# Patient Record
Sex: Female | Born: 1945 | Race: White | Hispanic: No | Smoking: Never smoker
Health system: Southern US, Community
[De-identification: ages and names within clinical notes are randomized; demographics above are authoritative.]

## PROBLEM LIST (undated history)

## (undated) DIAGNOSIS — I10 Essential (primary) hypertension: Secondary | ICD-10-CM

## (undated) DIAGNOSIS — F0390 Unspecified dementia without behavioral disturbance: Secondary | ICD-10-CM

## (undated) DIAGNOSIS — M5410 Radiculopathy, site unspecified: Secondary | ICD-10-CM

## (undated) DIAGNOSIS — G919 Hydrocephalus, unspecified: Secondary | ICD-10-CM

## (undated) DIAGNOSIS — M1990 Unspecified osteoarthritis, unspecified site: Secondary | ICD-10-CM

## (undated) DIAGNOSIS — N3281 Overactive bladder: Secondary | ICD-10-CM

## (undated) DIAGNOSIS — R45851 Suicidal ideations: Secondary | ICD-10-CM

## (undated) DIAGNOSIS — G4700 Insomnia, unspecified: Secondary | ICD-10-CM

## (undated) DIAGNOSIS — F419 Anxiety disorder, unspecified: Secondary | ICD-10-CM

## (undated) DIAGNOSIS — E538 Deficiency of other specified B group vitamins: Secondary | ICD-10-CM

## (undated) DIAGNOSIS — E559 Vitamin D deficiency, unspecified: Secondary | ICD-10-CM

## (undated) DIAGNOSIS — E785 Hyperlipidemia, unspecified: Secondary | ICD-10-CM

## (undated) DIAGNOSIS — E119 Type 2 diabetes mellitus without complications: Secondary | ICD-10-CM

## (undated) DIAGNOSIS — R001 Bradycardia, unspecified: Secondary | ICD-10-CM

## (undated) DIAGNOSIS — W19XXXA Unspecified fall, initial encounter: Secondary | ICD-10-CM

## (undated) DIAGNOSIS — J42 Unspecified chronic bronchitis: Secondary | ICD-10-CM

## (undated) DIAGNOSIS — I341 Nonrheumatic mitral (valve) prolapse: Secondary | ICD-10-CM

## (undated) DIAGNOSIS — R296 Repeated falls: Secondary | ICD-10-CM

## (undated) DIAGNOSIS — E7800 Pure hypercholesterolemia, unspecified: Secondary | ICD-10-CM

## (undated) DIAGNOSIS — I959 Hypotension, unspecified: Secondary | ICD-10-CM

## (undated) DIAGNOSIS — R42 Dizziness and giddiness: Secondary | ICD-10-CM

## (undated) DIAGNOSIS — K219 Gastro-esophageal reflux disease without esophagitis: Secondary | ICD-10-CM

## (undated) DIAGNOSIS — F329 Major depressive disorder, single episode, unspecified: Secondary | ICD-10-CM

## (undated) DIAGNOSIS — F32A Depression, unspecified: Secondary | ICD-10-CM

## (undated) DIAGNOSIS — R7401 Elevation of levels of liver transaminase levels: Secondary | ICD-10-CM

## (undated) DIAGNOSIS — R740 Nonspecific elevation of levels of transaminase and lactic acid dehydrogenase [LDH]: Secondary | ICD-10-CM

## (undated) DIAGNOSIS — N179 Acute kidney failure, unspecified: Secondary | ICD-10-CM

## (undated) HISTORY — DX: Major depressive disorder, single episode, unspecified: F32.9

## (undated) HISTORY — DX: Depression, unspecified: F32.A

## (undated) HISTORY — DX: Pure hypercholesterolemia, unspecified: E78.00

## (undated) HISTORY — DX: Unspecified chronic bronchitis: J42

## (undated) HISTORY — PX: VENTRICULOPERITONEAL SHUNT: SHX204

## (undated) HISTORY — DX: Anxiety disorder, unspecified: F41.9

## (undated) HISTORY — PX: CHOLECYSTECTOMY: SHX55

## (undated) HISTORY — DX: Essential (primary) hypertension: I10

## (undated) HISTORY — DX: Type 2 diabetes mellitus without complications: E11.9

## (undated) HISTORY — PX: BREAST BIOPSY: SHX20

## (undated) HISTORY — PX: BRAIN SURGERY: SHX531

## (undated) HISTORY — DX: Vitamin D deficiency, unspecified: E55.9

## (undated) HISTORY — DX: Unspecified osteoarthritis, unspecified site: M19.90

## (undated) HISTORY — DX: Insomnia, unspecified: G47.00

## (undated) HISTORY — DX: Hyperlipidemia, unspecified: E78.5

## (undated) HISTORY — DX: Unspecified dementia, unspecified severity, without behavioral disturbance, psychotic disturbance, mood disturbance, and anxiety: F03.90

## (undated) HISTORY — DX: Overactive bladder: N32.81

## (undated) HISTORY — DX: Suicidal ideations: R45.851

## (undated) HISTORY — DX: Deficiency of other specified B group vitamins: E53.8

## (undated) HISTORY — DX: Hydrocephalus, unspecified: G91.9

## (undated) HISTORY — DX: Radiculopathy, site unspecified: M54.10

---

## 2001-02-27 ENCOUNTER — Encounter: Payer: Self-pay | Admitting: Family Medicine

## 2001-02-27 ENCOUNTER — Encounter: Admission: RE | Admit: 2001-02-27 | Discharge: 2001-02-27 | Payer: Self-pay | Admitting: Family Medicine

## 2004-04-07 ENCOUNTER — Emergency Department (HOSPITAL_COMMUNITY): Admission: EM | Admit: 2004-04-07 | Discharge: 2004-04-08 | Payer: Self-pay | Admitting: Emergency Medicine

## 2004-04-08 ENCOUNTER — Inpatient Hospital Stay (HOSPITAL_COMMUNITY): Admission: EM | Admit: 2004-04-08 | Discharge: 2004-04-13 | Payer: Self-pay | Admitting: Emergency Medicine

## 2004-04-16 ENCOUNTER — Encounter: Admission: RE | Admit: 2004-04-16 | Discharge: 2004-04-16 | Payer: Self-pay | Admitting: Gastroenterology

## 2004-05-14 ENCOUNTER — Encounter (INDEPENDENT_AMBULATORY_CARE_PROVIDER_SITE_OTHER): Payer: Self-pay | Admitting: *Deleted

## 2004-05-14 ENCOUNTER — Ambulatory Visit (HOSPITAL_COMMUNITY): Admission: RE | Admit: 2004-05-14 | Discharge: 2004-05-17 | Payer: Self-pay | Admitting: General Surgery

## 2004-06-19 ENCOUNTER — Other Ambulatory Visit: Admission: RE | Admit: 2004-06-19 | Discharge: 2004-06-19 | Payer: Self-pay | Admitting: Obstetrics & Gynecology

## 2004-06-24 ENCOUNTER — Encounter: Admission: RE | Admit: 2004-06-24 | Discharge: 2004-06-24 | Payer: Self-pay | Admitting: Obstetrics & Gynecology

## 2004-08-17 ENCOUNTER — Ambulatory Visit (HOSPITAL_COMMUNITY): Admission: RE | Admit: 2004-08-17 | Discharge: 2004-08-17 | Payer: Self-pay | Admitting: Gastroenterology

## 2004-08-21 ENCOUNTER — Observation Stay (HOSPITAL_COMMUNITY): Admission: EM | Admit: 2004-08-21 | Discharge: 2004-08-22 | Payer: Self-pay | Admitting: Emergency Medicine

## 2004-08-21 ENCOUNTER — Ambulatory Visit: Payer: Self-pay | Admitting: Internal Medicine

## 2004-08-31 ENCOUNTER — Ambulatory Visit (HOSPITAL_COMMUNITY): Admission: RE | Admit: 2004-08-31 | Discharge: 2004-08-31 | Payer: Self-pay | Admitting: Gastroenterology

## 2004-09-09 ENCOUNTER — Encounter: Admission: RE | Admit: 2004-09-09 | Discharge: 2004-09-09 | Payer: Self-pay | Admitting: Gastroenterology

## 2004-10-01 LAB — HM DEXA SCAN

## 2005-07-05 ENCOUNTER — Other Ambulatory Visit: Admission: RE | Admit: 2005-07-05 | Discharge: 2005-07-05 | Payer: Self-pay | Admitting: Obstetrics & Gynecology

## 2007-07-03 LAB — CONVERTED CEMR LAB

## 2007-08-24 LAB — HM PAP SMEAR: HM Pap smear: NORMAL

## 2007-11-06 ENCOUNTER — Other Ambulatory Visit (HOSPITAL_COMMUNITY): Admission: RE | Admit: 2007-11-06 | Discharge: 2007-12-01 | Payer: Self-pay | Admitting: Psychiatry

## 2007-11-08 ENCOUNTER — Ambulatory Visit: Payer: Self-pay | Admitting: Psychiatry

## 2008-02-03 ENCOUNTER — Emergency Department (HOSPITAL_COMMUNITY): Admission: EM | Admit: 2008-02-03 | Discharge: 2008-02-03 | Payer: Self-pay | Admitting: Emergency Medicine

## 2008-04-10 ENCOUNTER — Emergency Department (HOSPITAL_COMMUNITY): Admission: EM | Admit: 2008-04-10 | Discharge: 2008-04-11 | Payer: Self-pay | Admitting: Emergency Medicine

## 2008-04-11 ENCOUNTER — Inpatient Hospital Stay (HOSPITAL_COMMUNITY): Admission: EM | Admit: 2008-04-11 | Discharge: 2008-04-13 | Payer: Self-pay | Admitting: Emergency Medicine

## 2008-05-15 ENCOUNTER — Ambulatory Visit: Payer: Self-pay | Admitting: Internal Medicine

## 2008-05-15 DIAGNOSIS — F3289 Other specified depressive episodes: Secondary | ICD-10-CM | POA: Insufficient documentation

## 2008-05-15 DIAGNOSIS — F329 Major depressive disorder, single episode, unspecified: Secondary | ICD-10-CM | POA: Insufficient documentation

## 2008-05-15 DIAGNOSIS — I10 Essential (primary) hypertension: Secondary | ICD-10-CM | POA: Insufficient documentation

## 2008-05-15 DIAGNOSIS — F419 Anxiety disorder, unspecified: Secondary | ICD-10-CM | POA: Insufficient documentation

## 2008-05-20 ENCOUNTER — Telehealth: Payer: Self-pay | Admitting: Internal Medicine

## 2009-01-25 ENCOUNTER — Ambulatory Visit: Payer: Self-pay | Admitting: Internal Medicine

## 2009-01-25 ENCOUNTER — Observation Stay (HOSPITAL_COMMUNITY): Admission: EM | Admit: 2009-01-25 | Discharge: 2009-01-26 | Payer: Self-pay | Admitting: Emergency Medicine

## 2009-04-17 ENCOUNTER — Emergency Department (HOSPITAL_COMMUNITY): Admission: EM | Admit: 2009-04-17 | Discharge: 2009-04-17 | Payer: Self-pay | Admitting: Emergency Medicine

## 2009-04-25 ENCOUNTER — Emergency Department (HOSPITAL_COMMUNITY): Admission: EM | Admit: 2009-04-25 | Discharge: 2009-04-25 | Payer: Self-pay | Admitting: Emergency Medicine

## 2009-04-27 ENCOUNTER — Emergency Department (HOSPITAL_COMMUNITY): Admission: EM | Admit: 2009-04-27 | Discharge: 2009-04-28 | Payer: Self-pay | Admitting: Emergency Medicine

## 2009-04-28 ENCOUNTER — Inpatient Hospital Stay (HOSPITAL_COMMUNITY): Admission: RE | Admit: 2009-04-28 | Discharge: 2009-04-29 | Payer: Self-pay | Admitting: Psychiatry

## 2009-04-28 ENCOUNTER — Ambulatory Visit: Payer: Self-pay | Admitting: Psychiatry

## 2009-04-28 ENCOUNTER — Emergency Department (HOSPITAL_COMMUNITY): Admission: EM | Admit: 2009-04-28 | Discharge: 2009-04-28 | Payer: Self-pay | Admitting: Emergency Medicine

## 2009-04-30 ENCOUNTER — Inpatient Hospital Stay (HOSPITAL_COMMUNITY): Admission: AD | Admit: 2009-04-30 | Discharge: 2009-05-08 | Payer: Self-pay | Admitting: Psychiatry

## 2009-05-09 ENCOUNTER — Other Ambulatory Visit (HOSPITAL_COMMUNITY): Admission: RE | Admit: 2009-05-09 | Discharge: 2009-05-22 | Payer: Self-pay | Admitting: Psychiatry

## 2009-05-14 ENCOUNTER — Ambulatory Visit: Payer: Self-pay | Admitting: Psychiatry

## 2009-07-22 ENCOUNTER — Emergency Department (HOSPITAL_COMMUNITY): Admission: EM | Admit: 2009-07-22 | Discharge: 2009-07-22 | Payer: Self-pay | Admitting: Emergency Medicine

## 2009-11-26 ENCOUNTER — Emergency Department (HOSPITAL_COMMUNITY): Admission: EM | Admit: 2009-11-26 | Discharge: 2009-11-27 | Payer: Self-pay | Admitting: Emergency Medicine

## 2009-12-01 ENCOUNTER — Inpatient Hospital Stay (HOSPITAL_COMMUNITY): Admission: RE | Admit: 2009-12-01 | Discharge: 2009-12-04 | Payer: Self-pay | Admitting: Orthopedic Surgery

## 2010-05-22 ENCOUNTER — Telehealth: Payer: Self-pay | Admitting: Internal Medicine

## 2010-05-22 ENCOUNTER — Ambulatory Visit: Payer: Self-pay | Admitting: Internal Medicine

## 2010-05-22 ENCOUNTER — Encounter: Payer: Self-pay | Admitting: Internal Medicine

## 2010-05-22 DIAGNOSIS — R55 Syncope and collapse: Secondary | ICD-10-CM | POA: Insufficient documentation

## 2010-05-22 DIAGNOSIS — R279 Unspecified lack of coordination: Secondary | ICD-10-CM | POA: Insufficient documentation

## 2010-05-22 LAB — CONVERTED CEMR LAB
ALT: 17 units/L (ref 0–35)
AST: 19 units/L (ref 0–37)
Albumin: 3.7 g/dL (ref 3.5–5.2)
Alkaline Phosphatase: 91 units/L (ref 39–117)
BUN: 18 mg/dL (ref 6–23)
Basophils Absolute: 0.1 10*3/uL (ref 0.0–0.1)
Basophils Relative: 0.8 % (ref 0.0–3.0)
Bilirubin Urine: NEGATIVE
Bilirubin, Direct: 0.1 mg/dL (ref 0.0–0.3)
CO2: 30 meq/L (ref 19–32)
Calcium: 9.2 mg/dL (ref 8.4–10.5)
Chloride: 103 meq/L (ref 96–112)
Creatinine, Ser: 0.7 mg/dL (ref 0.4–1.2)
Eosinophils Absolute: 0.2 10*3/uL (ref 0.0–0.7)
Eosinophils Relative: 3.4 % (ref 0.0–5.0)
Folate: 6.3 ng/mL
GFR calc non Af Amer: 91.25 mL/min (ref >60)
Glucose, Bld: 116 mg/dL — ABNORMAL HIGH (ref 70–99)
HCT: 43.6 % (ref 36.0–46.0)
Hemoglobin, Urine: NEGATIVE
Hemoglobin: 15.3 g/dL — ABNORMAL HIGH (ref 12.0–15.0)
Ketones, ur: NEGATIVE mg/dL
Leukocytes, UA: NEGATIVE
Lymphocytes Relative: 20.9 % (ref 12.0–46.0)
Lymphs Abs: 1.5 10*3/uL (ref 0.7–4.0)
MCHC: 35.1 g/dL (ref 30.0–36.0)
MCV: 90.7 fL (ref 78.0–100.0)
Monocytes Absolute: 0.6 10*3/uL (ref 0.1–1.0)
Monocytes Relative: 8 % (ref 3.0–12.0)
Neutro Abs: 4.8 10*3/uL (ref 1.4–7.7)
Neutrophils Relative %: 66.9 % (ref 43.0–77.0)
Nitrite: NEGATIVE
Platelets: 269 10*3/uL (ref 150.0–400.0)
Potassium: 4.3 meq/L (ref 3.5–5.1)
RBC: 4.81 M/uL (ref 3.87–5.11)
RDW: 12.8 % (ref 11.5–14.6)
Sodium: 141 meq/L (ref 135–145)
Specific Gravity, Urine: 1.02 (ref 1.000–1.030)
TSH: 2.86 microintl units/mL (ref 0.35–5.50)
Total Bilirubin: 0.4 mg/dL (ref 0.3–1.2)
Total Protein, Urine: NEGATIVE mg/dL
Total Protein: 7 g/dL (ref 6.0–8.3)
Urine Glucose: NEGATIVE mg/dL
Urobilinogen, UA: 0.2 (ref 0.0–1.0)
Vitamin B-12: 172 pg/mL — ABNORMAL LOW (ref 211–911)
WBC: 7.2 10*3/uL (ref 4.5–10.5)
pH: 6 (ref 5.0–8.0)

## 2010-05-25 ENCOUNTER — Telehealth: Payer: Self-pay | Admitting: Internal Medicine

## 2010-06-01 DIAGNOSIS — K869 Disease of pancreas, unspecified: Secondary | ICD-10-CM | POA: Insufficient documentation

## 2010-06-01 DIAGNOSIS — F410 Panic disorder [episodic paroxysmal anxiety] without agoraphobia: Secondary | ICD-10-CM | POA: Insufficient documentation

## 2010-06-01 DIAGNOSIS — R079 Chest pain, unspecified: Secondary | ICD-10-CM | POA: Insufficient documentation

## 2010-06-02 ENCOUNTER — Ambulatory Visit: Payer: Self-pay | Admitting: Cardiovascular Disease

## 2010-06-02 DIAGNOSIS — R9431 Abnormal electrocardiogram [ECG] [EKG]: Secondary | ICD-10-CM | POA: Insufficient documentation

## 2010-06-03 ENCOUNTER — Telehealth: Payer: Self-pay | Admitting: Internal Medicine

## 2010-06-03 ENCOUNTER — Ambulatory Visit (HOSPITAL_COMMUNITY): Admission: RE | Admit: 2010-06-03 | Discharge: 2010-06-03 | Payer: Self-pay | Admitting: Internal Medicine

## 2010-06-03 DIAGNOSIS — G910 Communicating hydrocephalus: Secondary | ICD-10-CM | POA: Insufficient documentation

## 2010-06-05 ENCOUNTER — Ambulatory Visit: Payer: Self-pay | Admitting: Internal Medicine

## 2010-06-05 DIAGNOSIS — D519 Vitamin B12 deficiency anemia, unspecified: Secondary | ICD-10-CM | POA: Insufficient documentation

## 2010-06-10 ENCOUNTER — Encounter: Payer: Self-pay | Admitting: Internal Medicine

## 2010-06-12 ENCOUNTER — Encounter: Payer: Self-pay | Admitting: Internal Medicine

## 2010-06-16 ENCOUNTER — Ambulatory Visit (HOSPITAL_COMMUNITY): Admission: RE | Admit: 2010-06-16 | Discharge: 2010-06-16 | Payer: Self-pay | Admitting: Cardiology

## 2010-06-19 ENCOUNTER — Ambulatory Visit: Payer: Self-pay | Admitting: Internal Medicine

## 2010-06-29 ENCOUNTER — Telehealth: Payer: Self-pay | Admitting: Internal Medicine

## 2010-07-06 ENCOUNTER — Inpatient Hospital Stay (HOSPITAL_COMMUNITY)
Admission: RE | Admit: 2010-07-06 | Discharge: 2010-07-08 | Payer: Self-pay | Source: Home / Self Care | Attending: Neurosurgery | Admitting: Neurosurgery

## 2010-07-17 ENCOUNTER — Encounter: Payer: Self-pay | Admitting: Internal Medicine

## 2010-08-11 ENCOUNTER — Encounter: Payer: Self-pay | Admitting: Internal Medicine

## 2010-08-22 ENCOUNTER — Encounter: Payer: Self-pay | Admitting: Gastroenterology

## 2010-08-22 ENCOUNTER — Other Ambulatory Visit: Payer: Self-pay | Admitting: Neurosurgery

## 2010-08-22 DIAGNOSIS — G919 Hydrocephalus, unspecified: Secondary | ICD-10-CM

## 2010-09-03 NOTE — Assessment & Plan Note (Signed)
Summary: b12 shot / [name] / cd  Nurse Visit   Allergies: 1)  ! Pcn 2)  ! Erythromycin 3)  ! Amitriptyline Hcl  Medication Administration  Injection # 1:    Medication: Vit B12 1000 mcg    Diagnosis: VITAMIN B12 DEFICIENCY (ICD-266.2)    Route: IM    Site: R deltoid    Exp Date: 11/01/2011    Lot #: [id number]    Mfr: American Regent    Patient tolerated injection without complications    Given by: Lanier Prude, CMA(AAMA) (June 19, 2010 1:18 PM)  Orders Added: 1)  Vit B12 1000 mcg [J3420] 2)  Admin of Therapeutic Inj  intramuscular or subcutaneous [16109]

## 2010-09-03 NOTE — Letter (Signed)
Summary: [facility]  [facility]   Imported By: Lester Fruitland 07/31/2010 07:23:55  _____________________________________________________________________  External Attachment:    Type:   Image     Comment:   External Document

## 2010-09-03 NOTE — Assessment & Plan Note (Signed)
Summary: discuss test results/#/cd   Vital Signs:  Patient profile:   65 year old female Menstrual status:  postmenopausal Height:      67 inches Weight:      252 pounds BMI:     39.61 O2 Sat:      97 % on Room air Temp:     97.8 degrees F oral Pulse rate:   90 / minute Pulse rhythm:   regular Resp:     16 per minute BP sitting:   124 / 80  (left arm) Cuff size:   large  Vitals Entered By: Rock Nephew CMA (June 05, 2010 11:22 AM)  Nutrition Counseling: Patient's BMI is greater than 25 and therefore counseled on weight management options.  O2 Flow:  Room air CC: follow-up visit/// test results Is Patient Diabetic? No Pain Assessment Patient in pain? no       Does patient need assistance? Functional Status Self care Ambulation Normal   Primary Care Provider:  Etta Grandchild MD  CC:  follow-up visit/// test results.  History of Present Illness: She returns for f/up and to discusss her results. 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 She conintinues to have ataxia and feels off balance.  Preventive Screening-Counseling & Management  Alcohol-Tobacco     Alcohol drinks/day: 0     Alcohol Counseling: not indicated; patient does not drink     Smoking Status: never     Passive Smoke Exposure: no     Tobacco Counseling: not indicated; no tobacco use  Hep-HIV-STD-Contraception     Hepatitis Risk: no risk noted     HIV Risk: no risk noted     STD Risk: no risk noted      Sexual History:  not active.        Drug Use:  never.        Blood Transfusions:  no.    Clinical Review Panels:  Prevention   Last Mammogram:  done (10/01/2007)   Last Pap Smear:  done (07/03/2007)  Immunizations   Last Tetanus Booster:  Tdap (05/22/2010)   Last Flu Vaccine:  Fluvax 3+ (05/22/2010)  Diabetes Management   Creatinine:  0.7 (05/22/2010)   Last Flu Vaccine:  Fluvax 3+ (05/22/2010)  CBC   WBC:  7.2 (05/22/2010)   RBC:   4.81 (05/22/2010)   Hgb:  15.3 (05/22/2010)   Hct:  43.6 (05/22/2010)   Platelets:  269.0 (05/22/2010)   MCV  90.7 (05/22/2010)   MCHC  35.1 (05/22/2010)   RDW  12.8 (05/22/2010)   PMN:  66.9 (05/22/2010)   Lymphs:  20.9 (05/22/2010)   Monos:  8.0 (05/22/2010)   Eosinophils:  3.4 (05/22/2010)   Basophil:  0.8 (05/22/2010)  Complete Metabolic Panel   Glucose:  116 (05/22/2010)   Sodium:  141 (05/22/2010)   Potassium:  4.3 (05/22/2010)   Chloride:  103 (05/22/2010)   CO2:  30 (05/22/2010)   BUN:  18 (05/22/2010)   Creatinine:  0.7 (05/22/2010)   Albumin:  3.7 (05/22/2010)   Total Protein:  7.0 (05/22/2010)   Calcium:  9.2 (05/22/2010)   Total Bili:  0.4 (05/22/2010)   Alk Phos:  91 (05/22/2010)   SGPT (ALT):  17 (05/22/2010)   SGOT (AST):  19 (05/22/2010)   Medications Prior to Update: 1)  Lorazepam 3  Mg Tabs (Lorazepam) .... Take 1 Tablet By Mouth Once A Day 2)  Viibryd 10 Mg Tabs (Vilazodone Hcl) .... 10mg   By Mouth Weekly 3)  Diovan 320 Mg Tabs (Valsartan) .... One By Mouth Once Daily For High Blood Pressure 4)  Ibuprofen .... As Needed  Current Medications (verified): 1)  Lorazepam 1 Mg Tabs (Lorazepam) .... Take 1 Tablet By Mouth Three Times A Day 2)  Viibryd 40 Mg Tabs (Vilazodone Hcl) .... Take 1 Tablet By Mouth Once A Day 3)  Diovan 320 Mg Tabs (Valsartan) .... One By Mouth Once Daily For High Blood Pressure 4)  Ibuprofen .... As Needed 5)  Fiorinal/codeine #3 50-325-40-30 Mg Caps (Butalbital-Asa-Caff-Codeine) .... As Needed  Allergies (verified): 1)  ! Pcn 2)  ! Erythromycin 3)  ! Amitriptyline Hcl  Past History:  Past Medical History: Last updated: 06/01/2010 CHEST PAIN HYPERTENSION  GALLSTONE PANCREATITIS  PANIC ATTACK  SYNCOPE ATAXIA  FAMILY HISTORY DIABETES 1ST DEGREE RELATIVE  FAMILY HISTORY OF ALCOHOLISM/ADDICTION  ANXIETY  Depression Dr Evelene Croon  Past Surgical History: Last updated: 06/01/2010 Cholecystectomy  Right wrist intraarticular  distal radius fracture.   Family History: Last updated: 05/15/2008 Family History of Alcoholism/Addiction Family History Diabetes 1st degree relative Family History Hypertension Family History of Stroke M 1st degree relative <50 Family History of Suicide attempt  Social History: Last updated: 05/15/2008 Retired Divorced Never Smoked Alcohol use-no Drug use-no Regular exercise-no  Risk Factors: Alcohol Use: 0 (06/05/2010) Exercise: no (05/15/2008)  Risk Factors: Smoking Status: never (06/05/2010) Passive Smoke Exposure: no (06/05/2010)  Family History: Reviewed history from 05/15/2008 and no changes required. Family History of Alcoholism/Addiction Family History Diabetes 1st degree relative Family History Hypertension Family History of Stroke M 1st degree relative <50 Family History of Suicide attempt  Social History: Reviewed history from 05/15/2008 and no changes required. Retired Divorced Never Smoked Alcohol use-no Drug use-no Regular exercise-no  Review of Systems       The patient complains of weight gain, syncope, headaches, and difficulty walking.  The patient denies anorexia, fever, weight loss, chest pain, dyspnea on exertion, peripheral edema, prolonged cough, abdominal pain, transient blindness, and depression.   Neuro:  Complains of difficulty with concentration, disturbances in coordination, falling down, headaches, memory loss, poor balance, and tingling; denies brief paralysis, inability to speak, numbness, seizures, sensation of room spinning, tremors, visual disturbances, and weakness. Psych:  Complains of anxiety; denies depression, easily angered, easily tearful, irritability, mental problems, panic attacks, sense of great danger, suicidal thoughts/plans, thoughts of violence, unusual visions or sounds, and thoughts /plans of harming others.  Physical Exam  General:  alert, well-developed, well-nourished, well-hydrated, appropriate dress, normal  appearance, healthy-appearing, cooperative to examination, and overweight-appearing.   Head:  normocephalic, atraumatic, no abnormalities observed, and no abnormalities palpated.   Mouth:  Oral mucosa and oropharynx without lesions or exudates.  Teeth in good repair. Neck:  supple, full ROM, no masses, no thyromegaly, no JVD, normal carotid upstroke, no carotid bruits, no cervical lymphadenopathy, and no neck tenderness.   Lungs:  normal respiratory effort, no intercostal retractions, no accessory muscle use, normal breath sounds, no dullness, no fremitus, no crackles, and no wheezes.   Heart:  normal rate, regular rhythm, no murmur, no gallop, no rub, and no JVD.   Abdomen:  soft, non-tender, normal bowel sounds, no distention, no masses, no guarding, no rigidity, no rebound tenderness, no abdominal hernia, no inguinal hernia, no hepatomegaly, and no splenomegaly.   Msk:  normal ROM, no joint tenderness, no joint swelling, no joint warmth, no redness over joints, no joint deformities, no joint instability, no crepitation, and no muscle atrophy.  Pulses:  R and L carotid,radial,femoral,dorsalis pedis and posterior tibial pulses are full and equal bilaterally Extremities:  trace left pedal edema and trace right pedal edema.   Neurologic:  alert & oriented X3, cranial nerves II-XII intact, strength normal in all extremities, sensation intact to light touch, sensation intact to pinprick, DTRs symmetrical and normal, finger-to-nose normal, Romberg negative, and ataxic gait.   Skin:  turgor normal, color normal, no rashes, no suspicious lesions, no ecchymoses, no petechiae, no purpura, no ulcerations, and no edema.   Cervical Nodes:  no anterior cervical adenopathy and no posterior cervical adenopathy.   Axillary Nodes:  no R axillary adenopathy and no L axillary adenopathy.   Psych:  Oriented X3, memory intact for recent and remote, normally interactive, good eye contact, not depressed appearing, not  agitated, not suicidal, not homicidal, and slightly anxious.     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 Take 1 tablet by mouth three times a day 2)  Viibryd 40 Mg Tabs (Vilazodone hcl) .... Take 1 tablet by mouth once a day 3)  Diovan 320 Mg Tabs (Valsartan) .... One by mouth once daily for high blood pressure 4)  Ibuprofen  .... As needed 5)  Fiorinal/codeine #3 50-325-40-30 Mg Caps (Butalbital-asa-caff-codeine) .... As needed  Patient Instructions: 1)  Please schedule a follow-up appointment in 2 weeks for your second B12 injection. 2)  Check your Blood Pressure regularly. If it is above 140/90: you should make an appointment.   Medication Administration  Injection # 1:    Medication: Vit B12 1000 mcg    Diagnosis: VITAMIN B12 DEFICIENCY (ICD-266.2)    Route: IM    Site: L deltoid    Exp Date: 11/2011    Lot #: [id number]    Mfr: American Regent    Patient tolerated injection without complications    Given by: Rock Nephew CMA (June 05, 2010 11:20 AM)  Orders Added: 1)  Admin of Therapeutic Inj  intramuscular or subcutaneous [96372] 2)  Vit B12 1000 mcg [J3420] 3)  Est. Patient Level V  [09811]     Medication Administration  Injection # 1:    Medication: Vit B12 1000 mcg    Diagnosis: VITAMIN B12 DEFICIENCY (ICD-266.2)    Route: IM    Site: L deltoid    Exp Date: 11/2011    Lot #: [id number]    Mfr: American Regent    Patient tolerated injection without complications    Given by: Rock Nephew CMA (June 05, 2010 11:20 AM)  Orders Added: 1)  Admin of Therapeutic Inj  intramuscular or subcutaneous [96372] 2)  Vit B12 1000 mcg [J3420] 3)  Est. Patient Level V [91478]

## 2010-09-03 NOTE — Letter (Signed)
Summary: Results Follow-up Letter  [facility]  [address] [place], [place] [address]   Phone: [phone]  Fax: [phone]    05/22/2010  [address] [place], [place]  [address]  Dear Ms. [facility],   The following are the results of your recent test(s):  Test     Result     B12        low CBC       normal Blood sugar     slightly elevated Liver/kidney   normal Thyroid     normal Urine       normal  _________________________________________________________  Please call for an appointment soon _________________________________________________________ _________________________________________________________ _________________________________________________________  Sincerely,  Sanda Linger MD [facility]

## 2010-09-03 NOTE — Progress Notes (Signed)
Summary: rx request  Phone Note Refill Request Message from:  Fax from Pharmacy on May 20, 2008 11:01 AM  Refills Requested: Medication #1:  FIORINAL/CODEINE #3 50-325-40-30 MG CAPS as needed   Dosage confirmed as above?Dosage Confirmed   Brand Name Necessary? No   Supply Requested: 1 month [facility]  Initial call taken by: Rock Nephew CMA,  May 20, 2008 11:02 AM  Follow-up for Phone Call        rx called in Follow-up by: Rock Nephew CMA,  May 20, 2008 11:20 AM      Prescriptions: FIORINAL/CODEINE #3 50-325-40-30 MG CAPS (BUTALBITAL-ASA-CAFF-CODEINE) as needed  #0 x 0   Entered and Authorized by:   Etta Grandchild MD   Signed by:   Etta Grandchild MD on 05/20/2008   Method used:   Print then Give to Patient   RxID:   [id number]

## 2010-09-03 NOTE — Progress Notes (Signed)
Summary: Diovan RF  Phone Note Call from Patient Call back at Home Phone [phone]   Summary of Call: Patient is requesting rx for Diovan. Unsure what pharm Initial call taken by: Lamar Sprinkles, CMA,  June 29, 2010 12:00 PM    Prescriptions: DIOVAN 320 MG TABS (VALSARTAN) One by mouth once daily for high blood pressure  #90 x 1   Entered by:   Lamar Sprinkles, CMA   Authorized by:   Etta Grandchild MD   Signed by:   Lamar Sprinkles, CMA on 06/29/2010   Method used:   [facility]        [facility] [address] [phone][facility] (retail)       [address]       [place], [place]  [address]       Ph: [phone] or [phone]       Fax: [phone]   RxID:   [id number]

## 2010-09-03 NOTE — Consult Note (Signed)
Summary: [facility]   Imported By: Sherian Rein 07/01/2010 14:25:52  _____________________________________________________________________  External Attachment:    Type:   Image     Comment:   External Document

## 2010-09-03 NOTE — Miscellaneous (Signed)
Summary: Appointment No Show  Appointment status changed to no show by [employer] on 06/12/2010 4:01 PM.  No Show Comments ---------------- [facility]/ABNORMAL EKG/794.31/DM  Appointment Information ----------------------- Appt Type:  CARDIOLOGY ANCILLARY VISIT      Date:  Friday, June 12, 2010      Time:  3:00 PM for 60 min   Urgency:  Routine   Made By:  Pearson Grippe  To Visit:  [id number]    Reason:  ECH/ABNORMAL EKG/794.31/DM  Appt Comments ------------- -- 06/12/10 16:01: ([facility]) NO SHOW -- [facility]/ABNORMAL EKG/794.31/DM -- 06/02/10 15:09: ([facility]) BOOKED -- Routine CARDIOLOGY ANCILLARY VISIT at 06/12/2010 3:00 PM for 60 min [facility]/ABNORMAL EKG/794.31/DM

## 2010-09-03 NOTE — Letter (Signed)
Summary: [facility]   Imported By: Sherian Rein 08/24/2010 09:28:20  _____________________________________________________________________  External Attachment:    Type:   Image     Comment:   External Document

## 2010-09-03 NOTE — Progress Notes (Signed)
Summary: MRI  Phone Note Call from Patient Call back at Home Phone [phone]   Summary of Call: Patient is requesting to know when her MRI will be ordered? I do not see order in EMR.  Initial call taken by: Lamar Sprinkles, CMA,  May 25, 2010 10:01 AM

## 2010-09-03 NOTE — Assessment & Plan Note (Signed)
Summary: ov-last appt:09-fell 12/14-knee bend problem-bladder control ...   Vital Signs:  Patient profile:   65 year old female Menstrual status:  postmenopausal Height:      67 inches Weight:      259.56 pounds BMI:     40.80 O2 Sat:      94 % on Room air Temp:     98.8 degrees F oral Pulse rate:   88 / minute Pulse rhythm:   regular Resp:     16 per minute BP sitting:   150 / 90  (left arm)  Vitals Entered By: Jarome Lamas (May 22, 2010 9:49 AM)  Nutrition Counseling: Patient's BMI is greater than 25 and therefore counseled on weight management options.  O2 Flow:  Room air CC: falls, Hypertension Management Is Patient Diabetic? No Pain Assessment Patient in pain? no          Menstrual Status postmenopausal Last PAP Result done   Primary Care Provider:  Etta Grandchild MD  CC:  falls and Hypertension Management.  History of Present Illness: She returns after a long absence and tells me that has had 3 bad syncopal episodes over the last 10 months. She has no warning but suddenly loses her balance and she falls so hard that she hit her head and lost consciousness in December 2010 and was taken to an ER and she had a compund fracture in her right wrist in April 2011 that required surgery. 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 She has not taken a BP med for over a year now. She reports mild ataxia in between the syncopal spells. She has been severely depressed over the last year and had a 10 day stay at [facility] in 2010 for suicidal ideaions. She sees Dr. Evelene Croon and though she still feels depressed she is not suicidal.  Hypertension History:      She complains of syncope, but denies headache, chest pain, palpitations, dyspnea with exertion, orthopnea, PND, peripheral edema, and visual symptoms.        Positive major cardiovascular risk factors include female age 23 years old or older and hypertension.  Negative major cardiovascular risk  factors include no history of diabetes or hyperlipidemia, negative family history for ischemic heart disease, and non-tobacco-user status.        Further assessment for target organ damage reveals no history of ASHD, cardiac end-organ damage (CHF/LVH), stroke/TIA, peripheral vascular disease, renal insufficiency, or hypertensive retinopathy.     Preventive Screening-Counseling & Management  Alcohol-Tobacco     Alcohol drinks/day: 0     Alcohol Counseling: not indicated; patient does not drink     Smoking Status: never     Passive Smoke Exposure: no     Tobacco Counseling: not indicated; no tobacco use  Hep-HIV-STD-Contraception     Hepatitis Risk: no risk noted     HIV Risk: no risk noted     STD Risk: no risk noted      Sexual History:  not active.        Drug Use:  never.        Blood Transfusions:  no.    Medications Prior to Update: 1)  Lorazepam 2 Mg Tabs (Lorazepam) .... Take 1 Tablet By Mouth Once A Day 2)  Bystolic 5 Mg Tabs (Nebivolol Hcl) .... Take 1 Tablet By Mouth Once A Day 3)  Fiorinal/codeine #3 50-325-40-30 Mg Caps (Butalbital-Asa-Caff-Codeine) .... As Needed 4)  Pristiq 50 Mg  Xr24h-Tab (Desvenlafaxine Succinate) .... Once Daily  Current Medications (verified): 1)  Lorazepam 2 Mg Tabs (Lorazepam) .... Take 1 Tablet By Mouth Once A Day 2)  Viibryd 10 Mg Tabs (Vilazodone Hcl) .... 10mg  By Mouth Weekly 3)  Diovan 320 Mg Tabs (Valsartan) .... One By Mouth Once Daily For High Blood Pressure  Allergies (verified): 1)  ! Pcn 2)  ! Erythromycin 3)  ! Amitriptyline Hcl  Past History:  Past Medical History: Last updated: 05/15/2008 Anxiety Depression Dr Evelene Croon Hypertension  Past Surgical History: Last updated: 05/15/2008 Cholecystectomy  Family History: Last updated: 05/15/2008 Family History of Alcoholism/Addiction Family History Diabetes 1st degree relative Family History Hypertension Family History of Stroke M 1st degree relative <50 Family History of  Suicide attempt  Social History: Last updated: 05/15/2008 Retired Divorced Never Smoked Alcohol use-no Drug use-no Regular exercise-no  Risk Factors: Alcohol Use: 0 (05/22/2010) Exercise: no (05/15/2008)  Risk Factors: Smoking Status: never (05/22/2010) Passive Smoke Exposure: no (05/22/2010)  Family History: Reviewed history from 05/15/2008 and no changes required. Family History of Alcoholism/Addiction Family History Diabetes 1st degree relative Family History Hypertension Family History of Stroke M 1st degree relative <50 Family History of Suicide attempt  Social History: Reviewed history from 05/15/2008 and no changes required. Retired Divorced Never Smoked Alcohol use-no Drug use-no Regular exercise-no Hepatitis Risk:  no risk noted HIV Risk:  no risk noted STD Risk:  no risk noted Sexual History:  not active Drug Use:  never Blood Transfusions:  no  Review of Systems       The patient complains of weight gain, difficulty walking, and depression.  The patient denies weight loss, chest pain, syncope, dyspnea on exertion, peripheral edema, prolonged cough, headaches, hemoptysis, abdominal pain, muscle weakness, suspicious skin lesions, transient blindness, unusual weight change, and enlarged lymph nodes.   CV:  Complains of fainting, fatigue, palpitations, and weight gain; denies chest pain or discomfort, difficulty breathing at night, difficulty breathing while lying down, leg cramps with exertion, lightheadness, near fainting, shortness of breath with exertion, and swelling of feet. Neuro:  Complains of falling down and poor balance; denies brief paralysis, difficulty with concentration, disturbances in coordination, headaches, inability to speak, memory loss, numbness, seizures, sensation of room spinning, tingling, tremors, visual disturbances, and weakness. Psych:  Complains of anxiety, depression, and easily tearful; denies easily angered, irritability, mental  problems, panic attacks, sense of great danger, suicidal thoughts/plans, thoughts of violence, unusual visions or sounds, and thoughts /plans of harming others. Endo:  Denies cold intolerance, excessive hunger, excessive thirst, excessive urination, heat intolerance, and polyuria.  Physical Exam  General:  alert, well-developed, well-nourished, well-hydrated, appropriate dress, normal appearance, healthy-appearing, cooperative to examination, and overweight-appearing.   Head:  normocephalic, atraumatic, no abnormalities observed, and no abnormalities palpated.   Eyes:  vision grossly intact, pupils equal, pupils round, pupils reactive to light, and pupils react to accomodation.   Mouth:  Oral mucosa and oropharynx without lesions or exudates.  Teeth in good repair. Neck:  supple, full ROM, no masses, no thyromegaly, no JVD, normal carotid upstroke, no carotid bruits, no cervical lymphadenopathy, and no neck tenderness.   Lungs:  normal respiratory effort, no intercostal retractions, no accessory muscle use, normal breath sounds, no dullness, no fremitus, no crackles, and no wheezes.   Heart:  normal rate, regular rhythm, no murmur, no gallop, no rub, and no JVD.   Abdomen:  soft, non-tender, normal bowel sounds, no distention, no masses, no guarding, no rigidity, no rebound tenderness, no abdominal  hernia, no inguinal hernia, no hepatomegaly, and no splenomegaly.   Msk:  normal ROM, no joint tenderness, no joint swelling, no joint warmth, no redness over joints, no joint deformities, no joint instability, no crepitation, and no muscle atrophy.   Pulses:  R and L carotid,radial,femoral,dorsalis pedis and posterior tibial pulses are full and equal bilaterally Extremities:  trace left pedal edema and trace right pedal edema.   Neurologic:  alert & oriented X3, cranial nerves II-XII intact, strength normal in all extremities, sensation intact to light touch, sensation intact to pinprick, DTRs symmetrical  and normal, finger-to-nose normal, Romberg negative, and ataxic gait.   Skin:  turgor normal, color normal, no rashes, no suspicious lesions, no ecchymoses, no petechiae, no purpura, no ulcerations, and no edema.   Cervical Nodes:  no anterior cervical adenopathy and no posterior cervical adenopathy.   Axillary Nodes:  no R axillary adenopathy and no L axillary adenopathy.   Psych:  Oriented X3, memory intact for recent and remote, normally interactive, good eye contact, not agitated, not suicidal, dysphoric affect, and tearful.   Additional Exam:  EKG shows LVH but the rhythm is normal.   Impression & Recommendations:  Problem # 1:  SYNCOPE (ICD-780.2) Assessment New I think she needs a cardiac work-up Orders: Venipuncture (16109) TLB-B12 + Folate Pnl (60454_09811-B14/NWG) TLB-BMP (Basic Metabolic Panel-BMET) (80048-METABOL) TLB-CBC Platelet - w/Differential (85025-CBCD) TLB-Hepatic/Liver Function Pnl (80076-HEPATIC) TLB-TSH (Thyroid Stimulating Hormone) (84443-TSH) TLB-Udip w/ Micro (81001-URINE) Cardiology Referral (Cardiology) EKG w/ Interpretation (93000)  Problem # 2:  ATAXIA (ICD-781.3) Assessment: New will scan her brain to look for NPH, CVA, atrophy, MS, etc Orders: Venipuncture (95621) TLB-B12 + Folate Pnl ([id number]-G29/BMW) TLB-BMP (Basic Metabolic Panel-BMET) (80048-METABOL) TLB-CBC Platelet - w/Differential (85025-CBCD) TLB-Hepatic/Liver Function Pnl (80076-HEPATIC) TLB-TSH (Thyroid Stimulating Hormone) (84443-TSH) TLB-Udip w/ Micro (81001-URINE)  Problem # 3:  HYPERTENSION (ICD-401.9) Assessment: Deteriorated  The following medications were removed from the medication list:    Bystolic 5 Mg Tabs (Nebivolol hcl) .Marland Kitchen... Take 1 tablet by mouth once a day Her updated medication list for this problem includes:    Diovan 320 Mg Tabs (Valsartan) ..... One by mouth once daily for high blood pressure  Orders: Venipuncture (41324) TLB-B12 + Folate Pnl  ([id number]-U44/IHK) TLB-BMP (Basic Metabolic Panel-BMET) (80048-METABOL) TLB-CBC Platelet - w/Differential (85025-CBCD) TLB-Hepatic/Liver Function Pnl (80076-HEPATIC) TLB-TSH (Thyroid Stimulating Hormone) (84443-TSH) TLB-Udip w/ Micro (81001-URINE) EKG w/ Interpretation (93000)  BP today: 150/90 Prior BP: 162/102 (05/15/2008)  Prior 10 Yr Risk Heart Disease: Not enough information (05/15/2008)  Complete Medication List: 1)  Lorazepam 2 Mg Tabs (Lorazepam) .... Take 1 tablet by mouth once a day 2)  Viibryd 10 Mg Tabs (Vilazodone hcl) .... 10mg  by mouth weekly 3)  Diovan 320 Mg Tabs (Valsartan) .... One by mouth once daily for high blood pressure  Other Orders: Admin 1st Vaccine (74259) Flu Vaccine 73yrs + (56387) Tdap => 4yrs IM (56433) Admin of Any Addtl Vaccine (29518)  Hypertension Assessment/Plan:      The patient's hypertensive risk group is category B: At least one risk factor (excluding diabetes) with no target organ damage.  Today's blood pressure is 150/90.  Her blood pressure goal is < 140/90.  Patient Instructions: 1)  Please schedule a follow-up appointment in 2 weeks. 2)  It is important that you exercise regularly at least 20 minutes 5 times a week. If you develop chest pain, have severe difficulty breathing, or feel very tired , stop exercising immediately and seek medical attention. 3)  You need to lose weight.  Consider a lower calorie diet and regular exercise.  4)  Check your Blood Pressure regularly. If it is above 130/80: you should make an appointment. Prescriptions: DIOVAN 320 MG TABS (VALSARTAN) One by mouth once daily for high blood pressure  #112 x 0   Entered and Authorized by:   Etta Grandchild MD   Signed by:   Etta Grandchild MD on 05/22/2010   Method used:   Samples Given   RxID:   [id number]    Orders Added: 1)  Venipuncture [36415] 2)  TLB-B12 + Folate Pnl [[id number]-B12/FOL] 3)  TLB-BMP (Basic Metabolic Panel-BMET)  [80048-METABOL] 4)  TLB-CBC Platelet - w/Differential [85025-CBCD] 5)  TLB-Hepatic/Liver Function Pnl [80076-HEPATIC] 6)  TLB-TSH (Thyroid Stimulating Hormone) [84443-TSH] 7)  TLB-Udip w/ Micro [81001-URINE] 8)  Cardiology Referral [Cardiology] 9)  Admin 1st Vaccine [90471] 10)  Flu Vaccine 18yrs + [62952] 11)  Tdap => 83yrs IM [90715] 12)  Admin of Any Addtl Vaccine [90472] 13)  EKG w/ Interpretation [93000] 14)  Est. Patient Level V [84132]   Immunizations Administered:  Tetanus Vaccine:    Vaccine Type: Tdap    Site: left deltoid    Mfr: GlaxoSmithKline    Dose: 0.5 ml    Route: IM    Given by: Jarome Lamas    Exp. Date: 05/21/2012    Lot #: [id number]    VIS given: 06/19/08 version given May 22, 2010.   Immunizations Administered:  Tetanus Vaccine:    Vaccine Type: Tdap    Site: left deltoid    Mfr: GlaxoSmithKline    Dose: 0.5 ml    Route: IM    Given by: Jarome Lamas    Exp. Date: 05/21/2012    Lot #: [id number]    VIS given: 06/19/08 version given May 22, 2010.  Flu Vaccine Consent Questions     Do you have a history of severe allergic reactions to this vaccine? no    Any prior history of allergic reactions to egg and/or gelatin? no    Do you have a sensitivity to the preservative Thimersol? no    Do you have a past history of Guillan-Barre Syndrome? no    Do you currently have an acute febrile illness? no    Have you ever had a severe reaction to latex? no    Vaccine information given and explained to patient? yes    Are you currently pregnant? no    Lot Number:[id number]   Exp Date:01/30/2011   Site Given  right Deltoid IMlbflu

## 2010-09-03 NOTE — Progress Notes (Signed)
Summary: Appt scheduled.  Phone Note Outgoing Call   Summary of Call: LA her MRI is abnormal , please get her to f/up with me soon , she'll need to see a neurosurgeon Initial call taken by: Etta Grandchild MD,  June 03, 2010 3:22 PM  Follow-up for Phone Call        lmovm for pt to call back and make appt.Alvy Beal Archie CMA  June 03, 2010 3:55 PM  Patient has appt scheduled for 11am today. Follow-up by: Daphane Shepherd,  June 05, 2010 8:38 AM  New Problems: HYDROCEPHALUS, NORMAL PRESSURE (ICD-331.3)   New Problems: HYDROCEPHALUS, NORMAL PRESSURE (ICD-331.3)

## 2010-09-03 NOTE — Assessment & Plan Note (Signed)
Summary: syncope/mt   Primary Provider:  Etta Grandchild MD  CC:  referal from Dr. Yetta Barre..pt has been feeling unbalanced.  History of Present Illness: [name] is seen today at the request of Dr Yetta Barre for abnormal ECG ? LVH.  She has had some disequalibrium but not dizzyness or syncope.,  She has significant depression and sees a psychiatrist.  Pristiq recently stopped as it made her more suicidal.  It appears that all of the SSR's contribute to the disequilibrium  She is still profoundlly depressed with outbursts of crying in the room.  BP is well controlled  Reviewed her ECG and it shows borderline voltage for LVH.  She has a family historhy of "massive heart attack"  I tried to reassure her of her heart being fine.  I think in the setting of HTN and LVH on ECG it is reasonable to do an echo to assess septal thickness (likely 12mm or less) and EF No other testing is indicated and she does not need cardiology F/U   \ Current Problems (verified): 1)  Chest Pain  (ICD-786.50) 2)  Hypertension  (ICD-401.9) 3)  Gallstone Pancreatitis  (ICD-577.9) 4)  Panic Attack  (ICD-300.01) 5)  Syncope  (ICD-780.2) 6)  Ataxia  (ICD-781.3) 7)  Family History Diabetes 1st Degree Relative  (ICD-V18.0) 8)  Family History of Alcoholism/addiction  (ICD-V61.41) 9)  Depression  (ICD-311) 10)  Anxiety  (ICD-300.00)  Current Medications (verified): 1)  Lorazepam 3  Mg Tabs (Lorazepam) .... Take 1 Tablet By Mouth Once A Day 2)  Viibryd 10 Mg Tabs (Vilazodone Hcl) .... 10mg  By Mouth Weekly 3)  Diovan 320 Mg Tabs (Valsartan) .... One By Mouth Once Daily For High Blood Pressure 4)  Ibuprofen .... As Needed  Allergies (verified): 1)  ! Pcn 2)  ! Erythromycin 3)  ! Amitriptyline Hcl  Past History:  Past Medical History: Last updated: 06/01/2010 CHEST PAIN HYPERTENSION  GALLSTONE PANCREATITIS  PANIC ATTACK  SYNCOPE ATAXIA  FAMILY HISTORY DIABETES 1ST DEGREE RELATIVE  FAMILY HISTORY OF ALCOHOLISM/ADDICTION    ANXIETY  Depression Dr Evelene Croon  Past Surgical History: Last updated: 06/01/2010 Cholecystectomy  Right wrist intraarticular distal radius fracture.   Family History: Last updated: 05/15/2008 Family History of Alcoholism/Addiction Family History Diabetes 1st degree relative Family History Hypertension Family History of Stroke M 1st degree relative <50 Family History of Suicide attempt  Social History: Last updated: 05/15/2008 Retired Divorced Never Smoked Alcohol use-no Drug use-no Regular exercise-no  Review of Systems       Denies fever, malais, weight loss, blurry vision, decreased visual acuity, cough, sputum, SOB, hemoptysis, pleuritic pain, palpitaitons, heartburn, abdominal pain, melena, lower extremity edema, claudication, or rash.   Vital Signs:  Patient profile:   65 year old female Menstrual status:  postmenopausal Height:      67 inches Weight:      252 pounds BMI:     39.61 Pulse rate:   80 / minute Resp:     16 per minute BP sitting:   132 / 80  (left arm)  Vitals Entered By: Kem Parkinson (June 02, 2010 2:55 PM)  Physical Exam  General:  Affect appropriate Healthy:  appears stated age HEENT: normal Neck supple with no adenopathy JVP normal no bruits no thyromegaly Lungs clear with no wheezing and good diaphragmatic motion Heart:  S1/S2 no murmur,rub, gallop or click PMI normal Abdomen: benighn, BS positve, no tenderness, no AAA no bruit.  No HSM or HJR Distal pulses intact with no  bruits No edema Neuro non-focal Skin warm and dry    Impression & Recommendations:  Problem # 1:  HYPERTENSION (ICD-401.9) Well controlled continue ACE  Avoid Effexor or other NE stimulating drugs Her updated medication list for this problem includes:    Diovan 320 Mg Tabs (Valsartan) ..... One by mouth once daily for high blood pressure  Orders: Echocardiogram (Echo)  Problem # 2:  ELECTROCARDIOGRAM, ABNORMAL (ICD-794.31) Borderline LVH  Echo to  assess LV mass, septal thickness and EF   Orders: Echocardiogram (Echo)  Patient Instructions: 1)  Your physician recommends that you schedule a follow-up appointment in: AS NEEDED PENDING TEST RESULTS 2)  Your physician has requested that you have an echocardiogram.  Echocardiography is a painless test that uses sound waves to create images of your heart. It provides your doctor with information about the size and shape of your heart and how well your heart's chambers and valves are working.  This procedure takes approximately one hour. There are no restrictions for this procedure.

## 2010-09-03 NOTE — Progress Notes (Signed)
Summary: refill request  Phone Note Call from Patient   Caller: Patient Summary of Call: Patient states that she failed to ask for fiorinal Codeine #3 50-325-40-30 rx. Please advise  [facility] Initial call taken by: Rock Nephew CMA,  May 22, 2010 11:28 AM  Follow-up for Phone Call        i don't think she should take this Follow-up by: Etta Grandchild MD,  May 22, 2010 11:46 AM  Additional Follow-up for Phone Call Additional follow up Details #1::        Will notify pt, see prior phone note. Thanks Additional Follow-up by: Rock Nephew CMA,  June 01, 2010 9:21 AM

## 2010-09-03 NOTE — Progress Notes (Signed)
  Phone Note Outgoing Call   Summary of Call: LA, her B12 level is low please ask her to come in for a B12 shot Initial call taken by: Etta Grandchild MD,  May 22, 2010 2:25 PM  Follow-up for Phone Call        LMOVM for pt to call back//see other phone note Follow-up by: Etta Grandchild MD,  May 22, 2010 2:25 PM

## 2010-09-10 ENCOUNTER — Ambulatory Visit
Admission: RE | Admit: 2010-09-10 | Discharge: 2010-09-10 | Disposition: A | Discharge status: Home / Self Care | Payer: BC Managed Care – PPO | Source: Ambulatory Visit | Attending: Neurosurgery | Admitting: Neurosurgery

## 2010-09-10 DIAGNOSIS — G919 Hydrocephalus, unspecified: Secondary | ICD-10-CM

## 2010-09-15 ENCOUNTER — Other Ambulatory Visit: Payer: Self-pay

## 2010-10-12 LAB — CBC
HCT: 45.2 % (ref 36.0–46.0)
Hemoglobin: 15.6 g/dL — ABNORMAL HIGH (ref 12.0–15.0)
MCH: 31.2 pg (ref 26.0–34.0)
MCHC: 34.5 g/dL (ref 30.0–36.0)
MCV: 90.4 fL (ref 78.0–100.0)
Platelets: 292 10*3/uL (ref 150–400)
RBC: 5 MIL/uL (ref 3.87–5.11)
RDW: 12.9 % (ref 11.5–15.5)
WBC: 6.4 10*3/uL (ref 4.0–10.5)

## 2010-10-12 LAB — DIFFERENTIAL
Basophils Absolute: 0 10*3/uL (ref 0.0–0.1)
Basophils Relative: 0 % (ref 0–1)
Eosinophils Absolute: 0.1 10*3/uL (ref 0.0–0.7)
Eosinophils Relative: 2 % (ref 0–5)
Lymphocytes Relative: 21 % (ref 12–46)
Lymphs Abs: 1.4 10*3/uL (ref 0.7–4.0)
Monocytes Absolute: 0.5 10*3/uL (ref 0.1–1.0)
Monocytes Relative: 8 % (ref 3–12)
Neutro Abs: 4.5 10*3/uL (ref 1.7–7.7)
Neutrophils Relative %: 70 % (ref 43–77)

## 2010-10-12 LAB — BASIC METABOLIC PANEL
BUN: 15 mg/dL (ref 6–23)
CO2: 24 mEq/L (ref 19–32)
Calcium: 9.1 mg/dL (ref 8.4–10.5)
Chloride: 103 mEq/L (ref 96–112)
Creatinine, Ser: 0.63 mg/dL (ref 0.4–1.2)
GFR calc Af Amer: >60 mL/min (ref >60)
GFR calc non Af Amer: >60 mL/min (ref >60)
Glucose, Bld: 126 mg/dL — ABNORMAL HIGH (ref 70–99)
Potassium: 3.8 mEq/L (ref 3.5–5.1)
Sodium: 136 mEq/L (ref 135–145)

## 2010-10-12 LAB — SURGICAL PCR SCREEN
MRSA, PCR: NEGATIVE
Staphylococcus aureus: NEGATIVE

## 2010-10-12 LAB — ABO/RH: ABO/RH(D): A POS

## 2010-10-12 LAB — TYPE AND SCREEN
ABO/RH(D): A POS
Antibody Screen: NEGATIVE

## 2010-10-13 LAB — CSF CELL COUNT WITH DIFFERENTIAL
Eosinophils, CSF: 0 % (ref 0–1)
Other Cells, CSF: 0
RBC Count, CSF: 3 /mm3 — ABNORMAL HIGH
Tube #: 1
WBC, CSF: 2 /mm3 (ref 0–5)

## 2010-10-13 LAB — PROTEIN AND GLUCOSE, CSF
Glucose, CSF: 74 mg/dL (ref 43–76)
Total  Protein, CSF: 32 mg/dL (ref 15–45)

## 2010-10-20 LAB — COMPREHENSIVE METABOLIC PANEL
ALT: 24 U/L (ref 0–35)
AST: 24 U/L (ref 0–37)
Albumin: 3.7 g/dL (ref 3.5–5.2)
Alkaline Phosphatase: 67 U/L (ref 39–117)
BUN: 10 mg/dL (ref 6–23)
CO2: 28 mEq/L (ref 19–32)
Calcium: 8.8 mg/dL (ref 8.4–10.5)
Chloride: 105 mEq/L (ref 96–112)
Creatinine, Ser: 0.63 mg/dL (ref 0.4–1.2)
GFR calc Af Amer: >60 mL/min (ref >60)
GFR calc non Af Amer: >60 mL/min (ref >60)
Glucose, Bld: 124 mg/dL — ABNORMAL HIGH (ref 70–99)
Potassium: 3.1 mEq/L — ABNORMAL LOW (ref 3.5–5.1)
Sodium: 140 mEq/L (ref 135–145)
Total Bilirubin: 0.9 mg/dL (ref 0.3–1.2)
Total Protein: 7 g/dL (ref 6.0–8.3)

## 2010-10-20 LAB — CBC
HCT: 41.7 % (ref 36.0–46.0)
Hemoglobin: 14.7 g/dL (ref 12.0–15.0)
MCHC: 35.3 g/dL (ref 30.0–36.0)
MCV: 89.8 fL (ref 78.0–100.0)
Platelets: 264 10*3/uL (ref 150–400)
RBC: 4.64 MIL/uL (ref 3.87–5.11)
RDW: 13.5 % (ref 11.5–15.5)
WBC: 4.4 10*3/uL (ref 4.0–10.5)

## 2010-11-06 LAB — CBC
HCT: 41.3 % (ref 36.0–46.0)
HCT: 44 % (ref 36.0–46.0)
HCT: 46.5 % — ABNORMAL HIGH (ref 36.0–46.0)
Hemoglobin: 14.2 g/dL (ref 12.0–15.0)
Hemoglobin: 15.4 g/dL — ABNORMAL HIGH (ref 12.0–15.0)
Hemoglobin: 15.8 g/dL — ABNORMAL HIGH (ref 12.0–15.0)
MCHC: 34 g/dL (ref 30.0–36.0)
MCHC: 34.5 g/dL (ref 30.0–36.0)
MCHC: 35 g/dL (ref 30.0–36.0)
MCV: 91.3 fL (ref 78.0–100.0)
MCV: 91.5 fL (ref 78.0–100.0)
MCV: 92.7 fL (ref 78.0–100.0)
Platelets: 241 10*3/uL (ref 150–400)
Platelets: 276 10*3/uL (ref 150–400)
Platelets: 286 10*3/uL (ref 150–400)
RBC: 4.51 MIL/uL (ref 3.87–5.11)
RBC: 4.82 MIL/uL (ref 3.87–5.11)
RBC: 5.01 MIL/uL (ref 3.87–5.11)
RDW: 12.5 % (ref 11.5–15.5)
RDW: 12.6 % (ref 11.5–15.5)
RDW: 12.8 % (ref 11.5–15.5)
WBC: 6.2 10*3/uL (ref 4.0–10.5)
WBC: 6.4 10*3/uL (ref 4.0–10.5)
WBC: 8.5 10*3/uL (ref 4.0–10.5)

## 2010-11-06 LAB — COMPREHENSIVE METABOLIC PANEL
ALT: 29 U/L (ref 0–35)
AST: 30 U/L (ref 0–37)
Albumin: 4 g/dL (ref 3.5–5.2)
Alkaline Phosphatase: 78 U/L (ref 39–117)
BUN: 8 mg/dL (ref 6–23)
CO2: 29 mEq/L (ref 19–32)
Calcium: 9.5 mg/dL (ref 8.4–10.5)
Chloride: 99 mEq/L (ref 96–112)
Creatinine, Ser: 0.79 mg/dL (ref 0.4–1.2)
GFR calc Af Amer: >60 mL/min (ref >60)
GFR calc non Af Amer: >60 mL/min (ref >60)
Glucose, Bld: 120 mg/dL — ABNORMAL HIGH (ref 70–99)
Potassium: 3.4 mEq/L — ABNORMAL LOW (ref 3.5–5.1)
Sodium: 136 mEq/L (ref 135–145)
Total Bilirubin: 0.7 mg/dL (ref 0.3–1.2)
Total Protein: 7.3 g/dL (ref 6.0–8.3)

## 2010-11-06 LAB — LITHIUM LEVEL
Lithium Lvl: <0.25 mEq/L — ABNORMAL LOW (ref 0.80–1.40)
Lithium Lvl: 0.65 mEq/L — ABNORMAL LOW (ref 0.80–1.40)

## 2010-11-06 LAB — URINALYSIS, ROUTINE W REFLEX MICROSCOPIC
Bilirubin Urine: NEGATIVE
Bilirubin Urine: NEGATIVE
Bilirubin Urine: NEGATIVE
Glucose, UA: NEGATIVE mg/dL
Glucose, UA: NEGATIVE mg/dL
Glucose, UA: NEGATIVE mg/dL
Hgb urine dipstick: NEGATIVE
Hgb urine dipstick: NEGATIVE
Hgb urine dipstick: NEGATIVE
Ketones, ur: 40 mg/dL — AB
Ketones, ur: NEGATIVE mg/dL
Ketones, ur: NEGATIVE mg/dL
Nitrite: NEGATIVE
Nitrite: NEGATIVE
Nitrite: NEGATIVE
Protein, ur: NEGATIVE mg/dL
Protein, ur: NEGATIVE mg/dL
Protein, ur: NEGATIVE mg/dL
Specific Gravity, Urine: 1.011 (ref 1.005–1.030)
Specific Gravity, Urine: 1.018 (ref 1.005–1.030)
Specific Gravity, Urine: 1.023 (ref 1.005–1.030)
Urobilinogen, UA: 0.2 mg/dL (ref 0.0–1.0)
Urobilinogen, UA: 1 mg/dL (ref 0.0–1.0)
Urobilinogen, UA: 1 mg/dL (ref 0.0–1.0)
pH: 6.5 (ref 5.0–8.0)
pH: 6.5 (ref 5.0–8.0)
pH: 7.5 (ref 5.0–8.0)

## 2010-11-06 LAB — BASIC METABOLIC PANEL
BUN: 7 mg/dL (ref 6–23)
BUN: 7 mg/dL (ref 6–23)
BUN: 8 mg/dL (ref 6–23)
CO2: 25 mEq/L (ref 19–32)
CO2: 25 mEq/L (ref 19–32)
CO2: 27 mEq/L (ref 19–32)
Calcium: 8.6 mg/dL (ref 8.4–10.5)
Calcium: 8.7 mg/dL (ref 8.4–10.5)
Calcium: 9.7 mg/dL (ref 8.4–10.5)
Chloride: 100 mEq/L (ref 96–112)
Chloride: 102 mEq/L (ref 96–112)
Chloride: 103 mEq/L (ref 96–112)
Creatinine, Ser: 0.68 mg/dL (ref 0.4–1.2)
Creatinine, Ser: 0.69 mg/dL (ref 0.4–1.2)
Creatinine, Ser: 0.76 mg/dL (ref 0.4–1.2)
GFR calc Af Amer: >60 mL/min (ref >60)
GFR calc Af Amer: >60 mL/min (ref >60)
GFR calc Af Amer: >60 mL/min (ref >60)
GFR calc non Af Amer: >60 mL/min (ref >60)
GFR calc non Af Amer: >60 mL/min (ref >60)
GFR calc non Af Amer: >60 mL/min (ref >60)
Glucose, Bld: 132 mg/dL — ABNORMAL HIGH (ref 70–99)
Glucose, Bld: 141 mg/dL — ABNORMAL HIGH (ref 70–99)
Glucose, Bld: 176 mg/dL — ABNORMAL HIGH (ref 70–99)
Potassium: 3 mEq/L — ABNORMAL LOW (ref 3.5–5.1)
Potassium: 3.3 mEq/L — ABNORMAL LOW (ref 3.5–5.1)
Potassium: 3.3 mEq/L — ABNORMAL LOW (ref 3.5–5.1)
Sodium: 134 mEq/L — ABNORMAL LOW (ref 135–145)
Sodium: 138 mEq/L (ref 135–145)
Sodium: 141 mEq/L (ref 135–145)

## 2010-11-06 LAB — URINE MICROSCOPIC-ADD ON

## 2010-11-06 LAB — DIFFERENTIAL
Basophils Absolute: 0 10*3/uL (ref 0.0–0.1)
Basophils Absolute: 0 10*3/uL (ref 0.0–0.1)
Basophils Absolute: 0.1 10*3/uL (ref 0.0–0.1)
Basophils Relative: 0 % (ref 0–1)
Basophils Relative: 0 % (ref 0–1)
Basophils Relative: 1 % (ref 0–1)
Eosinophils Absolute: 0 10*3/uL (ref 0.0–0.7)
Eosinophils Absolute: 0.1 10*3/uL (ref 0.0–0.7)
Eosinophils Absolute: 0.4 10*3/uL (ref 0.0–0.7)
Eosinophils Relative: 0 % (ref 0–5)
Eosinophils Relative: 1 % (ref 0–5)
Eosinophils Relative: 4 % (ref 0–5)
Lymphocytes Relative: 15 % (ref 12–46)
Lymphocytes Relative: 18 % (ref 12–46)
Lymphocytes Relative: 5 % — ABNORMAL LOW (ref 12–46)
Lymphs Abs: 0.3 10*3/uL — ABNORMAL LOW (ref 0.7–4.0)
Lymphs Abs: 0.9 10*3/uL (ref 0.7–4.0)
Lymphs Abs: 1.5 10*3/uL (ref 0.7–4.0)
Monocytes Absolute: 0.4 10*3/uL (ref 0.1–1.0)
Monocytes Absolute: 0.5 10*3/uL (ref 0.1–1.0)
Monocytes Absolute: 0.5 10*3/uL (ref 0.1–1.0)
Monocytes Relative: 6 % (ref 3–12)
Monocytes Relative: 7 % (ref 3–12)
Monocytes Relative: 9 % (ref 3–12)
Neutro Abs: 4.7 10*3/uL (ref 1.7–7.7)
Neutro Abs: 5.6 10*3/uL (ref 1.7–7.7)
Neutro Abs: 6.1 10*3/uL (ref 1.7–7.7)
Neutrophils Relative %: 72 % (ref 43–77)
Neutrophils Relative %: 75 % (ref 43–77)
Neutrophils Relative %: 88 % — ABNORMAL HIGH (ref 43–77)

## 2010-11-06 LAB — URINE CULTURE: Colony Count: 35000

## 2010-11-06 LAB — TSH: TSH: 2.479 u[IU]/mL (ref 0.350–4.500)

## 2010-11-06 LAB — ACETAMINOPHEN LEVEL: Acetaminophen (Tylenol), Serum: <10 ug/mL — ABNORMAL LOW (ref 10–30)

## 2010-11-06 LAB — GLUCOSE, CAPILLARY: Glucose-Capillary: 171 mg/dL — ABNORMAL HIGH (ref 70–99)

## 2010-11-06 LAB — HEMOCCULT GUIAC POC 1CARD (OFFICE): Fecal Occult Bld: NEGATIVE

## 2010-11-06 LAB — SALICYLATE LEVEL: Salicylate Lvl: <4 mg/dL (ref 2.8–20.0)

## 2010-11-09 LAB — DIFFERENTIAL
Basophils Absolute: 0 10*3/uL (ref 0.0–0.1)
Basophils Relative: 0 % (ref 0–1)
Eosinophils Absolute: 0 10*3/uL (ref 0.0–0.7)
Eosinophils Relative: 0 % (ref 0–5)
Lymphocytes Relative: 13 % (ref 12–46)
Lymphs Abs: 1.1 10*3/uL (ref 0.7–4.0)
Monocytes Absolute: 0.4 10*3/uL (ref 0.1–1.0)
Monocytes Relative: 5 % (ref 3–12)
Neutro Abs: 6.8 10*3/uL (ref 1.7–7.7)
Neutrophils Relative %: 81 % — ABNORMAL HIGH (ref 43–77)

## 2010-11-09 LAB — BASIC METABOLIC PANEL
BUN: 7 mg/dL (ref 6–23)
CO2: 30 mEq/L (ref 19–32)
Calcium: 9.1 mg/dL (ref 8.4–10.5)
Chloride: 103 mEq/L (ref 96–112)
Creatinine, Ser: 0.8 mg/dL (ref 0.4–1.2)
GFR calc Af Amer: >60 mL/min (ref >60)
GFR calc non Af Amer: >60 mL/min (ref >60)
Glucose, Bld: 93 mg/dL (ref 70–99)
Potassium: 4.3 mEq/L (ref 3.5–5.1)
Sodium: 142 mEq/L (ref 135–145)

## 2010-11-09 LAB — CBC
HCT: 38.8 % (ref 36.0–46.0)
HCT: 45.1 % (ref 36.0–46.0)
Hemoglobin: 12.9 g/dL (ref 12.0–15.0)
Hemoglobin: 15.9 g/dL — ABNORMAL HIGH (ref 12.0–15.0)
MCHC: 33.3 g/dL (ref 30.0–36.0)
MCHC: 35.3 g/dL (ref 30.0–36.0)
MCV: 89.2 fL (ref 78.0–100.0)
MCV: 90.8 fL (ref 78.0–100.0)
Platelets: 298 10*3/uL (ref 150–400)
Platelets: 300 10*3/uL (ref 150–400)
RBC: 4.35 MIL/uL (ref 3.87–5.11)
RBC: 4.97 MIL/uL (ref 3.87–5.11)
RDW: 12.2 % (ref 11.5–15.5)
RDW: 13.6 % (ref 11.5–15.5)
WBC: 6.8 10*3/uL (ref 4.0–10.5)
WBC: 8.3 10*3/uL (ref 4.0–10.5)

## 2010-11-09 LAB — COMPREHENSIVE METABOLIC PANEL
ALT: 20 U/L (ref 0–35)
AST: 31 U/L (ref 0–37)
Albumin: 3.9 g/dL (ref 3.5–5.2)
Alkaline Phosphatase: 89 U/L (ref 39–117)
BUN: 8 mg/dL (ref 6–23)
CO2: 23 mEq/L (ref 19–32)
Calcium: 9.7 mg/dL (ref 8.4–10.5)
Chloride: 107 mEq/L (ref 96–112)
Creatinine, Ser: 0.85 mg/dL (ref 0.4–1.2)
GFR calc Af Amer: >60 mL/min (ref >60)
GFR calc non Af Amer: >60 mL/min (ref >60)
Glucose, Bld: 153 mg/dL — ABNORMAL HIGH (ref 70–99)
Potassium: 3.5 mEq/L (ref 3.5–5.1)
Sodium: 140 mEq/L (ref 135–145)
Total Bilirubin: 0.5 mg/dL (ref 0.3–1.2)
Total Protein: 7.7 g/dL (ref 6.0–8.3)

## 2010-11-09 LAB — SEDIMENTATION RATE: Sed Rate: 7 mm/hr (ref 0–22)

## 2010-11-09 LAB — HEMOGLOBIN A1C
Hgb A1c MFr Bld: 5.6 % (ref 4.6–6.1)
Mean Plasma Glucose: 114 mg/dL

## 2010-11-09 LAB — TROPONIN I
Troponin I: 0.02 ng/mL (ref 0.00–0.06)
Troponin I: <0.01 ng/mL (ref 0.00–0.06)

## 2010-11-09 LAB — POCT CARDIAC MARKERS
CKMB, poc: <1 ng/mL — ABNORMAL LOW (ref 1.0–8.0)
Myoglobin, poc: 64.9 ng/mL (ref 12–200)
Troponin i, poc: <0.05 ng/mL (ref 0.00–0.09)

## 2010-11-09 LAB — TSH: TSH: 1.312 u[IU]/mL (ref 0.350–4.500)

## 2010-11-09 LAB — D-DIMER, QUANTITATIVE: D-Dimer, Quant: 0.33 ug/mL-FEU (ref 0.00–0.48)

## 2010-11-09 LAB — GLUCOSE, CAPILLARY: Glucose-Capillary: 122 mg/dL — ABNORMAL HIGH (ref 70–99)

## 2010-11-09 LAB — CK TOTAL AND CKMB (NOT AT ARMC)
CK, MB: 1 ng/mL (ref 0.3–4.0)
CK, MB: 1 ng/mL (ref 0.3–4.0)
Relative Index: INVALID (ref 0.0–2.5)
Relative Index: INVALID (ref 0.0–2.5)
Total CK: 35 U/L (ref 7–177)
Total CK: 52 U/L (ref 7–177)

## 2010-11-09 LAB — LIPASE, BLOOD: Lipase: 27 U/L (ref 11–59)

## 2010-11-09 LAB — HEMOCCULT GUIAC POC 1CARD (OFFICE): Fecal Occult Bld: NEGATIVE

## 2010-11-09 LAB — H. PYLORI ANTIBODY, IGG: H Pylori IgG: <0.4 {ISR}

## 2010-11-09 LAB — VITAMIN B12: Vitamin B-12: 193 pg/mL — ABNORMAL LOW (ref 211–911)

## 2010-12-15 NOTE — Discharge Summary (Signed)
[name], [name]            ACCOUNT NO.:  [id number]   MEDICAL RECORD NO.:  [id number]          PATIENT TYPE:  INP   LOCATION:  4712                         FACILITY:  [facility]   PHYSICIAN:  Samara Snide, MDDATE OF BIRTH:  [birth year]   DATE OF ADMISSION:  04/11/2008  DATE OF DISCHARGE:  04/13/2008                               DISCHARGE SUMMARY   DISCHARGE DIAGNOSES:  1. Gastroenteritis, resolved.  2. Hypokalemia secondary to gastroenteritis.  3. Hypertension.  4. Migraine headaches.  5. Anxiety.   HISTORY OF PRESENT ILLNESS:  The patient is a 65 year old Caucasian lady  with past medical history significant for anxiety and diet control,  hypertension, as well as migraine headaches.  She was admitted to the  hospital on April 11, 2008, secondary to nausea and vomiting.   Per the patient, she had been vomiting for the past 3 days.  Today, she  reports feeling much better.  She was admitted in the hospital and  started on IV fluid.  The patient is able to tolerate diet now.  She  denies any abdominal pain.  No further nausea or vomiting.   PAST MEDICAL HISTORY:  Significant for anxiety, history of migraine  headaches, diet control, hypertension, status post laparoscopic  cholecystectomy, history of gallstone pancreatitis.   CURRENT MEDICATIONS/DISCHARGE MEDICATIONS:  Aspirin 325 mg daily,  senna 1 tab p.o. at bedtime p.r.n. for constipation,  Fiorinal as needed for migraine headaches,  omeprazole 20 mg p.o. daily.  Pristiq 50 mg daily   PHYSICAL EXAMINATION:  VITAL SIGNS:  Today, temperature 98.3, pulse of  84, respirations 18, blood pressure 140/84, prior blood pressure  readings have been 118/67, 131/80.  GENERAL APPEARANCE:  The patient in no acute distress, alert and  conversant, denies any pain.  EYES:  Conjunctivae looks normal.  NECK:  Supple.  No JVD.  CHEST/BREAST:  Lungs are clear.  CARDIOVASCULAR:  S1 and S2, no murmur.  GASTROINTESTINAL:   Abdomen soft.  Bowel sounds present.  No tenderness  on palpation.  MUSCULOSKELETAL:  Able to move all 4 extremities.  NEUROLOGIC:  Cranial nerves grossly intact.  No focalities noted.   LABORATORY DATA:  Discharge lab on April 12, 2008:  WBC 5.5,  hemoglobin 14.3, and platelets 295.  Sodium 140, potassium was low at  3.4, BUN 7, creatinine 0.7, glucose 102.   The patient had cardiac enzymes as she had some left-sided numbness and  questionable pain on admission.  All the cardiac enzymes x2 were  negative while at hospital.   ASSESSMENT AND PLAN:  Pt hemodynamically stable for d/c  1. Gastroenteritis likely viral, resolved with IV hydration.  The      patient is tolerating oral regular diet at present.  No further      nausea, vomiting.  We will discharge the patient home with      discharge medications.  The patient is advised to follow with      primary care doctor, Dr. Penni Bombard in 1-2 weeks after discharge.  2. Hypertension.  The patient's blood pressure readings in the      hospital have been satisfactory  except for 1 blood pressure reading      of 140/84 today and 1 reading of 157/89.  I would start the patient      on low-dose Bystolic 5 mg p.o. daily.  We would advice to follow up      blood pressure as an outpatient.  3. Migraine.  Continue with Fiorinal as needed.  4. Depression/anxiety.  Continue Pristiq as before.      Samara Snide, MD  Electronically Signed     SAS/MEDQ  D:  04/13/2008  T:  04/14/2008  Job:  [id number]   cc:   Dr. Penni Bombard

## 2010-12-15 NOTE — H&P (Signed)
[name], [name]            ACCOUNT NO.:  [id number]   MEDICAL RECORD NO.:  [id number]          PATIENT TYPE:  EMS   LOCATION:  MAJO                         FACILITY:  [facility]   PHYSICIAN:  Ladell Pier, M.D.   DATE OF BIRTH:  02/11/1946   DATE OF ADMISSION:  04/11/2008  DATE OF DISCHARGE:                              HISTORY & PHYSICAL   CHIEF COMPLAINT:  Nausea and vomiting with some mild epigastric pain.   HISTORY OF PRESENT ILLNESS:  The patient is a 65 year old white female  with past medical history significant for anxiety and diet controlled  hypertension as well as migraine headaches.  The patient presented to  the emergency department with nausea and vomiting for the past 3 days.  She states that she has not been able to keep anything down.  She  complains of vomiting bile.  She had some tenderness in the epigastric  area that she describes as just some discomfort.  She stated that  earlier today, for about 6 hours, she had some numbness in her left arm.  She has had no diarrhea.  She has had no sick contacts.  She did not eat  out.  Back in 2006, she had question of ascending cholangitis, that she  presented also with nausea, vomiting.  She has also had a history of  gallstone pancreatitis in the past.  She is status post cholecystectomy.   PAST MEDICAL HISTORY:  Significant for  1. Anxiety.  She states that she was recently in the emergency room      with a panic attack after her friend died.  2. She has, as mentioned before, diet controlled hypertension.  3. Migraine headaches.  4. She is status post laparoscopic cholecystectomy.  5. She has a history of gallstone pancreatitis.   FAMILY HISTORY:  She states that her mother died from breast cancer.  Father died at 31 from a massive heart attack.   SOCIAL HISTORY:  The patient stated that she recently retired from [employer].  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  She has no children.  She is divorced.   No tobacco or alcohol use or any illicit drug use.   MEDICATIONS:  She takes Pristiq 50 mg daily and Fiorinal with codeine  p.r.n. for migraine headaches.   ALLERGIES:  She is allergic to PENICILLIN, ERYTHROMYCIN, AMPICILLIN and  AMOXICILLIN.   REVIEW OF SYSTEMS:  Negative, otherwise stated in the HPI.   PHYSICAL EXAMINATION:  VITAL SIGNS:  Temperature 97, blood pressure  136/73, pulse of 96, respirations 15, pulse ox 96% on room air.  HEENT: Normocephalic, atraumatic.  Pupils reactive to light.  Throat  without erythema.  CARDIOVASCULAR:  Regular rate and rhythm.  No murmurs, rubs or gallops.  LUNGS:  Clear bilaterally.  No wheeze, rhonchi or rales.  ABDOMEN:  Soft with mild tenderness in the epigastric area.  Positive  bowel sounds.  EXTREMITIES:  Without edema.   LABORATORY DATA:  She had an acute abdominal series that shows normal  bowel gas pattern, no free air.   Cardiac markers are negative.  Lipase of 17.  Sodium 139, potassium 3.5,  chloride 106, CO2 24, glucose 131, BUN 11, creatinine 0.69.  AST of 19,  ALT 16, alkaline phosphatase of 78.  UA negative.   EKG shows no acute ST-segment elevation or depression.   ASSESSMENT/PLAN:  1. Nausea, vomiting.  2. Epigastric pain.  3. Anxiety.  4. Diet controlled hypertension.  5. Migraine headaches.   Will admit the patient to the hospital.  Per emergency department  physician, patient was here yesterday and is back today again.  He is  worried about anginal equivalent with her having the numbness and  tingling in her left arm and her family history of early heart disease.  Will admit the patient to the hospital, ruled out MI with serial cardiac  enzymes.  Will get a right upper quadrant ultrasound also to look at her  liver.  Will also hold her for psych meds as that possibly can cause  some nausea and  vomiting and will treat her with Zofran and Phenergan  as needed.  Will continue Fiorinal as needed for migraine  headaches.  She presently does not have a migraine headache.      Ladell Pier, M.D.  Electronically Signed     NJ/MEDQ  D:  04/11/2008  T:  04/11/2008  Job:  [id number]   cc:   Dr. Penni Bombard or Artis Flock

## 2010-12-15 NOTE — H&P (Signed)
[name], [name]            ACCOUNT NO.:  [id number]   MEDICAL RECORD NO.:  [id number]          PATIENT TYPE:  INP   LOCATION:  3733                         FACILITY:  [facility]   PHYSICIAN:  Georgina Quint. Plotnikov, MDDATE OF BIRTH:  1945/12/13   DATE OF ADMISSION:  01/25/2009  DATE OF DISCHARGE:                              HISTORY & PHYSICAL   CHIEF COMPLAINT:  Chest pain, nausea, vomiting.   HISTORY OF PRESENT ILLNESS:  The patient is a 65 year old female who  presented with complaint of epigastric pressure, chest pain, nausea, dry  heaves with occasional vomiting, dizziness, anxiety, and panic attacks  of several weeks' duration.  Over past 2 days, she has been having  increasing pressure in the chest, fullness in the epigastric area.  She  vomited couple of times today.  In the end of her emesis, she reports  seeing bright red blood and a bit of a coffee ground type of material.  She has been having a lot of migraines over the past month requiring her  to take 6-8 Fiorinals a day.  In fact, her migraines had been bothering  her more than usual over the past 2 months.  Past medical history of  panic attacks and anxiety since the age of 63.   CURRENT MEDICATIONS:  No prescription medicines except for Fiorinals 6-8  per day over the past 2 months for migraines.   ALLERGIES:  PENICILLIN, ERYTHROMYCIN, AMOXICILLIN.   FAMILY HISTORY:  Father died of a heart attack at age of 67.   SOCIAL HISTORY:  She is retired from [employer] in August last  year.  She is single, does not smoke or drink.   REVIEW OF SYSTEMS:  There has been having a lot of anxiety and stress  and passed away this summer who lives next door to her, worsening GI  complaints, chest pain as above.  Denies much of exertional symptoms of  fluctuating weight due to stress eating, no syncope.  No blood in the  stool.  No black tarry looking bowel movements, depressed.  No suicide.  The rest of the 18-point  review of systems is as above or negative.   PHYSICAL EXAMINATION:  VITAL SIGNS:  Temperature 98.3 blood pressure  144/90, heart rate 114, respirations 22, sats 94% on room air.  Vital  signs improved after some rest.  GENERAL:  She is overweight.  She is in no acute distress.  HEENT:  With moist mucosa.  NECK:  Supple.  No meningeal signs.  No thyromegaly.  LUNGS:  Clear.  No wheezes or rales.  HEART:  S1 and S2.  No murmur.  No gallop.  ABDOMEN:  Obese, soft, tender in epigastric area and over the size of  the sternum as well bilaterally.  Bowel sounds positive.  No  organomegaly.  EXTREMITIES:  Lower extremities bilaterally with trace edema.  Calves  nontender.  Pulses normal and symmetric.  Cranial nerves II through XII  normal.  Deep venous muscle strength within normal limits.  No meningeal  signs.  NEUROLOGIC:  She is alert and cooperative, depressed, not suicidal.   LABORATORY  DATA:  White count 8.3, hemoglobin 15.9, platelets 300.  CK-  MB less than 1.0, troponin less than 0.05, myoglobin 64.9.  Hemoccult  stool negative.  Sodium 140, potassium 3.5, glucose 153, creatinine  0.85, BUN 8, ALT, AST normal, lipase 27.  Chest X-Ray: COPD, chronic  changes.  EKG with sinus tachycardia, ventricular rate 99.   ASSESSMENT/PLAN:  1. Chest pain and dyspnea due to problem #6 and #5, most likely.We      will admit to telemetry.  Obtain the series of enzymes.  EKG in the      morning to rule out myocardial infarction.  2. Epigastric pain with a single episode of probable hematemesis      likely due to NSAID induced gastritis versus ulcer.  Hematemesis is      likely to be due to [employer] tear.  She may need a GI      appointment as an outpatient or inpatient if more problems.  3. Worsening migraine headaches with Fiorinal overuse over the past 1-      2 months.  We will use Ultram instead.  We will use Phenergan      p.r.n., start Protonix.  4. Panic attacks and anxiety.  5.  Depression.  We will start Paxil, we will start Xanax, may need      outpatient treatment with Behavioral medicine.  6. Nausea, vomiting.      Georgina Quint. Plotnikov, MD  Electronically Signed     AVP/MEDQ  D:  01/25/2009  T:  01/26/2009  Job:  [id number]

## 2010-12-18 ENCOUNTER — Other Ambulatory Visit: Payer: Self-pay | Admitting: Neurosurgery

## 2010-12-18 DIAGNOSIS — Z982 Presence of cerebrospinal fluid drainage device: Secondary | ICD-10-CM

## 2010-12-18 NOTE — Consult Note (Signed)
[name], [name]            ACCOUNT NO.:  [id number]   MEDICAL RECORD NO.:  [id number]          PATIENT TYPE:  OIB   LOCATION:  5707                         FACILITY:  [facility]   PHYSICIAN:  Jordan Hawks. Elnoria Howard, MD    DATE OF BIRTH:  14-Sep-1945   DATE OF CONSULTATION:  05/14/2004  DATE OF DISCHARGE:                                   CONSULTATION   REFERRING PHYSICIAN:  Dr. Leonie Man.   REASON FOR CONSULTATION:  CBD stone.   HISTORY OF PRESENT ILLNESS:  This is a 65 year old white female with a past  medical history of migraines, peptic ulcer disease in 1969 and total knee  replacement, admitted electively for a laparoscopic cholecystectomy.  The  patient was admitted previously on April 06, 2004 for abdominal pain.  At  that time, she was noted to have elevated transaminases which were  consistent with an obstructive __________.  An ultrasound was performed and  revealed gallstones.  An ERCP was performed at that time for the possibility  of CBD stones and also to clear the CBD in preparation for a  cholecystectomy.  During that time, the ERCP was successful, however, she  was markedly difficult to intubate the CBD.  Post procedure, the patient  developed post-ERCP pancreatitis.  A stent was placed in during the  procedure to decrease this risk of pancreatitis and on subsequent followup  with a KUB, the patient did not have further evidence of the stent in place.  During the interval period after her admission for the biliary colic and  subsequent post-ERCP pancreatitis, the patient was convalescing and doing  quite well.  She denies any further episodes of abdominal pain, nausea or  vomiting, or any other prior symptoms that she was initially admitted with  earlier in the month of September.  The laparoscopic cholecystectomy was  performed without difficulty and an intraoperative cholangiogram was  performed and revealed a distal CBD stone; this was confirmed with repeat  intraoperative cholangiogram.  Additionally, the patient was noted to have a  mildly dilated CBD.  At this time postoperatively, the patient is doing  relatively well, however, she has had 1 episode of nausea and vomiting  secondary to the anesthesia, however, she is in relatively good spirits.   ALLERGIES:  PENICILLIN and ERYTHROMYCIN.   PAST MEDICAL HISTORY AND PAST SURGICAL HISTORY:  Past medical history and  past surgical history are as stated above.   MEDICATIONS:  1.  Fiorinal.  2.  Ibuprofen 500 mg p.r.n.  3.  Zantac p.r.n.  4.  Tylenol p.r.n.   FAMILY HISTORY:  Family history is significant for breast cancer and  gallbladder disease.   SOCIAL HISTORY:  Social history is negative for tobacco use and illicit drug  use.  She does have very occasional alcohol use.  Currently, the patient is  divorced and without any children.   REVIEW OF SYSTEMS:  Review of systems is negative for any chest pain or  shortness of breath, however, she is reporting nausea and vomiting.  She  denies having any arthritic type of complaints and there is no change  in her  bowel habits and no reports of significant weight loss or weight gain.   PHYSICAL EXAM:  VITAL SIGNS:  140/60 with a heart rate of 80, temperature is  96.8.  GENERAL:  Generally, the patient is in no acute distress, alert and  oriented.  HEENT:  Normocephalic, atraumatic.  Extraocular muscles intact.  Pupils are  equal and round, reactive to light.  NECK:  Neck is supple with no lymphadenopathy.  LUNGS:  Lungs are clear to auscultation bilaterally.  CARDIOVASCULAR:  Regular rate and rhythm.  ABDOMEN:  Abdomen is obese, soft.  Some tenderness at the incisional sites.  There is no evidence of any rebound and there are positive bowel sounds.  EXTREMITIES:  No clubbing, cyanosis, or edema.  SKIN:  Skin is warm, dry and intact.  NEUROLOGICAL:  Neurologically, the patient is grossly intact.   LABORATORY VALUES:  Sodium is 140,  potassium 3.8, chloride is 107, CO2 28,  glucose is 111, BUN is 15, creatinine 0.8, calcium is 9.0; white blood cell  count is 5.7, hemoglobin 14.8, MCV is 88.5, platelets of 281,000; these  laboratory values are on May 12, 2004.   IMAGING:  Imaging as stated in the history of present illness in regards to  the intraoperative cholangiogram.   IMPRESSION:  1.  Common bile duct stone.  2.  Status post laparoscopic cholecystectomy.   The patient definitely demonstrates a common bile duct stone in the distal  portion which is currently not able to be passed.  There is no evidence of  ascending cholangitis, as her white count is normal and there is no evidence  of fever, jaundice or chills.  There is also evidence of dilatation of the  common bile duct secondary to the stone and it may have been that the  patient had developed a slight dilatation during the interval month while  she was convalescing from the mild pancreatitis.   PLAN:  1.  To perform an ERCP with sphincterotomy to remove the stone.  The risks      and benefits of the procedure were stated to the patient, the risks      including bleeding, infection, perforation, medication reaction, the      risk of pancreatitis, which is markedly elevated in this patient, as she      had a prior history of pancreatitis with the previous procedure, and the      risk of death.  All of these complications are not exclusive of any      other problems that may occur which are unforeseen at this time.      Additionally, it was discussed with Dr. Lurene Shadow that injecting      the CBD under pressure should not result in any leakage of contrast from      the remnant cystic duct.  Dr. Lurene Shadow felt that the 4 clips were adequate      in regards to preventing any cystic duct leakage.  2.  We will check laboratory values at this time.      PDH/MEDQ  D:  05/14/2004  T:  05/15/2004  Job:  [id number]   cc:   Leonie Man, M.D.  [address]   [address]  [place]  [place] [address]  Fax: [phone]

## 2010-12-18 NOTE — Op Note (Signed)
[name], [name]            ACCOUNT NO.:  [id number]   MEDICAL RECORD NO.:  [id number]          PATIENT TYPE:  OIB   LOCATION:  5707                         FACILITY:  [facility]   PHYSICIAN:  Jordan Hawks. Elnoria Howard, MD    DATE OF BIRTH:  [birth year]   DATE OF PROCEDURE:  05/15/2004  DATE OF DISCHARGE:                                 OPERATIVE REPORT   PROCEDURE:  ERCP.   INDICATIONS FOR PROCEDURE:  Common bile duct stone.   ENDOSCOPIST:  Jordan Hawks. Elnoria Howard, M.D.   INSTRUMENT USED:  Olympus therapeutic duodenoscope.   Informed consent was obtained from the patient describing the risks of  bleeding, infection, perforation, medication reaction, a 7% risk of  pancreatitis, although in this patient with a prior history of post-ERCP  pancreatitis, she is at a five-fold increase in regards to this  complication, and the risk of death, all of which are not exclusive of any  other complications that  can occur.   PHYSICAL EXAMINATION:  HEART:  Regular rate and rhythm.  LUNGS:  Clear to auscultation bilaterally.  ABDOMEN:  Obese with tenderness at the incision site.   MEDICATIONS:  Demerol 100 mg IV, Versed 10 mg IV, Hypaque 30% 9 mL used.   DESCRIPTION OF PROCEDURE:  After adequate sedation was achieved, the  duodenoscope was advanced from the oral mucosa to the second portion of the  duodenum.  At that time, photo documentation was obtained of the ampulla.  Subsequently, a trichrome sphincterotome was used to cannulate the CBD which  was successful.  A 0.35 mm wire was advanced into the left intrahepatic bile  ducts.  After securing the position, Hypaque was injected into the biliary  tract.  There was no evidence of intrahepatic biliary dilatation.  The  surgical clips were seen on fluoroscopy at the cystic duct.  There is no  evidence of any bile leak.  As the contrast filled the CBD entirely, there  was no evidence of stones or strictures.  The CBD was noted to be mildly  dilated  measuring 7 to 10 mm.  A sphincterotomy was then performed which  would measure approximately 7 mm.  There is no evidence of any bleeding.  The cut mode was used during this time.  Subsequently, there was a copious  amount of bile flow, however, prior to intubation of the CBD, on inspection  of the papilla, the patient was noted to have good bile flow.  After the  sphincterotomy was made, the size of the incision was determined by  advancing the sphincterotomy and full bow, the sphincterotome was retracted  from the CBD and through the papilla without difficulty.  The sphincterotome  was able to be moved in this position in and out freely.  Subsequently, the  sphincterotome was then exchanged and an 8 mm balloon was inserted into the  CBD starting at the bifurcation.  The 8 mm balloon was then swept down to  the papilla with no difficulty.  Again, there was no evidence of any stones  or pus or debris noted to come out from the CBD into the  lumen of the  duodenum.  This was performed twice without any difficulty.  Of  note, there is no intubation of the pancreas with the sphincterotome.  There  was no injection of contrast into the pancreas.  The patient tolerated the  procedure well and no complications were encountered.   PLAN:  Follow up on the liver enzymes.  Routine postoperative laparoscopic  cholecystectomy care.       PDH/MEDQ  D:  05/15/2004  T:  05/15/2004  Job:  [id number]   cc:   Leonie Man, M.D.  [address]  [address]  [place]  [place] [address]  Fax: [phone]

## 2010-12-18 NOTE — Discharge Summary (Signed)
[name], [name]            ACCOUNT NO.:  [id number]   MEDICAL RECORD NO.:  [id number]          PATIENT TYPE:  OBV   LOCATION:  3733                         FACILITY:  [facility]   PHYSICIAN:  Valerie A. Felicity Coyer, MDDATE OF BIRTH:  [birth year]   DATE OF ADMISSION:  01/25/2009  DATE OF DISCHARGE:  01/26/2009                               DISCHARGE SUMMARY   DISCHARGE DIAGNOSES:  1. Nausea and vomiting, resolved, likely due to gastroenteritis.  2. Chest pain secondary to nausea and vomiting.  Cardiac enzymes      negative x2 and telemetry also negative.  3. Migraine headaches.  4. History of panic attacks and anxiety.  5. History of depression.   Discharge medications are as prior to admission without change and  include,  1. Aspirin 325 mg daily.  2. Senokot one tablet daily.  3. Fiorinal p.r.n. headache.  4. Omeprazole 20 mg daily.  5. Pristiq 50 mg daily.   DISPOSITION:  The patient was to request follow up with primary care  physician, Dr. Sanda Linger in the next 2 weeks following discharge.   CONDITION ON DISCHARGE:  Medically improved and stable.   HOSPITAL COURSE BY PROBLEM:  Nausea, vomiting with chest pain.  The  patient is a 65 year old woman who presented to the emergency room  complaining of chest pain in the setting of nausea and vomiting.  Please  see full dictation H and P by Dr. Posey Rea for these details.  She was  admitted to telemetry for further evaluation to rule out cardiac  etiology as well as treat underlying symptoms.  Her cardiac workup was  negative for acute cardiac event and her GI symptoms rapidly resolved.  It was felt that these symptoms were likely due to gastroenteritis and  abdominal ultrasound was performed due to mild elevation of LFTs, which  was also unremarkable.  Because of the resolution of symptoms without  other acute etiology, requiring further inpatient hospitalization.  The  patient was allowed to discharge home.  She was  tolerating liquid diet  and had no further complaints.      Valerie A. Felicity Coyer, MD  Electronically Signed     VAL/MEDQ  D:  03/26/2009  T:  03/27/2009  Job:  [id number]

## 2010-12-29 ENCOUNTER — Ambulatory Visit
Admission: RE | Admit: 2010-12-29 | Discharge: 2010-12-29 | Disposition: A | Discharge status: Home / Self Care | Payer: BC Managed Care – PPO | Source: Ambulatory Visit | Attending: Neurosurgery | Admitting: Neurosurgery

## 2010-12-29 DIAGNOSIS — Z982 Presence of cerebrospinal fluid drainage device: Secondary | ICD-10-CM

## 2011-04-29 LAB — DIFFERENTIAL
Basophils Absolute: 0
Basophils Relative: 0
Eosinophils Absolute: 0
Eosinophils Relative: 0
Lymphocytes Relative: 20
Lymphs Abs: 1.5
Monocytes Absolute: 0.4
Monocytes Relative: 6
Neutro Abs: 5.4
Neutrophils Relative %: 74

## 2011-04-29 LAB — CBC
HCT: 45.6
Hemoglobin: 16.3 — ABNORMAL HIGH
MCHC: 35.8
MCV: 90.5
Platelets: 321
RBC: 5.04
RDW: 12.4
WBC: 7.3

## 2011-04-29 LAB — POCT I-STAT, CHEM 8
BUN: 16
Calcium, Ion: 1.05 — ABNORMAL LOW
Chloride: 109
Creatinine, Ser: 0.8
Glucose, Bld: 132 — ABNORMAL HIGH
HCT: 47 — ABNORMAL HIGH
Hemoglobin: 16 — ABNORMAL HIGH
Potassium: 3.4 — ABNORMAL LOW
Sodium: 138
TCO2: 19

## 2011-04-29 LAB — POCT CARDIAC MARKERS
CKMB, poc: <1 — ABNORMAL LOW
Myoglobin, poc: 69
Operator id: 257131
Troponin i, poc: <0.05

## 2011-05-04 ENCOUNTER — Telehealth: Payer: Self-pay

## 2011-05-04 MED ORDER — VALSARTAN 320 MG PO TABS: 320.0000 mg | ORAL_TABLET | Freq: Every day | ORAL | Status: DC

## 2011-05-04 NOTE — Telephone Encounter (Signed)
Patient stopped by office, filled out a walk in sheet request a 30day rx instead of 90 day rx. Rx approved x 2 and advised to make office visit.

## 2011-05-05 LAB — DIFFERENTIAL
Basophils Absolute: 0.1
Basophils Relative: 1
Eosinophils Absolute: 0.2
Eosinophils Relative: 3
Lymphocytes Relative: 15
Lymphs Abs: 1.2
Monocytes Absolute: 0.4
Monocytes Relative: 6
Neutro Abs: 6.1
Neutrophils Relative %: 76

## 2011-05-05 LAB — POCT I-STAT, CHEM 8
BUN: 14
BUN: 18
Calcium, Ion: 1.16
Calcium, Ion: 1.18
Chloride: 105
Chloride: 106
Creatinine, Ser: 0.8
Creatinine, Ser: 0.9
Glucose, Bld: 126 — ABNORMAL HIGH
Glucose, Bld: 128 — ABNORMAL HIGH
HCT: 43
HCT: 45
Hemoglobin: 14.6
Hemoglobin: 15.3 — ABNORMAL HIGH
Potassium: 3.4 — ABNORMAL LOW
Potassium: 3.6
Sodium: 139
Sodium: 140
TCO2: 25
TCO2: 30

## 2011-05-05 LAB — COMPREHENSIVE METABOLIC PANEL
ALT: 16
ALT: 16
AST: 17
AST: 19
Albumin: 3.5
Albumin: 3.5
Alkaline Phosphatase: 70
Alkaline Phosphatase: 74
BUN: 11
BUN: 7
CO2: 24
CO2: 26
Calcium: 8.9
Calcium: 9
Chloride: 106
Chloride: 107
Creatinine, Ser: 0.69
Creatinine, Ser: 0.7
GFR calc Af Amer: >60
GFR calc Af Amer: >60
GFR calc non Af Amer: >60
GFR calc non Af Amer: >60
Glucose, Bld: 102 — ABNORMAL HIGH
Glucose, Bld: 131 — ABNORMAL HIGH
Potassium: 3.4 — ABNORMAL LOW
Potassium: 3.5
Sodium: 139
Sodium: 140
Total Bilirubin: 0.9
Total Bilirubin: 1
Total Protein: 6.6
Total Protein: 6.6

## 2011-05-05 LAB — URINE MICROSCOPIC-ADD ON

## 2011-05-05 LAB — CARDIAC PANEL(CRET KIN+CKTOT+MB+TROPI)
CK, MB: 0.8
CK, MB: 0.8
CK, MB: 0.9
Relative Index: INVALID
Relative Index: INVALID
Relative Index: INVALID
Total CK: 45
Total CK: 48
Total CK: 49
Troponin I: 0.01
Troponin I: 0.01
Troponin I: 0.02

## 2011-05-05 LAB — POCT CARDIAC MARKERS
CKMB, poc: <1 — ABNORMAL LOW
CKMB, poc: <1 — ABNORMAL LOW
Myoglobin, poc: 47
Myoglobin, poc: 68.8
Troponin i, poc: <0.05
Troponin i, poc: <0.05

## 2011-05-05 LAB — URINALYSIS, ROUTINE W REFLEX MICROSCOPIC
Bilirubin Urine: NEGATIVE
Glucose, UA: NEGATIVE
Hgb urine dipstick: NEGATIVE
Ketones, ur: 15 — AB
Nitrite: NEGATIVE
Protein, ur: NEGATIVE
Specific Gravity, Urine: 1.025
Urobilinogen, UA: 1
pH: 6.5

## 2011-05-05 LAB — CBC
HCT: 41.4
HCT: 44.9
Hemoglobin: 14.3
Hemoglobin: 15.3 — ABNORMAL HIGH
MCHC: 34.2
MCHC: 34.6
MCV: 91.9
MCV: 92.1
Platelets: 295
Platelets: 340
RBC: 4.5
RBC: 4.88
RDW: 12.1
RDW: 12.7
WBC: 5.5
WBC: 8

## 2011-05-05 LAB — LIPASE, BLOOD: Lipase: 17

## 2011-05-05 LAB — TSH: TSH: 1.373

## 2011-05-24 ENCOUNTER — Other Ambulatory Visit: Payer: Self-pay | Admitting: *Deleted

## 2011-05-24 MED ORDER — VALSARTAN 320 MG PO TABS: 320.0000 mg | ORAL_TABLET | Freq: Every day | ORAL | Status: DC

## 2011-07-14 ENCOUNTER — Other Ambulatory Visit: Payer: Self-pay | Admitting: Neurosurgery

## 2011-07-14 DIAGNOSIS — Z982 Presence of cerebrospinal fluid drainage device: Secondary | ICD-10-CM

## 2011-07-16 ENCOUNTER — Other Ambulatory Visit: Payer: Self-pay | Admitting: Internal Medicine

## 2011-07-18 ENCOUNTER — Other Ambulatory Visit: Payer: Self-pay | Admitting: Internal Medicine

## 2011-08-04 ENCOUNTER — Ambulatory Visit
Admission: RE | Admit: 2011-08-04 | Discharge: 2011-08-04 | Disposition: A | Discharge status: Home / Self Care | Payer: BC Managed Care – PPO | Source: Ambulatory Visit | Attending: Neurosurgery | Admitting: Neurosurgery

## 2011-08-04 DIAGNOSIS — Z982 Presence of cerebrospinal fluid drainage device: Secondary | ICD-10-CM

## 2012-03-03 ENCOUNTER — Other Ambulatory Visit: Payer: Self-pay | Admitting: Neurosurgery

## 2012-03-03 DIAGNOSIS — G919 Hydrocephalus, unspecified: Secondary | ICD-10-CM

## 2012-03-15 ENCOUNTER — Ambulatory Visit
Admission: RE | Admit: 2012-03-15 | Discharge: 2012-03-15 | Disposition: A | Discharge status: Home / Self Care | Payer: BC Managed Care – PPO | Source: Ambulatory Visit | Attending: Neurosurgery | Admitting: Neurosurgery

## 2012-03-15 DIAGNOSIS — G919 Hydrocephalus, unspecified: Secondary | ICD-10-CM

## 2012-03-15 MED ORDER — IOHEXOL 300 MG/ML  SOLN
75.0000 mL | Freq: Once | INTRAMUSCULAR | Status: AC | PRN
  Administered 2012-03-15: 75 mL via INTRAVENOUS

## 2012-10-18 LAB — HM DIABETES EYE EXAM: HM Diabetic Eye Exam: NORMAL

## 2013-03-08 ENCOUNTER — Encounter: Payer: BC Managed Care – PPO | Admitting: Internal Medicine

## 2013-07-19 ENCOUNTER — Other Ambulatory Visit: Payer: Self-pay | Admitting: Neurosurgery

## 2013-07-19 DIAGNOSIS — G911 Obstructive hydrocephalus: Secondary | ICD-10-CM

## 2013-08-08 ENCOUNTER — Ambulatory Visit
Admission: RE | Admit: 2013-08-08 | Discharge: 2013-08-08 | Disposition: A | Discharge status: Home / Self Care | Payer: Medicare Other | Source: Ambulatory Visit | Attending: Neurosurgery | Admitting: Neurosurgery

## 2013-08-08 DIAGNOSIS — G911 Obstructive hydrocephalus: Secondary | ICD-10-CM

## 2013-08-15 ENCOUNTER — Ambulatory Visit (INDEPENDENT_AMBULATORY_CARE_PROVIDER_SITE_OTHER): Payer: 59

## 2013-08-15 ENCOUNTER — Encounter: Payer: Self-pay | Admitting: Internal Medicine

## 2013-08-15 ENCOUNTER — Ambulatory Visit (INDEPENDENT_AMBULATORY_CARE_PROVIDER_SITE_OTHER): Payer: 59 | Admitting: Internal Medicine

## 2013-08-15 VITALS — BP 150/100 | HR 80 | Temp 97.5°F | Resp 16 | Ht 65.0 in | Wt 257.0 lb

## 2013-08-15 DIAGNOSIS — E118 Type 2 diabetes mellitus with unspecified complications: Secondary | ICD-10-CM | POA: Insufficient documentation

## 2013-08-15 DIAGNOSIS — IMO0001 Reserved for inherently not codable concepts without codable children: Secondary | ICD-10-CM

## 2013-08-15 DIAGNOSIS — I10 Essential (primary) hypertension: Secondary | ICD-10-CM

## 2013-08-15 DIAGNOSIS — E1165 Type 2 diabetes mellitus with hyperglycemia: Principal | ICD-10-CM

## 2013-08-15 DIAGNOSIS — R32 Unspecified urinary incontinence: Secondary | ICD-10-CM

## 2013-08-15 DIAGNOSIS — R200 Anesthesia of skin: Secondary | ICD-10-CM

## 2013-08-15 DIAGNOSIS — R202 Paresthesia of skin: Secondary | ICD-10-CM

## 2013-08-15 DIAGNOSIS — N3281 Overactive bladder: Secondary | ICD-10-CM | POA: Insufficient documentation

## 2013-08-15 DIAGNOSIS — R209 Unspecified disturbances of skin sensation: Secondary | ICD-10-CM

## 2013-08-15 DIAGNOSIS — E538 Deficiency of other specified B group vitamins: Secondary | ICD-10-CM

## 2013-08-15 DIAGNOSIS — G910 Communicating hydrocephalus: Secondary | ICD-10-CM

## 2013-08-15 LAB — CBC WITH DIFFERENTIAL/PLATELET
Basophils Absolute: 0 10*3/uL (ref 0.0–0.1)
Basophils Relative: 0.7 % (ref 0.0–3.0)
Eosinophils Absolute: 0.2 10*3/uL (ref 0.0–0.7)
Eosinophils Relative: 3.2 % (ref 0.0–5.0)
HCT: 43.1 % (ref 36.0–46.0)
Hemoglobin: 15 g/dL (ref 12.0–15.0)
Lymphocytes Relative: 20.5 % (ref 12.0–46.0)
Lymphs Abs: 1.3 10*3/uL (ref 0.7–4.0)
MCHC: 34.7 g/dL (ref 30.0–36.0)
MCV: 88.7 fl (ref 78.0–100.0)
Monocytes Absolute: 0.4 10*3/uL (ref 0.1–1.0)
Monocytes Relative: 6 % (ref 3.0–12.0)
Neutro Abs: 4.3 10*3/uL (ref 1.4–7.7)
Neutrophils Relative %: 69.6 % (ref 43.0–77.0)
Platelets: 273 10*3/uL (ref 150.0–400.0)
RBC: 4.86 Mil/uL (ref 3.87–5.11)
RDW: 12.8 % (ref 11.5–14.6)
WBC: 6.2 10*3/uL (ref 4.5–10.5)

## 2013-08-15 LAB — COMPREHENSIVE METABOLIC PANEL
ALT: 21 U/L (ref 0–35)
AST: 18 U/L (ref 0–37)
Albumin: 3.8 g/dL (ref 3.5–5.2)
Alkaline Phosphatase: 70 U/L (ref 39–117)
BUN: 13 mg/dL (ref 6–23)
CO2: 25 mEq/L (ref 19–32)
Calcium: 9.1 mg/dL (ref 8.4–10.5)
Chloride: 103 mEq/L (ref 96–112)
Creatinine, Ser: 0.8 mg/dL (ref 0.4–1.2)
GFR: 76.15 mL/min (ref >60.00)
Glucose, Bld: 123 mg/dL — ABNORMAL HIGH (ref 70–99)
Potassium: 3.7 mEq/L (ref 3.5–5.1)
Sodium: 138 mEq/L (ref 135–145)
Total Bilirubin: 0.8 mg/dL (ref 0.3–1.2)
Total Protein: 7.7 g/dL (ref 6.0–8.3)

## 2013-08-15 LAB — LIPID PANEL
Cholesterol: 204 mg/dL — ABNORMAL HIGH (ref 0–200)
HDL: 62.3 mg/dL (ref >39.00)
Total CHOL/HDL Ratio: 3
Triglycerides: 170 mg/dL — ABNORMAL HIGH (ref 0.0–149.0)
VLDL: 34 mg/dL (ref 0.0–40.0)

## 2013-08-15 LAB — FOLATE: Folate: 7.2 ng/mL (ref >5.9)

## 2013-08-15 LAB — VITAMIN B12: Vitamin B-12: 188 pg/mL — ABNORMAL LOW (ref 211–911)

## 2013-08-15 LAB — HEMOGLOBIN A1C: Hgb A1c MFr Bld: 6.2 % (ref 4.6–6.5)

## 2013-08-15 LAB — TSH: TSH: 2.21 u[IU]/mL (ref 0.35–5.50)

## 2013-08-15 LAB — HM DIABETES FOOT EXAM

## 2013-08-15 LAB — LDL CHOLESTEROL, DIRECT: Direct LDL: 119.8 mg/dL

## 2013-08-15 MED ORDER — LOSARTAN POTASSIUM 100 MG PO TABS: 100.0000 mg | ORAL_TABLET | Freq: Every day | ORAL | Status: DC

## 2013-08-15 NOTE — Progress Notes (Signed)
Pre-visit discussion using our clinic review tool. No additional management support is needed unless otherwise documented below in the visit note.  

## 2013-08-15 NOTE — Assessment & Plan Note (Signed)
I will check an MRI of her lumbar spine to see if there is any pathology there She will see NS next week If these do not answer the cause of her urinary incontinence than I have asked her to see urology

## 2013-08-15 NOTE — Assessment & Plan Note (Signed)
I will check an MRI to see what is causing this

## 2013-08-15 NOTE — Assessment & Plan Note (Signed)
I will check her CBC and B12 level today

## 2013-08-15 NOTE — Patient Instructions (Signed)

## 2013-08-15 NOTE — Assessment & Plan Note (Signed)
This may be causing some of her symptoms She sees NS next week

## 2013-08-15 NOTE — Progress Notes (Signed)
Subjective:    Patient ID: [name], female    DOB: [birth year], 68 y.o.   MRN: [id number]  HPI Comments: She returns after a long absence and complains of worsening urinary incontinence over the last year and numbness in her left foot.  Diabetes She presents for her follow-up diabetic visit. She has type 2 diabetes mellitus. Her disease course has been fluctuating. Pertinent negatives for hypoglycemia include no dizziness, headaches, seizures, speech difficulty or tremors. Associated symptoms include foot paresthesias. Pertinent negatives for diabetes include no blurred vision, no chest pain, no fatigue, no foot ulcerations, no polydipsia, no polyphagia, no polyuria, no visual change, no weakness and no weight loss. Symptoms are stable. Current diabetic treatment includes oral agent (monotherapy). She is compliant with treatment all of the time. Her weight is stable. She is following a generally unhealthy diet. When asked about meal planning, she reported none. She has not had a previous visit with a dietician. She never participates in exercise. There is no change in her home blood glucose trend. An ACE inhibitor/angiotensin II receptor blocker is not being taken. She does not see a podiatrist.Eye exam is current.      Review of Systems  Constitutional: Negative.  Negative for fever, chills, weight loss, diaphoresis, appetite change and fatigue.  HENT: Negative.   Eyes: Negative.  Negative for blurred vision.  Respiratory: Negative.  Negative for apnea, cough, choking, chest tightness, shortness of breath, wheezing and stridor.   Cardiovascular: Negative.  Negative for chest pain, palpitations and leg swelling.  Gastrointestinal: Negative.  Negative for nausea, vomiting, abdominal pain, diarrhea, constipation, abdominal distention and anal bleeding.  Endocrine: Negative.  Negative for polydipsia, polyphagia and polyuria.  Genitourinary: Positive for enuresis. Negative for dysuria,  urgency, frequency, hematuria, flank pain, decreased urine volume, vaginal bleeding, vaginal discharge, difficulty urinating, genital sores, vaginal pain, menstrual problem, pelvic pain and dyspareunia.  Musculoskeletal: Positive for back pain (occasional low back ache) and gait problem (chronic, mild ataxia). Negative for arthralgias, joint swelling, myalgias, neck pain and neck stiffness.  Skin: Negative.   Allergic/Immunologic: Negative.   Neurological: Positive for numbness. Negative for dizziness, tremors, seizures, syncope, speech difficulty, weakness, light-headedness and headaches.  Hematological: Negative.  Negative for adenopathy. Does not bruise/bleed easily.  Psychiatric/Behavioral: Negative.        Objective:   Physical Exam  Vitals reviewed. Constitutional: She is oriented to person, place, and time. She appears well-developed and well-nourished. No distress.  HENT:  Head: Normocephalic and atraumatic.  Mouth/Throat: Oropharynx is clear and moist. No oropharyngeal exudate.  Eyes: Conjunctivae are normal. Right eye exhibits no discharge. Left eye exhibits no discharge. No scleral icterus.  Neck: Normal range of motion. Neck supple. No JVD present. No tracheal deviation present. No thyromegaly present.  Cardiovascular: Normal rate, regular rhythm, normal heart sounds and intact distal pulses.  Exam reveals no gallop and no friction rub.   No murmur heard. Pulmonary/Chest: Effort normal and breath sounds normal. No stridor. No respiratory distress. She has no wheezes. She has no rales. She exhibits no tenderness.  Abdominal: Soft. Bowel sounds are normal. She exhibits no distension and no mass. There is no tenderness. There is no rebound and no guarding.  Musculoskeletal: Normal range of motion. She exhibits no edema and no tenderness.  Lymphadenopathy:    She has no cervical adenopathy.  Neurological: She is alert and oriented to person, place, and time. She has normal strength.  She displays abnormal reflex. She displays no atrophy and no  tremor. No cranial nerve deficit or sensory deficit. She exhibits normal muscle tone. She displays a negative Romberg sign. She displays no seizure activity. Coordination normal. Abnormal gait: mild ataxia.  Reflex Scores:      Tricep reflexes are 1+ on the right side and 1+ on the left side.      Bicep reflexes are 1+ on the right side and 1+ on the left side.      Brachioradialis reflexes are 1+ on the right side and 1+ on the left side.      Patellar reflexes are 1+ on the right side and 1+ on the left side.      Achilles reflexes are 1+ on the right side and 0 on the left side. Skin: Skin is warm and dry. No rash noted. She is not diaphoretic. No erythema. No pallor.  Psychiatric: She has a normal mood and affect. Her behavior is normal. Judgment and thought content normal.     Lab Results  Component Value Date   WBC 6.4 07/03/2010   HGB 15.6* 07/03/2010   HCT 45.2 07/03/2010   PLT 292 07/03/2010   GLUCOSE 126* 07/03/2010   ALT 17 05/22/2010   AST 19 05/22/2010   NA 136 07/03/2010   K 3.8 07/03/2010   CL 103 07/03/2010   CREATININE 0.63 07/03/2010   BUN 15 07/03/2010   CO2 24 07/03/2010   TSH 2.86 05/22/2010   HGBA1C  Value: 5.6 (NOTE) The ADA recommends the following therapeutic goal for glycemic control related to Hgb A1c measurement: Goal of therapy: <6.5 Hgb A1c  Reference: American Diabetes Association: Clinical Practice Recommendations 2010, Diabetes Care, 2010, 33: (Suppl  1). 01/26/2009       Assessment & Plan:

## 2013-08-15 NOTE — Assessment & Plan Note (Signed)
I will check her A1C and will monitor her renal function

## 2013-08-15 NOTE — Assessment & Plan Note (Signed)
Her BP is not well controlled I have asked her to start losartan

## 2013-08-16 ENCOUNTER — Encounter: Payer: Self-pay | Admitting: Internal Medicine

## 2013-08-16 LAB — URINALYSIS, ROUTINE W REFLEX MICROSCOPIC
Bilirubin Urine: NEGATIVE
Hgb urine dipstick: NEGATIVE
Ketones, ur: NEGATIVE
Leukocytes, UA: NEGATIVE
Nitrite: NEGATIVE
Specific Gravity, Urine: 1.02 (ref 1.000–1.030)
Total Protein, Urine: NEGATIVE
Urine Glucose: NEGATIVE
Urobilinogen, UA: 0.2 (ref 0.0–1.0)
pH: 6 (ref 5.0–8.0)

## 2013-08-16 MED ORDER — CYANOCOBALAMIN 500 MCG/0.1ML NA SOLN: 0.1000 mL | NASAL | Status: DC

## 2013-08-16 NOTE — Addendum Note (Signed)
Addended by: Janith Lima on: 08/16/2013 12:40 PM   Modules accepted: Orders

## 2013-08-23 ENCOUNTER — Ambulatory Visit
Admission: RE | Admit: 2013-08-23 | Discharge: 2013-08-23 | Disposition: A | Discharge status: Home / Self Care | Payer: 59 | Source: Ambulatory Visit | Attending: Internal Medicine | Admitting: Internal Medicine

## 2013-08-23 DIAGNOSIS — R32 Unspecified urinary incontinence: Secondary | ICD-10-CM

## 2013-08-23 DIAGNOSIS — R202 Paresthesia of skin: Secondary | ICD-10-CM

## 2013-08-23 DIAGNOSIS — R200 Anesthesia of skin: Secondary | ICD-10-CM

## 2013-08-27 ENCOUNTER — Ambulatory Visit: Payer: BC Managed Care – PPO | Admitting: Internal Medicine

## 2013-08-27 DIAGNOSIS — Z0289 Encounter for other administrative examinations: Secondary | ICD-10-CM

## 2013-08-29 ENCOUNTER — Other Ambulatory Visit (INDEPENDENT_AMBULATORY_CARE_PROVIDER_SITE_OTHER): Payer: 59

## 2013-08-29 ENCOUNTER — Ambulatory Visit (INDEPENDENT_AMBULATORY_CARE_PROVIDER_SITE_OTHER): Payer: 59 | Admitting: Internal Medicine

## 2013-08-29 VITALS — BP 132/84 | HR 115 | Temp 97.7°F | Resp 16 | Ht 67.0 in

## 2013-08-29 DIAGNOSIS — N390 Urinary tract infection, site not specified: Secondary | ICD-10-CM

## 2013-08-29 DIAGNOSIS — E538 Deficiency of other specified B group vitamins: Secondary | ICD-10-CM

## 2013-08-29 DIAGNOSIS — IMO0001 Reserved for inherently not codable concepts without codable children: Secondary | ICD-10-CM

## 2013-08-29 DIAGNOSIS — R300 Dysuria: Secondary | ICD-10-CM

## 2013-08-29 DIAGNOSIS — R32 Unspecified urinary incontinence: Secondary | ICD-10-CM

## 2013-08-29 DIAGNOSIS — E1165 Type 2 diabetes mellitus with hyperglycemia: Secondary | ICD-10-CM

## 2013-08-29 LAB — URINALYSIS, ROUTINE W REFLEX MICROSCOPIC
Bilirubin Urine: NEGATIVE
Ketones, ur: NEGATIVE
Nitrite: NEGATIVE
Specific Gravity, Urine: 1.02 (ref 1.000–1.030)
Urine Glucose: NEGATIVE
Urobilinogen, UA: 0.2 (ref 0.0–1.0)
pH: 6 (ref 5.0–8.0)

## 2013-08-29 MED ORDER — CIPROFLOXACIN HCL 500 MG PO TABS
500.0000 mg | ORAL_TABLET | Freq: Two times a day (BID) | ORAL | Status: AC
Start: 2013-08-29 — End: 2013-09-03

## 2013-08-29 MED ORDER — CYANOCOBALAMIN 1000 MCG/ML IJ SOLN
1000.0000 ug | Freq: Once | INTRAMUSCULAR | Status: AC
  Administered 2013-08-29: 1000 ug via INTRAMUSCULAR

## 2013-08-29 NOTE — Patient Instructions (Signed)
Type 2 Diabetes Mellitus, Adult Type 2 diabetes mellitus, often simply referred to as type 2 diabetes, is a long-lasting (chronic) disease. In type 2 diabetes, the pancreas does not make enough insulin (a hormone), the cells are less responsive to the insulin that is made (insulin resistance), or both. Normally, insulin moves sugars from food into the tissue cells. The tissue cells use the sugars for energy. The lack of insulin or the lack of normal response to insulin causes excess sugars to build up in the blood instead of going into the tissue cells. As a result, high blood sugar (hyperglycemia) develops. The effect of high sugar (glucose) levels can cause many complications. Type 2 diabetes was also previously called adult-onset diabetes but it can occur at any age.  RISK FACTORS  A person is predisposed to developing type 2 diabetes if someone in the family has the disease and also has one or more of the following primary risk factors:  Overweight.  An inactive lifestyle.  A history of consistently eating high-calorie foods. Maintaining a normal weight and regular physical activity can reduce the chance of developing type 2 diabetes. SYMPTOMS  A person with type 2 diabetes may not show symptoms initially. The symptoms of type 2 diabetes appear slowly. The symptoms include:  Increased thirst (polydipsia).  Increased urination (polyuria).  Increased urination during the night (nocturia).  Weight loss. This weight loss may be rapid.  Frequent, recurring infections.  Tiredness (fatigue).  Weakness.  Vision changes, such as blurred vision.  Fruity smell to your breath.  Abdominal pain.  Nausea or vomiting.  Cuts or bruises which are slow to heal.  Tingling or numbness in the hands or feet. DIAGNOSIS Type 2 diabetes is frequently not diagnosed until complications of diabetes are present. Type 2 diabetes is diagnosed when symptoms or complications are present and when blood  glucose levels are increased. Your blood glucose level may be checked by one or more of the following blood tests:  A fasting blood glucose test. You will not be allowed to eat for at least 8 hours before a blood sample is taken.  A random blood glucose test. Your blood glucose is checked at any time of the day regardless of when you ate.  A hemoglobin A1c blood glucose test. A hemoglobin A1c test provides information about blood glucose control over the previous 3 months.  An oral glucose tolerance test (OGTT). Your blood glucose is measured after you have not eaten (fasted) for 2 hours and then after you drink a glucose-containing beverage. TREATMENT   You may need to take insulin or diabetes medicine daily to keep blood glucose levels in the desired range.  You will need to match insulin dosing with exercise and healthy food choices. The treatment goal is to maintain the before meal blood sugar (preprandial glucose) level at 70 130 mg/dL. HOME CARE INSTRUCTIONS   Have your hemoglobin A1c level checked twice a year.  Perform daily blood glucose monitoring as directed by your caregiver.  Monitor urine ketones when you are ill and as directed by your caregiver.  Take your diabetes medicine or insulin as directed by your caregiver to maintain your blood glucose levels in the desired range.  Never run out of diabetes medicine or insulin. It is needed every day.  Adjust insulin based on your intake of carbohydrates. Carbohydrates can raise blood glucose levels but need to be included in your diet. Carbohydrates provide vitamins, minerals, and fiber which are an essential part of   a healthy diet. Carbohydrates are found in fruits, vegetables, whole grains, dairy products, legumes, and foods containing added sugars.    Eat healthy foods. Alternate 3 meals with 3 snacks.  Lose weight if overweight.  Carry a medical alert card or wear your medical alert jewelry.  Carry a 15 gram  carbohydrate snack with you at all times to treat low blood glucose (hypoglycemia). Some examples of 15 gram carbohydrate snacks include:  Glucose tablets, 3 or 4   Glucose gel, 15 gram tube  Raisins, 2 tablespoons (24 grams)  Jelly beans, 6  Animal crackers, 8  Regular pop, 4 ounces (120 mL)  Gummy treats, 9  Recognize hypoglycemia. Hypoglycemia occurs with blood glucose levels of 70 mg/dL and below. The risk for hypoglycemia increases when fasting or skipping meals, during or after intense exercise, and during sleep. Hypoglycemia symptoms can include:  Tremors or shakes.  Decreased ability to concentrate.  Sweating.  Increased heart rate.  Headache.  Dry mouth.  Hunger.  Irritability.  Anxiety.  Restless sleep.  Altered speech or coordination.  Confusion.  Treat hypoglycemia promptly. If you are alert and able to safely swallow, follow the 15:15 rule:  Take 15 20 grams of rapid-acting glucose or carbohydrate. Rapid-acting options include glucose gel, glucose tablets, or 4 ounces (120 mL) of fruit juice, regular soda, or low fat milk.  Check your blood glucose level 15 minutes after taking the glucose.  Take 15 20 grams more of glucose if the repeat blood glucose level is still 70 mg/dL or below.  Eat a meal or snack within 1 hour once blood glucose levels return to normal.    Be alert to polyuria and polydipsia which are early signs of hyperglycemia. An early awareness of hyperglycemia allows for prompt treatment. Treat hyperglycemia as directed by your caregiver.  Engage in at least 150 minutes of moderate-intensity physical activity a week, spread over at least 3 days of the week or as directed by your caregiver. In addition, you should engage in resistance exercise at least 2 times a week or as directed by your caregiver.  Adjust your medicine and food intake as needed if you start a new exercise or sport.  Follow your sick day plan at any time you  are unable to eat or drink as usual.  Avoid tobacco use.  Limit alcohol intake to no more than 1 drink per day for nonpregnant women and 2 drinks per day for men. You should drink alcohol only when you are also eating food. Talk with your caregiver whether alcohol is safe for you. Tell your caregiver if you drink alcohol several times a week.  Follow up with your caregiver regularly.  Schedule an eye exam soon after the diagnosis of type 2 diabetes and then annually.  Perform daily skin and foot care. Examine your skin and feet daily for cuts, bruises, redness, nail problems, bleeding, blisters, or sores. A foot exam by a caregiver should be done annually.  Brush your teeth and gums at least twice a day and floss at least once a day. Follow up with your dentist regularly.  Share your diabetes management plan with your workplace or school.  Stay up-to-date with immunizations.  Learn to manage stress.  Obtain ongoing diabetes education and support as needed.  Participate in, or seek rehabilitation as needed to maintain or improve independence and quality of life. Request a physical or occupational therapy referral if you are having foot or hand numbness or difficulties with grooming,   dressing, eating, or physical activity. SEEK MEDICAL CARE IF:   You are unable to eat food or drink fluids for more than 6 hours.  You have nausea and vomiting for more than 6 hours.  Your blood glucose level is over 240 mg/dL.  There is a change in mental status.  You develop an additional serious illness.  You have diarrhea for more than 6 hours.  You have been sick or have had a fever for a couple of days and are not getting better.  You have pain during any physical activity.  SEEK IMMEDIATE MEDICAL CARE IF:  You have difficulty breathing.  You have moderate to large ketone levels. MAKE SURE YOU:  Understand these instructions.  Will watch your condition.  Will get help right away if  you are not doing well or get worse. Document Released: 07/19/2005 Document Revised: 04/12/2012 Document Reviewed: 02/15/2012 [employer] Patient Information 2014 [employer].  

## 2013-08-29 NOTE — Progress Notes (Signed)
Subjective:    Patient ID: [name], female    DOB: 11/30/1945, 68 y.o.   MRN: [id number]  Dysuria  This is a new problem. The current episode started in the past 7 days. The problem has been gradually worsening. The quality of the pain is described as burning. The pain is at a severity of 1/10. The pain is mild. There has been no fever. The fever has been present for less than 1 day. She is not sexually active. There is no history of pyelonephritis. Associated symptoms include frequency, hematuria and urgency. Pertinent negatives include no chills, discharge, flank pain, hesitancy, nausea, possible pregnancy, sweats or vomiting. She has tried nothing for the symptoms. The treatment provided no relief. Her past medical history is significant for urinary stasis. There is no history of catheterization, kidney stones, recurrent UTIs, a single kidney or a urological procedure.      Review of Systems  Constitutional: Negative.  Negative for fever, chills, diaphoresis and fatigue.  HENT: Negative.   Eyes: Negative.   Respiratory: Negative.  Negative for cough, chest tightness, shortness of breath, wheezing and stridor.   Cardiovascular: Negative.  Negative for chest pain, palpitations and leg swelling.  Gastrointestinal: Negative.  Negative for nausea, vomiting and abdominal pain.  Endocrine: Negative.   Genitourinary: Positive for dysuria, urgency, frequency and hematuria. Negative for hesitancy and flank pain.  Musculoskeletal: Positive for back pain. Negative for arthralgias, gait problem, joint swelling, myalgias, neck pain and neck stiffness.  Skin: Negative.   Allergic/Immunologic: Negative.   Neurological: Negative.   Hematological: Negative.  Negative for adenopathy. Does not bruise/bleed easily.  Psychiatric/Behavioral: Negative.        Objective:   Physical Exam  Vitals reviewed. Constitutional: She is oriented to person, place, and time. She appears well-developed and  well-nourished. No distress.  HENT:  Head: Normocephalic and atraumatic.  Mouth/Throat: Oropharynx is clear and moist. No oropharyngeal exudate.  Eyes: Conjunctivae are normal. Right eye exhibits no discharge. Left eye exhibits no discharge. No scleral icterus.  Neck: Normal range of motion. Neck supple. No JVD present. No tracheal deviation present. No thyromegaly present.  Cardiovascular: Normal rate, regular rhythm, normal heart sounds and intact distal pulses.  Exam reveals no gallop and no friction rub.   No murmur heard. Pulmonary/Chest: Breath sounds normal. No stridor. No respiratory distress. She has no wheezes. She has no rales. She exhibits no tenderness.  Abdominal: Soft. Bowel sounds are normal. She exhibits no distension and no mass. There is no hepatosplenomegaly. There is no tenderness. There is no rebound, no guarding and no CVA tenderness.  Musculoskeletal: Normal range of motion. She exhibits no edema and no tenderness.  Lymphadenopathy:    She has no cervical adenopathy.  Neurological: She is oriented to person, place, and time.  Skin: Skin is warm and dry. No rash noted. She is not diaphoretic. No erythema. No pallor.     Lab Results  Component Value Date   WBC 6.2 08/15/2013   HGB 15.0 08/15/2013   HCT 43.1 08/15/2013   PLT 273.0 08/15/2013   GLUCOSE 123* 08/15/2013   CHOL 204* 08/15/2013   TRIG 170.0* 08/15/2013   HDL 62.30 08/15/2013   LDLDIRECT 119.8 08/15/2013   ALT 21 08/15/2013   AST 18 08/15/2013   NA 138 08/15/2013   K 3.7 08/15/2013   CL 103 08/15/2013   CREATININE 0.8 08/15/2013   BUN 13 08/15/2013   CO2 25 08/15/2013   TSH 2.21 08/15/2013  HGBA1C 6.2 08/15/2013       Assessment & Plan:

## 2013-08-30 ENCOUNTER — Encounter: Payer: Self-pay | Admitting: Internal Medicine

## 2013-08-30 NOTE — Assessment & Plan Note (Signed)
Will treat with cipro 

## 2013-08-30 NOTE — Assessment & Plan Note (Signed)
This may be related to the NPH, lumbar pathology, B12 deficiency - I have asked her to see urology for further evaluation

## 2013-08-30 NOTE — Assessment & Plan Note (Signed)
Her UA is positive for evidence of infection

## 2013-08-30 NOTE — Assessment & Plan Note (Signed)
Metformin has caused diarrhea and her A1C is down to 6.2 so I have asked her to stop metformin

## 2013-08-30 NOTE — Assessment & Plan Note (Signed)
B12 injection today Cont Nascobal

## 2013-08-31 LAB — CULTURE, URINE COMPREHENSIVE: Colony Count: >=100000

## 2013-09-11 ENCOUNTER — Telehealth: Payer: Self-pay | Admitting: *Deleted

## 2013-09-11 MED ORDER — FLUCONAZOLE 150 MG PO TABS: 150.0000 mg | ORAL_TABLET | Freq: Once | ORAL | Status: DC

## 2013-09-11 NOTE — Telephone Encounter (Signed)
Patient phoned stating after 10 days of cipro, she had developed a yeast infxn.  Requests something be called in. Diflucan?  Please advise.   CB# [phone]

## 2013-09-11 NOTE — Telephone Encounter (Signed)
done

## 2013-09-11 NOTE — Telephone Encounter (Signed)
Phoned and notified patient of MD action

## 2013-09-12 ENCOUNTER — Ambulatory Visit: Payer: 59 | Admitting: Internal Medicine

## 2013-09-19 ENCOUNTER — Encounter: Payer: Self-pay | Admitting: Internal Medicine

## 2013-09-19 ENCOUNTER — Ambulatory Visit (INDEPENDENT_AMBULATORY_CARE_PROVIDER_SITE_OTHER): Payer: 59 | Admitting: Internal Medicine

## 2013-09-19 VITALS — BP 130/82 | HR 109 | Temp 97.5°F | Resp 16 | Ht 67.0 in | Wt 252.8 lb

## 2013-09-19 DIAGNOSIS — I10 Essential (primary) hypertension: Secondary | ICD-10-CM

## 2013-09-19 DIAGNOSIS — N390 Urinary tract infection, site not specified: Secondary | ICD-10-CM

## 2013-09-19 DIAGNOSIS — R32 Unspecified urinary incontinence: Secondary | ICD-10-CM

## 2013-09-19 NOTE — Assessment & Plan Note (Signed)
This has resolved.

## 2013-09-19 NOTE — Progress Notes (Signed)
Pre visit review using our clinic review tool, if applicable. No additional management support is needed unless otherwise documented below in the visit note. 

## 2013-09-19 NOTE — Patient Instructions (Signed)

## 2013-09-19 NOTE — Assessment & Plan Note (Signed)
Work up is in progress.

## 2013-09-19 NOTE — Progress Notes (Signed)
   Subjective:    Patient ID: [name], female    DOB: April 25, 1946, 68 y.o.   MRN: [id number]  HPI Comments: She returns for f/up and she tells me that she feels well and offers no complaints. She saw urology yesterday and tells me that an u/s of her bladder was normal. She will be having urodynamics done soon.     Review of Systems  Constitutional: Negative.  Negative for fever, chills, diaphoresis, appetite change and fatigue.  HENT: Negative.   Eyes: Negative.   Respiratory: Negative.  Negative for cough, chest tightness, shortness of breath and stridor.   Cardiovascular: Negative.  Negative for chest pain, palpitations and leg swelling.  Gastrointestinal: Negative.  Negative for nausea, vomiting, abdominal pain, diarrhea, constipation and blood in stool.  Endocrine: Negative.   Genitourinary: Negative.  Negative for dysuria, urgency, frequency, hematuria, flank pain, decreased urine volume, enuresis, difficulty urinating and pelvic pain.       She has not had ANY urinary complaints for the last 3 days.  Musculoskeletal: Negative.  Negative for arthralgias.  Skin: Negative.   Allergic/Immunologic: Negative.   Neurological: Negative.   Hematological: Negative.  Negative for adenopathy. Does not bruise/bleed easily.  Psychiatric/Behavioral: Negative.        Objective:   Physical Exam  Vitals reviewed. Constitutional: She is oriented to person, place, and time. She appears well-developed and well-nourished. No distress.  HENT:  Head: Normocephalic and atraumatic.  Mouth/Throat: Oropharynx is clear and moist. No oropharyngeal exudate.  Eyes: Conjunctivae are normal. Right eye exhibits no discharge. Left eye exhibits no discharge. No scleral icterus.  Neck: Normal range of motion. Neck supple. No JVD present. No tracheal deviation present. No thyromegaly present.  Cardiovascular: Normal rate, regular rhythm, normal heart sounds and intact distal pulses.  Exam reveals no  gallop and no friction rub.   No murmur heard. Pulmonary/Chest: Effort normal and breath sounds normal. No stridor. No respiratory distress. She has no wheezes. She has no rales. She exhibits no tenderness.  Abdominal: Soft. Bowel sounds are normal. She exhibits no distension and no mass. There is no tenderness. There is no rebound and no guarding.  Musculoskeletal: Normal range of motion. She exhibits no edema and no tenderness.  Lymphadenopathy:    She has no cervical adenopathy.  Neurological: She is oriented to person, place, and time.  Skin: Skin is warm and dry. No rash noted. She is not diaphoretic. No erythema. No pallor.     Lab Results  Component Value Date   WBC 6.2 08/15/2013   HGB 15.0 08/15/2013   HCT 43.1 08/15/2013   PLT 273.0 08/15/2013   GLUCOSE 123* 08/15/2013   CHOL 204* 08/15/2013   TRIG 170.0* 08/15/2013   HDL 62.30 08/15/2013   LDLDIRECT 119.8 08/15/2013   ALT 21 08/15/2013   AST 18 08/15/2013   NA 138 08/15/2013   K 3.7 08/15/2013   CL 103 08/15/2013   CREATININE 0.8 08/15/2013   BUN 13 08/15/2013   CO2 25 08/15/2013   TSH 2.21 08/15/2013   HGBA1C 6.2 08/15/2013       Assessment & Plan:

## 2013-09-19 NOTE — Assessment & Plan Note (Signed)
Her BP is well controlled 

## 2013-11-05 ENCOUNTER — Ambulatory Visit: Payer: 59 | Admitting: Internal Medicine

## 2013-12-27 ENCOUNTER — Ambulatory Visit (INDEPENDENT_AMBULATORY_CARE_PROVIDER_SITE_OTHER)
Admission: RE | Admit: 2013-12-27 | Discharge: 2013-12-27 | Disposition: A | Discharge status: Home / Self Care | Payer: 59 | Source: Ambulatory Visit | Attending: Internal Medicine | Admitting: Internal Medicine

## 2013-12-27 ENCOUNTER — Encounter: Payer: Self-pay | Admitting: Internal Medicine

## 2013-12-27 ENCOUNTER — Other Ambulatory Visit (INDEPENDENT_AMBULATORY_CARE_PROVIDER_SITE_OTHER): Payer: 59

## 2013-12-27 ENCOUNTER — Ambulatory Visit (INDEPENDENT_AMBULATORY_CARE_PROVIDER_SITE_OTHER): Payer: 59 | Admitting: Internal Medicine

## 2013-12-27 VITALS — BP 120/80 | HR 53 | Temp 98.0°F | Resp 16 | Ht 67.0 in | Wt 263.0 lb

## 2013-12-27 DIAGNOSIS — Z23 Encounter for immunization: Secondary | ICD-10-CM

## 2013-12-27 DIAGNOSIS — E2839 Other primary ovarian failure: Secondary | ICD-10-CM | POA: Insufficient documentation

## 2013-12-27 DIAGNOSIS — E559 Vitamin D deficiency, unspecified: Secondary | ICD-10-CM

## 2013-12-27 DIAGNOSIS — M542 Cervicalgia: Secondary | ICD-10-CM

## 2013-12-27 DIAGNOSIS — E538 Deficiency of other specified B group vitamins: Secondary | ICD-10-CM

## 2013-12-27 DIAGNOSIS — R7309 Other abnormal glucose: Secondary | ICD-10-CM

## 2013-12-27 DIAGNOSIS — M949 Disorder of cartilage, unspecified: Secondary | ICD-10-CM

## 2013-12-27 DIAGNOSIS — Z0000 Encounter for general adult medical examination without abnormal findings: Secondary | ICD-10-CM

## 2013-12-27 DIAGNOSIS — M8580 Other specified disorders of bone density and structure, unspecified site: Secondary | ICD-10-CM

## 2013-12-27 DIAGNOSIS — I10 Essential (primary) hypertension: Secondary | ICD-10-CM

## 2013-12-27 DIAGNOSIS — Z1231 Encounter for screening mammogram for malignant neoplasm of breast: Secondary | ICD-10-CM

## 2013-12-27 DIAGNOSIS — M899 Disorder of bone, unspecified: Secondary | ICD-10-CM

## 2013-12-27 LAB — BASIC METABOLIC PANEL
BUN: 18 mg/dL (ref 6–23)
CO2: 27 mEq/L (ref 19–32)
Calcium: 8.8 mg/dL (ref 8.4–10.5)
Chloride: 104 mEq/L (ref 96–112)
Creatinine, Ser: 0.8 mg/dL (ref 0.4–1.2)
GFR: 72.91 mL/min (ref >60.00)
Glucose, Bld: 133 mg/dL — ABNORMAL HIGH (ref 70–99)
Potassium: 3.5 mEq/L (ref 3.5–5.1)
Sodium: 142 mEq/L (ref 135–145)

## 2013-12-27 LAB — CBC WITH DIFFERENTIAL/PLATELET
Basophils Absolute: 0 10*3/uL (ref 0.0–0.1)
Basophils Relative: 0.4 % (ref 0.0–3.0)
Eosinophils Absolute: 0.2 10*3/uL (ref 0.0–0.7)
Eosinophils Relative: 2.7 % (ref 0.0–5.0)
HCT: 42.4 % (ref 36.0–46.0)
Hemoglobin: 14.8 g/dL (ref 12.0–15.0)
Lymphocytes Relative: 20.9 % (ref 12.0–46.0)
Lymphs Abs: 1.4 10*3/uL (ref 0.7–4.0)
MCHC: 34.9 g/dL (ref 30.0–36.0)
MCV: 89.2 fl (ref 78.0–100.0)
Monocytes Absolute: 0.4 10*3/uL (ref 0.1–1.0)
Monocytes Relative: 5.7 % (ref 3.0–12.0)
Neutro Abs: 4.8 10*3/uL (ref 1.4–7.7)
Neutrophils Relative %: 70.3 % (ref 43.0–77.0)
Platelets: 268 10*3/uL (ref 150.0–400.0)
RBC: 4.76 Mil/uL (ref 3.87–5.11)
RDW: 13 % (ref 11.5–15.5)
WBC: 6.8 10*3/uL (ref 4.0–10.5)

## 2013-12-27 LAB — HEMOGLOBIN A1C: Hgb A1c MFr Bld: 6.4 % (ref 4.6–6.5)

## 2013-12-27 MED ORDER — HYDROCODONE-ACETAMINOPHEN 5-325 MG PO TABS: 1.0000 | ORAL_TABLET | Freq: Four times a day (QID) | ORAL | Status: DC | PRN

## 2013-12-27 MED ORDER — METAXALONE 800 MG PO TABS: 800.0000 mg | ORAL_TABLET | Freq: Four times a day (QID) | ORAL | Status: DC

## 2013-12-27 NOTE — Progress Notes (Signed)
Pre visit review using our clinic review tool, if applicable. No additional management support is needed unless otherwise documented below in the visit note. 

## 2013-12-27 NOTE — Assessment & Plan Note (Signed)
Her BP is well controlled 

## 2013-12-27 NOTE — Assessment & Plan Note (Signed)
I will check her Vit D level She is due for a DEXA scan

## 2013-12-27 NOTE — Assessment & Plan Note (Signed)
I will recheck her CBC today 

## 2013-12-27 NOTE — Assessment & Plan Note (Addendum)

## 2013-12-27 NOTE — Assessment & Plan Note (Signed)
She will cont the current meds for pain relief I have ordered a plain film to see if there is DDD, spurring, etc

## 2013-12-27 NOTE — Progress Notes (Signed)
Subjective:    Patient ID: [name], female    DOB: 06/08/1946, 68 y.o.   MRN: [id number]  Neck Pain  This is a recurrent problem. The current episode started more than 1 year ago. The problem occurs intermittently. The problem has been unchanged. The pain is associated with nothing. The pain is present in the right side. The quality of the pain is described as stabbing and aching. The pain is at a severity of 4/10. The pain is mild. The symptoms are aggravated by position. The pain is same all the time. Associated symptoms include headaches (chronic headaches since childhood). Pertinent negatives include no chest pain, fever, numbness, pain with swallowing, paresis, photophobia, tingling, trouble swallowing, visual change or weakness. She has tried muscle relaxants and oral narcotics for the symptoms. The treatment provided significant relief.      Review of Systems  Constitutional: Negative.  Negative for fever, chills, diaphoresis, appetite change and fatigue.  HENT: Negative for trouble swallowing.   Eyes: Negative.  Negative for photophobia.  Respiratory: Negative.  Negative for cough, choking, chest tightness, shortness of breath, wheezing and stridor.   Cardiovascular: Negative.  Negative for chest pain, palpitations and leg swelling.  Gastrointestinal: Negative.  Negative for nausea, vomiting, abdominal pain, diarrhea, constipation and blood in stool.  Endocrine: Negative.  Negative for polydipsia, polyphagia and polyuria.  Genitourinary: Negative.  Negative for dysuria, urgency, frequency, hematuria, flank pain and decreased urine volume.  Musculoskeletal: Positive for back pain, neck pain and neck stiffness. Negative for arthralgias, gait problem, joint swelling and myalgias.  Skin: Negative.  Negative for pallor.  Allergic/Immunologic: Negative.   Neurological: Positive for headaches (chronic headaches since childhood). Negative for dizziness, tingling, tremors, seizures,  syncope, facial asymmetry, speech difficulty, weakness, light-headedness and numbness.  Hematological: Negative.  Negative for adenopathy. Does not bruise/bleed easily.  Psychiatric/Behavioral: Positive for sleep disturbance and dysphoric mood. Negative for suicidal ideas, hallucinations, behavioral problems, confusion, self-injury, decreased concentration and agitation. The patient is nervous/anxious. The patient is not hyperactive.        Objective:   Physical Exam  Vitals reviewed. Constitutional: She is oriented to person, place, and time. She appears well-developed and well-nourished.  Non-toxic appearance. She does not have a sickly appearance. She does not appear ill. No distress.  HENT:  Head: Normocephalic and atraumatic.  Mouth/Throat: Oropharynx is clear and moist. No oropharyngeal exudate.  Eyes: Conjunctivae are normal. Right eye exhibits no discharge. Left eye exhibits no discharge. No scleral icterus.  Neck: Normal range of motion. Neck supple. No JVD present. No tracheal deviation present. No thyromegaly present.  Cardiovascular: Normal rate, regular rhythm, normal heart sounds and intact distal pulses.  Exam reveals no gallop and no friction rub.   No murmur heard. Pulmonary/Chest: Effort normal and breath sounds normal. No stridor. No respiratory distress. She has no wheezes. She has no rales. Chest wall is not dull to percussion. She exhibits no mass, no tenderness, no bony tenderness, no laceration, no crepitus, no edema, no deformity, no swelling and no retraction. Right breast exhibits no inverted nipple, no mass, no nipple discharge, no skin change and no tenderness. Left breast exhibits no inverted nipple, no mass, no nipple discharge, no skin change and no tenderness. Breasts are symmetrical.  Abdominal: Soft. Bowel sounds are normal. She exhibits no distension and no mass. There is no tenderness. There is no rebound and no guarding.  Musculoskeletal: Normal range of  motion. She exhibits no edema and no tenderness.  Cervical back: Normal. She exhibits normal range of motion, no tenderness, no bony tenderness, no swelling, no edema, no deformity, no laceration, no pain, no spasm and normal pulse.  Lymphadenopathy:    She has no cervical adenopathy.  Neurological: She is alert and oriented to person, place, and time. She has normal reflexes. She displays normal reflexes. No cranial nerve deficit. She exhibits normal muscle tone. Coordination normal.  Skin: Skin is warm and dry. No rash noted. She is not diaphoretic. No erythema. No pallor.  Psychiatric: She has a normal mood and affect. Her behavior is normal. Judgment and thought content normal.     Lab Results  Component Value Date   WBC 6.2 08/15/2013   HGB 15.0 08/15/2013   HCT 43.1 08/15/2013   PLT 273.0 08/15/2013   GLUCOSE 123* 08/15/2013   CHOL 204* 08/15/2013   TRIG 170.0* 08/15/2013   HDL 62.30 08/15/2013   LDLDIRECT 119.8 08/15/2013   ALT 21 08/15/2013   AST 18 08/15/2013   NA 138 08/15/2013   K 3.7 08/15/2013   CL 103 08/15/2013   CREATININE 0.8 08/15/2013   BUN 13 08/15/2013   CO2 25 08/15/2013   TSH 2.21 08/15/2013   HGBA1C 6.2 08/15/2013       Assessment & Plan:

## 2013-12-27 NOTE — Patient Instructions (Signed)
Torticollis, Acute You have suddenly (acutely) developed a twisted neck (torticollis). This is usually a self-limited condition. CAUSES  Acute torticollis may be caused by malposition, trauma or infection. Most commonly, acute torticollis is caused by sleeping in an awkward position. Torticollis may also be caused by the flexion, extension or twisting of the neck muscles beyond their normal position. Sometimes, the exact cause may not be known. SYMPTOMS  Usually, there is pain and limited movement of the neck. Your neck may twist to one side. DIAGNOSIS  The diagnosis is often made by physical examination. X-rays, CT scans or MRIs may be done if there is a history of trauma or concern of infection. TREATMENT  For a common, stiff neck that develops during sleep, treatment is focused on relaxing the contracted neck muscle. Medications (including shots) may be used to treat the problem. Most cases resolve in several days. Torticollis usually responds to conservative physical therapy. If left untreated, the shortened and spastic neck muscle can cause deformities in the face and neck. Rarely, surgery is required. HOME CARE INSTRUCTIONS   Use over-the-counter and prescription medications as directed by your caregiver.  Do stretching exercises and massage the neck as directed by your caregiver.  Follow up with physical therapy if needed and as directed by your caregiver. SEEK IMMEDIATE MEDICAL CARE IF:   You develop difficulty breathing or noisy breathing (stridor).  You drool, develop trouble swallowing or have pain with swallowing.  You develop numbness or weakness in the hands or feet.  You have changes in speech or vision.  You have problems with urination or bowel movements.  You have difficulty walking.  You have a fever.  You have increased pain. MAKE SURE YOU:   Understand these instructions.  Will watch your condition.  Will get help right away if you are not doing well or  get worse. Document Released: 07/16/2000 Document Revised: 10/11/2011 Document Reviewed: 08/27/2009 [facility] Patient Information 2014 [place], [place]. Preventive Care for Adults, Female A healthy lifestyle and preventive care can promote health and wellness. Preventive health guidelines for women include the following key practices.  A routine yearly physical is a good way to check with your health care provider about your health and preventive screening. It is a chance to share any concerns and updates on your health and to receive a thorough exam.  Visit your dentist for a routine exam and preventive care every 6 months. Brush your teeth twice a day and floss once a day. Good oral hygiene prevents tooth decay and gum disease.  The frequency of eye exams is based on your age, health, family medical history, use of contact lenses, and other factors. Follow your health care provider's recommendations for frequency of eye exams.  Eat a healthy diet. Foods like vegetables, fruits, whole grains, low-fat dairy products, and lean protein foods contain the nutrients you need without too many calories. Decrease your intake of foods high in solid fats, added sugars, and salt. Eat the right amount of calories for you.Get information about a proper diet from your health care provider, if necessary.  Regular physical exercise is one of the most important things you can do for your health. Most adults should get at least 150 minutes of moderate-intensity exercise (any activity that increases your heart rate and causes you to sweat) each week. In addition, most adults need muscle-strengthening exercises on 2 or more days a week.  Maintain a healthy weight. The body mass index (BMI) is a screening tool to  identify possible weight problems. It provides an estimate of body fat based on height and weight. Your health care provider can find your BMI, and can help you achieve or maintain a healthy weight.For adults 20  years and older:  A BMI below 18.5 is considered underweight.  A BMI of 18.5 to 24.9 is normal.  A BMI of 25 to 29.9 is considered overweight.  A BMI of 30 and above is considered obese.  Maintain normal blood lipids and cholesterol levels by exercising and minimizing your intake of saturated fat. Eat a balanced diet with plenty of fruit and vegetables. Blood tests for lipids and cholesterol should begin at age 16 and be repeated every 5 years. If your lipid or cholesterol levels are high, you are over 50, or you are at high risk for heart disease, you may need your cholesterol levels checked more frequently.Ongoing high lipid and cholesterol levels should be treated with medicines if diet and exercise are not working.  If you smoke, find out from your health care provider how to quit. If you do not use tobacco, do not start.  Lung cancer screening is recommended for adults aged 49 80 years who are at high risk for developing lung cancer because of a history of smoking. A yearly low-dose CT scan of the lungs is recommended for people who have at least a 30-pack-year history of smoking and are a current smoker or have quit within the past 15 years. A pack year of smoking is smoking an average of 1 pack of cigarettes a day for 1 year (for example: 1 pack a day for 30 years or 2 packs a day for 15 years). Yearly screening should continue until the smoker has stopped smoking for at least 15 years. Yearly screening should be stopped for people who develop a health problem that would prevent them from having lung cancer treatment.  If you are pregnant, do not drink alcohol. If you are breastfeeding, be very cautious about drinking alcohol. If you are not pregnant and choose to drink alcohol, do not have more than 1 drink per day. One drink is considered to be 12 ounces (355 mL) of beer, 5 ounces (148 mL) of wine, or 1.5 ounces (44 mL) of liquor.  Avoid use of street drugs. Do not share needles with  anyone. Ask for help if you need support or instructions about stopping the use of drugs.  High blood pressure causes heart disease and increases the risk of stroke. Your blood pressure should be checked at least every 1 to 2 years. Ongoing high blood pressure should be treated with medicines if weight loss and exercise do not work.  If you are 58 67 years old, ask your health care provider if you should take aspirin to prevent strokes.  Diabetes screening involves taking a blood sample to check your fasting blood sugar level. This should be done once every 3 years, after age 36, if you are within normal weight and without risk factors for diabetes. Testing should be considered at a younger age or be carried out more frequently if you are overweight and have at least 1 risk factor for diabetes.  Breast cancer screening is essential preventive care for women. You should practice "breast self-awareness." This means understanding the normal appearance and feel of your breasts and may include breast self-examination. Any changes detected, no matter how small, should be reported to a health care provider. Women in their 64s and 30s should have a clinical  breast exam (CBE) by a health care provider as part of a regular health exam every 1 to 3 years. After age 45, women should have a CBE every year. Starting at age 61, women should consider having a mammogram (breast X-ray test) every year. Women who have a family history of breast cancer should talk to their health care provider about genetic screening. Women at a high risk of breast cancer should talk to their health care providers about having an MRI and a mammogram every year.  Breast cancer gene (BRCA)-related cancer risk assessment is recommended for women who have family members with BRCA-related cancers. BRCA-related cancers include breast, ovarian, tubal, and peritoneal cancers. Having family members with these cancers may be associated with an  increased risk for harmful changes (mutations) in the breast cancer genes BRCA1 and BRCA2. Results of the assessment will determine the need for genetic counseling and BRCA1 and BRCA2 testing.  The Pap test is a screening test for cervical cancer. A Pap test can show cell changes on the cervix that might become cervical cancer if left untreated. A Pap test is a procedure in which cells are obtained and examined from the lower end of the uterus (cervix).  Women should have a Pap test starting at age 84.  Between ages 9 and 37, Pap tests should be repeated every 2 years.  Beginning at age 21, you should have a Pap test every 3 years as long as the past 3 Pap tests have been normal.  Some women have medical problems that increase the chance of getting cervical cancer. Talk to your health care provider about these problems. It is especially important to talk to your health care provider if a new problem develops soon after your last Pap test. In these cases, your health care provider may recommend more frequent screening and Pap tests.  The above recommendations are the same for women who have or have not gotten the vaccine for human papillomavirus (HPV).  If you had a hysterectomy for a problem that was not cancer or a condition that could lead to cancer, then you no longer need Pap tests. Even if you no longer need a Pap test, a regular exam is a good idea to make sure no other problems are starting.  If you are between ages 68 and 29 years, and you have had normal Pap tests going back 10 years, you no longer need Pap tests. Even if you no longer need a Pap test, a regular exam is a good idea to make sure no other problems are starting.  If you have had past treatment for cervical cancer or a condition that could lead to cancer, you need Pap tests and screening for cancer for at least 20 years after your treatment.  If Pap tests have been discontinued, risk factors (such as a new sexual partner)  need to be reassessed to determine if screening should be resumed.  The HPV test is an additional test that may be used for cervical cancer screening. The HPV test looks for the virus that can cause the cell changes on the cervix. The cells collected during the Pap test can be tested for HPV. The HPV test could be used to screen women aged 44 years and older, and should be used in women of any age who have unclear Pap test results. After the age of 49, women should have HPV testing at the same frequency as a Pap test.  Colorectal cancer can be detected  and often prevented. Most routine colorectal cancer screening begins at the age of 62 years and continues through age 62 years. However, your health care provider may recommend screening at an earlier age if you have risk factors for colon cancer. On a yearly basis, your health care provider may provide home test kits to check for hidden blood in the stool. Use of a small camera at the end of a tube, to directly examine the colon (sigmoidoscopy or colonoscopy), can detect the earliest forms of colorectal cancer. Talk to your health care provider about this at age 72, when routine screening begins. Direct exam of the colon should be repeated every 5 10 years through age 89 years, unless early forms of pre-cancerous polyps or small growths are found.  People who are at an increased risk for hepatitis B should be screened for this virus. You are considered at high risk for hepatitis B if:  You were born in a country where hepatitis B occurs often. Talk with your health care provider about which countries are considered high risk.  Your parents were born in a high-risk country and you have not received a shot to protect against hepatitis B (hepatitis B vaccine).  You have HIV or AIDS.  You use needles to inject street drugs.  You live with, or have sex with, someone who has Hepatitis B.  You get hemodialysis treatment.  You take certain medicines for  conditions like cancer, organ transplantation, and autoimmune conditions.  Hepatitis C blood testing is recommended for all people born from [birth year] through 1964 and any individual with known risks for hepatitis C.  Practice safe sex. Use condoms and avoid high-risk sexual practices to reduce the spread of sexually transmitted infections (STIs). STIs include gonorrhea, chlamydia, syphilis, trichomonas, herpes, HPV, and human immunodeficiency virus (HIV). Herpes, HIV, and HPV are viral illnesses that have no cure. They can result in disability, cancer, and death. Sexually active women aged 11 years and younger should be checked for chlamydia. Older women with new or multiple partners should also be tested for chlamydia. Testing for other STIs is recommended if you are sexually active and at increased risk.  Osteoporosis is a disease in which the bones lose minerals and strength with aging. This can result in serious bone fractures or breaks. The risk of osteoporosis can be identified using a bone density scan. Women ages 96 years and over and women at risk for fractures or osteoporosis should discuss screening with their health care providers. Ask your health care provider whether you should take a calcium supplement or vitamin D to reduce the rate of osteoporosis.  Menopause can be associated with physical symptoms and risks. Hormone replacement therapy is available to decrease symptoms and risks. You should talk to your health care provider about whether hormone replacement therapy is right for you.  Use sunscreen. Apply sunscreen liberally and repeatedly throughout the day. You should seek shade when your shadow is shorter than you. Protect yourself by wearing long sleeves, pants, a wide-brimmed hat, and sunglasses year round, whenever you are outdoors.  Once a month, do a whole body skin exam, using a mirror to look at the skin on your back. Tell your health care provider of new moles, moles that have  irregular borders, moles that are larger than a pencil eraser, or moles that have changed in shape or color.  Stay current with required vaccines (immunizations).  Influenza vaccine. All adults should be immunized every year.  Tetanus, diphtheria, and  acellular pertussis (Td, Tdap) vaccine. Pregnant women should receive 1 dose of Tdap vaccine during each pregnancy. The dose should be obtained regardless of the length of time since the last dose. Immunization is preferred during the 27th 36th week of gestation. An adult who has not previously received Tdap or who does not know her vaccine status should receive 1 dose of Tdap. This initial dose should be followed by tetanus and diphtheria toxoids (Td) booster doses every 10 years. Adults with an unknown or incomplete history of completing a 3-dose immunization series with Td-containing vaccines should begin or complete a primary immunization series including a Tdap dose. Adults should receive a Td booster every 10 years.  Varicella vaccine. An adult without evidence of immunity to varicella should receive 2 doses or a second dose if she has previously received 1 dose. Pregnant females who do not have evidence of immunity should receive the first dose after pregnancy. This first dose should be obtained before leaving the health care facility. The second dose should be obtained 4 8 weeks after the first dose.  Human papillomavirus (HPV) vaccine. Females aged 63 26 years who have not received the vaccine previously should obtain the 3-dose series. The vaccine is not recommended for use in pregnant females. However, pregnancy testing is not needed before receiving a dose. If a female is found to be pregnant after receiving a dose, no treatment is needed. In that case, the remaining doses should be delayed until after the pregnancy. Immunization is recommended for any person with an immunocompromised condition through the age of 31 years if she did not get any or  all doses earlier. During the 3-dose series, the second dose should be obtained 4 8 weeks after the first dose. The third dose should be obtained 24 weeks after the first dose and 16 weeks after the second dose.  Zoster vaccine. One dose is recommended for adults aged 74 years or older unless certain conditions are present.  Measles, mumps, and rubella (MMR) vaccine. Adults born before [birth year] generally are considered immune to measles and mumps. Adults born in [birth year] or later should have 1 or more doses of MMR vaccine unless there is a contraindication to the vaccine or there is laboratory evidence of immunity to each of the three diseases. A routine second dose of MMR vaccine should be obtained at least 28 days after the first dose for students attending postsecondary schools, health care workers, or international travelers. People who received inactivated measles vaccine or an unknown type of measles vaccine during 1963 1967 should receive 2 doses of MMR vaccine. People who received inactivated mumps vaccine or an unknown type of mumps vaccine before 1979 and are at high risk for mumps infection should consider immunization with 2 doses of MMR vaccine. For females of childbearing age, rubella immunity should be determined. If there is no evidence of immunity, females who are not pregnant should be vaccinated. If there is no evidence of immunity, females who are pregnant should delay immunization until after pregnancy. Unvaccinated health care workers born before [birth year] who lack laboratory evidence of measles, mumps, or rubella immunity or laboratory confirmation of disease should consider measles and mumps immunization with 2 doses of MMR vaccine or rubella immunization with 1 dose of MMR vaccine.  Pneumococcal 13-valent conjugate (PCV13) vaccine. When indicated, a person who is uncertain of her immunization history and has no record of immunization should receive the PCV13 vaccine. An adult aged 82 years or older  who has  certain medical conditions and has not been previously immunized should receive 1 dose of PCV13 vaccine. This PCV13 should be followed with a dose of pneumococcal polysaccharide (PPSV23) vaccine. The PPSV23 vaccine dose should be obtained at least 8 weeks after the dose of PCV13 vaccine. An adult aged 28 years or older who has certain medical conditions and previously received 1 or more doses of PPSV23 vaccine should receive 1 dose of PCV13. The PCV13 vaccine dose should be obtained 1 or more years after the last PPSV23 vaccine dose.  Pneumococcal polysaccharide (PPSV23) vaccine. When PCV13 is also indicated, PCV13 should be obtained first. All adults aged 5 years and older should be immunized. An adult younger than age 7 years who has certain medical conditions should be immunized. Any person who resides in a nursing home or long-term care facility should be immunized. An adult smoker should be immunized. People with an immunocompromised condition and certain other conditions should receive both PCV13 and PPSV23 vaccines. People with human immunodeficiency virus (HIV) infection should be immunized as soon as possible after diagnosis. Immunization during chemotherapy or radiation therapy should be avoided. Routine use of PPSV23 vaccine is not recommended for American Indians, [place] Natives, or people younger than 65 years unless there are medical conditions that require PPSV23 vaccine. When indicated, people who have unknown immunization and have no record of immunization should receive PPSV23 vaccine. One-time revaccination 5 years after the first dose of PPSV23 is recommended for people aged 70 64 years who have chronic kidney failure, nephrotic syndrome, asplenia, or immunocompromised conditions. People who received 1 2 doses of PPSV23 before age 73 years should receive another dose of PPSV23 vaccine at age 73 years or later if at least 5 years have passed since the previous dose. Doses of PPSV23 are  not needed for people immunized with PPSV23 at or after age 29 years.  Meningococcal vaccine. Adults with asplenia or persistent complement component deficiencies should receive 2 doses of quadrivalent meningococcal conjugate (MenACWY-D) vaccine. The doses should be obtained at least 2 months apart. Microbiologists working with certain meningococcal bacteria, [place] recruits, people at risk during an outbreak, and people who travel to or live in countries with a high rate of meningitis should be immunized. A first-year college student up through age 94 years who is living in a residence hall should receive a dose if she did not receive a dose on or after her 16th birthday. Adults who have certain high-risk conditions should receive one or more doses of vaccine.  Hepatitis A vaccine. Adults who wish to be protected from this disease, have certain high-risk conditions, work with hepatitis A-infected animals, work in hepatitis A research labs, or travel to or work in countries with a high rate of hepatitis A should be immunized. Adults who were previously unvaccinated and who anticipate close contact with an international adoptee during the first 60 days after arrival in the [place] from a country with a high rate of hepatitis A should be immunized.  Hepatitis B vaccine. Adults who wish to be protected from this disease, have certain high-risk conditions, may be exposed to blood or other infectious body fluids, are household contacts or sex partners of hepatitis B positive people, are clients or workers in certain care facilities, or travel to or work in countries with a high rate of hepatitis B should be immunized.  Haemophilus influenzae type b (Hib) vaccine. A previously unvaccinated person with asplenia or sickle cell disease or having a scheduled splenectomy should  receive 1 dose of Hib vaccine. Regardless of previous immunization, a recipient of a hematopoietic stem cell transplant should receive  a 3-dose series 6 12 months after her successful transplant. Hib vaccine is not recommended for adults with HIV infection. Preventive Services / Frequency Ages 48 to 39years  Blood pressure check.** / Every 1 to 2 years.  Lipid and cholesterol check.** / Every 5 years beginning at age 60.  Clinical breast exam.** / Every 3 years for women in their 61s and 1s.  BRCA-related cancer risk assessment.** / For women who have family members with a BRCA-related cancer (breast, ovarian, tubal, or peritoneal cancers).  Pap test.** / Every 2 years from ages 54 through 40. Every 3 years starting at age 65 through age 23 or 77 with a history of 3 consecutive normal Pap tests.  HPV screening.** / Every 3 years from ages 67 through ages 23 to 76 with a history of 3 consecutive normal Pap tests.  Hepatitis C blood test.** / For any individual with known risks for hepatitis C.  Skin self-exam. / Monthly.  Influenza vaccine. / Every year.  Tetanus, diphtheria, and acellular pertussis (Tdap, Td) vaccine.** / Consult your health care provider. Pregnant women should receive 1 dose of Tdap vaccine during each pregnancy. 1 dose of Td every 10 years.  Varicella vaccine.** / Consult your health care provider. Pregnant females who do not have evidence of immunity should receive the first dose after pregnancy.  HPV vaccine. / 3 doses over 6 months, if 84 and younger. The vaccine is not recommended for use in pregnant females. However, pregnancy testing is not needed before receiving a dose.  Measles, mumps, rubella (MMR) vaccine.** / You need at least 1 dose of MMR if you were born in 1957 or later. You may also need a 2nd dose. For females of childbearing age, rubella immunity should be determined. If there is no evidence of immunity, females who are not pregnant should be vaccinated. If there is no evidence of immunity, females who are pregnant should delay immunization until after pregnancy.  Pneumococcal  13-valent conjugate (PCV13) vaccine.** / Consult your health care provider.  Pneumococcal polysaccharide (PPSV23) vaccine.** / 1 to 2 doses if you smoke cigarettes or if you have certain conditions.  Meningococcal vaccine.** / 1 dose if you are age 49 to 58 years and a Market researcher living in a residence hall, or have one of several medical conditions, you need to get vaccinated against meningococcal disease. You may also need additional booster doses.  Hepatitis A vaccine.** / Consult your health care provider.  Hepatitis B vaccine.** / Consult your health care provider.  Haemophilus influenzae type b (Hib) vaccine.** / Consult your health care provider. Ages 23 to 64years  Blood pressure check.** / Every 1 to 2 years.  Lipid and cholesterol check.** / Every 5 years beginning at age 45 years.  Lung cancer screening. / Every year if you are aged 23 80 years and have a 30-pack-year history of smoking and currently smoke or have quit within the past 15 years. Yearly screening is stopped once you have quit smoking for at least 15 years or develop a health problem that would prevent you from having lung cancer treatment.  Clinical breast exam.** / Every year after age 22 years.  BRCA-related cancer risk assessment.** / For women who have family members with a BRCA-related cancer (breast, ovarian, tubal, or peritoneal cancers).  Mammogram.** / Every year beginning at age 74 years and  continuing for as long as you are in good health. Consult with your health care provider.  Pap test.** / Every 3 years starting at age 70 years through age 75 or 69 years with a history of 3 consecutive normal Pap tests.  HPV screening.** / Every 3 years from ages 7 years through ages 25 to 25 years with a history of 3 consecutive normal Pap tests.  Fecal occult blood test (FOBT) of stool. / Every year beginning at age 54 years and continuing until age 12 years. You may not need to do this test if  you get a colonoscopy every 10 years.  Flexible sigmoidoscopy or colonoscopy.** / Every 5 years for a flexible sigmoidoscopy or every 10 years for a colonoscopy beginning at age 15 years and continuing until age 73 years.  Hepatitis C blood test.** / For all people born from [birth year] through 1964 and any individual with known risks for hepatitis C.  Skin self-exam. / Monthly.  Influenza vaccine. / Every year.  Tetanus, diphtheria, and acellular pertussis (Tdap/Td) vaccine.** / Consult your health care provider. Pregnant women should receive 1 dose of Tdap vaccine during each pregnancy. 1 dose of Td every 10 years.  Varicella vaccine.** / Consult your health care provider. Pregnant females who do not have evidence of immunity should receive the first dose after pregnancy.  Zoster vaccine.** / 1 dose for adults aged 84 years or older.  Measles, mumps, rubella (MMR) vaccine.** / You need at least 1 dose of MMR if you were born in 1957 or later. You may also need a 2nd dose. For females of childbearing age, rubella immunity should be determined. If there is no evidence of immunity, females who are not pregnant should be vaccinated. If there is no evidence of immunity, females who are pregnant should delay immunization until after pregnancy.  Pneumococcal 13-valent conjugate (PCV13) vaccine.** / Consult your health care provider.  Pneumococcal polysaccharide (PPSV23) vaccine.** / 1 to 2 doses if you smoke cigarettes or if you have certain conditions.  Meningococcal vaccine.** / Consult your health care provider.  Hepatitis A vaccine.** / Consult your health care provider.  Hepatitis B vaccine.** / Consult your health care provider.  Haemophilus influenzae type b (Hib) vaccine.** / Consult your health care provider. Ages 34 years and over  Blood pressure check.** / Every 1 to 2 years.  Lipid and cholesterol check.** / Every 5 years beginning at age 78 years.  Lung cancer screening. / Every  year if you are aged 37 80 years and have a 30-pack-year history of smoking and currently smoke or have quit within the past 15 years. Yearly screening is stopped once you have quit smoking for at least 15 years or develop a health problem that would prevent you from having lung cancer treatment.  Clinical breast exam.** / Every year after age 52 years.  BRCA-related cancer risk assessment.** / For women who have family members with a BRCA-related cancer (breast, ovarian, tubal, or peritoneal cancers).  Mammogram.** / Every year beginning at age 39 years and continuing for as long as you are in good health. Consult with your health care provider.  Pap test.** / Every 3 years starting at age 4 years through age 49 or 83 years with 3 consecutive normal Pap tests. Testing can be stopped between 65 and 70 years with 3 consecutive normal Pap tests and no abnormal Pap or HPV tests in the past 10 years.  HPV screening.** / Every 3 years from ages  51 years through ages 62 or 41 years with a history of 3 consecutive normal Pap tests. Testing can be stopped between 65 and 70 years with 3 consecutive normal Pap tests and no abnormal Pap or HPV tests in the past 10 years.  Fecal occult blood test (FOBT) of stool. / Every year beginning at age 21 years and continuing until age 62 years. You may not need to do this test if you get a colonoscopy every 10 years.  Flexible sigmoidoscopy or colonoscopy.** / Every 5 years for a flexible sigmoidoscopy or every 10 years for a colonoscopy beginning at age 35 years and continuing until age 74 years.  Hepatitis C blood test.** / For all people born from [birth year] through 1965 and any individual with known risks for hepatitis C.  Osteoporosis screening.** / A one-time screening for women ages 25 years and over and women at risk for fractures or osteoporosis.  Skin self-exam. / Monthly.  Influenza vaccine. / Every year.  Tetanus, diphtheria, and acellular pertussis  (Tdap/Td) vaccine.** / 1 dose of Td every 10 years.  Varicella vaccine.** / Consult your health care provider.  Zoster vaccine.** / 1 dose for adults aged 26 years or older.  Pneumococcal 13-valent conjugate (PCV13) vaccine.** / Consult your health care provider.  Pneumococcal polysaccharide (PPSV23) vaccine.** / 1 dose for all adults aged 33 years and older.  Meningococcal vaccine.** / Consult your health care provider.  Hepatitis A vaccine.** / Consult your health care provider.  Hepatitis B vaccine.** / Consult your health care provider.  Haemophilus influenzae type b (Hib) vaccine.** / Consult your health care provider. ** Family history and personal history of risk and conditions may change your health care provider's recommendations. Document Released: 09/14/2001 Document Revised: 05/09/2013 Document Reviewed: 12/14/2010 [facility] Patient Information 2014 [place], [place].

## 2013-12-27 NOTE — Assessment & Plan Note (Signed)
She has pre-diabetes I will cont to monitor her A1C

## 2013-12-28 ENCOUNTER — Telehealth: Payer: Self-pay | Admitting: Internal Medicine

## 2013-12-28 ENCOUNTER — Encounter: Payer: Self-pay | Admitting: Internal Medicine

## 2013-12-28 DIAGNOSIS — E559 Vitamin D deficiency, unspecified: Secondary | ICD-10-CM | POA: Insufficient documentation

## 2013-12-28 LAB — VITAMIN D 25 HYDROXY (VIT D DEFICIENCY, FRACTURES): Vit D, 25-Hydroxy: 12 ng/mL — ABNORMAL LOW (ref 30–89)

## 2013-12-28 MED ORDER — CHOLECALCIFEROL 1.25 MG (50000 UT) PO TABS: 1.0000 | ORAL_TABLET | ORAL | Status: DC

## 2013-12-28 NOTE — Addendum Note (Signed)
Addended by: Janith Lima on: 12/28/2013 11:34 AM   Modules accepted: Orders

## 2013-12-28 NOTE — Telephone Encounter (Signed)
Relevant patient education mailed to patient.  

## 2014-02-04 ENCOUNTER — Other Ambulatory Visit: Payer: Self-pay | Admitting: Internal Medicine

## 2014-02-04 DIAGNOSIS — Z1231 Encounter for screening mammogram for malignant neoplasm of breast: Secondary | ICD-10-CM

## 2014-02-07 ENCOUNTER — Other Ambulatory Visit: Payer: Self-pay | Admitting: Neurosurgery

## 2014-02-07 DIAGNOSIS — M47812 Spondylosis without myelopathy or radiculopathy, cervical region: Secondary | ICD-10-CM

## 2014-02-10 ENCOUNTER — Ambulatory Visit
Admission: RE | Admit: 2014-02-10 | Discharge: 2014-02-10 | Disposition: A | Discharge status: Home / Self Care | Payer: PRIVATE HEALTH INSURANCE | Source: Ambulatory Visit | Attending: Neurosurgery | Admitting: Neurosurgery

## 2014-02-10 DIAGNOSIS — M47812 Spondylosis without myelopathy or radiculopathy, cervical region: Secondary | ICD-10-CM

## 2014-02-13 ENCOUNTER — Other Ambulatory Visit: Payer: Self-pay

## 2014-02-13 ENCOUNTER — Emergency Department (HOSPITAL_COMMUNITY): Payer: 59

## 2014-02-13 ENCOUNTER — Observation Stay (HOSPITAL_COMMUNITY): Payer: 59

## 2014-02-13 ENCOUNTER — Inpatient Hospital Stay (HOSPITAL_COMMUNITY)
Admission: EM | Admit: 2014-02-13 | Discharge: 2014-02-15 | DRG: 092 | Disposition: A | Discharge status: Home / Self Care | Payer: 59 | Attending: Internal Medicine | Admitting: Internal Medicine

## 2014-02-13 ENCOUNTER — Encounter (HOSPITAL_COMMUNITY): Payer: Self-pay | Admitting: Emergency Medicine

## 2014-02-13 DIAGNOSIS — Y831 Surgical operation with implant of artificial internal device as the cause of abnormal reaction of the patient, or of later complication, without mention of misadventure at the time of the procedure: Secondary | ICD-10-CM | POA: Diagnosis present

## 2014-02-13 DIAGNOSIS — G910 Communicating hydrocephalus: Secondary | ICD-10-CM | POA: Diagnosis present

## 2014-02-13 DIAGNOSIS — E538 Deficiency of other specified B group vitamins: Secondary | ICD-10-CM

## 2014-02-13 DIAGNOSIS — T85695A Other mechanical complication of other nervous system device, implant or graft, initial encounter: Principal | ICD-10-CM | POA: Diagnosis present

## 2014-02-13 DIAGNOSIS — I10 Essential (primary) hypertension: Secondary | ICD-10-CM | POA: Diagnosis present

## 2014-02-13 DIAGNOSIS — R55 Syncope and collapse: Secondary | ICD-10-CM | POA: Diagnosis present

## 2014-02-13 DIAGNOSIS — Z1231 Encounter for screening mammogram for malignant neoplasm of breast: Secondary | ICD-10-CM

## 2014-02-13 DIAGNOSIS — F329 Major depressive disorder, single episode, unspecified: Secondary | ICD-10-CM | POA: Diagnosis present

## 2014-02-13 DIAGNOSIS — R740 Nonspecific elevation of levels of transaminase and lactic acid dehydrogenase [LDH]: Secondary | ICD-10-CM

## 2014-02-13 DIAGNOSIS — N3281 Overactive bladder: Secondary | ICD-10-CM

## 2014-02-13 DIAGNOSIS — M47812 Spondylosis without myelopathy or radiculopathy, cervical region: Secondary | ICD-10-CM | POA: Diagnosis present

## 2014-02-13 DIAGNOSIS — I951 Orthostatic hypotension: Secondary | ICD-10-CM

## 2014-02-13 DIAGNOSIS — W19XXXA Unspecified fall, initial encounter: Secondary | ICD-10-CM

## 2014-02-13 DIAGNOSIS — F3289 Other specified depressive episodes: Secondary | ICD-10-CM | POA: Diagnosis present

## 2014-02-13 DIAGNOSIS — R32 Unspecified urinary incontinence: Secondary | ICD-10-CM | POA: Diagnosis present

## 2014-02-13 DIAGNOSIS — M542 Cervicalgia: Secondary | ICD-10-CM

## 2014-02-13 DIAGNOSIS — I959 Hypotension, unspecified: Secondary | ICD-10-CM | POA: Diagnosis present

## 2014-02-13 DIAGNOSIS — W108XXA Fall (on) (from) other stairs and steps, initial encounter: Secondary | ICD-10-CM | POA: Diagnosis present

## 2014-02-13 DIAGNOSIS — Z0000 Encounter for general adult medical examination without abnormal findings: Secondary | ICD-10-CM

## 2014-02-13 DIAGNOSIS — E119 Type 2 diabetes mellitus without complications: Secondary | ICD-10-CM | POA: Diagnosis present

## 2014-02-13 DIAGNOSIS — R7401 Elevation of levels of liver transaminase levels: Secondary | ICD-10-CM

## 2014-02-13 DIAGNOSIS — M8580 Other specified disorders of bone density and structure, unspecified site: Secondary | ICD-10-CM

## 2014-02-13 DIAGNOSIS — K219 Gastro-esophageal reflux disease without esophagitis: Secondary | ICD-10-CM | POA: Diagnosis present

## 2014-02-13 DIAGNOSIS — R7309 Other abnormal glucose: Secondary | ICD-10-CM

## 2014-02-13 DIAGNOSIS — I059 Rheumatic mitral valve disease, unspecified: Secondary | ICD-10-CM | POA: Diagnosis present

## 2014-02-13 DIAGNOSIS — N289 Disorder of kidney and ureter, unspecified: Secondary | ICD-10-CM

## 2014-02-13 DIAGNOSIS — K7689 Other specified diseases of liver: Secondary | ICD-10-CM | POA: Diagnosis present

## 2014-02-13 DIAGNOSIS — N179 Acute kidney failure, unspecified: Secondary | ICD-10-CM | POA: Diagnosis present

## 2014-02-13 DIAGNOSIS — F411 Generalized anxiety disorder: Secondary | ICD-10-CM | POA: Diagnosis present

## 2014-02-13 DIAGNOSIS — Z823 Family history of stroke: Secondary | ICD-10-CM

## 2014-02-13 DIAGNOSIS — E559 Vitamin D deficiency, unspecified: Secondary | ICD-10-CM

## 2014-02-13 DIAGNOSIS — N318 Other neuromuscular dysfunction of bladder: Secondary | ICD-10-CM

## 2014-02-13 DIAGNOSIS — R7402 Elevation of levels of lactic acid dehydrogenase (LDH): Secondary | ICD-10-CM

## 2014-02-13 DIAGNOSIS — Z9049 Acquired absence of other specified parts of digestive tract: Secondary | ICD-10-CM

## 2014-02-13 DIAGNOSIS — R112 Nausea with vomiting, unspecified: Secondary | ICD-10-CM | POA: Diagnosis present

## 2014-02-13 DIAGNOSIS — E860 Dehydration: Secondary | ICD-10-CM | POA: Diagnosis present

## 2014-02-13 HISTORY — DX: Hydrocephalus, unspecified: G91.9

## 2014-02-13 HISTORY — DX: Nonrheumatic mitral (valve) prolapse: I34.1

## 2014-02-13 LAB — CBC
HCT: 40.5 % (ref 36.0–46.0)
Hemoglobin: 13.3 g/dL (ref 12.0–15.0)
MCH: 31 pg (ref 26.0–34.0)
MCHC: 32.8 g/dL (ref 30.0–36.0)
MCV: 94.4 fL (ref 78.0–100.0)
Platelets: 251 10*3/uL (ref 150–400)
RBC: 4.29 MIL/uL (ref 3.87–5.11)
RDW: 14 % (ref 11.5–15.5)
WBC: 7.8 10*3/uL (ref 4.0–10.5)

## 2014-02-13 LAB — HEMOGLOBIN A1C
Hgb A1c MFr Bld: 6.6 % — ABNORMAL HIGH (ref <5.7)
Mean Plasma Glucose: 143 mg/dL — ABNORMAL HIGH (ref <117)

## 2014-02-13 LAB — URINALYSIS, ROUTINE W REFLEX MICROSCOPIC
Glucose, UA: NEGATIVE mg/dL
Hgb urine dipstick: NEGATIVE
Ketones, ur: 15 mg/dL — AB
Nitrite: NEGATIVE
Protein, ur: NEGATIVE mg/dL
Specific Gravity, Urine: 1.021 (ref 1.005–1.030)
Urobilinogen, UA: 1 mg/dL (ref 0.0–1.0)
pH: 5 (ref 5.0–8.0)

## 2014-02-13 LAB — URINE MICROSCOPIC-ADD ON

## 2014-02-13 LAB — GLUCOSE, CAPILLARY
Glucose-Capillary: 236 mg/dL — ABNORMAL HIGH (ref 70–99)
Glucose-Capillary: 195 mg/dL — ABNORMAL HIGH (ref 70–99)
Glucose-Capillary: 129 mg/dL — ABNORMAL HIGH (ref 70–99)
Glucose-Capillary: 186 mg/dL — ABNORMAL HIGH (ref 70–99)

## 2014-02-13 LAB — COMPREHENSIVE METABOLIC PANEL
ALT: 216 U/L — ABNORMAL HIGH (ref 0–35)
AST: 166 U/L — ABNORMAL HIGH (ref 0–37)
Albumin: 3.4 g/dL — ABNORMAL LOW (ref 3.5–5.2)
Alkaline Phosphatase: 379 U/L — ABNORMAL HIGH (ref 39–117)
Anion gap: 21 — ABNORMAL HIGH (ref 5–15)
BUN: 18 mg/dL (ref 6–23)
CO2: 21 mEq/L (ref 19–32)
Calcium: 9.7 mg/dL (ref 8.4–10.5)
Chloride: 97 mEq/L (ref 96–112)
Creatinine, Ser: 1.63 mg/dL — ABNORMAL HIGH (ref 0.50–1.10)
GFR calc Af Amer: 37 mL/min — ABNORMAL LOW (ref >90)
GFR calc non Af Amer: 32 mL/min — ABNORMAL LOW (ref >90)
Glucose, Bld: 256 mg/dL — ABNORMAL HIGH (ref 70–99)
Potassium: 4.6 mEq/L (ref 3.7–5.3)
Sodium: 139 mEq/L (ref 137–147)
Total Bilirubin: 1.4 mg/dL — ABNORMAL HIGH (ref 0.3–1.2)
Total Protein: 7.6 g/dL (ref 6.0–8.3)

## 2014-02-13 LAB — AMMONIA: Ammonia: 28 umol/L (ref 11–60)

## 2014-02-13 LAB — TROPONIN I: Troponin I: <0.3 ng/mL (ref <0.30)

## 2014-02-13 LAB — CBG MONITORING, ED: Glucose-Capillary: 243 mg/dL — ABNORMAL HIGH (ref 70–99)

## 2014-02-13 MED ORDER — ONDANSETRON HCL 4 MG/2ML IJ SOLN
4.0000 mg | Freq: Four times a day (QID) | INTRAMUSCULAR | Status: DC | PRN
  Administered 2014-02-13 – 2014-02-14 (×2): 4 mg via INTRAVENOUS
  Filled 2014-02-13 (×2): qty 2

## 2014-02-13 MED ORDER — DEXTROSE 5 % IV SOLN
1.0000 g | INTRAVENOUS | Status: DC
  Administered 2014-02-13 – 2014-02-14 (×2): 1 g via INTRAVENOUS
  Filled 2014-02-13 (×3): qty 10

## 2014-02-13 MED ORDER — SODIUM CHLORIDE 0.9 % IV BOLUS (SEPSIS)
1000.0000 mL | Freq: Once | INTRAVENOUS | Status: AC
  Administered 2014-02-13: 1000 mL via INTRAVENOUS

## 2014-02-13 MED ORDER — VORTIOXETINE HBR 10 MG PO TABS
10.0000 mg | ORAL_TABLET | Freq: Every day | ORAL | Status: DC
  Administered 2014-02-13 – 2014-02-15 (×3): 10 mg via ORAL
  Filled 2014-02-13 (×3): qty 1

## 2014-02-13 MED ORDER — LORAZEPAM 1 MG PO TABS
1.0000 mg | ORAL_TABLET | Freq: Four times a day (QID) | ORAL | Status: DC | PRN
  Administered 2014-02-13 – 2014-02-15 (×5): 1 mg via ORAL
  Filled 2014-02-13 (×6): qty 1

## 2014-02-13 MED ORDER — ONDANSETRON HCL 4 MG PO TABS
4.0000 mg | ORAL_TABLET | Freq: Four times a day (QID) | ORAL | Status: DC | PRN
Start: 2014-02-13 — End: 2014-02-15
  Administered 2014-02-13: 4 mg via ORAL
  Filled 2014-02-13: qty 1

## 2014-02-13 MED ORDER — TIZANIDINE HCL 4 MG PO TABS
4.0000 mg | ORAL_TABLET | Freq: Two times a day (BID) | ORAL | Status: DC | PRN
  Filled 2014-02-13: qty 1

## 2014-02-13 MED ORDER — TRAMADOL HCL 50 MG PO TABS
50.0000 mg | ORAL_TABLET | Freq: Four times a day (QID) | ORAL | Status: DC | PRN
  Administered 2014-02-13 – 2014-02-14 (×5): 50 mg via ORAL
  Filled 2014-02-13 (×5): qty 1

## 2014-02-13 MED ORDER — INSULIN ASPART 100 UNIT/ML ~~LOC~~ SOLN: 0.0000 [IU] | Freq: Every day | SUBCUTANEOUS | Status: DC

## 2014-02-13 MED ORDER — HEPARIN SODIUM (PORCINE) 5000 UNIT/ML IJ SOLN
5000.0000 [IU] | Freq: Three times a day (TID) | INTRAMUSCULAR | Status: DC
  Administered 2014-02-13 – 2014-02-15 (×3): 5000 [IU] via SUBCUTANEOUS
  Filled 2014-02-13 (×7): qty 1

## 2014-02-13 MED ORDER — ONDANSETRON HCL 4 MG/2ML IJ SOLN: 4.0000 mg | Freq: Three times a day (TID) | INTRAMUSCULAR | Status: DC | PRN

## 2014-02-13 MED ORDER — SODIUM CHLORIDE 0.9 % IV SOLN
INTRAVENOUS | Status: DC
  Administered 2014-02-13 – 2014-02-14 (×2): via INTRAVENOUS

## 2014-02-13 MED ORDER — PANTOPRAZOLE SODIUM 40 MG PO TBEC
40.0000 mg | DELAYED_RELEASE_TABLET | Freq: Every day | ORAL | Status: DC
  Administered 2014-02-13 – 2014-02-15 (×3): 40 mg via ORAL
  Filled 2014-02-13 (×3): qty 1

## 2014-02-13 MED ORDER — SODIUM CHLORIDE 0.9 % IJ SOLN
3.0000 mL | Freq: Two times a day (BID) | INTRAMUSCULAR | Status: DC
  Administered 2014-02-13 – 2014-02-14 (×3): 3 mL via INTRAVENOUS

## 2014-02-13 MED ORDER — OLANZAPINE-FLUOXETINE HCL 6-25 MG PO CAPS
1.0000 | ORAL_CAPSULE | Freq: Every day | ORAL | Status: DC
  Administered 2014-02-13 – 2014-02-14 (×2): 1 via ORAL
  Filled 2014-02-13 (×4): qty 1

## 2014-02-13 MED ORDER — INSULIN ASPART 100 UNIT/ML ~~LOC~~ SOLN
0.0000 [IU] | Freq: Three times a day (TID) | SUBCUTANEOUS | Status: DC
  Administered 2014-02-13: 2 [IU] via SUBCUTANEOUS
  Administered 2014-02-13: 3 [IU] via SUBCUTANEOUS
  Administered 2014-02-13: 5 [IU] via SUBCUTANEOUS
  Administered 2014-02-14: 3 [IU] via SUBCUTANEOUS
  Administered 2014-02-15 (×2): 2 [IU] via SUBCUTANEOUS

## 2014-02-13 NOTE — ED Notes (Signed)
Cyanotic upon arrival, place on 2L and has since improved but skin remains cool and pale.

## 2014-02-13 NOTE — Progress Notes (Signed)
Physical Treatment Note  Eval complete with full details to follow;   Orthostatics as follows:   02/13/14 1136 02/13/14 1145  Vital Signs  Pulse Rate --  91  Pulse Rate Source --  Dinamap  BP --  108/70 mmHg  BP Method --  Automatic  Patient Position (if appropriate) --  Standing (post amb to/from bathroom)  Orthostatic Lying   BP- Lying 128/77 mmHg --   Pulse- Lying 76 --   Orthostatic Sitting  BP- Sitting 108/72 mmHg --   Pulse- Sitting 84 --   Orthostatic Standing at 0 minutes  BP- Standing at 0 minutes 116/78 mmHg --   Pulse- Standing at 0 minutes 84 --    Roney Marion, [place] Pager [phone] Office [phone]

## 2014-02-13 NOTE — H&P (Signed)
[facility] History and Physical  Patient: [name]  RKY:[id number]  DOB: 11-Aug-1945  DOS: the patient was seen and examined on 02/13/2014 PCP: [name], MD  Chief Complaint: Dizziness  HPI: [name] is a 68 y.o. female with Past medical history of hypertension, depression, anxiety, borderline diabetes, hydrocephalus, right occipital VP shunt . Patient presented with fall. She mentions that she has been having complaints of intractable nausea and vomiting since last 3 weeks. She mentions her symptoms or not present on a daily basis and would happen 2-3 times a week. If she complains of chronic acid reflux. She was recently started on Vesicare. Other than this no other recent change in her history. Today but she was walking the stairs she felt some dizziness and lightheadedness, and she lost her balance and fell down 3-4 steps. She was feeling weak and was unable to lift herself on her own for a while and was lying on the ground. Then when she finally was able to standup she called 911. She denied any palpitation, chest pain, shortness of breath, diarrhea, vomiting today. She mentions she has chronic acid reflux and epigastric pain the pain is associated with food, and resolves only with vomiting.  The patient is coming from home. And at her baseline independent for most of her ADL.  Review of Systems: as mentioned in the history of present illness.  A Comprehensive review of the other systems is negative.  Past Medical History  Diagnosis Date  . Depression   . Anxiety   . Hypertension   . Diabetes mellitus without complication   . MVP (mitral valve prolapse)   . Hydrocephalus    Past Surgical History  Procedure Laterality Date  . Cholecystectomy    . Brain surgery     Social History:  reports that she has never smoked. She has never used smokeless tobacco. She reports that she does not drink alcohol or use illicit drugs.  Allergies  Allergen Reactions   . Metformin And Related     diarrhea  . Amitriptyline Hcl Other (See Comments)    unknown  . Erythromycin Hives  . Penicillins Hives    Family History  Problem Relation Age of Onset  . Hypertension Mother   . Heart disease Mother   . Stroke Mother   . Alcohol abuse Father   . Diabetes Father   . Cancer Neg Hx     Prior to Admission medications   Medication Sig Start Date End Date Taking? Authorizing Provider  Cholecalciferol 50000 UNITS TABS Take 1 tablet by mouth once a week. 12/28/13  Yes Janith Lima, MD  gabapentin (NEURONTIN) 300 MG capsule Take 300 mg by mouth 2 (two) times daily as needed (pain).   Yes Historical Provider, MD  HYDROcodone-acetaminophen (NORCO/VICODIN) 5-325 MG per tablet Take 1-2 tablets by mouth every 6 (six) hours as needed for moderate pain. 12/27/13  Yes Janith Lima, MD  ibuprofen (ADVIL,MOTRIN) 200 MG tablet Take 400 mg by mouth every 6 (six) hours as needed for headache.   Yes Historical Provider, MD  LORazepam (ATIVAN) 1 MG tablet Take 1 mg by mouth every 6 (six) hours as needed for anxiety. Take up to 4 times qd   Yes Historical Provider, MD  losartan (COZAAR) 100 MG tablet Take 100 mg by mouth daily.   Yes Historical Provider, MD  metFORMIN (GLUCOPHAGE) 500 MG tablet Take 500 mg by mouth daily with breakfast.   Yes Historical Provider, MD  OLANZapine-FLUoxetine (  SYMBYAX) 6-25 MG per capsule Take 1 capsule by mouth at bedtime.   Yes Historical Provider, MD  ranitidine (ZANTAC) 150 MG tablet Take 150 mg by mouth 3 (three) times daily as needed for heartburn.   Yes Historical Provider, MD  solifenacin (VESICARE) 10 MG tablet Take 10 mg by mouth daily.   Yes Historical Provider, MD  Tetrahydrozoline HCl (VISINE OP) Place 2 drops into both eyes 4 (four) times daily as needed (dry eyes).   Yes Historical Provider, MD  tiZANidine (ZANAFLEX) 4 MG tablet Take 4 mg by mouth 2 (two) times daily as needed for muscle spasms.   Yes Historical Provider, MD   Vortioxetine HBr (BRINTELLIX) 10 MG TABS Take 10 mg by mouth every morning.    Yes Historical Provider, MD    Physical Exam: Filed Vitals:   02/13/14 0515 02/13/14 0545 02/13/14 0615 02/13/14 0645  BP: 124/56 117/55 136/64 141/67  Pulse: 57 65 71 66  Temp:      TempSrc:      Resp: 17 16 14 17   Height:      Weight:      SpO2: 95%  97% 97%    General: Alert, Awake and Oriented to Time, Place and Person. Appear in mild distress Eyes: PERRL ENT: Oral Mucosa clear dry. Neck: no JVD Cardiovascular: S1 and S2 Present, no Murmur, Peripheral Pulses Present Respiratory: Bilateral Air entry equal and Decreased, Clear to Auscultation, noCrackles, no wheezes Abdomen: Bowel Sound Present, Soft and mild diffuse tender Skin: no Rash Extremities: no Pedal edema, no calf tenderness Neurologic: Grossly no focal neuro deficit.  Labs on Admission:  CBC:  Recent Labs Lab 02/13/14 0245  WBC 7.8  HGB 13.3  HCT 40.5  MCV 94.4  PLT 251    CMP     Component Value Date/Time   NA 139 02/13/2014 0245   K 4.6 02/13/2014 0245   CL 97 02/13/2014 0245   CO2 21 02/13/2014 0245   GLUCOSE 256* 02/13/2014 0245   BUN 18 02/13/2014 0245   CREATININE 1.63* 02/13/2014 0245   CALCIUM 9.7 02/13/2014 0245   PROT 7.6 02/13/2014 0245   ALBUMIN 3.4* 02/13/2014 0245   AST 166* 02/13/2014 0245   ALT 216* 02/13/2014 0245   ALKPHOS 379* 02/13/2014 0245   BILITOT 1.4* 02/13/2014 0245   GFRNONAA 32* 02/13/2014 0245   GFRAA 37* 02/13/2014 0245    No results found for this basename: LIPASE, AMYLASE,  in the last 168 hours No results found for this basename: AMMONIA,  in the last 168 hours  No results found for this basename: CKTOTAL, CKMB, CKMBINDEX, TROPONINI,  in the last 168 hours BNP (last 3 results) No results found for this basename: PROBNP,  in the last 8760 hours  Radiological Exams on Admission: Ct Head Wo Contrast  02/13/2014   CLINICAL DATA:  Fall with head and neck pain  EXAM: CT HEAD WITHOUT CONTRAST   CT CERVICAL SPINE WITHOUT CONTRAST  TECHNIQUE: Multidetector CT imaging of the head and cervical spine was performed following the standard protocol without intravenous contrast. Multiplanar CT image reconstructions of the cervical spine were also generated.  COMPARISON:  08/08/2013 head CT  FINDINGS: CT HEAD FINDINGS  Skull and Sinuses:No acute fracture or destructive process. The mastoids, middle ears, and imaged paranasal sinuses are clear.  There is a right parietal approach ventriculoperitoneal shunt catheter. The catheter is in unchanged position, with tip near the foramen of Monro.  Orbits: Bilateral cataract resection.  Brain: No evidence  of acute abnormality, such as acute infarction, hemorrhage, or mass lesion/mass effect. When accounting for differences in scan angle, no convincing change in the ventricular volume. A fatty focus anti dependent within the right lateral ventricle is chronic, dating back to 2013 at least. This could represent fat introduced at the time of catheter placement. There is generalized brain atrophy, age advanced. Stable low-attenuation around the lateral ventricles, compatible with chronic small vessel disease.  CT CERVICAL SPINE FINDINGS  Negative for acute fracture or subluxation. No prevertebral edema. No gross cervical canal hematoma. There is diffuse degenerative disc narrowing. Associated spurring is most significant at C5-6, where there it is notable osseous bilateral foraminal stenosis. 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  IMPRESSION: 1. No acute intracranial or cervical spine injury. 2. Stable ventricular volume.   Electronically Signed   By: Jorje Guild M.D.   On: 02/13/2014 04:38   Ct Cervical Spine Wo Contrast  02/13/2014   CLINICAL DATA:  Fall with head and neck pain  EXAM: CT HEAD WITHOUT CONTRAST  CT CERVICAL SPINE WITHOUT CONTRAST  TECHNIQUE: Multidetector CT imaging of the head and cervical spine was performed following the standard protocol without  intravenous contrast. Multiplanar CT image reconstructions of the cervical spine were also generated.  COMPARISON:  08/08/2013 head CT  FINDINGS: CT HEAD FINDINGS  Skull and Sinuses:No acute fracture or destructive process. The mastoids, middle ears, and imaged paranasal sinuses are clear.  There is a right parietal approach ventriculoperitoneal shunt catheter. The catheter is in unchanged position, with tip near the foramen of Monro.  Orbits: Bilateral cataract resection.  Brain: No evidence of acute abnormality, such as acute infarction, hemorrhage, or mass lesion/mass effect. When accounting for differences in scan angle, no convincing change in the ventricular volume. A fatty focus anti dependent within the right lateral ventricle is chronic, dating back to 2013 at least. This could represent fat introduced at the time of catheter placement. There is generalized brain atrophy, age advanced. Stable low-attenuation around the lateral ventricles, compatible with chronic small vessel disease.  CT CERVICAL SPINE FINDINGS  Negative for acute fracture or subluxation. No prevertebral edema. No gross cervical canal hematoma. There is diffuse degenerative disc narrowing. Associated spurring is most significant at C5-6, where there it is notable osseous bilateral foraminal stenosis. 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  IMPRESSION: 1. No acute intracranial or cervical spine injury. 2. Stable ventricular volume.   Electronically Signed   By: Jorje Guild M.D.   On: 02/13/2014 04:38    EKG: Independently reviewed. normal sinus rhythm, nonspecific ST and T waves changes. Assessment/Plan Principal Problem:   Near syncope Active Problems:   AKI (acute kidney injury)   Hypotension   Fall   Nausea & vomiting   Transaminitis   History of cholecystectomy   1. Near syncope Patient is presenting with complaints of a fall. She was initially found to be hypotensive with blood pressure in her 60s and 70s. She  required status of normal saline to bring her blood pressure to normal. At present the etiology of her dehydration is unclear but orally poor oral intake is most likely due to her nausea and vomiting with abdominal pain. At present she does not have any abdominal tenderness. Her blood pressure dropped from 145-120 mm of active systolic. Currently I will admit her to the hospital I will give her IV hydration hold her antihypertensive medication and all pain limited echocardiogram. Also I will obtain a limited ultrasound of her  right upper quadrant. Avoid to be toxic and nephrotoxic medications.  2. Transaminitis Right upper quadrant ultrasound, avoid hepatotoxic medications. Probably due to hypotension.  3. Acute kidney injury Probably due to hypotension and prerenal etiology. IV hydration follow CMP and avoid nephrotoxic medications.  4. Mood disorder Patient is on 2 SSRI at the same time both of which can cause elevated LFTs, along with that olanzapine can also cause elevated LFTs. But due to significant mood disorder at present continuing them with him to monitor her a CMP.  5. Diabetes mellitus Patient's blood sugar is significantly elevated, although patient denies any polyuria. Last hemoglobin A1c in May 6.4, she's not on metformin. Place her on sliding scale and recheck a hemoglobin A1c.  DVT Prophylaxis: subcutaneous Heparin Nutrition: Cardiac diabetic  Code Status: Full  Disposition: Admitted to observation in telemetry unit.  Author: Berle Mull, MD [facility] Pager: [phone] 02/13/2014, 7:08 AM    If 7PM-7AM, please contact night-coverage [email or web address] Password [email or web address]  **Disclaimer: This note may have been dictated with voice recognition software. Similar sounding words can inadvertently be transcribed and this note may contain transcription errors which may not have been corrected upon publication of note.**

## 2014-02-13 NOTE — ED Notes (Addendum)
Pt arrives via [facility] with c/o fall. Pt states she lost her balance, denies LOC, states someone was playing a prank on her and thinks its Halloween. C/o neck pain, normal for her, back pain also present which is not normal for pt. No deformity, no radiation. CBG 271. C/o nausea, diaphoretic. 86/52. No IV access. NSR, 60 HR 94% on RA. Pt is oriented to self and place, thinks its October 2015, thinks [facility] Tawni Pummel is Software engineer. 1 inch lac to R hip.

## 2014-02-13 NOTE — Progress Notes (Signed)
  Echocardiogram 2D Echocardiogram has been performed.  Diamond Nickel 02/13/2014, 4:39 PM

## 2014-02-13 NOTE — ED Notes (Signed)
Dr. Patel at bedside 

## 2014-02-13 NOTE — ED Notes (Signed)
Patient was 88% on RA. RN noticed skin was pale and lips were pale. RN placed on 3L via [place]. Patient sats now 95% and color is WDL.

## 2014-02-13 NOTE — Progress Notes (Signed)
Utilization review completed.  

## 2014-02-13 NOTE — Evaluation (Signed)
Physical Therapy Evaluation Patient Details Name: [name] MRN: [id number] DOB: [birth year] Today's Date: 02/13/2014   History of Present Illness  Admitted with near syncope; was lightheaded, and fell down 3-4 steps at her home; Hypotensive in ER  Clinical Impression  Pt admitted with above. Pt currently with functional limitations due to the deficits listed below (see PT Problem List).  Pt will benefit from skilled PT to increase their independence and safety with mobility to allow discharge to the venue listed below.    Past Medical History  Diagnosis Date  . Depression   . Anxiety   . Hypertension   . Diabetes mellitus without complication   . MVP (mitral valve prolapse)   . Hydrocephalus        Follow Up Recommendations No PT follow up    Equipment Recommendations       Recommendations for Other Services       Precautions / Restrictions Precautions Precaution Comments: Cues to self-monitor for activity tolerance      Mobility  Bed Mobility Overal bed mobility: Modified Independent                Transfers Overall transfer level: Modified independent Equipment used: None             General transfer comment: Uses UEs to push off  Ambulation/Gait Ambulation/Gait assistance: Supervision Ambulation Distance (Feet): 25 Feet Assistive device: None Gait Pattern/deviations: Step-through pattern     General Gait Details: Overall walking well with good balance; cues to self-monitor for activity tolerance; Of note, pt asymptomatic for dizziness despite noted 20 mmHG drop in systolic BP  Stairs            Wheelchair Mobility    Modified Rankin (Stroke Patients Only)       Balance                                             Pertinent Vitals/Pain See other PT note of this date    Home Living Family/patient expects to be discharged to:: Private residence Living Arrangements: Alone Available Help at Discharge:  Available PRN/intermittently (from friends, not a lot) Type of Home: House Home Access: Stairs to enter     Home Layout: Two level;Bed/bath upstairs Home Equipment: Environmental consultant - 2 wheels Additional Comments: Does not use RW    Prior Function Level of Independence: Independent               Hand Dominance        Extremity/Trunk Assessment   Upper Extremity Assessment: Overall WFL for tasks assessed           Lower Extremity Assessment: Overall WFL for tasks assessed         Communication   Communication: No difficulties  Cognition Arousal/Alertness: Awake/alert Behavior During Therapy: WFL for tasks assessed/performed;Impulsive         Memory: Decreased short-term memory              General Comments      Exercises        Assessment/Plan    PT Assessment Patient needs continued PT services  PT Diagnosis Difficulty walking   PT Problem List Decreased activity tolerance  PT Treatment Interventions Gait training;Stair training;Functional mobility training;Therapeutic activities;Therapeutic exercise;Balance training;Patient/family education   PT Goals (Current goals can be found in the Care Plan section) Acute Rehab  PT Goals Patient Stated Goal: Hopes to go home soon PT Goal Formulation: With patient Time For Goal Achievement: 02/20/14 Potential to Achieve Goals: Good    Frequency Min 3X/week (likely one more session)   Barriers to discharge Decreased caregiver support      Co-evaluation               End of Session   Activity Tolerance: Patient tolerated treatment well Patient left: in chair;with call bell/phone within reach Nurse Communication: Mobility status    Functional Assessment Tool Used: clinical Judgement Functional Limitation: Mobility: Walking and moving around Mobility: Walking and Moving Around Current Status ([id number]): At least 1 percent but less than 20 percent impaired, limited or restricted Mobility: Walking and  Moving Around Goal Status [phone]): 0 percent impaired, limited or restricted    Time: 2505-3976 PT Time Calculation (min): 17 min   Charges:   PT Evaluation $Initial PT Evaluation Tier I: 1 Procedure PT Treatments $Gait Training: 8-22 mins   PT G Codes:   Functional Assessment Tool Used: clinical Judgement Functional Limitation: Mobility: Walking and moving around    [place], [place] 02/13/2014, 2:04 PM  Roney Marion, [place] Pager [phone] Office [phone]

## 2014-02-13 NOTE — ED Provider Notes (Signed)
CSN: [id number]     Arrival date & time 02/13/14  0227 History   First MD Initiated Contact with Patient 02/13/14 0257     Chief Complaint  Patient presents with  . Fall  . Hypotension     (Consider location/radiation/quality/duration/timing/severity/associated sxs/prior Treatment) HPI Comments: 68 year old morbidly obese female who lives by herself presents from home by paramedic transport after the patient had a fall. She states that for some time she has had a disequilibrium feeling like the ground is moving constantly, this occurred again this evening causing her to fall down 3 or 4 steps. The patient denies head injury, she was found on the ground in some broken glass with a very small laceration to her right hip approximately 1 cm in size, some dental pain and confused. The patient does not recall the fall other than knowing she fell down the stairs. She denies any other prodromal symptoms including rapid heartbeat, palpitations, chest pain, shortness of breath, blurred vision or syncope. She states that she falls several times a week because of her imbalance. She endorses having a prior workup for similar symptoms without definite answer. She is supposed to walk with a cane but does not use it.  Patient is a 68 y.o. female presenting with fall. The history is provided by the patient.  Fall    Past Medical History  Diagnosis Date  . Depression   . Anxiety   . Hypertension   . Diabetes mellitus without complication   . MVP (mitral valve prolapse)   . Hydrocephalus    Past Surgical History  Procedure Laterality Date  . Cholecystectomy    . Brain surgery     Family History  Problem Relation Age of Onset  . Hypertension Mother   . Heart disease Mother   . Stroke Mother   . Alcohol abuse Father   . Diabetes Father   . Cancer Neg Hx    History  Substance Use Topics  . Smoking status: Never Smoker   . Smokeless tobacco: Never Used  . Alcohol Use: No   OB History   Grav  Para Term Preterm Abortions TAB SAB Ect Mult Living                 Review of Systems  All other systems reviewed and are negative.     Allergies  Metformin and related; Amitriptyline hcl; Erythromycin; and Penicillins  Home Medications   Prior to Admission medications   Medication Sig Start Date End Date Taking? Authorizing Provider  Cholecalciferol 50000 UNITS TABS Take 1 tablet by mouth once a week. 12/28/13  Yes Janith Lima, MD  gabapentin (NEURONTIN) 300 MG capsule Take 300 mg by mouth 2 (two) times daily as needed (pain).   Yes Historical Provider, MD  HYDROcodone-acetaminophen (NORCO/VICODIN) 5-325 MG per tablet Take 1-2 tablets by mouth every 6 (six) hours as needed for moderate pain. 12/27/13  Yes Janith Lima, MD  ibuprofen (ADVIL,MOTRIN) 200 MG tablet Take 400 mg by mouth every 6 (six) hours as needed for headache.   Yes Historical Provider, MD  LORazepam (ATIVAN) 1 MG tablet Take 1 mg by mouth every 6 (six) hours as needed for anxiety. Take up to 4 times qd   Yes Historical Provider, MD  losartan (COZAAR) 100 MG tablet Take 100 mg by mouth daily.   Yes Historical Provider, MD  metFORMIN (GLUCOPHAGE) 500 MG tablet Take 500 mg by mouth daily with breakfast.   Yes Historical Provider, MD  OLANZapine-FLUoxetine (  SYMBYAX) 6-25 MG per capsule Take 1 capsule by mouth at bedtime.   Yes Historical Provider, MD  ranitidine (ZANTAC) 150 MG tablet Take 150 mg by mouth 3 (three) times daily as needed for heartburn.   Yes Historical Provider, MD  solifenacin (VESICARE) 10 MG tablet Take 10 mg by mouth daily.   Yes Historical Provider, MD  Tetrahydrozoline HCl (VISINE OP) Place 2 drops into both eyes 4 (four) times daily as needed (dry eyes).   Yes Historical Provider, MD  tiZANidine (ZANAFLEX) 4 MG tablet Take 4 mg by mouth 2 (two) times daily as needed for muscle spasms.   Yes Historical Provider, MD  Vortioxetine HBr (BRINTELLIX) 10 MG TABS Take 10 mg by mouth every morning.    Yes  Historical Provider, MD   BP 141/67  Pulse 66  Temp(Src) 97.8 F (36.6 C) (Oral)  Resp 17  Ht 5\' 5"  (1.651 m)  Wt 205 lb (92.987 kg)  BMI 34.11 kg/m2  SpO2 97% Physical Exam  Nursing note and vitals reviewed. Constitutional: She appears well-developed and well-nourished. No distress.  HENT:  Head: Normocephalic and atraumatic.  Mouth/Throat: Oropharynx is clear and moist. No oropharyngeal exudate.  Eyes: EOM are normal. Pupils are equal, round, and reactive to light. Right eye exhibits no discharge. Left eye exhibits no discharge. No scleral icterus.  Pale conjunctivae  Neck: Normal range of motion. Neck supple. No JVD present. No thyromegaly present.  Cardiovascular: Normal rate, regular rhythm, normal heart sounds and intact distal pulses.  Exam reveals no gallop and no friction rub.   No murmur heard. Pulmonary/Chest: Effort normal and breath sounds normal. No respiratory distress. She has no wheezes. She has no rales.  Abdominal: Soft. Bowel sounds are normal. She exhibits no distension and no mass. There is no tenderness.  Musculoskeletal: Normal range of motion. She exhibits no edema and no tenderness.  Lymphadenopathy:    She has no cervical adenopathy.  Neurological: She is alert. Coordination normal.  Normal speech, normal orientation, she knows where she is, date, circumstances, consummate the [employer] of the [place]. She moves all 4 extremities to command without difficulty with normal strength and coordination. Her speech is clear, cranial nerves III through XII are intact, there is no limb ataxia.  Skin: Skin is warm and dry. No rash noted. No erythema.  1 cm laceration to the right proximal thigh, superficial, no foreign body seen when explored  Psychiatric: She has a normal mood and affect. Her behavior is normal.    ED Course  Procedures (including critical care time) Labs Review Labs Reviewed  COMPREHENSIVE METABOLIC PANEL - Abnormal; Notable for the  following:    Glucose, Bld 256 (*)    Creatinine, Ser 1.63 (*)    Albumin 3.4 (*)    AST 166 (*)    ALT 216 (*)    Alkaline Phosphatase 379 (*)    Total Bilirubin 1.4 (*)    GFR calc non Af Amer 32 (*)    GFR calc Af Amer 37 (*)    Anion gap 21 (*)    All other components within normal limits  URINALYSIS, ROUTINE W REFLEX MICROSCOPIC - Abnormal; Notable for the following:    Color, Urine AMBER (*)    APPearance CLOUDY (*)    Bilirubin Urine SMALL (*)    Ketones, ur 15 (*)    Leukocytes, UA TRACE (*)    All other components within normal limits  URINE MICROSCOPIC-ADD ON - Abnormal; Notable for the  following:    Bacteria, UA MANY (*)    Casts HYALINE CASTS (*)    All other components within normal limits  CBG MONITORING, ED - Abnormal; Notable for the following:    Glucose-Capillary 243 (*)    All other components within normal limits  CBC  TROPONIN I    Imaging Review Ct Head Wo Contrast  02/13/2014   CLINICAL DATA:  Fall with head and neck pain  EXAM: CT HEAD WITHOUT CONTRAST  CT CERVICAL SPINE WITHOUT CONTRAST  TECHNIQUE: Multidetector CT imaging of the head and cervical spine was performed following the standard protocol without intravenous contrast. Multiplanar CT image reconstructions of the cervical spine were also generated.  COMPARISON:  08/08/2013 head CT  FINDINGS: CT HEAD FINDINGS  Skull and Sinuses:No acute fracture or destructive process. The mastoids, middle ears, and imaged paranasal sinuses are clear.  There is a right parietal approach ventriculoperitoneal shunt catheter. The catheter is in unchanged position, with tip near the foramen of Monro.  Orbits: Bilateral cataract resection.  Brain: No evidence of acute abnormality, such as acute infarction, hemorrhage, or mass lesion/mass effect. When accounting for differences in scan angle, no convincing change in the ventricular volume. A fatty focus anti dependent within the right lateral ventricle is chronic, dating back  to 2013 at least. This could represent fat introduced at the time of catheter placement. There is generalized brain atrophy, age advanced. Stable low-attenuation around the lateral ventricles, compatible with chronic small vessel disease.  CT CERVICAL SPINE FINDINGS  Negative for acute fracture or subluxation. No prevertebral edema. No gross cervical canal hematoma. There is diffuse degenerative disc narrowing. Associated spurring is most significant at C5-6, where there it is notable osseous bilateral foraminal stenosis. 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  IMPRESSION: 1. No acute intracranial or cervical spine injury. 2. Stable ventricular volume.   Electronically Signed   By: Jorje Guild M.D.   On: 02/13/2014 04:38   Ct Cervical Spine Wo Contrast  02/13/2014   CLINICAL DATA:  Fall with head and neck pain  EXAM: CT HEAD WITHOUT CONTRAST  CT CERVICAL SPINE WITHOUT CONTRAST  TECHNIQUE: Multidetector CT imaging of the head and cervical spine was performed following the standard protocol without intravenous contrast. Multiplanar CT image reconstructions of the cervical spine were also generated.  COMPARISON:  08/08/2013 head CT  FINDINGS: CT HEAD FINDINGS  Skull and Sinuses:No acute fracture or destructive process. The mastoids, middle ears, and imaged paranasal sinuses are clear.  There is a right parietal approach ventriculoperitoneal shunt catheter. The catheter is in unchanged position, with tip near the foramen of Monro.  Orbits: Bilateral cataract resection.  Brain: No evidence of acute abnormality, such as acute infarction, hemorrhage, or mass lesion/mass effect. When accounting for differences in scan angle, no convincing change in the ventricular volume. A fatty focus anti dependent within the right lateral ventricle is chronic, dating back to 2013 at least. This could represent fat introduced at the time of catheter placement. There is generalized brain atrophy, age advanced. Stable  low-attenuation around the lateral ventricles, compatible with chronic small vessel disease.  CT CERVICAL SPINE FINDINGS  Negative for acute fracture or subluxation. No prevertebral edema. No gross cervical canal hematoma. There is diffuse degenerative disc narrowing. Associated spurring is most significant at C5-6, where there it is notable osseous bilateral foraminal stenosis. 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  IMPRESSION: 1. No acute intracranial or cervical spine injury. 2. Stable ventricular volume.  Electronically Signed   By: Jorje Guild M.D.   On: 02/13/2014 04:38     EKG Interpretation None      MDM   Final diagnoses:  Renal insufficiency  Dehydration  Transaminitis  Orthostasis  Syncope, unspecified syncope type    The patient appears very pale, she is hypotensive initially in the 70s, this has improved to 90s with some fluids, her abdomen is soft, cardiac and pulmonary exams are normal and her neurologic exam is normal. I am unsure of the exact etiology of the patient's symptoms, I will need to rule out metabolic causes, anemia, infectious such as urine infection, imaging to rule out head or cervical spine injury. The laceration is very very small, no repair is needed, wound care given.  The patient has significant orthostasis with standing, given her ketonuria, renal insufficiency and history of vomiting and poor appetite this is likely dehydration. She'll need IV fluids and admission to the hospital. Discussed with the hospitalist Dr. Posey Pronto who agrees.    Johnna Acosta, MD 02/13/14 [phone]

## 2014-02-13 NOTE — Progress Notes (Addendum)
Patient seen this AM by Dr. Posey Pronto No abd pain No SOB Breakfast tray empty at bedside PT/OT eval pending  AMS- MRI, ammonia level, abx for UTI- urine culture  [name]

## 2014-02-13 NOTE — Progress Notes (Signed)
Pt failed to report her MRI conditional shunt while being cleared for her MRI. I saw she just had a MRI on 7/12 and took pt's word. Pt will need to be seen by Neurosurgery to reset her shunt this will need to happen with in 72 hours of being scanned. Pt is in scanner now @ 11pm 7/15,. Per pt Dr Trenton Gammon manages her shunt -Lynelle Doctor

## 2014-02-13 NOTE — Progress Notes (Signed)
Called ER RN for report. Room ready for admission.

## 2014-02-14 ENCOUNTER — Observation Stay (HOSPITAL_COMMUNITY): Payer: 59

## 2014-02-14 LAB — GLUCOSE, CAPILLARY
Glucose-Capillary: 116 mg/dL — ABNORMAL HIGH (ref 70–99)
Glucose-Capillary: 131 mg/dL — ABNORMAL HIGH (ref 70–99)
Glucose-Capillary: 119 mg/dL — ABNORMAL HIGH (ref 70–99)

## 2014-02-14 LAB — COMPREHENSIVE METABOLIC PANEL
ALT: 178 U/L — ABNORMAL HIGH (ref 0–35)
AST: 172 U/L — ABNORMAL HIGH (ref 0–37)
Albumin: 2.8 g/dL — ABNORMAL LOW (ref 3.5–5.2)
Alkaline Phosphatase: 357 U/L — ABNORMAL HIGH (ref 39–117)
Anion gap: 15 (ref 5–15)
BUN: 11 mg/dL (ref 6–23)
CO2: 23 mEq/L (ref 19–32)
Calcium: 8.8 mg/dL (ref 8.4–10.5)
Chloride: 104 mEq/L (ref 96–112)
Creatinine, Ser: 0.91 mg/dL (ref 0.50–1.10)
GFR calc Af Amer: 74 mL/min — ABNORMAL LOW (ref >90)
GFR calc non Af Amer: 64 mL/min — ABNORMAL LOW (ref >90)
Glucose, Bld: 142 mg/dL — ABNORMAL HIGH (ref 70–99)
Potassium: 4.3 mEq/L (ref 3.7–5.3)
Sodium: 142 mEq/L (ref 137–147)
Total Bilirubin: 3.6 mg/dL — ABNORMAL HIGH (ref 0.3–1.2)
Total Protein: 6.9 g/dL (ref 6.0–8.3)

## 2014-02-14 LAB — URINE CULTURE

## 2014-02-14 LAB — CBC
HCT: 39.3 % (ref 36.0–46.0)
Hemoglobin: 12.7 g/dL (ref 12.0–15.0)
MCH: 30.5 pg (ref 26.0–34.0)
MCHC: 32.3 g/dL (ref 30.0–36.0)
MCV: 94.2 fL (ref 78.0–100.0)
Platelets: 204 10*3/uL (ref 150–400)
RBC: 4.17 MIL/uL (ref 3.87–5.11)
RDW: 14.3 % (ref 11.5–15.5)
WBC: 7.7 10*3/uL (ref 4.0–10.5)

## 2014-02-14 MED ORDER — IOHEXOL 300 MG/ML  SOLN
100.0000 mL | Freq: Once | INTRAMUSCULAR | Status: AC | PRN
  Administered 2014-02-14: 100 mL via INTRAVENOUS

## 2014-02-14 MED ORDER — LOSARTAN POTASSIUM 50 MG PO TABS
100.0000 mg | ORAL_TABLET | Freq: Every day | ORAL | Status: DC
  Administered 2014-02-14 – 2014-02-15 (×2): 100 mg via ORAL
  Filled 2014-02-14 (×2): qty 2

## 2014-02-14 MED ORDER — IOHEXOL 300 MG/ML  SOLN
25.0000 mL | INTRAMUSCULAR | Status: AC
  Administered 2014-02-14 (×2): 25 mL via ORAL

## 2014-02-14 MED ORDER — HYDRALAZINE HCL 20 MG/ML IJ SOLN: 10.0000 mg | Freq: Four times a day (QID) | INTRAMUSCULAR | Status: DC | PRN

## 2014-02-14 NOTE — Progress Notes (Signed)
Utilization review completed.  

## 2014-02-14 NOTE — Evaluation (Signed)
Occupational Therapy Evaluation Patient Details Name: [name] MRN: [id number] DOB: 1945-09-14 Today's Date: 02/14/2014    History of Present Illness Admitted with near syncope; was lightheaded, and fell down 3-4 steps at her home; Hypotensive in ER   Clinical Impression   Patient presents initially as lethargic, dozing in bed.  Once awake, and sitting at edge of bed, patient able to very clearly state her current situation and prior living situation.  Patient able to safely complete very basic self care skills without assistance; transfer to commode, complete toileting, ambulate within room, don/doff lower body clothing and explain current medications and their purpose / schedule.  Patient able to verbalize slight increase in anxiety regarding tests being run.  Patient currently is not felt to need OT services, however if new information arises, or if situation occurs, please refer if indicated.        Follow Up Recommendations  No OT follow up    Equipment Recommendations  None recommended by OT    Recommendations for Other Services       Precautions / Restrictions Precautions Precautions: None Restrictions Weight Bearing Restrictions: No      Mobility Bed Mobility Overal bed mobility: Independent                Transfers Overall transfer level: Independent               General transfer comment: stood from bed, turned 360 - only limited by IV and pulse ox lines    Balance Overall balance assessment: No apparent balance deficits (not formally assessed)                                          ADL Overall ADL's : Independent                                       General ADL Comments: moves safely, has modified tub transfer style due to 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)     Vision                     Perception Perception Perception Tested?: Yes Spatial deficits: [facility]   Praxis Praxis Praxis  tested?: Within functional limits    Pertinent Vitals/Pain No pain     Hand Dominance Right   Extremity/Trunk Assessment Upper Extremity Assessment Upper Extremity Assessment: Overall WFL for tasks assessed   Lower Extremity Assessment Lower Extremity Assessment: Overall WFL for tasks assessed   Cervical / Trunk Assessment Cervical / Trunk Assessment: Normal   Communication Communication Communication: HOH   Cognition Arousal/Alertness: Awake/alert Behavior During Therapy: WFL for tasks assessed/performed Overall Cognitive Status: Within Functional Limits for tasks assessed       Memory:  (difficulty recalling one medications purpose)             General Comments       Exercises       Shoulder Instructions      Home Living Family/patient expects to be discharged to:: Private residence Living Arrangements: Alone Available Help at Discharge: Available PRN/intermittently Type of Home: House Home Access: Stairs to enter     Home Layout: Two level;Bed/bath upstairs Alternate Level Stairs-Number of Steps: 12 Alternate Level Stairs-Rails: Right Bathroom Shower/Tub: Tub/shower unit;Curtain Shower/tub characteristics: Architectural technologist: Standard  Home Equipment: Gilford Rile - 2 wheels   Additional Comments: Does not use RW      Prior Functioning/Environment Level of Independence: Independent             OT Diagnosis:     OT Problem List:     OT Treatment/Interventions:      OT Goals(Current goals can be found in the care plan section) Acute Rehab OT Goals Patient Stated Goal: Hopes to go home soon  OT Frequency:     Barriers to D/C:            Co-evaluation              End of Session    Activity Tolerance: Patient tolerated treatment well Patient left: in bed;with bed alarm set;with call bell/phone within reach   Time: 1250-1315 OT Time Calculation (min): 25 min Charges:  OT General Charges $OT Visit: 1 Procedure OT  Evaluation $Initial OT Evaluation Tier I: 1 Procedure OT Treatments $Self Care/Home Management : 8-22 mins G-Codes: OT G-codes **NOT FOR INPATIENT CLASS** Functional Limitation: Self care Self Care Current Status (H8469): At least 1 percent but less than 20 percent impaired, limited or restricted Self Care Goal Status (G2952): At least 1 percent but less than 20 percent impaired, limited or restricted Self Care Discharge Status [phone]): At least 1 percent but less than 20 percent impaired, limited or restricted  [name] 02/14/2014, 1:22 PM

## 2014-02-14 NOTE — Progress Notes (Signed)
PROGRESS NOTE  [name] KWI:[id number] DOB: 12/17/1945 DOA: 02/13/2014 PCP: [name], MD  Assessment/Plan: Near syncope  Due to dehydrattion D/c IVF PT eval done and no follow up  AKI -IVF -resolved  Transaminitis - fatty liver Right upper quadrant ultrasound suggests need for CT scan with increased size of ducts avoid hepatotoxic medications.  -Probably due to hypotension.  -side bar with GI -will trend LFTs  - no pain on exam -patient does endorse decreased appetite  Mood disorder  Patient is on 2 SSRI at the same time both of which can cause elevated LFTs, along with that olanzapine can also cause elevated LFTs. But due to significant mood disorder at present continuing them with him to monitor her a CMP.   Diabetes mellitus  Patient's blood sugar is significantly elevated, although patient denies any polyuria.  Last hemoglobin A1c in May 6.4, she's not on metformin.  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 R/o uti with culture- rocephin  Code Status: full Family Communication: patient Disposition Plan:    Consultants:  neurosurgery  Procedures:     HPI/Subjective: Not hungry No abd pain  Objective: Filed Vitals:   02/14/14 0500  BP: 147/86  Pulse: 91  Temp: 97.8 F (36.6 C)  Resp: 18    Intake/Output Summary (Last 24 hours) at 02/14/14 0903 Last data filed at 02/14/14 0026  Gross per 24 hour  Intake    480 ml  Output    100 ml  Net    380 ml   Filed Weights   02/13/14 0238 02/13/14 0809 02/13/14 2036  Weight: 92.987 kg (205 lb) 112.2 kg (247 lb 5.7 oz) 112.2 kg (247 lb 5.7 oz)    Exam:   General:  A+Ox3, NAD  Cardiovascular: rrr  Respiratory:  clear  Abdomen: +BS, soft, NT  Musculoskeletal: moves all 4 ext  Data Reviewed: Basic Metabolic Panel:  Recent Labs Lab 02/13/14 0245 02/14/14 0523  NA 139 142  K 4.6 4.3  CL 97 104  CO2 21 23  GLUCOSE 256* 142*  BUN 18 11  CREATININE 1.63* 0.91  CALCIUM 9.7 8.8   Liver Function Tests:  Recent Labs Lab 02/13/14 0245 02/14/14 0523  AST 166* 172*  ALT 216* 178*  ALKPHOS 379* 357*  BILITOT 1.4* 3.6*  PROT 7.6 6.9  ALBUMIN 3.4* 2.8*   No results found for this basename: LIPASE, AMYLASE,  in the last 168 hours  Recent Labs Lab 02/13/14 1624  AMMONIA 28   CBC:  Recent Labs Lab 02/13/14 0245 02/14/14 0523  WBC 7.8 7.7  HGB 13.3 12.7  HCT 40.5 39.3  MCV 94.4 94.2  PLT 251 204   Cardiac Enzymes:  Recent Labs Lab 02/13/14 0717  TROPONINI <0.30   BNP (last 3 results) No results found for this basename: PROBNP,  in the last 8760 hours CBG:  Recent Labs Lab 02/13/14 0812 02/13/14 1131 02/13/14 1621 02/13/14 2030 02/14/14 0745  GLUCAP 236* 195* 129* 186* 116*    No results found for this or any previous visit (from the past 240 hour(s)).   Studies: Ct Head Wo Contrast  02/13/2014   CLINICAL DATA:  Fall with head and neck pain  EXAM: CT HEAD  WITHOUT CONTRAST  CT CERVICAL SPINE WITHOUT CONTRAST  TECHNIQUE: Multidetector CT imaging of the head and cervical spine was performed following the standard protocol without intravenous contrast. Multiplanar CT image reconstructions of the cervical spine were also generated.  COMPARISON:  08/08/2013 head CT  FINDINGS: CT HEAD FINDINGS  Skull and Sinuses:No acute fracture or destructive process. The mastoids, middle ears, and imaged paranasal sinuses are clear.  There is a right parietal approach ventriculoperitoneal shunt catheter. The catheter is in unchanged position, with tip near the foramen of Monro.  Orbits: Bilateral cataract resection.  Brain: No evidence of acute abnormality, such as acute infarction,  hemorrhage, or mass lesion/mass effect. When accounting for differences in scan angle, no convincing change in the ventricular volume. A fatty focus anti dependent within the right lateral ventricle is chronic, dating back to 2013 at least. This could represent fat introduced at the time of catheter placement. There is generalized brain atrophy, age advanced. Stable low-attenuation around the lateral ventricles, compatible with chronic small vessel disease.  CT CERVICAL SPINE FINDINGS  Negative for acute fracture or subluxation. No prevertebral edema. No gross cervical canal hematoma. There is diffuse degenerative disc narrowing. Associated spurring is most significant at C5-6, where there it is notable osseous bilateral foraminal stenosis. 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  IMPRESSION: 1. No acute intracranial or cervical spine injury. 2. Stable ventricular volume.   Electronically Signed   By: Jorje Guild M.D.   On: 02/13/2014 04:38   Ct Cervical Spine Wo Contrast  02/13/2014   CLINICAL DATA:  Fall with head and neck pain  EXAM: CT HEAD WITHOUT CONTRAST  CT CERVICAL SPINE WITHOUT CONTRAST  TECHNIQUE: Multidetector CT imaging of the head and cervical spine was performed following the standard protocol without intravenous contrast. Multiplanar CT image reconstructions of the cervical spine were also generated.  COMPARISON:  08/08/2013 head CT  FINDINGS: CT HEAD FINDINGS  Skull and Sinuses:No acute fracture or destructive process. The mastoids, middle ears, and imaged paranasal sinuses are clear.  There is a right parietal approach ventriculoperitoneal shunt catheter. The catheter is in unchanged position, with tip near the foramen of Monro.  Orbits: Bilateral cataract resection.  Brain: No evidence of acute abnormality, such as acute infarction, hemorrhage, or mass lesion/mass effect. When accounting for differences in scan angle, no convincing change in the ventricular volume. A fatty focus anti  dependent within the right lateral ventricle is chronic, dating back to 2013 at least. This could represent fat introduced at the time of catheter placement. There is generalized brain atrophy, age advanced. Stable low-attenuation around the lateral ventricles, compatible with chronic small vessel disease.  CT CERVICAL SPINE FINDINGS  Negative for acute fracture or subluxation. No prevertebral edema. No gross cervical canal hematoma. There is diffuse degenerative disc narrowing. Associated spurring is most significant at C5-6, where there it is notable osseous bilateral foraminal stenosis. 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  IMPRESSION: 1. No acute intracranial or cervical spine injury. 2. Stable ventricular volume.   Electronically Signed   By: Jorje Guild M.D.   On: 02/13/2014 04:38   Mr Brain Wo Contrast  02/14/2014   CLINICAL DATA:  Altered mental status  EXAM: MRI HEAD WITHOUT CONTRAST  TECHNIQUE: Multiplanar, multiecho pulse sequences of the brain and surrounding structures were obtained without intravenous contrast.  COMPARISON:  06/03/2010  FINDINGS: The patient was unable to remain still during imaging. Sagittal T1 imaging was not performed.  Calvarium: No marrow signal abnormality.  Orbits: Bilateral cataract resection.  Sinuses: Clear. Mastoid and middle ears are clear.  Brain: There is a ventriculoperitoneal shunt catheter via right parietal approach. The lateral ventricular volume appears slightly greater than on head CT 08/08/2013. While the frontal horn morphology is similar, there appears is more fullness in the body/atria of the lateral ventricles and in the occipital horns. The temporal horns remain stable in size. The third and lateral ventricles are stable in size. There is periventricular T2 and FLAIR hyperintensity which is stable from preoperative MRI 06/03/2010, consistent with chronic small vessel disease. Generalized brain atrophy. No infarct, hemorrhage, mass lesion, or shift.  No evidence of major vessel occlusion.  These results were called by telephone at the time of interpretation on 02/14/2014 at 4:41 am to [place] , who verbally acknowledged these results.  IMPRESSION: 1. Subtly increased lateral ventricular volume compared to 08/08/2013. Consider shunt workup. 2. Negative for infarct.   Electronically Signed   By: Jorje Guild M.D.   On: 02/14/2014 04:43   US Abdomen Limited Ruq  02/13/2014   CLINICAL DATA:  Elevated liver function tests  EXAM: US ABDOMEN LIMITED - RIGHT UPPER QUADRANT  COMPARISON:  None.  FINDINGS: Gallbladder:  The gallbladder has previously been resected.  Common bile duct:  Diameter: The common bile duct is dilated measuring 12.0 mm in diameter possibly normal post cholecystectomy. Correlation with liver function tests recommended.  Liver:  There is central prominence of the intrahepatic ducts which may also be normal. The liver does appear somewhat echogenic suggesting fatty infiltration. No focal hepatic abnormality is seen.  IMPRESSION: 1. Prominent common bile duct and central intrahepatic ducts may be normal post cholecystectomy. Correlate with LFTs and consider CT or MRI of the abdomen if further assessment is warranted. 2. Somewhat echogenic liver parenchyma may indicate fatty infiltration. No focal abnormality is noted.   Electronically Signed   By: Ivar Drape M.D.   On: 02/13/2014 09:27    Scheduled Meds: . cefTRIAXone (ROCEPHIN)  IV  1 g Intravenous Q24H  . heparin  5,000 Units Subcutaneous 3 times per day  . insulin aspart  0-15 Units Subcutaneous TID WC  . insulin aspart  0-5 Units Subcutaneous QHS  . OLANZapine-FLUoxetine  1 capsule Oral QHS  . pantoprazole  40 mg Oral QAC breakfast  . sodium chloride  3 mL Intravenous Q12H  . Vortioxetine HBr  10 mg Oral Daily   Continuous Infusions: . sodium chloride 75 mL/hr at 02/14/14 0357   Antibiotics Given (last 72 hours)   Date/Time Action Medication Dose Rate   02/13/14 1637  Given   cefTRIAXone (ROCEPHIN) 1 g in dextrose 5 % 50 mL IVPB 1 g 100 mL/hr      Principal Problem:   Near syncope Active Problems:   AKI (acute kidney injury)   Hypotension   Fall   Nausea & vomiting   Transaminitis   History of cholecystectomy    Time spent: 25 min    Tandre Conly, [facility] Pager [phone]. If 7PM-7AM, please contact night-coverage at [email or web address], password [facility] 02/14/2014, 9:03 AM  LOS: 1 day

## 2014-02-14 NOTE — Consult Note (Signed)
Reason for Consult: Hydrocephalus Referring Physician: [name] is an 68 y.o. female.  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 Patient noted to have somewhat increased confusion ataxia and had some urinary incontinence. She is recently status post MRI scan of her cervical spine. She was supposed to report to my office for shunt reprograming but failed 2 keep her appointment. 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 This is a chronic condition will be managed in the outpatient setting. With regard to her mental status changes. It is likely that she has had some relative increase in her intracranial pressure leading to some worsening of her overall symptoms from her hydrocephalus.  Past Medical History  Diagnosis Date  . Depression   . Anxiety   . Hypertension   . Diabetes mellitus without complication   . MVP (mitral valve prolapse)   . Hydrocephalus     Past Surgical History  Procedure Laterality Date  . Cholecystectomy    . Brain surgery      Family History  Problem Relation Age of Onset  . Hypertension Mother   . Heart disease Mother   . Stroke Mother   . Alcohol abuse Father   . Diabetes Father   . Cancer Neg Hx     Social History:  reports that she has never smoked. She has never used smokeless tobacco. She reports that she does not drink alcohol or use illicit drugs.  Allergies:  Allergies  Allergen Reactions  . Metformin And Related     diarrhea  . Ace Inhibitors   . Amitriptyline Hcl Other (See Comments)    unknown  . Erythromycin Hives  . Penicillins Hives    Medications: I have reviewed the patient's current medications.  Results for orders placed during the hospital encounter of 02/13/14 (from the past 48 hour(s))  CBC     Status: None   Collection Time    02/13/14  2:45 AM      Result Value Ref Range   WBC 7.8  4.0 - 10.5 K/uL   RBC 4.29   3.87 - 5.11 MIL/uL   Hemoglobin 13.3  12.0 - 15.0 g/dL   HCT 40.5  36.0 - 46.0 %   MCV 94.4  78.0 - 100.0 fL   MCH 31.0  26.0 - 34.0 pg   MCHC 32.8  30.0 - 36.0 g/dL   RDW 14.0  11.5 - 15.5 %   Platelets 251  150 - 400 K/uL  COMPREHENSIVE METABOLIC PANEL     Status: Abnormal   Collection Time    02/13/14  2:45 AM      Result Value Ref Range   Sodium 139  137 - 147 mEq/L   Potassium 4.6  3.7 - 5.3 mEq/L   Chloride 97  96 - 112 mEq/L   CO2 21  19 - 32 mEq/L   Glucose, Bld 256 (*) 70 - 99 mg/dL   BUN 18  6 - 23 mg/dL   Creatinine, Ser 1.63 (*) 0.50 - 1.10 mg/dL   Calcium 9.7  8.4 - 10.5 mg/dL   Total Protein 7.6  6.0 - 8.3 g/dL   Albumin 3.4 (*) 3.5 - 5.2 g/dL   AST 166 (*) 0 - 37 U/L   ALT 216 (*) 0 - 35 U/L   Alkaline Phosphatase 379 (*) 39 - 117 U/L   Total Bilirubin 1.4 (*) 0.3 - 1.2 mg/dL  GFR calc non Af Amer 32 (*) >90 mL/min   GFR calc Af Amer 37 (*) >90 mL/min   Comment: (NOTE)     The eGFR has been calculated using the CKD EPI equation.     This calculation has not been validated in all clinical situations.     eGFR's persistently <90 mL/min signify possible Chronic Kidney     Disease.   Anion gap 21 (*) 5 - 15  CBG MONITORING, ED     Status: Abnormal   Collection Time    02/13/14  2:47 AM      Result Value Ref Range   Glucose-Capillary 243 (*) 70 - 99 mg/dL   Comment 1 Notify RN    URINALYSIS, ROUTINE W REFLEX MICROSCOPIC     Status: Abnormal   Collection Time    02/13/14  3:31 AM      Result Value Ref Range   Color, Urine AMBER (*) YELLOW   Comment: BIOCHEMICALS MAY BE AFFECTED BY COLOR   APPearance CLOUDY (*) CLEAR   Specific Gravity, Urine 1.021  1.005 - 1.030   pH 5.0  5.0 - 8.0   Glucose, UA NEGATIVE  NEGATIVE mg/dL   Hgb urine dipstick NEGATIVE  NEGATIVE   Bilirubin Urine SMALL (*) NEGATIVE   Ketones, ur 15 (*) NEGATIVE mg/dL   Protein, ur NEGATIVE  NEGATIVE mg/dL   Urobilinogen, UA 1.0  0.0 - 1.0 mg/dL   Nitrite NEGATIVE  NEGATIVE   Leukocytes,  UA TRACE (*) NEGATIVE  URINE MICROSCOPIC-ADD ON     Status: Abnormal   Collection Time    02/13/14  3:31 AM      Result Value Ref Range   Squamous Epithelial / LPF RARE  RARE   WBC, UA 0-2  <3 WBC/hpf   Bacteria, UA MANY (*) RARE   Casts HYALINE CASTS (*) NEGATIVE  TROPONIN I     Status: None   Collection Time    02/13/14  7:17 AM      Result Value Ref Range   Troponin I <0.30  <0.30 ng/mL   Comment:            Due to the release kinetics of cTnI,     a negative result within the first hours     of the onset of symptoms does not rule out     myocardial infarction with certainty.     If myocardial infarction is still suspected,     repeat the test at appropriate intervals.  HEMOGLOBIN A1C     Status: Abnormal   Collection Time    02/13/14  7:17 AM      Result Value Ref Range   Hemoglobin A1C 6.6 (*) <5.7 %   Comment: (NOTE)                                                                               According to the ADA Clinical Practice Recommendations for 2011, when     HbA1c is used as a screening test:      >=6.5%   Diagnostic of Diabetes Mellitus               (if abnormal result is  confirmed)     5.7-6.4%   Increased risk of developing Diabetes Mellitus     References:Diagnosis and Classification of Diabetes Mellitus,Diabetes     JJOA,4166,06(TKZSW 1):S62-S69 and Standards of Medical Care in             Diabetes - 2011,Diabetes FUXN,2355,73 (Suppl 1):S11-S61.   Mean Plasma Glucose 143 (*) <117 mg/dL   Comment: Performed at [facility], [facility]     Status: Abnormal   Collection Time    02/13/14  8:12 AM      Result Value Ref Range   Glucose-Capillary 236 (*) 70 - 99 mg/dL  GLUCOSE, CAPILLARY     Status: Abnormal   Collection Time    02/13/14 11:31 AM      Result Value Ref Range   Glucose-Capillary 195 (*) 70 - 99 mg/dL  GLUCOSE, CAPILLARY     Status: Abnormal   Collection Time    02/13/14  4:21 PM      Result Value Ref Range    Glucose-Capillary 129 (*) 70 - 99 mg/dL  AMMONIA     Status: None   Collection Time    02/13/14  4:24 PM      Result Value Ref Range   Ammonia 28  11 - 60 umol/L  GLUCOSE, CAPILLARY     Status: Abnormal   Collection Time    02/13/14  8:30 PM      Result Value Ref Range   Glucose-Capillary 186 (*) 70 - 99 mg/dL  CBC     Status: None   Collection Time    02/14/14  5:23 AM      Result Value Ref Range   WBC 7.7  4.0 - 10.5 K/uL   RBC 4.17  3.87 - 5.11 MIL/uL   Hemoglobin 12.7  12.0 - 15.0 g/dL   HCT 39.3  36.0 - 46.0 %   MCV 94.2  78.0 - 100.0 fL   MCH 30.5  26.0 - 34.0 pg   MCHC 32.3  30.0 - 36.0 g/dL   RDW 14.3  11.5 - 15.5 %   Platelets 204  150 - 400 K/uL  COMPREHENSIVE METABOLIC PANEL     Status: Abnormal   Collection Time    02/14/14  5:23 AM      Result Value Ref Range   Sodium 142  137 - 147 mEq/L   Potassium 4.3  3.7 - 5.3 mEq/L   Chloride 104  96 - 112 mEq/L   CO2 23  19 - 32 mEq/L   Glucose, Bld 142 (*) 70 - 99 mg/dL   BUN 11  6 - 23 mg/dL   Creatinine, Ser 0.91  0.50 - 1.10 mg/dL   Calcium 8.8  8.4 - 10.5 mg/dL   Total Protein 6.9  6.0 - 8.3 g/dL   Albumin 2.8 (*) 3.5 - 5.2 g/dL   AST 172 (*) 0 - 37 U/L   ALT 178 (*) 0 - 35 U/L   Alkaline Phosphatase 357 (*) 39 - 117 U/L   Total Bilirubin 3.6 (*) 0.3 - 1.2 mg/dL   GFR calc non Af Amer 64 (*) >90 mL/min   GFR calc Af Amer 74 (*) >90 mL/min   Comment: (NOTE)     The eGFR has been calculated using the CKD EPI equation.     This calculation has not been validated in all clinical situations.     eGFR's persistently <90 mL/min signify possible Chronic Kidney     Disease.  Anion gap 15  5 - 15    Ct Head Wo Contrast  02/13/2014   CLINICAL DATA:  Fall with head and neck pain  EXAM: CT HEAD WITHOUT CONTRAST  CT CERVICAL SPINE WITHOUT CONTRAST  TECHNIQUE: Multidetector CT imaging of the head and cervical spine was performed following the standard protocol without intravenous contrast. Multiplanar CT image  reconstructions of the cervical spine were also generated.  COMPARISON:  08/08/2013 head CT  FINDINGS: CT HEAD FINDINGS  Skull and Sinuses:No acute fracture or destructive process. The mastoids, middle ears, and imaged paranasal sinuses are clear.  There is a right parietal approach ventriculoperitoneal shunt catheter. The catheter is in unchanged position, with tip near the foramen of Monro.  Orbits: Bilateral cataract resection.  Brain: No evidence of acute abnormality, such as acute infarction, hemorrhage, or mass lesion/mass effect. When accounting for differences in scan angle, no convincing change in the ventricular volume. A fatty focus anti dependent within the right lateral ventricle is chronic, dating back to 2013 at least. This could represent fat introduced at the time of catheter placement. There is generalized brain atrophy, age advanced. Stable low-attenuation around the lateral ventricles, compatible with chronic small vessel disease.  CT CERVICAL SPINE FINDINGS  Negative for acute fracture or subluxation. No prevertebral edema. No gross cervical canal hematoma. There is diffuse degenerative disc narrowing. Associated spurring is most significant at C5-6, where there it is notable osseous bilateral foraminal stenosis. 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  IMPRESSION: 1. No acute intracranial or cervical spine injury. 2. Stable ventricular volume.   Electronically Signed   By: Jorje Guild M.D.   On: 02/13/2014 04:38   Ct Cervical Spine Wo Contrast  02/13/2014   CLINICAL DATA:  Fall with head and neck pain  EXAM: CT HEAD WITHOUT CONTRAST  CT CERVICAL SPINE WITHOUT CONTRAST  TECHNIQUE: Multidetector CT imaging of the head and cervical spine was performed following the standard protocol without intravenous contrast. Multiplanar CT image reconstructions of the cervical spine were also generated.  COMPARISON:  08/08/2013 head CT  FINDINGS: CT HEAD FINDINGS  Skull and Sinuses:No acute fracture or  destructive process. The mastoids, middle ears, and imaged paranasal sinuses are clear.  There is a right parietal approach ventriculoperitoneal shunt catheter. The catheter is in unchanged position, with tip near the foramen of Monro.  Orbits: Bilateral cataract resection.  Brain: No evidence of acute abnormality, such as acute infarction, hemorrhage, or mass lesion/mass effect. When accounting for differences in scan angle, no convincing change in the ventricular volume. A fatty focus anti dependent within the right lateral ventricle is chronic, dating back to 2013 at least. This could represent fat introduced at the time of catheter placement. There is generalized brain atrophy, age advanced. Stable low-attenuation around the lateral ventricles, compatible with chronic small vessel disease.  CT CERVICAL SPINE FINDINGS  Negative for acute fracture or subluxation. No prevertebral edema. No gross cervical canal hematoma. There is diffuse degenerative disc narrowing. Associated spurring is most significant at C5-6, where there it is notable osseous bilateral foraminal stenosis. 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  IMPRESSION: 1. No acute intracranial or cervical spine injury. 2. Stable ventricular volume.   Electronically Signed   By: Jorje Guild M.D.   On: 02/13/2014 04:38   Mr Brain Wo Contrast  02/14/2014   CLINICAL DATA:  Altered mental status  EXAM: MRI HEAD WITHOUT CONTRAST  TECHNIQUE: Multiplanar, multiecho pulse sequences of the brain and surrounding structures were  obtained without intravenous contrast.  COMPARISON:  06/03/2010  FINDINGS: The patient was unable to remain still during imaging. Sagittal T1 imaging was not performed.  Calvarium: No marrow signal abnormality.  Orbits: Bilateral cataract resection.  Sinuses: Clear. Mastoid and middle ears are clear.  Brain: There is a ventriculoperitoneal shunt catheter via right parietal approach. The lateral ventricular volume appears slightly  greater than on head CT 08/08/2013. While the frontal horn morphology is similar, there appears is more fullness in the body/atria of the lateral ventricles and in the occipital horns. The temporal horns remain stable in size. The third and lateral ventricles are stable in size. There is periventricular T2 and FLAIR hyperintensity which is stable from preoperative MRI 06/03/2010, consistent with chronic small vessel disease. Generalized brain atrophy. No infarct, hemorrhage, mass lesion, or shift. No evidence of major vessel occlusion.  These results were called by telephone at the time of interpretation on 02/14/2014 at 4:41 am to [place] , who verbally acknowledged these results.  IMPRESSION: 1. Subtly increased lateral ventricular volume compared to 08/08/2013. Consider shunt workup. 2. Negative for infarct.   Electronically Signed   By: Jorje Guild M.D.   On: 02/14/2014 04:43   US Abdomen Limited Ruq  02/13/2014   CLINICAL DATA:  Elevated liver function tests  EXAM: US ABDOMEN LIMITED - RIGHT UPPER QUADRANT  COMPARISON:  None.  FINDINGS: Gallbladder:  The gallbladder has previously been resected.  Common bile duct:  Diameter: The common bile duct is dilated measuring 12.0 mm in diameter possibly normal post cholecystectomy. Correlation with liver function tests recommended.  Liver:  There is central prominence of the intrahepatic ducts which may also be normal. The liver does appear somewhat echogenic suggesting fatty infiltration. No focal hepatic abnormality is seen.  IMPRESSION: 1. Prominent common bile duct and central intrahepatic ducts may be normal post cholecystectomy. Correlate with LFTs and consider CT or MRI of the abdomen if further assessment is warranted. 2. Somewhat echogenic liver parenchyma may indicate fatty infiltration. No focal abnormality is noted.   Electronically Signed   By: Ivar Drape M.D.   On: 02/13/2014 09:27    Pertinent items are noted in HPI. Blood pressure 147/86,  pulse 91, temperature 97.8 F (36.6 C), temperature source Oral, resp. rate 18, height $RemoveBe'5\' 5"'RotvoKJaP$  (1.651 m), weight 112.2 kg (247 lb 5.7 oz), SpO2 93.00%. Awake and alert. Mildly confused. Motor and sensory function intact. Cranial nerve function intact. Shunt pumps and refills well.  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 Plan to re\re program her shunt valve.  [name] A 02/14/2014, 8:09 AM

## 2014-02-14 NOTE — Progress Notes (Signed)
Physical Therapy Treatment Patient Details Name: [name] MRN: [id number] DOB: 01-Apr-1946 Today's Date: 02/14/2014    History of Present Illness Admitted with near syncope; was lightheaded, and fell down 3-4 steps at her home; Hypotensive in ER    PT Comments    Pt did well with ambulation with slight impulsiveness noted at times with decreased gait speed.  Follow Up Recommendations  No PT follow up     Equipment Recommendations  None recommended by PT    Recommendations for Other Services       Precautions / Restrictions      Mobility  Bed Mobility Overal bed mobility: Modified Independent                Transfers Overall transfer level: Modified independent               General transfer comment:  (slightly impulsive)  Ambulation/Gait Ambulation/Gait assistance: Supervision Ambulation Distance (Feet): 400 Feet Assistive device: None (occasional use of IV pole) Gait Pattern/deviations: Step-through pattern Gait velocity: decreased   General Gait Details: Pt did well with  head turns and obstacles.  Some SOB, but o2 94% and HR118 after gait   Stairs            Wheelchair Mobility    Modified Rankin (Stroke Patients Only)       Balance                                    Cognition Arousal/Alertness: Awake/alert Behavior During Therapy: WFL for tasks assessed/performed;Impulsive         Memory: Decreased short-term memory              Exercises      General Comments        Pertinent Vitals/Pain Denies pain.    Home Living                      Prior Function            PT Goals (current goals can now be found in the care plan section) Progress towards PT goals: Progressing toward goals    Frequency  Min 3X/week    PT Plan Current plan remains appropriate    Co-evaluation             End of Session   Activity Tolerance: Patient tolerated treatment well Patient left:  with call bell/phone within reach;in bed;with bed alarm set     Time: 6759-1638 PT Time Calculation (min): 17 min  Charges:  $Gait Training: 8-22 mins                    G Codes:  Functional Assessment Tool Used: clinical Judgement Functional Limitation: Mobility: Walking and moving around Mobility: Walking and Moving Around Current Status [phone]): At least 1 percent but less than 20 percent impaired, limited or restricted Mobility: Walking and Moving Around Goal Status [phone]): 0 percent impaired, limited or restricted   Macklen Wilhoite LUBECK 02/14/2014, 10:27 AM

## 2014-02-14 NOTE — Progress Notes (Signed)
Medtronic strata VP shunt valve reprogrammed to level 0.5.

## 2014-02-15 LAB — GLUCOSE, CAPILLARY
Glucose-Capillary: 151 mg/dL — ABNORMAL HIGH (ref 70–99)
Glucose-Capillary: 130 mg/dL — ABNORMAL HIGH (ref 70–99)
Glucose-Capillary: 136 mg/dL — ABNORMAL HIGH (ref 70–99)

## 2014-02-15 LAB — COMPREHENSIVE METABOLIC PANEL
ALT: 153 U/L — ABNORMAL HIGH (ref 0–35)
AST: 143 U/L — ABNORMAL HIGH (ref 0–37)
Albumin: 2.6 g/dL — ABNORMAL LOW (ref 3.5–5.2)
Alkaline Phosphatase: 328 U/L — ABNORMAL HIGH (ref 39–117)
Anion gap: 16 — ABNORMAL HIGH (ref 5–15)
BUN: 8 mg/dL (ref 6–23)
CO2: 23 mEq/L (ref 19–32)
Calcium: 8.7 mg/dL (ref 8.4–10.5)
Chloride: 100 mEq/L (ref 96–112)
Creatinine, Ser: 1.03 mg/dL (ref 0.50–1.10)
GFR calc Af Amer: 64 mL/min — ABNORMAL LOW (ref >90)
GFR calc non Af Amer: 55 mL/min — ABNORMAL LOW (ref >90)
Glucose, Bld: 127 mg/dL — ABNORMAL HIGH (ref 70–99)
Potassium: 3.8 mEq/L (ref 3.7–5.3)
Sodium: 139 mEq/L (ref 137–147)
Total Bilirubin: 3.9 mg/dL — ABNORMAL HIGH (ref 0.3–1.2)
Total Protein: 6.6 g/dL (ref 6.0–8.3)

## 2014-02-15 LAB — CBC
HCT: 39.1 % (ref 36.0–46.0)
Hemoglobin: 12.8 g/dL (ref 12.0–15.0)
MCH: 31 pg (ref 26.0–34.0)
MCHC: 32.7 g/dL (ref 30.0–36.0)
MCV: 94.7 fL (ref 78.0–100.0)
Platelets: 212 10*3/uL (ref 150–400)
RBC: 4.13 MIL/uL (ref 3.87–5.11)
RDW: 14.4 % (ref 11.5–15.5)
WBC: 5 10*3/uL (ref 4.0–10.5)

## 2014-02-15 LAB — HEPATITIS PANEL, ACUTE
HCV Ab: NEGATIVE
Hep A IgM: NONREACTIVE
Hep B C IgM: NONREACTIVE
Hepatitis B Surface Ag: NEGATIVE

## 2014-02-15 MED ORDER — METOPROLOL TARTRATE 25 MG PO TABS
25.0000 mg | ORAL_TABLET | Freq: Two times a day (BID) | ORAL | Status: DC
  Administered 2014-02-15: 25 mg via ORAL
  Filled 2014-02-15 (×2): qty 1

## 2014-02-15 MED ORDER — TRAMADOL HCL 50 MG PO TABS: 50.0000 mg | ORAL_TABLET | Freq: Four times a day (QID) | ORAL | Status: DC | PRN

## 2014-02-15 NOTE — Discharge Summary (Signed)
Physician Discharge Summary  [name] PXT:[id number] DOB: May 13, 1946 DOA: 02/13/2014  PCP: [name], MD  Admit date: 02/13/2014 Discharge date: 02/15/2014  Time spent: 35 minutes  Recommendations for Outpatient Follow-up:  1. Follow up CMP for elevated LE 2. Follow blood sugars- Hgb A1C 6.6  Discharge Diagnoses:  Principal Problem:   Near syncope Active Problems:   AKI (acute kidney injury)   Hypotension   Fall   Nausea & vomiting   Transaminitis   History of cholecystectomy   Discharge Condition: improved  Diet recommendation: diabetic/cardiac  Filed Weights   02/13/14 0809 02/13/14 2036 02/14/14 2108  Weight: 112.2 kg (247 lb 5.7 oz) 112.2 kg (247 lb 5.7 oz) 111.5 kg (245 lb 13 oz)    History of present illness:  [name] is a 68 y.o. female with Past medical history of hypertension, depression, anxiety, borderline diabetes, hydrocephalus, right occipital VP shunt .  Patient presented with fall. She mentions that she has been having complaints of intractable nausea and vomiting since last 3 weeks. She mentions her symptoms or not present on a daily basis and would happen 2-3 times a week. If she complains of chronic acid reflux. She was recently started on Vesicare. Other than this no other recent change in her history.  Today but she was walking the stairs she felt some dizziness and lightheadedness, and she lost her balance and fell down 3-4 steps. She was feeling weak and was unable to lift herself on her own for a while and was lying on the ground. Then when she finally was able to standup she called 911. She denied any palpitation, chest pain, shortness of breath, diarrhea, vomiting today.  She mentions she has chronic acid reflux and epigastric pain the pain is associated with food, and resolves only with vomiting   Hospital Course:  Near syncope  Due to dehydrattion  D/c IVF  PT eval done and no follow up   AKI  -IVF  -resolved    Transaminitis - fatty liver  Right upper quadrant ultrasound suggests need for CT scan with increased size of ducts  avoid hepatotoxic medications.  -Probably due to hypotension.  -side bar with GI - got CT Scan- trending down - no pain on exam    Mood disorder  Patient is on 2 SSRI at the same time both of which can cause elevated LFTs, along with that olanzapine can also cause elevated LFTs. But due to significant mood disorder at present continuing them with him to monitor her a CMP outpatient .   AMS  -resolved -ammonia ok  -seen by neurosurgery to re-set shunt placed for NPH: they believe that she has had some relative increase in her intracranial pressure leading to some worsening of her overall symptoms from her hydrocephalus.     Procedures:    Consultations:  neurosurgery  Discharge Exam: Filed Vitals:   02/15/14 0531  BP: 184/78  Pulse: 102  Temp: 98.4 F (36.9 C)  Resp: 19    General: A+Ox3, NAD- anxious to go home Cardiovascular: rrr Respiratory: clear  Discharge Instructions You were cared for by a hospitalist during your hospital stay. If you have any questions about your discharge medications or the care you received while you were in the hospital after you are discharged, you can call the unit and asked to speak with the hospitalist on call if the hospitalist that took care of you is not available. Once you are discharged, your primary care physician will  handle any further medical issues. Please note that NO REFILLS for any discharge medications will be authorized once you are discharged, as it is imperative that you return to your primary care physician (or establish a relationship with a primary care physician if you do not have one) for your aftercare needs so that they can reassess your need for medications and monitor your lab values.      Discharge Instructions   Diet - low sodium heart healthy    Complete by:  As directed      Diet Carb  Modified    Complete by:  As directed      Discharge instructions    Complete by:  As directed   Cbc, bmp 1 week     Increase activity slowly    Complete by:  As directed             Medication List    STOP taking these medications       HYDROcodone-acetaminophen 5-325 MG per tablet  Commonly known as:  NORCO/VICODIN      TAKE these medications       BRINTELLIX 10 MG Tabs  Generic drug:  Vortioxetine HBr  Take 10 mg by mouth every morning.     Cholecalciferol 50000 UNITS Tabs  Take 1 tablet by mouth once a week.     gabapentin 300 MG capsule  Commonly known as:  NEURONTIN  Take 300 mg by mouth 2 (two) times daily as needed (pain).     ibuprofen 200 MG tablet  Commonly known as:  ADVIL,MOTRIN  Take 400 mg by mouth every 6 (six) hours as needed for headache.     LORazepam 1 MG tablet  Commonly known as:  ATIVAN  Take 1 mg by mouth every 6 (six) hours as needed for anxiety. Take up to 4 times qd     losartan 100 MG tablet  Commonly known as:  COZAAR  Take 100 mg by mouth daily.     metFORMIN 500 MG tablet  Commonly known as:  GLUCOPHAGE  Take 500 mg by mouth daily with breakfast.     OLANZapine-FLUoxetine 6-25 MG per capsule  Commonly known as:  SYMBYAX  Take 1 capsule by mouth at bedtime.     ranitidine 150 MG tablet  Commonly known as:  ZANTAC  Take 150 mg by mouth 3 (three) times daily as needed for heartburn.     solifenacin 10 MG tablet  Commonly known as:  VESICARE  Take 10 mg by mouth daily.     tiZANidine 4 MG tablet  Commonly known as:  ZANAFLEX  Take 4 mg by mouth 2 (two) times daily as needed for muscle spasms.     traMADol 50 MG tablet  Commonly known as:  ULTRAM  Take 1 tablet (50 mg total) by mouth every 6 (six) hours as needed for moderate pain.     VISINE OP  Place 2 drops into both eyes 4 (four) times daily as needed (dry eyes).       Allergies  Allergen Reactions  . Metformin And Related     diarrhea  . Ace Inhibitors   .  Amitriptyline Hcl Other (See Comments)    unknown  . Erythromycin Hives  . Penicillins Hives   Follow-up Information   Follow up with [name], MD In 1 week. (for BP check)    Specialty:  Internal Medicine   Contact information:   [address]  [address] [phone]        The results of significant diagnostics from this hospitalization (including imaging, microbiology, ancillary and laboratory) are listed below for reference.    Significant Diagnostic Studies: Ct Head Wo Contrast  02/13/2014   CLINICAL DATA:  Fall with head and neck pain  EXAM: CT HEAD WITHOUT CONTRAST  CT CERVICAL SPINE WITHOUT CONTRAST  TECHNIQUE: Multidetector CT imaging of the head and cervical spine was performed following the standard protocol without intravenous contrast. Multiplanar CT image reconstructions of the cervical spine were also generated.  COMPARISON:  08/08/2013 head CT  FINDINGS: CT HEAD FINDINGS  Skull and Sinuses:No acute fracture or destructive process. The mastoids, middle ears, and imaged paranasal sinuses are clear.  There is a right parietal approach ventriculoperitoneal shunt catheter. The catheter is in unchanged position, with tip near the foramen of Monro.  Orbits: Bilateral cataract resection.  Brain: No evidence of acute abnormality, such as acute infarction, hemorrhage, or mass lesion/mass effect. When accounting for differences in scan angle, no convincing change in the ventricular volume. A fatty focus anti dependent within the right lateral ventricle is chronic, dating back to 2013 at least. This could represent fat introduced at the time of catheter placement. There is generalized brain atrophy, age advanced. Stable low-attenuation around the lateral ventricles, compatible with chronic small vessel disease.  CT CERVICAL SPINE FINDINGS  Negative for acute fracture or subluxation. No prevertebral edema. No gross cervical canal hematoma. There is diffuse  degenerative disc narrowing. Associated spurring is most significant at C5-6, where there it is notable osseous bilateral foraminal stenosis. 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  IMPRESSION: 1. No acute intracranial or cervical spine injury. 2. Stable ventricular volume.   Electronically Signed   By: Jorje Guild M.D.   On: 02/13/2014 04:38   Ct Cervical Spine Wo Contrast  02/13/2014   CLINICAL DATA:  Fall with head and neck pain  EXAM: CT HEAD WITHOUT CONTRAST  CT CERVICAL SPINE WITHOUT CONTRAST  TECHNIQUE: Multidetector CT imaging of the head and cervical spine was performed following the standard protocol without intravenous contrast. Multiplanar CT image reconstructions of the cervical spine were also generated.  COMPARISON:  08/08/2013 head CT  FINDINGS: CT HEAD FINDINGS  Skull and Sinuses:No acute fracture or destructive process. The mastoids, middle ears, and imaged paranasal sinuses are clear.  There is a right parietal approach ventriculoperitoneal shunt catheter. The catheter is in unchanged position, with tip near the foramen of Monro.  Orbits: Bilateral cataract resection.  Brain: No evidence of acute abnormality, such as acute infarction, hemorrhage, or mass lesion/mass effect. When accounting for differences in scan angle, no convincing change in the ventricular volume. A fatty focus anti dependent within the right lateral ventricle is chronic, dating back to 2013 at least. This could represent fat introduced at the time of catheter placement. There is generalized brain atrophy, age advanced. Stable low-attenuation around the lateral ventricles, compatible with chronic small vessel disease.  CT CERVICAL SPINE FINDINGS  Negative for acute fracture or subluxation. No prevertebral edema. No gross cervical canal hematoma. There is diffuse degenerative disc narrowing. Associated spurring is most significant at C5-6, where there it is notable osseous bilateral foraminal stenosis. Ankylosis of the  right C2-3 facet joint.  IMPRESSION: 1. No acute intracranial or cervical spine injury. 2. Stable ventricular volume.   Electronically Signed   By: Jorje Guild M.D.   On: 02/13/2014 04:38   Ct Abdomen W Contrast  02/14/2014   CLINICAL  DATA:  Elevated liver function test. Prominent common bile duct on ultrasound.  EXAM: CT ABDOMEN WITH CONTRAST  TECHNIQUE: Multidetector CT imaging of the abdomen was performed using the standard protocol following bolus administration of intravenous contrast.  CONTRAST:  176mL OMNIPAQUE IOHEXOL 300 MG/ML  SOLN  COMPARISON:  02/13/2013 right upper quadrant ultrasound. Abdominal ultrasound 04/08/2004.  FINDINGS: Lower Chest: Bibasilar scarring and dependent atelectasis. Mild cardiomegaly, without pericardial or pleural effusion. Trace left pleural thickening. Small hiatal hernia.  Abdomen:  Mild hepatomegaly, 18.1 cm craniocaudal.  Normal spleen. The gastric body is underdistended. Descending duodenal diverticulum.  Atrophic pancreas. Cholecystectomy. Pneumobilia. Intrahepatic ducts are otherwise normal for prior cholecystectomy state. The common duct measures 1.0 cm, upper normal. There is equivocal interstitial thickening along the course of the common duct and adjacent descending duodenum. Example image 29/series 2.  No obstructive stone or mass.  No pancreatic ductal dilatation.  Normal adrenal glands and kidneys.  Incompletely imaged right-sided VP shunt catheter.  No retroperitoneal or retrocrural adenopathy. Scattered colonic diverticula. Normal abdominal small bowel without ascites.  High ventral abdominal wall hernia contains perihepatic fat and the superior aspect of the VP shunt catheter on image 30.  Bones/Musculoskeletal:  Grade 1 anterolisthesis of L4 on L5.  IMPRESSION: 1. Prior cholecystectomy with pneumobilia. Common duct upper normal without evidence of obstructive stone or mass. 2. Equivocal interstitial thickening in the region of the porta hepatis and along  the course of the common duct. Presuming the patient's elevated liver function tests are incidental and isolated, this is likely within normal variation and may be partially artifactual secondary to patient body habitus. If however, the clinical symptoms suggest alternate explanation such as cholangitis or duodenitis, these would be alternate concerns. 3. Fat containing high ventral abdominal wall hernia.   Electronically Signed   By: Abigail Miyamoto M.D.   On: 02/14/2014 16:10   Mr Brain Wo Contrast  02/14/2014   CLINICAL DATA:  Altered mental status  EXAM: MRI HEAD WITHOUT CONTRAST  TECHNIQUE: Multiplanar, multiecho pulse sequences of the brain and surrounding structures were obtained without intravenous contrast.  COMPARISON:  06/03/2010  FINDINGS: The patient was unable to remain still during imaging. Sagittal T1 imaging was not performed.  Calvarium: No marrow signal abnormality.  Orbits: Bilateral cataract resection.  Sinuses: Clear. Mastoid and middle ears are clear.  Brain: There is a ventriculoperitoneal shunt catheter via right parietal approach. The lateral ventricular volume appears slightly greater than on head CT 08/08/2013. While the frontal horn morphology is similar, there appears is more fullness in the body/atria of the lateral ventricles and in the occipital horns. The temporal horns remain stable in size. The third and lateral ventricles are stable in size. There is periventricular T2 and FLAIR hyperintensity which is stable from preoperative MRI 06/03/2010, consistent with chronic small vessel disease. Generalized brain atrophy. No infarct, hemorrhage, mass lesion, or shift. No evidence of major vessel occlusion.  These results were called by telephone at the time of interpretation on 02/14/2014 at 4:41 am to [place] , who verbally acknowledged these results.  IMPRESSION: 1. Subtly increased lateral ventricular volume compared to 08/08/2013. Consider shunt workup. 2. Negative for infarct.    Electronically Signed   By: Jorje Guild M.D.   On: 02/14/2014 04:43   Mr Cervical Spine Wo Contrast  02/10/2014   CLINICAL DATA:  Neck pain which radiates between the shoulder blades with numbness and weakness in the left arm.  EXAM: MRI CERVICAL SPINE WITHOUT CONTRAST  TECHNIQUE:  Multiplanar, multisequence MR imaging of the cervical spine was performed. No intravenous contrast was administered.  COMPARISON:  Cervical radiographs dated 12/27/2013  FINDINGS: The visualized intracranial contents, paraspinal soft tissues, and cervical spinal cord appear normal.  C1-2:  Normal.  C2-3: Auto fusion of the right facet joint. Normal disc. Widely patent neural foramina.  C3-4:  Normal.  C4-5: Tiny broad-based disc bulge with accompanying osteophytes without neural impingement.  C5-6: Disc space narrowing with prominent broad-based disc osteophyte complex extending into both lateral recesses and both neural foramina with severe left foraminal stenosis. The findings could affect either or both C6 nerves but more likely the left C6 nerve because of the foraminal stenosis. There is narrowing of the AP dimension of the spinal canal but the spinal cord is not compressed.  C6-7: Disc space narrowing with broad-based disc osteophyte complex extending into both lateral recesses and neural foramina with minimal narrowing of the neural foramina.  C7-T1:  Normal.  T1-2 and T2-3:  Normal.  IMPRESSION: 1. Broad-based disc osteophyte complex at C5-6 asymmetric to the left with severe left foraminal stenosis. The findings could affect either or both C6 nerves. 2. Smaller disc osteophyte complexes at C4-5 and C5-6 without focal neural impingement. 3. Auto fusion of the left facet joint at C2-3.   Electronically Signed   By: Rozetta Nunnery M.D.   On: 02/10/2014 16:14   US Abdomen Limited Ruq  02/13/2014   CLINICAL DATA:  Elevated liver function tests  EXAM: US ABDOMEN LIMITED - RIGHT UPPER QUADRANT  COMPARISON:  None.  FINDINGS:  Gallbladder:  The gallbladder has previously been resected.  Common bile duct:  Diameter: The common bile duct is dilated measuring 12.0 mm in diameter possibly normal post cholecystectomy. Correlation with liver function tests recommended.  Liver:  There is central prominence of the intrahepatic ducts which may also be normal. The liver does appear somewhat echogenic suggesting fatty infiltration. No focal hepatic abnormality is seen.  IMPRESSION: 1. Prominent common bile duct and central intrahepatic ducts may be normal post cholecystectomy. Correlate with LFTs and consider CT or MRI of the abdomen if further assessment is warranted. 2. Somewhat echogenic liver parenchyma may indicate fatty infiltration. No focal abnormality is noted.   Electronically Signed   By: Ivar Drape M.D.   On: 02/13/2014 09:27    Microbiology: Recent Results (from the past 240 hour(s))  URINE CULTURE     Status: None   Collection Time    02/13/14  5:06 PM      Result Value Ref Range Status   Specimen Description URINE, RANDOM   Final   Special Requests NONE   Final   Culture  Setup Time     Final   Value: 02/13/2014 18:27     Performed at [facility]     Final   Value: 20,OOO COLONIES/ML     Performed at [facility]   Culture     Final   Value: Multiple bacterial morphotypes present, none predominant. Suggest appropriate recollection if clinically indicated.     Performed at [facility]   Report Status 02/14/2014 FINAL   Final     Labs: Basic Metabolic Panel:  Recent Labs Lab 02/13/14 0245 02/14/14 0523 02/15/14 0003  NA 139 142 139  K 4.6 4.3 3.8  CL 97 104 100  CO2 21 23 23   GLUCOSE 256* 142* 127*  BUN 18 11 8   CREATININE 1.63* 0.91 1.03  CALCIUM  9.7 8.8 8.7   Liver Function Tests:  Recent Labs Lab 02/13/14 0245 02/14/14 0523 02/15/14 0003  AST 166* 172* 143*  ALT 216* 178* 153*  ALKPHOS 379* 357* 328*  BILITOT 1.4* 3.6* 3.9*  PROT 7.6 6.9 6.6   ALBUMIN 3.4* 2.8* 2.6*   No results found for this basename: LIPASE, AMYLASE,  in the last 168 hours  Recent Labs Lab 02/13/14 1624  AMMONIA 28   CBC:  Recent Labs Lab 02/13/14 0245 02/14/14 0523 02/15/14 0053  WBC 7.8 7.7 5.0  HGB 13.3 12.7 12.8  HCT 40.5 39.3 39.1  MCV 94.4 94.2 94.7  PLT 251 204 212   Cardiac Enzymes:  Recent Labs Lab 02/13/14 0717  TROPONINI <0.30   BNP: BNP (last 3 results) No results found for this basename: PROBNP,  in the last 8760 hours CBG:  Recent Labs Lab 02/14/14 1130 02/14/14 1717 02/14/14 2105 02/15/14 0737 02/15/14 1241  GLUCAP 131* 151* 119* 130* 136*       Signed:  Verley Pariseau  [facility] 02/15/2014, 3:14 PM

## 2014-02-15 NOTE — Progress Notes (Signed)
Physical Therapy Treatment Patient Details Name: STELLAR GENSEL MRN: [id number] DOB: 12-13-1945 Today's Date: 02/15/2014    History of Present Illness Admitted with near syncope; was lightheaded, and fell down 3-4 steps at her home; Hypotensive in ER    PT Comments    Pt negotiated stairs at S level.  Planned d/c home today with no PT follow up.  Follow Up Recommendations  No PT follow up     Equipment Recommendations  None recommended by PT    Recommendations for Other Services       Precautions / Restrictions      Mobility  Bed Mobility Overal bed mobility: Independent                Transfers Overall transfer level: Independent                  Ambulation/Gait Ambulation/Gait assistance: Supervision Ambulation Distance (Feet): 300 Feet Assistive device: None Gait Pattern/deviations: Step-through pattern   Gait velocity interpretation: at or above normal speed for age/gender General Gait Details: no LOB noted.   Stairs Stairs: Yes Stairs assistance: Supervision Stair Management: One rail Left;Step to pattern Number of Stairs: 6 General stair comments: Pt reports she always does step to pattern.  Wheelchair Mobility    Modified Rankin (Stroke Patients Only)       Balance Overall balance assessment: No apparent balance deficits (not formally assessed)                                  Cognition Arousal/Alertness: Awake/alert Behavior During Therapy: WFL for tasks assessed/performed Overall Cognitive Status: Within Functional Limits for tasks assessed                      Exercises      General Comments        Pertinent Vitals/Pain No c/o pain.    Home Living                      Prior Function            PT Goals (current goals can now be found in the care plan section) Acute Rehab PT Goals PT Goal Formulation: With patient Time For Goal Achievement: 02/20/14 Potential to Achieve  Goals: Good Progress towards PT goals: Progressing toward goals    Frequency  Min 3X/week    PT Plan Current plan remains appropriate    Co-evaluation             End of Session Equipment Utilized During Treatment: Gait belt Activity Tolerance: Patient tolerated treatment well Patient left: in bed;with bed alarm set;with call bell/phone within reach (alarm set per nursing request)     Time: 4627-0350 PT Time Calculation (min): 11 min  Charges:  $Gait Training: 8-22 mins                    G Codes:      Gardy Montanari LUBECK 02/15/2014, 11:39 AM

## 2014-02-22 ENCOUNTER — Emergency Department (HOSPITAL_COMMUNITY): Payer: PRIVATE HEALTH INSURANCE

## 2014-02-22 ENCOUNTER — Inpatient Hospital Stay (HOSPITAL_COMMUNITY)
Admission: EM | Admit: 2014-02-22 | Discharge: 2014-03-01 | DRG: 445 | Disposition: A | Discharge status: Home / Self Care | Payer: PRIVATE HEALTH INSURANCE | Attending: Internal Medicine | Admitting: Internal Medicine

## 2014-02-22 ENCOUNTER — Encounter (HOSPITAL_COMMUNITY): Payer: Self-pay | Admitting: Emergency Medicine

## 2014-02-22 DIAGNOSIS — E872 Acidosis, unspecified: Secondary | ICD-10-CM | POA: Diagnosis present

## 2014-02-22 DIAGNOSIS — K769 Liver disease, unspecified: Secondary | ICD-10-CM | POA: Diagnosis present

## 2014-02-22 DIAGNOSIS — N289 Disorder of kidney and ureter, unspecified: Secondary | ICD-10-CM

## 2014-02-22 DIAGNOSIS — E1142 Type 2 diabetes mellitus with diabetic polyneuropathy: Secondary | ICD-10-CM | POA: Diagnosis present

## 2014-02-22 DIAGNOSIS — I10 Essential (primary) hypertension: Secondary | ICD-10-CM

## 2014-02-22 DIAGNOSIS — Z982 Presence of cerebrospinal fluid drainage device: Secondary | ICD-10-CM | POA: Diagnosis not present

## 2014-02-22 DIAGNOSIS — I498 Other specified cardiac arrhythmias: Secondary | ICD-10-CM | POA: Diagnosis present

## 2014-02-22 DIAGNOSIS — R740 Nonspecific elevation of levels of transaminase and lactic acid dehydrogenase [LDH]: Secondary | ICD-10-CM

## 2014-02-22 DIAGNOSIS — R7989 Other specified abnormal findings of blood chemistry: Secondary | ICD-10-CM

## 2014-02-22 DIAGNOSIS — Z880 Allergy status to penicillin: Secondary | ICD-10-CM | POA: Diagnosis not present

## 2014-02-22 DIAGNOSIS — Z9049 Acquired absence of other specified parts of digestive tract: Secondary | ICD-10-CM

## 2014-02-22 DIAGNOSIS — E669 Obesity, unspecified: Secondary | ICD-10-CM | POA: Diagnosis present

## 2014-02-22 DIAGNOSIS — Z1231 Encounter for screening mammogram for malignant neoplasm of breast: Secondary | ICD-10-CM

## 2014-02-22 DIAGNOSIS — E1149 Type 2 diabetes mellitus with other diabetic neurological complication: Secondary | ICD-10-CM | POA: Diagnosis present

## 2014-02-22 DIAGNOSIS — R42 Dizziness and giddiness: Secondary | ICD-10-CM | POA: Diagnosis present

## 2014-02-22 DIAGNOSIS — F40298 Other specified phobia: Secondary | ICD-10-CM | POA: Diagnosis present

## 2014-02-22 DIAGNOSIS — E559 Vitamin D deficiency, unspecified: Secondary | ICD-10-CM

## 2014-02-22 DIAGNOSIS — K8050 Calculus of bile duct without cholangitis or cholecystitis without obstruction: Principal | ICD-10-CM

## 2014-02-22 DIAGNOSIS — Z9181 History of falling: Secondary | ICD-10-CM

## 2014-02-22 DIAGNOSIS — R32 Unspecified urinary incontinence: Secondary | ICD-10-CM | POA: Diagnosis present

## 2014-02-22 DIAGNOSIS — G43909 Migraine, unspecified, not intractable, without status migrainosus: Secondary | ICD-10-CM | POA: Diagnosis present

## 2014-02-22 DIAGNOSIS — G911 Obstructive hydrocephalus: Secondary | ICD-10-CM | POA: Diagnosis present

## 2014-02-22 DIAGNOSIS — W19XXXA Unspecified fall, initial encounter: Secondary | ICD-10-CM

## 2014-02-22 DIAGNOSIS — K7689 Other specified diseases of liver: Secondary | ICD-10-CM | POA: Diagnosis present

## 2014-02-22 DIAGNOSIS — R001 Bradycardia, unspecified: Secondary | ICD-10-CM

## 2014-02-22 DIAGNOSIS — I951 Orthostatic hypotension: Secondary | ICD-10-CM | POA: Diagnosis present

## 2014-02-22 DIAGNOSIS — Z0000 Encounter for general adult medical examination without abnormal findings: Secondary | ICD-10-CM

## 2014-02-22 DIAGNOSIS — Z6841 Body Mass Index (BMI) 40.0 and over, adult: Secondary | ICD-10-CM | POA: Diagnosis not present

## 2014-02-22 DIAGNOSIS — F319 Bipolar disorder, unspecified: Secondary | ICD-10-CM | POA: Diagnosis present

## 2014-02-22 DIAGNOSIS — I959 Hypotension, unspecified: Secondary | ICD-10-CM

## 2014-02-22 DIAGNOSIS — R55 Syncope and collapse: Secondary | ICD-10-CM

## 2014-02-22 DIAGNOSIS — R112 Nausea with vomiting, unspecified: Secondary | ICD-10-CM

## 2014-02-22 DIAGNOSIS — Y92009 Unspecified place in unspecified non-institutional (private) residence as the place of occurrence of the external cause: Secondary | ICD-10-CM

## 2014-02-22 DIAGNOSIS — M8580 Other specified disorders of bone density and structure, unspecified site: Secondary | ICD-10-CM

## 2014-02-22 DIAGNOSIS — N3281 Overactive bladder: Secondary | ICD-10-CM

## 2014-02-22 DIAGNOSIS — N179 Acute kidney failure, unspecified: Secondary | ICD-10-CM | POA: Diagnosis present

## 2014-02-22 DIAGNOSIS — R945 Abnormal results of liver function studies: Secondary | ICD-10-CM

## 2014-02-22 DIAGNOSIS — E860 Dehydration: Secondary | ICD-10-CM | POA: Diagnosis present

## 2014-02-22 DIAGNOSIS — R7309 Other abnormal glucose: Secondary | ICD-10-CM

## 2014-02-22 DIAGNOSIS — E538 Deficiency of other specified B group vitamins: Secondary | ICD-10-CM

## 2014-02-22 DIAGNOSIS — M542 Cervicalgia: Secondary | ICD-10-CM

## 2014-02-22 DIAGNOSIS — F411 Generalized anxiety disorder: Secondary | ICD-10-CM | POA: Diagnosis present

## 2014-02-22 DIAGNOSIS — R7401 Elevation of levels of liver transaminase levels: Secondary | ICD-10-CM

## 2014-02-22 DIAGNOSIS — Z79899 Other long term (current) drug therapy: Secondary | ICD-10-CM | POA: Diagnosis not present

## 2014-02-22 DIAGNOSIS — G910 Communicating hydrocephalus: Secondary | ICD-10-CM

## 2014-02-22 DIAGNOSIS — I059 Rheumatic mitral valve disease, unspecified: Secondary | ICD-10-CM | POA: Diagnosis present

## 2014-02-22 DIAGNOSIS — R1310 Dysphagia, unspecified: Secondary | ICD-10-CM | POA: Diagnosis present

## 2014-02-22 LAB — CBC WITH DIFFERENTIAL/PLATELET
Basophils Absolute: 0 10*3/uL (ref 0.0–0.1)
Basophils Relative: 1 % (ref 0–1)
Eosinophils Absolute: 0.1 10*3/uL (ref 0.0–0.7)
Eosinophils Relative: 2 % (ref 0–5)
HCT: 39 % (ref 36.0–46.0)
Hemoglobin: 12.5 g/dL (ref 12.0–15.0)
Lymphocytes Relative: 13 % (ref 12–46)
Lymphs Abs: 0.7 10*3/uL (ref 0.7–4.0)
MCH: 31.1 pg (ref 26.0–34.0)
MCHC: 32.1 g/dL (ref 30.0–36.0)
MCV: 97 fL (ref 78.0–100.0)
Monocytes Absolute: 0.4 10*3/uL (ref 0.1–1.0)
Monocytes Relative: 8 % (ref 3–12)
Neutro Abs: 4 10*3/uL (ref 1.7–7.7)
Neutrophils Relative %: 76 % (ref 43–77)
Platelets: 205 10*3/uL (ref 150–400)
RBC: 4.02 MIL/uL (ref 3.87–5.11)
RDW: 14.5 % (ref 11.5–15.5)
WBC: 5.3 10*3/uL (ref 4.0–10.5)

## 2014-02-22 LAB — COMPREHENSIVE METABOLIC PANEL
ALT: 202 U/L — ABNORMAL HIGH (ref 0–35)
AST: 315 U/L — ABNORMAL HIGH (ref 0–37)
Albumin: 2.9 g/dL — ABNORMAL LOW (ref 3.5–5.2)
Alkaline Phosphatase: 282 U/L — ABNORMAL HIGH (ref 39–117)
Anion gap: 19 — ABNORMAL HIGH (ref 5–15)
BUN: 23 mg/dL (ref 6–23)
CO2: 21 mEq/L (ref 19–32)
Calcium: 8.6 mg/dL (ref 8.4–10.5)
Chloride: 99 mEq/L (ref 96–112)
Creatinine, Ser: 1.55 mg/dL — ABNORMAL HIGH (ref 0.50–1.10)
GFR calc Af Amer: 39 mL/min — ABNORMAL LOW (ref >90)
GFR calc non Af Amer: 34 mL/min — ABNORMAL LOW (ref >90)
Glucose, Bld: 201 mg/dL — ABNORMAL HIGH (ref 70–99)
Potassium: 4 mEq/L (ref 3.7–5.3)
Sodium: 139 mEq/L (ref 137–147)
Total Bilirubin: 1.9 mg/dL — ABNORMAL HIGH (ref 0.3–1.2)
Total Protein: 7 g/dL (ref 6.0–8.3)

## 2014-02-22 LAB — URINALYSIS, ROUTINE W REFLEX MICROSCOPIC
Bilirubin Urine: NEGATIVE
Glucose, UA: NEGATIVE mg/dL
Hgb urine dipstick: NEGATIVE
Ketones, ur: NEGATIVE mg/dL
Leukocytes, UA: NEGATIVE
Nitrite: NEGATIVE
Protein, ur: NEGATIVE mg/dL
Specific Gravity, Urine: 1.01 (ref 1.005–1.030)
Urobilinogen, UA: 1 mg/dL (ref 0.0–1.0)
pH: 6.5 (ref 5.0–8.0)

## 2014-02-22 LAB — RAPID URINE DRUG SCREEN, HOSP PERFORMED
Amphetamines: NOT DETECTED
Barbiturates: NOT DETECTED
Benzodiazepines: NOT DETECTED
Cocaine: NOT DETECTED
Opiates: POSITIVE — AB
Tetrahydrocannabinol: NOT DETECTED

## 2014-02-22 LAB — CBG MONITORING, ED: Glucose-Capillary: 187 mg/dL — ABNORMAL HIGH (ref 70–99)

## 2014-02-22 LAB — LIPASE, BLOOD: Lipase: 19 U/L (ref 11–59)

## 2014-02-22 LAB — MAGNESIUM: Magnesium: 1.9 mg/dL (ref 1.5–2.5)

## 2014-02-22 LAB — I-STAT TROPONIN, ED: Troponin i, poc: 0.01 ng/mL (ref 0.00–0.08)

## 2014-02-22 LAB — GLUCOSE, CAPILLARY: Glucose-Capillary: 142 mg/dL — ABNORMAL HIGH (ref 70–99)

## 2014-02-22 LAB — CK: Total CK: 243 U/L — ABNORMAL HIGH (ref 7–177)

## 2014-02-22 LAB — I-STAT CG4 LACTIC ACID, ED
Lactic Acid, Venous: 5.54 mmol/L — ABNORMAL HIGH (ref 0.5–2.2)
Lactic Acid, Venous: 2.5 mmol/L — ABNORMAL HIGH (ref 0.5–2.2)

## 2014-02-22 MED ORDER — VORTIOXETINE HBR 10 MG PO TABS
10.0000 mg | ORAL_TABLET | ORAL | Status: DC
  Administered 2014-02-23 – 2014-03-01 (×7): 10 mg via ORAL
  Filled 2014-02-22 (×9): qty 1

## 2014-02-22 MED ORDER — LORAZEPAM 1 MG PO TABS
1.0000 mg | ORAL_TABLET | Freq: Four times a day (QID) | ORAL | Status: DC | PRN
  Administered 2014-02-23 – 2014-02-28 (×9): 1 mg via ORAL
  Filled 2014-02-22 (×9): qty 1

## 2014-02-22 MED ORDER — SODIUM CHLORIDE 0.9 % IV SOLN
INTRAVENOUS | Status: DC
  Administered 2014-02-22 – 2014-02-24 (×3): via INTRAVENOUS

## 2014-02-22 MED ORDER — TETRAHYDROZOLINE HCL 0.05 % OP SOLN
2.0000 [drp] | Freq: Four times a day (QID) | OPHTHALMIC | Status: DC
  Administered 2014-02-22 – 2014-03-01 (×23): 2 [drp] via OPHTHALMIC
  Filled 2014-02-22 (×2): qty 15

## 2014-02-22 MED ORDER — OLANZAPINE-FLUOXETINE HCL 6-25 MG PO CAPS
1.0000 | ORAL_CAPSULE | Freq: Every day | ORAL | Status: DC
  Administered 2014-02-22 – 2014-02-28 (×7): 1 via ORAL
  Filled 2014-02-22 (×8): qty 1

## 2014-02-22 MED ORDER — GABAPENTIN 300 MG PO CAPS
300.0000 mg | ORAL_CAPSULE | Freq: Two times a day (BID) | ORAL | Status: DC | PRN
  Filled 2014-02-22: qty 1

## 2014-02-22 MED ORDER — PROMETHAZINE HCL 25 MG/ML IJ SOLN
6.2500 mg | Freq: Four times a day (QID) | INTRAMUSCULAR | Status: DC | PRN
  Administered 2014-02-23 – 2014-02-24 (×5): 6.25 mg via INTRAVENOUS
  Filled 2014-02-22 (×6): qty 1

## 2014-02-22 MED ORDER — IOHEXOL 300 MG/ML  SOLN
25.0000 mL | Freq: Once | INTRAMUSCULAR | Status: AC | PRN
  Administered 2014-02-22: 25 mL via ORAL

## 2014-02-22 MED ORDER — MORPHINE SULFATE 2 MG/ML IJ SOLN
1.0000 mg | INTRAMUSCULAR | Status: DC | PRN
  Administered 2014-02-23 – 2014-02-24 (×5): 1 mg via INTRAVENOUS
  Filled 2014-02-22 (×6): qty 1

## 2014-02-22 MED ORDER — VANCOMYCIN HCL 10 G IV SOLR
2500.0000 mg | Freq: Once | INTRAVENOUS | Status: AC
  Administered 2014-02-22: 2500 mg via INTRAVENOUS
  Filled 2014-02-22: qty 2500

## 2014-02-22 MED ORDER — SODIUM CHLORIDE 0.9 % IV SOLN
INTRAVENOUS | Status: DC
  Administered 2014-02-22: 150 mL/h via INTRAVENOUS
  Administered 2014-02-25: 21:00:00 via INTRAVENOUS

## 2014-02-22 MED ORDER — IOHEXOL 300 MG/ML  SOLN
60.0000 mL | Freq: Once | INTRAMUSCULAR | Status: AC | PRN
  Administered 2014-02-22: 60 mL via INTRAVENOUS

## 2014-02-22 MED ORDER — SODIUM CHLORIDE 0.9 % IV SOLN: INTRAVENOUS | Status: AC

## 2014-02-22 MED ORDER — SODIUM CHLORIDE 0.9 % IV BOLUS (SEPSIS)
1000.0000 mL | Freq: Once | INTRAVENOUS | Status: AC
  Administered 2014-02-22: 1000 mL via INTRAVENOUS

## 2014-02-22 MED ORDER — DARIFENACIN HYDROBROMIDE ER 7.5 MG PO TB24
7.5000 mg | ORAL_TABLET | Freq: Every day | ORAL | Status: DC
  Administered 2014-02-22 – 2014-03-01 (×7): 7.5 mg via ORAL
  Filled 2014-02-22 (×8): qty 1

## 2014-02-22 MED ORDER — ENOXAPARIN SODIUM 60 MG/0.6ML ~~LOC~~ SOLN
55.0000 mg | SUBCUTANEOUS | Status: DC
  Administered 2014-02-22 – 2014-02-26 (×5): 55 mg via SUBCUTANEOUS
  Filled 2014-02-22 (×6): qty 0.6

## 2014-02-22 MED ORDER — VANCOMYCIN HCL 10 G IV SOLR
1500.0000 mg | INTRAVENOUS | Status: DC
  Filled 2014-02-22: qty 1500

## 2014-02-22 MED ORDER — DEXTROSE 5 % IV SOLN
1.0000 g | INTRAVENOUS | Status: DC
  Administered 2014-02-22: 1 g via INTRAVENOUS
  Filled 2014-02-22 (×2): qty 1

## 2014-02-22 MED ORDER — PANTOPRAZOLE SODIUM 40 MG IV SOLR
40.0000 mg | INTRAVENOUS | Status: DC
  Administered 2014-02-22 – 2014-02-24 (×3): 40 mg via INTRAVENOUS
  Filled 2014-02-22 (×4): qty 40

## 2014-02-22 NOTE — ED Notes (Signed)
Patient spit up. Admitting MD at the bedside. Patient awake and alert. Patient placed on clear liquid diet.

## 2014-02-22 NOTE — ED Notes (Signed)
X-Ray at the bedside 

## 2014-02-22 NOTE — ED Notes (Signed)
Patient ordered a Public affairs consultant. Patient made aware and verbalized understanding.

## 2014-02-22 NOTE — ED Notes (Signed)
Dr.Ward shown results of Lactic Acid. ED-Lab.

## 2014-02-22 NOTE — H&P (Signed)
[facility] History and Physical  [name] FTD:[id number] DOB: Dec 10, 1945 DOA: 02/22/2014  Referring physician: EDP PCP: [name], MD   Chief Complaint: fall, nausea, vomiting and hypotension.  HPI: [name] is a 68 y.o. female with prior h/o hypertension, DM , MCP, anxiety and depression, hydrocephalous with VP shunt lives by her self comes in for the third time similar complaints, starts with  Nausea and vomiting for a few days, feels dizzy and falls. On arrival to ED,she was found to be hypotensive with bp 70/50's. She was given fluid boluses and her BP improved to 110/60's. Her symptoms improved slightly, but her HR remained in low 50's. She denies any fever or chills. She denies coughing. She denies any abdominal pain. She reports persistent migraine headaches since many weeks. She was recently admitted for similar complaints and discharged on 7/17. She reports falling two days ago and last night. She denies any back pain or hip pain, but reports her body feels sore. She denies having diarrhea. She denies any blurry vision, or any focal weakness. On arrival ED, her labs revealed elevated lactic acid and elevated liver function tests. Her CT abdomen and pelvis reveals hepatic steatosis. She was also found to be in acute renal failure. She was referred to [facility] service for admission for the above.    Review of Systems:  See hpi other wise negative.   Past Medical History  Diagnosis Date  . Depression   . Anxiety   . Hypertension   . Diabetes mellitus without complication   . MVP (mitral valve prolapse)   . Hydrocephalus    Past Surgical History  Procedure Laterality Date  . Cholecystectomy    . Brain surgery     Social History:  reports that she has never smoked. She has never used smokeless tobacco. She reports that she does not drink alcohol or use illicit drugs.  Allergies  Allergen Reactions  . Metformin And Related     diarrhea  . Ace Inhibitors    . Amitriptyline Hcl Other (See Comments)    unknown  . Erythromycin Hives  . Penicillins Hives    Family History  Problem Relation Age of Onset  . Hypertension Mother   . Heart disease Mother   . Stroke Mother   . Alcohol abuse Father   . Diabetes Father   . Cancer Neg Hx      Prior to Admission medications   Medication Sig Start Date End Date Taking? Authorizing Provider  gabapentin (NEURONTIN) 300 MG capsule Take 300 mg by mouth 2 (two) times daily as needed (pain).   Yes Historical Provider, MD  HYDROcodone-acetaminophen (NORCO/VICODIN) 5-325 MG per tablet Take 1 tablet by mouth every 8 (eight) hours as needed. 02/20/14  Yes Historical Provider, MD  ibuprofen (ADVIL,MOTRIN) 200 MG tablet Take 400 mg by mouth every 6 (six) hours as needed for headache.   Yes Historical Provider, MD  LORazepam (ATIVAN) 1 MG tablet Take 1 mg by mouth every 6 (six) hours as needed for anxiety. Take up to 4 times qd   Yes Historical Provider, MD  losartan (COZAAR) 100 MG tablet Take 100 mg by mouth daily.   Yes Historical Provider, MD  metFORMIN (GLUCOPHAGE) 500 MG tablet Take 500 mg by mouth daily with breakfast.   Yes Historical Provider, MD  OLANZapine-FLUoxetine (SYMBYAX) 6-25 MG per capsule Take 1 capsule by mouth at bedtime.   Yes Historical Provider, MD  ranitidine (ZANTAC) 150 MG tablet Take 150 mg  by mouth 3 (three) times daily as needed for heartburn.   Yes Historical Provider, MD  solifenacin (VESICARE) 10 MG tablet Take 10 mg by mouth daily.   Yes Historical Provider, MD  Tetrahydrozoline HCl (VISINE OP) Place 2 drops into both eyes 4 (four) times daily as needed (dry eyes).   Yes Historical Provider, MD  tiZANidine (ZANAFLEX) 4 MG tablet Take 4 mg by mouth 2 (two) times daily as needed for muscle spasms.   Yes Historical Provider, MD  traMADol (ULTRAM) 50 MG tablet Take 1 tablet (50 mg total) by mouth every 6 (six) hours as needed for moderate pain. 02/15/14  Yes Geradine Girt, DO  Vitamin  D, Ergocalciferol, (DRISDOL) 50000 UNITS CAPS capsule Take 50,000 Units by mouth once a week. On Monday   Yes Historical Provider, MD  Vortioxetine HBr (BRINTELLIX) 10 MG TABS Take 10 mg by mouth every morning.    Yes Historical Provider, MD   Physical Exam: Filed Vitals:   02/22/14 1730 02/22/14 1745 02/22/14 1800 02/22/14 1820  BP: 110/59 124/63  92/53  Pulse: 58 40 57 54  Temp:      TempSrc:      Resp: 22 12 19 15   Height:      Weight:      SpO2: 95% 97% 97% 95%    Wt Readings from Last 3 Encounters:  02/22/14 111.5 kg (245 lb 13 oz)  02/14/14 111.5 kg (245 lb 13 oz)  12/27/13 119.296 kg (263 lb)    General:  Appears calm and comfortable Eyes: PERRL, normal lids, irises & conjunctiva Neck: no LAD, masses or thyromegaly Cardiovascular: RRR, no m/r/g. No LE edema.  Respiratory: CTA bilaterally, no w/r/r. Normal respiratory effort. Abdomen: soft, ntnd Skin: no rash or induration seen on limited exam Musculoskeletal: bruise over the lleft lateral part of the thigh Neurologic: grossly non-focal.          Labs on Admission:  Basic Metabolic Panel:  Recent Labs Lab 02/22/14 1320  NA 139  K 4.0  CL 99  CO2 21  GLUCOSE 201*  BUN 23  CREATININE 1.55*  CALCIUM 8.6   Liver Function Tests:  Recent Labs Lab 02/22/14 1320  AST 315*  ALT 202*  ALKPHOS 282*  BILITOT 1.9*  PROT 7.0  ALBUMIN 2.9*    Recent Labs Lab 02/22/14 1320  LIPASE 19   No results found for this basename: AMMONIA,  in the last 168 hours CBC:  Recent Labs Lab 02/22/14 1320  WBC 5.3  NEUTROABS 4.0  HGB 12.5  HCT 39.0  MCV 97.0  PLT 205   Cardiac Enzymes: No results found for this basename: CKTOTAL, CKMB, CKMBINDEX, TROPONINI,  in the last 168 hours  BNP (last 3 results) No results found for this basename: PROBNP,  in the last 8760 hours CBG:  Recent Labs Lab 02/22/14 1314  GLUCAP 187*    Radiological Exams on Admission: Ct Head Wo Contrast  02/22/2014   CLINICAL DATA:   Fall  EXAM: CT HEAD WITHOUT CONTRAST  TECHNIQUE: Contiguous axial images were obtained from the base of the skull through the vertex without intravenous contrast.  COMPARISON:  MRI brain 02/13/2014  FINDINGS: There is no evidence of mass effect, midline shift or extra-axial fluid collections. There is no evidence of a space-occupying lesion or intracranial hemorrhage. There is no evidence of a cortical-based area of acute infarction. There is periventricular white matter low attenuation likely secondary to microangiopathy.  There is a ventriculoperitoneal shunt catheter  via a right parietal approach with the tip in unchanged position. The ventricles and sulci are appropriate for the patient's age. The basal cisterns are patent.  Visualized portions of the orbits are unremarkable. The visualized portions of the paranasal sinuses and mastoid air cells are unremarkable.  The osseous structures are unremarkable.  IMPRESSION: 1. No acute intracranial pathology. 2. Stable position of the ventriculoperitoneal shunt catheter.   Electronically Signed   By: Kathreen Devoid   On: 02/22/2014 15:42   Ct Abdomen Pelvis W Contrast  02/22/2014   CLINICAL DATA:  Fall  EXAM: CT ABDOMEN AND PELVIS WITH CONTRAST  TECHNIQUE: Multidetector CT imaging of the abdomen and pelvis was performed using the standard protocol following bolus administration of intravenous contrast.  CONTRAST:  19mL OMNIPAQUE IOHEXOL 300 MG/ML  SOLN  COMPARISON:  02/14/2014  FINDINGS: Mild bibasilar atelectasis. Postcholecystectomy Diffuse hepatic steatosis. Postcholecystectomy Borderline splenomegaly with a maximal length of 14.2 cm. Kidneys, pancreas, and adrenal glands are within normal limits. Ventral abdominal hernia containing adipose tissue is stable VP shunt catheter traverses into the peritoneal space. It is coiled in the pelvis. Normal appendix. Bladder is distended. Uterus and adnexa are unremarkable. No vertebral compression deformity. Degenerative  change with spinal stenosis at L4-5.  IMPRESSION: No acute abdominal injury.  Borderline splenomegaly.  Bibasilar atelectasis.  Stable chronic changes.   Electronically Signed   By: Maryclare Bean M.D.   On: 02/22/2014 15:45   Dg Chest Port 1 View  02/22/2014   CLINICAL DATA:  Fall.  EXAM: PORTABLE CHEST - 1 VIEW  COMPARISON:  07/03/2010.  FINDINGS: Ventriculoperitoneal shunt tubing noted over the right chest. Mediastinum and hilar structures normal. Cardiomegaly with normal pulmonary vascularity. Low lung volumes. Mild chronic interstitial lung disease. No acute pulmonary infiltrate. No pleural effusion or pneumothorax. No acute osseous abnormality .  IMPRESSION: 1. Mild cardiomegaly.  No CHF. 2. Chronic interstitial lung disease. No acute pulmonary infiltrate. 3. Ventriculoperitoneal shunt noted.   Electronically Signed   By: Marcello Moores  Register   On: 02/22/2014 13:31    EKG: SINUS BRADYCARDIA WITH PROLONGED QT interval.   Assessment/Plan Active Problems:   HYDROCEPHALUS, NORMAL PRESSURE   AKI (acute kidney injury)   Hypotension   Fall   Nausea & vomiting   Transaminitis   Nausea and vomiting   1. Persistent nausea, vomiting: - unclear etiology.  - ? Delayed gastric emptying from DM and narcotic use. Symptoms started 3 weeks ago.  Symptomatic management with IV fluds, IV anti emetics. Gastric emptying study ordered.  Clear liquid diet and advance as tolerated. IV protonix for PPI.  In view of this being her third visit for similar complaints, would benefit from Gastroenterology consult. Please call in am.   2. Dizziness and fall: Probably secondary to dehydration, orthostatic hypotension. BP parameters improved with hydration. Her symptoms of dizziness also improved. orthostatic vital signs in am and physical therapy evaluation.   3. Hypotension: resolved with fluids  4. Acute kidney injury: probably secondary to dehydration Hydration and repeat renal parameters in am.    5. Elevated  liver function tests: - with CT showing evidence of hepatic steatosis.  - could this be hepatitis with symptoms of nausea and vomiting.  - repeat hepatic function tests in am.  6. Elevated lactic acid levels: - probably secondary to dehydration. She denies any abdominal pain or diarrhea.  - rpt levels.   7. Hydrocephalous and h/o v/p shunt:  - no new headaches at this time.   8. Bradycardia  with prolong qt interval.  - admit to tele.  EKg shows sinus bradycardia. Repeat EKG on arrival to the floor.  Not on any bb   DVT prophylaxis.   Code Status: full code.  DVT Prophylaxis: lovenox Family Communication: none at bedside Disposition Plan: admit to tele.   Time spent: 65 minutes.   [facility] Pager [phone]  **Disclaimer: This note may have been dictated with voice recognition software. Similar sounding words can inadvertently be transcribed and this note may contain transcription errors which may not have been corrected upon publication of note.**

## 2014-02-22 NOTE — ED Notes (Signed)
CT notified pt finished contrast  

## 2014-02-22 NOTE — ED Notes (Signed)
Discussed drop in pt's GFR with Doctor.  Per Policy pt does qualify for a reduced dose of IV contrast.  Dr. Leonides Schanz does want pt to receive IV contrast.

## 2014-02-22 NOTE — ED Notes (Signed)
Admitting MD at the bedside.  

## 2014-02-22 NOTE — ED Notes (Signed)
Per EMS, Patient fell this morning about 0930. Patient was able to get herself up in and sit in a chair to call 911. Patient was found by EMS sitting in the chair diaphoretic unable to get a palpable blood pressure. Patient was in the hospital and was discharged last Friday. Patient does not remember why she was taken to the hospital. Patient simply remembers waking up in the hospital. Patient is Alert and Oriented x4. Vitals per EMS: 96/70, 60 HR, 20 RR, 94 % on 2L [place], 242 CBG.

## 2014-02-22 NOTE — ED Notes (Signed)
Patient made aware we need a urine. Unable to urinate at this time.

## 2014-02-22 NOTE — ED Notes (Signed)
Patient returned from CT

## 2014-02-22 NOTE — Progress Notes (Signed)
ANTIBIOTIC CONSULT NOTE - INITIAL  Pharmacy Consult for Cefepime, Vancomycin Indication: Sepsis   Allergies  Allergen Reactions  . Metformin And Related     diarrhea  . Ace Inhibitors   . Amitriptyline Hcl Other (See Comments)    unknown  . Erythromycin Hives  . Penicillins Hives    Patient Measurements: Height: 5' 4.96" (165 cm) Weight: 245 lb 13 oz (111.5 kg) IBW/kg (Calculated) : 56.91 Adjusted Body Weight: n/a   Vital Signs: Temp: 97.6 F (36.4 C) (07/24 1335) Temp src: Rectal (07/24 1335) BP: 99/51 mmHg (07/24 1345) Pulse Rate: 55 (07/24 1345) Intake/Output from previous day:   Intake/Output from this shift:    Labs:  Recent Labs  02/22/14 1320  WBC 5.3  HGB 12.5  PLT 205  CREATININE 1.55*   Estimated Creatinine Clearance: 43.8 ml/min (by C-G formula based on Cr of 1.55). No results found for this basename: VANCOTROUGH, Corlis Leak, VANCORANDOM, GENTTROUGH, GENTPEAK, GENTRANDOM, TOBRATROUGH, TOBRAPEAK, TOBRARND, AMIKACINPEAK, AMIKACINTROU, AMIKACIN,  in the last 72 hours   Microbiology: Recent Results (from the past 720 hour(s))  URINE CULTURE     Status: None   Collection Time    02/13/14  5:06 PM      Result Value Ref Range Status   Specimen Description URINE, RANDOM   Final   Special Requests NONE   Final   Culture  Setup Time     Final   Value: 02/13/2014 18:27     Performed at [facility]     Final   Value: 20,OOO COLONIES/ML     Performed at [facility]   Culture     Final   Value: Multiple bacterial morphotypes present, none predominant. Suggest appropriate recollection if clinically indicated.     Performed at [facility]   Report Status 02/14/2014 FINAL   Final    Medical History: Past Medical History  Diagnosis Date  . Depression   . Anxiety   . Hypertension   . Diabetes mellitus without complication   . MVP (mitral valve prolapse)   . Hydrocephalus     Medications:   (Not in a  hospital admission) Assessment: 29 YOF with N/V brought to the ED after a fall. Pharmacy consulted to start empiric antibiotic therapy for r/o sepsis. She is afebrile with normal white counts.  LA is elevated at 5.54. CXR negative for infiltrates. SCr is elevated at 1.55. CrCl 40- 45 mL/min.   Cultures: 7/24 Blood Cx x2>>  7/24 Urine Cx >>   Goal of Therapy:  Vancomycin trough level 15-20 mcg/ml  Plan:  -Give Vancomycin loading dose of 2500 mg x 1, followed by 1500 mg IV Q 24 hours  -Start Cefepime 1 gm IV Q 24 hours -Monitor CBC, renal fx, cultures and patient's clinical progress -Collect Vanc trough at steady state   Albertina Parr, PharmD.  Clinical Pharmacist Pager [phone]

## 2014-02-22 NOTE — ED Notes (Signed)
MD made aware of the patient's neuro assessment immediately. MD at the bedside at 1250. Md cleared patient and stated symptoms were due to low BP. Patient placed on 2L after oxygen was 88 % on RA. Patient lying flat in bed and patient made comfortable. Patient made aware of the plan of care.

## 2014-02-22 NOTE — ED Notes (Signed)
Patient being taken upstairs by Jackelyn Poling, NT.

## 2014-02-22 NOTE — ED Provider Notes (Signed)
TIME SEEN: 12:40 PM  CHIEF COMPLAINT: Vomiting, hypotension  HPI: Patient is a 69 y.o. F with history of hypertension, diabetes, hydrocephalus with VP shunt she had a fall at home today out of her chair. She states she was able to get off the floor and called 911. EMS noted the patient was hypotensive. She states that she was recently admitted to the hospital for similar symptoms. At that time she been vomiting for several days. She states upon discharge she is feeling much better and then began vomiting 2 days after discharge. No diarrhea. She denies any headache, new neck pain or neck stiffness, chest pain or shortness of breath, diarrhea, bloody stool or melena. No numbness, tingling or focal weakness. Patient states she is on antihypertensives and took her medication this morning.  ROS: See HPI Constitutional: no fever  Eyes: no drainage  ENT: no runny nose   Cardiovascular:  no chest pain  Resp: no SOB  GI:  vomiting GU: no dysuria Integumentary: no rash  Allergy: no hives  Musculoskeletal: no leg swelling  Neurological: no slurred speech ROS otherwise negative  PAST MEDICAL HISTORY/PAST SURGICAL HISTORY:  Past Medical History  Diagnosis Date  . Depression   . Anxiety   . Hypertension   . Diabetes mellitus without complication   . MVP (mitral valve prolapse)   . Hydrocephalus     MEDICATIONS:  Prior to Admission medications   Medication Sig Start Date End Date Taking? Authorizing Provider  Cholecalciferol 50000 UNITS TABS Take 1 tablet by mouth once a week. 12/28/13   Janith Lima, MD  gabapentin (NEURONTIN) 300 MG capsule Take 300 mg by mouth 2 (two) times daily as needed (pain).    Historical Provider, MD  ibuprofen (ADVIL,MOTRIN) 200 MG tablet Take 400 mg by mouth every 6 (six) hours as needed for headache.    Historical Provider, MD  LORazepam (ATIVAN) 1 MG tablet Take 1 mg by mouth every 6 (six) hours as needed for anxiety. Take up to 4 times qd    Historical Provider,  MD  losartan (COZAAR) 100 MG tablet Take 100 mg by mouth daily.    Historical Provider, MD  metFORMIN (GLUCOPHAGE) 500 MG tablet Take 500 mg by mouth daily with breakfast.    Historical Provider, MD  OLANZapine-FLUoxetine (SYMBYAX) 6-25 MG per capsule Take 1 capsule by mouth at bedtime.    Historical Provider, MD  ranitidine (ZANTAC) 150 MG tablet Take 150 mg by mouth 3 (three) times daily as needed for heartburn.    Historical Provider, MD  solifenacin (VESICARE) 10 MG tablet Take 10 mg by mouth daily.    Historical Provider, MD  Tetrahydrozoline HCl (VISINE OP) Place 2 drops into both eyes 4 (four) times daily as needed (dry eyes).    Historical Provider, MD  tiZANidine (ZANAFLEX) 4 MG tablet Take 4 mg by mouth 2 (two) times daily as needed for muscle spasms.    Historical Provider, MD  traMADol (ULTRAM) 50 MG tablet Take 1 tablet (50 mg total) by mouth every 6 (six) hours as needed for moderate pain. 02/15/14   Geradine Girt, DO  Vortioxetine HBr (BRINTELLIX) 10 MG TABS Take 10 mg by mouth every morning.     Historical Provider, MD    ALLERGIES:  Allergies  Allergen Reactions  . Metformin And Related     diarrhea  . Ace Inhibitors   . Amitriptyline Hcl Other (See Comments)    unknown  . Erythromycin Hives  . Penicillins Hives  SOCIAL HISTORY:  History  Substance Use Topics  . Smoking status: Never Smoker   . Smokeless tobacco: Never Used  . Alcohol Use: No    FAMILY HISTORY: Family History  Problem Relation Age of Onset  . Hypertension Mother   . Heart disease Mother   . Stroke Mother   . Alcohol abuse Father   . Diabetes Father   . Cancer Neg Hx     EXAM: BP 95/50  Pulse 57  Temp(Src) 98.2 F (36.8 C) (Oral)  Resp 17  SpO2 92% CONSTITUTIONAL: Alert and oriented x3 and responds appropriately to questions. Well-appearing; well-nourished HEAD: Normocephalic EYES: Conjunctivae clear, PERRL ENT: normal nose; no rhinorrhea; dry mucous membranes; pharynx without  lesions noted NECK: Supple, no meningismus, no LAD  CARD: RRR; S1 and S2 appreciated; no murmurs, no clicks, no rubs, no gallops RESP: Normal chest excursion without splinting or tachypnea; breath sounds clear and equal bilaterally; no wheezes, no rhonchi, no rales,  ABD/GI: Normal bowel sounds; non-distended; soft, non-tender, no rebound, no guarding, obese, no peritoneal signs BACK:  The back appears normal and is non-tender to palpation, there is no CVA tenderness EXT: Normal ROM in all joints; non-tender to palpation; no edema; normal capillary refill; no cyanosis    SKIN: Normal color for age and race; warm NEURO: Moves all extremities equally, sensation to light touch intact diffusely, cranial nerves II through XII intact PSYCH: The patient's mood and manner are appropriate. Grooming and personal hygiene are appropriate.  MEDICAL DECISION MAKING: Patient here with hypotension. May be secondary to hypovolemia from vomiting. Abdominal exam is benign. No other infectious symptoms. No chest pain or shortness of breath. We'll obtain labs to evaluate for anemia, ACS. We'll obtain urine and chest x-ray to evaluate for infection. We'll also obtain head CT given her fall and head injury. Blood pressure improving with IV fluids. We'll continue hydration.  ED PROGRESS: Blood pressure continues to improve with IV fluids. She does have a lactate of 5.54.  Given her vomiting and significantly elevated lactate, will obtain a CT of her abdomen and pelvis given her abdominal exam is completely benign and she is not complaining of abdominal pain or nausea currently. Other labs pending.  Given her elevated lactate and hypotension, have ordered broad-spectrum antibiotics for possible sepsis.   Troponin negative. No leukocytosis. She does have a mild elevated creatinine of 1.55. There is elevation of her LFTs no right upper quadrant tenderness or epigastric pain on exam, negative Murphy sign. She states she is  status post cholecystectomy. Chest x-ray shows no infiltrate. Urine, CT head, CT of her abdomen and pelvis pending. Blood cultures have been ordered.    4:00 PM  Pt having episodes of bradycardia into the low 40s, but blood pressure is 120s/70s. She is asymptomatic with no chest pain. Her electrolytes are normal. Repeat EKG shows no interval changes with slightly prolonged QT interval. We'll continue to closely monitor. Pacer pads are currently on the patient. She is not on a beta blocker.   CT of her abdomen and head show no acute abnormality. Given she still has some intermittent soft blood pressures, will admit for continued hydration. We'll discuss with hospitalist for admission.   5:20 PM  Spoke with Dr. Karleen Hampshire for admission to tele, inpt.    CRITICAL CARE Performed by: Nyra Jabs   Total critical care time: 60 minutes  Critical care time was exclusive of separately billable procedures and treating other patients.  Critical care was necessary  to treat or prevent imminent or life-threatening deterioration.  Critical care was time spent personally by me on the following activities: development of treatment plan with patient and/or surrogate as well as nursing, discussions with consultants, evaluation of patient's response to treatment, examination of patient, obtaining history from patient or surrogate, ordering and performing treatments and interventions, ordering and review of laboratory studies, ordering and review of radiographic studies, pulse oximetry and re-evaluation of patient's condition.     EKG Interpretation  Date/Time:  Friday February 22 2014 13:06:07 EDT Ventricular Rate:  57 PR Interval:  204 QRS Duration: 106 QT Interval:  522 QTC Calculation: 508 R Axis:   -10 Text Interpretation:  Sinus rhythm Atrial premature complex Abnormal R-wave progression, early transition Left ventricular hypertrophy Prolonged QT interval No significant change since last tracing  Confirmed by WARD,  DO, KRISTEN [phone]) on 02/22/2014 1:10:25 PM        [place], DO 02/22/14 1720

## 2014-02-22 NOTE — ED Notes (Signed)
Patient's heart rate noted to be 44bpm. Pt denies any symptoms. Dr. Leonides Schanz made aware, states to place patient on Zoll/pads. Pt placed on pads at this time.

## 2014-02-22 NOTE — ED Notes (Signed)
Patient finished drinking contrast. Ct notified.

## 2014-02-23 LAB — COMPREHENSIVE METABOLIC PANEL
ALT: 218 U/L — ABNORMAL HIGH (ref 0–35)
AST: 346 U/L — ABNORMAL HIGH (ref 0–37)
Albumin: 2.9 g/dL — ABNORMAL LOW (ref 3.5–5.2)
Alkaline Phosphatase: 258 U/L — ABNORMAL HIGH (ref 39–117)
Anion gap: 14 (ref 5–15)
BUN: 18 mg/dL (ref 6–23)
CO2: 25 mEq/L (ref 19–32)
Calcium: 8.7 mg/dL (ref 8.4–10.5)
Chloride: 104 mEq/L (ref 96–112)
Creatinine, Ser: 1.13 mg/dL — ABNORMAL HIGH (ref 0.50–1.10)
GFR calc Af Amer: 57 mL/min — ABNORMAL LOW (ref >90)
GFR calc non Af Amer: 49 mL/min — ABNORMAL LOW (ref >90)
Glucose, Bld: 130 mg/dL — ABNORMAL HIGH (ref 70–99)
Potassium: 4.2 mEq/L (ref 3.7–5.3)
Sodium: 143 mEq/L (ref 137–147)
Total Bilirubin: 1.6 mg/dL — ABNORMAL HIGH (ref 0.3–1.2)
Total Protein: 6.6 g/dL (ref 6.0–8.3)

## 2014-02-23 LAB — URINE CULTURE: Colony Count: 75000

## 2014-02-23 LAB — CBC
HCT: 39.1 % (ref 36.0–46.0)
Hemoglobin: 12.5 g/dL (ref 12.0–15.0)
MCH: 30.6 pg (ref 26.0–34.0)
MCHC: 32 g/dL (ref 30.0–36.0)
MCV: 95.8 fL (ref 78.0–100.0)
Platelets: 211 10*3/uL (ref 150–400)
RBC: 4.08 MIL/uL (ref 3.87–5.11)
RDW: 14.8 % (ref 11.5–15.5)
WBC: 5.5 10*3/uL (ref 4.0–10.5)

## 2014-02-23 LAB — GLUCOSE, CAPILLARY
Glucose-Capillary: 167 mg/dL — ABNORMAL HIGH (ref 70–99)
Glucose-Capillary: 167 mg/dL — ABNORMAL HIGH (ref 70–99)
Glucose-Capillary: 130 mg/dL — ABNORMAL HIGH (ref 70–99)

## 2014-02-23 NOTE — Consult Note (Signed)
Consult for [facility].  Reason for Consult: Nausea/Vomiting and abnormal liver enzymes. Referring Physician: [facility].  [name] HPI: This is a 68 year old female with a PMH of hydrocephalus s/p VP shunt, s/p cholecystectomy for gallstones and acute cholecystitis, HTN, and DM who was admitted to the hospital with complaints of nausea and vomiting.  In fact, this is a repeat admission for her complaints.  She denies any issues with her VP shunt and her DM is under relatively good control with an Hemoglobin A1C in the 6% range.  No complaints of any abdominal pain, but she does reports starting some new medications of late.  Her liver enzymes were normal in 08/2013, but they now demonstrate an obstructive pattern since the middle of July.  Her liver enzyme elevations correlate with the onset of her symptoms.  The ultrasound and CT scan both reveal a dilated CBD, but no evidence of calcified stones on the CT scan.    Past Medical History  Diagnosis Date  . Depression   . Anxiety   . Hypertension   . Diabetes mellitus without complication   . MVP (mitral valve prolapse)   . Hydrocephalus     Past Surgical History  Procedure Laterality Date  . Cholecystectomy    . Brain surgery      Family History  Problem Relation Age of Onset  . Hypertension Mother   . Heart disease Mother   . Stroke Mother   . Alcohol abuse Father   . Diabetes Father   . Cancer Neg Hx     Social History:  reports that she has never smoked. She has never used smokeless tobacco. She reports that she does not drink alcohol or use illicit drugs.  Allergies:  Allergies  Allergen Reactions  . Metformin And Related     diarrhea  . Ace Inhibitors   . Amitriptyline Hcl Other (See Comments)    unknown  . Erythromycin Hives  . Penicillins Hives    Medications:    Results for orders placed during the hospital encounter of 02/22/14 (from the past 24 hour(s))  URINALYSIS, ROUTINE W REFLEX  MICROSCOPIC     Status: None   Collection Time    02/22/14  2:54 PM      Result Value Ref Range   Color, Urine YELLOW  YELLOW   APPearance CLEAR  CLEAR   Specific Gravity, Urine 1.010  1.005 - 1.030   pH 6.5  5.0 - 8.0   Glucose, UA NEGATIVE  NEGATIVE mg/dL   Hgb urine dipstick NEGATIVE  NEGATIVE   Bilirubin Urine NEGATIVE  NEGATIVE   Ketones, ur NEGATIVE  NEGATIVE mg/dL   Protein, ur NEGATIVE  NEGATIVE mg/dL   Urobilinogen, UA 1.0  0.0 - 1.0 mg/dL   Nitrite NEGATIVE  NEGATIVE   Leukocytes, UA NEGATIVE  NEGATIVE  MAGNESIUM     Status: None   Collection Time    02/22/14  6:20 PM      Result Value Ref Range   Magnesium 1.9  1.5 - 2.5 mg/dL  I-STAT CG4 LACTIC ACID, ED     Status: Abnormal   Collection Time    02/22/14  6:34 PM      Result Value Ref Range   Lactic Acid, Venous 2.50 (*) 0.5 - 2.2 mmol/L  CK     Status: Abnormal   Collection Time    02/22/14  8:00 PM      Result Value Ref Range   Total CK  243 (*) 7 - 177 U/L  GLUCOSE, CAPILLARY     Status: Abnormal   Collection Time    02/22/14  8:47 PM      Result Value Ref Range   Glucose-Capillary 142 (*) 70 - 99 mg/dL  URINE RAPID DRUG SCREEN (HOSP PERFORMED)     Status: Abnormal   Collection Time    02/22/14  9:47 PM      Result Value Ref Range   Opiates POSITIVE (*) NONE DETECTED   Cocaine NONE DETECTED  NONE DETECTED   Benzodiazepines NONE DETECTED  NONE DETECTED   Amphetamines NONE DETECTED  NONE DETECTED   Tetrahydrocannabinol NONE DETECTED  NONE DETECTED   Barbiturates NONE DETECTED  NONE DETECTED  COMPREHENSIVE METABOLIC PANEL     Status: Abnormal   Collection Time    02/23/14  4:06 AM      Result Value Ref Range   Sodium 143  137 - 147 mEq/L   Potassium 4.2  3.7 - 5.3 mEq/L   Chloride 104  96 - 112 mEq/L   CO2 25  19 - 32 mEq/L   Glucose, Bld 130 (*) 70 - 99 mg/dL   BUN 18  6 - 23 mg/dL   Creatinine, Ser 1.13 (*) 0.50 - 1.10 mg/dL   Calcium 8.7  8.4 - 10.5 mg/dL   Total Protein 6.6  6.0 - 8.3 g/dL    Albumin 2.9 (*) 3.5 - 5.2 g/dL   AST 346 (*) 0 - 37 U/L   ALT 218 (*) 0 - 35 U/L   Alkaline Phosphatase 258 (*) 39 - 117 U/L   Total Bilirubin 1.6 (*) 0.3 - 1.2 mg/dL   GFR calc non Af Amer 49 (*) >90 mL/min   GFR calc Af Amer 57 (*) >90 mL/min   Anion gap 14  5 - 15  CBC     Status: None   Collection Time    02/23/14  4:06 AM      Result Value Ref Range   WBC 5.5  4.0 - 10.5 K/uL   RBC 4.08  3.87 - 5.11 MIL/uL   Hemoglobin 12.5  12.0 - 15.0 g/dL   HCT 39.1  36.0 - 46.0 %   MCV 95.8  78.0 - 100.0 fL   MCH 30.6  26.0 - 34.0 pg   MCHC 32.0  30.0 - 36.0 g/dL   RDW 14.8  11.5 - 15.5 %   Platelets 211  150 - 400 K/uL  GLUCOSE, CAPILLARY     Status: Abnormal   Collection Time    02/23/14  6:15 AM      Result Value Ref Range   Glucose-Capillary 167 (*) 70 - 99 mg/dL  GLUCOSE, CAPILLARY     Status: Abnormal   Collection Time    02/23/14 12:03 PM      Result Value Ref Range   Glucose-Capillary 167 (*) 70 - 99 mg/dL   Comment 1 Documented in Chart     Comment 2 Notify RN       Ct Head Wo Contrast  02/22/2014   CLINICAL DATA:  Fall  EXAM: CT HEAD WITHOUT CONTRAST  TECHNIQUE: Contiguous axial images were obtained from the base of the skull through the vertex without intravenous contrast.  COMPARISON:  MRI brain 02/13/2014  FINDINGS: There is no evidence of mass effect, midline shift or extra-axial fluid collections. There is no evidence of a space-occupying lesion or intracranial hemorrhage. There is no evidence of a cortical-based area of acute  infarction. There is periventricular white matter low attenuation likely secondary to microangiopathy.  There is a ventriculoperitoneal shunt catheter via a right parietal approach with the tip in unchanged position. The ventricles and sulci are appropriate for the patient's age. The basal cisterns are patent.  Visualized portions of the orbits are unremarkable. The visualized portions of the paranasal sinuses and mastoid air cells are unremarkable.   The osseous structures are unremarkable.  IMPRESSION: 1. No acute intracranial pathology. 2. Stable position of the ventriculoperitoneal shunt catheter.   Electronically Signed   By: Kathreen Devoid   On: 02/22/2014 15:42   Ct Abdomen Pelvis W Contrast  02/22/2014   CLINICAL DATA:  Fall  EXAM: CT ABDOMEN AND PELVIS WITH CONTRAST  TECHNIQUE: Multidetector CT imaging of the abdomen and pelvis was performed using the standard protocol following bolus administration of intravenous contrast.  CONTRAST:  93mL OMNIPAQUE IOHEXOL 300 MG/ML  SOLN  COMPARISON:  02/14/2014  FINDINGS: Mild bibasilar atelectasis. Postcholecystectomy Diffuse hepatic steatosis. Postcholecystectomy Borderline splenomegaly with a maximal length of 14.2 cm. Kidneys, pancreas, and adrenal glands are within normal limits. Ventral abdominal hernia containing adipose tissue is stable VP shunt catheter traverses into the peritoneal space. It is coiled in the pelvis. Normal appendix. Bladder is distended. Uterus and adnexa are unremarkable. No vertebral compression deformity. Degenerative change with spinal stenosis at L4-5.  IMPRESSION: No acute abdominal injury.  Borderline splenomegaly.  Bibasilar atelectasis.  Stable chronic changes.   Electronically Signed   By: Maryclare Bean M.D.   On: 02/22/2014 15:45   Dg Chest Port 1 View  02/22/2014   CLINICAL DATA:  Fall.  EXAM: PORTABLE CHEST - 1 VIEW  COMPARISON:  07/03/2010.  FINDINGS: Ventriculoperitoneal shunt tubing noted over the right chest. Mediastinum and hilar structures normal. Cardiomegaly with normal pulmonary vascularity. Low lung volumes. Mild chronic interstitial lung disease. No acute pulmonary infiltrate. No pleural effusion or pneumothorax. No acute osseous abnormality .  IMPRESSION: 1. Mild cardiomegaly.  No CHF. 2. Chronic interstitial lung disease. No acute pulmonary infiltrate. 3. Ventriculoperitoneal shunt noted.   Electronically Signed   By: Marcello Moores  Register   On: 02/22/2014 13:31     ROS:  As stated above in the HPI otherwise negative.  Blood pressure 144/53, pulse 69, temperature 98 F (36.7 C), temperature source Oral, resp. rate 18, height 5\' 5"  (1.651 m), weight 249 lb 5.4 oz (113.1 kg), SpO2 95.00%.    PE: Gen: NAD, Alert and Oriented HEENT:  [place]/AT, EOMI Neck: Supple, no LAD Lungs: CTA Bilaterally CV: RRR without M/G/R ABM: Soft, NTND, +BS Ext: No C/C/E  Assessment/Plan: 1) Abnormal liver enzymes - obstructive pattern. 2) Hydrocephalus - stable with VP shunt. 3) DM. 4) Obesity.   The patient does not exhibit any abdominal findings, but her nausea and vomiting along with the abnormal liver enzymes can be consistent with a biliary etiology.  It tends to occur with PO intake, but she denies any problems when she eats fruits.  I do not think she had diabetic gastroparesis, but it is worthwhile to pursue the gastric emptying scan if the MRCP is negative.  Plan: 1) MRCP.   Krystiana Fornes D 02/23/2014, 2:22 PM

## 2014-02-23 NOTE — Progress Notes (Signed)
Note: This document was prepared with digital dictation and possible smart phrase technology. Any transcriptional errors that result from this process are unintentional.   [name] XNA:[id number] DOB: 07/15/1946 DOA: 02/22/2014 PCP: [name], MD  Brief narrative: 68 y/o ?, recent admit 7/15-7/17 near syncope, known hydrocephalus s/p shunt [reprogrammed 7/15 admission], migraines, recent falls, Bipolar, htn., dm ty 2 admitted with hypotension, liver/renal failure and lactic acidosis  Past medical history-As per Problem list Chart reviewed as below- reviewed  Consultants:  [facility]  Procedures:  None yet  Antibiotics:  none   Subjective   Alert pleasant but nasuea ++.  Only able to keep down liquids.  Unable to eat. NO overt abd pain but reproducible and there is and discomfot on pressur eover epigast No food pain relationship No relation to diet consistency, nor odynophagia   Objective    Interim History: none  Telemetry: none   Objective: Filed Vitals:   02/22/14 1820 02/22/14 1926 02/22/14 1930 02/23/14 0457  BP: 92/53 141/58  144/53  Pulse: 54 50  69  Temp:  97.5 F (36.4 C)  98 F (36.7 C)  TempSrc:  Oral  Oral  Resp: 15 18  18   Height:   5\' 5"  (1.651 m)   Weight:   113.1 kg (249 lb 5.4 oz)   SpO2: 95%   95%    Intake/Output Summary (Last 24 hours) at 02/23/14 1104 Last data filed at 02/23/14 0800  Gross per 24 hour  Intake  757.5 ml  Output   1300 ml  Net -542.5 ml    Exam:  General: Alert pleasant oriented not in distress Cardiovascular: S1-S2 no murmur rub or gallop Respiratory: Clinically clear Abdomen: Soft but tender in the epigastric region Skin no lower extremity edema Neuro range of motion intact  Data Reviewed: Basic Metabolic Panel:  Recent Labs Lab 02/22/14 1320 02/22/14 1820 02/23/14 0406  NA 139  --  143  K 4.0  --  4.2  CL 99  --  104  CO2 21  --  25  GLUCOSE 201*  --  130*  BUN 23  --  18    CREATININE 1.55*  --  1.13*  CALCIUM 8.6  --  8.7  MG  --  1.9  --    Liver Function Tests:  Recent Labs Lab 02/22/14 1320 02/23/14 0406  AST 315* 346*  ALT 202* 218*  ALKPHOS 282* 258*  BILITOT 1.9* 1.6*  PROT 7.0 6.6  ALBUMIN 2.9* 2.9*    Recent Labs Lab 02/22/14 1320  LIPASE 19   No results found for this basename: AMMONIA,  in the last 168 hours CBC:  Recent Labs Lab 02/22/14 1320 02/23/14 0406  WBC 5.3 5.5  NEUTROABS 4.0  --   HGB 12.5 12.5  HCT 39.0 39.1  MCV 97.0 95.8  PLT 205 211   Cardiac Enzymes:  Recent Labs Lab 02/22/14 2000  CKTOTAL 243*   BNP: No components found with this basename: POCBNP,  CBG:  Recent Labs Lab 02/22/14 1314 02/22/14 2047 02/23/14 0615  GLUCAP 187* 142* 167*    Recent Results (from the past 240 hour(s))  URINE CULTURE     Status: None   Collection Time    02/13/14  5:06 PM      Result Value Ref Range Status   Specimen Description URINE, RANDOM   Final   Special Requests NONE   Final   Culture  Setup Time     Final  Value: 02/13/2014 18:27     Performed at [facility]     Final   Value: 20,OOO COLONIES/ML     Performed at [facility]   Culture     Final   Value: Multiple bacterial morphotypes present, none predominant. Suggest appropriate recollection if clinically indicated.     Performed at [facility]   Report Status 02/14/2014 FINAL   Final  CULTURE, BLOOD (ROUTINE X 2)     Status: None   Collection Time    02/22/14  1:00 PM      Result Value Ref Range Status   Specimen Description BLOOD RIGHT ANTECUBITAL   Final   Special Requests BOTTLES DRAWN AEROBIC AND ANAEROBIC 10CC   Final   Culture  Setup Time     Final   Value: 02/22/2014 17:30     Performed at [facility]   Culture     Final   Value:        BLOOD CULTURE RECEIVED NO GROWTH TO DATE CULTURE WILL BE HELD FOR 5 DAYS BEFORE ISSUING A FINAL NEGATIVE REPORT     Performed at [facility]   Report Status PENDING   Incomplete  CULTURE, BLOOD (ROUTINE X 2)     Status: None   Collection Time    02/22/14  1:20 PM      Result Value Ref Range Status   Specimen Description BLOOD RIGHT HAND   Final   Special Requests BOTTLES DRAWN AEROBIC AND ANAEROBIC 10CC   Final   Culture  Setup Time     Final   Value: 02/22/2014 17:29     Performed at [facility]   Culture     Final   Value:        BLOOD CULTURE RECEIVED NO GROWTH TO DATE CULTURE WILL BE HELD FOR 5 DAYS BEFORE ISSUING A FINAL NEGATIVE REPORT     Performed at [facility]   Report Status PENDING   Incomplete     Studies:              All Imaging reviewed and is as per above notation   Scheduled Meds: . sodium chloride   Intravenous STAT  . ceFEPime (MAXIPIME) IV  1 g Intravenous Q24H  . darifenacin  7.5 mg Oral Daily  . enoxaparin (LOVENOX) injection  55 mg Subcutaneous Q24H  . OLANZapine-FLUoxetine  1 capsule Oral QHS  . pantoprazole (PROTONIX) IV  40 mg Intravenous Q24H  . tetrahydrozoline  2 drop Both Eyes QID  . vancomycin  1,500 mg Intravenous Q24H  . Vortioxetine HBr  10 mg Oral BH-q7a   Continuous Infusions: . sodium chloride 150 mL/hr (02/22/14 1453)  . sodium chloride 75 mL/hr at 02/23/14 0800     Assessment/Plan: 1. Nausea vomiting transaminitis-broad differential.  Thought to be potentially secondary to SSRIs, imaging studies have not demonstrated any demonstrable organic deficit and gastroenterology has been consulted. A gastric emptying study was ordered but cannot be done until Monday so I will defer to gastroenterology whether this needs to be completed vs. Not--I do not think she is septic so we will discontinue vancomycin and cefepime started for unclear reason 2. Hydrocephalus status post shunt-reprogrammed 7/15 admission-stable 3. Migraines unclear this could be causing some of the symptomatology she has had nausea vomiting. Monitor.  4. History bipolar-currently on  olanzapine/fluoxetine each bedtime as well as Vortiextine-we could consult psychiatry if felt appropriate by gastroenterology? May  have to taper off of SSRIs. Continue lorazepam 1 mg every 6 when necessary 5. Hypotension-old losartan 100 daily, probably constitutional from volume losses with vomiting. Continue IV saline 150 cc per hour overnight. 6. Lactic acidosis-trend unclear etiology but currently related to #5, also was on metformin which can cause is 7. Diabetes mellitus type 2 with neuropathy-blood sugar 130 to 187.  Hold off on sliding scale for now. Continue gabapentin twice a day when necessary 8. Vitamin D deficiency-hold ergocalciferol for now 9. Urinary incontinence-continue VESIcare 10 mg daily  Code Status: Full Family Communication: None present Disposition Plan: Inpatient   Verneita Griffes, MD  [facility] Pager [phone] 02/23/2014, 11:04 AM    LOS: 1 day

## 2014-02-24 LAB — CBC WITH DIFFERENTIAL/PLATELET
Basophils Absolute: 0 10*3/uL (ref 0.0–0.1)
Basophils Relative: 1 % (ref 0–1)
Eosinophils Absolute: 0.2 10*3/uL (ref 0.0–0.7)
Eosinophils Relative: 4 % (ref 0–5)
HCT: 37.3 % (ref 36.0–46.0)
Hemoglobin: 12.2 g/dL (ref 12.0–15.0)
Lymphocytes Relative: 30 % (ref 12–46)
Lymphs Abs: 1.3 10*3/uL (ref 0.7–4.0)
MCH: 30.5 pg (ref 26.0–34.0)
MCHC: 32.7 g/dL (ref 30.0–36.0)
MCV: 93.3 fL (ref 78.0–100.0)
Monocytes Absolute: 0.4 10*3/uL (ref 0.1–1.0)
Monocytes Relative: 10 % (ref 3–12)
Neutro Abs: 2.3 10*3/uL (ref 1.7–7.7)
Neutrophils Relative %: 55 % (ref 43–77)
Platelets: 205 10*3/uL (ref 150–400)
RBC: 4 MIL/uL (ref 3.87–5.11)
RDW: 14.3 % (ref 11.5–15.5)
WBC: 4.2 10*3/uL (ref 4.0–10.5)

## 2014-02-24 LAB — BASIC METABOLIC PANEL
Anion gap: 13 (ref 5–15)
BUN: 8 mg/dL (ref 6–23)
CO2: 22 mEq/L (ref 19–32)
Calcium: 8.4 mg/dL (ref 8.4–10.5)
Chloride: 102 mEq/L (ref 96–112)
Creatinine, Ser: 0.75 mg/dL (ref 0.50–1.10)
GFR calc Af Amer: >90 mL/min (ref >90)
GFR calc non Af Amer: 86 mL/min — ABNORMAL LOW (ref >90)
Glucose, Bld: 127 mg/dL — ABNORMAL HIGH (ref 70–99)
Potassium: 3.8 mEq/L (ref 3.7–5.3)
Sodium: 137 mEq/L (ref 137–147)

## 2014-02-24 LAB — HEPATIC FUNCTION PANEL
ALT: 189 U/L — ABNORMAL HIGH (ref 0–35)
AST: 267 U/L — ABNORMAL HIGH (ref 0–37)
Albumin: 2.8 g/dL — ABNORMAL LOW (ref 3.5–5.2)
Alkaline Phosphatase: 247 U/L — ABNORMAL HIGH (ref 39–117)
Bilirubin, Direct: 0.7 mg/dL — ABNORMAL HIGH (ref 0.0–0.3)
Indirect Bilirubin: 1 mg/dL — ABNORMAL HIGH (ref 0.3–0.9)
Total Bilirubin: 1.7 mg/dL — ABNORMAL HIGH (ref 0.3–1.2)
Total Protein: 6.6 g/dL (ref 6.0–8.3)

## 2014-02-24 LAB — GLUCOSE, CAPILLARY: Glucose-Capillary: 124 mg/dL — ABNORMAL HIGH (ref 70–99)

## 2014-02-24 MED ORDER — TRAMADOL HCL 50 MG PO TABS
50.0000 mg | ORAL_TABLET | Freq: Four times a day (QID) | ORAL | Status: DC | PRN
  Administered 2014-02-24 – 2014-03-01 (×11): 50 mg via ORAL
  Filled 2014-02-24 (×12): qty 1

## 2014-02-24 MED ORDER — LORAZEPAM 2 MG/ML IJ SOLN
1.0000 mg | Freq: Once | INTRAMUSCULAR | Status: AC
  Administered 2014-02-25: 2 mg via INTRAVENOUS
  Filled 2014-02-24: qty 1

## 2014-02-24 NOTE — Progress Notes (Signed)
Subjective: Sore throat this AM. She feels that it is secondary to all of her vomiting the other day.  Objective: Vital signs in last 24 hours: Temp:  [97.3 F (36.3 C)-97.9 F (36.6 C)] 97.5 F (36.4 C) (07/26 0446) Pulse Rate:  [52-81] 81 (07/26 0500) Resp:  [18-20] 18 (07/26 0446) BP: (149-169)/(57-89) 169/57 mmHg (07/26 0446) SpO2:  [95 %-97 %] 95 % (07/26 0446) Last BM Date: 02/22/14  Intake/Output from previous day: 07/25 0701 - 07/26 0700 In: 2205 [P.O.:480; I.V.:1725] Out: 1450 [Urine:1450] Intake/Output this shift:    General appearance: alert and no distress GI: soft, non-tender; bowel sounds normal; no masses,  no organomegaly  Lab Results:  Recent Labs  02/22/14 1320 02/23/14 0406 02/24/14 0547  WBC 5.3 5.5 4.2  HGB 12.5 12.5 12.2  HCT 39.0 39.1 37.3  PLT 205 211 205   BMET  Recent Labs  02/22/14 1320 02/23/14 0406 02/24/14 0547  NA 139 143 137  K 4.0 4.2 3.8  CL 99 104 102  CO2 21 25 22   GLUCOSE 201* 130* 127*  BUN 23 18 8   CREATININE 1.55* 1.13* 0.75  CALCIUM 8.6 8.7 8.4   LFT  Recent Labs  02/23/14 0406  PROT 6.6  ALBUMIN 2.9*  AST 346*  ALT 218*  ALKPHOS 258*  BILITOT 1.6*   PT/INR No results found for this basename: LABPROT, INR,  in the last 72 hours Hepatitis Panel No results found for this basename: HEPBSAG, HCVAB, HEPAIGM, HEPBIGM,  in the last 72 hours C-Diff No results found for this basename: CDIFFTOX,  in the last 72 hours Fecal Lactopherrin No results found for this basename: FECLLACTOFRN,  in the last 72 hours  Studies/Results: Ct Head Wo Contrast  02/22/2014   CLINICAL DATA:  Fall  EXAM: CT HEAD WITHOUT CONTRAST  TECHNIQUE: Contiguous axial images were obtained from the base of the skull through the vertex without intravenous contrast.  COMPARISON:  MRI brain 02/13/2014  FINDINGS: There is no evidence of mass effect, midline shift or extra-axial fluid collections. There is no evidence of a space-occupying lesion  or intracranial hemorrhage. There is no evidence of a cortical-based area of acute infarction. There is periventricular white matter low attenuation likely secondary to microangiopathy.  There is a ventriculoperitoneal shunt catheter via a right parietal approach with the tip in unchanged position. The ventricles and sulci are appropriate for the patient's age. The basal cisterns are patent.  Visualized portions of the orbits are unremarkable. The visualized portions of the paranasal sinuses and mastoid air cells are unremarkable.  The osseous structures are unremarkable.  IMPRESSION: 1. No acute intracranial pathology. 2. Stable position of the ventriculoperitoneal shunt catheter.   Electronically Signed   By: Kathreen Devoid   On: 02/22/2014 15:42   Ct Abdomen Pelvis W Contrast  02/22/2014   CLINICAL DATA:  Fall  EXAM: CT ABDOMEN AND PELVIS WITH CONTRAST  TECHNIQUE: Multidetector CT imaging of the abdomen and pelvis was performed using the standard protocol following bolus administration of intravenous contrast.  CONTRAST:  6mL OMNIPAQUE IOHEXOL 300 MG/ML  SOLN  COMPARISON:  02/14/2014  FINDINGS: Mild bibasilar atelectasis. Postcholecystectomy Diffuse hepatic steatosis. Postcholecystectomy Borderline splenomegaly with a maximal length of 14.2 cm. Kidneys, pancreas, and adrenal glands are within normal limits. Ventral abdominal hernia containing adipose tissue is stable VP shunt catheter traverses into the peritoneal space. It is coiled in the pelvis. Normal appendix. Bladder is distended. Uterus and adnexa are unremarkable. No vertebral compression deformity. Degenerative change  with spinal stenosis at L4-5.  IMPRESSION: No acute abdominal injury.  Borderline splenomegaly.  Bibasilar atelectasis.  Stable chronic changes.   Electronically Signed   By: Maryclare Bean M.D.   On: 02/22/2014 15:45   Dg Chest Port 1 View  02/22/2014   CLINICAL DATA:  Fall.  EXAM: PORTABLE CHEST - 1 VIEW  COMPARISON:  07/03/2010.   FINDINGS: Ventriculoperitoneal shunt tubing noted over the right chest. Mediastinum and hilar structures normal. Cardiomegaly with normal pulmonary vascularity. Low lung volumes. Mild chronic interstitial lung disease. No acute pulmonary infiltrate. No pleural effusion or pneumothorax. No acute osseous abnormality .  IMPRESSION: 1. Mild cardiomegaly.  No CHF. 2. Chronic interstitial lung disease. No acute pulmonary infiltrate. 3. Ventriculoperitoneal shunt noted.   Electronically Signed   By: Marcello Moores  Register   On: 02/22/2014 13:31    Medications:  Scheduled: . darifenacin  7.5 mg Oral Daily  . enoxaparin (LOVENOX) injection  55 mg Subcutaneous Q24H  . OLANZapine-FLUoxetine  1 capsule Oral QHS  . pantoprazole (PROTONIX) IV  40 mg Intravenous Q24H  . tetrahydrozoline  2 drop Both Eyes QID  . Vortioxetine HBr  10 mg Oral BH-q7a   Continuous: . sodium chloride 150 mL/hr (02/22/14 1453)  . sodium chloride 75 mL/hr at 02/24/14 7564    Assessment/Plan: 1) Dilated CBD. 2) Abnormal liver enzymes.   MRCP ordered yesterday, but the test has not been performed.  Await MRCP results as well as liver panel for this am.   LOS: 2 days   Jhair Witherington D 02/24/2014, 7:42 AM

## 2014-02-24 NOTE — Progress Notes (Signed)
Note: This document was prepared with digital dictation and possible smart phrase technology. Any transcriptional errors that result from this process are unintentional.   [name] [id number] DOB: 1946/04/26 DOA: 02/22/2014 PCP: [name], MD  Brief narrative: 68 y/o ?, recent admit 7/15-7/17 near syncope, known hydrocephalus s/p shunt [reprogrammed 7/15 admission], migraines, recent falls, Bipolar, htn., dm ty 2 admitted with hypotension, liver/renal failure and lactic acidosis  Past medical history-As per Problem list Chart reviewed as below- reviewed  Consultants:  Gi-hung  Procedures:  None yet  Antibiotics:  none   Subjective   Doing fair at present Episodes of nausea vomiting and was able to keep down anything by mouth Has a headache right now but not severe-requesting oral tablet No fever no chills No abdominal pain no diarrhea Vomitus nonbloody   Objective    Interim History: none  Telemetry: none   Objective: Filed Vitals:   02/23/14 1455 02/23/14 2000 02/24/14 0446 02/24/14 0500  BP: 149/64 156/89 169/57   Pulse: 68 69 52 81  Temp: 97.9 F (36.6 C) 97.3 F (36.3 C) 97.5 F (36.4 C)   TempSrc: Oral Oral Oral   Resp: 18 20 18    Height:      Weight:      SpO2: 95% 97% 95%     Intake/Output Summary (Last 24 hours) at 02/24/14 1128 Last data filed at 02/24/14 0900  Gross per 24 hour  Intake   2250 ml  Output   1450 ml  Net    800 ml    Exam:  General: Alert pleasant oriented not in distress Cardiovascular: S1-S2 no murmur rub or gallop Respiratory: Clinically clear Abdomen: Soft nontender currently  Data Reviewed: Basic Metabolic Panel:  Recent Labs Lab 02/22/14 1320 02/22/14 1820 02/23/14 0406 02/24/14 0547  NA 139  --  143 137  K 4.0  --  4.2 3.8  CL 99  --  104 102  CO2 21  --  25 22  GLUCOSE 201*  --  130* 127*  BUN 23  --  18 8  CREATININE 1.55*  --  1.13* 0.75  CALCIUM 8.6  --  8.7 8.4  MG  --  1.9   --   --    Liver Function Tests:  Recent Labs Lab 02/22/14 1320 02/23/14 0406 02/24/14 0547  AST 315* 346* 267*  ALT 202* 218* 189*  ALKPHOS 282* 258* 247*  BILITOT 1.9* 1.6* 1.7*  PROT 7.0 6.6 6.6  ALBUMIN 2.9* 2.9* 2.8*    Recent Labs Lab 02/22/14 1320  LIPASE 19   No results found for this basename: AMMONIA,  in the last 168 hours CBC:  Recent Labs Lab 02/22/14 1320 02/23/14 0406 02/24/14 0547  WBC 5.3 5.5 4.2  NEUTROABS 4.0  --  2.3  HGB 12.5 12.5 12.2  HCT 39.0 39.1 37.3  MCV 97.0 95.8 93.3  PLT 205 211 205   Cardiac Enzymes:  Recent Labs Lab 02/22/14 2000  CKTOTAL 243*   BNP: No components found with this basename: POCBNP,  CBG:  Recent Labs Lab 02/22/14 2047 02/23/14 0615 02/23/14 1203 02/23/14 2105 02/24/14 0553  GLUCAP 142* 167* 167* 130* 124*    Recent Results (from the past 240 hour(s))  CULTURE, BLOOD (ROUTINE X 2)     Status: None   Collection Time    02/22/14  1:00 PM      Result Value Ref Range Status   Specimen Description BLOOD RIGHT ANTECUBITAL   Final  Special Requests BOTTLES DRAWN AEROBIC AND ANAEROBIC 10CC   Final   Culture  Setup Time     Final   Value: 02/22/2014 17:30     Performed at [facility]   Culture     Final   Value:        BLOOD CULTURE RECEIVED NO GROWTH TO DATE CULTURE WILL BE HELD FOR 5 DAYS BEFORE ISSUING A FINAL NEGATIVE REPORT     Performed at [facility]   Report Status PENDING   Incomplete  CULTURE, BLOOD (ROUTINE X 2)     Status: None   Collection Time    02/22/14  1:20 PM      Result Value Ref Range Status   Specimen Description BLOOD RIGHT HAND   Final   Special Requests BOTTLES DRAWN AEROBIC AND ANAEROBIC 10CC   Final   Culture  Setup Time     Final   Value: 02/22/2014 17:29     Performed at [facility]   Culture     Final   Value:        BLOOD CULTURE RECEIVED NO GROWTH TO DATE CULTURE WILL BE HELD FOR 5 DAYS BEFORE ISSUING A FINAL NEGATIVE REPORT      Performed at [facility]   Report Status PENDING   Incomplete  URINE CULTURE     Status: None   Collection Time    02/22/14  2:54 PM      Result Value Ref Range Status   Specimen Description URINE, CLEAN CATCH   Final   Special Requests NONE   Final   Culture  Setup Time     Final   Value: 02/22/2014 16:01     Performed at [place]     Final   Value: 75,000 COLONIES/ML     Performed at [facility]   Culture     Final   Value: Multiple bacterial morphotypes present, none predominant. Suggest appropriate recollection if clinically indicated.     Performed at [facility]   Report Status 02/23/2014 FINAL   Final     Studies:              All Imaging reviewed and is as per above notation   Scheduled Meds: . darifenacin  7.5 mg Oral Daily  . enoxaparin (LOVENOX) injection  55 mg Subcutaneous Q24H  . OLANZapine-FLUoxetine  1 capsule Oral QHS  . pantoprazole (PROTONIX) IV  40 mg Intravenous Q24H  . tetrahydrozoline  2 drop Both Eyes QID  . Vortioxetine HBr  10 mg Oral BH-q7a   Continuous Infusions: . sodium chloride 150 mL/hr (02/22/14 1453)  . sodium chloride 75 mL/hr at 02/24/14 0338     Assessment/Plan: 1. Nausea vomiting transaminitis-broad differential.  Thought to be potentially secondary to SSRIs, imaging studies have not demonstrated any demonstrable organic deficit and gastroenterology has been consulted. A gastric emptying study was ordered but cannot be done until Monday so I will defer to gastroenterology decision regarding this. MRCP is pending-Appreciate input-will order something for anxiety/claustrophobia prior to MRCP as patient requests is  2. Hydrocephalus status post shunt-reprogrammed 7/15 admission-stable 3. Migraines unclear this could be causing some of the symptomatology she has had nausea vomiting. Monitor.  4. History bipolar-currently on olanzapine/fluoxetine each bedtime as well as Vortiextine-we could  consult psychiatry if felt appropriate by gastroenterology? May have to taper off of SSRIs.  Await results of MRI  Continue lorazepam 1 mg  every 6 when necessary 5. Hypotension-holding losartan 100 daily from admission-secondary to nausea vomiting.  cut back  IV saline 150 cc--50 mL  6. Lactic acidosis-trend unclear etiology but currently related to #5, also was on metformin which can cause. 7. Diabetes mellitus type 2 with neuropathy-blood sugar  124-167.  Hold off on sliding scale for now. Continue gabapentin twice a day when necessary 8. Vitamin D deficiency-hold ergocalciferol for now 9. Urinary incontinence-continue VESIcare 10 mg daily  Code Status: Full Family Communication: None present Disposition Plan: Inpatient   Verneita Griffes, MD  [facility] Pager [phone] 02/24/2014, 11:28 AM    LOS: 2 days

## 2014-02-25 ENCOUNTER — Inpatient Hospital Stay (HOSPITAL_COMMUNITY): Payer: PRIVATE HEALTH INSURANCE

## 2014-02-25 DIAGNOSIS — Z9089 Acquired absence of other organs: Secondary | ICD-10-CM

## 2014-02-25 DIAGNOSIS — R7989 Other specified abnormal findings of blood chemistry: Secondary | ICD-10-CM

## 2014-02-25 DIAGNOSIS — R7401 Elevation of levels of liver transaminase levels: Secondary | ICD-10-CM

## 2014-02-25 DIAGNOSIS — R7402 Elevation of levels of lactic acid dehydrogenase (LDH): Secondary | ICD-10-CM

## 2014-02-25 DIAGNOSIS — R740 Nonspecific elevation of levels of transaminase and lactic acid dehydrogenase [LDH]: Secondary | ICD-10-CM

## 2014-02-25 LAB — COMPREHENSIVE METABOLIC PANEL
ALT: 161 U/L — ABNORMAL HIGH (ref 0–35)
AST: 197 U/L — ABNORMAL HIGH (ref 0–37)
Albumin: 2.9 g/dL — ABNORMAL LOW (ref 3.5–5.2)
Alkaline Phosphatase: 247 U/L — ABNORMAL HIGH (ref 39–117)
Anion gap: 14 (ref 5–15)
BUN: 7 mg/dL (ref 6–23)
CO2: 24 mEq/L (ref 19–32)
Calcium: 8.5 mg/dL (ref 8.4–10.5)
Chloride: 102 mEq/L (ref 96–112)
Creatinine, Ser: 0.7 mg/dL (ref 0.50–1.10)
GFR calc Af Amer: >90 mL/min (ref >90)
GFR calc non Af Amer: 88 mL/min — ABNORMAL LOW (ref >90)
Glucose, Bld: 111 mg/dL — ABNORMAL HIGH (ref 70–99)
Potassium: 3.9 mEq/L (ref 3.7–5.3)
Sodium: 140 mEq/L (ref 137–147)
Total Bilirubin: 1.5 mg/dL — ABNORMAL HIGH (ref 0.3–1.2)
Total Protein: 6.9 g/dL (ref 6.0–8.3)

## 2014-02-25 LAB — CBC WITH DIFFERENTIAL/PLATELET
Basophils Absolute: 0 10*3/uL (ref 0.0–0.1)
Basophils Relative: 1 % (ref 0–1)
Eosinophils Absolute: 0.2 10*3/uL (ref 0.0–0.7)
Eosinophils Relative: 4 % (ref 0–5)
HCT: 38.9 % (ref 36.0–46.0)
Hemoglobin: 12.7 g/dL (ref 12.0–15.0)
Lymphocytes Relative: 33 % (ref 12–46)
Lymphs Abs: 1.5 10*3/uL (ref 0.7–4.0)
MCH: 30.4 pg (ref 26.0–34.0)
MCHC: 32.6 g/dL (ref 30.0–36.0)
MCV: 93.1 fL (ref 78.0–100.0)
Monocytes Absolute: 0.4 10*3/uL (ref 0.1–1.0)
Monocytes Relative: 9 % (ref 3–12)
Neutro Abs: 2.4 10*3/uL (ref 1.7–7.7)
Neutrophils Relative %: 53 % (ref 43–77)
Platelets: 235 10*3/uL (ref 150–400)
RBC: 4.18 MIL/uL (ref 3.87–5.11)
RDW: 14.3 % (ref 11.5–15.5)
WBC: 4.5 10*3/uL (ref 4.0–10.5)

## 2014-02-25 LAB — FERRITIN: Ferritin: 563 ng/mL — ABNORMAL HIGH (ref 10–291)

## 2014-02-25 MED ORDER — HYDRALAZINE HCL 20 MG/ML IJ SOLN
5.0000 mg | Freq: Once | INTRAMUSCULAR | Status: AC
  Administered 2014-02-25: 5 mg via INTRAVENOUS
  Filled 2014-02-25: qty 1

## 2014-02-25 MED ORDER — LOSARTAN POTASSIUM 50 MG PO TABS
100.0000 mg | ORAL_TABLET | Freq: Every day | ORAL | Status: DC
  Administered 2014-02-25 – 2014-03-01 (×4): 100 mg via ORAL
  Filled 2014-02-25 (×5): qty 2

## 2014-02-25 MED ORDER — GADOBENATE DIMEGLUMINE 529 MG/ML IV SOLN
20.0000 mL | Freq: Once | INTRAVENOUS | Status: AC
  Administered 2014-02-25: 20 mL via INTRAVENOUS

## 2014-02-25 MED ORDER — PANTOPRAZOLE SODIUM 40 MG PO TBEC
40.0000 mg | DELAYED_RELEASE_TABLET | Freq: Every day | ORAL | Status: DC
  Administered 2014-02-25 – 2014-03-01 (×5): 40 mg via ORAL
  Filled 2014-02-25 (×4): qty 1

## 2014-02-25 NOTE — Progress Notes (Signed)
Note: This document was prepared with digital dictation and possible smart phrase technology. Any transcriptional errors that result from this process are unintentional.   [name] [id number] DOB: June 17, 1946 DOA: 02/22/2014 PCP: Kiara Calico, MD  Brief narrative: 68 y/o ?, recent admit 7/15-7/17 near syncope, known hydrocephalus s/p shunt [reprogrammed 7/15 admission], migraines, recent falls, Bipolar, htn., dm ty 2 admitted with hypotension, liver/renal failure and lactic acidosis  Past medical history-As per Problem list Chart reviewed as below- reviewed  Consultants:  Gi-hung  Procedures:  None yet  Antibiotics:  none   Subjective   Doing fair at present Couldn't complete Gastric emptying study and vomited  States she thinks she has some symptoms similar to her cousin Who was diagnosed with a stricture MRCP done but not read yet   Objective    Interim History: none  Telemetry: none   Objective: Filed Vitals:   02/25/14 1127 02/25/14 1135 02/25/14 1225 02/25/14 1527  BP: 186/83 171/96 158/56 168/88  Pulse: 46 79 72 77  Temp:   98.2 F (36.8 C) 97.5 F (36.4 C)  TempSrc: Oral  Oral Oral  Resp: 18 16 18 18   Height:      Weight:      SpO2: 93% 97% 97% 96%    Intake/Output Summary (Last 24 hours) at 02/25/14 1645 Last data filed at 02/25/14 1614  Gross per 24 hour  Intake   1275 ml  Output   1152 ml  Net    123 ml    Exam:  General: Alert pleasant oriented not in distress Cardiovascular: S1-S2 no murmur rub or gallop Respiratory: Clinically clear Abdomen: Soft nontender currently  Data Reviewed: Basic Metabolic Panel:  Recent Labs Lab 02/22/14 1320 02/22/14 1820 02/23/14 0406 02/24/14 0547 02/25/14 0510  NA 139  --  143 137 140  K 4.0  --  4.2 3.8 3.9  CL 99  --  104 102 102  CO2 21  --  25 22 24   GLUCOSE 201*  --  130* 127* 111*  BUN 23  --  18 8 7   CREATININE 1.55*  --  1.13* 0.75 0.70  CALCIUM 8.6  --  8.7 8.4  8.5  MG  --  1.9  --   --   --    Liver Function Tests:  Recent Labs Lab 02/22/14 1320 02/23/14 0406 02/24/14 0547 02/25/14 0510  AST 315* 346* 267* 197*  ALT 202* 218* 189* 161*  ALKPHOS 282* 258* 247* 247*  BILITOT 1.9* 1.6* 1.7* 1.5*  PROT 7.0 6.6 6.6 6.9  ALBUMIN 2.9* 2.9* 2.8* 2.9*    Recent Labs Lab 02/22/14 1320  LIPASE 19   No results found for this basename: AMMONIA,  in the last 168 hours CBC:  Recent Labs Lab 02/22/14 1320 02/23/14 0406 02/24/14 0547 02/25/14 0510  WBC 5.3 5.5 4.2 4.5  NEUTROABS 4.0  --  2.3 2.4  HGB 12.5 12.5 12.2 12.7  HCT 39.0 39.1 37.3 38.9  MCV 97.0 95.8 93.3 93.1  PLT 205 211 205 235   Cardiac Enzymes:  Recent Labs Lab 02/22/14 2000  CKTOTAL 243*   BNP: No components found with this basename: POCBNP,  CBG:  Recent Labs Lab 02/22/14 2047 02/23/14 0615 02/23/14 1203 02/23/14 2105 02/24/14 0553  GLUCAP 142* 167* 167* 130* 124*    Recent Results (from the past 240 hour(s))  CULTURE, BLOOD (ROUTINE X 2)     Status: None   Collection Time    02/22/14  1:00 PM      Result Value Ref Range Status   Specimen Description BLOOD RIGHT ANTECUBITAL   Final   Special Requests BOTTLES DRAWN AEROBIC AND ANAEROBIC 10CC   Final   Culture  Setup Time     Final   Value: 02/22/2014 17:30     Performed at [facility]   Culture     Final   Value:        BLOOD CULTURE RECEIVED NO GROWTH TO DATE CULTURE WILL BE HELD FOR 5 DAYS BEFORE ISSUING A FINAL NEGATIVE REPORT     Performed at [facility]   Report Status PENDING   Incomplete  CULTURE, BLOOD (ROUTINE X 2)     Status: None   Collection Time    02/22/14  1:20 PM      Result Value Ref Range Status   Specimen Description BLOOD RIGHT HAND   Final   Special Requests BOTTLES DRAWN AEROBIC AND ANAEROBIC 10CC   Final   Culture  Setup Time     Final   Value: 02/22/2014 17:29     Performed at [facility]   Culture     Final   Value:        BLOOD CULTURE  RECEIVED NO GROWTH TO DATE CULTURE WILL BE HELD FOR 5 DAYS BEFORE ISSUING A FINAL NEGATIVE REPORT     Performed at [facility]   Report Status PENDING   Incomplete  URINE CULTURE     Status: None   Collection Time    02/22/14  2:54 PM      Result Value Ref Range Status   Specimen Description URINE, CLEAN CATCH   Final   Special Requests NONE   Final   Culture  Setup Time     Final   Value: 02/22/2014 16:01     Performed at [place]     Final   Value: 75,000 COLONIES/ML     Performed at [facility]   Culture     Final   Value: Multiple bacterial morphotypes present, none predominant. Suggest appropriate recollection if clinically indicated.     Performed at [facility]   Report Status 02/23/2014 FINAL   Final     Studies:              All Imaging reviewed and is as per above notation   Scheduled Meds: . darifenacin  7.5 mg Oral Daily  . enoxaparin (LOVENOX) injection  55 mg Subcutaneous Q24H  . OLANZapine-FLUoxetine  1 capsule Oral QHS  . pantoprazole  40 mg Oral Q0600  . tetrahydrozoline  2 drop Both Eyes QID  . Vortioxetine HBr  10 mg Oral BH-q7a   Continuous Infusions: . sodium chloride 50 mL/hr at 02/24/14 1244     Assessment/Plan: 1. Nausea vomiting transaminitis-broad differential.  Thought to be potentially secondary to SSRIs, imaging studies have not demonstrated yet any demonstrable organic deficit. A gastric emptying study was inconclusive as she vomited.  MRCP is pending-Appreciate GI input-Agree with Endoscopy 2. Hydrocephalus status post shunt-reprogrammed 7/15 admission-stable-no evidence currently of any concern and no further comment regarding mentation or neurological status as not a part of her current hospital concerns which are purely gastro-intestinal 3. Migraines -stable-no current Headache.  Unrelated to above 4. History bipolar-currently on olanzapine/fluoxetine each bedtime as well as Vortiextine-.   Await results of MRCP-medications unlikely precipitant Continue lorazepam 1 mg every 6 when necessary 5. Hypotension  on admission-held losartan 100 daily initally-re-started 7/27-secondary to nausea vomiting.  cut back  IV saline 150 cc--50 mL 6. Lactic acidosis-trend unclear etiology but currently related to #5, also was on metformin which can cause. 7. Diabetes mellitus type 2 with neuropathy-blood sugar  111-130.  Hold off on sliding scale for now. Continue gabapentin twice a day when necessary 8. Vitamin D deficiency-hold ergocalciferol for now 9. Urinary incontinence-continue VESIcare 10 mg daily  Code Status: Full Family Communication: None present Disposition Plan: Inpatient pending work-up   Verneita Griffes, MD  [facility] Pager [phone] 02/25/2014, 4:45 PM    LOS: 3 days

## 2014-02-25 NOTE — Progress Notes (Signed)
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 Patient with MRI scan of abdomen today. Her VP shunt will need to be reprogrammed secondary to exposure to high field strength magnet.  . Currently that level I.5. Shunt re\re programmed to level with 0.5 which is her normal setting area.  No further treatment needed. Reconsult if I can be of service.

## 2014-02-25 NOTE — Clinical Documentation Improvement (Signed)
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 AVM is being questioned.  Please clarify if you feel an AVM likely: Ruled in              Ruled out              Other Condition   Please document findings in next progress note and discharge summary if applicable.  Thank You, Zoila Shutter ,RN Clinical Documentation Specialist:  [facility]

## 2014-02-25 NOTE — Progress Notes (Signed)
Daily Rounding Note  02/25/2014, 9:11 AM  LOS: 3 days   SUBJECTIVE:       Pt unable to complete the GES as she vomited the egg.  [place] with having MRCP.   No abdominal pain.   Dysphagia that began with vomiting 2 weeks ago.  Previously would vomit once per week, mostly in AM Emesis is of undigested, just consumed foods and green, bilious.  Describes getting pancreatitis from endoscopy around time of GB surgery, this likely post ercp pancreatitis.   OBJECTIVE:         Vital signs in last 24 hours:    Temp:  [97.8 F (36.6 C)-98.2 F (36.8 C)] 98.2 F (36.8 C) (07/27 0559) Pulse Rate:  [54-79] 79 (07/27 0559) Resp:  [18-19] 18 (07/27 0559) BP: (153-169)/(68-125) 169/96 mmHg (07/27 0559) SpO2:  [96 %-99 %] 96 % (07/27 0559) Last BM Date: 02/22/14 General: pleasant, appropriate.  Comfortable.  Looks well   Heart: RRR Chest: clear Abdomen: soft, NT, ND.  No mass or HSM.   Extremities: no CCE Neuro/Psych:  Oriented x 3.  Appropriate.  No limb weakness.   Intake/Output from previous day: 07/26 0701 - 07/27 0700 In: 1635 [P.O.:360; I.V.:1275] Out: 1150 [Urine:1150]  Intake/Output this shift:    Lab Results:  Recent Labs  02/23/14 0406 02/24/14 0547 02/25/14 0510  WBC 5.5 4.2 4.5  HGB 12.5 12.2 12.7  HCT 39.1 37.3 38.9  PLT 211 205 235   BMET  Recent Labs  02/23/14 0406 02/24/14 0547 02/25/14 0510  NA 143 137 140  K 4.2 3.8 3.9  CL 104 102 102  CO2 25 22 24   GLUCOSE 130* 127* 111*  BUN 18 8 7   CREATININE 1.13* 0.75 0.70  CALCIUM 8.7 8.4 8.5   LFT  Recent Labs  02/23/14 0406 02/24/14 0547 02/25/14 0510  PROT 6.6 6.6 6.9  ALBUMIN 2.9* 2.8* 2.9*  AST 346* 267* 197*  ALT 218* 189* 161*  ALKPHOS 258* 247* 247*  BILITOT 1.6* 1.7* 1.5*  BILIDIR  --  0.7*  --   IBILI  --  1.0*  --     Scheduled Meds: . darifenacin  7.5 mg Oral Daily  . enoxaparin (LOVENOX) injection  55 mg Subcutaneous Q24H    . LORazepam  1-2 mg Intravenous Once  . OLANZapine-FLUoxetine  1 capsule Oral QHS  . pantoprazole (PROTONIX) IV  40 mg Intravenous Q24H  . tetrahydrozoline  2 drop Both Eyes QID  . Vortioxetine HBr  10 mg Oral BH-q7a   Continuous Infusions: . sodium chloride 50 mL/hr at 02/24/14 1244   PRN Meds:.gabapentin, LORazepam, morphine injection, promethazine, traMADol    ASSESMENT:   *  Abnormal  LFTs.  1 cm CBD (can be normal post cholecystectomy). Fatty liver. Borderline splenomegaly. Prior cholecystectomy 2005 and pneumobilia. Normal intrahepatic ducts, normal pancreatic duct. Hepatitis ABC serologies negative. Likely had previous ERCP but no records found.  Staff saying pt refusing MRCP, not true, she was unable to complete GES due to vomiting up the labelled egg but is ok with MRCP  *  Vomiting, x 2 weeks.  Nauseated then vomits quickly and feels better.   *  Dysphagia   *  S/p VP shunt.  MRI waiting on [employer] rep in order to proceed to MRCP.     PLAN   *  Needs automimmune markers.  *  EGD for dysphagia, tomorrow vs esophagram.   *  MRCP today.  Ok to eat afterwards.  *  Not planning to pursue GES at this point.    Azucena Freed  02/25/2014, 9:11 AM Pager: [phone]     Attending physician's note   I have taken an interval history, reviewed the chart and examined the patient. I agree with the Advanced Practitioner's note, impression and recommendations. Awaiting MRCP report before deciding on any further evaluation.   Pricilla Riffle. Fuller Plan, MD [facility]

## 2014-02-26 DIAGNOSIS — K8050 Calculus of bile duct without cholangitis or cholecystitis without obstruction: Principal | ICD-10-CM | POA: Diagnosis present

## 2014-02-26 DIAGNOSIS — R112 Nausea with vomiting, unspecified: Secondary | ICD-10-CM

## 2014-02-26 LAB — MITOCHONDRIAL ANTIBODIES: Mitochondrial M2 Ab, IgG: 0.39 (ref <0.91)

## 2014-02-26 LAB — ANA: Anti Nuclear Antibody(ANA): NEGATIVE

## 2014-02-26 LAB — ANTI-SMOOTH MUSCLE ANTIBODY, IGG: F-Actin IgG: 34 U — ABNORMAL HIGH (ref <20)

## 2014-02-26 MED ORDER — CIPROFLOXACIN IN D5W 400 MG/200ML IV SOLN: 400.0000 mg | Freq: Once | INTRAVENOUS | Status: DC

## 2014-02-26 MED ORDER — SODIUM CHLORIDE 0.9 % IV SOLN: INTRAVENOUS | Status: DC

## 2014-02-26 MED ORDER — INDOMETHACIN 50 MG RE SUPP
100.0000 mg | Freq: Once | RECTAL | Status: DC
  Filled 2014-02-26: qty 2

## 2014-02-26 MED ORDER — CIPROFLOXACIN IN D5W 400 MG/200ML IV SOLN
400.0000 mg | Freq: Once | INTRAVENOUS | Status: AC
  Administered 2014-02-28: 400 mg via INTRAVENOUS
  Filled 2014-02-26 (×2): qty 200

## 2014-02-26 NOTE — Progress Notes (Signed)
Daily Rounding Note  02/26/2014, 9:08 AM  LOS: 4 days   SUBJECTIVE:       No vomiting.  Feels well.  No abd pain.  OBJECTIVE:         Vital signs in last 24 hours:    Temp:  [97.5 F (36.4 C)-98.3 F (36.8 C)] 97.9 F (36.6 C) (07/28 0547) Pulse Rate:  [46-83] 74 (07/28 0829) Resp:  [16-18] 18 (07/28 0547) BP: (150-186)/(56-96) 157/69 mmHg (07/28 0829) SpO2:  [93 %-97 %] 97 % (07/28 0547) Last BM Date: 02/25/14 General: looks well, comfortable   Heart: RRR Chest: clear bil. Abdomen: soft, obese, NT.  ND.  Active BS  Extremities: no CCE Neuro/Psych:  Pleasant, relaxed.  No deficits. Oriented x 3.   Intake/Output from previous day: 07/27 0701 - 07/28 0700 In: 240 [P.O.:240] Out: 2 [Urine:2]  Intake/Output this shift: Total I/O In: -  Out: 600 [Urine:600]  Lab Results:  Recent Labs  02/24/14 0547 02/25/14 0510  WBC 4.2 4.5  HGB 12.2 12.7  HCT 37.3 38.9  PLT 205 235   BMET  Recent Labs  02/24/14 0547 02/25/14 0510  NA 137 140  K 3.8 3.9  CL 102 102  CO2 22 24  GLUCOSE 127* 111*  BUN 8 7  CREATININE 0.75 0.70  CALCIUM 8.4 8.5   LFT  Recent Labs  02/24/14 0547 02/25/14 0510  PROT 6.6 6.9  ALBUMIN 2.8* 2.9*  AST 267* 197*  ALT 189* 161*  ALKPHOS 247* 247*  BILITOT 1.7* 1.5*  BILIDIR 0.7*  --   IBILI 1.0*  --    PT/INR No results found for this basename: LABPROT, INR,  in the last 72 hours Hepatitis Panel No results found for this basename: HEPBSAG, HCVAB, HEPAIGM, HEPBIGM,  in the last 72 hours  Studies/Results: Mr 3d Recon At Scanner Mr Abd W/wo Cm/mrcp 02/25/2014   COMPARISON:  02/22/2014  FINDINGS: Trace left pleural effusion. Common bile duct dilatation to 11 mm with a 6 mm filling defect in the distal CBD and potentially and adjacent flat 0.5 x 0.2 cm filling defect also in the distal CBD.  Accessory left hepatic duct noted primarily serving the caudate lobe.  3 cm  periampullary duodenal diverticulum. 1.6 cm cyst, right kidney lower pole. Spleen unremarkable.  Clustered borderline sized porta hepatis lymph nodes. 6.5 x 3.4 cm supraumbilical ventral hernia contains omental adipose tissue. Trace free fluid along the gallbladder fossa.  No specific pancreatic abnormality identified.  IMPRESSION: 1. 6 mm filling defect in the distal common bile duct associated with mild CBD dilatation. Appearance favors distal CBD stone. I do not see definite enhancement to suggest that this is a mass. There is an adjacent duodenal diverticulum. 2. The caudate lobe bile duct branch attach is to the common hepatic duct below the level of the confluence of the right and left hepatic ducts. 3. Ventral hernia contains omental adipose tissue. 4. Trace bilateral pleural effusions with bibasilar mild atelectasis. Trace edema tracks along the porta hepatis, etiology uncertain.   Electronically Signed   By: Sherryl Barters M.D.   On: 02/25/2014 16:18    ASSESMENT:   * Abnormal LFTs. Fatty liver. Borderline splenomegaly. Prior cholecystectomy 2005 and pneumobilia. Normal intrahepatic ducts, normal pancreatic duct. Hepatitis ABC serologies negative. Likely had previous ERCP and associated pancreatitis but no records found.  Looks to have distal CBD stone as well as periampllary diverticulum and trace edema and adenopathy but  no mass at porta.   * Vomiting, x 2 weeks. Nauseated then vomits quickly and feels better.  * Dysphagia.  * S/p VP shunt.      PLAN   *  Needs EGD and ERCP. Scheduled Thursday at 0930 with Dr. Ardelle Balls  02/26/2014, 9:08 AM Pager: [phone]      Attending physician's note   I have taken an interval history, reviewed the chart and examined the patient. I agree with the Advanced Practitioner's note, impression and recommendations. MRCP shows a 6 mm CBD stone with biliary dilation. History of pancreatitis in 2005, possibly post ERCP. Dysphagia needs  further evaluation. EGD and ERCP with Dr. Carlean Purl on Thursday. Suggest Indomethacin 100 mg supp with ERCP.  Pricilla Riffle. Fuller Plan, MD [facility]

## 2014-02-26 NOTE — Progress Notes (Signed)
Note: This document was prepared with digital dictation and possible smart phrase technology. Any transcriptional errors that result from this process are unintentional.   [name] [id number]:[id number] DOB: 18-Dec-1945 DOA: 02/22/2014 PCP: [name], MD  Brief narrative: 68 y/o ?, recent admit 7/15-7/17 near syncope, known hydrocephalus s/p shunt [reprogrammed 7/15 admission], migraines, recent falls, Bipolar, htn., dm ty 2 admitted with hypotension, liver/renal failure and lactic acidosis  Past medical history-As per Problem list Chart reviewed as below- reviewed  Consultants:  Gi   Procedures:  MRCP, ERCP scheduled  Antibiotics:  none   Subjective   Patient and bed, no headache, no fever chills. No chest abdominal pain today. No nausea vomiting. No focal weakness.   Objective        Filed Vitals:   02/25/14 1527 02/25/14 2049 02/26/14 0547 02/26/14 0829  BP: 168/88 150/70 155/75 157/69  Pulse: 77 83 76 74  Temp: 97.5 F (36.4 C) 98.3 F (36.8 C) 97.9 F (36.6 C)   TempSrc: Oral Axillary Oral   Resp: 18 18 18    Height:      Weight:      SpO2: 96% 95% 97%     Intake/Output Summary (Last 24 hours) at 02/26/14 1257 Last data filed at 02/26/14 0750  Gross per 24 hour  Intake    240 ml  Output    602 ml  Net   -362 ml    Exam:  General: Alert pleasant oriented not in distress Cardiovascular: S1-S2 no murmur rub or gallop Respiratory: Clinically clear Abdomen: Soft nontender currently  Data Reviewed: Basic Metabolic Panel:  Recent Labs Lab 02/22/14 1320 02/22/14 1820 02/23/14 0406 02/24/14 0547 02/25/14 0510  NA 139  --  143 137 140  K 4.0  --  4.2 3.8 3.9  CL 99  --  104 102 102  CO2 21  --  25 22 24   GLUCOSE 201*  --  130* 127* 111*  BUN 23  --  18 8 7   CREATININE 1.55*  --  1.13* 0.75 0.70  CALCIUM 8.6  --  8.7 8.4 8.5  MG  --  1.9  --   --   --    Liver Function Tests:  Recent Labs Lab 02/22/14 1320 02/23/14 0406  02/24/14 0547 02/25/14 0510  AST 315* 346* 267* 197*  ALT 202* 218* 189* 161*  ALKPHOS 282* 258* 247* 247*  BILITOT 1.9* 1.6* 1.7* 1.5*  PROT 7.0 6.6 6.6 6.9  ALBUMIN 2.9* 2.9* 2.8* 2.9*    Recent Labs Lab 02/22/14 1320  LIPASE 19   No results found for this basename: AMMONIA,  in the last 168 hours CBC:  Recent Labs Lab 02/22/14 1320 02/23/14 0406 02/24/14 0547 02/25/14 0510  WBC 5.3 5.5 4.2 4.5  NEUTROABS 4.0  --  2.3 2.4  HGB 12.5 12.5 12.2 12.7  HCT 39.0 39.1 37.3 38.9  MCV 97.0 95.8 93.3 93.1  PLT 205 211 205 235   Cardiac Enzymes:  Recent Labs Lab 02/22/14 2000  CKTOTAL 243*   BNP: No components found with this basename: POCBNP,  CBG:  Recent Labs Lab 02/22/14 2047 02/23/14 0615 02/23/14 1203 02/23/14 2105 02/24/14 0553  GLUCAP 142* 167* 167* 130* 124*    Recent Results (from the past 240 hour(s))  CULTURE, BLOOD (ROUTINE X 2)     Status: None   Collection Time    02/22/14  1:00 PM      Result Value Ref Range Status   Specimen  Description BLOOD RIGHT ANTECUBITAL   Final   Special Requests BOTTLES DRAWN AEROBIC AND ANAEROBIC 10CC   Final   Culture  Setup Time     Final   Value: 02/22/2014 17:30     Performed at [facility]   Culture     Final   Value:        BLOOD CULTURE RECEIVED NO GROWTH TO DATE CULTURE WILL BE HELD FOR 5 DAYS BEFORE ISSUING A FINAL NEGATIVE REPORT     Performed at [facility]   Report Status PENDING   Incomplete  CULTURE, BLOOD (ROUTINE X 2)     Status: None   Collection Time    02/22/14  1:20 PM      Result Value Ref Range Status   Specimen Description BLOOD RIGHT HAND   Final   Special Requests BOTTLES DRAWN AEROBIC AND ANAEROBIC 10CC   Final   Culture  Setup Time     Final   Value: 02/22/2014 17:29     Performed at [facility]   Culture     Final   Value:        BLOOD CULTURE RECEIVED NO GROWTH TO DATE CULTURE WILL BE HELD FOR 5 DAYS BEFORE ISSUING A FINAL NEGATIVE REPORT      Performed at [facility]   Report Status PENDING   Incomplete  URINE CULTURE     Status: None   Collection Time    02/22/14  2:54 PM      Result Value Ref Range Status   Specimen Description URINE, CLEAN CATCH   Final   Special Requests NONE   Final   Culture  Setup Time     Final   Value: 02/22/2014 16:01     Performed at [place]     Final   Value: 75,000 COLONIES/ML     Performed at [facility]   Culture     Final   Value: Multiple bacterial morphotypes present, none predominant. Suggest appropriate recollection if clinically indicated.     Performed at [facility]   Report Status 02/23/2014 FINAL   Final     Studies:              All Imaging reviewed and is as per above notation   Scheduled Meds: . [START ON 02/28/2014] ciprofloxacin  400 mg Intravenous Once  . darifenacin  7.5 mg Oral Daily  . enoxaparin (LOVENOX) injection  55 mg Subcutaneous Q24H  . indomethacin  100 mg Rectal Once  . [START ON 02/28/2014] indomethacin  100 mg Rectal Once  . losartan  100 mg Oral Daily  . OLANZapine-FLUoxetine  1 capsule Oral QHS  . pantoprazole  40 mg Oral Q0600  . tetrahydrozoline  2 drop Both Eyes QID  . Vortioxetine HBr  10 mg Oral BH-q7a   Continuous Infusions: . sodium chloride 50 mL/hr at 02/26/14 0600  . sodium chloride       Assessment/Plan:   1. Nausea vomiting transaminitis- due to CBD stone on MRCP, clinically better, GI to do ERCP on 02/28/2014. Defer further management of this problem to GI.   2. Hydrocephalus status post shunt-reprogrammed 7/15 admission-stable-no evidence currently of any concern and no further comment regarding mentation or neurological status as not a part of her current hospital concerns which are purely gastro-intestinal   3. Migraines -stable-no current Headache.  Unrelated to above    4. History bipolar-currently on olanzapine/fluoxetine  each bedtime as well as Vortiextine-.  Await  results of MRCP-medications unlikely precipitant Continue lorazepam 1 mg every 6 when necessary    5. Hypotension on admission-held losartan 100 daily initally-re-started 7/27-secondary to nausea vomiting.  cut back  IV saline 150 cc--50 mL   6. Lactic acidosis- likely due to initial hypertension, Glucophage held, stable trend.   7. Diabetes mellitus type 2 with neuropathy-blood sugar  111-130.  Hold off on sliding scale for now. Continue gabapentin twice a day when necessary.  CBG (last 3)   Recent Labs  02/23/14 2105 02/24/14 0553  GLUCAP 130* 124*    8. Vitamin D deficiency-hold ergocalciferol for now   9. Urinary incontinence-continue VESIcare 10 mg daily     Code Status: Full Family Communication: None present Disposition Plan: Inpatient pending work-up   Thurnell Lose M.D on 02/26/2014 at 12:58 PM  Between 7am to 7pm - Pager - [phone], After 7pm go to [email or web address] - password [email or web address]  And look for the night coverage person covering me after hours  [place]    LOS: 4 days

## 2014-02-26 NOTE — Care Management Note (Unsigned)
    Page 1 of 1   02/26/2014     5:06:11 PM CARE MANAGEMENT NOTE 02/26/2014  Patient:  [name], [name]   Account Number:  [id number]  Date Initiated:  02/26/2014  Documentation initiated by:  Shenise Wolgamott  Subjective/Objective Assessment:   Pt adm on 02/22/14 with hypotension, abd pain, N/V.  PTA, pt independent of ADLs.     Action/Plan:   Will follow for dc needs as pt progresses.   Anticipated DC Date:  02/28/2014   Anticipated DC Plan:  [place]  CM consult      Choice offered to / List presented to:             Status of service:  In process, will continue to follow Medicare Important Message given?  YES (If response is "NO", the following Medicare IM given date fields will be blank) Date Medicare IM given:  02/25/2014 Medicare IM given by:  Lus Kriegel Date Additional Medicare IM given:   Additional Medicare IM given by:    Discharge Disposition:    Per UR Regulation:  Reviewed for med. necessity/level of care/duration of stay  If discussed at [place] of Stay Meetings, dates discussed:    Comments:

## 2014-02-27 ENCOUNTER — Encounter (HOSPITAL_COMMUNITY)
Admission: EM | Disposition: A | Discharge status: Home / Self Care | Payer: Self-pay | Source: Home / Self Care | Attending: Internal Medicine

## 2014-02-27 DIAGNOSIS — R1310 Dysphagia, unspecified: Secondary | ICD-10-CM | POA: Diagnosis present

## 2014-02-27 LAB — COMPREHENSIVE METABOLIC PANEL
ALT: 115 U/L — ABNORMAL HIGH (ref 0–35)
AST: 116 U/L — ABNORMAL HIGH (ref 0–37)
Albumin: 2.9 g/dL — ABNORMAL LOW (ref 3.5–5.2)
Alkaline Phosphatase: 214 U/L — ABNORMAL HIGH (ref 39–117)
Anion gap: 15 (ref 5–15)
BUN: 6 mg/dL (ref 6–23)
CO2: 24 mEq/L (ref 19–32)
Calcium: 8.7 mg/dL (ref 8.4–10.5)
Chloride: 104 mEq/L (ref 96–112)
Creatinine, Ser: 0.81 mg/dL (ref 0.50–1.10)
GFR calc Af Amer: 85 mL/min — ABNORMAL LOW (ref >90)
GFR calc non Af Amer: 73 mL/min — ABNORMAL LOW (ref >90)
Glucose, Bld: 105 mg/dL — ABNORMAL HIGH (ref 70–99)
Potassium: 3.1 mEq/L — ABNORMAL LOW (ref 3.7–5.3)
Sodium: 143 mEq/L (ref 137–147)
Total Bilirubin: 1.2 mg/dL (ref 0.3–1.2)
Total Protein: 6.5 g/dL (ref 6.0–8.3)

## 2014-02-27 LAB — CBC
HCT: 39.1 % (ref 36.0–46.0)
Hemoglobin: 12.9 g/dL (ref 12.0–15.0)
MCH: 30.9 pg (ref 26.0–34.0)
MCHC: 33 g/dL (ref 30.0–36.0)
MCV: 93.5 fL (ref 78.0–100.0)
Platelets: 246 10*3/uL (ref 150–400)
RBC: 4.18 MIL/uL (ref 3.87–5.11)
RDW: 14.6 % (ref 11.5–15.5)
WBC: 5 10*3/uL (ref 4.0–10.5)

## 2014-02-27 LAB — PROTIME-INR
INR: 1.22 (ref 0.00–1.49)
Prothrombin Time: 15.4 seconds — ABNORMAL HIGH (ref 11.6–15.2)

## 2014-02-27 LAB — IGG: IgG (Immunoglobin G), Serum: 1400 mg/dL (ref 690–1700)

## 2014-02-27 SURGERY — ERCP, WITH INTERVENTION IF INDICATED
Anesthesia: Monitor Anesthesia Care

## 2014-02-27 MED ORDER — POTASSIUM CHLORIDE CRYS ER 20 MEQ PO TBCR
40.0000 meq | EXTENDED_RELEASE_TABLET | ORAL | Status: AC
  Administered 2014-02-27 (×2): 40 meq via ORAL
  Filled 2014-02-27 (×2): qty 2

## 2014-02-27 MED ORDER — INDOMETHACIN 50 MG RE SUPP
100.0000 mg | Freq: Once | RECTAL | Status: AC
  Administered 2014-02-28: 100 mg via RECTAL
  Filled 2014-02-27 (×2): qty 2

## 2014-02-27 MED ORDER — SODIUM CHLORIDE 0.9 % IV SOLN
INTRAVENOUS | Status: AC
  Administered 2014-02-27: 23:00:00 via INTRAVENOUS

## 2014-02-27 NOTE — Progress Notes (Signed)
          Daily Rounding Note  02/27/2014, 12:03 PM  LOS: 5 days   SUBJECTIVE:       Continues pain, nausea free.  C/o lack of salt in her meals.    OBJECTIVE:         Vital signs in last 24 hours:    Temp:  [98.1 F (36.7 C)-99.2 F (37.3 C)] 98.1 F (36.7 C) (07/29 0506) Pulse Rate:  [72-83] 74 (07/29 0849) Resp:  [18] 18 (07/29 0506) BP: (152-171)/(71-99) 152/71 mmHg (07/29 0849) SpO2:  [93 %-95 %] 95 % (07/29 0506) Last BM Date: 02/26/14 General: looks well.  comfortable   Heart: RRR Chest: clear bil Abdomen: soft, NT, ND, obese  Extremities: no CCE Neuro/Psych:  Intelligent, appropriate, fully oriented, no limb weakness.    Lab Results:  Recent Labs  02/25/14 0510 02/27/14 0342  WBC 4.5 5.0  HGB 12.7 12.9  HCT 38.9 39.1  PLT 235 246   BMET  Recent Labs  02/25/14 0510 02/27/14 0342  NA 140 143  K 3.9 3.1*  CL 102 104  CO2 24 24  GLUCOSE 111* 105*  BUN 7 6  CREATININE 0.70 0.81  CALCIUM 8.5 8.7   LFT  Recent Labs  02/25/14 0510 02/27/14 0342  PROT 6.9 6.5  ALBUMIN 2.9* 2.9*  AST 197* 116*  ALT 161* 115*  ALKPHOS 247* 214*  BILITOT 1.5* 1.2   PT/INR  Recent Labs  02/27/14 0342  LABPROT 15.4*  INR 1.22    ASSESMENT:   * Abnormal LFTs. Fatty liver. Borderline splenomegaly. Prior cholecystectomy 2005 and pneumobilia. Normal intrahepatic ducts, normal pancreatic duct. Hepatitis ABC serologies negative. Likely had previous ERCP and associated pancreatitis but no records found.  Looks to have distal CBD stone on MRCP as well as periampllary diverticulum and trace edema and adenopathy but no mass at porta.  LFTs improving.  ERCP set for 7/30.   * Vomiting, x 2 weeks. Nauseated then vomits quickly and feels better. This resolved.  * Dysphagia.  * S/p VP shunt.      PLAN   *  EGD, possible dilation ERCP 0930 tomorrow. The risks and benefits as well as alternatives of endoscopic  procedure(s) have been discussed and reviewed. All questions answered. The patient agrees to proceed.  *  Needs prophylactic Indocin PR given hx of ERCP related pancreatitis in 2005.  *  Change to regular diet.  *  Stop Lovenox.     Azucena Freed  02/27/2014, 12:03 PM Pager: [phone]  [facility] Attending  I have also seen and assessed the patient and agree with the above note. Gatha Mayer, MD, [facility] [phone] (pager) 02/27/2014 3:56 PM

## 2014-02-27 NOTE — Progress Notes (Signed)
Note: This document was prepared with digital dictation and possible smart phrase technology. Any transcriptional errors that result from this process are unintentional.   [name] ERD:[id number] DOB: [birth year] DOA: 02/22/2014 PCP: [name], MD  Brief narrative: 68 y/o ?, recent admit 7/15-7/17 near syncope, known hydrocephalus s/p shunt [reprogrammed 7/15 admission], migraines, recent falls, Bipolar, htn., dm ty 2 admitted with hypotension, liver/renal failure and lactic acidosis.  Past medical history-As per Problem list Chart reviewed as below- reviewed  Consultants:  Gi , N surg  Procedures:  MRCP, ERCP scheduled 02-28-14  Antibiotics:  none   Subjective   Patient and bed, no headache, no fever chills. No chest abdominal pain today. No nausea vomiting. No focal weakness.   Objective        Filed Vitals:   02/26/14 0829 02/26/14 2028 02/27/14 0506 02/27/14 0849  BP: 157/69 171/99 152/78 152/71  Pulse: 74 83 72 74  Temp:  99.2 F (37.3 C) 98.1 F (36.7 C)   TempSrc:  Oral Oral   Resp:  18 18   Height:      Weight:      SpO2:  93% 95%    No intake or output data in the 24 hours ending 02/27/14 1058  Exam:  General: Alert pleasant oriented not in distress Cardiovascular: S1-S2 no murmur rub or gallop Respiratory: Clinically clear Abdomen: Soft nontender currently  Data Reviewed: Basic Metabolic Panel:  Recent Labs Lab 02/22/14 1320 02/22/14 1820 02/23/14 0406 02/24/14 0547 02/25/14 0510 02/27/14 0342  NA 139  --  143 137 140 143  K 4.0  --  4.2 3.8 3.9 3.1*  CL 99  --  104 102 102 104  CO2 21  --  25 22 24 24   GLUCOSE 201*  --  130* 127* 111* 105*  BUN 23  --  18 8 7 6   CREATININE 1.55*  --  1.13* 0.75 0.70 0.81  CALCIUM 8.6  --  8.7 8.4 8.5 8.7  MG  --  1.9  --   --   --   --    Liver Function Tests:  Recent Labs Lab 02/22/14 1320 02/23/14 0406 02/24/14 0547 02/25/14 0510 02/27/14 0342  AST 315* 346* 267* 197* 116*    ALT 202* 218* 189* 161* 115*  ALKPHOS 282* 258* 247* 247* 214*  BILITOT 1.9* 1.6* 1.7* 1.5* 1.2  PROT 7.0 6.6 6.6 6.9 6.5  ALBUMIN 2.9* 2.9* 2.8* 2.9* 2.9*    Recent Labs Lab 02/22/14 1320  LIPASE 19   No results found for this basename: AMMONIA,  in the last 168 hours CBC:  Recent Labs Lab 02/22/14 1320 02/23/14 0406 02/24/14 0547 02/25/14 0510 02/27/14 0342  WBC 5.3 5.5 4.2 4.5 5.0  NEUTROABS 4.0  --  2.3 2.4  --   HGB 12.5 12.5 12.2 12.7 12.9  HCT 39.0 39.1 37.3 38.9 39.1  MCV 97.0 95.8 93.3 93.1 93.5  PLT 205 211 205 235 246   Cardiac Enzymes:  Recent Labs Lab 02/22/14 2000  CKTOTAL 243*   BNP: No components found with this basename: POCBNP,  CBG:  Recent Labs Lab 02/22/14 2047 02/23/14 0615 02/23/14 1203 02/23/14 2105 02/24/14 0553  GLUCAP 142* 167* 167* 130* 124*    Recent Results (from the past 240 hour(s))  CULTURE, BLOOD (ROUTINE X 2)     Status: None   Collection Time    02/22/14  1:00 PM      Result Value Ref Range Status  Specimen Description BLOOD RIGHT ANTECUBITAL   Final   Special Requests BOTTLES DRAWN AEROBIC AND ANAEROBIC 10CC   Final   Culture  Setup Time     Final   Value: 02/22/2014 17:30     Performed at [facility]   Culture     Final   Value:        BLOOD CULTURE RECEIVED NO GROWTH TO DATE CULTURE WILL BE HELD FOR 5 DAYS BEFORE ISSUING A FINAL NEGATIVE REPORT     Performed at [facility]   Report Status PENDING   Incomplete  CULTURE, BLOOD (ROUTINE X 2)     Status: None   Collection Time    02/22/14  1:20 PM      Result Value Ref Range Status   Specimen Description BLOOD RIGHT HAND   Final   Special Requests BOTTLES DRAWN AEROBIC AND ANAEROBIC 10CC   Final   Culture  Setup Time     Final   Value: 02/22/2014 17:29     Performed at [facility]   Culture     Final   Value:        BLOOD CULTURE RECEIVED NO GROWTH TO DATE CULTURE WILL BE HELD FOR 5 DAYS BEFORE ISSUING A FINAL NEGATIVE REPORT      Performed at [facility]   Report Status PENDING   Incomplete  URINE CULTURE     Status: None   Collection Time    02/22/14  2:54 PM      Result Value Ref Range Status   Specimen Description URINE, CLEAN CATCH   Final   Special Requests NONE   Final   Culture  Setup Time     Final   Value: 02/22/2014 16:01     Performed at [place]     Final   Value: 75,000 COLONIES/ML     Performed at [facility]   Culture     Final   Value: Multiple bacterial morphotypes present, none predominant. Suggest appropriate recollection if clinically indicated.     Performed at [facility]   Report Status 02/23/2014 FINAL   Final     Studies:              All Imaging reviewed and is as per above notation   Scheduled Meds: . [START ON 02/28/2014] ciprofloxacin  400 mg Intravenous Once  . darifenacin  7.5 mg Oral Daily  . enoxaparin (LOVENOX) injection  55 mg Subcutaneous Q24H  . indomethacin  100 mg Rectal Once  . [START ON 02/28/2014] indomethacin  100 mg Rectal Once  . losartan  100 mg Oral Daily  . OLANZapine-FLUoxetine  1 capsule Oral QHS  . pantoprazole  40 mg Oral Q0600  . tetrahydrozoline  2 drop Both Eyes QID  . Vortioxetine HBr  10 mg Oral BH-q7a   Continuous Infusions: . sodium chloride 50 mL/hr at 02/26/14 0600  . sodium chloride       Assessment/Plan:   1. Nausea vomiting transaminitis- due to CBD stone on MRCP, clinically better, GI to do ERCP on 02/28/2014. Defer further management of this problem to GI.   2. Hydrocephalus status post shunt-reprogrammed 7/15 admission-stable-no evidence currently of any concern and no further comment regarding mentation or neurological status as not a part of her current hospital concerns which are purely gastro-intestinal, seen by neurosurgeon Dr. Annette Stable this admission.   3. Migraines -stable-no current Headache.  Unrelated to above  4. History bipolar-currently on  olanzapine/fluoxetine each bedtime as well as Vortiextine-.  Await results of MRCP-medications unlikely precipitant Continue lorazepam 1 mg every 6 when necessary    5. Hypotension on admission-held losartan 100 daily initally-re-started 7/27-secondary to nausea vomiting.  cut back  IV saline 150 cc--50 mL   6. Lactic acidosis- likely due to initial hypertension, Glucophage held, stable trend.   7. Diabetes mellitus type 2 with neuropathy-blood sugar  111-130.  Hold off on sliding scale for now. Continue gabapentin twice a day when necessary.  CBG (last 3)  No results found for this basename: GLUCAP,  in the last 72 hours  8. Vitamin D deficiency-hold ergocalciferol for now   9. Urinary incontinence-continue VESIcare 10 mg daily     Code Status: Full Family Communication: None present Disposition Plan: Inpatient pending work-up   Thurnell Lose M.D on 02/27/2014 at 10:58 AM  Between 7am to 7pm - Pager - [phone], After 7pm go to [email or web address] - password [email or web address]  And look for the night coverage person covering me after hours  [place]    LOS: 5 days

## 2014-02-28 ENCOUNTER — Encounter (HOSPITAL_COMMUNITY): Payer: Self-pay | Admitting: *Deleted

## 2014-02-28 ENCOUNTER — Encounter: Payer: Self-pay | Admitting: Gastroenterology

## 2014-02-28 ENCOUNTER — Inpatient Hospital Stay (HOSPITAL_COMMUNITY): Payer: PRIVATE HEALTH INSURANCE | Admitting: Anesthesiology

## 2014-02-28 ENCOUNTER — Encounter (HOSPITAL_COMMUNITY): Payer: PRIVATE HEALTH INSURANCE | Admitting: Anesthesiology

## 2014-02-28 ENCOUNTER — Inpatient Hospital Stay (HOSPITAL_COMMUNITY): Payer: PRIVATE HEALTH INSURANCE

## 2014-02-28 ENCOUNTER — Ambulatory Visit: Payer: 59 | Admitting: Internal Medicine

## 2014-02-28 ENCOUNTER — Encounter (HOSPITAL_COMMUNITY)
Admission: EM | Disposition: A | Discharge status: Home / Self Care | Payer: Self-pay | Source: Home / Self Care | Attending: Internal Medicine

## 2014-02-28 DIAGNOSIS — Z0289 Encounter for other administrative examinations: Secondary | ICD-10-CM

## 2014-02-28 HISTORY — PX: ERCP: SHX5425

## 2014-02-28 HISTORY — PX: ESOPHAGOGASTRODUODENOSCOPY: SHX5428

## 2014-02-28 LAB — CULTURE, BLOOD (ROUTINE X 2)
Culture: NO GROWTH
Culture: NO GROWTH

## 2014-02-28 LAB — COMPREHENSIVE METABOLIC PANEL
ALT: 101 U/L — ABNORMAL HIGH (ref 0–35)
AST: 110 U/L — ABNORMAL HIGH (ref 0–37)
Albumin: 2.8 g/dL — ABNORMAL LOW (ref 3.5–5.2)
Alkaline Phosphatase: 241 U/L — ABNORMAL HIGH (ref 39–117)
Anion gap: 14 (ref 5–15)
BUN: 6 mg/dL (ref 6–23)
CO2: 24 mEq/L (ref 19–32)
Calcium: 8.7 mg/dL (ref 8.4–10.5)
Chloride: 108 mEq/L (ref 96–112)
Creatinine, Ser: 0.83 mg/dL (ref 0.50–1.10)
GFR calc Af Amer: 83 mL/min — ABNORMAL LOW (ref >90)
GFR calc non Af Amer: 71 mL/min — ABNORMAL LOW (ref >90)
Glucose, Bld: 112 mg/dL — ABNORMAL HIGH (ref 70–99)
Potassium: 3.8 mEq/L (ref 3.7–5.3)
Sodium: 146 mEq/L (ref 137–147)
Total Bilirubin: 1.1 mg/dL (ref 0.3–1.2)
Total Protein: 6.4 g/dL (ref 6.0–8.3)

## 2014-02-28 LAB — CBC
HCT: 39.2 % (ref 36.0–46.0)
Hemoglobin: 12.9 g/dL (ref 12.0–15.0)
MCH: 31 pg (ref 26.0–34.0)
MCHC: 32.9 g/dL (ref 30.0–36.0)
MCV: 94.2 fL (ref 78.0–100.0)
Platelets: 254 10*3/uL (ref 150–400)
RBC: 4.16 MIL/uL (ref 3.87–5.11)
RDW: 14.7 % (ref 11.5–15.5)
WBC: 4.9 10*3/uL (ref 4.0–10.5)

## 2014-02-28 LAB — MAGNESIUM: Magnesium: 1.7 mg/dL (ref 1.5–2.5)

## 2014-02-28 SURGERY — ERCP, WITH INTERVENTION IF INDICATED
Anesthesia: General

## 2014-02-28 MED ORDER — FENTANYL CITRATE 0.05 MG/ML IJ SOLN: 25.0000 ug | INTRAMUSCULAR | Status: DC | PRN

## 2014-02-28 MED ORDER — GLYCOPYRROLATE 0.2 MG/ML IJ SOLN
INTRAMUSCULAR | Status: DC | PRN
  Administered 2014-02-28: .7 mg via INTRAVENOUS

## 2014-02-28 MED ORDER — NEOSTIGMINE METHYLSULFATE 10 MG/10ML IV SOLN
INTRAVENOUS | Status: DC | PRN
  Administered 2014-02-28: 4 mg via INTRAVENOUS

## 2014-02-28 MED ORDER — VECURONIUM BROMIDE 10 MG IV SOLR
INTRAVENOUS | Status: DC | PRN
  Administered 2014-02-28: 4 mg via INTRAVENOUS

## 2014-02-28 MED ORDER — FENTANYL CITRATE 0.05 MG/ML IJ SOLN
INTRAMUSCULAR | Status: DC | PRN
  Administered 2014-02-28: 100 ug via INTRAVENOUS
  Administered 2014-02-28: 50 ug via INTRAVENOUS

## 2014-02-28 MED ORDER — OXYCODONE HCL 5 MG/5ML PO SOLN: 5.0000 mg | Freq: Once | ORAL | Status: DC | PRN

## 2014-02-28 MED ORDER — ONDANSETRON HCL 4 MG/2ML IJ SOLN: 4.0000 mg | Freq: Four times a day (QID) | INTRAMUSCULAR | Status: DC | PRN

## 2014-02-28 MED ORDER — LACTATED RINGERS IV SOLN
INTRAVENOUS | Status: DC | PRN
  Administered 2014-02-28: 09:00:00 via INTRAVENOUS

## 2014-02-28 MED ORDER — ONDANSETRON HCL 4 MG/2ML IJ SOLN
INTRAMUSCULAR | Status: DC | PRN
  Administered 2014-02-28: 4 mg via INTRAVENOUS

## 2014-02-28 MED ORDER — OXYCODONE HCL 5 MG PO TABS: 5.0000 mg | ORAL_TABLET | Freq: Once | ORAL | Status: DC | PRN

## 2014-02-28 MED ORDER — SUCCINYLCHOLINE CHLORIDE 20 MG/ML IJ SOLN
INTRAMUSCULAR | Status: DC | PRN
  Administered 2014-02-28: 120 mg via INTRAVENOUS

## 2014-02-28 MED ORDER — SODIUM CHLORIDE 0.9 % IV SOLN
INTRAVENOUS | Status: DC | PRN
  Administered 2014-02-28: 10:00:00

## 2014-02-28 MED ORDER — PROPOFOL 10 MG/ML IV BOLUS
INTRAVENOUS | Status: DC | PRN
  Administered 2014-02-28: 150 mg via INTRAVENOUS

## 2014-02-28 MED ORDER — LACTATED RINGERS IV SOLN
INTRAVENOUS | Status: DC
  Administered 2014-02-28: 1000 mL via INTRAVENOUS

## 2014-02-28 MED ORDER — LIDOCAINE HCL (CARDIAC) 20 MG/ML IV SOLN
INTRAVENOUS | Status: DC | PRN
  Administered 2014-02-28: 75 mg via INTRAVENOUS

## 2014-02-28 NOTE — Transfer of Care (Signed)
Immediate Anesthesia Transfer of Care Note  Patient: [name]  Procedure(s) Performed: Procedure(s) with comments: ENDOSCOPIC RETROGRADE CHOLANGIOPANCREATOGRAPHY (ERCP) (N/A) - talked to tiffany from or /ebp ESOPHAGOGASTRODUODENOSCOPY (EGD) (N/A)  Patient Location: PACU  Anesthesia Type:General  Level of Consciousness: awake, alert  and oriented  Airway & Oxygen Therapy: Patient Spontanous Breathing and Patient connected to nasal cannula oxygen  Post-op Assessment: Report given to PACU RN and Post -op Vital signs reviewed and stable  Post vital signs: Reviewed and stable  Complications: No apparent anesthesia complications

## 2014-02-28 NOTE — Op Note (Signed)
[facility], [id number]   ERCP PROCEDURE REPORT  PATIENT: Kiara, Wolf.  MR# :[id number] BIRTHDATE: 1945-12-31  GENDER: Female ENDOSCOPIST: Gatha Mayer, MD, [facility] PROCEDURE DATE:  02/28/2014 PROCEDURE:   ERCP with sphincterotomy/papillotomy and ERCP with removal of calculus/calculi ASA CLASS:   Class III INDICATIONS:established bile duct stone(s). MEDICATIONS: See Anesthesia Report. TOPICAL ANESTHETIC: none  DESCRIPTION OF PROCEDURE:   After the risks benefits and alternatives of the procedure were thoroughly explained, informed consent was obtained.  The gastroscop[e and  then duodenoscope were introduced through the mouth  and advanced to the second portion of the duodenum .  The upper, middle and distal third of the esophagus were carefully inspected and no abnormalities were noted.  The z-line was well seen at the GEJ.  The endoscope was pushed into the fundus which was normal.  The antrum, gastric body, first and second part of the duodenum were unremarkable.  There was a medium per-ampullary diverticulum. Major papilla seen , noovert evidence prior sphincterotomy but could tell when cannulated. free cannulation of bile ducts showed a mobile round 8-10 mm stone in a mildly dilated CBD/CHD with NL intrahepatics and no gallbladder after rescetion. The sphincterotome was used over a wire and the biliary sphincterotomy was extended. Then a balloon was used to extract the stone. No other stones on occlusion cholangiogram. No pancreatogram.  The scope was then completely withdrawn from the patient and the procedure terminated.   COMPLICATIONS: .  There were no complications.  ENDOSCOPIC IMPRESSION: 1.   Normal EGD - no cause of dysphagia 2.   There was a medium sized periampullary diverticulum. 3.   Common bile duct stone removed  RECOMMENDATIONS: Clears until tomorrow Getting prophylactic indomethacin to  reduce risk of pancreatitis Follow-up GI as outpatient - see Dr. Fuller Plan in f/u in a few weeks and can be considered for a screening colonoscopy also If she has recurrent stone formation would add ursodeoxycholic acid eSigned:  Gatha Mayer, MD, [facility] 02/28/2014 10:56 AM

## 2014-02-28 NOTE — Progress Notes (Signed)
Note: This document was prepared with digital dictation and possible smart phrase technology. Any transcriptional errors that result from this process are unintentional.   [name]:[id number] DOB: Mar 31, 1946 DOA: 02/22/2014 PCP: [name], MD  Brief narrative: 68 y/o ?, recent admit 7/15-7/17 near syncope, known hydrocephalus s/p shunt [reprogrammed 7/15 admission], migraines, recent falls, Bipolar, htn., dm ty 2 admitted with hypotension, liver/renal failure and lactic acidosis.  Past medical history-As per Problem list Chart reviewed as below- reviewed  Consultants:  Gi , N surg  Procedures:  MRCP, ERCP  02-28-14  Antibiotics:  none   Subjective   Patient and bed, no headache, no fever chills. No chest abdominal pain today. No nausea vomiting. No focal weakness.   Objective        Filed Vitals:   02/28/14 0853 02/28/14 1042 02/28/14 1052 02/28/14 1100  BP: 199/79 150/81 160/78 166/81  Pulse: 89  75 71  Temp: 97.8 F (36.6 C) 98.3 F (36.8 C)    TempSrc: Oral Oral    Resp: 19 21 23 20   Height:      Weight:      SpO2: 91% 92% 96% 94%    Intake/Output Summary (Last 24 hours) at 02/28/14 1413 Last data filed at 02/28/14 1240  Gross per 24 hour  Intake    990 ml  Output      0 ml  Net    990 ml    Exam:  General: Alert pleasant oriented not in distress Cardiovascular: S1-S2 no murmur rub or gallop Respiratory: Clinically clear Abdomen: Soft nontender currently  Data Reviewed: Basic Metabolic Panel:  Recent Labs Lab 02/22/14 1320 02/22/14 1820 02/23/14 0406 02/24/14 0547 02/25/14 0510 02/27/14 0342 02/28/14 0552  NA 139  --  143 137 140 143 146  K 4.0  --  4.2 3.8 3.9 3.1* 3.8  CL 99  --  104 102 102 104 108  CO2 21  --  25 22 24 24 24   GLUCOSE 201*  --  130* 127* 111* 105* 112*  BUN 23  --  18 8 7 6 6   CREATININE 1.55*  --  1.13* 0.75 0.70 0.81 0.83  CALCIUM 8.6  --  8.7 8.4 8.5 8.7 8.7  MG  --  1.9  --   --   --   --   1.7   Liver Function Tests:  Recent Labs Lab 02/23/14 0406 02/24/14 0547 02/25/14 0510 02/27/14 0342 02/28/14 0552  AST 346* 267* 197* 116* 110*  ALT 218* 189* 161* 115* 101*  ALKPHOS 258* 247* 247* 214* 241*  BILITOT 1.6* 1.7* 1.5* 1.2 1.1  PROT 6.6 6.6 6.9 6.5 6.4  ALBUMIN 2.9* 2.8* 2.9* 2.9* 2.8*    Recent Labs Lab 02/22/14 1320  LIPASE 19   No results found for this basename: AMMONIA,  in the last 168 hours CBC:  Recent Labs Lab 02/22/14 1320 02/23/14 0406 02/24/14 0547 02/25/14 0510 02/27/14 0342 02/28/14 0552  WBC 5.3 5.5 4.2 4.5 5.0 4.9  NEUTROABS 4.0  --  2.3 2.4  --   --   HGB 12.5 12.5 12.2 12.7 12.9 12.9  HCT 39.0 39.1 37.3 38.9 39.1 39.2  MCV 97.0 95.8 93.3 93.1 93.5 94.2  PLT 205 211 205 235 246 254   Cardiac Enzymes:  Recent Labs Lab 02/22/14 2000  CKTOTAL 243*   BNP: No components found with this basename: POCBNP,  CBG:  Recent Labs Lab 02/22/14 2047 02/23/14 0615 02/23/14 1203 02/23/14 2105  02/24/14 0553  GLUCAP 142* 167* 167* 130* 124*    Recent Results (from the past 240 hour(s))  CULTURE, BLOOD (ROUTINE X 2)     Status: None   Collection Time    02/22/14  1:00 PM      Result Value Ref Range Status   Specimen Description BLOOD RIGHT ANTECUBITAL   Final   Special Requests BOTTLES DRAWN AEROBIC AND ANAEROBIC 10CC   Final   Culture  Setup Time     Final   Value: 02/22/2014 17:30     Performed at [facility]   Culture     Final   Value: NO GROWTH 5 DAYS     Performed at [facility]   Report Status 02/28/2014 FINAL   Final  CULTURE, BLOOD (ROUTINE X 2)     Status: None   Collection Time    02/22/14  1:20 PM      Result Value Ref Range Status   Specimen Description BLOOD RIGHT HAND   Final   Special Requests BOTTLES DRAWN AEROBIC AND ANAEROBIC 10CC   Final   Culture  Setup Time     Final   Value: 02/22/2014 17:29     Performed at [facility]   Culture     Final   Value: NO GROWTH 5 DAYS      Performed at [facility]   Report Status 02/28/2014 FINAL   Final  URINE CULTURE     Status: None   Collection Time    02/22/14  2:54 PM      Result Value Ref Range Status   Specimen Description URINE, CLEAN CATCH   Final   Special Requests NONE   Final   Culture  Setup Time     Final   Value: 02/22/2014 16:01     Performed at [facility]     Final   Value: 75,000 COLONIES/ML     Performed at [facility]   Culture     Final   Value: Multiple bacterial morphotypes present, none predominant. Suggest appropriate recollection if clinically indicated.     Performed at [facility]   Report Status 02/23/2014 FINAL   Final     Studies:              All Imaging reviewed and is as per above notation   Scheduled Meds: . ciprofloxacin  400 mg Intravenous Once  . darifenacin  7.5 mg Oral Daily  . losartan  100 mg Oral Daily  . OLANZapine-FLUoxetine  1 capsule Oral QHS  . pantoprazole  40 mg Oral Q0600  . tetrahydrozoline  2 drop Both Eyes QID  . Vortioxetine HBr  10 mg Oral BH-q7a   Continuous Infusions:     Assessment/Plan:   1. Nausea vomiting transaminitis- due to CBD stone on MRCP, clinically better, post ERCP on 02/28/2014. On clears and NSAIDs to reduce Pancreatitis chancesDefer further management of this problem to GI.   2. Hydrocephalus status post shunt-reprogrammed 7/15 admission-stable-no evidence currently of any concern and no further comment regarding mentation or neurological status as not a part of her current hospital concerns which are purely gastro-intestinal, seen by neurosurgeon Dr. Annette Stable this admission.   3. Migraines -stable-no current Headache.  Unrelated to above    4. History bipolar-currently on olanzapine/fluoxetine each bedtime as well as Vortiextine-.  Await results of MRCP-medications unlikely precipitant Continue lorazepam 1 mg every 6 when necessary  5. Hypotension on admission-held losartan  100 daily initally-re-started 7/27-secondary to nausea vomiting.  cut back  IV saline 150 cc--50 mL   6. Lactic acidosis- likely due to initial hypertension, Glucophage held, stable trend.   7. Diabetes mellitus type 2 with neuropathy-blood sugar  111-130.  Hold off on sliding scale for now. Continue gabapentin twice a day when necessary.  CBG (last 3)  No results found for this basename: GLUCAP,  in the last 72 hours  8. Vitamin D deficiency-hold ergocalciferol for now   9. Urinary incontinence-continue VESIcare 10 mg daily     Code Status: Full Family Communication: None present Disposition Plan: Inpatient pending work-up   Thurnell Lose M.D on 02/28/2014 at 2:13 PM  Between 7am to 7pm - Pager - [phone], After 7pm go to [email or web address] - password [email or web address]  And look for the night coverage person covering me after hours  [place]    LOS: 6 days

## 2014-02-28 NOTE — Anesthesia Procedure Notes (Signed)
Procedure Name: Intubation Date/Time: 02/28/2014 9:56 AM Performed by: Eligha Bridegroom Pre-anesthesia Checklist: Emergency Drugs available, Timeout performed, Patient identified, Patient being monitored and Suction available Patient Re-evaluated:Patient Re-evaluated prior to inductionOxygen Delivery Method: Circle system utilized Preoxygenation: Pre-oxygenation with 100% oxygen Intubation Type: IV induction, Rapid sequence and Cricoid Pressure applied Laryngoscope Size: Mac and 3 Grade View: Grade II Tube type: Oral Tube size: 7.0 mm Number of attempts: 1 Airway Equipment and Method: Stylet Placement Confirmation: ETT inserted through vocal cords under direct vision,  breath sounds checked- equal and bilateral and positive ETCO2 Secured at: 22 cm Tube secured with: Tape Dental Injury: Teeth and Oropharynx as per pre-operative assessment

## 2014-02-28 NOTE — Anesthesia Preprocedure Evaluation (Signed)
Anesthesia Evaluation  Patient identified by MRN, date of birth, ID band Patient awake    Reviewed: Allergy & Precautions, H&P , NPO status , Patient's Chart, lab work & pertinent test results  Airway Mallampati: II  Neck ROM: full    Dental   Pulmonary          Cardiovascular hypertension,     Neuro/Psych PSYCHIATRIC DISORDERS Anxiety Depression    GI/Hepatic CBD stone   Endo/Other  diabetes, Type 2Morbid obesity  Renal/GU      Musculoskeletal   Abdominal   Peds  Hematology   Anesthesia Other Findings   Reproductive/Obstetrics                           Anesthesia Physical Anesthesia Plan  ASA: II  Anesthesia Plan: General   Post-op Pain Management:    Induction: Intravenous  Airway Management Planned: Oral ETT  Additional Equipment:   Intra-op Plan:   Post-operative Plan: Extubation in OR  Informed Consent: I have reviewed the patients History and Physical, chart, labs and discussed the procedure including the risks, benefits and alternatives for the proposed anesthesia with the patient or authorized representative who has indicated his/her understanding and acceptance.     Plan Discussed with: CRNA, Anesthesiologist and Surgeon  Anesthesia Plan Comments:         Anesthesia Quick Evaluation

## 2014-03-01 DIAGNOSIS — I959 Hypotension, unspecified: Secondary | ICD-10-CM

## 2014-03-01 LAB — COMPREHENSIVE METABOLIC PANEL
ALT: 86 U/L — ABNORMAL HIGH (ref 0–35)
AST: 96 U/L — ABNORMAL HIGH (ref 0–37)
Albumin: 2.9 g/dL — ABNORMAL LOW (ref 3.5–5.2)
Alkaline Phosphatase: 218 U/L — ABNORMAL HIGH (ref 39–117)
Anion gap: 12 (ref 5–15)
BUN: 6 mg/dL (ref 6–23)
CO2: 26 mEq/L (ref 19–32)
Calcium: 8.9 mg/dL (ref 8.4–10.5)
Chloride: 106 mEq/L (ref 96–112)
Creatinine, Ser: 0.81 mg/dL (ref 0.50–1.10)
GFR calc Af Amer: 85 mL/min — ABNORMAL LOW (ref >90)
GFR calc non Af Amer: 73 mL/min — ABNORMAL LOW (ref >90)
Glucose, Bld: 114 mg/dL — ABNORMAL HIGH (ref 70–99)
Potassium: 3.9 mEq/L (ref 3.7–5.3)
Sodium: 144 mEq/L (ref 137–147)
Total Bilirubin: 1.1 mg/dL (ref 0.3–1.2)
Total Protein: 6.4 g/dL (ref 6.0–8.3)

## 2014-03-01 LAB — CBC
HCT: 38.1 % (ref 36.0–46.0)
Hemoglobin: 12.8 g/dL (ref 12.0–15.0)
MCH: 31.8 pg (ref 26.0–34.0)
MCHC: 33.6 g/dL (ref 30.0–36.0)
MCV: 94.5 fL (ref 78.0–100.0)
Platelets: 236 10*3/uL (ref 150–400)
RBC: 4.03 MIL/uL (ref 3.87–5.11)
RDW: 14.6 % (ref 11.5–15.5)
WBC: 4.5 10*3/uL (ref 4.0–10.5)

## 2014-03-01 LAB — AMYLASE: Amylase: 45 U/L (ref 0–105)

## 2014-03-01 LAB — LIPASE, BLOOD: Lipase: 42 U/L (ref 11–59)

## 2014-03-01 MED ORDER — METFORMIN HCL 500 MG PO TABS: 500.0000 mg | ORAL_TABLET | Freq: Every day | ORAL | Status: DC

## 2014-03-01 NOTE — Discharge Instructions (Signed)
Follow with Primary MD [name], MD in 7 days   Get CBC, CMP,  checked  by Primary MD next visit.    Activity: As tolerated with Full fall precautions use walker/cane & assistance as needed   Disposition Home     Diet: Heart Healthy Low carb.  For Heart failure patients - Check your Weight same time everyday, if you gain over 2 pounds, or you develop in leg swelling, experience more shortness of breath or chest pain, call your Primary MD immediately. Follow Cardiac Low Salt Diet and 1.8 lit/day fluid restriction.   On your next visit with her primary care physician please Get Medicines reviewed and adjusted.  Please request your Prim.MD to go over all Hospital Tests and Procedure/Radiological results at the follow up, please get all Hospital records sent to your Prim MD by signing hospital release before you go home.   If you experience worsening of your admission symptoms, develop shortness of breath, life threatening emergency, suicidal or homicidal thoughts you must seek medical attention immediately by calling 911 or calling your MD immediately  if symptoms less severe.  You Must read complete instructions/literature along with all the possible adverse reactions/side effects for all the Medicines you take and that have been prescribed to you. Take any new Medicines after you have completely understood and accpet all the possible adverse reactions/side effects.   Do not drive, operating heavy machinery, perform activities at heights, swimming or participation in water activities or provide baby sitting services if your were admitted for syncope or siezures until you have seen by Primary MD or a Neurologist and advised to do so again.  Do not drive when taking Pain medications.    Do not take more than prescribed Pain, Sleep and Anxiety Medications  Special Instructions: If you have smoked or chewed Tobacco  in the last 2 yrs please stop smoking, stop any regular Alcohol  and or  any Recreational drug use.  Wear Seat belts while driving.   Please note  You were cared for by a hospitalist during your hospital stay. If you have any questions about your discharge medications or the care you received while you were in the hospital after you are discharged, you can call the unit and asked to speak with the hospitalist on call if the hospitalist that took care of you is not available. Once you are discharged, your primary care physician will handle any further medical issues. Please note that NO REFILLS for any discharge medications will be authorized once you are discharged, as it is imperative that you return to your primary care physician (or establish a relationship with a primary care physician if you do not have one) for your aftercare needs so that they can reassess your need for medications and monitor your lab values.     Metformin and X-ray Contrast Studies For some X-ray exams, a contrast dye is used. Contrast dye is a type of medicine used to make the X-ray image clearer. The contrast dye is given to the patient through a vein (intravenously). If you need to have this type of X-ray exam and you take a medication called metformin, your caregiver may have you stop taking metformin before the exam.  LACTIC ACIDOSIS In rare cases, a serious medical condition called lactic acidosis can develop in people who take metformin and receive contrast dye. The following conditions can increase the risk of this complication:   Kidney failure.  Liver problems.  Certain types of heart  problems such as:  Heart failure.  Heart attack.  Heart infection.  Heart valve problems.  Alcohol abuse. If left untreated, lactic acidosis can lead to coma.  SYMPTOMS OF LACTIC ACIDOSIS Symptoms of lactic acidosis can include:  Rapid breathing (hyperventilation).  Neurologic symptoms such as:  Headaches.  Confusion.  Dizziness.  Excessive sweating.  Feeling sick to your  stomach (nauseous) or throwing up (vomiting). AFTER THE X-RAY EXAM  Stay well-hydrated. Drink fluids as instructed by your caregiver.  If you have a risk of developing lactic acidosis, blood tests may be done to make sure your kidney function is okay.  Metformin is usually stopped for 48 hours after the X-ray exam. Ask your caregiver when you can start taking metformin again. SEEK MEDICAL CARE IF:   You have shortness of breath or difficulty breathing.  You develop a headache that does not go away.  You have nausea or vomiting.  You urinate more than normal.  You develop a skin rash and have:  Redness.  Swelling.  Itching. Document Released: 07/07/2009 Document Revised: 10/11/2011 Document Reviewed: 07/07/2009 [facility] Patient Information 2015 [place], [place]. This information is not intended to replace advice given to you by your health care provider. Make sure you discuss any questions you have with your health care provider.

## 2014-03-01 NOTE — Progress Notes (Signed)
Reviewed DC summary and answered all questions regarding medications and appointments. Patient to go home with family. Glade Nurse, RN

## 2014-03-01 NOTE — Discharge Summary (Signed)
[name], is a 68 y.o. female  DOB 1946/01/18  MRN [id number].  Admission date:  02/22/2014  Admitting Physician  Hosie Poisson, MD  Discharge Date:  03/01/2014   Primary MD  [name], MD  Recommendations for primary care physician for things to follow:   Repeat CBC, CMP and lactic acid level in one week   Admission Diagnosis  Bradycardia [427.89] Elevated lactic acid level [276.2] Hypotension, unspecified hypotension type [458.9] Non-intractable vomiting with nausea, vomiting of unspecified type [787.01]   Discharge Diagnosis  Bradycardia [427.89] Elevated lactic acid level [276.2] Hypotension, unspecified hypotension type [458.9] Non-intractable vomiting with nausea, vomiting of unspecified type [787.01]     Active Problems:   HYDROCEPHALUS, NORMAL PRESSURE   AKI (acute kidney injury)   Hypotension   Fall   Nausea & vomiting   Transaminitis   Nausea and vomiting   Elevated liver function tests   Calculus of bile duct without mention of cholecystitis or obstruction   Dysphagia, unspecified(787.20)      Past Medical History  Diagnosis Date  . Depression   . Anxiety   . Hypertension   . Diabetes mellitus without complication   . MVP (mitral valve prolapse)   . Hydrocephalus     Past Surgical History  Procedure Laterality Date  . Cholecystectomy    . Brain surgery         History of present illness and  Hospital Course:     Kindly see H&P for history of present illness and admission details, please review complete Labs, Consult reports and Test reports for all details in brief  HPI  from the history and physical done on the day of admission   HPI: [name] is a 68 y.o. female with prior h/o hypertension, DM , MCP, anxiety and depression, hydrocephalous with VP shunt lives by her  self comes in for the third time similar complaints, starts with Nausea and vomiting for a few days, feels dizzy and falls. On arrival to ED,she was found to be hypotensive with bp 70/50's. She was given fluid boluses and her BP improved to 110/60's. Her symptoms improved slightly, but her HR remained in low 50's. She denies any fever or chills. She denies coughing. She denies any abdominal pain. She reports persistent migraine headaches since many weeks. She was recently admitted for similar complaints and discharged on 7/17. She reports falling two days ago and last night. She denies any back pain or hip pain, but reports her body feels sore. She denies having diarrhea. She denies any blurry vision, or any focal weakness. On arrival ED, her labs revealed elevated lactic acid and elevated liver function tests. Her CT abdomen and pelvis reveals hepatic steatosis. She was also found to be in acute renal failure. She was referred to [facility] service for admission for the above.     Hospital Course    1. Nausea vomiting transaminitis- due to CBD stone on MRCP, clinically better, post ERCP on 02/28/2014. Completely symptom-free tolerating diet and cleared  for home discharge by GI. Will follow with PCP and GI post discharge in a timely fashion.    2. Hydrocephalus status post shunt-reprogrammed 7/15 admission-stable-no evidence currently of any concern and no further comment regarding mentation or neurological status as not a part of her current hospital concerns which are purely gastro-intestinal, seen by neurosurgeon Dr. Annette Stable this admission. All were Dr. Annette Stable as needed.    3. Migraines -stable-no current Headache. Unrelated to above    4. History bipolar-currently on olanzapine/fluoxetine each bedtime as well as Vortiextine along with the home medications unchanged.    5. Hypotension on admission- due to nausea vomiting from #1 resolved after IV fluids and holding losartan no stable.     6. Lactic  acidosis- likely due to initial hypertension, Glucophage held, stable trend. Resume Glucophage in 2 days as she received IV dye, repeat lactic acid level in one week once all acute issues have resolved to document stability on Glucophage.    7. Diabetes mellitus type 2 with neuropathy-glycemic control appropriate without any sliding scale her medications, commence Glucophage and 2 days along with low carb diet.    9. Urinary incontinence-continue VESIcare 10 mg daily        Discharge Condition: stable   Follow UP  Follow-up Information   Follow up with [name], MD. Schedule an appointment as soon as possible for a visit in 1 week.   Specialty:  Internal Medicine   Contact information:   [address] [address] [phone]       Follow up with Silvano Rusk, MD. Schedule an appointment as soon as possible for a visit in 1 week.   Specialty:  Gastroenterology   Contact information:   [address] [address] [phone]         Discharge Instructions  and  Discharge Medications      Discharge Instructions   Discharge instructions    Complete by:  As directed   Follow with Primary MD [name], MD in 7 days   Get CBC, CMP,  checked  by Primary MD next visit.    Activity: As tolerated with Full fall precautions use walker/cane & assistance as needed   Disposition Home     Diet: Heart Healthy Low carb.  For Heart failure patients - Check your Weight same time everyday, if you gain over 2 pounds, or you develop in leg swelling, experience more shortness of breath or chest pain, call your Primary MD immediately. Follow Cardiac Low Salt Diet and 1.8 lit/day fluid restriction.   On your next visit with her primary care physician please Get Medicines reviewed and adjusted.  Please request your Prim.MD to go over all Hospital Tests and Procedure/Radiological results at the follow up, please get all Hospital records  sent to your Prim MD by signing hospital release before you go home.   If you experience worsening of your admission symptoms, develop shortness of breath, life threatening emergency, suicidal or homicidal thoughts you must seek medical attention immediately by calling 911 or calling your MD immediately  if symptoms less severe.  You Must read complete instructions/literature along with all the possible adverse reactions/side effects for all the Medicines you take and that have been prescribed to you. Take any new Medicines after you have completely understood and accpet all the possible adverse reactions/side effects.   Do not drive, operating heavy machinery, perform activities at heights, swimming or participation in water activities or provide  baby sitting services if your were admitted for syncope or siezures until you have seen by Primary MD or a Neurologist and advised to do so again.  Do not drive when taking Pain medications.    Do not take more than prescribed Pain, Sleep and Anxiety Medications  Special Instructions: If you have smoked or chewed Tobacco  in the last 2 yrs please stop smoking, stop any regular Alcohol  and or any Recreational drug use.  Wear Seat belts while driving.   Please note  You were cared for by a hospitalist during your hospital stay. If you have any questions about your discharge medications or the care you received while you were in the hospital after you are discharged, you can call the unit and asked to speak with the hospitalist on call if the hospitalist that took care of you is not available. Once you are discharged, your primary care physician will handle any further medical issues. Please note that NO REFILLS for any discharge medications will be authorized once you are discharged, as it is imperative that you return to your primary care physician (or establish a relationship with a primary care physician if you do not have one) for your aftercare  needs so that they can reassess your need for medications and monitor your lab values.     Increase activity slowly    Complete by:  As directed             Medication List         BRINTELLIX 10 MG Tabs  Generic drug:  Vortioxetine HBr  Take 10 mg by mouth every morning.     gabapentin 300 MG capsule  Commonly known as:  NEURONTIN  Take 300 mg by mouth 2 (two) times daily as needed (pain).     HYDROcodone-acetaminophen 5-325 MG per tablet  Commonly known as:  NORCO/VICODIN  Take 1 tablet by mouth every 8 (eight) hours as needed.     ibuprofen 200 MG tablet  Commonly known as:  ADVIL,MOTRIN  Take 400 mg by mouth every 6 (six) hours as needed for headache.     LORazepam 1 MG tablet  Commonly known as:  ATIVAN  Take 1 mg by mouth every 6 (six) hours as needed for anxiety. Take up to 4 times qd     losartan 100 MG tablet  Commonly known as:  COZAAR  Take 100 mg by mouth daily.     metFORMIN 500 MG tablet  Commonly known as:  GLUCOPHAGE  Take 1 tablet (500 mg total) by mouth daily with breakfast.  Start taking on:  03/03/2014     OLANZapine-FLUoxetine 6-25 MG per capsule  Commonly known as:  SYMBYAX  Take 1 capsule by mouth at bedtime.     ranitidine 150 MG tablet  Commonly known as:  ZANTAC  Take 150 mg by mouth 3 (three) times daily as needed for heartburn.     solifenacin 10 MG tablet  Commonly known as:  VESICARE  Take 10 mg by mouth daily.     tiZANidine 4 MG tablet  Commonly known as:  ZANAFLEX  Take 4 mg by mouth 2 (two) times daily as needed for muscle spasms.     traMADol 50 MG tablet  Commonly known as:  ULTRAM  Take 1 tablet (50 mg total) by mouth every 6 (six) hours as needed for moderate pain.     VISINE OP  Place 2 drops into both eyes 4 (four) times daily  as needed (dry eyes).     Vitamin D (Ergocalciferol) 50000 UNITS Caps capsule  Commonly known as:  DRISDOL  Take 50,000 Units by mouth once a week. On Monday          Diet and Activity  recommendation: See Discharge Instructions above   Consults obtained - GI   Major procedures and Radiology Reports - PLEASE review detailed and final reports for all details, in brief -     ERCP  Ct Head Wo Contrast  02/22/2014   CLINICAL DATA:  Fall  EXAM: CT HEAD WITHOUT CONTRAST  TECHNIQUE: Contiguous axial images were obtained from the base of the skull through the vertex without intravenous contrast.  COMPARISON:  MRI brain 02/13/2014  FINDINGS: There is no evidence of mass effect, midline shift or extra-axial fluid collections. There is no evidence of a space-occupying lesion or intracranial hemorrhage. There is no evidence of a cortical-based area of acute infarction. There is periventricular white matter low attenuation likely secondary to microangiopathy.  There is a ventriculoperitoneal shunt catheter via a right parietal approach with the tip in unchanged position. The ventricles and sulci are appropriate for the patient's age. The basal cisterns are patent.  Visualized portions of the orbits are unremarkable. The visualized portions of the paranasal sinuses and mastoid air cells are unremarkable.  The osseous structures are unremarkable.  IMPRESSION: 1. No acute intracranial pathology. 2. Stable position of the ventriculoperitoneal shunt catheter.   Electronically Signed   By: Kathreen Devoid   On: 02/22/2014 15:42   Ct Head Wo Contrast  02/13/2014   CLINICAL DATA:  Fall with head and neck pain  EXAM: CT HEAD WITHOUT CONTRAST  CT CERVICAL SPINE WITHOUT CONTRAST  TECHNIQUE: Multidetector CT imaging of the head and cervical spine was performed following the standard protocol without intravenous contrast. Multiplanar CT image reconstructions of the cervical spine were also generated.  COMPARISON:  08/08/2013 head CT  FINDINGS: CT HEAD FINDINGS  Skull and Sinuses:No acute fracture or destructive process. The mastoids, middle ears, and imaged paranasal sinuses are clear.  There is a right parietal  approach ventriculoperitoneal shunt catheter. The catheter is in unchanged position, with tip near the foramen of Monro.  Orbits: Bilateral cataract resection.  Brain: No evidence of acute abnormality, such as acute infarction, hemorrhage, or mass lesion/mass effect. When accounting for differences in scan angle, no convincing change in the ventricular volume. A fatty focus anti dependent within the right lateral ventricle is chronic, dating back to 2013 at least. This could represent fat introduced at the time of catheter placement. There is generalized brain atrophy, age advanced. Stable low-attenuation around the lateral ventricles, compatible with chronic small vessel disease.  CT CERVICAL SPINE FINDINGS  Negative for acute fracture or subluxation. No prevertebral edema. No gross cervical canal hematoma. There is diffuse degenerative disc narrowing. Associated spurring is most significant at C5-6, where there it is notable osseous bilateral foraminal stenosis. 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  IMPRESSION: 1. No acute intracranial or cervical spine injury. 2. Stable ventricular volume.   Electronically Signed   By: Jorje Guild M.D.   On: 02/13/2014 04:38   Ct Cervical Spine Wo Contrast  02/13/2014   CLINICAL DATA:  Fall with head and neck pain  EXAM: CT HEAD WITHOUT CONTRAST  CT CERVICAL SPINE WITHOUT CONTRAST  TECHNIQUE: Multidetector CT imaging of the head and cervical spine was performed following the standard protocol without intravenous contrast. Multiplanar CT image reconstructions of the cervical  spine were also generated.  COMPARISON:  08/08/2013 head CT  FINDINGS: CT HEAD FINDINGS  Skull and Sinuses:No acute fracture or destructive process. The mastoids, middle ears, and imaged paranasal sinuses are clear.  There is a right parietal approach ventriculoperitoneal shunt catheter. The catheter is in unchanged position, with tip near the foramen of Monro.  Orbits: Bilateral cataract resection.   Brain: No evidence of acute abnormality, such as acute infarction, hemorrhage, or mass lesion/mass effect. When accounting for differences in scan angle, no convincing change in the ventricular volume. A fatty focus anti dependent within the right lateral ventricle is chronic, dating back to 2013 at least. This could represent fat introduced at the time of catheter placement. There is generalized brain atrophy, age advanced. Stable low-attenuation around the lateral ventricles, compatible with chronic small vessel disease.  CT CERVICAL SPINE FINDINGS  Negative for acute fracture or subluxation. No prevertebral edema. No gross cervical canal hematoma. There is diffuse degenerative disc narrowing. Associated spurring is most significant at C5-6, where there it is notable osseous bilateral foraminal stenosis. 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  IMPRESSION: 1. No acute intracranial or cervical spine injury. 2. Stable ventricular volume.   Electronically Signed   By: Jorje Guild M.D.   On: 02/13/2014 04:38   Ct Abdomen W Contrast  02/14/2014   CLINICAL DATA:  Elevated liver function test. Prominent common bile duct on ultrasound.  EXAM: CT ABDOMEN WITH CONTRAST  TECHNIQUE: Multidetector CT imaging of the abdomen was performed using the standard protocol following bolus administration of intravenous contrast.  CONTRAST:  153mL OMNIPAQUE IOHEXOL 300 MG/ML  SOLN  COMPARISON:  02/13/2013 right upper quadrant ultrasound. Abdominal ultrasound 04/08/2004.  FINDINGS: Lower Chest: Bibasilar scarring and dependent atelectasis. Mild cardiomegaly, without pericardial or pleural effusion. Trace left pleural thickening. Small hiatal hernia.  Abdomen:  Mild hepatomegaly, 18.1 cm craniocaudal.  Normal spleen. The gastric body is underdistended. Descending duodenal diverticulum.  Atrophic pancreas. Cholecystectomy. Pneumobilia. Intrahepatic ducts are otherwise normal for prior cholecystectomy state. The common duct measures  1.0 cm, upper normal. There is equivocal interstitial thickening along the course of the common duct and adjacent descending duodenum. Example image 29/series 2.  No obstructive stone or mass.  No pancreatic ductal dilatation.  Normal adrenal glands and kidneys.  Incompletely imaged right-sided VP shunt catheter.  No retroperitoneal or retrocrural adenopathy. Scattered colonic diverticula. Normal abdominal small bowel without ascites.  High ventral abdominal wall hernia contains perihepatic fat and the superior aspect of the VP shunt catheter on image 30.  Bones/Musculoskeletal:  Grade 1 anterolisthesis of L4 on L5.  IMPRESSION: 1. Prior cholecystectomy with pneumobilia. Common duct upper normal without evidence of obstructive stone or mass. 2. Equivocal interstitial thickening in the region of the porta hepatis and along the course of the common duct. Presuming the patient's elevated liver function tests are incidental and isolated, this is likely within normal variation and may be partially artifactual secondary to patient body habitus. If however, the clinical symptoms suggest alternate explanation such as cholangitis or duodenitis, these would be alternate concerns. 3. Fat containing high ventral abdominal wall hernia.   Electronically Signed   By: Abigail Miyamoto M.D.   On: 02/14/2014 16:10   Mr Brain Wo Contrast  02/14/2014   CLINICAL DATA:  Altered mental status  EXAM: MRI HEAD WITHOUT CONTRAST  TECHNIQUE: Multiplanar, multiecho pulse sequences of the brain and surrounding structures were obtained without intravenous contrast.  COMPARISON:  06/03/2010  FINDINGS: The patient was unable  to remain still during imaging. Sagittal T1 imaging was not performed.  Calvarium: No marrow signal abnormality.  Orbits: Bilateral cataract resection.  Sinuses: Clear. Mastoid and middle ears are clear.  Brain: There is a ventriculoperitoneal shunt catheter via right parietal approach. The lateral ventricular volume appears  slightly greater than on head CT 08/08/2013. While the frontal horn morphology is similar, there appears is more fullness in the body/atria of the lateral ventricles and in the occipital horns. The temporal horns remain stable in size. The third and lateral ventricles are stable in size. There is periventricular T2 and FLAIR hyperintensity which is stable from preoperative MRI 06/03/2010, consistent with chronic small vessel disease. Generalized brain atrophy. No infarct, hemorrhage, mass lesion, or shift. No evidence of major vessel occlusion.  These results were called by telephone at the time of interpretation on 02/14/2014 at 4:41 am to [place] , who verbally acknowledged these results.  IMPRESSION: 1. Subtly increased lateral ventricular volume compared to 08/08/2013. Consider shunt workup. 2. Negative for infarct.   Electronically Signed   By: Jorje Guild M.D.   On: 02/14/2014 04:43   Mr Cervical Spine Wo Contrast  02/10/2014   CLINICAL DATA:  Neck pain which radiates between the shoulder blades with numbness and weakness in the left arm.  EXAM: MRI CERVICAL SPINE WITHOUT CONTRAST  TECHNIQUE: Multiplanar, multisequence MR imaging of the cervical spine was performed. No intravenous contrast was administered.  COMPARISON:  Cervical radiographs dated 12/27/2013  FINDINGS: The visualized intracranial contents, paraspinal soft tissues, and cervical spinal cord appear normal.  C1-2:  Normal.  C2-3: Auto fusion of the right facet joint. Normal disc. Widely patent neural foramina.  C3-4:  Normal.  C4-5: Tiny broad-based disc bulge with accompanying osteophytes without neural impingement.  C5-6: Disc space narrowing with prominent broad-based disc osteophyte complex extending into both lateral recesses and both neural foramina with severe left foraminal stenosis. The findings could affect either or both C6 nerves but more likely the left C6 nerve because of the foraminal stenosis. There is narrowing of the AP  dimension of the spinal canal but the spinal cord is not compressed.  C6-7: Disc space narrowing with broad-based disc osteophyte complex extending into both lateral recesses and neural foramina with minimal narrowing of the neural foramina.  C7-T1:  Normal.  T1-2 and T2-3:  Normal.  IMPRESSION: 1. Broad-based disc osteophyte complex at C5-6 asymmetric to the left with severe left foraminal stenosis. The findings could affect either or both C6 nerves. 2. Smaller disc osteophyte complexes at C4-5 and C5-6 without focal neural impingement. 3. Auto fusion of the left facet joint at C2-3.   Electronically Signed   By: Rozetta Nunnery M.D.   On: 02/10/2014 16:14   Ct Abdomen Pelvis W Contrast  02/22/2014   CLINICAL DATA:  Fall  EXAM: CT ABDOMEN AND PELVIS WITH CONTRAST  TECHNIQUE: Multidetector CT imaging of the abdomen and pelvis was performed using the standard protocol following bolus administration of intravenous contrast.  CONTRAST:  74mL OMNIPAQUE IOHEXOL 300 MG/ML  SOLN  COMPARISON:  02/14/2014  FINDINGS: Mild bibasilar atelectasis. Postcholecystectomy Diffuse hepatic steatosis. Postcholecystectomy Borderline splenomegaly with a maximal length of 14.2 cm. Kidneys, pancreas, and adrenal glands are within normal limits. Ventral abdominal hernia containing adipose tissue is stable VP shunt catheter traverses into the peritoneal space. It is coiled in the pelvis. Normal appendix. Bladder is distended. Uterus and adnexa are unremarkable. No vertebral compression deformity. Degenerative change with spinal stenosis at L4-5.  IMPRESSION: No acute abdominal injury.  Borderline splenomegaly.  Bibasilar atelectasis.  Stable chronic changes.   Electronically Signed   By: Maryclare Bean M.D.   On: 02/22/2014 15:45   Mr 3d Recon At Scanner  02/25/2014   CLINICAL DATA:  Nausea and vomiting. Prior cholecystectomy in 2005. Prior pancreatitis.  EXAM: MRI ABDOMEN WITHOUT AND WITH CONTRAST (INCLUDING MRCP)  TECHNIQUE: Multiplanar  multisequence MR imaging of the abdomen was performed both before and after the administration of intravenous contrast. Heavily T2-weighted images of the biliary and pancreatic ducts were obtained, and three-dimensional MRCP images were rendered by post processing.  CONTRAST:  54mL MULTIHANCE GADOBENATE DIMEGLUMINE 529 MG/ML IV SOLN  COMPARISON:  02/22/2014  FINDINGS: Trace left pleural effusion. Common bile duct dilatation to 11 mm with a 6 mm filling defect in the distal CBD and potentially and adjacent flat 0.5 x 0.2 cm filling defect also in the distal CBD.  Accessory left hepatic duct noted primarily serving the caudate lobe.  3 cm periampullary duodenal diverticulum. 1.6 cm cyst, right kidney lower pole. Spleen unremarkable.  Clustered borderline sized porta hepatis lymph nodes. 6.5 x 3.4 cm supraumbilical ventral hernia contains omental adipose tissue. Trace free fluid along the gallbladder fossa.  No specific pancreatic abnormality identified.  IMPRESSION: 1. 6 mm filling defect in the distal common bile duct associated with mild CBD dilatation. Appearance favors distal CBD stone. I do not see definite enhancement to suggest that this is a mass. There is an adjacent duodenal diverticulum. 2. The caudate lobe bile duct branch attach is to the common hepatic duct below the level of the confluence of the right and left hepatic ducts. 3. Ventral hernia contains omental adipose tissue. 4. Trace bilateral pleural effusions with bibasilar mild atelectasis. Trace edema tracks along the porta hepatis, etiology uncertain.   Electronically Signed   By: Sherryl Barters M.D.   On: 02/25/2014 16:18   Dg Chest Port 1 View  02/22/2014   CLINICAL DATA:  Fall.  EXAM: PORTABLE CHEST - 1 VIEW  COMPARISON:  07/03/2010.  FINDINGS: Ventriculoperitoneal shunt tubing noted over the right chest. Mediastinum and hilar structures normal. Cardiomegaly with normal pulmonary vascularity. Low lung volumes. Mild chronic interstitial  lung disease. No acute pulmonary infiltrate. No pleural effusion or pneumothorax. No acute osseous abnormality .  IMPRESSION: 1. Mild cardiomegaly.  No CHF. 2. Chronic interstitial lung disease. No acute pulmonary infiltrate. 3. Ventriculoperitoneal shunt noted.   Electronically Signed   By: Marcello Moores  Register   On: 02/22/2014 13:31   Dg Ercp Biliary & Pancreatic Ducts  02/28/2014   CLINICAL DATA:  Retained common bile duct stones  EXAM: ERCP  TECHNIQUE: Multiple spot images obtained with the fluoroscopic device and submitted for interpretation post-procedure.  COMPARISON:  02/25/2014  FINDINGS: Multiple spot films were not obtained during an ERCP. 1 min and 37 seconds of fluoroscopy was utilized. Injection was performed and reveals opacification of the common bile duct. By history a stone was removed without difficulty. Followup imaging shows no definitive retained stone.  : These images were submitted for radiologic interpretation only. Please see the procedural report for the amount of contrast and the fluoroscopy time utilized.   Electronically Signed   By: Inez Catalina M.D.   On: 02/28/2014 10:56   Mr Abd W/wo Cm/mrcp  02/25/2014   CLINICAL DATA:  Nausea and vomiting. Prior cholecystectomy in 2005. Prior pancreatitis.  EXAM: MRI ABDOMEN WITHOUT AND WITH CONTRAST (INCLUDING MRCP)  TECHNIQUE: Multiplanar multisequence MR imaging of  the abdomen was performed both before and after the administration of intravenous contrast. Heavily T2-weighted images of the biliary and pancreatic ducts were obtained, and three-dimensional MRCP images were rendered by post processing.  CONTRAST:  19mL MULTIHANCE GADOBENATE DIMEGLUMINE 529 MG/ML IV SOLN  COMPARISON:  02/22/2014  FINDINGS: Trace left pleural effusion. Common bile duct dilatation to 11 mm with a 6 mm filling defect in the distal CBD and potentially and adjacent flat 0.5 x 0.2 cm filling defect also in the distal CBD.  Accessory left hepatic duct noted primarily  serving the caudate lobe.  3 cm periampullary duodenal diverticulum. 1.6 cm cyst, right kidney lower pole. Spleen unremarkable.  Clustered borderline sized porta hepatis lymph nodes. 6.5 x 3.4 cm supraumbilical ventral hernia contains omental adipose tissue. Trace free fluid along the gallbladder fossa.  No specific pancreatic abnormality identified.  IMPRESSION: 1. 6 mm filling defect in the distal common bile duct associated with mild CBD dilatation. Appearance favors distal CBD stone. I do not see definite enhancement to suggest that this is a mass. There is an adjacent duodenal diverticulum. 2. The caudate lobe bile duct branch attach is to the common hepatic duct below the level of the confluence of the right and left hepatic ducts. 3. Ventral hernia contains omental adipose tissue. 4. Trace bilateral pleural effusions with bibasilar mild atelectasis. Trace edema tracks along the porta hepatis, etiology uncertain.   Electronically Signed   By: Sherryl Barters M.D.   On: 02/25/2014 16:18   US Abdomen Limited Ruq  02/13/2014   CLINICAL DATA:  Elevated liver function tests  EXAM: US ABDOMEN LIMITED - RIGHT UPPER QUADRANT  COMPARISON:  None.  FINDINGS: Gallbladder:  The gallbladder has previously been resected.  Common bile duct:  Diameter: The common bile duct is dilated measuring 12.0 mm in diameter possibly normal post cholecystectomy. Correlation with liver function tests recommended.  Liver:  There is central prominence of the intrahepatic ducts which may also be normal. The liver does appear somewhat echogenic suggesting fatty infiltration. No focal hepatic abnormality is seen.  IMPRESSION: 1. Prominent common bile duct and central intrahepatic ducts may be normal post cholecystectomy. Correlate with LFTs and consider CT or MRI of the abdomen if further assessment is warranted. 2. Somewhat echogenic liver parenchyma may indicate fatty infiltration. No focal abnormality is noted.   Electronically Signed    By: Ivar Drape M.D.   On: 02/13/2014 09:27    Micro Results      Recent Results (from the past 240 hour(s))  CULTURE, BLOOD (ROUTINE X 2)     Status: None   Collection Time    02/22/14  1:00 PM      Result Value Ref Range Status   Specimen Description BLOOD RIGHT ANTECUBITAL   Final   Special Requests BOTTLES DRAWN AEROBIC AND ANAEROBIC 10CC   Final   Culture  Setup Time     Final   Value: 02/22/2014 17:30     Performed at [facility]   Culture     Final   Value: NO GROWTH 5 DAYS     Performed at [facility]   Report Status 02/28/2014 FINAL   Final  CULTURE, BLOOD (ROUTINE X 2)     Status: None   Collection Time    02/22/14  1:20 PM      Result Value Ref Range Status   Specimen Description BLOOD RIGHT HAND   Final   Special Requests BOTTLES DRAWN AEROBIC AND ANAEROBIC  10CC   Final   Culture  Setup Time     Final   Value: 02/22/2014 17:29     Performed at [facility]   Culture     Final   Value: NO GROWTH 5 DAYS     Performed at [facility]   Report Status 02/28/2014 FINAL   Final  URINE CULTURE     Status: None   Collection Time    02/22/14  2:54 PM      Result Value Ref Range Status   Specimen Description URINE, CLEAN CATCH   Final   Special Requests NONE   Final   Culture  Setup Time     Final   Value: 02/22/2014 16:01     Performed at [facility]     Final   Value: 75,000 COLONIES/ML     Performed at [facility]   Culture     Final   Value: Multiple bacterial morphotypes present, none predominant. Suggest appropriate recollection if clinically indicated.     Performed at [facility]   Report Status 02/23/2014 FINAL   Final       Today   Subjective:   [name] today has no headache,no chest abdominal pain,no new weakness tingling or numbness, feels much better wants to go home today.    Objective:   Blood pressure 158/91, pulse 84, temperature 98 F (36.7 C),  temperature source Oral, resp. rate 18, height 5\' 5"  (1.651 m), weight 110.1 kg (242 lb 11.6 oz), SpO2 93.00%.   Intake/Output Summary (Last 24 hours) at 03/01/14 1016 Last data filed at 02/28/14 1645  Gross per 24 hour  Intake    990 ml  Output      0 ml  Net    990 ml    Exam Awake Alert, Oriented x 3, No new F.N deficits, Normal affect [place].AT,PERRAL Supple Neck,No JVD, No cervical lymphadenopathy appriciated.  Symmetrical Chest wall movement, Good air movement bilaterally, CTAB RRR,No Gallops,Rubs or new Murmurs, No Parasternal Heave +ve B.Sounds, Abd Soft, Non tender, No organomegaly appriciated, No rebound -guarding or rigidity. No Cyanosis, Clubbing or edema, No new Rash or bruise  Data Review   CBC w Diff: Lab Results  Component Value Date   WBC 4.5 03/01/2014   HGB 12.8 03/01/2014   HCT 38.1 03/01/2014   PLT 236 03/01/2014   LYMPHOPCT 33 02/25/2014   MONOPCT 9 02/25/2014   EOSPCT 4 02/25/2014   BASOPCT 1 02/25/2014    CMP: Lab Results  Component Value Date   NA 144 03/01/2014   K 3.9 03/01/2014   CL 106 03/01/2014   CO2 26 03/01/2014   BUN 6 03/01/2014   CREATININE 0.81 03/01/2014   PROT 6.4 03/01/2014   ALBUMIN 2.9* 03/01/2014   BILITOT 1.1 03/01/2014   ALKPHOS 218* 03/01/2014   AST 96* 03/01/2014   ALT 86* 03/01/2014  .   Total Time in preparing paper work, data evaluation and todays exam - 35 minutes  Thurnell Lose M.D on 03/01/2014 at 10:16 AM  [facility] Office  [phone]   **Disclaimer: This note may have been dictated with voice recognition software. Similar sounding words can inadvertently be transcribed and this note may contain transcription errors which may not have been corrected upon publication of note.**

## 2014-03-01 NOTE — Progress Notes (Signed)
          Daily Rounding Note  03/01/2014, 9:16 AM  LOS: 7 days   SUBJECTIVE:       No nausea, no pain.  Ready to go home.  OBJECTIVE:         Vital signs in last 24 hours:    Temp:  [98 F (36.7 C)-98.9 F (37.2 C)] 98 F (36.7 C) (07/31 0449) Pulse Rate:  [71-84] 84 (07/31 0449) Resp:  [18-23] 18 (07/31 0449) BP: (150-166)/(72-91) 158/91 mmHg (07/31 0449) SpO2:  [92 %-96 %] 93 % (07/31 0449) Weight:  [110.1 kg (242 lb 11.6 oz)] 110.1 kg (242 lb 11.6 oz) (07/31 0700) Last BM Date: 02/26/14 General: looks well, comfortable   Heart: RRR Chest: clear bil Abdomen: soft, NT, ND.  Active BS  Extremities: no CCE Neuro/Psych:  Pleasant, oriented x 3.  No deficits.   Lab Results:  Recent Labs  02/27/14 0342 02/28/14 0552 03/01/14 0550  WBC 5.0 4.9 4.5  HGB 12.9 12.9 12.8  HCT 39.1 39.2 38.1  PLT 246 254 236   BMET   Recent Labs  02/27/14 0342 02/28/14 0552 03/01/14 0550  PROT 6.5 6.4 6.4  ALBUMIN 2.9* 2.8* 2.9*  AST 116* 110* 96*  ALT 115* 101* 86*  ALKPHOS 214* 241* 218*  BILITOT 1.2 1.1 1.1   PT/INR  Studies/Results: Dg Ercp Biliary & Pancreatic Ducts  02/28/2014   CLINICAL DATA:  Retained common bile duct stones  EXAM: ERCP  TECHNIQUE: Multiple spot images obtained with the fluoroscopic device and submitted for interpretation post-procedure.  COMPARISON:  02/25/2014  FINDINGS: Multiple spot films were not obtained during an ERCP. 1 min and 37 seconds of fluoroscopy was utilized. Injection was performed and reveals opacification of the common bile duct. By history a stone was removed without difficulty. Followup imaging shows no definitive retained stone.  : These images were submitted for radiologic interpretation only. Please see the procedural report for the amount of contrast and the fluoroscopy time utilized.   Electronically Signed   By: Inez Catalina M.D.   On: 02/28/2014 10:56    ASSESMENT:   *  Abnormal LFTs. Fatty liver. Borderline splenomegaly. Prior cholecystectomy 2005 and pneumobilia. Normal intrahepatic ducts, normal pancreatic duct. Hepatitis ABC serologies negative. Likely had previous ERCP and associated pancreatitis but no records found.  S/p ERCP with shincterotomy and removal of CBD stone.  No ERCP pancreatitis.  * Vomiting, x 2 weeks. Nauseated then vomits quickly and feels better. This resolved. suspecct all was sx of CBD stone * S/p VP shunt.     PLAN   *  Home today. Has ROV 05/13/13 with GI. Can call for sooner appt if having problems or consult PMD Dr Ronnald Ramp.  *  Regular diet.     [name]  03/01/2014, 9:16 AM Pager: [phone]  Attending:  Agree w/ Ms. Sandi Carne note.  I think we can just call her from the office to see if she is ok and PCP can recheck LFT's - not sure she needs GI f/u and October is so far down road may not be meaningful at that time.  Will let my office know to call her.  Gatha Mayer, MD, [facility] [phone] (pager) 03/01/2014 11:13 AM

## 2014-03-01 NOTE — Anesthesia Postprocedure Evaluation (Signed)
Anesthesia Post Note  Patient: [name]  Procedure(s) Performed: Procedure(s) (LRB): ENDOSCOPIC RETROGRADE CHOLANGIOPANCREATOGRAPHY (ERCP) (N/A) ESOPHAGOGASTRODUODENOSCOPY (EGD) (N/A)  Anesthesia type: General  Patient location: PACU  Post pain: Pain level controlled and Adequate analgesia  Post assessment: Post-op Vital signs reviewed, Patient's Cardiovascular Status Stable, Respiratory Function Stable, Patent Airway and Pain level controlled  Last Vitals:  Filed Vitals:   03/01/14 0449  BP: 158/91  Pulse: 84  Temp: 36.7 C  Resp: 18    Post vital signs: Reviewed and stable  Level of consciousness: awake, alert  and oriented  Complications: No apparent anesthesia complications

## 2014-03-04 ENCOUNTER — Encounter (HOSPITAL_COMMUNITY): Payer: Self-pay | Admitting: Internal Medicine

## 2014-03-05 ENCOUNTER — Emergency Department (HOSPITAL_COMMUNITY): Payer: PRIVATE HEALTH INSURANCE

## 2014-03-05 ENCOUNTER — Encounter (HOSPITAL_COMMUNITY): Payer: Self-pay | Admitting: Emergency Medicine

## 2014-03-05 ENCOUNTER — Emergency Department (HOSPITAL_COMMUNITY)
Admission: EM | Admit: 2014-03-05 | Discharge: 2014-03-05 | Disposition: A | Discharge status: Home / Self Care | Payer: PRIVATE HEALTH INSURANCE | Attending: Emergency Medicine | Admitting: Emergency Medicine

## 2014-03-05 DIAGNOSIS — F3289 Other specified depressive episodes: Secondary | ICD-10-CM | POA: Insufficient documentation

## 2014-03-05 DIAGNOSIS — R5381 Other malaise: Secondary | ICD-10-CM | POA: Diagnosis not present

## 2014-03-05 DIAGNOSIS — Y929 Unspecified place or not applicable: Secondary | ICD-10-CM | POA: Diagnosis not present

## 2014-03-05 DIAGNOSIS — E119 Type 2 diabetes mellitus without complications: Secondary | ICD-10-CM | POA: Insufficient documentation

## 2014-03-05 DIAGNOSIS — S99919A Unspecified injury of unspecified ankle, initial encounter: Secondary | ICD-10-CM | POA: Diagnosis present

## 2014-03-05 DIAGNOSIS — R32 Unspecified urinary incontinence: Secondary | ICD-10-CM | POA: Diagnosis not present

## 2014-03-05 DIAGNOSIS — Z79899 Other long term (current) drug therapy: Secondary | ICD-10-CM | POA: Insufficient documentation

## 2014-03-05 DIAGNOSIS — I10 Essential (primary) hypertension: Secondary | ICD-10-CM | POA: Diagnosis not present

## 2014-03-05 DIAGNOSIS — R209 Unspecified disturbances of skin sensation: Secondary | ICD-10-CM | POA: Diagnosis not present

## 2014-03-05 DIAGNOSIS — F329 Major depressive disorder, single episode, unspecified: Secondary | ICD-10-CM | POA: Diagnosis not present

## 2014-03-05 DIAGNOSIS — Y939 Activity, unspecified: Secondary | ICD-10-CM | POA: Insufficient documentation

## 2014-03-05 DIAGNOSIS — S8990XA Unspecified injury of unspecified lower leg, initial encounter: Secondary | ICD-10-CM | POA: Insufficient documentation

## 2014-03-05 DIAGNOSIS — Z880 Allergy status to penicillin: Secondary | ICD-10-CM | POA: Insufficient documentation

## 2014-03-05 DIAGNOSIS — S99929A Unspecified injury of unspecified foot, initial encounter: Principal | ICD-10-CM

## 2014-03-05 DIAGNOSIS — F411 Generalized anxiety disorder: Secondary | ICD-10-CM | POA: Diagnosis not present

## 2014-03-05 DIAGNOSIS — M48061 Spinal stenosis, lumbar region without neurogenic claudication: Secondary | ICD-10-CM

## 2014-03-05 DIAGNOSIS — R296 Repeated falls: Secondary | ICD-10-CM | POA: Insufficient documentation

## 2014-03-05 DIAGNOSIS — M25562 Pain in left knee: Secondary | ICD-10-CM

## 2014-03-05 DIAGNOSIS — R5383 Other fatigue: Secondary | ICD-10-CM | POA: Insufficient documentation

## 2014-03-05 MED ORDER — HYDROCODONE-ACETAMINOPHEN 5-325 MG PO TABS
1.0000 | ORAL_TABLET | ORAL | Status: DC | PRN
Start: 2014-03-05 — End: 2014-04-02

## 2014-03-05 MED ORDER — ONDANSETRON 4 MG PO TBDP
4.0000 mg | ORAL_TABLET | Freq: Once | ORAL | Status: AC
  Administered 2014-03-05: 4 mg via ORAL
  Filled 2014-03-05: qty 1

## 2014-03-05 NOTE — ED Notes (Addendum)
PT reports the Lt knee started to hurt on her D/C date 03-01-14. :Pt ambulatory to room from front lolbby.PT reports she has a hard time getting out of the chair because of the Knee pain.Pt also reports numbness to LT lateral side of knee.

## 2014-03-05 NOTE — ED Provider Notes (Signed)
CSN: [id number]     Arrival date & time 03/05/14  1744 History  This chart was scribed for non-physician practitioner working with Babette Relic, MD, by Jeanell Sparrow, ED Scribe. This patient was seen in room TR07C/TR07C and the patient's care was started at 6:00 PM.   Chief Complaint  Patient presents with  . Knee Pain   HPI Comments: [name] is a 68 y.o. female who presents to the Emergency Department complaining of moderate intermittent left knee pain that started a few days ago. She reports that she fell and landed on her left knee. She states that she is able to ambulate. She reports that she took hydrocodone and acetaminophen this morning without relief. She states that she has numbness in both of knees that began after discharge on July 31st. The patient reports falling due to weakness of the left knee. She also states that she has urinary incontinence but no fecal incontinence. The patient reports she been treated with Vesicare her urinary incontinence in the past. She denies any hx of cancer. She also denies any fever, chills, or back pain.    The history is provided by the patient. No language interpreter was used.      Past Medical History  Diagnosis Date  . Depression   . Anxiety   . Hypertension   . Diabetes mellitus without complication   . MVP (mitral valve prolapse)   . Hydrocephalus    Past Surgical History  Procedure Laterality Date  . Cholecystectomy    . Brain surgery    . Ercp N/A 02/28/2014    Procedure: ENDOSCOPIC RETROGRADE CHOLANGIOPANCREATOGRAPHY (ERCP);  Surgeon: Gatha Mayer, MD;  Location: [facility] ENDOSCOPY;  Service: Endoscopy;  Laterality: N/A;  talked to tiffany from or /ebp  . Esophagogastroduodenoscopy N/A 02/28/2014    Procedure: ESOPHAGOGASTRODUODENOSCOPY (EGD);  Surgeon: Gatha Mayer, MD;  Location: [facility] ENDOSCOPY;  Service: Endoscopy;  Laterality: N/A;   Family History  Problem Relation Age of Onset  . Hypertension Mother   . Heart disease  Mother   . Stroke Mother   . Alcohol abuse Father   . Diabetes Father   . Cancer Neg Hx    History  Substance Use Topics  . Smoking status: Never Smoker   . Smokeless tobacco: Never Used  . Alcohol Use: No   OB History   Grav Para Term Preterm Abortions TAB SAB Ect Mult Living                 Review of Systems  Constitutional: Negative for fever and chills.  Musculoskeletal: Positive for arthralgias and myalgias.  Neurological: Positive for weakness and numbness.  All other systems reviewed and are negative.     Allergies  Metformin and related; Ace inhibitors; Amitriptyline hcl; Erythromycin; and Penicillins  Home Medications   Prior to Admission medications   Medication Sig Start Date End Date Taking? Authorizing Provider  gabapentin (NEURONTIN) 300 MG capsule Take 300 mg by mouth 2 (two) times daily as needed (pain).    Historical Provider, MD  HYDROcodone-acetaminophen (NORCO/VICODIN) 5-325 MG per tablet Take 1 tablet by mouth every 8 (eight) hours as needed. 02/20/14   Historical Provider, MD  ibuprofen (ADVIL,MOTRIN) 200 MG tablet Take 400 mg by mouth every 6 (six) hours as needed for headache.    Historical Provider, MD  LORazepam (ATIVAN) 1 MG tablet Take 1 mg by mouth every 6 (six) hours as needed for anxiety. Take up to 4 times qd  Historical Provider, MD  losartan (COZAAR) 100 MG tablet Take 100 mg by mouth daily.    Historical Provider, MD  metFORMIN (GLUCOPHAGE) 500 MG tablet Take 1 tablet (500 mg total) by mouth daily with breakfast. 03/03/14   Thurnell Lose, MD  OLANZapine-FLUoxetine (SYMBYAX) 6-25 MG per capsule Take 1 capsule by mouth at bedtime.    Historical Provider, MD  ranitidine (ZANTAC) 150 MG tablet Take 150 mg by mouth 3 (three) times daily as needed for heartburn.    Historical Provider, MD  solifenacin (VESICARE) 10 MG tablet Take 10 mg by mouth daily.    Historical Provider, MD  Tetrahydrozoline HCl (VISINE OP) Place 2 drops into both eyes 4  (four) times daily as needed (dry eyes).    Historical Provider, MD  tiZANidine (ZANAFLEX) 4 MG tablet Take 4 mg by mouth 2 (two) times daily as needed for muscle spasms.    Historical Provider, MD  traMADol (ULTRAM) 50 MG tablet Take 1 tablet (50 mg total) by mouth every 6 (six) hours as needed for moderate pain. 02/15/14   Geradine Girt, DO  Vitamin D, Ergocalciferol, (DRISDOL) 50000 UNITS CAPS capsule Take 50,000 Units by mouth once a week. On Monday    Historical Provider, MD  Vortioxetine HBr (BRINTELLIX) 10 MG TABS Take 10 mg by mouth every morning.     Historical Provider, MD   There were no vitals taken for this visit. Physical Exam  Nursing note and vitals reviewed. Constitutional: She is oriented to person, place, and time. She appears well-developed and well-nourished.  Non-toxic appearance. She does not have a sickly appearance. She does not appear ill. No distress.  HENT:  Head: Normocephalic and atraumatic.  Neck: Neck supple.  Pulmonary/Chest: Effort normal. No respiratory distress.  Musculoskeletal: Normal range of motion.  Neurological: She is alert and oriented to person, place, and time. A sensory deficit is present. She exhibits normal muscle tone.  Decreased sensation to the lateral bilateral lower extremities.  Good and equal strength to lower Turvey's bilaterally. Able to ambulate in ED.  Skin: Skin is warm and dry. She is not diaphoretic.  Psychiatric: She has a normal mood and affect. Her behavior is normal.    ED Course  Procedures (including critical care time)  COORDINATION OF CARE: 6:04 PM- Pt advised of plan for treatment which includes radiology and pt agrees.  Labs Review Labs Reviewed - No data to display  Imaging Review Dg Lumbar Spine Complete  03/06/2014   CLINICAL DATA:  Pain post trauma  EXAM: LUMBAR SPINE - COMPLETE 4+ VIEW  COMPARISON:  Lumbar MRI August 23, 2013 and CT abdomen and pelvis with bony reformats February 22, 2014  FINDINGS: Frontal,  lateral, spot lumbosacral lateral, and bilateral oblique views were obtained. There is a shunt present with the shunt catheter tip in the lower left pelvis. There are 5 non-rib-bearing lumbar type vertebral bodies. There is slight anterior wedging of the T12 vertebral body which is stable compared to the recent CT examination but was not appreciable on the prior MR examination. There is no new fracture. There is 7 mm of anterolisthesis of L4 on L5 which is stable compared to prior studies. There is no new spondylolisthesis. There is disc space narrowing at T12-L1 and L1-2. There is milder disc space narrowing at L2-3, essentially stable. There is facet osteoarthritic change at L4-5 and L5-S1 bilaterally.  IMPRESSION: Mild anterior wedging of the T12 vertebral body, stable compared to recent CT but not present  on prior MR. No new fracture. Stable grade I/ IV anterolisthesis of L4 on L5. No new spondylolisthesis. Areas of osteoarthritic change, stable. 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   Electronically Signed   By: Lowella Grip M.D.   On: 03/06/2014 10:09   Dg Hip Bilateral W/pelvis  03/06/2014   CLINICAL DATA:  Fall, hip pain.  EXAM: BILATERAL HIP WITH PELVIS - 4+ VIEW  COMPARISON:  CT of the abdomen and pelvis 02/22/2014  FINDINGS: SI joints and hip joints are symmetric and unremarkable. No acute bony abnormality. Specifically, no fracture, subluxation, or dislocation. Soft tissues are intact.  The distal aspect of a ventriculoperitoneal shunt is again seen within the pelvis.  IMPRESSION: No acute bony abnormality.   Electronically Signed   By: Rolm Baptise M.D.   On: 03/06/2014 10:06   Mr Thoracic Spine Wo Contrast  03/05/2014   CLINICAL DATA:  Bilateral leg numbness.  Urinary incontinence.  EXAM: MRI THORACIC AND LUMBAR SPINE WITHOUT CONTRAST  TECHNIQUE: Multiplanar and multiecho pulse sequences of the thoracic and lumbar spine were obtained without intravenous  contrast.  COMPARISON:  Lumbar spine MRI 08/23/2013.  FINDINGS: MR THORACIC SPINE FINDINGS  Vertebral alignment is within normal limits. Vertebral body heights are preserved without evidence of compression fracture. Scattered degenerative marrow changes are noted throughout the thoracic spine, most prominently at T11-12. A hemangioma is noted in the L1 vertebral body. Thoracic spinal cord is normal in caliber and signal. Small left paracentral disc protrusions are present at T6-7, T7-8, and T8-9 without stenosis. At T9-10, mild disc bulging and mild-to-moderate left facet hypertrophy result in mild left neural foraminal stenosis. Mild bilateral neural foraminal stenosis is present at T10-11 due to facet hypertrophy. Paraspinal soft tissues are unremarkable.  MR LUMBAR SPINE FINDINGS  Grade 1 anterolisthesis of L4 on L5 is unchanged. There is no lumbar compression fracture. Prominent dorsal epidural fat is again seen throughout the lumbar spine. There is also mild, diffuse narrowing of the lumbar spinal canal due to congenitally short pedicles. Mild multilevel disc desiccation is again seen, most prominently at L3-4 and L4-5. L1 vertebral body hemangioma is noted. Conus medullaris is normal in signal and terminates at L1. 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 T2 hyperintense lesion in the lower pole the right kidney is unchanged and most likely represents a cyst.  L1-2:  Minimal disc bulge without stenosis.  L2-3: Minimal disc bulging, congenitally short pedicles, prominent dorsal epidural fat, and mild-to-moderate facet and ligamentum flavum hypertrophy without stenosis.  L3-4: Minimal disc bulging, congenitally short pedicles, prominent dorsal epidural fat, and moderate facet and ligamentum flavum hypertrophy result in mild narrowing of the thecal sac, unchanged. No neural foraminal stenosis.  L4-5: Listhesis with uncovering of the disc, congenitally short pedicles, and severe facet hypertrophy result in severe spinal  stenosis and minimal right neural foraminal narrowing, not significantly changed.  L5-S1: Moderate to severe facet arthrosis without significant spinal canal or neural foraminal stenosis, unchanged.  IMPRESSION: MR THORACIC SPINE IMPRESSION  1. Mild thoracic spondylosis without spinal stenosis or cord compression. 2. Mild neural foraminal narrowing on the left at T9-10 and bilaterally at T10-11 due to facet arthrosis.  MR LUMBAR SPINE IMPRESSION  Unchanged appearance of lumbar disc degeneration and facet arthrosis, most pronounced at L4-5 where there is severe spinal stenosis.   Electronically Signed   By: Logan Bores   On: 03/05/2014 21:09   Mr Lumbar Spine Wo Contrast  03/05/2014   CLINICAL  DATA:  Bilateral leg numbness.  Urinary incontinence.  EXAM: MRI THORACIC AND LUMBAR SPINE WITHOUT CONTRAST  TECHNIQUE: Multiplanar and multiecho pulse sequences of the thoracic and lumbar spine were obtained without intravenous contrast.  COMPARISON:  Lumbar spine MRI 08/23/2013.  FINDINGS: MR THORACIC SPINE FINDINGS  Vertebral alignment is within normal limits. Vertebral body heights are preserved without evidence of compression fracture. Scattered degenerative marrow changes are noted throughout the thoracic spine, most prominently at T11-12. A hemangioma is noted in the L1 vertebral body. Thoracic spinal cord is normal in caliber and signal. Small left paracentral disc protrusions are present at T6-7, T7-8, and T8-9 without stenosis. At T9-10, mild disc bulging and mild-to-moderate left facet hypertrophy result in mild left neural foraminal stenosis. Mild bilateral neural foraminal stenosis is present at T10-11 due to facet hypertrophy. Paraspinal soft tissues are unremarkable.  MR LUMBAR SPINE FINDINGS  Grade 1 anterolisthesis of L4 on L5 is unchanged. There is no lumbar compression fracture. Prominent dorsal epidural fat is again seen throughout the lumbar spine. There is also mild, diffuse narrowing of the lumbar  spinal canal due to congenitally short pedicles. Mild multilevel disc desiccation is again seen, most prominently at L3-4 and L4-5. L1 vertebral body hemangioma is noted. Conus medullaris is normal in signal and terminates at L1. 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 T2 hyperintense lesion in the lower pole the right kidney is unchanged and most likely represents a cyst.  L1-2:  Minimal disc bulge without stenosis.  L2-3: Minimal disc bulging, congenitally short pedicles, prominent dorsal epidural fat, and mild-to-moderate facet and ligamentum flavum hypertrophy without stenosis.  L3-4: Minimal disc bulging, congenitally short pedicles, prominent dorsal epidural fat, and moderate facet and ligamentum flavum hypertrophy result in mild narrowing of the thecal sac, unchanged. No neural foraminal stenosis.  L4-5: Listhesis with uncovering of the disc, congenitally short pedicles, and severe facet hypertrophy result in severe spinal stenosis and minimal right neural foraminal narrowing, not significantly changed.  L5-S1: Moderate to severe facet arthrosis without significant spinal canal or neural foraminal stenosis, unchanged.  IMPRESSION: MR THORACIC SPINE IMPRESSION  1. Mild thoracic spondylosis without spinal stenosis or cord compression. 2. Mild neural foraminal narrowing on the left at T9-10 and bilaterally at T10-11 due to facet arthrosis.  MR LUMBAR SPINE IMPRESSION  Unchanged appearance of lumbar disc degeneration and facet arthrosis, most pronounced at L4-5 where there is severe spinal stenosis.   Electronically Signed   By: Logan Bores   On: 03/05/2014 21:09   Dg Knee Complete 4 Views Left  03/05/2014   CLINICAL DATA:  Fall, knee numbness, predominately laterally.  EXAM: LEFT KNEE - COMPLETE 4+ VIEW  COMPARISON:  None.  FINDINGS: No acute fracture deformity or dislocation. Tricompartmental osteoarthrosis, severe within the patellofemoral, moderate to severe within the medial lateral compartments. No  destructive bony lesions. Loose body versus irregular fabella within the popliteal fossa. Soft tissue planes are nonsuspicious.  IMPRESSION: No acute fracture deformity nor dislocation.  Tricompartmental osteoarthrosis, severe within the patellofemoral compartment.   Electronically Signed   By: Elon Alas   On: 03/05/2014 18:46     EKG Interpretation None      MDM   Final diagnoses:  Left knee pain  Spinal stenosis of lumbar region   Patient presents with left knee pain and bilateral leg numbness, decreased sensation to light touch. EMR shows MR I perform January 2015 with degenerative changes in the L. spine and spinal stenosis at L4-5. Discussed patient history, examined  with Dr. Stevie Kern.  Plan to repeat MRI to rule out stenosis. Dr. Stevie Kern also evaluate patient during this counter. MRI shows stable changes, no central compression. Discussed imaging results, and treatment plan with the patient. Return precautions given. Reports understanding and no other concerns at this time.  Patient is stable for discharge at this time. After discharge the patient to down to tie her shoe and began vomiting, Zofran ordered. Patient was evaluated a short period after in ED without further emesis. She ambulated the provider just requesting or discharge.  I personally performed the services described in this documentation, which was scribed in my presence. The recorded information has been reviewed and is accurate.     Harvie Heck, PA-C 03/07/14 [phone]

## 2014-03-05 NOTE — Discharge Instructions (Signed)
Call for a follow up appointment with a Family or Primary Care Provider fr further evaluation of your back pain.  Call a Orthopedic for further evaluation for your knee pain. Return if Symptoms worsen.   Take medication as prescribed.  Ice your knee 3-4 times a day.

## 2014-03-05 NOTE — ED Provider Notes (Signed)
Medical screening examination/treatment/procedure(s) were conducted as a shared visit with non-physician practitioner(s) and myself.  I personally evaluated the patient during the encounter.   EKG Interpretation None      New onset of last few days of bilateral lateral thigh numbness as well as recurrent bladder incontinence like she has had in the past no back pain to both legs felt weak and gave way today; denies numbness to medial thighs or lower legs or feet. MR ordered.  Babette Relic, MD 03/06/14 [phone]

## 2014-03-05 NOTE — ED Notes (Signed)
Pt observed vomiting into trash can . Pt reported that she started to vomit after bending over to tie shoes.

## 2014-03-06 ENCOUNTER — Encounter (HOSPITAL_COMMUNITY): Payer: Self-pay | Admitting: Emergency Medicine

## 2014-03-06 ENCOUNTER — Emergency Department (HOSPITAL_COMMUNITY)
Admission: EM | Admit: 2014-03-06 | Discharge: 2014-03-06 | Disposition: A | Discharge status: Home / Self Care | Payer: PRIVATE HEALTH INSURANCE | Attending: Emergency Medicine | Admitting: Emergency Medicine

## 2014-03-06 ENCOUNTER — Emergency Department (HOSPITAL_COMMUNITY): Payer: PRIVATE HEALTH INSURANCE

## 2014-03-06 DIAGNOSIS — Z043 Encounter for examination and observation following other accident: Secondary | ICD-10-CM | POA: Diagnosis present

## 2014-03-06 DIAGNOSIS — F3289 Other specified depressive episodes: Secondary | ICD-10-CM | POA: Diagnosis not present

## 2014-03-06 DIAGNOSIS — Z880 Allergy status to penicillin: Secondary | ICD-10-CM | POA: Insufficient documentation

## 2014-03-06 DIAGNOSIS — M545 Low back pain, unspecified: Secondary | ICD-10-CM | POA: Diagnosis not present

## 2014-03-06 DIAGNOSIS — Z79899 Other long term (current) drug therapy: Secondary | ICD-10-CM | POA: Insufficient documentation

## 2014-03-06 DIAGNOSIS — M25559 Pain in unspecified hip: Secondary | ICD-10-CM | POA: Insufficient documentation

## 2014-03-06 DIAGNOSIS — Z791 Long term (current) use of non-steroidal anti-inflammatories (NSAID): Secondary | ICD-10-CM | POA: Diagnosis not present

## 2014-03-06 DIAGNOSIS — Z8679 Personal history of other diseases of the circulatory system: Secondary | ICD-10-CM | POA: Diagnosis not present

## 2014-03-06 DIAGNOSIS — Q039 Congenital hydrocephalus, unspecified: Secondary | ICD-10-CM | POA: Diagnosis not present

## 2014-03-06 DIAGNOSIS — I10 Essential (primary) hypertension: Secondary | ICD-10-CM | POA: Insufficient documentation

## 2014-03-06 DIAGNOSIS — F329 Major depressive disorder, single episode, unspecified: Secondary | ICD-10-CM | POA: Diagnosis not present

## 2014-03-06 DIAGNOSIS — F411 Generalized anxiety disorder: Secondary | ICD-10-CM | POA: Diagnosis not present

## 2014-03-06 DIAGNOSIS — E860 Dehydration: Secondary | ICD-10-CM | POA: Insufficient documentation

## 2014-03-06 DIAGNOSIS — W19XXXA Unspecified fall, initial encounter: Secondary | ICD-10-CM

## 2014-03-06 LAB — CBC WITH DIFFERENTIAL/PLATELET
Basophils Absolute: 0 10*3/uL (ref 0.0–0.1)
Basophils Relative: 0 % (ref 0–1)
Eosinophils Absolute: 0 10*3/uL (ref 0.0–0.7)
Eosinophils Relative: 1 % (ref 0–5)
HCT: 41.1 % (ref 36.0–46.0)
Hemoglobin: 13.6 g/dL (ref 12.0–15.0)
Lymphocytes Relative: 16 % (ref 12–46)
Lymphs Abs: 0.9 10*3/uL (ref 0.7–4.0)
MCH: 31.4 pg (ref 26.0–34.0)
MCHC: 33.1 g/dL (ref 30.0–36.0)
MCV: 94.9 fL (ref 78.0–100.0)
Monocytes Absolute: 0.7 10*3/uL (ref 0.1–1.0)
Monocytes Relative: 13 % — ABNORMAL HIGH (ref 3–12)
Neutro Abs: 3.8 10*3/uL (ref 1.7–7.7)
Neutrophils Relative %: 70 % (ref 43–77)
Platelets: 193 10*3/uL (ref 150–400)
RBC: 4.33 MIL/uL (ref 3.87–5.11)
RDW: 14.2 % (ref 11.5–15.5)
WBC: 5.4 10*3/uL (ref 4.0–10.5)

## 2014-03-06 LAB — BASIC METABOLIC PANEL
Anion gap: 14 (ref 5–15)
BUN: 11 mg/dL (ref 6–23)
CO2: 25 mEq/L (ref 19–32)
Calcium: 9.1 mg/dL (ref 8.4–10.5)
Chloride: 105 mEq/L (ref 96–112)
Creatinine, Ser: 1.01 mg/dL (ref 0.50–1.10)
GFR calc Af Amer: 65 mL/min — ABNORMAL LOW (ref >90)
GFR calc non Af Amer: 56 mL/min — ABNORMAL LOW (ref >90)
Glucose, Bld: 130 mg/dL — ABNORMAL HIGH (ref 70–99)
Potassium: 3.4 mEq/L — ABNORMAL LOW (ref 3.7–5.3)
Sodium: 144 mEq/L (ref 137–147)

## 2014-03-06 LAB — URINALYSIS, ROUTINE W REFLEX MICROSCOPIC
Bilirubin Urine: NEGATIVE
Glucose, UA: NEGATIVE mg/dL
Hgb urine dipstick: NEGATIVE
Ketones, ur: NEGATIVE mg/dL
Leukocytes, UA: NEGATIVE
Nitrite: NEGATIVE
Protein, ur: NEGATIVE mg/dL
Specific Gravity, Urine: 1.011 (ref 1.005–1.030)
Urobilinogen, UA: 1 mg/dL (ref 0.0–1.0)
pH: 5.5 (ref 5.0–8.0)

## 2014-03-06 LAB — CBG MONITORING, ED: Glucose-Capillary: 119 mg/dL — ABNORMAL HIGH (ref 70–99)

## 2014-03-06 MED ORDER — SODIUM CHLORIDE 0.9 % IV BOLUS (SEPSIS)
500.0000 mL | Freq: Once | INTRAVENOUS | Status: AC
  Administered 2014-03-06: 500 mL via INTRAVENOUS

## 2014-03-06 MED ORDER — SODIUM CHLORIDE 0.9 % IV SOLN
INTRAVENOUS | Status: DC
  Administered 2014-03-06 (×2): via INTRAVENOUS

## 2014-03-06 NOTE — Discharge Instructions (Signed)
Workup from the fall without significant findings. Return for any newer worse symptoms. Followup with your regular Dr.

## 2014-03-06 NOTE — ED Notes (Signed)
Pt transported to xray 

## 2014-03-06 NOTE — ED Notes (Signed)
Pt states she fell outside in yard last night around 10:40pm, states she was unable to stand up and walk after fall. Pt states she layed outside all night. C/o bilateral leg pain upon arrival. She is alert and answering questions appropriately. No injuries noted from fall.

## 2014-03-06 NOTE — ED Provider Notes (Signed)
CSN: [id number]     Arrival date & time 03/06/14  0808 History   First MD Initiated Contact with Patient 03/06/14 [phone]     Chief Complaint  Patient presents with  . Fall     (Consider location/radiation/quality/duration/timing/severity/associated sxs/prior Treatment) Patient is a 68 y.o. female presenting with fall. The history is provided by the patient.  Fall Pertinent negatives include no chest pain, no abdominal pain, no headaches and no shortness of breath.   Patient stumbled outside in her yard at approximately 1040 in the evening. Was unable to get back up. She lives in isolated area. Patient stayed outside all night. Patient one son came up of crawled to the street and flagged down people which I EMS involved. EMS noted patient was hypertensive blood pressure 190/100 heart rate was 100. Otherwise patient was completely normal and alert and oriented. Patient here with complaint of some mild back pain and bilateral hip pain. Has a history of left knee pain which she stated it did not cause her fall. There was no loss of consciousness. Patient did not hit her head. EMS when they were on the scene to get her back on her feet and she was able to stand. Past Medical History  Diagnosis Date  . Depression   . Anxiety   . Hypertension   . MVP (mitral valve prolapse)   . Hydrocephalus    Past Surgical History  Procedure Laterality Date  . Cholecystectomy    . Brain surgery    . Ercp N/A 02/28/2014    Procedure: ENDOSCOPIC RETROGRADE CHOLANGIOPANCREATOGRAPHY (ERCP);  Surgeon: Gatha Mayer, MD;  Location: [facility] ENDOSCOPY;  Service: Endoscopy;  Laterality: N/A;  talked to tiffany from or /ebp  . Esophagogastroduodenoscopy N/A 02/28/2014    Procedure: ESOPHAGOGASTRODUODENOSCOPY (EGD);  Surgeon: Gatha Mayer, MD;  Location: [facility] ENDOSCOPY;  Service: Endoscopy;  Laterality: N/A;   Family History  Problem Relation Age of Onset  . Hypertension Mother   . Heart disease Mother   . Stroke Mother    . Alcohol abuse Father   . Diabetes Father   . Cancer Neg Hx    History  Substance Use Topics  . Smoking status: Never Smoker   . Smokeless tobacco: Never Used  . Alcohol Use: No   OB History   Grav Para Term Preterm Abortions TAB SAB Ect Mult Living                 Review of Systems  Constitutional: Negative for fever.  HENT: Negative for congestion.   Eyes: Negative for visual disturbance.  Respiratory: Negative for shortness of breath.   Cardiovascular: Negative for chest pain.  Gastrointestinal: Negative for nausea, vomiting and abdominal pain.  Genitourinary: Negative for dysuria and hematuria.  Musculoskeletal: Positive for arthralgias, back pain and neck pain.  Skin: Negative for rash.  Neurological: Negative for syncope, speech difficulty, weakness, numbness and headaches.  Hematological: Does not bruise/bleed easily.  Psychiatric/Behavioral: Negative for confusion.      Allergies  Metformin and related; Ace inhibitors; Amitriptyline hcl; Erythromycin; and Penicillins  Home Medications   Prior to Admission medications   Medication Sig Start Date End Date Taking? Authorizing Provider  gabapentin (NEURONTIN) 300 MG capsule Take 300 mg by mouth 2 (two) times daily as needed (pain).   Yes Historical Provider, MD  HYDROcodone-acetaminophen (NORCO/VICODIN) 5-325 MG per tablet Take 1-2 tablets by mouth every 4 (four) hours as needed for moderate pain or severe pain. 03/05/14  Yes Harvie Heck, PA-C  ibuprofen (ADVIL,MOTRIN) 200 MG tablet Take 400 mg by mouth every 6 (six) hours as needed for headache.   Yes Historical Provider, MD  LORazepam (ATIVAN) 1 MG tablet Take 1 mg by mouth every 6 (six) hours as needed for anxiety. Take up to 4 times qd   Yes Historical Provider, MD  losartan (COZAAR) 100 MG tablet Take 100 mg by mouth daily.   Yes Historical Provider, MD  metFORMIN (GLUCOPHAGE) 500 MG tablet Take 1 tablet (500 mg total) by mouth daily with breakfast. 03/03/14  Yes  Thurnell Lose, MD  OLANZapine-FLUoxetine (SYMBYAX) 6-25 MG per capsule Take 1 capsule by mouth at bedtime.   Yes Historical Provider, MD  ranitidine (ZANTAC) 150 MG tablet Take 150 mg by mouth 3 (three) times daily as needed for heartburn.   Yes Historical Provider, MD  solifenacin (VESICARE) 10 MG tablet Take 10 mg by mouth daily.   Yes Historical Provider, MD  Tetrahydrozoline HCl (VISINE OP) Place 2 drops into both eyes 4 (four) times daily as needed (dry eyes).   Yes Historical Provider, MD  tiZANidine (ZANAFLEX) 4 MG tablet Take 4 mg by mouth 2 (two) times daily as needed for muscle spasms.   Yes Historical Provider, MD  Vitamin D, Ergocalciferol, (DRISDOL) 50000 UNITS CAPS capsule Take 50,000 Units by mouth once a week. On Monday   Yes Historical Provider, MD  Vortioxetine HBr (BRINTELLIX) 10 MG TABS Take 10 mg by mouth every morning.    Yes Historical Provider, MD   BP 145/46  Pulse 100  Temp(Src) 98.8 F (37.1 C) (Oral)  Resp 19  SpO2 96% Physical Exam  Nursing note and vitals reviewed. Constitutional: She is oriented to person, place, and time. She appears well-developed and well-nourished. No distress.  HENT:  Head: Normocephalic and atraumatic.  Mouth/Throat: Oropharynx is clear and moist.  Eyes: Conjunctivae and EOM are normal. Pupils are equal, round, and reactive to light.  Neck: Normal range of motion.  Cardiovascular: Normal rate, regular rhythm and normal heart sounds.   Pulmonary/Chest: Effort normal and breath sounds normal. No respiratory distress.  Abdominal: Soft. Bowel sounds are normal. There is no tenderness.  Musculoskeletal: Normal range of motion. She exhibits no tenderness.  Neurological: She is alert and oriented to person, place, and time. No cranial nerve deficit. She exhibits normal muscle tone. Coordination normal.  Skin: Skin is warm. No rash noted.    ED Course  Procedures (including critical care time) Labs Review Labs Reviewed  CBC WITH  DIFFERENTIAL - Abnormal; Notable for the following:    Monocytes Relative 13 (*)    All other components within normal limits  BASIC METABOLIC PANEL - Abnormal; Notable for the following:    Potassium 3.4 (*)    Glucose, Bld 130 (*)    GFR calc non Af Amer 56 (*)    GFR calc Af Amer 65 (*)    All other components within normal limits  CBG MONITORING, ED - Abnormal; Notable for the following:    Glucose-Capillary 119 (*)    All other components within normal limits  URINALYSIS, ROUTINE W REFLEX MICROSCOPIC    Imaging Review Dg Lumbar Spine Complete  03/06/2014   CLINICAL DATA:  Pain post trauma  EXAM: LUMBAR SPINE - COMPLETE 4+ VIEW  COMPARISON:  Lumbar MRI August 23, 2013 and CT abdomen and pelvis with bony reformats February 22, 2014  FINDINGS: Frontal, lateral, spot lumbosacral lateral, and bilateral oblique views were obtained. There is a shunt present with  the shunt catheter tip in the lower left pelvis. There are 5 non-rib-bearing lumbar type vertebral bodies. There is slight anterior wedging of the T12 vertebral body which is stable compared to the recent CT examination but was not appreciable on the prior MR examination. There is no new fracture. There is 7 mm of anterolisthesis of L4 on L5 which is stable compared to prior studies. There is no new spondylolisthesis. There is disc space narrowing at T12-L1 and L1-2. There is milder disc space narrowing at L2-3, essentially stable. There is facet osteoarthritic change at L4-5 and L5-S1 bilaterally.  IMPRESSION: Mild anterior wedging of the T12 vertebral body, stable compared to recent CT but not present on prior MR. No new fracture. Stable grade I/ IV anterolisthesis of L4 on L5. No new spondylolisthesis. Areas of osteoarthritic change, stable. 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   Electronically Signed   By: Lowella Grip M.D.   On: 03/06/2014 10:09   Dg Hip Bilateral W/pelvis  03/06/2014   CLINICAL DATA:   Fall, hip pain.  EXAM: BILATERAL HIP WITH PELVIS - 4+ VIEW  COMPARISON:  CT of the abdomen and pelvis 02/22/2014  FINDINGS: SI joints and hip joints are symmetric and unremarkable. No acute bony abnormality. Specifically, no fracture, subluxation, or dislocation. Soft tissues are intact.  The distal aspect of a ventriculoperitoneal shunt is again seen within the pelvis.  IMPRESSION: No acute bony abnormality.   Electronically Signed   By: Rolm Baptise M.D.   On: 03/06/2014 10:06   Mr Thoracic Spine Wo Contrast  03/05/2014   CLINICAL DATA:  Bilateral leg numbness.  Urinary incontinence.  EXAM: MRI THORACIC AND LUMBAR SPINE WITHOUT CONTRAST  TECHNIQUE: Multiplanar and multiecho pulse sequences of the thoracic and lumbar spine were obtained without intravenous contrast.  COMPARISON:  Lumbar spine MRI 08/23/2013.  FINDINGS: MR THORACIC SPINE FINDINGS  Vertebral alignment is within normal limits. Vertebral body heights are preserved without evidence of compression fracture. Scattered degenerative marrow changes are noted throughout the thoracic spine, most prominently at T11-12. A hemangioma is noted in the L1 vertebral body. Thoracic spinal cord is normal in caliber and signal. Small left paracentral disc protrusions are present at T6-7, T7-8, and T8-9 without stenosis. At T9-10, mild disc bulging and mild-to-moderate left facet hypertrophy result in mild left neural foraminal stenosis. Mild bilateral neural foraminal stenosis is present at T10-11 due to facet hypertrophy. Paraspinal soft tissues are unremarkable.  MR LUMBAR SPINE FINDINGS  Grade 1 anterolisthesis of L4 on L5 is unchanged. There is no lumbar compression fracture. Prominent dorsal epidural fat is again seen throughout the lumbar spine. There is also mild, diffuse narrowing of the lumbar spinal canal due to congenitally short pedicles. Mild multilevel disc desiccation is again seen, most prominently at L3-4 and L4-5. L1 vertebral body hemangioma is  noted. Conus medullaris is normal in signal and terminates at L1. 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 T2 hyperintense lesion in the lower pole the right kidney is unchanged and most likely represents a cyst.  L1-2:  Minimal disc bulge without stenosis.  L2-3: Minimal disc bulging, congenitally short pedicles, prominent dorsal epidural fat, and mild-to-moderate facet and ligamentum flavum hypertrophy without stenosis.  L3-4: Minimal disc bulging, congenitally short pedicles, prominent dorsal epidural fat, and moderate facet and ligamentum flavum hypertrophy result in mild narrowing of the thecal sac, unchanged. No neural foraminal stenosis.  L4-5: Listhesis with uncovering of the disc, congenitally short pedicles, and severe  facet hypertrophy result in severe spinal stenosis and minimal right neural foraminal narrowing, not significantly changed.  L5-S1: Moderate to severe facet arthrosis without significant spinal canal or neural foraminal stenosis, unchanged.  IMPRESSION: MR THORACIC SPINE IMPRESSION  1. Mild thoracic spondylosis without spinal stenosis or cord compression. 2. Mild neural foraminal narrowing on the left at T9-10 and bilaterally at T10-11 due to facet arthrosis.  MR LUMBAR SPINE IMPRESSION  Unchanged appearance of lumbar disc degeneration and facet arthrosis, most pronounced at L4-5 where there is severe spinal stenosis.   Electronically Signed   By: Logan Bores   On: 03/05/2014 21:09   Mr Lumbar Spine Wo Contrast  03/05/2014   CLINICAL DATA:  Bilateral leg numbness.  Urinary incontinence.  EXAM: MRI THORACIC AND LUMBAR SPINE WITHOUT CONTRAST  TECHNIQUE: Multiplanar and multiecho pulse sequences of the thoracic and lumbar spine were obtained without intravenous contrast.  COMPARISON:  Lumbar spine MRI 08/23/2013.  FINDINGS: MR THORACIC SPINE FINDINGS  Vertebral alignment is within normal limits. Vertebral body heights are preserved without evidence of compression fracture. Scattered degenerative  marrow changes are noted throughout the thoracic spine, most prominently at T11-12. A hemangioma is noted in the L1 vertebral body. Thoracic spinal cord is normal in caliber and signal. Small left paracentral disc protrusions are present at T6-7, T7-8, and T8-9 without stenosis. At T9-10, mild disc bulging and mild-to-moderate left facet hypertrophy result in mild left neural foraminal stenosis. Mild bilateral neural foraminal stenosis is present at T10-11 due to facet hypertrophy. Paraspinal soft tissues are unremarkable.  MR LUMBAR SPINE FINDINGS  Grade 1 anterolisthesis of L4 on L5 is unchanged. There is no lumbar compression fracture. Prominent dorsal epidural fat is again seen throughout the lumbar spine. There is also mild, diffuse narrowing of the lumbar spinal canal due to congenitally short pedicles. Mild multilevel disc desiccation is again seen, most prominently at L3-4 and L4-5. L1 vertebral body hemangioma is noted. Conus medullaris is normal in signal and terminates at L1. 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 T2 hyperintense lesion in the lower pole the right kidney is unchanged and most likely represents a cyst.  L1-2:  Minimal disc bulge without stenosis.  L2-3: Minimal disc bulging, congenitally short pedicles, prominent dorsal epidural fat, and mild-to-moderate facet and ligamentum flavum hypertrophy without stenosis.  L3-4: Minimal disc bulging, congenitally short pedicles, prominent dorsal epidural fat, and moderate facet and ligamentum flavum hypertrophy result in mild narrowing of the thecal sac, unchanged. No neural foraminal stenosis.  L4-5: Listhesis with uncovering of the disc, congenitally short pedicles, and severe facet hypertrophy result in severe spinal stenosis and minimal right neural foraminal narrowing, not significantly changed.  L5-S1: Moderate to severe facet arthrosis without significant spinal canal or neural foraminal stenosis, unchanged.  IMPRESSION: MR THORACIC SPINE  IMPRESSION  1. Mild thoracic spondylosis without spinal stenosis or cord compression. 2. Mild neural foraminal narrowing on the left at T9-10 and bilaterally at T10-11 due to facet arthrosis.  MR LUMBAR SPINE IMPRESSION  Unchanged appearance of lumbar disc degeneration and facet arthrosis, most pronounced at L4-5 where there is severe spinal stenosis.   Electronically Signed   By: Logan Bores   On: 03/05/2014 21:09   Dg Knee Complete 4 Views Left  03/05/2014   CLINICAL DATA:  Fall, knee numbness, predominately laterally.  EXAM: LEFT KNEE - COMPLETE 4+ VIEW  COMPARISON:  None.  FINDINGS: No acute fracture deformity or dislocation. Tricompartmental osteoarthrosis, severe within the patellofemoral, moderate to severe within the  medial lateral compartments. No destructive bony lesions. Loose body versus irregular fabella within the popliteal fossa. Soft tissue planes are nonsuspicious.  IMPRESSION: No acute fracture deformity nor dislocation.  Tricompartmental osteoarthrosis, severe within the patellofemoral compartment.   Electronically Signed   By: Elon Alas   On: 03/05/2014 18:46     EKG Interpretation   Date/Time:  Wednesday March 06 2014 08:15:27 EDT Ventricular Rate:  97 PR Interval:  180 QRS Duration: 97 QT Interval:  393 QTC Calculation: 499 R Axis:   -8 Text Interpretation:  Sinus rhythm Atrial premature complexes LVH with  secondary repolarization abnormality Artifact No significant change since  last tracing Confirmed by Dorotha Hirschi  MD, Luken Shadowens ([id number]) on 03/06/2014 8:23:36  AM      MDM   Final diagnoses:  Fall, initial encounter  Dehydration   Patient status post fall no loss of consciousness last evening lives in an isolated area. Patient not able to get help until morning slept outside. Patient did not hit her head. Did not fall because of her history of left knee pain. Was complaining of some hip pain low back pain x-rays of that area negative. Patient has been and the  walking since the fall. Patient will be discharged home. No evidence of any acute injuries. Some mild dehydration. No evidence of any myoglobinuria.     Fredia Sorrow, MD 03/06/14 [phone]

## 2014-03-06 NOTE — ED Notes (Signed)
Per EMS, pt fell last night in her yard at approximately 10:40pm, she was unable to stand back up on her own or call anyone for help so she stayed outside in the driveway all night. Pt c/o pain bilaterally in her legs 7/10. Hypertensive on EMS arrival at 190/100, HR: 100, resp 20. Pt alert and oriented X4.

## 2014-03-06 NOTE — ED Notes (Signed)
CBG 119 

## 2014-03-08 ENCOUNTER — Ambulatory Visit: Payer: Self-pay | Admitting: Internal Medicine

## 2014-03-08 DIAGNOSIS — Z0289 Encounter for other administrative examinations: Secondary | ICD-10-CM

## 2014-03-09 NOTE — ED Provider Notes (Signed)
Medical screening examination/treatment/procedure(s) were performed by non-physician practitioner and as supervising physician I was immediately available for consultation/collaboration.   EKG Interpretation None       Babette Relic, MD 03/09/14 [phone]

## 2014-03-10 DIAGNOSIS — M6250 Muscle wasting and atrophy, not elsewhere classified, unspecified site: Secondary | ICD-10-CM | POA: Diagnosis not present

## 2014-03-10 DIAGNOSIS — R5383 Other fatigue: Secondary | ICD-10-CM | POA: Diagnosis not present

## 2014-03-10 DIAGNOSIS — R5381 Other malaise: Secondary | ICD-10-CM | POA: Diagnosis not present

## 2014-03-10 DIAGNOSIS — Y929 Unspecified place or not applicable: Secondary | ICD-10-CM | POA: Insufficient documentation

## 2014-03-10 DIAGNOSIS — IMO0002 Reserved for concepts with insufficient information to code with codable children: Secondary | ICD-10-CM | POA: Diagnosis present

## 2014-03-10 DIAGNOSIS — Z79899 Other long term (current) drug therapy: Secondary | ICD-10-CM | POA: Diagnosis not present

## 2014-03-10 DIAGNOSIS — Z9181 History of falling: Secondary | ICD-10-CM | POA: Insufficient documentation

## 2014-03-10 DIAGNOSIS — Z87828 Personal history of other (healed) physical injury and trauma: Secondary | ICD-10-CM | POA: Diagnosis not present

## 2014-03-10 DIAGNOSIS — Z8669 Personal history of other diseases of the nervous system and sense organs: Secondary | ICD-10-CM | POA: Insufficient documentation

## 2014-03-10 DIAGNOSIS — F3289 Other specified depressive episodes: Secondary | ICD-10-CM | POA: Insufficient documentation

## 2014-03-10 DIAGNOSIS — K219 Gastro-esophageal reflux disease without esophagitis: Secondary | ICD-10-CM | POA: Insufficient documentation

## 2014-03-10 DIAGNOSIS — Y9389 Activity, other specified: Secondary | ICD-10-CM | POA: Diagnosis not present

## 2014-03-10 DIAGNOSIS — E669 Obesity, unspecified: Secondary | ICD-10-CM | POA: Insufficient documentation

## 2014-03-10 DIAGNOSIS — W06XXXA Fall from bed, initial encounter: Secondary | ICD-10-CM | POA: Diagnosis not present

## 2014-03-10 DIAGNOSIS — F329 Major depressive disorder, single episode, unspecified: Secondary | ICD-10-CM | POA: Diagnosis not present

## 2014-03-10 DIAGNOSIS — E119 Type 2 diabetes mellitus without complications: Secondary | ICD-10-CM | POA: Diagnosis not present

## 2014-03-10 DIAGNOSIS — Z880 Allergy status to penicillin: Secondary | ICD-10-CM | POA: Insufficient documentation

## 2014-03-10 DIAGNOSIS — I10 Essential (primary) hypertension: Secondary | ICD-10-CM | POA: Insufficient documentation

## 2014-03-10 DIAGNOSIS — Z87448 Personal history of other diseases of urinary system: Secondary | ICD-10-CM | POA: Diagnosis not present

## 2014-03-10 DIAGNOSIS — R42 Dizziness and giddiness: Secondary | ICD-10-CM | POA: Insufficient documentation

## 2014-03-10 DIAGNOSIS — F411 Generalized anxiety disorder: Secondary | ICD-10-CM | POA: Insufficient documentation

## 2014-03-10 NOTE — ED Notes (Signed)
Per [facility] pt fell 2 days ago and then fell again today. Upon EMS arrival pt was naked stating to them she fell while trying to get ready for bed initially denying pain. EMS was getting ready to depart from her house when pt requested to be transported to ED for her management of her chronic all over body pain that is not related to her recent falls. No pan when palpating neck and back, pt ambulatory on scene. Pt reported her falls were due to "ongoing balance issues." VS SBP 170 palpated, HR 80, RR 18

## 2014-03-11 ENCOUNTER — Emergency Department (HOSPITAL_COMMUNITY): Payer: PRIVATE HEALTH INSURANCE

## 2014-03-11 ENCOUNTER — Encounter (HOSPITAL_COMMUNITY): Payer: Self-pay | Admitting: Emergency Medicine

## 2014-03-11 ENCOUNTER — Emergency Department (HOSPITAL_COMMUNITY)
Admission: EM | Admit: 2014-03-11 | Discharge: 2014-03-11 | Disposition: A | Discharge status: Home / Self Care | Payer: PRIVATE HEALTH INSURANCE | Attending: Emergency Medicine | Admitting: Emergency Medicine

## 2014-03-11 DIAGNOSIS — R531 Weakness: Secondary | ICD-10-CM

## 2014-03-11 DIAGNOSIS — R29898 Other symptoms and signs involving the musculoskeletal system: Secondary | ICD-10-CM

## 2014-03-11 HISTORY — DX: Hypotension, unspecified: I95.9

## 2014-03-11 HISTORY — DX: Repeated falls: R29.6

## 2014-03-11 HISTORY — DX: Gastro-esophageal reflux disease without esophagitis: K21.9

## 2014-03-11 HISTORY — DX: Dizziness and giddiness: R42

## 2014-03-11 HISTORY — DX: Elevation of levels of liver transaminase levels: R74.01

## 2014-03-11 HISTORY — DX: Unspecified fall, initial encounter: W19.XXXA

## 2014-03-11 HISTORY — DX: Bradycardia, unspecified: R00.1

## 2014-03-11 HISTORY — DX: Nonspecific elevation of levels of transaminase and lactic acid dehydrogenase (ldh): R74.0

## 2014-03-11 HISTORY — DX: Acute kidney failure, unspecified: N17.9

## 2014-03-11 LAB — COMPREHENSIVE METABOLIC PANEL
ALT: 115 U/L — ABNORMAL HIGH (ref 0–35)
AST: 220 U/L — ABNORMAL HIGH (ref 0–37)
Albumin: 3.2 g/dL — ABNORMAL LOW (ref 3.5–5.2)
Alkaline Phosphatase: 202 U/L — ABNORMAL HIGH (ref 39–117)
Anion gap: 14 (ref 5–15)
BUN: 7 mg/dL (ref 6–23)
CO2: 28 mEq/L (ref 19–32)
Calcium: 9 mg/dL (ref 8.4–10.5)
Chloride: 100 mEq/L (ref 96–112)
Creatinine, Ser: 0.79 mg/dL (ref 0.50–1.10)
GFR calc Af Amer: >90 mL/min (ref >90)
GFR calc non Af Amer: 84 mL/min — ABNORMAL LOW (ref >90)
Glucose, Bld: 122 mg/dL — ABNORMAL HIGH (ref 70–99)
Potassium: 2.9 mEq/L — CL (ref 3.7–5.3)
Sodium: 142 mEq/L (ref 137–147)
Total Bilirubin: 1.9 mg/dL — ABNORMAL HIGH (ref 0.3–1.2)
Total Protein: 6.9 g/dL (ref 6.0–8.3)

## 2014-03-11 LAB — RAPID URINE DRUG SCREEN, HOSP PERFORMED
Amphetamines: POSITIVE — AB
Barbiturates: NOT DETECTED
Benzodiazepines: NOT DETECTED
Cocaine: NOT DETECTED
Opiates: NOT DETECTED
Tetrahydrocannabinol: NOT DETECTED

## 2014-03-11 LAB — URINALYSIS, ROUTINE W REFLEX MICROSCOPIC
Bilirubin Urine: NEGATIVE
Glucose, UA: NEGATIVE mg/dL
Hgb urine dipstick: NEGATIVE
Ketones, ur: NEGATIVE mg/dL
Leukocytes, UA: NEGATIVE
Nitrite: NEGATIVE
Protein, ur: NEGATIVE mg/dL
Specific Gravity, Urine: 1.01 (ref 1.005–1.030)
Urobilinogen, UA: 4 mg/dL — ABNORMAL HIGH (ref 0.0–1.0)
pH: 7 (ref 5.0–8.0)

## 2014-03-11 LAB — CBC WITH DIFFERENTIAL/PLATELET
Basophils Absolute: 0 10*3/uL (ref 0.0–0.1)
Basophils Relative: 1 % (ref 0–1)
Eosinophils Absolute: 0.2 10*3/uL (ref 0.0–0.7)
Eosinophils Relative: 4 % (ref 0–5)
HCT: 39 % (ref 36.0–46.0)
Hemoglobin: 12.8 g/dL (ref 12.0–15.0)
Lymphocytes Relative: 32 % (ref 12–46)
Lymphs Abs: 1.4 10*3/uL (ref 0.7–4.0)
MCH: 30.6 pg (ref 26.0–34.0)
MCHC: 32.8 g/dL (ref 30.0–36.0)
MCV: 93.3 fL (ref 78.0–100.0)
Monocytes Absolute: 0.4 10*3/uL (ref 0.1–1.0)
Monocytes Relative: 9 % (ref 3–12)
Neutro Abs: 2.3 10*3/uL (ref 1.7–7.7)
Neutrophils Relative %: 54 % (ref 43–77)
Platelets: 190 10*3/uL (ref 150–400)
RBC: 4.18 MIL/uL (ref 3.87–5.11)
RDW: 14.1 % (ref 11.5–15.5)
WBC: 4.2 10*3/uL (ref 4.0–10.5)

## 2014-03-11 LAB — ACETAMINOPHEN LEVEL: Acetaminophen (Tylenol), Serum: <15 ug/mL (ref 10–30)

## 2014-03-11 LAB — TROPONIN I: Troponin I: <0.3 ng/mL (ref <0.30)

## 2014-03-11 LAB — ETHANOL: Alcohol, Ethyl (B): <11 mg/dL (ref 0–11)

## 2014-03-11 LAB — SALICYLATE LEVEL: Salicylate Lvl: <2 mg/dL — ABNORMAL LOW (ref 2.8–20.0)

## 2014-03-11 MED ORDER — POTASSIUM CHLORIDE CRYS ER 20 MEQ PO TBCR
40.0000 meq | EXTENDED_RELEASE_TABLET | Freq: Once | ORAL | Status: AC
  Administered 2014-03-11: 40 meq via ORAL
  Filled 2014-03-11: qty 2

## 2014-03-11 MED ORDER — POTASSIUM CHLORIDE 10 MEQ/100ML IV SOLN: 10.0000 meq | INTRAVENOUS | Status: DC

## 2014-03-11 MED ORDER — ACETAMINOPHEN 325 MG PO TABS
650.0000 mg | ORAL_TABLET | Freq: Once | ORAL | Status: AC
  Administered 2014-03-11: 650 mg via ORAL
  Filled 2014-03-11: qty 2

## 2014-03-11 NOTE — ED Notes (Signed)
Pt ambulated in the hallway with walker assistance. Patient needed assistance getting in & out of the bed. Patient continues to have incontinent episodes.

## 2014-03-11 NOTE — Progress Notes (Addendum)
  CARE MANAGEMENT ED NOTE 03/11/2014  Patient:  KAMALA, KOLTON   Account Number:  [id number]  Date Initiated:  03/11/2014  Documentation initiated by:  Jackelyn Poling  Subjective/Objective Assessment:   68 yr old [employer] medicare [employer] covered [place] pt c/o fall 03/08/14 and fall while naked on 03/10/14 attempting to get ready for bed per EMS Pt requested transport to ED for chronic all over body pain not related to     Subjective/Objective Assessment Detail:   recent falls but "ongoing balance issues c/o bilateral knee & back pain pcp Janith Lima  Pt with 3 [facility] ED visits ans d2 admission in last 6 months  Pt has refused facility placement per SW, Jessie Pt stated to have a RW, no bedside commode or shower stool  Pt noted on 8/10 to walk in ED hallway with walker assistance and incontinent episodes  ED RN & SW reports pt ready to go and has a ride home States pt agrees to referral for home health services via [facility]  Anne Ng - Pt's neighbor - [phone]     Action/Plan:   ED CM received a voice message at 1017 from [place], [place] covering [facility] ED about need for assist with Home health services & DME Returned call & spoke with Levada Dy [facility] ED RN & Evelena Peat SW spoke with EDP [phone] Reather Converse   Action/Plan Detail:   1052 spoke with Colletta [place] [facility] staff [phone] for home health referral for [facility] [place] [place] EDP orders   Anticipated DC Date:  03/11/2014     Status Recommendation to Physician:   Result of Recommendation:    Other ED Services  Consult Working Plan   In-house referral  Clinical Social Worker   DC Forensic scientist  Other  Outpatient Services - Pt will follow up    Choice offered to / List presented to:    DME arranged  [place]     DME agency  [place] arranged  HH-1 RN  [place].    Status of service:   Completed, signed off  ED Comments:   ED Comments Detail:    03/11/14 1319 attempt to contact pt at home number [phone] but no answer. Telephone rang until an automated voice message stated "memory is full, enter remote access code" 1250 Noted orders completed by EDP Cm confirmed orders for [facility], PT, SW with [facility]  03/11/14 1139 attempt to contact pt at home number [phone] but no answer

## 2014-03-11 NOTE — ED Notes (Signed)
Have made several attempts to obtain urine from pt but pt refuses to give sample.

## 2014-03-11 NOTE — Discharge Instructions (Signed)
Followup with physical therapy, social work and nursing via home health. If you were given medicines take as directed.  If you are on coumadin or contraceptives realize their levels and effectiveness is altered by many different medicines.  If you have any reaction (rash, tongues swelling, other) to the medicines stop taking and see a physician.   Please follow up as directed and return to the ER or see a physician for new or worsening symptoms.  Thank you. Filed Vitals:   03/11/14 0900 03/11/14 0930 03/11/14 1009 03/11/14 1030  BP: 142/60 121/54 136/64 131/64  Pulse: 71 67 85 69  Temp:      TempSrc:      Resp: 19 19 19 18   Height:      Weight:      SpO2: 91% 91% 99% 91%

## 2014-03-11 NOTE — ED Notes (Signed)
Notified Dr. Mardelle Matte about critical potassium level.

## 2014-03-11 NOTE — ED Notes (Signed)
Called lab to add Salicylate level and acetaminophen level.

## 2014-03-11 NOTE — ED Notes (Signed)
Pt presents with bilateral knee pain and back pain due to falling from a chair while sitting at her kitchen table approx 1930 this evening. Pt states she went to get up and she lost her balance and "fell forward landing on her back." pt alert and oriented x4, no neuro deficits noted

## 2014-03-11 NOTE — ED Notes (Signed)
Pt states she does not have to urinate at this time ?

## 2014-03-11 NOTE — ED Provider Notes (Signed)
CSN: [id number]     Arrival date & time 03/10/14  2331 History   First MD Initiated Contact with Patient 03/11/14 0125     Chief Complaint  Patient presents with  . Muscle Pain  . Fall     (Consider location/radiation/quality/duration/timing/severity/associated sxs/prior Treatment) HPI Patient presented to the emergency department with a ground-level fall on 8/5. Had extensive workup including MRI of the lumbar spine as well as x-rays. No acute injury was found. Was discharged home. Patient states that she has had ongoing low back pain since the fall. She fell again today onto her bilateral knees. When initially trying to evaluate the patient she was very drowsy. She initially stated that she had no pain. She denied striking her head. Denied taking any medication or drugs. Denies any focal weakness. She states she's been falling because she is getting lightheaded. She has no focal weakness or numbness. She denies any neck pain. Past Medical History  Diagnosis Date  . Depression   . Anxiety   . Hypertension   . MVP (mitral valve prolapse)   . Hydrocephalus    Past Surgical History  Procedure Laterality Date  . Cholecystectomy    . Brain surgery    . Ercp N/A 02/28/2014    Procedure: ENDOSCOPIC RETROGRADE CHOLANGIOPANCREATOGRAPHY (ERCP);  Surgeon: Gatha Mayer, MD;  Location: [facility] ENDOSCOPY;  Service: Endoscopy;  Laterality: N/A;  talked to tiffany from or /ebp  . Esophagogastroduodenoscopy N/A 02/28/2014    Procedure: ESOPHAGOGASTRODUODENOSCOPY (EGD);  Surgeon: Gatha Mayer, MD;  Location: [facility] ENDOSCOPY;  Service: Endoscopy;  Laterality: N/A;   Family History  Problem Relation Age of Onset  . Hypertension Mother   . Heart disease Mother   . Stroke Mother   . Alcohol abuse Father   . Diabetes Father   . Cancer Neg Hx    History  Substance Use Topics  . Smoking status: Never Smoker   . Smokeless tobacco: Never Used  . Alcohol Use: No   OB History   Grav Para Term Preterm  Abortions TAB SAB Ect Mult Living                 Review of Systems  Constitutional: Negative for fever and chills.  Eyes: Negative for visual disturbance.  Respiratory: Negative for cough and shortness of breath.   Cardiovascular: Negative for chest pain.  Gastrointestinal: Negative for nausea, vomiting and abdominal pain.  Genitourinary: Negative for dysuria, hematuria and flank pain.  Musculoskeletal: Positive for arthralgias and back pain. Negative for neck pain and neck stiffness.  Skin: Negative for rash and wound.  Neurological: Positive for dizziness and light-headedness. Negative for weakness, numbness and headaches.  All other systems reviewed and are negative.     Allergies  Metformin and related; Ace inhibitors; Amitriptyline hcl; Erythromycin; and Penicillins  Home Medications   Prior to Admission medications   Medication Sig Start Date End Date Taking? Authorizing Provider  gabapentin (NEURONTIN) 300 MG capsule Take 300 mg by mouth 2 (two) times daily as needed (pain).    Historical Provider, MD  HYDROcodone-acetaminophen (NORCO/VICODIN) 5-325 MG per tablet Take 1-2 tablets by mouth every 4 (four) hours as needed for moderate pain or severe pain. 03/05/14   Harvie Heck, PA-C  ibuprofen (ADVIL,MOTRIN) 200 MG tablet Take 400 mg by mouth every 6 (six) hours as needed for headache.    Historical Provider, MD  LORazepam (ATIVAN) 1 MG tablet Take 1 mg by mouth every 6 (six) hours as needed for anxiety.  Take up to 4 times qd    Historical Provider, MD  losartan (COZAAR) 100 MG tablet Take 100 mg by mouth daily.    Historical Provider, MD  metFORMIN (GLUCOPHAGE) 500 MG tablet Take 1 tablet (500 mg total) by mouth daily with breakfast. 03/03/14   Thurnell Lose, MD  OLANZapine-FLUoxetine (SYMBYAX) 6-25 MG per capsule Take 1 capsule by mouth at bedtime.    Historical Provider, MD  ranitidine (ZANTAC) 150 MG tablet Take 150 mg by mouth 3 (three) times daily as needed for  heartburn.    Historical Provider, MD  solifenacin (VESICARE) 10 MG tablet Take 10 mg by mouth daily.    Historical Provider, MD  Tetrahydrozoline HCl (VISINE OP) Place 2 drops into both eyes 4 (four) times daily as needed (dry eyes).    Historical Provider, MD  tiZANidine (ZANAFLEX) 4 MG tablet Take 4 mg by mouth 2 (two) times daily as needed for muscle spasms.    Historical Provider, MD  Vitamin D, Ergocalciferol, (DRISDOL) 50000 UNITS CAPS capsule Take 50,000 Units by mouth once a week. On Monday    Historical Provider, MD  Vortioxetine HBr (BRINTELLIX) 10 MG TABS Take 10 mg by mouth every morning.     Historical Provider, MD   BP 131/59  Pulse 88  Temp(Src) 98.4 F (36.9 C) (Oral)  Resp 16  Ht 5\' 5"  (1.651 m)  Wt 246 lb (111.585 kg)  BMI 40.94 kg/m2  SpO2 94% Physical Exam  Nursing note and vitals reviewed. Constitutional: She is oriented to person, place, and time. She appears well-developed and well-nourished. No distress.  Obese and drowsy  HENT:  Head: Normocephalic and atraumatic.  Mouth/Throat: Oropharynx is clear and moist.  Eyes: EOM are normal. Pupils are equal, round, and reactive to light.  Neck: Normal range of motion. Neck supple.  No posterior midline cervical tenderness to palpation. Full range of motion without meningismus.  Cardiovascular: Normal rate and regular rhythm.  Exam reveals no gallop and no friction rub.   No murmur heard. Pulmonary/Chest: Effort normal and breath sounds normal. No respiratory distress. She has no wheezes. She has no rales. She exhibits no tenderness.  Abdominal: Soft. Bowel sounds are normal. She exhibits no distension and no mass. There is no tenderness. There is no rebound and no guarding.  Musculoskeletal: Normal range of motion. She exhibits no edema and no tenderness.  Pelvis is stable. No thoracic tenderness to palpation. Diffuse lumbar tenderness. Both midline and paraspinal. Chest full range of motion of both knees. No evidence  of any trauma. No swelling or crepitance. Distal pulses are intact.  Neurological: She is oriented to person, place, and time.  Drowsy but arousable. 5/5 motor in all extremities. Sensation is intact.  Skin: Skin is warm and dry. No rash noted. No erythema.  Psychiatric: She has a normal mood and affect. Her behavior is normal.    ED Course  Procedures (including critical care time) Labs Review Labs Reviewed  CBC WITH DIFFERENTIAL  COMPREHENSIVE METABOLIC PANEL  URINALYSIS, ROUTINE W REFLEX MICROSCOPIC  TROPONIN I  ETHANOL  URINE RAPID DRUG SCREEN (HOSP PERFORMED)    Imaging Review No results found.   EKG Interpretation None      MDM   Final diagnoses:  None    Patient with increased drowsiness and lightheadedness and a history of multiple falls. Thorough workup for injury when she presented to the emergency Department 8/5. Concern for possible pharmacy causing the patient's mental status changes. We'll evaluate for  other possible causes.   it is now more alert. Her vital signs remained normal. Workup is essentially negative. She continues to have elevated liver enzymes from recent pertaining, bile duct stone. This was removed during her last admission. She has gastroenterology followup. She's complaining of no abdominal pain nausea or vomiting.  Julianne Rice, MD 03/12/14 [phone]

## 2014-03-11 NOTE — Clinical Social Work Note (Signed)
Clinical Social Worker received notification from RN that patient will need home health vs. SNF placement prior to discharge home.  CSW met with patient and RN at bedside and patient adamantly refuses SNF placement but is agreeable with home health.  CSW left message with EDCM Joellyn Quails) regarding home health arrangements and equipment needs.  CSW to await return call to assist and facilitate patient discharge home.  Per RN, patient with transportation home by neighbor.  RN notified MD of patient discharge plans.  CSW remains available for support.  Barbette Or, [place]

## 2014-03-11 NOTE — ED Notes (Signed)
EKG given to Dr. Mardelle Matte.

## 2014-03-11 NOTE — ED Notes (Signed)
pts ride has been called and waiting for her arrival to discharge pt

## 2014-03-11 NOTE — ED Notes (Signed)
Anne Ng - Pt's neighbor - [phone]

## 2014-03-12 ENCOUNTER — Telehealth: Payer: Self-pay | Admitting: Internal Medicine

## 2014-03-12 NOTE — Telephone Encounter (Signed)
Patient no showed for hospital follow up on 08/07.  Please advise.

## 2014-03-12 NOTE — Telephone Encounter (Signed)
Noted  

## 2014-03-18 ENCOUNTER — Ambulatory Visit: Payer: PRIVATE HEALTH INSURANCE | Admitting: Internal Medicine

## 2014-03-18 DIAGNOSIS — Z0289 Encounter for other administrative examinations: Secondary | ICD-10-CM

## 2014-03-22 ENCOUNTER — Encounter (HOSPITAL_COMMUNITY): Payer: Self-pay | Admitting: Emergency Medicine

## 2014-03-22 ENCOUNTER — Emergency Department (HOSPITAL_COMMUNITY): Payer: PRIVATE HEALTH INSURANCE

## 2014-03-22 ENCOUNTER — Ambulatory Visit: Payer: PRIVATE HEALTH INSURANCE | Admitting: Internal Medicine

## 2014-03-22 ENCOUNTER — Inpatient Hospital Stay (HOSPITAL_COMMUNITY)
Admission: EM | Admit: 2014-03-22 | Discharge: 2014-03-25 | DRG: 689 | Disposition: A | Discharge status: Home Health | Payer: PRIVATE HEALTH INSURANCE | Attending: Internal Medicine | Admitting: Internal Medicine

## 2014-03-22 DIAGNOSIS — Z1231 Encounter for screening mammogram for malignant neoplasm of breast: Secondary | ICD-10-CM

## 2014-03-22 DIAGNOSIS — R1310 Dysphagia, unspecified: Secondary | ICD-10-CM

## 2014-03-22 DIAGNOSIS — E872 Acidosis, unspecified: Secondary | ICD-10-CM | POA: Diagnosis present

## 2014-03-22 DIAGNOSIS — Z881 Allergy status to other antibiotic agents status: Secondary | ICD-10-CM

## 2014-03-22 DIAGNOSIS — D510 Vitamin B12 deficiency anemia due to intrinsic factor deficiency: Secondary | ICD-10-CM | POA: Diagnosis present

## 2014-03-22 DIAGNOSIS — E538 Deficiency of other specified B group vitamins: Secondary | ICD-10-CM

## 2014-03-22 DIAGNOSIS — N3281 Overactive bladder: Secondary | ICD-10-CM

## 2014-03-22 DIAGNOSIS — W19XXXA Unspecified fall, initial encounter: Secondary | ICD-10-CM | POA: Diagnosis present

## 2014-03-22 DIAGNOSIS — R7989 Other specified abnormal findings of blood chemistry: Secondary | ICD-10-CM | POA: Diagnosis present

## 2014-03-22 DIAGNOSIS — Z791 Long term (current) use of non-steroidal anti-inflammatories (NSAID): Secondary | ICD-10-CM | POA: Diagnosis not present

## 2014-03-22 DIAGNOSIS — Z833 Family history of diabetes mellitus: Secondary | ICD-10-CM | POA: Diagnosis not present

## 2014-03-22 DIAGNOSIS — Z982 Presence of cerebrospinal fluid drainage device: Secondary | ICD-10-CM | POA: Diagnosis not present

## 2014-03-22 DIAGNOSIS — Z8249 Family history of ischemic heart disease and other diseases of the circulatory system: Secondary | ICD-10-CM

## 2014-03-22 DIAGNOSIS — R531 Weakness: Secondary | ICD-10-CM | POA: Diagnosis present

## 2014-03-22 DIAGNOSIS — Z0000 Encounter for general adult medical examination without abnormal findings: Secondary | ICD-10-CM

## 2014-03-22 DIAGNOSIS — N289 Disorder of kidney and ureter, unspecified: Secondary | ICD-10-CM

## 2014-03-22 DIAGNOSIS — G9341 Metabolic encephalopathy: Secondary | ICD-10-CM | POA: Diagnosis present

## 2014-03-22 DIAGNOSIS — Z9181 History of falling: Secondary | ICD-10-CM

## 2014-03-22 DIAGNOSIS — R740 Nonspecific elevation of levels of transaminase and lactic acid dehydrogenase [LDH]: Secondary | ICD-10-CM

## 2014-03-22 DIAGNOSIS — E1142 Type 2 diabetes mellitus with diabetic polyneuropathy: Secondary | ICD-10-CM | POA: Diagnosis present

## 2014-03-22 DIAGNOSIS — Z880 Allergy status to penicillin: Secondary | ICD-10-CM | POA: Diagnosis not present

## 2014-03-22 DIAGNOSIS — N318 Other neuromuscular dysfunction of bladder: Secondary | ICD-10-CM | POA: Diagnosis present

## 2014-03-22 DIAGNOSIS — I10 Essential (primary) hypertension: Secondary | ICD-10-CM | POA: Diagnosis present

## 2014-03-22 DIAGNOSIS — M542 Cervicalgia: Secondary | ICD-10-CM

## 2014-03-22 DIAGNOSIS — R4182 Altered mental status, unspecified: Secondary | ICD-10-CM | POA: Diagnosis present

## 2014-03-22 DIAGNOSIS — Z823 Family history of stroke: Secondary | ICD-10-CM

## 2014-03-22 DIAGNOSIS — Z888 Allergy status to other drugs, medicaments and biological substances status: Secondary | ICD-10-CM | POA: Diagnosis not present

## 2014-03-22 DIAGNOSIS — R5383 Other fatigue: Secondary | ICD-10-CM

## 2014-03-22 DIAGNOSIS — I059 Rheumatic mitral valve disease, unspecified: Secondary | ICD-10-CM | POA: Diagnosis present

## 2014-03-22 DIAGNOSIS — E559 Vitamin D deficiency, unspecified: Secondary | ICD-10-CM

## 2014-03-22 DIAGNOSIS — E1149 Type 2 diabetes mellitus with other diabetic neurological complication: Secondary | ICD-10-CM | POA: Diagnosis present

## 2014-03-22 DIAGNOSIS — R3981 Functional urinary incontinence: Secondary | ICD-10-CM | POA: Diagnosis present

## 2014-03-22 DIAGNOSIS — N179 Acute kidney failure, unspecified: Secondary | ICD-10-CM | POA: Diagnosis present

## 2014-03-22 DIAGNOSIS — R7401 Elevation of levels of liver transaminase levels: Secondary | ICD-10-CM

## 2014-03-22 DIAGNOSIS — G9340 Encephalopathy, unspecified: Secondary | ICD-10-CM | POA: Diagnosis present

## 2014-03-22 DIAGNOSIS — G910 Communicating hydrocephalus: Secondary | ICD-10-CM

## 2014-03-22 DIAGNOSIS — R945 Abnormal results of liver function studies: Secondary | ICD-10-CM

## 2014-03-22 DIAGNOSIS — M8580 Other specified disorders of bone density and structure, unspecified site: Secondary | ICD-10-CM

## 2014-03-22 DIAGNOSIS — R5381 Other malaise: Secondary | ICD-10-CM

## 2014-03-22 DIAGNOSIS — N390 Urinary tract infection, site not specified: Principal | ICD-10-CM | POA: Diagnosis present

## 2014-03-22 DIAGNOSIS — K8050 Calculus of bile duct without cholangitis or cholecystitis without obstruction: Secondary | ICD-10-CM

## 2014-03-22 DIAGNOSIS — R55 Syncope and collapse: Secondary | ICD-10-CM

## 2014-03-22 DIAGNOSIS — R7309 Other abnormal glucose: Secondary | ICD-10-CM

## 2014-03-22 DIAGNOSIS — Z9049 Acquired absence of other specified parts of digestive tract: Secondary | ICD-10-CM

## 2014-03-22 LAB — URINALYSIS, ROUTINE W REFLEX MICROSCOPIC
Bilirubin Urine: NEGATIVE
Glucose, UA: NEGATIVE mg/dL
Ketones, ur: NEGATIVE mg/dL
Nitrite: POSITIVE — AB
Protein, ur: NEGATIVE mg/dL
Specific Gravity, Urine: 1.012 (ref 1.005–1.030)
Urobilinogen, UA: 1 mg/dL (ref 0.0–1.0)
pH: 5.5 (ref 5.0–8.0)

## 2014-03-22 LAB — URINE MICROSCOPIC-ADD ON

## 2014-03-22 LAB — D-DIMER, QUANTITATIVE: D-Dimer, Quant: <0.27 ug/mL-FEU (ref 0.00–0.48)

## 2014-03-22 LAB — COMPREHENSIVE METABOLIC PANEL
ALT: 72 U/L — ABNORMAL HIGH (ref 0–35)
AST: 107 U/L — ABNORMAL HIGH (ref 0–37)
Albumin: 3 g/dL — ABNORMAL LOW (ref 3.5–5.2)
Alkaline Phosphatase: 136 U/L — ABNORMAL HIGH (ref 39–117)
Anion gap: 13 (ref 5–15)
BUN: 22 mg/dL (ref 6–23)
CO2: 25 mEq/L (ref 19–32)
Calcium: 9.2 mg/dL (ref 8.4–10.5)
Chloride: 102 mEq/L (ref 96–112)
Creatinine, Ser: 2.1 mg/dL — ABNORMAL HIGH (ref 0.50–1.10)
GFR calc Af Amer: 27 mL/min — ABNORMAL LOW (ref >90)
GFR calc non Af Amer: 23 mL/min — ABNORMAL LOW (ref >90)
Glucose, Bld: 130 mg/dL — ABNORMAL HIGH (ref 70–99)
Potassium: 3.9 mEq/L (ref 3.7–5.3)
Sodium: 140 mEq/L (ref 137–147)
Total Bilirubin: 1.7 mg/dL — ABNORMAL HIGH (ref 0.3–1.2)
Total Protein: 6.6 g/dL (ref 6.0–8.3)

## 2014-03-22 LAB — I-STAT TROPONIN, ED: Troponin i, poc: 0 ng/mL (ref 0.00–0.08)

## 2014-03-22 LAB — CBC WITH DIFFERENTIAL/PLATELET
Basophils Absolute: 0 10*3/uL (ref 0.0–0.1)
Basophils Relative: 0 % (ref 0–1)
Eosinophils Absolute: 0.1 10*3/uL (ref 0.0–0.7)
Eosinophils Relative: 1 % (ref 0–5)
HCT: 37.8 % (ref 36.0–46.0)
Hemoglobin: 12.7 g/dL (ref 12.0–15.0)
Lymphocytes Relative: 11 % — ABNORMAL LOW (ref 12–46)
Lymphs Abs: 0.7 10*3/uL (ref 0.7–4.0)
MCH: 32.1 pg (ref 26.0–34.0)
MCHC: 33.6 g/dL (ref 30.0–36.0)
MCV: 95.5 fL (ref 78.0–100.0)
Monocytes Absolute: 0.6 10*3/uL (ref 0.1–1.0)
Monocytes Relative: 9 % (ref 3–12)
Neutro Abs: 5.1 10*3/uL (ref 1.7–7.7)
Neutrophils Relative %: 79 % — ABNORMAL HIGH (ref 43–77)
Platelets: 212 10*3/uL (ref 150–400)
RBC: 3.96 MIL/uL (ref 3.87–5.11)
RDW: 14.3 % (ref 11.5–15.5)
WBC: 6.5 10*3/uL (ref 4.0–10.5)

## 2014-03-22 LAB — I-STAT CG4 LACTIC ACID, ED: Lactic Acid, Venous: 2.22 mmol/L — ABNORMAL HIGH (ref 0.5–2.2)

## 2014-03-22 LAB — CBG MONITORING, ED: Glucose-Capillary: 116 mg/dL — ABNORMAL HIGH (ref 70–99)

## 2014-03-22 MED ORDER — CETYLPYRIDINIUM CHLORIDE 0.05 % MT LIQD
7.0000 mL | Freq: Two times a day (BID) | OROMUCOSAL | Status: DC
  Administered 2014-03-23 – 2014-03-24 (×2): 7 mL via OROMUCOSAL

## 2014-03-22 MED ORDER — ONDANSETRON HCL 4 MG/2ML IJ SOLN
4.0000 mg | Freq: Four times a day (QID) | INTRAMUSCULAR | Status: DC | PRN
  Administered 2014-03-23 – 2014-03-24 (×2): 4 mg via INTRAVENOUS
  Filled 2014-03-22 (×2): qty 2

## 2014-03-22 MED ORDER — DARIFENACIN HYDROBROMIDE ER 15 MG PO TB24
15.0000 mg | ORAL_TABLET | Freq: Every day | ORAL | Status: DC
  Administered 2014-03-23 – 2014-03-25 (×3): 15 mg via ORAL
  Filled 2014-03-22 (×3): qty 1

## 2014-03-22 MED ORDER — DEXTROSE 5 % IV SOLN
1.0000 g | INTRAVENOUS | Status: DC
  Administered 2014-03-23 – 2014-03-24 (×2): 1 g via INTRAVENOUS
  Filled 2014-03-22 (×3): qty 10

## 2014-03-22 MED ORDER — VORTIOXETINE HBR 10 MG PO TABS
10.0000 mg | ORAL_TABLET | Freq: Every day | ORAL | Status: DC
  Administered 2014-03-23 – 2014-03-25 (×3): 10 mg via ORAL
  Filled 2014-03-22 (×3): qty 1

## 2014-03-22 MED ORDER — OLANZAPINE-FLUOXETINE HCL 6-25 MG PO CAPS
1.0000 | ORAL_CAPSULE | Freq: Every day | ORAL | Status: DC
  Administered 2014-03-22 – 2014-03-24 (×3): 1 via ORAL
  Filled 2014-03-22 (×4): qty 1

## 2014-03-22 MED ORDER — ENOXAPARIN SODIUM 30 MG/0.3ML ~~LOC~~ SOLN
30.0000 mg | SUBCUTANEOUS | Status: DC
Start: 2014-03-23 — End: 2014-03-24
  Administered 2014-03-23 – 2014-03-24 (×2): 30 mg via SUBCUTANEOUS
  Filled 2014-03-22 (×2): qty 0.3

## 2014-03-22 MED ORDER — OXYCODONE HCL 5 MG PO TABS
5.0000 mg | ORAL_TABLET | ORAL | Status: DC | PRN
  Administered 2014-03-23 – 2014-03-24 (×3): 5 mg via ORAL
  Filled 2014-03-22 (×4): qty 1

## 2014-03-22 MED ORDER — THIAMINE HCL 100 MG/ML IJ SOLN
100.0000 mg | Freq: Once | INTRAMUSCULAR | Status: AC
  Administered 2014-03-22: 100 mg via INTRAVENOUS
  Filled 2014-03-22: qty 2

## 2014-03-22 MED ORDER — ENOXAPARIN SODIUM 150 MG/ML ~~LOC~~ SOLN: 1.0000 mg/kg | Freq: Two times a day (BID) | SUBCUTANEOUS | Status: DC

## 2014-03-22 MED ORDER — SODIUM CHLORIDE 0.9 % IV SOLN
INTRAVENOUS | Status: DC
  Administered 2014-03-22 – 2014-03-24 (×3): via INTRAVENOUS

## 2014-03-22 MED ORDER — DEXTROSE 5 % IV SOLN
1.0000 g | Freq: Once | INTRAVENOUS | Status: AC
  Administered 2014-03-22: 1 g via INTRAVENOUS
  Filled 2014-03-22: qty 10

## 2014-03-22 MED ORDER — ACETAMINOPHEN 325 MG PO TABS
650.0000 mg | ORAL_TABLET | Freq: Four times a day (QID) | ORAL | Status: DC | PRN
  Administered 2014-03-23 (×2): 650 mg via ORAL
  Filled 2014-03-22 (×2): qty 2

## 2014-03-22 MED ORDER — ONDANSETRON HCL 4 MG PO TABS: 4.0000 mg | ORAL_TABLET | Freq: Four times a day (QID) | ORAL | Status: DC | PRN

## 2014-03-22 MED ORDER — SODIUM CHLORIDE 0.9 % IV BOLUS (SEPSIS)
2000.0000 mL | Freq: Once | INTRAVENOUS | Status: AC
  Administered 2014-03-22: 2000 mL via INTRAVENOUS

## 2014-03-22 MED ORDER — HYDROMORPHONE HCL PF 1 MG/ML IJ SOLN: 0.5000 mg | INTRAMUSCULAR | Status: DC | PRN

## 2014-03-22 MED ORDER — LORAZEPAM 1 MG PO TABS
1.0000 mg | ORAL_TABLET | Freq: Four times a day (QID) | ORAL | Status: DC | PRN
  Administered 2014-03-25: 1 mg via ORAL
  Filled 2014-03-22: qty 1

## 2014-03-22 MED ORDER — SODIUM CHLORIDE 0.9 % IV BOLUS (SEPSIS): 500.0000 mL | Freq: Once | INTRAVENOUS | Status: DC

## 2014-03-22 MED ORDER — ALUM & MAG HYDROXIDE-SIMETH 200-200-20 MG/5ML PO SUSP: 30.0000 mL | Freq: Four times a day (QID) | ORAL | Status: DC | PRN

## 2014-03-22 MED ORDER — ACETAMINOPHEN 650 MG RE SUPP: 650.0000 mg | Freq: Four times a day (QID) | RECTAL | Status: DC | PRN

## 2014-03-22 MED ORDER — SODIUM CHLORIDE 0.9 % IV BOLUS (SEPSIS)
1000.0000 mL | Freq: Once | INTRAVENOUS | Status: AC
  Administered 2014-03-22: 1000 mL via INTRAVENOUS

## 2014-03-22 NOTE — ED Notes (Signed)
Dr. Bednar at bedside. 

## 2014-03-22 NOTE — ED Notes (Signed)
Per EMS, pt fell at home. Uses a walker, EMS reports house was cluttered and unsafe. Pt is disoriented to time and that is abnormal for her. Vitals signs stable.

## 2014-03-22 NOTE — Progress Notes (Signed)
  Pt admitted to the unit. Pt is stable, alert and oriented per baseline. Oriented to room, staff, and call bell. Educated to call for any assistance. Bed in lowest position, call bell within reach- will continue to monitor. 

## 2014-03-22 NOTE — ED Notes (Signed)
Patient transported to CT 

## 2014-03-22 NOTE — Progress Notes (Signed)
2 attempts to call ED.

## 2014-03-22 NOTE — ED Provider Notes (Signed)
CSN: [id number]     Arrival date & time 03/22/14  1544 History   First MD Initiated Contact with Patient 03/22/14 1557     Chief Complaint  Patient presents with  . Fall  . Altered Mental Status   altered mental status   (Consider location/radiation/quality/duration/timing/severity/associated sxs/prior Treatment) HPI 68 year old female presents with several weeks of gradual onset generalized weakness with multiple falls unable to care for herself at home according to friend, patient unable to walk more than a couple steps today, has no change in baseline numbness to both lateral thighs for the last several weeks, patient not oriented to time today which is different than her baseline and has been confused now for several weeks starting after her ED visit when she had an MRI scan of her back for an evaluation of numbness to both of her thighs. Patient denies fever headache chest pain cough shortness breath abdominal pain vomiting diarrhea or lateralizing weakness or new numbness. Past Medical History  Diagnosis Date  . Depression   . Anxiety   . Hypertension   . MVP (mitral valve prolapse)   . Hydrocephalus   . Diabetes mellitus without complication   . Sinus bradycardia   . Dizziness   . Falls   . Acute kidney injury   . Transaminitis   . Hypotension   . Acid reflux disease    Past Surgical History  Procedure Laterality Date  . Cholecystectomy    . Brain surgery    . Ercp N/A 02/28/2014    Procedure: ENDOSCOPIC RETROGRADE CHOLANGIOPANCREATOGRAPHY (ERCP);  Surgeon: Gatha Mayer, MD;  Location: [facility] ENDOSCOPY;  Service: Endoscopy;  Laterality: N/A;  talked to tiffany from or /ebp  . Esophagogastroduodenoscopy N/A 02/28/2014    Procedure: ESOPHAGOGASTRODUODENOSCOPY (EGD);  Surgeon: Gatha Mayer, MD;  Location: [facility] ENDOSCOPY;  Service: Endoscopy;  Laterality: N/A;  . Ventriculoperitoneal shunt      right occipital   Family History  Problem Relation Age of Onset  . Hypertension  Mother   . Heart disease Mother   . Stroke Mother   . Alcohol abuse Father   . Diabetes Father   . Cancer Neg Hx    History  Substance Use Topics  . Smoking status: Never Smoker   . Smokeless tobacco: Never Used  . Alcohol Use: No   OB History   Grav Para Term Preterm Abortions TAB SAB Ect Mult Living                 Review of Systems 10 Systems reviewed and are negative for acute change except as noted in the HPI.   Allergies  Metformin and related; Ace inhibitors; Amitriptyline hcl; Erythromycin; and Penicillins  Home Medications   Prior to Admission medications   Medication Sig Start Date End Date Taking? Authorizing Provider  gabapentin (NEURONTIN) 300 MG capsule Take 300 mg by mouth 3 (three) times daily.    Yes Historical Provider, MD  HYDROcodone-acetaminophen (NORCO/VICODIN) 5-325 MG per tablet Take 1-2 tablets by mouth every 4 (four) hours as needed for moderate pain or severe pain. 03/05/14  Yes Harvie Heck, PA-C  losartan (COZAAR) 100 MG tablet Take 100 mg by mouth daily.   Yes Historical Provider, MD  OLANZapine-FLUoxetine (SYMBYAX) 6-25 MG per capsule Take 2 capsules by mouth at bedtime.    Yes Historical Provider, MD  solifenacin (VESICARE) 10 MG tablet Take 10 mg by mouth daily.   Yes Historical Provider, MD  Vortioxetine HBr (BRINTELLIX) 10 MG TABS Take 10  mg by mouth every morning.    Yes Historical Provider, MD  ciprofloxacin (CILOXAN) 0.3 % ophthalmic solution Place 1 drop into both eyes every 2 (two) hours. Administer 1 drop, every 2 hours, while awake, for 2 days. Then 1 drop, every 4 hours, while awake, for the next 5 days.    Historical Provider, MD  LORazepam (ATIVAN) 1 MG tablet Take 1 mg by mouth 4 (four) times daily as needed. For anxiousness 03/20/14   Historical Provider, MD  metFORMIN (GLUCOPHAGE) 500 MG tablet Take 500 mg by mouth 2 (two) times daily with a meal.    Historical Provider, MD  tiZANidine (ZANAFLEX) 4 MG capsule Take 4 mg by mouth 3  (three) times daily.    Historical Provider, MD   BP 150/66  Pulse 70  Temp(Src) 98.6 F (37 C) (Oral)  Resp 14  Ht 5\' 5"  (1.651 m)  Wt 237 lb 10.5 oz (107.8 kg)  BMI 39.55 kg/m2  SpO2 96% Physical Exam  Nursing note and vitals reviewed. Constitutional:  Awake, alert, nontoxic appearance with baseline speech for patient.  HENT:  Head: Atraumatic.  Mouth/Throat: No oropharyngeal exudate.  Eyes: EOM are normal. Pupils are equal, round, and reactive to light. Right eye exhibits no discharge. Left eye exhibits no discharge.  Neck: Neck supple.  Cardiovascular: Normal rate and regular rhythm.   No murmur heard. Pulmonary/Chest: Effort normal and breath sounds normal. No stridor. No respiratory distress. She has no wheezes. She has no rales. She exhibits no tenderness.  Hypoxic on room air at 87%  Abdominal: Soft. Bowel sounds are normal. She exhibits no mass. There is no tenderness. There is no rebound.  Musculoskeletal: She exhibits no tenderness.  Baseline ROM, moves extremities with no obvious new focal weakness.  Lymphadenopathy:    She has no cervical adenopathy.  Neurological: She is alert.  Awake, alert, cooperative and aware of situation oriented to person and place not time; motor strength 4/5 bilaterally; sensation normal to light touch bilaterally except numbness both lateral thighs; peripheral visual fields full to confrontation; no facial asymmetry; tongue midline; major cranial nerves appear intact; no pronator drift, normal finger to nose bilaterally, too weak to walk upon arrival to ED  Skin: No rash noted.  Psychiatric: She has a normal mood and affect.    ED Course  Procedures (including critical care time) D/w [facility] for admit. 1945 Patient / Family / Caregiver informed of clinical course, understand medical decision-making process, and agree with plan. Labs Review Labs Reviewed  CBC WITH DIFFERENTIAL - Abnormal; Notable for the following:    Neutrophils  Relative % 79 (*)    Lymphocytes Relative 11 (*)    All other components within normal limits  COMPREHENSIVE METABOLIC PANEL - Abnormal; Notable for the following:    Glucose, Bld 130 (*)    Creatinine, Ser 2.10 (*)    Albumin 3.0 (*)    AST 107 (*)    ALT 72 (*)    Alkaline Phosphatase 136 (*)    Total Bilirubin 1.7 (*)    GFR calc non Af Amer 23 (*)    GFR calc Af Amer 27 (*)    All other components within normal limits  URINALYSIS, ROUTINE W REFLEX MICROSCOPIC - Abnormal; Notable for the following:    APPearance CLOUDY (*)    Hgb urine dipstick SMALL (*)    Nitrite POSITIVE (*)    Leukocytes, UA LARGE (*)    All other components within normal limits  URINE MICROSCOPIC-ADD ON - Abnormal; Notable for the following:    Squamous Epithelial / LPF FEW (*)    Bacteria, UA MANY (*)    All other components within normal limits  FOLATE RBC - Abnormal; Notable for the following:    RBC Folate 1147 (*)    All other components within normal limits  BASIC METABOLIC PANEL - Abnormal; Notable for the following:    Glucose, Bld 139 (*)    Creatinine, Ser 1.42 (*)    GFR calc non Af Amer 37 (*)    GFR calc Af Amer 43 (*)    Anion gap 16 (*)    All other components within normal limits  BASIC METABOLIC PANEL - Abnormal; Notable for the following:    Sodium 148 (*)    Potassium 3.4 (*)    Glucose, Bld 122 (*)    GFR calc non Af Amer 52 (*)    GFR calc Af Amer 60 (*)    All other components within normal limits  HEPATIC FUNCTION PANEL - Abnormal; Notable for the following:    Albumin 2.8 (*)    AST 73 (*)    ALT 49 (*)    Alkaline Phosphatase 127 (*)    Bilirubin, Direct 0.6 (*)    All other components within normal limits  BASIC METABOLIC PANEL - Abnormal; Notable for the following:    GFR calc non Af Amer 66 (*)    GFR calc Af Amer 77 (*)    All other components within normal limits  I-STAT CG4 LACTIC ACID, ED - Abnormal; Notable for the following:    Lactic Acid, Venous 2.22 (*)     All other components within normal limits  CBG MONITORING, ED - Abnormal; Notable for the following:    Glucose-Capillary 116 (*)    All other components within normal limits  CULTURE, BLOOD (ROUTINE X 2)  CULTURE, BLOOD (ROUTINE X 2)  URINE CULTURE  D-DIMER, QUANTITATIVE  VITAMIN B12  RPR  HIV ANTIBODY (ROUTINE TESTING)  HEPATITIS PANEL, ACUTE  CBC  SODIUM, URINE, RANDOM  CREATININE, URINE, RANDOM  HEPATITIS PANEL, ACUTE  I-STAT TROPOININ, ED    Imaging Review No results found.   EKG Interpretation   Date/Time:  Friday March 22 2014 18:04:31 EDT Ventricular Rate:  80 PR Interval:  193 QRS Duration: 94 QT Interval:  418 QTC Calculation: 482 R Axis:   -15 Text Interpretation:  Sinus rhythm Left ventricular hypertrophy RSR' or QR  pattern in V1 suggests right ventricular conduction delay No significant  change since last tracing Confirmed by [facility]  MD, Jenny Reichmann ([id number]) on  03/22/2014 7:55:54 PM      MDM   Final diagnoses:  Altered mental status, unspecified altered mental status type  Generalized weakness  Urinary tract infection without hematuria, site unspecified    The patient appears reasonably stabilized for admission considering the current resources, flow, and capabilities available in the ED at this time, and I doubt any other [facility] requiring further screening and/or treatment in the ED prior to admission.    Babette Relic, MD 04/02/14 [phone]

## 2014-03-22 NOTE — Progress Notes (Signed)
Patient had one episode of emesis. It was brownish yellow. Moderate amount. Patient states that she feels a whole lot better now that she has let this all out. Patient was asked if she needed medication for n/v- she states that she feels like everything is out and that she is ok. Will continue to monitor

## 2014-03-22 NOTE — ED Notes (Signed)
Lab results given to Dr.Bednar.

## 2014-03-22 NOTE — ED Notes (Signed)
Pt to xray

## 2014-03-22 NOTE — H&P (Addendum)
[facility] Admission History and Physical       [name] HGD:[id number] DOB: 1946/04/05 DOA: 03/22/2014  Referring physician:  EDP PCP: [name], MD  Specialists:   Chief Complaint:  Weakness and Falls  HPI: [name] is a 68 y.o. female with a history of HTN, DM2,Overactive Bladder, B12 Deficiency, Depression, and Hydrocephalus S/P VP Shunt placement who was brought to the ED by her psychotherapist  Due to increased weakness and falls over the past 2 weeks and increasing confusion.   She reports a fall today due to her legs giving way . She uses a walker due to her weakness.  She lives alone and has not been able to take care of herself.    She denies any fevers chills or chest pain or syncope.   She was found to have a UTI, and Elevated BUN Cr, and a Lactic Acidosis.     A head CT was preformed and found to be negative for acute pathology and her VP Shunt is intact.   She was placed on IV Rocephin and referred for medical admission.      Review of Systems:  Constitutional: No Weight Loss, No Weight Gain, Night Sweats, Fevers, Chills, Dizziness, +Fatigue, +Generalized Weakness HEENT: No Headaches, Difficulty Swallowing,Tooth/Dental Problems,Sore Throat,  No Sneezing, Rhinitis, Ear Ache, Nasal Congestion, or Post Nasal Drip,  Cardio-vascular:  No Chest pain, Orthopnea, PND, Edema in Lower Extremities, Anasarca, Dizziness, Palpitations  Resp: No Dyspnea, No DOE, No Productive Cough, No Non-Productive Cough, No Hemoptysis, No Wheezing.    GI: No Heartburn, Indigestion, Abdominal Pain, Nausea, Vomiting, Diarrhea, Hematemesis, Hematochezia, Melena, Change in Bowel Habits,  Loss of Appetite  GU: No Dysuria, Change in Color of Urine, No Urgency or Frequency, No Flank pain.  Musculoskeletal: No Joint Pain or Swelling, No Decreased Range of Motion, No Back Pain.  Neurologic: No Syncope, No Seizures, +Muscle Weakness, Paresthesia, Vision Disturbance or Loss, No  Diplopia, No Vertigo, No Difficulty Walking,  Skin: No Rash or Lesions. Psych: No Change in Mood or Affect, No Depression or Anxiety, No Memory loss, +Confusion, or Hallucinations   Past Medical History  Diagnosis Date  . Depression   . Anxiety   . Hypertension   . MVP (mitral valve prolapse)   . Hydrocephalus   . Diabetes mellitus without complication   . Sinus bradycardia   . Dizziness   . Falls   . Acute kidney injury   . Transaminitis   . Hypotension   . Acid reflux disease      Past Surgical History  Procedure Laterality Date  . Cholecystectomy    . Brain surgery    . Ercp N/A 02/28/2014    Procedure: ENDOSCOPIC RETROGRADE CHOLANGIOPANCREATOGRAPHY (ERCP);  Surgeon: Gatha Mayer, MD;  Location: [facility] ENDOSCOPY;  Service: Endoscopy;  Laterality: N/A;  talked to tiffany from or /ebp  . Esophagogastroduodenoscopy N/A 02/28/2014    Procedure: ESOPHAGOGASTRODUODENOSCOPY (EGD);  Surgeon: Gatha Mayer, MD;  Location: [facility] ENDOSCOPY;  Service: Endoscopy;  Laterality: N/A;  . Ventriculoperitoneal shunt      right occipital      Prior to Admission medications   Medication Sig Start Date End Date Taking? Authorizing Provider  gabapentin (NEURONTIN) 300 MG capsule Take 300 mg by mouth 2 (two) times daily as needed (pain).   Yes Historical Provider, MD  HYDROcodone-acetaminophen (NORCO/VICODIN) 5-325 MG per tablet Take 1-2 tablets by mouth every 4 (four) hours as needed for moderate pain or severe pain. 03/05/14  Yes Harvie Heck, PA-C  ibuprofen (ADVIL,MOTRIN) 200 MG tablet Take 400 mg by mouth every 6 (six) hours as needed for headache.   Yes Historical Provider, MD  LORazepam (ATIVAN) 1 MG tablet Take 1 mg by mouth every 6 (six) hours as needed for anxiety. Take up to 4 times qd   Yes Historical Provider, MD  losartan (COZAAR) 100 MG tablet Take 100 mg by mouth daily.   Yes Historical Provider, MD  OLANZapine-FLUoxetine (SYMBYAX) 6-25 MG per capsule Take 1 capsule by mouth at  bedtime.   Yes Historical Provider, MD  solifenacin (VESICARE) 10 MG tablet Take 10 mg by mouth daily.   Yes Historical Provider, MD  Vortioxetine HBr (BRINTELLIX) 10 MG TABS Take 10 mg by mouth every morning.    Yes Historical Provider, MD    Allergies  Allergen Reactions  . Metformin And Related     diarrhea  . Ace Inhibitors Other (See Comments)    unknown  . Amitriptyline Hcl Other (See Comments)    unknown  . Erythromycin Hives  . Penicillins Hives    Social History:  reports that she has never smoked. She has never used smokeless tobacco. She reports that she does not drink alcohol or use illicit drugs.     Family History  Problem Relation Age of Onset  . Hypertension Mother   . Heart disease Mother   . Stroke Mother   . Alcohol abuse Father   . Diabetes Father   . Cancer Neg Hx        Physical Exam:  GEN:   Pleasant Obese 68 y.o. Caucasian female examined  and in no acute distress; cooperative with exam Filed Vitals:   03/22/14 1900 03/22/14 1901 03/22/14 1930 03/22/14 2000  BP: 148/57  140/57 117/48  Pulse:   77   Resp:  18 18 12   SpO2: 98%  100% 100%   Blood pressure 117/48, pulse 77, resp. rate 12, SpO2 100.00%. PSYCH: She is alert and oriented x4; does not appear anxious does not appear depressed; affect is normal HEENT: Normocephalic and Atraumatic, Mucous membranes pink; PERRLA; EOM intact; Fundi:  Benign;  No scleral icterus, Nares: Patent, Oropharynx: Clear, Fair Dentition,    Neck:  FROM, No Cervical Lymphadenopathy nor Thyromegaly or Carotid Bruit; No JVD; Breasts:: Not examined CHEST WALL: No tenderness CHEST: Normal respiration, clear to auscultation bilaterally HEART: Regular rate and rhythm; no murmurs rubs or gallops BACK: No kyphosis or scoliosis; No CVA tenderness ABDOMEN: Positive Bowel Sounds,  Obese, Soft Non-Tender; No Masses, No Organomegaly. Rectal Exam: Not done EXTREMITIES: No Cyanosis, Clubbing, or Edema; No Ulcerations. Genitalia:  not examined PULSES: 2+ and symmetric SKIN: Normal hydration no rash or ulceration CNS:  Alert and Oriented X 4,  Generalized Weakness but Focal Weakness Vascular: pulses palpable throughout    Labs on Admission:  Basic Metabolic Panel:  Recent Labs Lab 03/22/14 1612  NA 140  K 3.9  CL 102  CO2 25  GLUCOSE 130*  BUN 22  CREATININE 2.10*  CALCIUM 9.2   Liver Function Tests:  Recent Labs Lab 03/22/14 1612  AST 107*  ALT 72*  ALKPHOS 136*  BILITOT 1.7*  PROT 6.6  ALBUMIN 3.0*   No results found for this basename: LIPASE, AMYLASE,  in the last 168 hours No results found for this basename: AMMONIA,  in the last 168 hours CBC:  Recent Labs Lab 03/22/14 1612  WBC 6.5  NEUTROABS 5.1  HGB 12.7  HCT 37.8  MCV  95.5  PLT 212   Cardiac Enzymes: No results found for this basename: CKTOTAL, CKMB, CKMBINDEX, TROPONINI,  in the last 168 hours  BNP (last 3 results) No results found for this basename: PROBNP,  in the last 8760 hours CBG:  Recent Labs Lab 03/22/14 1816  GLUCAP 116*    Radiological Exams on Admission: Dg Chest 2 View  03/22/2014   CLINICAL DATA:  Weakness.  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 There is mild cardiac enlargement. There is no pleural effusion. Pulmonary vascular congestion without edema noted. No airspace consolidation.  IMPRESSION: 1. Pulmonary vascular congestion.   Electronically Signed   By: Kerby Moors M.D.   On: 03/22/2014 16:57   Ct Head Wo Contrast  03/22/2014   CLINICAL DATA:  Altered mental status  EXAM: CT HEAD WITHOUT CONTRAST  TECHNIQUE: Contiguous axial images were obtained from the base of the skull through the vertex without intravenous contrast.  COMPARISON:  03/11/2014  FINDINGS: No skull fracture is noted. Left VP shunt catheter is unchanged in position. No intracranial hemorrhage, mass effect or midline shift. Stable cerebral  atrophy. Stable periventricular and subcortical chronic white matter disease. Ventricular size is stable from prior exam. No acute cortical infarction. No mass lesion is noted on this unenhanced scan.  IMPRESSION: No acute intracranial abnormality. Stable atrophy and chronic white matter disease. Stable left VP shunt catheter position.   Electronically Signed   By: Lahoma Crocker M.D.   On: 03/22/2014 18:08        Assessment/Plan:     68 y.o. female with  Principal Problem:   1.   Acute encephalopathy- probably metabolic   Neuro checks   Check TSH, B12, Folate, and RPR   Treat UTI   IVFs for Hydration       Active Problems:   2.  AKI (acute kidney injury)   IVFs   Hold ARB and Ibuprofen Rx   Monitor BUN/Cr     3.  Generalized weakness   Rx in #1   Physical Therapy Evaluation       4.  UTI (lower urinary tract infection)   Urine C+S sent   IV Rocephin    5.  Lactic acidosis   IVFs   Monitor Renal Function     5.  HYPERTENSION   Monitor BPs   Hold on ARB Rx due TO AKI   IV Hydralazine PRN SBP > 160       6.  Pernicious anemia- History of B12 Deficiency, Not on current supplement   Check B12  level      7.  Elevated Transaminases-   Check  Hepatitis Panel   Monitor LFTS   Check Ammonia Level     8.  DVT Prophylaxis   Lovenox    9.  Other-   Social Work consult for Placement options and or Rehab      Code Status:       Family Communication:     Disposition Plan:         Time spent:  [place] [facility] Pager [phone]   If [place] Please Contact the Day Rounding Team MD for [facility]  If 7PM-7AM, Please Contact night-coverage  [email or web address] Password [facility] 03/22/2014, 8:26 PM

## 2014-03-23 ENCOUNTER — Inpatient Hospital Stay (HOSPITAL_COMMUNITY): Payer: PRIVATE HEALTH INSURANCE

## 2014-03-23 DIAGNOSIS — G9340 Encephalopathy, unspecified: Secondary | ICD-10-CM

## 2014-03-23 DIAGNOSIS — R7989 Other specified abnormal findings of blood chemistry: Secondary | ICD-10-CM

## 2014-03-23 LAB — BASIC METABOLIC PANEL
Anion gap: 16 — ABNORMAL HIGH (ref 5–15)
BUN: 15 mg/dL (ref 6–23)
CO2: 22 mEq/L (ref 19–32)
Calcium: 8.8 mg/dL (ref 8.4–10.5)
Chloride: 108 mEq/L (ref 96–112)
Creatinine, Ser: 1.42 mg/dL — ABNORMAL HIGH (ref 0.50–1.10)
GFR calc Af Amer: 43 mL/min — ABNORMAL LOW (ref >90)
GFR calc non Af Amer: 37 mL/min — ABNORMAL LOW (ref >90)
Glucose, Bld: 139 mg/dL — ABNORMAL HIGH (ref 70–99)
Potassium: 3.9 mEq/L (ref 3.7–5.3)
Sodium: 146 mEq/L (ref 137–147)

## 2014-03-23 LAB — HEPATITIS PANEL, ACUTE
HCV Ab: NEGATIVE
HCV Ab: NEGATIVE
Hep A IgM: NONREACTIVE
Hep A IgM: NONREACTIVE
Hep B C IgM: NONREACTIVE
Hep B C IgM: NONREACTIVE
Hepatitis B Surface Ag: NEGATIVE
Hepatitis B Surface Ag: NEGATIVE

## 2014-03-23 LAB — CBC
HCT: 40.9 % (ref 36.0–46.0)
Hemoglobin: 13.2 g/dL (ref 12.0–15.0)
MCH: 30.8 pg (ref 26.0–34.0)
MCHC: 32.3 g/dL (ref 30.0–36.0)
MCV: 95.6 fL (ref 78.0–100.0)
Platelets: 222 10*3/uL (ref 150–400)
RBC: 4.28 MIL/uL (ref 3.87–5.11)
RDW: 14.4 % (ref 11.5–15.5)
WBC: 6.1 10*3/uL (ref 4.0–10.5)

## 2014-03-23 LAB — HIV ANTIBODY (ROUTINE TESTING W REFLEX): HIV 1&2 Ab, 4th Generation: NONREACTIVE

## 2014-03-23 LAB — VITAMIN B12: Vitamin B-12: 822 pg/mL (ref 211–911)

## 2014-03-23 LAB — SODIUM, URINE, RANDOM: Sodium, Ur: 50 mEq/L

## 2014-03-23 LAB — RPR

## 2014-03-23 LAB — CREATININE, URINE, RANDOM: Creatinine, Urine: 99.2 mg/dL

## 2014-03-23 MED ORDER — PANTOPRAZOLE SODIUM 40 MG PO TBEC
40.0000 mg | DELAYED_RELEASE_TABLET | Freq: Two times a day (BID) | ORAL | Status: DC
  Administered 2014-03-23 – 2014-03-25 (×5): 40 mg via ORAL
  Filled 2014-03-23 (×5): qty 1

## 2014-03-23 NOTE — Progress Notes (Signed)
Nutrition Brief Note  Patient identified on the Malnutrition Screening Tool (MST) Report  Wt Readings from Last 15 Encounters:  03/22/14 237 lb 10.5 oz (107.8 kg)  03/11/14 246 lb (111.585 kg)  03/05/14 246 lb (111.585 kg)  03/01/14 242 lb 11.6 oz (110.1 kg)  03/01/14 242 lb 11.6 oz (110.1 kg)  03/01/14 242 lb 11.6 oz (110.1 kg)  02/14/14 245 lb 13 oz (111.5 kg)  12/27/13 263 lb (119.296 kg)  09/19/13 252 lb 12 oz (114.647 kg)  08/15/13 257 lb (116.574 kg)  06/05/10 252 lb (114.306 kg)  06/02/10 252 lb (114.306 kg)  05/22/10 259 lb 9 oz (117.737 kg)  05/15/08 230 lb (104.327 kg)    Body mass index is 39.55 kg/(m^2). Patient meets criteria for Obesity II based on current BMI.   Current diet order is HH/Carb modified, patient is consuming approximately 50-75% of meals at this time. Labs and medications reviewed.   Pt reported a decreased appetite pta d/t weakness and dehydration; however this is improving during admit and is eating >50% of meals. MD plan for SNF or Home health assistance on d/c to assist pt with needs.  Reported a 5 lb unintentional wt loss over one month period (2% body weight loss, non-severe for time frame)  No nutrition interventions warranted at this time. If nutrition issues arise, please consult RD.   Atlee Abide MS RD LDN Clinical Dietitian [phone]

## 2014-03-23 NOTE — Progress Notes (Signed)
Physical Therapy Evaluation Patient Details Name: [name] MRN: [id number] DOB: Sep 02, 1945 Today's Date: 03/23/2014   History of Present Illness  Admitted with near syncope; was lightheaded, and fell down 3-4 steps at her home; Hypotensive in ER.  Encephalopathy and UTI dx.  Clinical Impression  68 yo female, post fall and recent history of falls.  Acute encephalopathy and UTI dx.  Patient presents with weakness, and elevated fall risk, requiring Minimal assist overall for transfers and ambulation.  Need to assess stairs for return home (Patient declined to try stairs on evaluation).  No losses of balance or legs buckling or giving out on evaluation. Patient will benefit from skilled PT services for strengthening and balance while in hospital, but may return home with Home Health PT recommendation when medically ready.    Follow Up Recommendations Home health PT    Equipment Recommendations  None recommended by PT    Recommendations for Other Services OT consult     Precautions / Restrictions Precautions Precautions: Fall Precaution Comments: Has VP shunt Restrictions Weight Bearing Restrictions: No      Mobility  Bed Mobility Overal bed mobility: Modified Independent             General bed mobility comments: Use of bed rails and HOB elevated  Transfers Overall transfer level: Needs assistance Equipment used: Rolling walker (2 wheeled) Transfers: Sit to/from Stand Sit to Stand: Min guard         General transfer comment: cues for hand placement  Ambulation/Gait Ambulation/Gait assistance: Min guard Ambulation Distance (Feet): 200 Feet Assistive device: Rolling walker (2 wheeled) Gait Pattern/deviations: WFL(Within Functional Limits)   Gait velocity interpretation: at or above normal speed for age/gender General Gait Details: reports fearful of legs giving out  Stairs Stairs:  (Patient declined stairs today)          Wheelchair Mobility     Modified Rankin (Stroke Patients Only)       Balance Overall balance assessment: History of Falls;Needs assistance   Sitting balance-Leahy Scale: Normal       Standing balance-Leahy Scale: Good       Tandem Stance - Right Leg:  (unsteady/unable) Tandem Stance - Left Leg:  (Unsteady/unable)                     Pertinent Vitals/Pain Pain Assessment: 0-10 Pain Score: 5  Pain Location: Head Pain Descriptors / Indicators: Aching ("Migrane forming") Pain Intervention(s): Ice applied;Premedicated before session;Repositioned    Home Living Family/patient expects to be discharged to:: Private residence Living Arrangements: Alone Available Help at Discharge: Available PRN/intermittently Type of Home: House Home Access: Stairs to enter   Technical brewer of Steps: 3 Home Layout: Two level;Bed/bath upstairs Home Equipment: Walker - 2 wheels Additional Comments: Has been using RW recently due to recent falls.    Prior Function Level of Independence: Independent               Hand Dominance        Extremity/Trunk Assessment   Upper Extremity Assessment: Overall WFL for tasks assessed           Lower Extremity Assessment: Generalized weakness;Overall [facility] for tasks assessed      Cervical / Trunk Assessment:  (Patient reports she cannot hold urine)  Communication   Communication: HOH  Cognition Arousal/Alertness: Awake/alert Behavior During Therapy: WFL for tasks assessed/performed Overall Cognitive Status: Within Functional Limits for tasks assessed (Some wordfinding difficulty noted)  General Comments      Exercises        Assessment/Plan    PT Assessment Patient needs continued PT services  PT Diagnosis Generalized weakness   PT Problem List Decreased strength;Decreased balance;Decreased mobility  PT Treatment Interventions Gait training;Stair training;Functional mobility training;Therapeutic  activities;Therapeutic exercise;Balance training   PT Goals (Current goals can be found in the Care Plan section) Acute Rehab PT Goals Patient Stated Goal: To get better, not fall PT Goal Formulation: With patient Time For Goal Achievement: 04/06/14 Potential to Achieve Goals: Good    Frequency Min 3X/week   Barriers to discharge        Co-evaluation               End of Session Equipment Utilized During Treatment: Gait belt Activity Tolerance: Patient tolerated treatment well Patient left: in bed;with call bell/phone within reach Nurse Communication: Mobility status         Time: 1250-1331 PT Time Calculation (min): 41 min   Charges:   PT Evaluation $Initial PT Evaluation Tier I: 1 Procedure PT Treatments $Gait Training: 8-22 mins $Therapeutic Activity: 8-22 mins   PT G CodesZenia Resides, Vianney Kopecky L 14-Apr-2014, 2:13 PM

## 2014-03-23 NOTE — Progress Notes (Signed)
Emesis x2. MD being notified

## 2014-03-23 NOTE — Progress Notes (Signed)
[facility] PROGRESS NOTE Assessment/Plan: Acute encephalopathy multifactorial UTI (lower urinary tract infection) and AKI: - now resolved.  UTI: - started rocephin IV has remained afebrile. - B-met pending. U.C and BC pending. - Bp has remain stable  AKI: - baseline Cr < 1.0 - multifactorial possible due to sepsis along with ACE-I and NSAId's use. - started IV fluid CONT,  b-met pending - check Urine sodium and Cr.  Elevated liver function tests - check hepatitis pane and abd U/S.  Metabolic acidosis due Lactic acidosis - resolved with IV fluids.   Fall - pt consult, due to decrease intravascular vol.  HYPERTENSION - Hold ARB-II    Code Status: full Family Communication: none  Disposition Plan: inpatient   Consultants:  none  Procedures:  CT head  CXR  Antibiotics:  Rocephin  HPI/Subjective: Relates feels better, had dysuria  Objective: Filed Vitals:   03/22/14 2056 03/23/14 0245 03/23/14 0642 03/23/14 0819  BP: 152/78 124/57 134/74 132/81  Pulse: 82 56 90 90  Temp: 98.1 F (36.7 C) 98.2 F (36.8 C) 98.1 F (36.7 C) 98.3 F (36.8 C)  TempSrc: Oral Oral Oral Oral  Resp: _0 Height: _1  (1.651 m)     Weight: 107.8 kg (237 lb 10.5 oz)     SpO2: 97% 93% 93% 92%    Intake/Output Summary (Last 24 hours) at 03/23/14 0829 Last data filed at 03/23/14 0529  Gross per 24 hour  Intake      0 ml  Output    150 ml  Net   -150 ml   Filed Weights   03/22/14 2056  Weight: 107.8 kg (237 lb 10.5 oz)    Exam:  General: Alert, awake, oriented x3, in no acute distress.  HEENT: No bruits, no goiter.  Heart: Regular rate and rhythm. Lungs: Good air movement, clear Abdomen: Soft, suprapubic tendenessr, nondistended, positive bowel sounds.     Data Reviewed: Basic Metabolic Panel:  Recent Labs Lab 03/22/14 1612  NA 140  K 3.9  CL 102  CO2 25  GLUCOSE 130*  BUN 22  CREATININE 2.10*  CALCIUM 9.2   Liver Function  Tests:  Recent Labs Lab 03/22/14 1612  AST 107*  ALT 72*  ALKPHOS 136*  BILITOT 1.7*  PROT 6.6  ALBUMIN 3.0*   No results found for this basename: LIPASE, AMYLASE,  in the last 168 hours No results found for this basename: AMMONIA,  in the last 168 hours CBC:  Recent Labs Lab 03/22/14 1612  WBC 6.5  NEUTROABS 5.1  HGB 12.7  HCT 37.8  MCV 95.5  PLT 212   Cardiac Enzymes: No results found for this basename: CKTOTAL, CKMB, CKMBINDEX, TROPONINI,  in the last 168 hours BNP (last 3 results) No results found for this basename: PROBNP,  in the last 8760 hours CBG:  Recent Labs Lab 03/22/14 1816  GLUCAP 116*    No results found for this or any previous visit (from the past 240 hour(s)).   Studies: Dg Chest 2 View  03/22/2014   CLINICAL DATA:  Weakness.  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 There is mild cardiac enlargement. There is no pleural effusion. Pulmonary vascular congestion without edema noted. No airspace consolidation.  IMPRESSION: 1. Pulmonary vascular congestion.   Electronically Signed   By: Kerby Moors M.D.   On: 03/22/2014 16:57   Ct Head Wo Contrast  03/22/2014   CLINICAL DATA:  Altered mental status  EXAM: CT HEAD WITHOUT CONTRAST  TECHNIQUE: Contiguous axial images were obtained from the base of the skull through the vertex without intravenous contrast.  COMPARISON:  03/11/2014  FINDINGS: No skull fracture is noted. Left VP shunt catheter is unchanged in position. No intracranial hemorrhage, mass effect or midline shift. Stable cerebral atrophy. Stable periventricular and subcortical chronic white matter disease. Ventricular size is stable from prior exam. No acute cortical infarction. No mass lesion is noted on this unenhanced scan.  IMPRESSION: No acute intracranial abnormality. Stable atrophy and chronic white matter disease. Stable left VP shunt catheter position.    Electronically Signed   By: Lahoma Crocker M.D.   On: 03/22/2014 18:08    Scheduled Meds: . antiseptic oral rinse  7 mL Mouth Rinse BID  . cefTRIAXone (ROCEPHIN)  IV  1 g Intravenous Q24H  . darifenacin  15 mg Oral Daily  . enoxaparin (LOVENOX) injection  30 mg Subcutaneous Q24H  . OLANZapine-FLUoxetine  1 capsule Oral QHS  . sodium chloride  500 mL Intravenous Once  . Vortioxetine HBr  10 mg Oral Daily   Continuous Infusions: . sodium chloride 75 mL/hr at 03/22/14 2159     Charlynne Cousins  [facility] Pager [phone]. If 8PM-8AM, please contact night-coverage at [email or web address], password [facility] 03/23/2014, 8:29 AM  LOS: 1 day      **Disclaimer: This note may have been dictated with voice recognition software. Similar sounding words can inadvertently be transcribed and this note may contain transcription errors which may not have been corrected upon publication of note.**

## 2014-03-23 NOTE — Progress Notes (Signed)
PRN zofran to be given

## 2014-03-24 DIAGNOSIS — R7401 Elevation of levels of liver transaminase levels: Secondary | ICD-10-CM

## 2014-03-24 DIAGNOSIS — R740 Nonspecific elevation of levels of transaminase and lactic acid dehydrogenase [LDH]: Secondary | ICD-10-CM

## 2014-03-24 DIAGNOSIS — R7402 Elevation of levels of lactic acid dehydrogenase (LDH): Secondary | ICD-10-CM

## 2014-03-24 DIAGNOSIS — R55 Syncope and collapse: Secondary | ICD-10-CM

## 2014-03-24 LAB — HEPATIC FUNCTION PANEL
ALT: 49 U/L — ABNORMAL HIGH (ref 0–35)
AST: 73 U/L — ABNORMAL HIGH (ref 0–37)
Albumin: 2.8 g/dL — ABNORMAL LOW (ref 3.5–5.2)
Alkaline Phosphatase: 127 U/L — ABNORMAL HIGH (ref 39–117)
Bilirubin, Direct: 0.6 mg/dL — ABNORMAL HIGH (ref 0.0–0.3)
Indirect Bilirubin: 0.5 mg/dL (ref 0.3–0.9)
Total Bilirubin: 1.1 mg/dL (ref 0.3–1.2)
Total Protein: 6.3 g/dL (ref 6.0–8.3)

## 2014-03-24 LAB — BASIC METABOLIC PANEL
Anion gap: 11 (ref 5–15)
BUN: 10 mg/dL (ref 6–23)
CO2: 26 mEq/L (ref 19–32)
Calcium: 9.1 mg/dL (ref 8.4–10.5)
Chloride: 111 mEq/L (ref 96–112)
Creatinine, Ser: 1.08 mg/dL (ref 0.50–1.10)
GFR calc Af Amer: 60 mL/min — ABNORMAL LOW (ref >90)
GFR calc non Af Amer: 52 mL/min — ABNORMAL LOW (ref >90)
Glucose, Bld: 122 mg/dL — ABNORMAL HIGH (ref 70–99)
Potassium: 3.4 mEq/L — ABNORMAL LOW (ref 3.7–5.3)
Sodium: 148 mEq/L — ABNORMAL HIGH (ref 137–147)

## 2014-03-24 LAB — FOLATE RBC: RBC Folate: 1147 ng/mL — ABNORMAL HIGH (ref >280)

## 2014-03-24 MED ORDER — POTASSIUM CHLORIDE CRYS ER 20 MEQ PO TBCR
40.0000 meq | EXTENDED_RELEASE_TABLET | Freq: Two times a day (BID) | ORAL | Status: AC
  Administered 2014-03-24 (×2): 40 meq via ORAL
  Filled 2014-03-24 (×2): qty 2

## 2014-03-24 MED ORDER — ENOXAPARIN SODIUM 60 MG/0.6ML ~~LOC~~ SOLN
50.0000 mg | SUBCUTANEOUS | Status: DC
  Administered 2014-03-25: 50 mg via SUBCUTANEOUS
  Filled 2014-03-24: qty 0.6

## 2014-03-24 NOTE — Progress Notes (Signed)
[facility] PROGRESS NOTE Assessment/Plan: Acute encephalopathy multifactorial UTI (lower urinary tract infection) and AKI: - now resolved.  UTI: - started rocephin IV has remained afebrile. - B-met pending. U.C gram negative rods and BC pending. - Bp has remain stable  AKI: - baseline Cr < 1.0, pre-renal. - multifactorial possible due to sepsis along with ACE-I and NSAId's use. - resolved with hydration.  Elevated liver function tests - negative hepatitis pane and abd U/S no stone. - most likely due to UTI, repeat hepatic function panel.  Metabolic acidosis due Lactic acidosis - resolved with IV fluids.   Fall - pt consult, due to decrease intravascular vol.  HYPERTENSION - Hold ARB-II    Code Status: full Family Communication: none  Disposition Plan: inpatient   Consultants:  none  Procedures:  CT head  CXR Abdominal US 8.22.2015: The echotexture of the liver is increased and heterogeneous. No  discrete mass or ductal dilation are demonstrated.   There is no evidence of acute pancreatitis nor intrapancreatic or common bile duct dilation.  Antibiotics:  Rocephin  HPI/Subjective: No complains  Objective: Filed Vitals:   03/23/14 1611 03/23/14 2000 03/24/14 0037 03/24/14 0415  BP: 109/46 123/63 141/67 154/59  Pulse: 82 80 72 68  Temp: 98.4 F (36.9 C) 98.1 F (36.7 C) 98.1 F (36.7 C) 97.7 F (36.5 C)  TempSrc: Oral Oral Oral Oral  Resp: $Remo'16 16 16 16  'GBFxB$ Height:      Weight:      SpO2: 95% 96% 95% 96%    Intake/Output Summary (Last 24 hours) at 03/24/14 0854 Last data filed at 03/24/14 0500  Gross per 24 hour  Intake 2376.25 ml  Output      0 ml  Net 2376.25 ml   Filed Weights   03/22/14 2056  Weight: 107.8 kg (237 lb 10.5 oz)    Exam:  General: Alert, awake, oriented x3, in no acute distress.  HEENT: No bruits, no goiter.  Heart: Regular rate and rhythm. Lungs: Good air movement, clear Abdomen: Soft, suprapubic tendenessr,  nondistended, positive bowel sounds.     Data Reviewed: Basic Metabolic Panel:  Recent Labs Lab 03/22/14 1612 03/23/14 0805 03/24/14 0544  NA 140 146 148*  K 3.9 3.9 3.4*  CL 102 108 111  CO2 $Re'25 22 26  'IoB$ GLUCOSE 130* 139* 122*  BUN $Re'22 15 10  'FoD$ CREATININE 2.10* 1.42* 1.08  CALCIUM 9.2 8.8 9.1   Liver Function Tests:  Recent Labs Lab 03/22/14 1612  AST 107*  ALT 72*  ALKPHOS 136*  BILITOT 1.7*  PROT 6.6  ALBUMIN 3.0*   No results found for this basename: LIPASE, AMYLASE,  in the last 168 hours No results found for this basename: AMMONIA,  in the last 168 hours CBC:  Recent Labs Lab 03/22/14 1612 03/23/14 0805  WBC 6.5 6.1  NEUTROABS 5.1  --   HGB 12.7 13.2  HCT 37.8 40.9  MCV 95.5 95.6  PLT 212 222   Cardiac Enzymes: No results found for this basename: CKTOTAL, CKMB, CKMBINDEX, TROPONINI,  in the last 168 hours BNP (last 3 results) No results found for this basename: PROBNP,  in the last 8760 hours CBG:  Recent Labs Lab 03/22/14 1816  GLUCAP 116*    Recent Results (from the past 240 hour(s))  CULTURE, BLOOD (ROUTINE X 2)     Status: None   Collection Time    03/22/14  5:15 PM      Result Value Ref Range Status  Specimen Description BLOOD ARM RIGHT   Final   Special Requests BOTTLES DRAWN AEROBIC AND ANAEROBIC 10CC   Final   Culture  Setup Time     Final   Value: 03/22/2014 23:08     Performed at [facility]   Culture     Final   Value:        BLOOD CULTURE RECEIVED NO GROWTH TO DATE CULTURE WILL BE HELD FOR 5 DAYS BEFORE ISSUING A FINAL NEGATIVE REPORT     Performed at [facility]   Report Status PENDING   Incomplete  CULTURE, BLOOD (ROUTINE X 2)     Status: None   Collection Time    03/22/14  5:30 PM      Result Value Ref Range Status   Specimen Description BLOOD HAND RIGHT   Final   Special Requests BOTTLES DRAWN AEROBIC AND ANAEROBIC 10CC   Final   Culture  Setup Time     Final   Value: 03/22/2014 23:07     Performed at  [facility]   Culture     Final   Value:        BLOOD CULTURE RECEIVED NO GROWTH TO DATE CULTURE WILL BE HELD FOR 5 DAYS BEFORE ISSUING A FINAL NEGATIVE REPORT     Performed at [facility]   Report Status PENDING   Incomplete  URINE CULTURE     Status: None   Collection Time    03/22/14  6:00 PM      Result Value Ref Range Status   Specimen Description URINE, CLEAN CATCH   Final   Special Requests NONE   Final   Culture  Setup Time     Final   Value: 03/22/2014 23:41     Performed at [place]     Final   Value: >=100,000 COLONIES/ML     Performed at [facility]   Culture     Final   Value: GRAM NEGATIVE RODS     Performed at [facility]   Report Status PENDING   Incomplete     Studies: Dg Chest 2 View  03/22/2014   CLINICAL DATA:  Weakness.  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 There is mild cardiac enlargement. There is no pleural effusion. Pulmonary vascular congestion without edema noted. No airspace consolidation.  IMPRESSION: 1. Pulmonary vascular congestion.   Electronically Signed   By: Kerby Moors M.D.   On: 03/22/2014 16:57   Ct Head Wo Contrast  03/22/2014   CLINICAL DATA:  Altered mental status  EXAM: CT HEAD WITHOUT CONTRAST  TECHNIQUE: Contiguous axial images were obtained from the base of the skull through the vertex without intravenous contrast.  COMPARISON:  03/11/2014  FINDINGS: No skull fracture is noted. Left VP shunt catheter is unchanged in position. No intracranial hemorrhage, mass effect or midline shift. Stable cerebral atrophy. Stable periventricular and subcortical chronic white matter disease. Ventricular size is stable from prior exam. No acute cortical infarction. No mass lesion is noted on this unenhanced scan.  IMPRESSION: No acute intracranial abnormality. Stable atrophy and chronic white matter disease.  Stable left VP shunt catheter position.   Electronically Signed   By: Lahoma Crocker M.D.   On: 03/22/2014 18:08   US Abdomen Complete  03/23/2014   CLINICAL DATA:  Nausea and vomiting and elevated hepatic function studies.  EXAM: ULTRASOUND ABDOMEN COMPLETE  COMPARISON:  Lumbar spine series of March 11, 2014 and abdominal and pelvic CT scan of February 22, 2014.  FINDINGS: Gallbladder:  Surgically absent  Common bile duct:  Diameter: 4 mm  Liver:  The liver exhibits increased echotexture which is somewhat heterogeneous. There is no focal mass or ductal dilation.  IVC:  Portions are obscured by bowel gas.  Pancreas:  The pancreas exhibits no focal mass nor ductal dilation.  Spleen:  There is mild prominence of the spleen with overall volume of 419 cc and maximal dimension of 13.5 cm. The spleen is been noted to be prominent in the past.  Right Kidney:  Length: 12.4 cm. Echogenicity within normal limits. No mass or hydronephrosis visualized.  Left Kidney:  Length: 13.2 cm. Echogenicity within normal limits. No mass or hydronephrosis visualized.  Abdominal aorta:  The lower aorta and bifurcation or not demonstrated. No aneurysm is visible.  Other findings:  None.  IMPRESSION: 1. The echotexture of the liver is increased and heterogeneous. No discrete mass or ductal dilation are demonstrated. 2. There is no evidence of acute pancreatitis nor intrapancreatic or common bile duct dilation. 3. There is mild prominence of the spleen which is stable.   Electronically Signed   By: David  Martinique   On: 03/23/2014 11:05    Scheduled Meds: . antiseptic oral rinse  7 mL Mouth Rinse BID  . cefTRIAXone (ROCEPHIN)  IV  1 g Intravenous Q24H  . darifenacin  15 mg Oral Daily  . enoxaparin (LOVENOX) injection  30 mg Subcutaneous Q24H  . OLANZapine-FLUoxetine  1 capsule Oral QHS  . pantoprazole  40 mg Oral BID  . sodium chloride  500 mL Intravenous Once  . Vortioxetine HBr  10 mg Oral Daily   Continuous Infusions: . sodium  chloride 75 mL/hr at 03/24/14 [place], Edana Aguado  [facility] Pager [phone]. If 8PM-8AM, please contact night-coverage at [email or web address], password [facility] 03/24/2014, 8:54 AM  LOS: 2 days      **Disclaimer: This note may have been dictated with voice recognition software. Similar sounding words can inadvertently be transcribed and this note may contain transcription errors which may not have been corrected upon publication of note.**

## 2014-03-24 NOTE — Progress Notes (Addendum)
CSW received call from Emergency Room MD requesting that CSW speak with patient's Psychotherapist who brought patient to the hospital after finding her at home with squalor and a neglectful situation.  CSW spoke via telephone to [place] : [phone]  ([phone].  Per Ms. Roderic Scarce, she has been patient's therapist for many years but felt that patient is now in a self-neglectful situation. She relates that her home is filthy, the furniture and floors are soiled, patient cannot bathe or care for herself.  She found patient on her sofa and found that she was incontinent and unable to get up from the sofa.  Patient has a long history of visits to the hospital per Ms. Cornelius but consistently refused SNF placement. She is very fearful of loosing her home; and does not have any family to help her.  CSW discussed patient with ED physician who stated that he plans to admit patient for further work up. Will asked weekend CSW to follow up if possible with patient.  CSW requested that Ms. Cornelius contact [facility] to provide a referral as she has actual first hand knowledge of patient's self -neglect.  She stated that the patient has told her that she would "never forgive her" if she tried to make her go to a nursing home.  CSW stated that we are mandated as self-reporters is we feel a patient is in a dangerous or neglectful situation.  Will need CSW follow up to determine if she will agree to at least short term placement. Patient has had multiple falls at home as well per Ms. Cornelius.  Lorie Phenix. Pauline Good, [place]

## 2014-03-25 LAB — BASIC METABOLIC PANEL
Anion gap: 10 (ref 5–15)
BUN: 7 mg/dL (ref 6–23)
CO2: 24 mEq/L (ref 19–32)
Calcium: 8.9 mg/dL (ref 8.4–10.5)
Chloride: 108 mEq/L (ref 96–112)
Creatinine, Ser: 0.88 mg/dL (ref 0.50–1.10)
GFR calc Af Amer: 77 mL/min — ABNORMAL LOW (ref >90)
GFR calc non Af Amer: 66 mL/min — ABNORMAL LOW (ref >90)
Glucose, Bld: 94 mg/dL (ref 70–99)
Potassium: 4.2 mEq/L (ref 3.7–5.3)
Sodium: 142 mEq/L (ref 137–147)

## 2014-03-25 LAB — URINE CULTURE: Colony Count: >=100000

## 2014-03-25 MED ORDER — CIPROFLOXACIN HCL 500 MG PO TABS: 500.0000 mg | ORAL_TABLET | Freq: Two times a day (BID) | ORAL | Status: DC

## 2014-03-25 NOTE — Care Management Note (Signed)
    Page 1 of 2   03/25/2014     11:14:20 AM CARE MANAGEMENT NOTE 03/25/2014  Patient:  [name], [name]   Account Number:  [id number]  Date Initiated:  03/25/2014  Documentation initiated by:  Tomi Bamberger  Subjective/Objective Assessment:   dx ams, chronic weakness, uti  admit- lives alone.     Action/Plan:   pt eval- rec hhpt   Anticipated DC Date:  03/25/2014   Anticipated DC Plan:  [place]  CM consult      [facility] Choice  HOME HEALTH   Choice offered to / List presented to:  [name]        [place] arranged  HH-1 RN  [place].   Status of service:  Completed, signed off Medicare Important Message given?  YES (If response is "NO", the following Medicare IM given date fields will be blank) Date Medicare IM given:  03/25/2014 Medicare IM given by:  Tomi Bamberger Date Additional Medicare IM given:   Additional Medicare IM given by:    Discharge Disposition:  [place]  Per UR Regulation:  Reviewed for med. necessity/level of care/duration of stay  If discussed at [place] of Stay Meetings, dates discussed:    Comments:  03/25/14 [place], BSN [phone] patient lives alone, per pysical therapy eval rec hhpt, patient chose [facility] for HHRN,PT, OT, aide and Education officer, museum. Referral madet to [facility], [place] notified.  Soc will begin 24-48 hrs post dc.

## 2014-03-25 NOTE — Progress Notes (Signed)
PT Cancellation Note  Patient Details Name: [name] MRN: [id number] DOB: [birth year]   Cancelled Treatment:    Reason Eval/Treat Not Completed: Patient declined, no reason specified Pt declined participating in therapy today due to feeling nauseous. Will follow up in PM if time allows or on 8/25.   Candy Sledge A 03/25/2014, 11:30 AM Candy Sledge, PT, DPT [phone]

## 2014-03-25 NOTE — Progress Notes (Signed)
Patient discharge teaching given, including activity, diet, follow-up appoints, and medications. Patient verbalized understanding of all discharge instructions. IV access was d/c'd. Vitals are stable. Skin is intact except as charted in most recent assessments. Pt to be escorted out by NT, to be driven home by family. 

## 2014-03-25 NOTE — Discharge Summary (Signed)
Physician Discharge Summary  [name] KXF:[id number] DOB: 1945/12/21 DOA: 03/22/2014  PCP: [name], MD  Admit date: 03/22/2014 Discharge date: 03/25/2014  Time spent: 40 minutes  Recommendations for Outpatient Follow-up:  1. Patient was positive on toxicology screen for amphetamines for unknown reasons 2. Hemoglobin A1c is 6.6, not currently on diabetic medication  3. Please follow LFTs 4. Patient may need some type of placement, but politely refuses it this time 5. Being discharged with full home services   Discharge Diagnoses:  Principal Problem:   UTI (lower urinary tract infection) Active Problems:   HYPERTENSION   AKI (acute kidney injury)   Fall   Elevated liver function tests   Acute encephalopathy   Generalized weakness   Lactic acidosis   Pernicious anemia   Discharge Condition: stable  Diet recommendation: Carb modified  Filed Weights   03/22/14 2056  Weight: 107.8 kg (237 lb 10.5 oz)    History of present illness:  [name] is a 68 y.o. female with a history of HTN, DM2,Overactive Bladder, B12 Deficiency, Depression, and Hydrocephalus S/P VP Shunt placement who was brought to the ED by her psychotherapist Due to increased weakness and falls over the past 2 weeks and increasing confusion. She reports a fall today due to her legs giving way . She uses a walker due to her weakness. She lives alone and has not been able to take care of herself. She denies any fevers chills or chest pain or syncope. She was found to have a UTI, and Elevated BUN Cr, and a Lactic Acidosis. A head CT was preformed and found to be negative for acute pathology and her VP Shunt is intact. She was placed on IV Rocephin and referred for medical admission.    Hospital Course:   Acute encephalopathy multifactorial UTI (lower urinary tract infection) and AKI:  - now resolved.  -treated inpatient with Rocephin and will be discharged on Ciprofloxacin x 4 additional days to  complete 7 days of therapy -culture shows Citrobacter Freundii   UTI:  - see above  AKI:  -resolved  -baseline Cr < 1.0, pre-renal.  - multifactorial possible due to sepsis along with ACE-I and NSAID use and UTI  Elevated liver function tests  - negative hepatitis panel and abd U/S no stone - most likely due to infection -Please follow LFTs outpatient  Diabetes with neuropathy -A1c 6.6 -diet controlled, stable for outpatient follow up - Metabolic acidosis due Lactic acidosis  - resolved with IV fluids   Fall  -could be secondary to infectious process -PT recommended home health physical therapy  Hypertension -ARBs were held during hospital stay, but restarted on discharge    Procedures: None  Consultations:  None   Discharge Exam: Filed Vitals:   03/25/14 0417  BP: 156/70  Pulse: 73  Temp: 98.4 F (36.9 C)  Resp: 14    Gen:  Alert and oriented, in no acute distress Chest:  Clear to auscultate bilaterally, no ronchi or rales Cardiac:  Regular rate and rhythm, S1-S2, no rubs murmurs or gallops Abdomen:  Soft, non tender, non distended, + bowel sounds. No guarding or rigidity Extremities:  Symmetrical in appearance without cyanosis, clubbing, effusion, or edema Abdomen: soft, non tender, non distended, +bowel sounds. No guarding or rigidity  Extremities: Symmetrical in appearance without cyanosis, clubbing or effusion, or edema   Discharge Instructions       Discharge Instructions   Diet - low sodium heart healthy    Complete by:  As directed      Increase activity slowly    Complete by:  As directed             Medication List         BRINTELLIX 10 MG Tabs  Generic drug:  Vortioxetine HBr  Take 10 mg by mouth every morning.     ciprofloxacin 500 MG tablet  Commonly known as:  CIPRO  Take 1 tablet (500 mg total) by mouth 2 (two) times daily.     gabapentin 300 MG capsule  Commonly known as:  NEURONTIN  Take 300 mg by mouth 2 (two) times  daily as needed (pain).     HYDROcodone-acetaminophen 5-325 MG per tablet  Commonly known as:  NORCO/VICODIN  Take 1-2 tablets by mouth every 4 (four) hours as needed for moderate pain or severe pain.     ibuprofen 200 MG tablet  Commonly known as:  ADVIL,MOTRIN  Take 400 mg by mouth every 6 (six) hours as needed for headache.     LORazepam 1 MG tablet  Commonly known as:  ATIVAN  Take 1 mg by mouth every 6 (six) hours as needed for anxiety. Take up to 4 times qd     losartan 100 MG tablet  Commonly known as:  COZAAR  Take 100 mg by mouth daily.     OLANZapine-FLUoxetine 6-25 MG per capsule  Commonly known as:  SYMBYAX  Take 1 capsule by mouth at bedtime.     solifenacin 10 MG tablet  Commonly known as:  VESICARE  Take 10 mg by mouth daily.       Allergies  Allergen Reactions  . Metformin And Related     diarrhea  . Ace Inhibitors Other (See Comments)    unknown  . Amitriptyline Hcl Other (See Comments)    unknown  . Erythromycin Hives  . Penicillins Hives   Follow-up Information   Follow up with [name], MD In 1 week.   Specialty:  Internal Medicine   Contact information:   [address] [address] [phone]        The results of significant diagnostics from this hospitalization (including imaging, microbiology, ancillary and laboratory) are listed below for reference.    Significant Diagnostic Studies: Dg Chest 2 View  03/22/2014   CLINICAL DATA:  Weakness.  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 There is mild cardiac enlargement. There is no pleural effusion. Pulmonary vascular congestion without edema noted. No airspace consolidation.  IMPRESSION: 1. Pulmonary vascular congestion.   Electronically Signed   By: Kerby Moors M.D.   On: 03/22/2014 16:57   Dg Lumbar Spine 2-3 Views  03/11/2014   CLINICAL DATA:  Status post fall.  Lower  back pain.  EXAM: LUMBAR SPINE - 2-3 VIEW  COMPARISON:  Lumbar spine radiographs performed 03/06/2014  FINDINGS: There is no evidence of acute fracture or subluxation. There is approximately 7 mm of stable grade 1 anterolisthesis of L4 on L5, reflecting underlying facet disease. Vertebral bodies demonstrate normal height. Intervertebral disc spaces are preserved. The visualized neural foramina are grossly unremarkable in appearance.  The visualized bowel gas pattern is unremarkable in appearance; air and stool are noted within the colon. The sacroiliac joints are within normal limits. Clips are noted within the right upper quadrant, reflecting prior cholecystectomy. A ventriculoperitoneal shunt is seen ending at the left hemipelvis.  IMPRESSION: 1.  No evidence of acute fracture or subluxation along the lumbar spine. 2. Stable grade 1 anterolisthesis of L4 on L5, reflecting underlying facet disease.   Electronically Signed   By: Garald Balding M.D.   On: 03/11/2014 02:42   Dg Lumbar Spine Complete  03/06/2014   CLINICAL DATA:  Pain post trauma  EXAM: LUMBAR SPINE - COMPLETE 4+ VIEW  COMPARISON:  Lumbar MRI August 23, 2013 and CT abdomen and pelvis with bony reformats February 22, 2014  FINDINGS: Frontal, lateral, spot lumbosacral lateral, and bilateral oblique views were obtained. There is a shunt present with the shunt catheter tip in the lower left pelvis. There are 5 non-rib-bearing lumbar type vertebral bodies. There is slight anterior wedging of the T12 vertebral body which is stable compared to the recent CT examination but was not appreciable on the prior MR examination. There is no new fracture. There is 7 mm of anterolisthesis of L4 on L5 which is stable compared to prior studies. There is no new spondylolisthesis. There is disc space narrowing at T12-L1 and L1-2. There is milder disc space narrowing at L2-3, essentially stable. There is facet osteoarthritic change at L4-5 and L5-S1 bilaterally.  IMPRESSION:  Mild anterior wedging of the T12 vertebral body, stable compared to recent CT but not present on prior MR. No new fracture. Stable grade I/ IV anterolisthesis of L4 on L5. No new spondylolisthesis. Areas of osteoarthritic change, stable. 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   Electronically Signed   By: Lowella Grip M.D.   On: 03/06/2014 10:09   Dg Hip Bilateral W/pelvis  03/06/2014   CLINICAL DATA:  Fall, hip pain.  EXAM: BILATERAL HIP WITH PELVIS - 4+ VIEW  COMPARISON:  CT of the abdomen and pelvis 02/22/2014  FINDINGS: SI joints and hip joints are symmetric and unremarkable. No acute bony abnormality. Specifically, no fracture, subluxation, or dislocation. Soft tissues are intact.  The distal aspect of a ventriculoperitoneal shunt is again seen within the pelvis.  IMPRESSION: No acute bony abnormality.   Electronically Signed   By: Rolm Baptise M.D.   On: 03/06/2014 10:06   Ct Head Wo Contrast  03/22/2014   CLINICAL DATA:  Altered mental status  EXAM: CT HEAD WITHOUT CONTRAST  TECHNIQUE: Contiguous axial images were obtained from the base of the skull through the vertex without intravenous contrast.  COMPARISON:  03/11/2014  FINDINGS: No skull fracture is noted. Left VP shunt catheter is unchanged in position. No intracranial hemorrhage, mass effect or midline shift. Stable cerebral atrophy. Stable periventricular and subcortical chronic white matter disease. Ventricular size is stable from prior exam. No acute cortical infarction. No mass lesion is noted on this unenhanced scan.  IMPRESSION: No acute intracranial abnormality. Stable atrophy and chronic white matter disease. Stable left VP shunt catheter position.   Electronically Signed   By: Lahoma Crocker M.D.   On: 03/22/2014 18:08   Ct Head Wo Contrast  03/11/2014   CLINICAL DATA:  AMS, fall  EXAM: CT HEAD WITHOUT CONTRAST  TECHNIQUE: Contiguous axial images were obtained from the base of the skull through the  vertex without intravenous contrast.  COMPARISON:  Prior CT from 02/22/2014  FINDINGS: Left parietal approach VP shunt catheter in place with tip near the septum pellucidum, unchanged. Ventricular size is stable. A single tiny hypodense within the anterior horn of the right lateral ventricle, unchanged, and of doubtful clinical significance. Atrophy with chronic small vessel ischemic changes are stable.  No acute intracranial hemorrhage or large vessel territory infarct. No mass or midline shift. No hydrocephalus. No extra-axial fluid collection.  Calvarium intact.  Scalp soft tissues within normal limits.  No acute abnormality seen about either orbit.  Paranasal sinuses and mastoid air cells are clear.  IMPRESSION: 1. No acute intracranial process. 2. Stable position of left parietal approach VP shunt catheter with tip near the septum pellucidum. Ventricular size is stable relative to most recent CT from 02/22/2014.   Electronically Signed   By: Jeannine Boga M.D.   On: 03/11/2014 02:33   Mr Thoracic Spine Wo Contrast  03/05/2014   CLINICAL DATA:  Bilateral leg numbness.  Urinary incontinence.  EXAM: MRI THORACIC AND LUMBAR SPINE WITHOUT CONTRAST  TECHNIQUE: Multiplanar and multiecho pulse sequences of the thoracic and lumbar spine were obtained without intravenous contrast.  COMPARISON:  Lumbar spine MRI 08/23/2013.  FINDINGS: MR THORACIC SPINE FINDINGS  Vertebral alignment is within normal limits. Vertebral body heights are preserved without evidence of compression fracture. Scattered degenerative marrow changes are noted throughout the thoracic spine, most prominently at T11-12. A hemangioma is noted in the L1 vertebral body. Thoracic spinal cord is normal in caliber and signal. Small left paracentral disc protrusions are present at T6-7, T7-8, and T8-9 without stenosis. At T9-10, mild disc bulging and mild-to-moderate left facet hypertrophy result in mild left neural foraminal stenosis. Mild bilateral  neural foraminal stenosis is present at T10-11 due to facet hypertrophy. Paraspinal soft tissues are unremarkable.  MR LUMBAR SPINE FINDINGS  Grade 1 anterolisthesis of L4 on L5 is unchanged. There is no lumbar compression fracture. Prominent dorsal epidural fat is again seen throughout the lumbar spine. There is also mild, diffuse narrowing of the lumbar spinal canal due to congenitally short pedicles. Mild multilevel disc desiccation is again seen, most prominently at L3-4 and L4-5. L1 vertebral body hemangioma is noted. Conus medullaris is normal in signal and terminates at L1. 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 T2 hyperintense lesion in the lower pole the right kidney is unchanged and most likely represents a cyst.  L1-2:  Minimal disc bulge without stenosis.  L2-3: Minimal disc bulging, congenitally short pedicles, prominent dorsal epidural fat, and mild-to-moderate facet and ligamentum flavum hypertrophy without stenosis.  L3-4: Minimal disc bulging, congenitally short pedicles, prominent dorsal epidural fat, and moderate facet and ligamentum flavum hypertrophy result in mild narrowing of the thecal sac, unchanged. No neural foraminal stenosis.  L4-5: Listhesis with uncovering of the disc, congenitally short pedicles, and severe facet hypertrophy result in severe spinal stenosis and minimal right neural foraminal narrowing, not significantly changed.  L5-S1: Moderate to severe facet arthrosis without significant spinal canal or neural foraminal stenosis, unchanged.  IMPRESSION: MR THORACIC SPINE IMPRESSION  1. Mild thoracic spondylosis without spinal stenosis or cord compression. 2. Mild neural foraminal narrowing on the left at T9-10 and bilaterally at T10-11 due to facet arthrosis.  MR LUMBAR SPINE IMPRESSION  Unchanged appearance of lumbar disc degeneration and facet arthrosis, most pronounced at L4-5 where there is severe spinal stenosis.   Electronically Signed   By: Logan Bores   On: 03/05/2014 21:09    Mr Lumbar Spine Wo Contrast  03/05/2014   CLINICAL DATA:  Bilateral leg numbness.  Urinary incontinence.  EXAM: MRI THORACIC AND LUMBAR SPINE WITHOUT CONTRAST  TECHNIQUE: Multiplanar and multiecho pulse sequences of the thoracic and lumbar spine were obtained without intravenous contrast.  COMPARISON:  Lumbar spine MRI 08/23/2013.  FINDINGS: MR THORACIC SPINE FINDINGS  Vertebral alignment is within normal limits. Vertebral body heights are preserved without evidence of compression fracture. Scattered degenerative marrow changes are noted throughout the thoracic spine, most prominently at T11-12. A hemangioma is noted in the L1 vertebral body. Thoracic spinal cord is normal in caliber and signal. Small left paracentral disc protrusions are present at T6-7, T7-8, and T8-9 without stenosis. At T9-10, mild disc bulging and mild-to-moderate left facet hypertrophy result in mild left neural foraminal stenosis. Mild bilateral neural foraminal stenosis is present at T10-11 due to facet hypertrophy. Paraspinal soft tissues are unremarkable.  MR LUMBAR SPINE FINDINGS  Grade 1 anterolisthesis of L4 on L5 is unchanged. There is no lumbar compression fracture. Prominent dorsal epidural fat is again seen throughout the lumbar spine. There is also mild, diffuse narrowing of the lumbar spinal canal due to congenitally short pedicles. Mild multilevel disc desiccation is again seen, most prominently at L3-4 and L4-5. L1 vertebral body hemangioma is noted. Conus medullaris is normal in signal and terminates at L1. 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 T2 hyperintense lesion in the lower pole the right kidney is unchanged and most likely represents a cyst.  L1-2:  Minimal disc bulge without stenosis.  L2-3: Minimal disc bulging, congenitally short pedicles, prominent dorsal epidural fat, and mild-to-moderate facet and ligamentum flavum hypertrophy without stenosis.  L3-4: Minimal disc bulging, congenitally short pedicles, prominent  dorsal epidural fat, and moderate facet and ligamentum flavum hypertrophy result in mild narrowing of the thecal sac, unchanged. No neural foraminal stenosis.  L4-5: Listhesis with uncovering of the disc, congenitally short pedicles, and severe facet hypertrophy result in severe spinal stenosis and minimal right neural foraminal narrowing, not significantly changed.  L5-S1: Moderate to severe facet arthrosis without significant spinal canal or neural foraminal stenosis, unchanged.  IMPRESSION: MR THORACIC SPINE IMPRESSION  1. Mild thoracic spondylosis without spinal stenosis or cord compression. 2. Mild neural foraminal narrowing on the left at T9-10 and bilaterally at T10-11 due to facet arthrosis.  MR LUMBAR SPINE IMPRESSION  Unchanged appearance of lumbar disc degeneration and facet arthrosis, most pronounced at L4-5 where there is severe spinal stenosis.   Electronically Signed   By: Logan Bores   On: 03/05/2014 21:09   US Abdomen Complete  03/23/2014   CLINICAL DATA:  Nausea and vomiting and elevated hepatic function studies.  EXAM: ULTRASOUND ABDOMEN COMPLETE  COMPARISON:  Lumbar spine series of March 11, 2014 and abdominal and pelvic CT scan of February 22, 2014.  FINDINGS: Gallbladder:  Surgically absent  Common bile duct:  Diameter: 4 mm  Liver:  The liver exhibits increased echotexture which is somewhat heterogeneous. There is no focal mass or ductal dilation.  IVC:  Portions are obscured by bowel gas.  Pancreas:  The pancreas exhibits no focal mass nor ductal dilation.  Spleen:  There is mild prominence of the spleen with overall volume of 419 cc and maximal dimension of 13.5 cm. The spleen is been noted to be prominent in the past.  Right Kidney:  Length: 12.4 cm. Echogenicity within normal limits. No mass or hydronephrosis visualized.  Left Kidney:  Length: 13.2 cm. Echogenicity within normal limits. No mass or hydronephrosis visualized.  Abdominal aorta:  The lower aorta and bifurcation or not  demonstrated. No aneurysm is visible.  Other findings:  None.  IMPRESSION: 1. The echotexture of the liver is increased and heterogeneous. No discrete mass or ductal dilation are demonstrated. 2. There is no evidence of acute pancreatitis nor intrapancreatic or common bile  duct dilation. 3. There is mild prominence of the spleen which is stable.   Electronically Signed   By: Day Greb  [place]   On: 03/23/2014 11:05   Mr 3d Recon At Scanner  02/25/2014   CLINICAL DATA:  Nausea and vomiting. Prior cholecystectomy in 2005. Prior pancreatitis.  EXAM: MRI ABDOMEN WITHOUT AND WITH CONTRAST (INCLUDING MRCP)  TECHNIQUE: Multiplanar multisequence MR imaging of the abdomen was performed both before and after the administration of intravenous contrast. Heavily T2-weighted images of the biliary and pancreatic ducts were obtained, and three-dimensional MRCP images were rendered by post processing.  CONTRAST:  76mL MULTIHANCE GADOBENATE DIMEGLUMINE 529 MG/ML IV SOLN  COMPARISON:  02/22/2014  FINDINGS: Trace left pleural effusion. Common bile duct dilatation to 11 mm with a 6 mm filling defect in the distal CBD and potentially and adjacent flat 0.5 x 0.2 cm filling defect also in the distal CBD.  Accessory left hepatic duct noted primarily serving the caudate lobe.  3 cm periampullary duodenal diverticulum. 1.6 cm cyst, right kidney lower pole. Spleen unremarkable.  Clustered borderline sized porta hepatis lymph nodes. 6.5 x 3.4 cm supraumbilical ventral hernia contains omental adipose tissue. Trace free fluid along the gallbladder fossa.  No specific pancreatic abnormality identified.  IMPRESSION: 1. 6 mm filling defect in the distal common bile duct associated with mild CBD dilatation. Appearance favors distal CBD stone. I do not see definite enhancement to suggest that this is a mass. There is an adjacent duodenal diverticulum. 2. The caudate lobe bile duct branch attach is to the common hepatic duct below the level of the  confluence of the right and left hepatic ducts. 3. Ventral hernia contains omental adipose tissue. 4. Trace bilateral pleural effusions with bibasilar mild atelectasis. Trace edema tracks along the porta hepatis, etiology uncertain.   Electronically Signed   By: Sherryl Barters M.D.   On: 02/25/2014 16:18   Dg Knee Complete 4 Views Left  03/05/2014   CLINICAL DATA:  Fall, knee numbness, predominately laterally.  EXAM: LEFT KNEE - COMPLETE 4+ VIEW  COMPARISON:  None.  FINDINGS: No acute fracture deformity or dislocation. Tricompartmental osteoarthrosis, severe within the patellofemoral, moderate to severe within the medial lateral compartments. No destructive bony lesions. Loose body versus irregular fabella within the popliteal fossa. Soft tissue planes are nonsuspicious.  IMPRESSION: No acute fracture deformity nor dislocation.  Tricompartmental osteoarthrosis, severe within the patellofemoral compartment.   Electronically Signed   By: Elon Alas   On: 03/05/2014 18:46   Dg Ercp Biliary & Pancreatic Ducts  02/28/2014   CLINICAL DATA:  Retained common bile duct stones  EXAM: ERCP  TECHNIQUE: Multiple spot images obtained with the fluoroscopic device and submitted for interpretation post-procedure.  COMPARISON:  02/25/2014  FINDINGS: Multiple spot films were not obtained during an ERCP. 1 min and 37 seconds of fluoroscopy was utilized. Injection was performed and reveals opacification of the common bile duct. By history a stone was removed without difficulty. Followup imaging shows no definitive retained stone.  : These images were submitted for radiologic interpretation only. Please see the procedural report for the amount of contrast and the fluoroscopy time utilized.   Electronically Signed   By: Inez Catalina M.D.   On: 02/28/2014 10:56   Mr Abd W/wo Cm/mrcp  02/25/2014   CLINICAL DATA:  Nausea and vomiting. Prior cholecystectomy in 2005. Prior pancreatitis.  EXAM: MRI ABDOMEN WITHOUT AND WITH  CONTRAST (INCLUDING MRCP)  TECHNIQUE: Multiplanar multisequence MR imaging of the abdomen was performed  both before and after the administration of intravenous contrast. Heavily T2-weighted images of the biliary and pancreatic ducts were obtained, and three-dimensional MRCP images were rendered by post processing.  CONTRAST:  50mL MULTIHANCE GADOBENATE DIMEGLUMINE 529 MG/ML IV SOLN  COMPARISON:  02/22/2014  FINDINGS: Trace left pleural effusion. Common bile duct dilatation to 11 mm with a 6 mm filling defect in the distal CBD and potentially and adjacent flat 0.5 x 0.2 cm filling defect also in the distal CBD.  Accessory left hepatic duct noted primarily serving the caudate lobe.  3 cm periampullary duodenal diverticulum. 1.6 cm cyst, right kidney lower pole. Spleen unremarkable.  Clustered borderline sized porta hepatis lymph nodes. 6.5 x 3.4 cm supraumbilical ventral hernia contains omental adipose tissue. Trace free fluid along the gallbladder fossa.  No specific pancreatic abnormality identified.  IMPRESSION: 1. 6 mm filling defect in the distal common bile duct associated with mild CBD dilatation. Appearance favors distal CBD stone. I do not see definite enhancement to suggest that this is a mass. There is an adjacent duodenal diverticulum. 2. The caudate lobe bile duct branch attach is to the common hepatic duct below the level of the confluence of the right and left hepatic ducts. 3. Ventral hernia contains omental adipose tissue. 4. Trace bilateral pleural effusions with bibasilar mild atelectasis. Trace edema tracks along the porta hepatis, etiology uncertain.   Electronically Signed   By: Sherryl Barters M.D.   On: 02/25/2014 16:18    Microbiology: Recent Results (from the past 240 hour(s))  CULTURE, BLOOD (ROUTINE X 2)     Status: None   Collection Time    03/22/14  5:15 PM      Result Value Ref Range Status   Specimen Description BLOOD ARM RIGHT   Final   Special Requests BOTTLES DRAWN AEROBIC  AND ANAEROBIC 10CC   Final   Culture  Setup Time     Final   Value: 03/22/2014 23:08     Performed at [facility]   Culture     Final   Value:        BLOOD CULTURE RECEIVED NO GROWTH TO DATE CULTURE WILL BE HELD FOR 5 DAYS BEFORE ISSUING A FINAL NEGATIVE REPORT     Performed at [facility]   Report Status PENDING   Incomplete  CULTURE, BLOOD (ROUTINE X 2)     Status: None   Collection Time    03/22/14  5:30 PM      Result Value Ref Range Status   Specimen Description BLOOD HAND RIGHT   Final   Special Requests BOTTLES DRAWN AEROBIC AND ANAEROBIC 10CC   Final   Culture  Setup Time     Final   Value: 03/22/2014 23:07     Performed at [facility]   Culture     Final   Value:        BLOOD CULTURE RECEIVED NO GROWTH TO DATE CULTURE WILL BE HELD FOR 5 DAYS BEFORE ISSUING A FINAL NEGATIVE REPORT     Performed at [employer]   Report Status PENDING   Incomplete  URINE CULTURE     Status: None   Collection Time    03/22/14  6:00 PM      Result Value Ref Range Status   Specimen Description URINE, CLEAN CATCH   Final   Special Requests NONE   Final   Culture  Setup Time     Final   Value: 03/22/2014 23:41  Performed at [facility]     Final   Value: >=100,000 COLONIES/ML     Performed at [facility]   Culture     Final   Value: CITROBACTER FREUNDII     Performed at [facility]   Report Status 03/25/2014 FINAL   Final   Organism ID, Bacteria CITROBACTER FREUNDII   Final     Labs: Basic Metabolic Panel:  Recent Labs Lab 03/22/14 1612 03/23/14 0805 03/24/14 0544 03/25/14 0508  NA 140 146 148* 142  K 3.9 3.9 3.4* 4.2  CL 102 108 111 108  CO2 25 22 26 24   GLUCOSE 130* 139* 122* 94  BUN 22 15 10 7   CREATININE 2.10* 1.42* 1.08 0.88  CALCIUM 9.2 8.8 9.1 8.9   Liver Function Tests:  Recent Labs Lab 03/22/14 1612 03/24/14 0544  AST 107* 73*  ALT 72* 49*  ALKPHOS 136* 127*  BILITOT 1.7* 1.1   PROT 6.6 6.3  ALBUMIN 3.0* 2.8*    Recent Labs Lab 03/22/14 1612 03/23/14 0805  WBC 6.5 6.1  NEUTROABS 5.1  --   HGB 12.7 13.2  HCT 37.8 40.9  MCV 95.5 95.6  PLT 212 222    Results for ARUSHI, PARTRIDGE (MRN [id number]) as of 03/25/2014 11:00  Ref. Range 03/11/2014 05:02  Amphetamines Latest Range: NONE DETECTED  POSITIVE (A)  Barbiturates Latest Range: NONE DETECTED  NONE DETECTED  Benzodiazepines Latest Range: NONE DETECTED  NONE DETECTED  Opiates Latest Range: NONE DETECTED  NONE DETECTED  COCAINE Latest Range: NONE DETECTED  NONE DETECTED  Tetrahydrocannabinol Latest Range: NONE DETECTED  NONE DETECTED   Cardiac Enzymes: No results found for this basename: CKTOTAL, CKMB, CKMBINDEX, TROPONINI,  in the last 168 hours BNP: BNP (last 3 results) No results found for this basename: PROBNP,  in the last 8760 hours CBG:  Recent Labs Lab 03/22/14 1816  GLUCAP 116*    Signed:  Silas Flood [phone]  [facility] 03/25/2014, 11:11 AM  Attending  Patient was seen, examined,treatment plan was discussed with the Physician extender. I have directly reviewed the clinical findings, lab, imaging studies and management of this patient in detail. I have made the necessary changes to the above noted documentation, and agree with the documentation, as recorded by the Physician extender.  Orson Eva, DO [phone]

## 2014-03-25 NOTE — Progress Notes (Signed)
Patient evaluated for community based chronic disease management services with [facility] as a benefit of patient's [employer]. Spoke with patient at bedside to explain [place] [facility] services.  Patient has accepted services with written consent.  Patient may benefit from SW assessment at home because she lives alone and indicated that she has no family support.  She attempted to leave the unit anxiously with urine soaked pants.  Staff redirected her and attempted to get her clean dry garments.  RN [facility] Nurse will assess for disease process management at home.  Patient will receive a post discharge transition of care call and will be evaluated for monthly home visits for assessments and disease process education.  Left contact information and [facility] literature at bedside. Made Inpatient Case Manager aware that [facility] following. Of note, [facility] Management services does not replace or interfere with any services that are arranged by inpatient case management or social work.  For additional questions or referrals please contact Corliss Blacker BSN RN [facility] Liaison at [phone].

## 2014-03-25 NOTE — Progress Notes (Signed)
Utilization review completed.  

## 2014-03-27 ENCOUNTER — Telehealth: Payer: Self-pay | Admitting: *Deleted

## 2014-03-27 NOTE — Telephone Encounter (Signed)
Called pt to get TCM appt no answer x's 10 rings...[name]

## 2014-03-27 NOTE — Telephone Encounter (Signed)
Called pt again still no answer x's 10 rings.Kiara KitchenJohny Chess

## 2014-03-28 ENCOUNTER — Telehealth: Payer: Self-pay | Admitting: Internal Medicine

## 2014-03-28 ENCOUNTER — Emergency Department (HOSPITAL_COMMUNITY): Payer: PRIVATE HEALTH INSURANCE

## 2014-03-28 ENCOUNTER — Encounter (HOSPITAL_COMMUNITY): Payer: Self-pay | Admitting: Emergency Medicine

## 2014-03-28 ENCOUNTER — Emergency Department (HOSPITAL_COMMUNITY)
Admission: EM | Admit: 2014-03-28 | Discharge: 2014-03-28 | Disposition: A | Discharge status: Home / Self Care | Payer: PRIVATE HEALTH INSURANCE | Attending: Emergency Medicine | Admitting: Emergency Medicine

## 2014-03-28 ENCOUNTER — Emergency Department (HOSPITAL_COMMUNITY)
Admission: EM | Admit: 2014-03-28 | Discharge: 2014-03-29 | Disposition: A | Discharge status: Home / Self Care | Payer: PRIVATE HEALTH INSURANCE | Attending: Emergency Medicine | Admitting: Emergency Medicine

## 2014-03-28 DIAGNOSIS — R4182 Altered mental status, unspecified: Secondary | ICD-10-CM | POA: Diagnosis present

## 2014-03-28 DIAGNOSIS — F3289 Other specified depressive episodes: Secondary | ICD-10-CM | POA: Insufficient documentation

## 2014-03-28 DIAGNOSIS — R4789 Other speech disturbances: Secondary | ICD-10-CM | POA: Insufficient documentation

## 2014-03-28 DIAGNOSIS — Z79899 Other long term (current) drug therapy: Secondary | ICD-10-CM | POA: Diagnosis not present

## 2014-03-28 DIAGNOSIS — Y9289 Other specified places as the place of occurrence of the external cause: Secondary | ICD-10-CM | POA: Diagnosis not present

## 2014-03-28 DIAGNOSIS — F329 Major depressive disorder, single episode, unspecified: Secondary | ICD-10-CM | POA: Diagnosis not present

## 2014-03-28 DIAGNOSIS — Z9889 Other specified postprocedural states: Secondary | ICD-10-CM | POA: Diagnosis not present

## 2014-03-28 DIAGNOSIS — E119 Type 2 diabetes mellitus without complications: Secondary | ICD-10-CM | POA: Diagnosis not present

## 2014-03-28 DIAGNOSIS — N179 Acute kidney failure, unspecified: Secondary | ICD-10-CM | POA: Insufficient documentation

## 2014-03-28 DIAGNOSIS — R5381 Other malaise: Secondary | ICD-10-CM | POA: Insufficient documentation

## 2014-03-28 DIAGNOSIS — Z792 Long term (current) use of antibiotics: Secondary | ICD-10-CM | POA: Insufficient documentation

## 2014-03-28 DIAGNOSIS — Z87440 Personal history of urinary (tract) infections: Secondary | ICD-10-CM | POA: Insufficient documentation

## 2014-03-28 DIAGNOSIS — I10 Essential (primary) hypertension: Secondary | ICD-10-CM | POA: Insufficient documentation

## 2014-03-28 DIAGNOSIS — Z043 Encounter for examination and observation following other accident: Secondary | ICD-10-CM | POA: Insufficient documentation

## 2014-03-28 DIAGNOSIS — R5383 Other fatigue: Secondary | ICD-10-CM | POA: Diagnosis not present

## 2014-03-28 DIAGNOSIS — W19XXXA Unspecified fall, initial encounter: Secondary | ICD-10-CM

## 2014-03-28 DIAGNOSIS — Z8719 Personal history of other diseases of the digestive system: Secondary | ICD-10-CM | POA: Insufficient documentation

## 2014-03-28 DIAGNOSIS — F411 Generalized anxiety disorder: Secondary | ICD-10-CM | POA: Diagnosis not present

## 2014-03-28 DIAGNOSIS — Y9389 Activity, other specified: Secondary | ICD-10-CM | POA: Insufficient documentation

## 2014-03-28 DIAGNOSIS — R296 Repeated falls: Secondary | ICD-10-CM | POA: Insufficient documentation

## 2014-03-28 DIAGNOSIS — Z880 Allergy status to penicillin: Secondary | ICD-10-CM | POA: Insufficient documentation

## 2014-03-28 DIAGNOSIS — R531 Weakness: Secondary | ICD-10-CM

## 2014-03-28 LAB — COMPREHENSIVE METABOLIC PANEL
ALT: 42 U/L — ABNORMAL HIGH (ref 0–35)
ALT: 60 U/L — ABNORMAL HIGH (ref 0–35)
AST: 90 U/L — ABNORMAL HIGH (ref 0–37)
AST: 147 U/L — ABNORMAL HIGH (ref 0–37)
Albumin: 2.7 g/dL — ABNORMAL LOW (ref 3.5–5.2)
Albumin: 2.9 g/dL — ABNORMAL LOW (ref 3.5–5.2)
Alkaline Phosphatase: 119 U/L — ABNORMAL HIGH (ref 39–117)
Alkaline Phosphatase: 132 U/L — ABNORMAL HIGH (ref 39–117)
Anion gap: 14 (ref 5–15)
Anion gap: 14 (ref 5–15)
BUN: 9 mg/dL (ref 6–23)
BUN: 8 mg/dL (ref 6–23)
CO2: 23 mEq/L (ref 19–32)
CO2: 21 mEq/L (ref 19–32)
Calcium: 8.5 mg/dL (ref 8.4–10.5)
Calcium: 9.1 mg/dL (ref 8.4–10.5)
Chloride: 105 mEq/L (ref 96–112)
Chloride: 104 mEq/L (ref 96–112)
Creatinine, Ser: 0.89 mg/dL (ref 0.50–1.10)
Creatinine, Ser: 0.77 mg/dL (ref 0.50–1.10)
GFR calc Af Amer: 76 mL/min — ABNORMAL LOW (ref >90)
GFR calc Af Amer: >90 mL/min (ref >90)
GFR calc non Af Amer: 66 mL/min — ABNORMAL LOW (ref >90)
GFR calc non Af Amer: 85 mL/min — ABNORMAL LOW (ref >90)
Glucose, Bld: 153 mg/dL — ABNORMAL HIGH (ref 70–99)
Glucose, Bld: 152 mg/dL — ABNORMAL HIGH (ref 70–99)
Potassium: 3.6 mEq/L — ABNORMAL LOW (ref 3.7–5.3)
Potassium: 3.4 mEq/L — ABNORMAL LOW (ref 3.7–5.3)
Sodium: 142 mEq/L (ref 137–147)
Sodium: 139 mEq/L (ref 137–147)
Total Bilirubin: 1 mg/dL (ref 0.3–1.2)
Total Bilirubin: 0.9 mg/dL (ref 0.3–1.2)
Total Protein: 5.9 g/dL — ABNORMAL LOW (ref 6.0–8.3)
Total Protein: 6.6 g/dL (ref 6.0–8.3)

## 2014-03-28 LAB — URINALYSIS, ROUTINE W REFLEX MICROSCOPIC
Bilirubin Urine: NEGATIVE
Glucose, UA: NEGATIVE mg/dL
Hgb urine dipstick: NEGATIVE
Ketones, ur: NEGATIVE mg/dL
Leukocytes, UA: NEGATIVE
Nitrite: NEGATIVE
Protein, ur: NEGATIVE mg/dL
Specific Gravity, Urine: 1.011 (ref 1.005–1.030)
Urobilinogen, UA: 1 mg/dL (ref 0.0–1.0)
pH: 6 (ref 5.0–8.0)

## 2014-03-28 LAB — CBC WITH DIFFERENTIAL/PLATELET
Basophils Absolute: 0 10*3/uL (ref 0.0–0.1)
Basophils Relative: 1 % (ref 0–1)
Eosinophils Absolute: 0.3 10*3/uL (ref 0.0–0.7)
Eosinophils Relative: 5 % (ref 0–5)
HCT: 37.3 % (ref 36.0–46.0)
Hemoglobin: 12.6 g/dL (ref 12.0–15.0)
Lymphocytes Relative: 17 % (ref 12–46)
Lymphs Abs: 0.9 10*3/uL (ref 0.7–4.0)
MCH: 32 pg (ref 26.0–34.0)
MCHC: 33.8 g/dL (ref 30.0–36.0)
MCV: 94.7 fL (ref 78.0–100.0)
Monocytes Absolute: 0.4 10*3/uL (ref 0.1–1.0)
Monocytes Relative: 6 % (ref 3–12)
Neutro Abs: 4 10*3/uL (ref 1.7–7.7)
Neutrophils Relative %: 71 % (ref 43–77)
Platelets: 176 10*3/uL (ref 150–400)
RBC: 3.94 MIL/uL (ref 3.87–5.11)
RDW: 13.9 % (ref 11.5–15.5)
WBC: 5.6 10*3/uL (ref 4.0–10.5)

## 2014-03-28 LAB — DIFFERENTIAL
Basophils Absolute: 0 10*3/uL (ref 0.0–0.1)
Basophils Relative: 1 % (ref 0–1)
Eosinophils Absolute: 0.2 10*3/uL (ref 0.0–0.7)
Eosinophils Relative: 3 % (ref 0–5)
Lymphocytes Relative: 13 % (ref 12–46)
Lymphs Abs: 0.7 10*3/uL (ref 0.7–4.0)
Monocytes Absolute: 0.3 10*3/uL (ref 0.1–1.0)
Monocytes Relative: 6 % (ref 3–12)
Neutro Abs: 3.9 10*3/uL (ref 1.7–7.7)
Neutrophils Relative %: 77 % (ref 43–77)

## 2014-03-28 LAB — CBC
HCT: 36.4 % (ref 36.0–46.0)
Hemoglobin: 12.1 g/dL (ref 12.0–15.0)
MCH: 31.4 pg (ref 26.0–34.0)
MCHC: 33.2 g/dL (ref 30.0–36.0)
MCV: 94.5 fL (ref 78.0–100.0)
Platelets: 157 10*3/uL (ref 150–400)
RBC: 3.85 MIL/uL — ABNORMAL LOW (ref 3.87–5.11)
RDW: 14 % (ref 11.5–15.5)
WBC: 5.1 10*3/uL (ref 4.0–10.5)

## 2014-03-28 LAB — AMMONIA: Ammonia: 43 umol/L (ref 11–60)

## 2014-03-28 LAB — I-STAT VENOUS BLOOD GAS, ED
Acid-base deficit: 1 mmol/L (ref 0.0–2.0)
Bicarbonate: 23.9 mEq/L (ref 20.0–24.0)
O2 Saturation: 53 %
TCO2: 25 mmol/L (ref 0–100)
pCO2, Ven: 38.2 mmHg — ABNORMAL LOW (ref 45.0–50.0)
pH, Ven: 7.404 — ABNORMAL HIGH (ref 7.250–7.300)
pO2, Ven: 28 mmHg — CL (ref 30.0–45.0)

## 2014-03-28 LAB — CULTURE, BLOOD (ROUTINE X 2)
Culture: NO GROWTH
Culture: NO GROWTH

## 2014-03-28 LAB — CBG MONITORING, ED: Glucose-Capillary: 119 mg/dL — ABNORMAL HIGH (ref 70–99)

## 2014-03-28 LAB — I-STAT CHEM 8, ED
BUN: 7 mg/dL (ref 6–23)
Calcium, Ion: 1.09 mmol/L — ABNORMAL LOW (ref 1.13–1.30)
Chloride: 110 mEq/L (ref 96–112)
Creatinine, Ser: 0.9 mg/dL (ref 0.50–1.10)
Glucose, Bld: 158 mg/dL — ABNORMAL HIGH (ref 70–99)
HCT: 37 % (ref 36.0–46.0)
Hemoglobin: 12.6 g/dL (ref 12.0–15.0)
Potassium: 3.5 mEq/L — ABNORMAL LOW (ref 3.7–5.3)
Sodium: 139 mEq/L (ref 137–147)
TCO2: 22 mmol/L (ref 0–100)

## 2014-03-28 LAB — RAPID URINE DRUG SCREEN, HOSP PERFORMED
Amphetamines: NOT DETECTED
Barbiturates: NOT DETECTED
Benzodiazepines: NOT DETECTED
Cocaine: NOT DETECTED
Opiates: POSITIVE — AB
Tetrahydrocannabinol: NOT DETECTED

## 2014-03-28 LAB — ETHANOL
Alcohol, Ethyl (B): <11 mg/dL (ref 0–11)
Alcohol, Ethyl (B): <11 mg/dL (ref 0–11)

## 2014-03-28 LAB — APTT: aPTT: 38 seconds — ABNORMAL HIGH (ref 24–37)

## 2014-03-28 LAB — I-STAT CG4 LACTIC ACID, ED
Lactic Acid, Venous: 2.06 mmol/L (ref 0.5–2.2)
Lactic Acid, Venous: 1.78 mmol/L (ref 0.5–2.2)

## 2014-03-28 LAB — I-STAT TROPONIN, ED: Troponin i, poc: 0 ng/mL (ref 0.00–0.08)

## 2014-03-28 LAB — PROTIME-INR
INR: 1.25 (ref 0.00–1.49)
Prothrombin Time: 15.7 seconds — ABNORMAL HIGH (ref 11.6–15.2)

## 2014-03-28 MED ORDER — DEXTROSE 5 % IV SOLN
1.0000 g | Freq: Once | INTRAVENOUS | Status: AC
  Administered 2014-03-28: 1 g via INTRAVENOUS
  Filled 2014-03-28: qty 10

## 2014-03-28 MED ORDER — ACETAMINOPHEN 325 MG PO TABS
650.0000 mg | ORAL_TABLET | Freq: Once | ORAL | Status: AC
  Administered 2014-03-28: 650 mg via ORAL
  Filled 2014-03-28: qty 2

## 2014-03-28 NOTE — ED Notes (Signed)
Pt to ED via [facility] for evaluation of a fall.  Bystanders called EMS after seeing pt sitting on concrete behind her car, pt reports her left leg "gave out on her" and she sat down on the street, pt denies injury from fall.  Slurred speech noted upon EMS assessment that pt reports has been present for 2 days along with left leg weakness.  Grips weak throughout.  PA at bedside upon pt arrival.  EMS found pt initially hypotensive 98 systolic, IV started and fluids given- pt orthostatic with EMS.  CBG 178.

## 2014-03-28 NOTE — ED Notes (Signed)
Patient transported to CT 

## 2014-03-28 NOTE — Discharge Instructions (Signed)
Please follow up with your primary care physician in 1-2 days. If you do not have one please call the [place] number listed above. Please take your medicines that were prescribed to you from your last hospital stay. Please read all discharge instructions and return precautions.    Fall Prevention and Home Safety Falls cause injuries and can affect all age groups. It is possible to use preventive measures to significantly decrease the likelihood of falls. There are many simple measures which can make your home safer and prevent falls. OUTDOORS  Repair cracks and edges of walkways and driveways.  Remove high doorway thresholds.  Trim shrubbery on the main path into your home.  Have good outside lighting.  Clear walkways of tools, rocks, debris, and clutter.  Check that handrails are not broken and are securely fastened. Both sides of steps should have handrails.  Have leaves, snow, and ice cleared regularly.  Use sand or salt on walkways during winter months.  In the garage, clean up grease or oil spills. BATHROOM  Install night lights.  Install grab bars by the toilet and in the tub and shower.  Use non-skid mats or decals in the tub or shower.  Place a plastic non-slip stool in the shower to sit on, if needed.  Keep floors dry and clean up all water on the floor immediately.  Remove soap buildup in the tub or shower on a regular basis.  Secure bath mats with non-slip, double-sided rug tape.  Remove throw rugs and tripping hazards from the floors. BEDROOMS  Install night lights.  Make sure a bedside light is easy to reach.  Do not use oversized bedding.  Keep a telephone by your bedside.  Have a firm chair with side arms to use for getting dressed.  Remove throw rugs and tripping hazards from the floor. KITCHEN  Keep handles on pots and pans turned toward the center of the stove. Use back burners when possible.  Clean up spills quickly  and allow time for drying.  Avoid walking on wet floors.  Avoid hot utensils and knives.  Position shelves so they are not too high or low.  Place commonly used objects within easy reach.  If necessary, use a sturdy step stool with a grab bar when reaching.  Keep electrical cables out of the way.  Do not use floor polish or wax that makes floors slippery. If you must use wax, use non-skid floor wax.  Remove throw rugs and tripping hazards from the floor. STAIRWAYS  Never leave objects on stairs.  Place handrails on both sides of stairways and use them. Fix any loose handrails. Make sure handrails on both sides of the stairways are as long as the stairs.  Check carpeting to make sure it is firmly attached along stairs. Make repairs to worn or loose carpet promptly.  Avoid placing throw rugs at the top or bottom of stairways, or properly secure the rug with carpet tape to prevent slippage. Get rid of throw rugs, if possible.  Have an electrician put in a light switch at the top and bottom of the stairs. OTHER FALL PREVENTION TIPS  Wear low-heel or rubber-soled shoes that are supportive and fit well. Wear closed toe shoes.  When using a stepladder, make sure it is fully opened and both spreaders are firmly locked. Do not climb a closed stepladder.  Add color or contrast paint or tape to grab bars and handrails in your home. Place contrasting color  strips on first and last steps.  Learn and use mobility aids as needed. Install an electrical emergency response system.  Turn on lights to avoid dark areas. Replace light bulbs that burn out immediately. Get light switches that glow.  Arrange furniture to create clear pathways. Keep furniture in the same place.  Firmly attach carpet with non-skid or double-sided tape.  Eliminate uneven floor surfaces.  Select a carpet pattern that does not visually hide the edge of steps.  Be aware of all pets. OTHER HOME SAFETY TIPS  Set  the water temperature for 120 F (48.8 C).  Keep emergency numbers on or near the telephone.  Keep smoke detectors on every level of the home and near sleeping areas. Document Released: 07/09/2002 Document Revised: 01/18/2012 Document Reviewed: 10/08/2011 [facility] Patient Information 2015 [place], [place]. This information is not intended to replace advice given to you by your health care provider. Make sure you discuss any questions you have with your health care provider.

## 2014-03-28 NOTE — ED Provider Notes (Signed)
Date: 03/28/2014 @ 11:45  Rate: 62  Rhythm: normal sinus rhythm  QRS Axis: normal  Intervals: normal  ST/T Wave abnormalities: early R wave progression, probably LVH  Conduction Disutrbances:none  Narrative Interpretation:   Old EKG Reviewed: unchanged  Medical screening examination/treatment/procedure(s) were conducted as a shared visit with non-physician practitioner(s) and myself.  I personally evaluated the patient during the encounter. Pt presents after fall, she states her L knee gave out (hx of same since age 10). She denies current symptoms, she is well appearing on PE cardiopulm and abdominal exam benign. I do not feel there is acute process today.    EKG Interpretation None        Ernestina Patches, MD 03/28/14 1700

## 2014-03-28 NOTE — Telephone Encounter (Signed)
Diane from [employer] states they have made several attempts to visit pt but pt has not been available.  Diane states, [facility] will not try to see pt again.

## 2014-03-28 NOTE — ED Notes (Signed)
Per EMS, patient was seen in ED today for fall and discharged around 1600. Pt. Reports she began feeling "altered" around 1530. Pt. Presents to ED with dizziness, slurred speech, and is lethargic.

## 2014-03-28 NOTE — Telephone Encounter (Signed)
Noted  

## 2014-03-28 NOTE — ED Provider Notes (Signed)
CSN: [id number]     Arrival date & time 03/28/14  1134 History   First MD Initiated Contact with Patient 03/28/14 1135     Chief Complaint  Patient presents with  . Fall     (Consider location/radiation/quality/duration/timing/severity/associated sxs/prior Treatment) HPI Comments: Patient is a 68 year old female past medical history significant for depression, anxiety, hypertension, DM, sinus bradycardia, recurrent falls presented to the emergency department for a fall prior to arrival. Patient states her left leg gave out causing her to fall. Patient states she landed on her butt, does not believe she hit her head or passed out. Per EMS, bystanders saw her sitting on concrete behind her car, she was alert. Patient noted to have slurred speech by EMS, per patient she has had slurred speech chronically and does not feel this is different from baseline. Patient initially orthostatic. Patient has not been taking her antibiotics for her UTI since discharge from the hospital.   Patient is a 68 y.o. female presenting with fall.  Fall Associated symptoms include weakness (chronic left sided).    Past Medical History  Diagnosis Date  . Depression   . Anxiety   . Hypertension   . MVP (mitral valve prolapse)   . Hydrocephalus   . Diabetes mellitus without complication   . Sinus bradycardia   . Dizziness   . Falls   . Acute kidney injury   . Transaminitis   . Hypotension   . Acid reflux disease    Past Surgical History  Procedure Laterality Date  . Cholecystectomy    . Brain surgery    . Ercp N/A 02/28/2014    Procedure: ENDOSCOPIC RETROGRADE CHOLANGIOPANCREATOGRAPHY (ERCP);  Surgeon: Gatha Mayer, MD;  Location: [facility] ENDOSCOPY;  Service: Endoscopy;  Laterality: N/A;  talked to tiffany from or /ebp  . Esophagogastroduodenoscopy N/A 02/28/2014    Procedure: ESOPHAGOGASTRODUODENOSCOPY (EGD);  Surgeon: Gatha Mayer, MD;  Location: [facility] ENDOSCOPY;  Service: Endoscopy;  Laterality: N/A;  .  Ventriculoperitoneal shunt      right occipital   Family History  Problem Relation Age of Onset  . Hypertension Mother   . Heart disease Mother   . Stroke Mother   . Alcohol abuse Father   . Diabetes Father   . Cancer Neg Hx    History  Substance Use Topics  . Smoking status: Never Smoker   . Smokeless tobacco: Never Used  . Alcohol Use: No   OB History   Grav Para Term Preterm Abortions TAB SAB Ect Mult Living                 Review of Systems  Neurological: Positive for weakness (chronic left sided). Negative for syncope.  All other systems reviewed and are negative.     Allergies  Metformin and related; Ace inhibitors; Amitriptyline hcl; Erythromycin; and Penicillins  Home Medications   Prior to Admission medications   Medication Sig Start Date End Date Taking? Authorizing Provider  ciprofloxacin (CIPRO) 500 MG tablet Take 1 tablet (500 mg total) by mouth 2 (two) times daily. 03/25/14 04/04/14  Lacy Duverney, PA-C  gabapentin (NEURONTIN) 300 MG capsule Take 300 mg by mouth 2 (two) times daily as needed (pain).    Historical Provider, MD  HYDROcodone-acetaminophen (NORCO/VICODIN) 5-325 MG per tablet Take 1-2 tablets by mouth every 4 (four) hours as needed for moderate pain or severe pain. 03/05/14   Harvie Heck, PA-C  ibuprofen (ADVIL,MOTRIN) 200 MG tablet Take 400 mg by mouth every 6 (six) hours as  needed for headache.    Historical Provider, MD  LORazepam (ATIVAN) 1 MG tablet Take 1 mg by mouth every 6 (six) hours as needed for anxiety. Take up to 4 times qd    Historical Provider, MD  losartan (COZAAR) 100 MG tablet Take 100 mg by mouth daily.    Historical Provider, MD  OLANZapine-FLUoxetine (SYMBYAX) 6-25 MG per capsule Take 1 capsule by mouth at bedtime.    Historical Provider, MD  solifenacin (VESICARE) 10 MG tablet Take 10 mg by mouth daily.    Historical Provider, MD  Vortioxetine HBr (BRINTELLIX) 10 MG TABS Take 10 mg by mouth every morning.     Historical  Provider, MD   BP 137/93  Pulse 60  Temp(Src) 98.3 F (36.8 C) (Oral)  Resp 15  SpO2 96% Physical Exam  Nursing note and vitals reviewed. Constitutional: She appears well-developed and well-nourished. She is sleeping. No distress.  HENT:  Head: Normocephalic and atraumatic.  Right Ear: External ear normal.  Left Ear: External ear normal.  Nose: Nose normal.  Mouth/Throat: Oropharynx is clear and moist. No oropharyngeal exudate.  Eyes: Conjunctivae and EOM are normal. Pupils are equal, round, and reactive to light.  Neck: Normal range of motion. Neck supple.  Cardiovascular: Normal rate, regular rhythm, normal heart sounds and intact distal pulses.   Pulmonary/Chest: Effort normal and breath sounds normal. No respiratory distress.  Abdominal: Soft. There is no tenderness.  Neurological: No cranial nerve deficit. Gait normal. GCS eye subscore is 4. GCS verbal subscore is 5. GCS motor subscore is 6.  Left LE strength 4/5, strength otherwise intact 5/5.  Patient slow to respond to commands.  Sensation grossly intact.  No pronator drift.  Bilateral heel-knee-shin intact.  Skin: Skin is warm and dry. She is not diaphoretic.    ED Course  Procedures (including critical care time) Medications  cefTRIAXone (ROCEPHIN) 1 g in dextrose 5 % 50 mL IVPB (0 g Intravenous Stopped 03/28/14 1418)  acetaminophen (TYLENOL) tablet 650 mg (650 mg Oral Given 03/28/14 1347)    Labs Review Labs Reviewed  PROTIME-INR - Abnormal; Notable for the following:    Prothrombin Time 15.7 (*)    All other components within normal limits  APTT - Abnormal; Notable for the following:    aPTT 38 (*)    All other components within normal limits  CBC - Abnormal; Notable for the following:    RBC 3.85 (*)    All other components within normal limits  COMPREHENSIVE METABOLIC PANEL - Abnormal; Notable for the following:    Potassium 3.6 (*)    Glucose, Bld 153 (*)    Total Protein 5.9 (*)    Albumin 2.7 (*)     AST 90 (*)    ALT 42 (*)    Alkaline Phosphatase 119 (*)    GFR calc non Af Amer 66 (*)    GFR calc Af Amer 76 (*)    All other components within normal limits  URINE RAPID DRUG SCREEN (HOSP PERFORMED) - Abnormal; Notable for the following:    Opiates POSITIVE (*)    All other components within normal limits  I-STAT CHEM 8, ED - Abnormal; Notable for the following:    Potassium 3.5 (*)    Glucose, Bld 158 (*)    Calcium, Ion 1.09 (*)    All other components within normal limits  ETHANOL  DIFFERENTIAL  URINALYSIS, ROUTINE W REFLEX MICROSCOPIC  I-STAT TROPOININ, ED  I-STAT TROPOININ, ED  I-STAT CG4 LACTIC  ACID, ED    Imaging Review Ct Head Wo Contrast  03/28/2014   CLINICAL DATA:  Pain post trauma  EXAM: CT HEAD WITHOUT CONTRAST  TECHNIQUE: Contiguous axial images were obtained from the base of the skull through the vertex without intravenous contrast.  COMPARISON:  March 22, 2014  FINDINGS: There is moderate diffuse atrophy, stable. Shunt catheter tip is in the midline just superior to the anterior most aspect of the third ventricle, stable. There is no appreciable mass, hemorrhage, extra-axial fluid collection, or midline shift. There is patchy small vessel disease in the centra semiovale bilaterally, stable. There is no new gray-white compartment lesion. No acute infarct.  Bony calvarium is intact except for the shunt defect in the right parietal bone posteriorly. There is hyperostosis frontalis interna bilaterally. Mastoid air cells clear. There is debris in the right external auditory canal.  IMPRESSION: Mild atrophy with patchy periventricular small vessel disease. No change in a ventriculoperitoneal shunt location. No intracranial mass, hemorrhage, or extra-axial fluid. Probable cerumen in the external auditory canal on the right.   Electronically Signed   By: Lowella Grip M.D.   On: 03/28/2014 13:42     EKG Interpretation None      Slurred speech resolved. Upon review  of chart, patient with chronic recurrent speech deficits at baseline.  MDM   Final diagnoses:  Fall, initial encounter    Filed Vitals:   03/28/14 1530  BP: 137/93  Pulse: 60  Temp:   Resp: 15    Afebrile, NAD, non-toxic appearing, AAOx4.  No neurofocal deficits from baseline. I have reviewed nursing notes, vital signs, and all appropriate lab and imaging results for this patient. Speech resolved during stay in ER. Patient positive for opioids, states she took her pain medication prescribed. UA w/o evidence of UTI. Patient with mechanical fall with baseline left sided weakness and speech abnormalities. Patient requesting to go home, at this time feel patient is safe for discharge. Return precautions discussed. Patient is agreeable to plan. Patient stable at time of discharge. Patient d/w with Dr. Tawnya Crook, agrees with plan.       Harlow Mares, PA-C 03/28/14 1636

## 2014-03-28 NOTE — ED Provider Notes (Signed)
CSN: [id number]     Arrival date & time 03/28/14  2047 History   First MD Initiated Contact with Patient 03/28/14 2053     Chief Complaint  Patient presents with  . Altered Mental Status     (Consider location/radiation/quality/duration/timing/severity/associated sxs/prior Treatment) HPI [name], 68 y.o. with pmh of DM, HTN, anxiety, hydrocephalus presents for dizziness. EMS reports she presents for AMS. She also c/o of a left hand sensation that is abnormal. Of note she was seen for dizziness here earlier today and discharged. She had a CT that was NAICA. The abnormal sensation in her hand has resolved, but started when she she was here, and she reports she did not tell anybody. Her dizziness is improving. No known exacerbating or relieving factors of her symptoms at this point. Denies chest pain, shortness of breath, N/V/D, abdominal pain, numbness tingling, weakness, headache, vision changes.   Past Medical History  Diagnosis Date  . Depression   . Anxiety   . Hypertension   . MVP (mitral valve prolapse)   . Hydrocephalus   . Diabetes mellitus without complication   . Sinus bradycardia   . Dizziness   . Falls   . Acute kidney injury   . Transaminitis   . Hypotension   . Acid reflux disease    Past Surgical History  Procedure Laterality Date  . Cholecystectomy    . Brain surgery    . Ercp N/A 02/28/2014    Procedure: ENDOSCOPIC RETROGRADE CHOLANGIOPANCREATOGRAPHY (ERCP);  Surgeon: Gatha Mayer, MD;  Location: [facility] ENDOSCOPY;  Service: Endoscopy;  Laterality: N/A;  talked to tiffany from or /ebp  . Esophagogastroduodenoscopy N/A 02/28/2014    Procedure: ESOPHAGOGASTRODUODENOSCOPY (EGD);  Surgeon: Gatha Mayer, MD;  Location: [facility] ENDOSCOPY;  Service: Endoscopy;  Laterality: N/A;  . Ventriculoperitoneal shunt      right occipital   Family History  Problem Relation Age of Onset  . Hypertension Mother   . Heart disease Mother   . Stroke Mother   . Alcohol abuse  Father   . Diabetes Father   . Cancer Neg Hx    History  Substance Use Topics  . Smoking status: Never Smoker   . Smokeless tobacco: Never Used  . Alcohol Use: No   OB History   Grav Para Term Preterm Abortions TAB SAB Ect Mult Living                 Review of Systems  All other systems reviewed and are negative.     Allergies  Metformin and related; Ace inhibitors; Amitriptyline hcl; Erythromycin; and Penicillins  Home Medications   Prior to Admission medications   Medication Sig Start Date End Date Taking? Authorizing Provider  ciprofloxacin (CILOXAN) 0.3 % ophthalmic solution Place 1 drop into both eyes every 2 (two) hours. Administer 1 drop, every 2 hours, while awake, for 2 days. Then 1 drop, every 4 hours, while awake, for the next 5 days.   Yes Historical Provider, MD  gabapentin (NEURONTIN) 300 MG capsule Take 300 mg by mouth 3 (three) times daily.    Yes Historical Provider, MD  HYDROcodone-acetaminophen (NORCO/VICODIN) 5-325 MG per tablet Take 1-2 tablets by mouth every 4 (four) hours as needed for moderate pain or severe pain. 03/05/14  Yes Harvie Heck, PA-C  losartan (COZAAR) 100 MG tablet Take 100 mg by mouth daily.   Yes Historical Provider, MD  metFORMIN (GLUCOPHAGE) 500 MG tablet Take 500 mg by mouth 2 (two) times daily with a  meal.   Yes Historical Provider, MD  OLANZapine-FLUoxetine (SYMBYAX) 6-25 MG per capsule Take 2 capsules by mouth at bedtime.    Yes Historical Provider, MD  solifenacin (VESICARE) 10 MG tablet Take 10 mg by mouth daily.   Yes Historical Provider, MD  tiZANidine (ZANAFLEX) 4 MG capsule Take 4 mg by mouth 3 (three) times daily.   Yes Historical Provider, MD  Vortioxetine HBr (BRINTELLIX) 10 MG TABS Take 10 mg by mouth every morning.    Yes Historical Provider, MD   BP 119/62  Pulse 50  Temp(Src) 99.1 F (37.3 C) (Oral)  Resp 18  SpO2 96% Physical Exam  Constitutional: She is oriented to person, place, and time. She appears  well-developed and well-nourished. No distress.  HENT:  Head: Normocephalic and atraumatic.  Right Ear: External ear normal.  Left Ear: External ear normal.  Eyes: Conjunctivae and EOM are normal. Right eye exhibits no discharge. Left eye exhibits no discharge.  Neck: Normal range of motion. Neck supple. No JVD present.  Cardiovascular: Normal rate, regular rhythm and normal heart sounds.  Exam reveals no gallop and no friction rub.   No murmur heard. Pulmonary/Chest: Effort normal and breath sounds normal. No stridor. No respiratory distress. She has no wheezes. She has no rales. She exhibits no tenderness.  Abdominal: Soft. Bowel sounds are normal. She exhibits no distension. There is no tenderness. There is no rebound and no guarding.  Musculoskeletal: Normal range of motion. She exhibits no edema.  Neurological: She is alert and oriented to person, place, and time. No cranial nerve deficit.  But will inconsistently slur her speech, which she reports is nml, but then can speak clearly. Possibly past pointing on Finger to nose on the left, but this is inconsistent as well Strength in flex/ext at the shoulders, elbows, wrists 5/5 bilaterally.  Inconsistent exam of the LLE as she will lift her leg at will and then will have difficulty. Strength is intact in the RLE  Skin: Skin is warm. No rash noted. She is not diaphoretic.  Psychiatric: She has a normal mood and affect. Her behavior is normal.    ED Course  Procedures (including critical care time) Labs Review Labs Reviewed  CBG MONITORING, ED - Abnormal; Notable for the following:    Glucose-Capillary 119 (*)    All other components within normal limits  CBC WITH DIFFERENTIAL  COMPREHENSIVE METABOLIC PANEL  AMMONIA  ETHANOL  URINE RAPID DRUG SCREEN (HOSP PERFORMED)    Imaging Review Ct Head Wo Contrast  03/28/2014   CLINICAL DATA:  Pain post trauma  EXAM: CT HEAD WITHOUT CONTRAST  TECHNIQUE: Contiguous axial images were  obtained from the base of the skull through the vertex without intravenous contrast.  COMPARISON:  March 22, 2014  FINDINGS: There is moderate diffuse atrophy, stable. Shunt catheter tip is in the midline just superior to the anterior most aspect of the third ventricle, stable. There is no appreciable mass, hemorrhage, extra-axial fluid collection, or midline shift. There is patchy small vessel disease in the centra semiovale bilaterally, stable. There is no new gray-white compartment lesion. No acute infarct.  Bony calvarium is intact except for the shunt defect in the right parietal bone posteriorly. There is hyperostosis frontalis interna bilaterally. Mastoid air cells clear. There is debris in the right external auditory canal.  IMPRESSION: Mild atrophy with patchy periventricular small vessel disease. No change in a ventriculoperitoneal shunt location. No intracranial mass, hemorrhage, or extra-axial fluid. Probable cerumen in the external auditory  canal on the right.   Electronically Signed   By: Lowella Grip M.D.   On: 03/28/2014 13:42     EKG Interpretation None      MDM   Final diagnoses:  None    Pt was seen for dizziness and a fall earlier today. Presents for AMS, dizziness, and an abnormal sensation in her left hand. Sensation resolved prior to arrival and was ongoing during her first ED visit, but she did not mention this. Motor function and sensation intact in BUE extremities and sensation intact in all dermatomal patterns of the UE and LE bilaterally. CN intact as well. Given NAICA earlier today, no UTI. Will repeat labs. PCO2 38 - no hypercarbia. No leukocytosis or fever here today. EtOH neg. LFTS slightly elevated from baseline. CXR neg for PNA, PTX. Given her inconsistent exam and complaints will obtain a MRI of her brain for possible posterior stroke and will consult neurology. Ammonia is not sig elevated to be the etiology of her symptoms. Plan for neurology evaluation.  If  MRI is negative and neurology indicates the patient can be dc'd, patient will be held overnight for social work consultation in the morning. She has a home health care agency who is coming out tomorrow. This will need to be coordinated with her transport so her assistance is available on arrival to home.  Imaging and labs reviewed by myself. Care discussed with my attending, Dr. Jeneen Rinks.     Kelby Aline, MD 03/29/14 [phone]

## 2014-03-28 NOTE — ED Notes (Addendum)
Pt. Reports she started feeling "not right" around 1530, pt reports slurred speech, numbness "all over", balance problems, and unable to "recall peoples names that I know" and also continues to lean to left side and is lethargic

## 2014-03-28 NOTE — ED Notes (Signed)
Pt. Urinated in the bed, sheets changed. Pt. Is more alert on arrival back from xray.

## 2014-03-29 ENCOUNTER — Encounter (HOSPITAL_COMMUNITY): Payer: Self-pay | Admitting: Emergency Medicine

## 2014-03-29 ENCOUNTER — Emergency Department (HOSPITAL_COMMUNITY): Payer: PRIVATE HEALTH INSURANCE

## 2014-03-29 DIAGNOSIS — Z9181 History of falling: Secondary | ICD-10-CM

## 2014-03-29 DIAGNOSIS — R471 Dysarthria and anarthria: Secondary | ICD-10-CM

## 2014-03-29 MED ORDER — OLANZAPINE-FLUOXETINE HCL 6-25 MG PO CAPS
2.0000 | ORAL_CAPSULE | Freq: Every day | ORAL | Status: DC
  Filled 2014-03-29 (×2): qty 2

## 2014-03-29 MED ORDER — HYDROCODONE-ACETAMINOPHEN 5-325 MG PO TABS
1.0000 | ORAL_TABLET | ORAL | Status: DC | PRN
  Administered 2014-03-29: 2 via ORAL
  Filled 2014-03-29: qty 2

## 2014-03-29 MED ORDER — METFORMIN HCL 500 MG PO TABS: 500.0000 mg | ORAL_TABLET | Freq: Two times a day (BID) | ORAL | Status: DC

## 2014-03-29 NOTE — ED Notes (Signed)
SYMBYAX ADMINISTERED FROM PT HOME MEDICATIONS AT BEDSIDE D/T PHARMACY NOT HAVING CORRECT DOSE

## 2014-03-29 NOTE — ED Notes (Signed)
Pt. Did not eat her breakfast,  PT. Stated, "I do not want it."  Gave pt. 1 packet of crackers and juice prior to taking her medication.,

## 2014-03-29 NOTE — ED Notes (Signed)
Called Dr. Jacolyn Reedy [phone] in reference to opening pt's shunt; stated will call back/reviewing radiological reports

## 2014-03-29 NOTE — ED Notes (Signed)
[facility] HAS CALLED BACK AND ADVISES THAT DR POOLE IS NOT IN TODAY. THE OFFICE IS CALLING THE ON CALL PHYSICIAN. MRI ADVISES THAT THEY WILL CALL WHEN THING ARE WORKED OUT FOR THE MRI.

## 2014-03-29 NOTE — ED Provider Notes (Signed)
Pt awake/alert, no distress, ready for d/c home I spoke to on call nsgy (nunkamar) He reviewed xray and shunt appears appropriate Pt stable for d/c home   EKG Interpretation  Date/Time:  Thursday March 28 2014 11:45:17 EDT Ventricular Rate:  62 PR Interval:  184 QRS Duration: 95 QT Interval:  465 QTC Calculation: 472 R Axis:   -3 Text Interpretation:  Sinus rhythm Abnormal R-wave progression, early transition Probable left ventricular hypertrophy Confirmed by Christy Gentles  MD, Lester Crickenberger ([id number]) on 03/29/2014 6:19:54 PM        Sharyon Cable, MD 03/29/14 [phone]

## 2014-03-29 NOTE — ED Notes (Signed)
Woke pt. Up, spoke with her concerning her Shunt.   Pt. Does not have any information or carry a card on her Shunt information.  She reported that Dr. Annette Stable from Neuro surgeon will turn her shunt back on..  She reported that he had done this 3 other times.  Reported this information to MRI Technician and she will be trying to call Dr. Annette Stable.

## 2014-03-29 NOTE — ED Notes (Signed)
MRI HAS CALLED AND THEY HAVE ARRANGE WITH DR Jacolyn Reedy TO HAVE PT SHUNT PROGRAMMED. THEY ADVISE THEY HAVE A PROCEDURE IN PROGRESS NOW AND IT WILL BE ABOUT 1-2 HOURS BEFORE THEY CAN SCAN PT

## 2014-03-29 NOTE — ED Notes (Addendum)
Repeat phone call to Dr. Jacolyn Reedy; xray of skull/head has been completed; stated shunt openings are where they are supposed to be and she will able to go home; advised doctor to call the EDP Dr. Christy Gentles for discharge info

## 2014-03-29 NOTE — ED Notes (Signed)
Pt. Updated her family on plan of care.  Warm blanket given

## 2014-03-29 NOTE — Progress Notes (Signed)
  CARE MANAGEMENT ED NOTE 03/29/2014  Patient:  [name], [name]   Account Number:  [id number]  Date Initiated:  03/29/2014  Documentation initiated by:  Edwyna Shell  Subjective/Objective Assessment:   68 yo presenting ot the ED with AMS     Subjective/Objective Assessment Detail:     Action/Plan:   [facility] HH services will continue to follow patient in home upon discharge   Action/Plan Detail:   Anticipated DC Date:       Status Recommendation to Physician:   Result of Recommendation:  Agreed    [place]  CM consult  Other    Choice offered to / List presented to:           [place].    Status of service:  Completed, signed off  ED Comments:   ED Comments Detail:  This CM consulted to coordinate discharge and [facility] services. This CM reviewed the patient's chart and spoke with the patient. It looks like [facility] HH was initiated upon patient discharge from inpatient unit on 8/24 but the patient stated she has not had a visit but they did contact the patient to try to arrange a visit. This CM called Stanton Kidney, [facility] liaison, and spoke with her regarding the patient status with [facility]. Amybeth stated that the patient had been discharged from [facility] services because for 2 days they had difficulty contacting the patient and then when the RN arrived for the visit with patient was not at home. This CM spoke with the patient regarding difficulty with [place] establishing care. The patient stated that when they came out to the house she was not there because she was in the hospital. The patient then stated that she is hesitant to have somebody come out to her house because it is very messy, cluttered, and dirty and she is worried that they will report her and she will lose her house. This CM assured the patient that they will discuss safety concerns with her related to the clutter but they will not report her to any agencies. Explained the [facility] services are there to support  her medically, to keep her safe in the home. This CM spoke with Stanton Kidney, [facility] liaison, with patient concern and she confirmed that they will not report her to an agency. Wendee stated that [facility] will continue to follow the patient and will contact her on Monday to set an appointment. This CM updated the patient with the plan. The patient verbalized understanding and appreciation and had no other questions or concerns. This CM also informed the EDP, Dr.Lockwood, that [facility] services are still in place to follow patient upon discharge home.

## 2014-03-29 NOTE — Discharge Instructions (Signed)

## 2014-03-29 NOTE — Consult Note (Signed)
Referring Physician: ED    Chief Complaint: Falls, dysarthria, left leg weakness  HPI:                                                                                                                                         [name] is an 68 y.o. female with a past medical history significant for HTN, DM, NPH s.p VP shunt placement, depression, anxiety, MVP, brought in by EMS due to AMS. Of note she was seen for dizziness and fall here earlier today and discharged and also on 8/21 after a fall. Patient stated that she has been experiencing an increase in her fall frequency mainly due to lack of balance, typically without preceding cardiac symptoms or dizziness. She said that at times she feels weak in her left knee-leg but for the most part the main problem is imbalance. [name] said that her speech has been deteriorating lately, as well as more frequent episodes of difficulty finding word out. Denies bladder incontinence. Recent UTI. Some mild HA but denies double vision, difficulty swallowing, confusion, or visual disturbances. CT brain 8/27 showed no acute intracranial abnormality and proper functioning VP shunt. Serologies unrevealing. Date last known well: uncertain  Time last known well: uncertain tPA Given: no, late presentation    Past Medical History  Diagnosis Date  . Depression   . Anxiety   . Hypertension   . MVP (mitral valve prolapse)   . Hydrocephalus   . Diabetes mellitus without complication   . Sinus bradycardia   . Dizziness   . Falls   . Acute kidney injury   . Transaminitis   . Hypotension   . Acid reflux disease     Past Surgical History  Procedure Laterality Date  . Cholecystectomy    . Brain surgery    . Ercp N/A 02/28/2014    Procedure: ENDOSCOPIC RETROGRADE CHOLANGIOPANCREATOGRAPHY (ERCP);  Surgeon: Gatha Mayer, MD;  Location[facility] ENDOSCOPY;  Service: Endoscopy;  Laterality: N/A;  talked to tiffany from or /ebp  .  Esophagogastroduodenoscopy N/A 02/28/2014    Procedure: ESOPHAGOGASTRODUODENOSCOPY (EGD);  Surgeon: Gatha Mayer, MD;  Location: [facility] ENDOSCOPY;  Service: Endoscopy;  Laterality: N/A;  . Ventriculoperitoneal shunt      right occipital    Family History  Problem Relation Age of Onset  . Hypertension Mother   . Heart disease Mother   . Stroke Mother   . Alcohol abuse Father   . Diabetes Father   . Cancer Neg Hx    Social History:  reports that she has never smoked. She has never used smokeless tobacco. She reports that she does not drink alcohol or use illicit drugs.  Allergies:  Allergies  Allergen Reactions  . Metformin And Related Diarrhea  . Ace Inhibitors Other (See Comments)    unknown  . Amitriptyline Hcl Other (See Comments)    unknown  .  Erythromycin Hives  . Penicillins Hives    Medications:                                                                                                                           I have reviewed the patient's current medications.  ROS:                                                                                                                                       History obtained from the patient and  Elnoria Howard review.  General ROS: negative for - chills, fever, night sweats, or weight loss Psychological ROS: negative for - behavioral disorder, hallucinations, or suicidal ideation Ophthalmic ROS: negative for - blurry vision, double vision, eye pain or loss of vision ENT ROS: negative for - epistaxis, nasal discharge, oral lesions, sore throat, tinnitus or vertigo Allergy and Immunology ROS: negative for - hives or itchy/watery eyes Hematological and Lymphatic ROS: negative for - bleeding problems, bruising or swollen lymph nodes Endocrine ROS: negative for - galactorrhea, hair pattern changes, polydipsia/polyuria or temperature intolerance Respiratory ROS: negative for - cough, hemoptysis, shortness of breath or  wheezing Cardiovascular ROS: negative for - chest pain, dyspnea on exertion, edema or irregular heartbeat Gastrointestinal ROS: negative for - abdominal pain, diarrhea, hematemesis, nausea/vomiting or stool incontinence Genito-Urinary ROS: negative for - dysuria, hematuria, incontinence or urinary frequency/urgency Musculoskeletal ROS: negative for - joint swelling or muscular weakness Neurological ROS: as noted in HPI Dermatological ROS: negative for rash and skin lesion changes  Physical exam: pleasant female in no apparent distress. Blood pressure 152/62, pulse 50, temperature 99.1 F (37.3 C), temperature source Oral, resp. rate 17, SpO2 95.00%. Head: normocephalic. Neck: supple, no bruits, no JVD. Cardiac: no murmurs. Lungs: clear. Abdomen: soft, no tender, no mass. Extremities: no edema. Neurologic Examination:  General: Mental Status: Alert, oriented, thought content appropriate.  Speech fluent without evidence of aphasia.  Able to follow 3 step commands without difficulty. Cranial Nerves: II: Discs flat bilaterally; Visual fields grossly normal, pupils equal, round, reactive to light and accommodation III,IV, VI: ptosis not present, extra-ocular motions intact bilaterally V,VII: smile symmetric, facial light touch sensation normal bilaterally VIII: hearing normal bilaterally IX,X: gag reflex present XI: bilateral shoulder shrug XII: midline tongue extension without atrophy or fasciculations Motor: Right : Upper extremity   5/5    Left:     Upper extremity   5/5  Lower extremity   5/5     Lower extremity   5/5 Tone and bulk:normal tone throughout; no atrophy noted Sensory: Pinprick and light touch intact throughout, bilaterally Deep Tendon Reflexes:  1+ all over Plantars: Right: downgoing   Left: downgoing Cerebellar: normal finger-to-nose,  normal heel-to-shin test Gait:  no tested for safety reasons   Results for orders placed during the hospital encounter of 03/28/14 (from the past 48 hour(s))  CBG MONITORING, ED     Status: Abnormal   Collection Time    03/28/14  9:04 PM      Result Value Ref Range   Glucose-Capillary 119 (*) 70 - 99 mg/dL   Comment 1 Notify RN     Comment 2 Documented in Chart    CBC WITH DIFFERENTIAL     Status: None   Collection Time    03/28/14  9:43 PM      Result Value Ref Range   WBC 5.6  4.0 - 10.5 K/uL   RBC 3.94  3.87 - 5.11 MIL/uL   Hemoglobin 12.6  12.0 - 15.0 g/dL   HCT 79.1  50.5 - 69.7 %   MCV 94.7  78.0 - 100.0 fL   MCH 32.0  26.0 - 34.0 pg   MCHC 33.8  30.0 - 36.0 g/dL   RDW 94.8  01.6 - 55.3 %   Platelets 176  150 - 400 K/uL   Neutrophils Relative % 71  43 - 77 %   Neutro Abs 4.0  1.7 - 7.7 K/uL   Lymphocytes Relative 17  12 - 46 %   Lymphs Abs 0.9  0.7 - 4.0 K/uL   Monocytes Relative 6  3 - 12 %   Monocytes Absolute 0.4  0.1 - 1.0 K/uL   Eosinophils Relative 5  0 - 5 %   Eosinophils Absolute 0.3  0.0 - 0.7 K/uL   Basophils Relative 1  0 - 1 %   Basophils Absolute 0.0  0.0 - 0.1 K/uL  COMPREHENSIVE METABOLIC PANEL     Status: Abnormal   Collection Time    03/28/14  9:43 PM      Result Value Ref Range   Sodium 139  137 - 147 mEq/L   Potassium 3.4 (*) 3.7 - 5.3 mEq/L   Chloride 104  96 - 112 mEq/L   CO2 21  19 - 32 mEq/L   Glucose, Bld 152 (*) 70 - 99 mg/dL   BUN 8  6 - 23 mg/dL   Creatinine, Ser 7.48  0.50 - 1.10 mg/dL   Calcium 9.1  8.4 - 27.0 mg/dL   Total Protein 6.6  6.0 - 8.3 g/dL   Albumin 2.9 (*) 3.5 - 5.2 g/dL   AST 786 (*) 0 - 37 U/L   ALT 60 (*) 0 - 35 U/L   Alkaline Phosphatase 132 (*) 39 - 117 U/L   Total Bilirubin  0.9  0.3 - 1.2 mg/dL   GFR calc non Af Amer 85 (*) >90 mL/min   GFR calc Af Amer >90  >90 mL/min   Comment: (NOTE)     The eGFR has been calculated using the CKD EPI equation.     This calculation has not been validated in all clinical situations.     eGFR's  persistently <90 mL/min signify possible Chronic Kidney     Disease.   Anion gap 14  5 - 15  ETHANOL     Status: None   Collection Time    03/28/14  9:43 PM      Result Value Ref Range   Alcohol, Ethyl (B) <11  0 - 11 mg/dL   Comment:            LOWEST DETECTABLE LIMIT FOR     SERUM ALCOHOL IS 11 mg/dL     FOR MEDICAL PURPOSES ONLY  AMMONIA     Status: None   Collection Time    03/28/14 10:22 PM      Result Value Ref Range   Ammonia 43  11 - 60 umol/L   Comment: HEMOLYSIS AT THIS LEVEL MAY AFFECT RESULT     SLIGHT HEMOLYSIS  I-STAT VENOUS BLOOD GAS, ED     Status: Abnormal   Collection Time    03/28/14 11:20 PM      Result Value Ref Range   pH, Ven 7.404 (*) 7.250 - 7.300   pCO2, Ven 38.2 (*) 45.0 - 50.0 mmHg   pO2, Ven 28.0 (*) 30.0 - 45.0 mmHg   Bicarbonate 23.9  20.0 - 24.0 mEq/L   TCO2 25  0 - 100 mmol/L   O2 Saturation 53.0     Acid-base deficit 1.0  0.0 - 2.0 mmol/L   Sample type VENOUS     Comment NOTIFIED PHYSICIAN    I-STAT CG4 LACTIC ACID, ED     Status: None   Collection Time    03/28/14 11:20 PM      Result Value Ref Range   Lactic Acid, Venous 1.78  0.5 - 2.2 mmol/L   Dg Chest 2 View  03/28/2014   CLINICAL DATA:  Altered mental status.  Mitral valve prolapse.  EXAM: CHEST  2 VIEW  COMPARISON:  03/22/2014.  FINDINGS: A poor inspiration is again demonstrated. Grossly stable enlarged cardiac silhouette and prominent pulmonary vasculature and interstitial markings. No pleural fluid. A ventriculoperitoneal shunt catheter is again demonstrated. Diffuse osteopenia.  IMPRESSION: Stable cardiomegaly, pulmonary vascular congestion and chronic interstitial lung disease.   Electronically Signed   By: Enrique Sack M.D.   On: 03/28/2014 22:57   Ct Head Wo Contrast  03/28/2014   CLINICAL DATA:  Pain post trauma  EXAM: CT HEAD WITHOUT CONTRAST  TECHNIQUE: Contiguous axial images were obtained from the base of the skull through the vertex without intravenous contrast.   COMPARISON:  March 22, 2014  FINDINGS: There is moderate diffuse atrophy, stable. Shunt catheter tip is in the midline just superior to the anterior most aspect of the third ventricle, stable. There is no appreciable mass, hemorrhage, extra-axial fluid collection, or midline shift. There is patchy small vessel disease in the centra semiovale bilaterally, stable. There is no new gray-white compartment lesion. No acute infarct.  Bony calvarium is intact except for the shunt defect in the right parietal bone posteriorly. There is hyperostosis frontalis interna bilaterally. Mastoid air cells clear. There is debris in the right external auditory canal.  IMPRESSION: Mild  atrophy with patchy periventricular small vessel disease. No change in a ventriculoperitoneal shunt location. No intracranial mass, hemorrhage, or extra-axial fluid. Probable cerumen in the external auditory canal on the right.   Electronically Signed   By: Lowella Grip M.D.   On: 03/28/2014 13:42    Assessment: 68 y.o. female brought in by EMS due to AMS and increase falls frequency. Mental status seems to be intact and she has not focal findings on exam except for inconsistent slurring of her words. Increasing falls frequency most likely NPH related but agree with pursuing MRI brain searching for stroke. Will suggest discharging patient home if MRI negative.  Will need outpatient neurology follow up and PT.  Dorian Pod, MD [facility] [phone]  03/29/2014, 12:46 AM

## 2014-03-29 NOTE — ED Notes (Signed)
Neurology at bedside.

## 2014-03-29 NOTE — ED Notes (Signed)
MD at bedside. 

## 2014-03-29 NOTE — ED Provider Notes (Signed)
Patient with second visit after a fall again at home this afternoon. Recent admission for multitude of chronic illnesses and a recent urinary tract infection. States she is taking "some" of her antibiotic at home. She feels as though she were discharged now she could go home. States she frequently falls because her left leg is weak. 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 Underwent VP shunting in 2010.    Her symptoms all seem to be chronic. Her evaluation is not show any new abnormalities. However she continues to have left lower extremity weakness.  Pt seen by Dr. Aram Beecham.  Per his evaluation, if MRI without new abnormalities, pt can be DC'd home.  Per the patient's most recent discharge summary she has had full services arrange for her home. Patient states that she did not call for these to be initiated right away. She states they're "supposed to be there tomorrow afternoon.  Issue may need a short ER office in the a.m. social work assistance with transport home and that should the services are being initiated. Patient again politely declines any suggestion of assisted living. She is in an appropriate state of mind to make this decision.    Tanna Furry, MD 03/29/14 [id number]

## 2014-04-01 ENCOUNTER — Encounter (HOSPITAL_COMMUNITY): Payer: Self-pay | Admitting: Emergency Medicine

## 2014-04-01 ENCOUNTER — Emergency Department (HOSPITAL_COMMUNITY)
Admission: EM | Admit: 2014-04-01 | Discharge: 2014-04-01 | Disposition: A | Discharge status: Home / Self Care | Payer: PRIVATE HEALTH INSURANCE | Attending: Emergency Medicine | Admitting: Emergency Medicine

## 2014-04-01 ENCOUNTER — Telehealth: Payer: Self-pay | Admitting: *Deleted

## 2014-04-01 DIAGNOSIS — E119 Type 2 diabetes mellitus without complications: Secondary | ICD-10-CM | POA: Diagnosis not present

## 2014-04-01 DIAGNOSIS — G911 Obstructive hydrocephalus: Secondary | ICD-10-CM | POA: Diagnosis not present

## 2014-04-01 DIAGNOSIS — Z9181 History of falling: Secondary | ICD-10-CM | POA: Diagnosis not present

## 2014-04-01 DIAGNOSIS — F411 Generalized anxiety disorder: Secondary | ICD-10-CM | POA: Insufficient documentation

## 2014-04-01 DIAGNOSIS — F3289 Other specified depressive episodes: Secondary | ICD-10-CM | POA: Diagnosis not present

## 2014-04-01 DIAGNOSIS — Z982 Presence of cerebrospinal fluid drainage device: Secondary | ICD-10-CM | POA: Diagnosis not present

## 2014-04-01 DIAGNOSIS — R296 Repeated falls: Secondary | ICD-10-CM | POA: Insufficient documentation

## 2014-04-01 DIAGNOSIS — Z8719 Personal history of other diseases of the digestive system: Secondary | ICD-10-CM | POA: Diagnosis not present

## 2014-04-01 DIAGNOSIS — S199XXA Unspecified injury of neck, initial encounter: Principal | ICD-10-CM

## 2014-04-01 DIAGNOSIS — F329 Major depressive disorder, single episode, unspecified: Secondary | ICD-10-CM | POA: Diagnosis not present

## 2014-04-01 DIAGNOSIS — Z79899 Other long term (current) drug therapy: Secondary | ICD-10-CM | POA: Insufficient documentation

## 2014-04-01 DIAGNOSIS — Y929 Unspecified place or not applicable: Secondary | ICD-10-CM | POA: Insufficient documentation

## 2014-04-01 DIAGNOSIS — S0993XA Unspecified injury of face, initial encounter: Secondary | ICD-10-CM | POA: Diagnosis present

## 2014-04-01 DIAGNOSIS — I10 Essential (primary) hypertension: Secondary | ICD-10-CM | POA: Insufficient documentation

## 2014-04-01 DIAGNOSIS — Y9389 Activity, other specified: Secondary | ICD-10-CM | POA: Insufficient documentation

## 2014-04-01 DIAGNOSIS — G919 Hydrocephalus, unspecified: Secondary | ICD-10-CM

## 2014-04-01 LAB — URINALYSIS, ROUTINE W REFLEX MICROSCOPIC
Bilirubin Urine: NEGATIVE
Glucose, UA: NEGATIVE mg/dL
Hgb urine dipstick: NEGATIVE
Ketones, ur: NEGATIVE mg/dL
Leukocytes, UA: NEGATIVE
Nitrite: NEGATIVE
Protein, ur: NEGATIVE mg/dL
Specific Gravity, Urine: 1.011 (ref 1.005–1.030)
Urobilinogen, UA: 1 mg/dL (ref 0.0–1.0)
pH: 7 (ref 5.0–8.0)

## 2014-04-01 LAB — BASIC METABOLIC PANEL
Anion gap: 13 (ref 5–15)
BUN: 6 mg/dL (ref 6–23)
CO2: 25 mEq/L (ref 19–32)
Calcium: 9.1 mg/dL (ref 8.4–10.5)
Chloride: 102 mEq/L (ref 96–112)
Creatinine, Ser: 0.75 mg/dL (ref 0.50–1.10)
GFR calc Af Amer: >90 mL/min (ref >90)
GFR calc non Af Amer: 86 mL/min — ABNORMAL LOW (ref >90)
Glucose, Bld: 148 mg/dL — ABNORMAL HIGH (ref 70–99)
Potassium: 3.7 mEq/L (ref 3.7–5.3)
Sodium: 140 mEq/L (ref 137–147)

## 2014-04-01 LAB — CBC WITH DIFFERENTIAL/PLATELET
Basophils Absolute: 0.1 10*3/uL (ref 0.0–0.1)
Basophils Relative: 1 % (ref 0–1)
Eosinophils Absolute: 0.2 10*3/uL (ref 0.0–0.7)
Eosinophils Relative: 3 % (ref 0–5)
HCT: 37.8 % (ref 36.0–46.0)
Hemoglobin: 12.8 g/dL (ref 12.0–15.0)
Lymphocytes Relative: 22 % (ref 12–46)
Lymphs Abs: 1.3 10*3/uL (ref 0.7–4.0)
MCH: 31.3 pg (ref 26.0–34.0)
MCHC: 33.9 g/dL (ref 30.0–36.0)
MCV: 92.4 fL (ref 78.0–100.0)
Monocytes Absolute: 0.5 10*3/uL (ref 0.1–1.0)
Monocytes Relative: 8 % (ref 3–12)
Neutro Abs: 3.9 10*3/uL (ref 1.7–7.7)
Neutrophils Relative %: 66 % (ref 43–77)
Platelets: 209 10*3/uL (ref 150–400)
RBC: 4.09 MIL/uL (ref 3.87–5.11)
RDW: 14.1 % (ref 11.5–15.5)
WBC: 5.9 10*3/uL (ref 4.0–10.5)

## 2014-04-01 LAB — CBG MONITORING, ED: Glucose-Capillary: 146 mg/dL — ABNORMAL HIGH (ref 70–99)

## 2014-04-01 MED ORDER — METOCLOPRAMIDE HCL 10 MG PO TABS
10.0000 mg | ORAL_TABLET | Freq: Once | ORAL | Status: DC
  Filled 2014-04-01: qty 1

## 2014-04-01 MED ORDER — FAMOTIDINE 20 MG PO TABS
20.0000 mg | ORAL_TABLET | Freq: Once | ORAL | Status: AC
  Administered 2014-04-01: 20 mg via ORAL
  Filled 2014-04-01: qty 1

## 2014-04-01 NOTE — ED Notes (Signed)
The patient is well-known to me. 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 The patient has some difficulty with chronic ataxia and instability which benefited somewhat with shunting. She recently has had a number of different ailments over the past few months and has required numerous MRI scans for violation. The patient underwent followup MRI scan approximately 3 days ago of her brain which demonstrated no evidence of stroke hemorrhage or other significant finding. Although the patient has stable ventricular size her gyri are shrinking in size and her sulci are increasing most consistent with progressive atrophy. I do not find any evidence of significant shunt malfunction; however given her recent MRI scan her shunt valve as been set at a higher level due to exposure to the high field magnet.  I interrogated the shunt and re\re programmed at to a level level of 0.5. The shunt itself pumps and refills well. There is no evidence of fluid built up along the shunt tract. The patient has no fever. She has no meningismus. She is awake and mildly confused. Motor and sensory examination are stable.  Overall the patient is not thriving in a home and garment. She is having increasing difficulty with ataxia and falls likely secondary to a degenerative neurologic condition. I do not feel that she has any significant element of shunt malfunction. I do not think that she is in a situation where she is safe to continue living at home and I think it is more appropriate that she be admitted to medicine service for possible skilled nursing facility placement or assisted living placement.

## 2014-04-01 NOTE — Discharge Instructions (Signed)
If you were given medicines take as directed.  If you are on coumadin or contraceptives realize their levels and effectiveness is altered by many different medicines.  If you have any reaction (rash, tongues swelling, other) to the medicines stop taking and see a physician.   Please follow up as directed and return to the ER or see a physician for new or worsening symptoms.  Thank you. Filed Vitals:   04/01/14 1743 04/01/14 2001 04/01/14 2009 04/01/14 2123  BP:   135/50 163/68  Pulse: 50 56 60 66  Temp:      TempSrc:      Resp: 15 20  20   SpO2: 99% 98% 97% 97%   Followup with home health as discussed and neurosurgery as discussed. If you change your mind and would like to come in the hospital or have placement for assisted living or nursing home please take with your Dr.

## 2014-04-01 NOTE — ED Notes (Signed)
Pt ambulated with slow even steps 15 ft, stand by assist.

## 2014-04-01 NOTE — ED Notes (Addendum)
Pt from home via [facility] with c/o right jaw pain s/p fall today and her jaw is "popping".  Hx of falls.  Pt also reports chronic left knee pain which she would like checked today.  Pt in NAD, A&O.

## 2014-04-01 NOTE — Progress Notes (Signed)
CSW spoke with pt re: role of CSW/d/c planning.  At this time, pt is not interested in placement of any type and prefers to go home with [facility].  RNCM involved.

## 2014-04-01 NOTE — Progress Notes (Signed)
Patient seen by Dr. Trenton Gammon recommendation for ALF placement, patient declined. Patient states her desire is stay home for as long as possible. ED CM spoke with patient at bedside concerning [place] [facility]. Offered choice list provided [place]  Selected. Patient is agreeable with disposition plan home with [facility] services.  Patient has equipment at home,  has rolling walker and 3:1 chair. Referral sent for RN, PT,OT SW  by fax and sent electronically Fax [phone]. Fax confirmation received.Explained that someone from [place] will contact patient within 24-48 hours, brochure also given.. Patient also given CM number to contact this CM.  Patient made aware that [facility] agency will contact patient at the number which was verified as home number. Updated Dr. Reather Converse, and Lenna Sciara on disposition plan and are agreeable. . ED CM will f/u with patient tomorrow afternoon.

## 2014-04-01 NOTE — ED Notes (Signed)
Patient now preparing for discharge per dr Reather Converse

## 2014-04-01 NOTE — Progress Notes (Signed)
  CARE MANAGEMENT ED NOTE 04/01/2014  Patient:  [name], [name]   Account Number:  [id number]  Date Initiated:  04/01/2014  Documentation initiated by:  Edwyna Shell  Subjective/Objective Assessment:   68 yo female sent to the ED post fall     Subjective/Objective Assessment Detail:     Action/Plan:   Patient dispotition pending neurology consult and if discharged home will contact [facility] agency of choice to initiate services.   Action/Plan Detail:   Anticipated DC Date:       Status Recommendation to Physician:   Result of Recommendation:        Choice offered to / List presented to:            Status of service:  In process, will continue to follow  ED Comments:   ED Comments Detail:  This CM consulted to assist with [facility] care and support in the home. This CM spoke with the patient and her friend [name] in the room. This CM received a phone call from [facility], [facility] liaison, and she informed that this CM that the patient is currently in the ED and was sent per Dr.Poole order to the [facility] RN and stated that he wants to be consulted upon the patients arrival to the ED. The contact information for the [facility] RN, Karmen Bongo, the [facility] admitting RN. She stated that the patient's right side of her body was covered in dried mud from a fall earlier that morning and used her life alert to get assistance back inside. Katharine Look stated that the patient was drooling, disoriented to time and situation, and had an unsteady gait, therefore Dr.Poole's office was contacted. Dr.Poole's secretary Wells Guiles then contacted the [facility] RN and gave the order to send the patient to the ED and have him consulted upon arrival. This CM then provided this information to EDP, Dr. Kathrynn Humble. [facility] also stated that the patient exceeds [facility] needs and they will not be able to continue serving the patient in the home. LCSW consult was placed for possible SNF placement. The patient continues to verbalize her desire to return home with  [facility] services and the support of her friend Jetty Peeks [phone]. Oncoming CM updated on the situation for any follow up [facility] needs.

## 2014-04-01 NOTE — ED Provider Notes (Signed)
Patient signed out to me to followup with neurosurgery and social worker recommendations. Patient has had recent increased falls and hydrocephalus history. Recent workup completed with brain imaging and neurosurgery consult in ER and from Dr. Marchelle Folks perspective patient is not require neurosurgical intervention however patient may be admitted for frequent falls and other evaluation. Patient is refusing assisted-living or nursing home at this time, home health will be arranged. I discussed putting the patient in the hospital for further treatment and possible placement, patient says she remembers falling and she slipped because it was wet and she did not want to come in the hospital. She will followup closely outpatient. Pt called for a ride home. Patient understands she can return at any time.  Frequent falls, hydrocephalus  Mariea Clonts, MD 04/01/14 2141

## 2014-04-01 NOTE — ED Notes (Signed)
Pt bottom and top sheet changed. Pt Gown changed.

## 2014-04-01 NOTE — ED Notes (Signed)
Placed Pt in [place]. Pt hooked up to 12Lead, BP and Pulse Ox. Blanket Given.

## 2014-04-01 NOTE — Telephone Encounter (Signed)
Left msg on triage requesting a call back this am. The nurse did not leave her cell # so I contact advance spoke with team and was provideed sandra cell # [phone]. Called nurse she stated she had went tout to see pt this am, but since the visit the pt has fell twice today. She fell on yesterday as well, but today the pt was sent back to [facility]. Dr. Trenton Gammon wanted to see pt. The case manager was also notified. The pt does not need to be home alone...Johny Chess

## 2014-04-02 ENCOUNTER — Observation Stay (HOSPITAL_COMMUNITY)
Admission: EM | Admit: 2014-04-02 | Discharge: 2014-04-04 | Disposition: A | Discharge status: Home / Self Care | Payer: PRIVATE HEALTH INSURANCE | Attending: Internal Medicine | Admitting: Internal Medicine

## 2014-04-02 ENCOUNTER — Emergency Department (HOSPITAL_COMMUNITY): Payer: PRIVATE HEALTH INSURANCE

## 2014-04-02 ENCOUNTER — Encounter (HOSPITAL_COMMUNITY): Payer: Self-pay | Admitting: Emergency Medicine

## 2014-04-02 DIAGNOSIS — R001 Bradycardia, unspecified: Secondary | ICD-10-CM

## 2014-04-02 DIAGNOSIS — R5383 Other fatigue: Secondary | ICD-10-CM | POA: Insufficient documentation

## 2014-04-02 DIAGNOSIS — S199XXA Unspecified injury of neck, initial encounter: Secondary | ICD-10-CM

## 2014-04-02 DIAGNOSIS — M899 Disorder of bone, unspecified: Secondary | ICD-10-CM | POA: Insufficient documentation

## 2014-04-02 DIAGNOSIS — Z792 Long term (current) use of antibiotics: Secondary | ICD-10-CM | POA: Diagnosis not present

## 2014-04-02 DIAGNOSIS — D510 Vitamin B12 deficiency anemia due to intrinsic factor deficiency: Secondary | ICD-10-CM

## 2014-04-02 DIAGNOSIS — I959 Hypotension, unspecified: Secondary | ICD-10-CM | POA: Insufficient documentation

## 2014-04-02 DIAGNOSIS — Y92009 Unspecified place in unspecified non-institutional (private) residence as the place of occurrence of the external cause: Secondary | ICD-10-CM | POA: Diagnosis not present

## 2014-04-02 DIAGNOSIS — E538 Deficiency of other specified B group vitamins: Secondary | ICD-10-CM | POA: Diagnosis not present

## 2014-04-02 DIAGNOSIS — R1310 Dysphagia, unspecified: Secondary | ICD-10-CM | POA: Diagnosis not present

## 2014-04-02 DIAGNOSIS — I498 Other specified cardiac arrhythmias: Secondary | ICD-10-CM | POA: Diagnosis not present

## 2014-04-02 DIAGNOSIS — S0993XA Unspecified injury of face, initial encounter: Secondary | ICD-10-CM | POA: Insufficient documentation

## 2014-04-02 DIAGNOSIS — K8020 Calculus of gallbladder without cholecystitis without obstruction: Secondary | ICD-10-CM | POA: Insufficient documentation

## 2014-04-02 DIAGNOSIS — R7402 Elevation of levels of lactic acid dehydrogenase (LDH): Secondary | ICD-10-CM | POA: Diagnosis not present

## 2014-04-02 DIAGNOSIS — N3281 Overactive bladder: Secondary | ICD-10-CM

## 2014-04-02 DIAGNOSIS — G911 Obstructive hydrocephalus: Secondary | ICD-10-CM | POA: Insufficient documentation

## 2014-04-02 DIAGNOSIS — Z880 Allergy status to penicillin: Secondary | ICD-10-CM | POA: Diagnosis not present

## 2014-04-02 DIAGNOSIS — S8990XA Unspecified injury of unspecified lower leg, initial encounter: Secondary | ICD-10-CM | POA: Diagnosis present

## 2014-04-02 DIAGNOSIS — R5381 Other malaise: Secondary | ICD-10-CM | POA: Diagnosis not present

## 2014-04-02 DIAGNOSIS — N289 Disorder of kidney and ureter, unspecified: Secondary | ICD-10-CM | POA: Diagnosis not present

## 2014-04-02 DIAGNOSIS — T400X1A Poisoning by opium, accidental (unintentional), initial encounter: Secondary | ICD-10-CM | POA: Insufficient documentation

## 2014-04-02 DIAGNOSIS — I10 Essential (primary) hypertension: Secondary | ICD-10-CM

## 2014-04-02 DIAGNOSIS — K8050 Calculus of bile duct without cholangitis or cholecystitis without obstruction: Secondary | ICD-10-CM

## 2014-04-02 DIAGNOSIS — F411 Generalized anxiety disorder: Secondary | ICD-10-CM | POA: Diagnosis not present

## 2014-04-02 DIAGNOSIS — Z1231 Encounter for screening mammogram for malignant neoplasm of breast: Secondary | ICD-10-CM

## 2014-04-02 DIAGNOSIS — W101XXA Fall (on)(from) sidewalk curb, initial encounter: Secondary | ICD-10-CM | POA: Diagnosis not present

## 2014-04-02 DIAGNOSIS — E872 Acidosis, unspecified: Secondary | ICD-10-CM

## 2014-04-02 DIAGNOSIS — F3289 Other specified depressive episodes: Secondary | ICD-10-CM | POA: Diagnosis not present

## 2014-04-02 DIAGNOSIS — N390 Urinary tract infection, site not specified: Secondary | ICD-10-CM | POA: Diagnosis not present

## 2014-04-02 DIAGNOSIS — S0990XA Unspecified injury of head, initial encounter: Secondary | ICD-10-CM | POA: Insufficient documentation

## 2014-04-02 DIAGNOSIS — G912 (Idiopathic) normal pressure hydrocephalus: Secondary | ICD-10-CM | POA: Diagnosis not present

## 2014-04-02 DIAGNOSIS — IMO0002 Reserved for concepts with insufficient information to code with codable children: Secondary | ICD-10-CM | POA: Insufficient documentation

## 2014-04-02 DIAGNOSIS — R55 Syncope and collapse: Secondary | ICD-10-CM

## 2014-04-02 DIAGNOSIS — F1110 Opioid abuse, uncomplicated: Secondary | ICD-10-CM | POA: Diagnosis not present

## 2014-04-02 DIAGNOSIS — F329 Major depressive disorder, single episode, unspecified: Secondary | ICD-10-CM | POA: Diagnosis not present

## 2014-04-02 DIAGNOSIS — M542 Cervicalgia: Secondary | ICD-10-CM

## 2014-04-02 DIAGNOSIS — Z9181 History of falling: Secondary | ICD-10-CM

## 2014-04-02 DIAGNOSIS — I059 Rheumatic mitral valve disease, unspecified: Secondary | ICD-10-CM | POA: Diagnosis not present

## 2014-04-02 DIAGNOSIS — R740 Nonspecific elevation of levels of transaminase and lactic acid dehydrogenase [LDH]: Secondary | ICD-10-CM

## 2014-04-02 DIAGNOSIS — M949 Disorder of cartilage, unspecified: Secondary | ICD-10-CM | POA: Insufficient documentation

## 2014-04-02 DIAGNOSIS — N318 Other neuromuscular dysfunction of bladder: Secondary | ICD-10-CM | POA: Insufficient documentation

## 2014-04-02 DIAGNOSIS — Z79899 Other long term (current) drug therapy: Secondary | ICD-10-CM | POA: Diagnosis not present

## 2014-04-02 DIAGNOSIS — R7989 Other specified abnormal findings of blood chemistry: Secondary | ICD-10-CM

## 2014-04-02 DIAGNOSIS — Z9049 Acquired absence of other specified parts of digestive tract: Secondary | ICD-10-CM

## 2014-04-02 DIAGNOSIS — K219 Gastro-esophageal reflux disease without esophagitis: Secondary | ICD-10-CM | POA: Diagnosis not present

## 2014-04-02 DIAGNOSIS — S99919A Unspecified injury of unspecified ankle, initial encounter: Principal | ICD-10-CM

## 2014-04-02 DIAGNOSIS — G910 Communicating hydrocephalus: Secondary | ICD-10-CM

## 2014-04-02 DIAGNOSIS — Y9389 Activity, other specified: Secondary | ICD-10-CM | POA: Insufficient documentation

## 2014-04-02 DIAGNOSIS — R531 Weakness: Secondary | ICD-10-CM

## 2014-04-02 DIAGNOSIS — N179 Acute kidney failure, unspecified: Secondary | ICD-10-CM | POA: Diagnosis not present

## 2014-04-02 DIAGNOSIS — G9340 Encephalopathy, unspecified: Secondary | ICD-10-CM | POA: Diagnosis not present

## 2014-04-02 DIAGNOSIS — R945 Abnormal results of liver function studies: Secondary | ICD-10-CM | POA: Diagnosis not present

## 2014-04-02 DIAGNOSIS — R7401 Elevation of levels of liver transaminase levels: Secondary | ICD-10-CM

## 2014-04-02 DIAGNOSIS — M8580 Other specified disorders of bone density and structure, unspecified site: Secondary | ICD-10-CM

## 2014-04-02 DIAGNOSIS — Z0000 Encounter for general adult medical examination without abnormal findings: Secondary | ICD-10-CM

## 2014-04-02 DIAGNOSIS — E119 Type 2 diabetes mellitus without complications: Secondary | ICD-10-CM | POA: Diagnosis not present

## 2014-04-02 DIAGNOSIS — R7309 Other abnormal glucose: Secondary | ICD-10-CM

## 2014-04-02 DIAGNOSIS — T50904A Poisoning by unspecified drugs, medicaments and biological substances, undetermined, initial encounter: Secondary | ICD-10-CM

## 2014-04-02 DIAGNOSIS — R296 Repeated falls: Secondary | ICD-10-CM

## 2014-04-02 DIAGNOSIS — T50901A Poisoning by unspecified drugs, medicaments and biological substances, accidental (unintentional), initial encounter: Secondary | ICD-10-CM | POA: Diagnosis present

## 2014-04-02 DIAGNOSIS — S99929A Unspecified injury of unspecified foot, initial encounter: Principal | ICD-10-CM

## 2014-04-02 DIAGNOSIS — E559 Vitamin D deficiency, unspecified: Secondary | ICD-10-CM

## 2014-04-02 LAB — COMPREHENSIVE METABOLIC PANEL
ALT: 62 U/L — ABNORMAL HIGH (ref 0–35)
AST: 116 U/L — ABNORMAL HIGH (ref 0–37)
Albumin: 2.9 g/dL — ABNORMAL LOW (ref 3.5–5.2)
Alkaline Phosphatase: 151 U/L — ABNORMAL HIGH (ref 39–117)
Anion gap: 12 (ref 5–15)
BUN: 8 mg/dL (ref 6–23)
CO2: 26 mEq/L (ref 19–32)
Calcium: 9.2 mg/dL (ref 8.4–10.5)
Chloride: 101 mEq/L (ref 96–112)
Creatinine, Ser: 0.85 mg/dL (ref 0.50–1.10)
GFR calc Af Amer: 80 mL/min — ABNORMAL LOW (ref >90)
GFR calc non Af Amer: 69 mL/min — ABNORMAL LOW (ref >90)
Glucose, Bld: 198 mg/dL — ABNORMAL HIGH (ref 70–99)
Potassium: 3.8 mEq/L (ref 3.7–5.3)
Sodium: 139 mEq/L (ref 137–147)
Total Bilirubin: 1.3 mg/dL — ABNORMAL HIGH (ref 0.3–1.2)
Total Protein: 6.6 g/dL (ref 6.0–8.3)

## 2014-04-02 LAB — CBC
HCT: 38.4 % (ref 36.0–46.0)
Hemoglobin: 12.8 g/dL (ref 12.0–15.0)
MCH: 31.4 pg (ref 26.0–34.0)
MCHC: 33.3 g/dL (ref 30.0–36.0)
MCV: 94.1 fL (ref 78.0–100.0)
Platelets: 198 10*3/uL (ref 150–400)
RBC: 4.08 MIL/uL (ref 3.87–5.11)
RDW: 14.2 % (ref 11.5–15.5)
WBC: 5.7 10*3/uL (ref 4.0–10.5)

## 2014-04-02 LAB — URINALYSIS, ROUTINE W REFLEX MICROSCOPIC
Glucose, UA: NEGATIVE mg/dL
Hgb urine dipstick: NEGATIVE
Ketones, ur: NEGATIVE mg/dL
Leukocytes, UA: NEGATIVE
Nitrite: NEGATIVE
Protein, ur: NEGATIVE mg/dL
Specific Gravity, Urine: 1.013 (ref 1.005–1.030)
Urobilinogen, UA: 2 mg/dL — ABNORMAL HIGH (ref 0.0–1.0)
pH: 6.5 (ref 5.0–8.0)

## 2014-04-02 LAB — TSH: TSH: 2.65 u[IU]/mL (ref 0.350–4.500)

## 2014-04-02 LAB — I-STAT TROPONIN, ED: Troponin i, poc: 0 ng/mL (ref 0.00–0.08)

## 2014-04-02 LAB — AMMONIA: Ammonia: 34 umol/L (ref 11–60)

## 2014-04-02 NOTE — ED Notes (Signed)
No answer X1

## 2014-04-02 NOTE — Care Management (Signed)
ED CM noted patient in hall bed awaiting a room.  Patient was just discharged yesterday from ED with [facility] services, s/p fall, Patient present to [facility] ED via EMS s/p fall tonight as well. CM  has safety concerns. Spoke with CSW concerning patient's safety,  recommendation for psych consult to determine capacity. Patient is requiring a higher level of care than previously determined, placement may be an option if determined by psych as a safe discharge plan.  ED CM spoke with E. Danne Baxter PA-C  regarding recommendations for disposition, she is in agreement. Orders for TTS has been placed. Patient will be moved to [place] for further evaluation. Will notify [facility] agency. BH and SW will continue to follow for disposition plan.

## 2014-04-02 NOTE — ED Notes (Signed)
Patient returned from [place]. Phlebotomy at the bedside.

## 2014-04-02 NOTE — ED Notes (Signed)
Pt to ED for a fall down 3 brick steps prior to arrival- pt complaining of right knee pain.  Reports hitting her head during the fall- denies LOC.  Pt spells of urine in triage.  Denies any other complaints.

## 2014-04-02 NOTE — ED Notes (Signed)
Hospitalist at the bedside 

## 2014-04-02 NOTE — ED Provider Notes (Signed)
CSN: [id number]     Arrival date & time 04/02/14  1912 History   First MD Initiated Contact with Patient 04/02/14 2114     Chief Complaint  Patient presents with  . Fall     (Consider location/radiation/quality/duration/timing/severity/associated sxs/prior Treatment) HPI Pt is a 68yo female presenting to ED via EMS from home after fall down 3 brick steps prior to arrival, pt c/o right knee pain.  Pt does report hitting her head during the fall but denies LOC.  Per triage note, pt smells of urine in triage.  Pt was seen yesterday in ED for similar c/o frequent falls, hx of generalized weakness and hydrocephalus. Pt was evaluated by Dr. Annette Stable, neurosurgery yesterday who does not believe pt's symptoms are due to her shunt, however, also suggested pt be admitted, per his note yesterday, Dr. Annette Stable stated  "I do not think that she is in a situation where she is safe to continue living at home and I think it is more appropriate that she be admitted to medicine service for possible skilled nursing facility placement or assisted living placement."  However, yesterday, pt was more alert and oriented and able to refuse to be admitted.  Pt was scheduled to f/u with neurology and social work today.   PCP: Dr. [name], [place]  Past Medical History  Diagnosis Date  . Depression   . Anxiety   . Hypertension   . MVP (mitral valve prolapse)   . Hydrocephalus   . Diabetes mellitus without complication   . Sinus bradycardia   . Dizziness   . Falls   . Acute kidney injury   . Transaminitis   . Hypotension   . Acid reflux disease    Past Surgical History  Procedure Laterality Date  . Cholecystectomy    . Brain surgery    . Ercp N/A 02/28/2014    Procedure: ENDOSCOPIC RETROGRADE CHOLANGIOPANCREATOGRAPHY (ERCP);  Surgeon: Gatha Mayer, MD;  Location: [facility] ENDOSCOPY;  Service: Endoscopy;  Laterality: N/A;  talked to tiffany from or /ebp  . Esophagogastroduodenoscopy N/A 02/28/2014    Procedure:  ESOPHAGOGASTRODUODENOSCOPY (EGD);  Surgeon: Gatha Mayer, MD;  Location: [facility] ENDOSCOPY;  Service: Endoscopy;  Laterality: N/A;  . Ventriculoperitoneal shunt      right occipital   Family History  Problem Relation Age of Onset  . Hypertension Mother   . Heart disease Mother   . Stroke Mother   . Alcohol abuse Father   . Diabetes Father   . Cancer Neg Hx    History  Substance Use Topics  . Smoking status: Never Smoker   . Smokeless tobacco: Never Used  . Alcohol Use: No   OB History   Grav Para Term Preterm Abortions TAB SAB Ect Mult Living                 Review of Systems  Unable to perform ROS: Mental status change      Allergies  Metformin and related; Ace inhibitors; Amitriptyline hcl; Erythromycin; and Penicillins  Home Medications   Prior to Admission medications   Medication Sig Start Date End Date Taking? Authorizing Provider  gabapentin (NEURONTIN) 300 MG capsule Take 300 mg by mouth 3 (three) times daily.    Yes Historical Provider, MD  HYDROcodone-acetaminophen (NORCO/VICODIN) 5-325 MG per tablet Take 1 tablet by mouth every 6 (six) hours as needed for moderate pain.   Yes Historical Provider, MD  LORazepam (ATIVAN) 1 MG tablet Take 1 mg by mouth 4 (four) times  daily as needed. For anxiousness 03/20/14  Yes Historical Provider, MD  losartan (COZAAR) 100 MG tablet Take 100 mg by mouth daily.   Yes Historical Provider, MD  metFORMIN (GLUCOPHAGE) 500 MG tablet Take 500 mg by mouth 2 (two) times daily with a meal.   Yes Historical Provider, MD  OLANZapine-FLUoxetine (SYMBYAX) 6-25 MG per capsule Take 2 capsules by mouth at bedtime.    Yes Historical Provider, MD  solifenacin (VESICARE) 10 MG tablet Take 10 mg by mouth daily.   Yes Historical Provider, MD  tiZANidine (ZANAFLEX) 4 MG capsule Take 4 mg by mouth 3 (three) times daily.   Yes Historical Provider, MD  Vortioxetine HBr (BRINTELLIX) 10 MG TABS Take 10 mg by mouth every morning.    Yes Historical Provider, MD   ciprofloxacin (CILOXAN) 0.3 % ophthalmic solution Place 1 drop into both eyes every 2 (two) hours. Administer 1 drop, every 2 hours, while awake, for 2 days. Then 1 drop, every 4 hours, while awake, for the next 5 days.    Historical Provider, MD   BP 167/67  Pulse 44  Temp(Src) 97.7 F (36.5 C) (Oral)  Resp 16  Ht 5\' 5"  (1.651 m)  Wt 246 lb (111.585 kg)  BMI 40.94 kg/m2  SpO2 96% Physical Exam  Nursing note and vitals reviewed. Constitutional: She appears well-developed and well-nourished. No distress.  Elderly female lying in exam bed, NAD.  HENT:  Head: Normocephalic and atraumatic.  Eyes: Conjunctivae are normal. No scleral icterus.  Neck: Normal range of motion. Neck supple.  Cardiovascular: Regular rhythm and normal heart sounds.  Bradycardia present.   Pulmonary/Chest: Effort normal and breath sounds normal. No respiratory distress. She has no wheezes. She has no rales. She exhibits no tenderness.  Abdominal: Soft. Bowel sounds are normal. She exhibits no distension and no mass. There is no tenderness. There is no rebound and no guarding.  Musculoskeletal: Normal range of motion.  Neurological: She is alert. She has normal strength. No cranial nerve deficit.  Pt is alert, oriented to person and time but not to place. Pt occasionally on task and follows commands but then changes to unrelated topic or will stop while in middle of neuro exam like the EOM.  When asked when she obtained bruise on right wrist pt started talking about a coliseum graduation.  When asked where she was, pt rambles then states "[place]"   Skin: Skin is warm and dry. She is not diaphoretic.  Psychiatric: Her mood appears not anxious. Her speech is tangential and slurred. She is slowed. She is not agitated, not aggressive and not actively hallucinating. She exhibits a depressed mood.    ED Course  Procedures (including critical care time) Labs Review Labs Reviewed  COMPREHENSIVE METABOLIC PANEL -  Abnormal; Notable for the following:    Glucose, Bld 198 (*)    Albumin 2.9 (*)    AST 116 (*)    ALT 62 (*)    Alkaline Phosphatase 151 (*)    Total Bilirubin 1.3 (*)    GFR calc non Af Amer 69 (*)    GFR calc Af Amer 80 (*)    All other components within normal limits  AMMONIA  CBC  URINALYSIS, ROUTINE W REFLEX MICROSCOPIC  TSH  I-STAT TROPOININ, ED    Imaging Review Ct Head Wo Contrast  04/02/2014   CLINICAL DATA:  Fall, altered mental status  EXAM: CT HEAD WITHOUT CONTRAST  TECHNIQUE: Contiguous axial images were obtained from the base of  the skull through the vertex without intravenous contrast.  COMPARISON:  Prior CT from 03/28/2014  FINDINGS: Right parietal approach shunt catheter in place with tip near the septum pellucidum, unchanged. Ventricular size is stable from prior exam.  Atrophy with mild chronic small vessel ischemic changes again noted.  There is no acute intracranial hemorrhage or infarct. No mass lesion or midline shift. Gray-white matter differentiation is well maintained. CSF containing spaces are within normal limits. No extra-axial fluid collection.  The calvarium is intact.  Orbital soft tissues are within normal limits.  The paranasal sinuses and mastoid air cells are well pneumatized and free of fluid.  Scalp soft tissues are unremarkable.  IMPRESSION: 1. No acute intracranial process. 2. Stable position of right parietal approach ventricular catheter with no significant interval change in ventricular size. 3. Mild atrophy with patchy periventricular small vessel disease.   Electronically Signed   By: Jeannine Boga M.D.   On: 04/02/2014 23:20     EKG Interpretation   Date/Time:  Tuesday April 02 2014 23:05:31 EDT Ventricular Rate:  43 PR Interval:  185 QRS Duration: 106 QT Interval:  576 QTC Calculation: 487 R Axis:     Text Interpretation:  Sinus bradycardia Borderline prolonged QT interval  Confirmed by Wilson Singer  MD, [place] ([id number]) on 04/02/2014  11:11:04 PM      MDM   Final diagnoses:  Falls frequently  Acute encephalopathy  Diabetes mellitus type 2, controlled  Sinus bradycardia    Pt is a 68yo female presenting to ED reports of fall today while at home. Yesterday, pt was evaluated for same as well as gradually worsening AMS however pt was more alert and oriented yesterday and able to refuse admission. Today, pt does not seem to be aware of place. Pt is tangential and slurred with speech. No evidence of head trauma.  Pt is not on blood thinners.  Vital signs: pt is bradycardic with HR in 40s, however, hx of same on 8/28.Discussed pt with Dr. Wilson Singer who also examined medical records and labs that were performed yesterday. Dr. Wilson Singer does not believe repeat imaging or labs will change tx plan at this time as pt needs to be admitted for further evaluation of AMS and placement in a skilled nursing facility as recommended by Dr. Annette Stable, neurosurgery yesterday.  Will consult with hospitalist to admit pt.    Consulted with Dr. Hal Hope who requested pt have repeat labs and will include TSH and ammonia level as well as head CT due to change in mental status from yesterday.  Pt will be admitted.       Noland Fordyce, PA-C 04/02/14 2341

## 2014-04-02 NOTE — ED Notes (Signed)
Per EMS: pt coming from home with left knee pain. Pt states she fell at 1800. Pt c/o 1/10 pain

## 2014-04-02 NOTE — ED Notes (Signed)
PA at the bedside.

## 2014-04-02 NOTE — Care Management (Signed)
ED CM attempted to contact patient by phone no answer, Will make second attempt tomorrow.Marland Kitchen

## 2014-04-02 NOTE — H&P (Addendum)
[facility] History and Physical  [name]:[id number] DOB: 16-Sep-1945 DOA: 04/02/2014  Referring physician: ER physician. PCP: [name], MD  Chief Complaint: Falls and confusion.  HPI: [name] is a 68 y.o. 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 Patient states she did hit her head but did not lose consciousness. Patient states he was trying to reach for the phone when she slipped and fell. Denies any chest pain shortness of breath or palpitations. Patient has been having frequent falls and had come to the ER yesterday. At that time patient's neurosurgeon Dr. Trenton Gammon had evaluated the patient and stated that patient's VP shunt has been functioning well but had recommended admission for possible placement given that patient has been having frequent falls but patient has per the ER physician requested to go back home. Patient's EKG shows sinus bradycardia with heart rate around 42 beats per minute. Patient also has found to have episodes of confusion in the ER. CT head did not show anything acute in the ER. Patient is afebrile.   Review of Systems: As presented in the history of presenting illness, rest negative.  Past Medical History  Diagnosis Date  . Depression   . Anxiety   . Hypertension   . MVP (mitral valve prolapse)   . Hydrocephalus   . Diabetes mellitus without complication   . Sinus bradycardia   . Dizziness   . Falls   . Acute kidney injury   . Transaminitis   . Hypotension   . Acid reflux disease    Past Surgical History  Procedure Laterality Date  . Cholecystectomy    . Brain surgery    . Ercp N/A 02/28/2014    Procedure: ENDOSCOPIC RETROGRADE CHOLANGIOPANCREATOGRAPHY (ERCP);  Surgeon: Gatha Mayer, MD;  Location: [facility] ENDOSCOPY;   Service: Endoscopy;  Laterality: N/A;  talked to tiffany from or /ebp  . Esophagogastroduodenoscopy N/A 02/28/2014    Procedure: ESOPHAGOGASTRODUODENOSCOPY (EGD);  Surgeon: Gatha Mayer, MD;  Location: [facility] ENDOSCOPY;  Service: Endoscopy;  Laterality: N/A;  . Ventriculoperitoneal shunt      right occipital   Social History:  reports that she has never smoked. She has never used smokeless tobacco. She reports that she does not drink alcohol or use illicit drugs. Where does patient live home. Can patient participate in ADLs? Yes.  Allergies  Allergen Reactions  . Metformin And Related Diarrhea    (takes metformin at home w/ meals)  . Ace Inhibitors Other (See Comments)    unknown  . Amitriptyline Hcl Other (See Comments)    unknown  . Erythromycin Hives  . Penicillins Hives    Family History:  Family History  Problem Relation Age of Onset  . Hypertension Mother   . Heart disease Mother   . Stroke Mother   . Alcohol abuse Father   . Diabetes Father   . Cancer Neg Hx       Prior to Admission medications   Medication Sig Start Date End Date Taking? Authorizing Provider  gabapentin (NEURONTIN) 300 MG capsule Take 300 mg by mouth 3 (three) times daily.    Yes Historical Provider, MD  HYDROcodone-acetaminophen (NORCO/VICODIN) 5-325 MG per tablet Take 1 tablet by mouth every 6 (six) hours as needed for moderate pain.   Yes Historical Provider, MD  LORazepam (  ATIVAN) 1 MG tablet Take 1 mg by mouth 4 (four) times daily as needed. For anxiousness 03/20/14  Yes Historical Provider, MD  losartan (COZAAR) 100 MG tablet Take 100 mg by mouth daily.   Yes Historical Provider, MD  metFORMIN (GLUCOPHAGE) 500 MG tablet Take 500 mg by mouth 2 (two) times daily with a meal.   Yes Historical Provider, MD  OLANZapine-FLUoxetine (SYMBYAX) 6-25 MG per capsule Take 2 capsules by mouth at bedtime.    Yes Historical Provider, MD  solifenacin (VESICARE) 10 MG tablet Take 10 mg by mouth daily.   Yes  Historical Provider, MD  tiZANidine (ZANAFLEX) 4 MG capsule Take 4 mg by mouth 3 (three) times daily.   Yes Historical Provider, MD  Vortioxetine HBr (BRINTELLIX) 10 MG TABS Take 10 mg by mouth every morning.    Yes Historical Provider, MD  ciprofloxacin (CILOXAN) 0.3 % ophthalmic solution Place 1 drop into both eyes every 2 (two) hours. Administer 1 drop, every 2 hours, while awake, for 2 days. Then 1 drop, every 4 hours, while awake, for the next 5 days.    Historical Provider, MD    Physical Exam: Filed Vitals:   04/02/14 2230 04/02/14 2305 04/02/14 2315 04/02/14 2334  BP: 169/64 177/69 167/67   Pulse: 42 43 44   Temp:    97.7 F (36.5 C)  TempSrc:      Resp: 14 15 16    Height:      Weight:      SpO2: 95% 95% 96%      General:  Well-developed and nourished.  Eyes: Anicteric no pallor.  ENT: No discharge from the ears eyes nose mouth.  Neck: No mass felt.  Cardiovascular: S1-S2 heard.  Respiratory: No rhonchi or crepitations.  Abdomen: Soft nontender bowel sounds present. No guarding rigidity.  Skin: No rash.  Musculoskeletal: No edema.  Psychiatric: While talking to the patient patient has periods of confusion.  Neurologic: Alert awake oriented to time place and person but has periods of confusion while talking. Moves all extremities 5 x 5. No facial asymmetry. Tongue is midline.  Labs on Admission:  Basic Metabolic Panel:  Recent Labs Lab 03/28/14 1157 03/28/14 1250 03/28/14 2143 04/01/14 1422 04/02/14 2303  NA 142 139 139 140 139  K 3.6* 3.5* 3.4* 3.7 3.8  CL 105 110 104 102 101  CO2 23  --  21 25 26   GLUCOSE 153* 158* 152* 148* 198*  BUN 9 7 8 6 8   CREATININE 0.89 0.90 0.77 0.75 0.85  CALCIUM 8.5  --  9.1 9.1 9.2   Liver Function Tests:  Recent Labs Lab 03/28/14 1157 03/28/14 2143 04/02/14 2303  AST 90* 147* 116*  ALT 42* 60* 62*  ALKPHOS 119* 132* 151*  BILITOT 1.0 0.9 1.3*  PROT 5.9* 6.6 6.6  ALBUMIN 2.7* 2.9* 2.9*   No results found  for this basename: LIPASE, AMYLASE,  in the last 168 hours  Recent Labs Lab 03/28/14 2222 04/02/14 2303  AMMONIA 43 34   CBC:  Recent Labs Lab 03/28/14 1157 03/28/14 1250 03/28/14 2143 04/01/14 1422 04/02/14 2303  WBC 5.1  --  5.6 5.9 5.7  NEUTROABS 3.9  --  4.0 3.9  --   HGB 12.1 12.6 12.6 12.8 12.8  HCT 36.4 37.0 37.3 37.8 38.4  MCV 94.5  --  94.7 92.4 94.1  PLT 157  --  176 209 198   Cardiac Enzymes: No results found for this basename: CKTOTAL, CKMB, CKMBINDEX, TROPONINI,  in the last 168 hours  BNP (last 3 results) No results found for this basename: PROBNP,  in the last 8760 hours CBG:  Recent Labs Lab 03/28/14 2104 03/29/14 0947  GLUCAP 119* 146*    Radiological Exams on Admission: Ct Head Wo Contrast  04/02/2014   CLINICAL DATA:  Fall, altered mental status  EXAM: CT HEAD WITHOUT CONTRAST  TECHNIQUE: Contiguous axial images were obtained from the base of the skull through the vertex without intravenous contrast.  COMPARISON:  Prior CT from 03/28/2014  FINDINGS: Right parietal approach shunt catheter in place with tip near the septum pellucidum, unchanged. Ventricular size is stable from prior exam.  Atrophy with mild chronic small vessel ischemic changes again noted.  There is no acute intracranial hemorrhage or infarct. No mass lesion or midline shift. Gray-white matter differentiation is well maintained. CSF containing spaces are within normal limits. No extra-axial fluid collection.  The calvarium is intact.  Orbital soft tissues are within normal limits.  The paranasal sinuses and mastoid air cells are well pneumatized and free of fluid.  Scalp soft tissues are unremarkable.  IMPRESSION: 1. No acute intracranial process. 2. Stable position of right parietal approach ventricular catheter with no significant interval change in ventricular size. 3. Mild atrophy with patchy periventricular small vessel disease.   Electronically Signed   By: Jeannine Boga M.D.    On: 04/02/2014 23:20    EKG: Independently reviewed. Sinus bradycardia.  Assessment/Plan Principal Problem:   Acute encephalopathy Active Problems:   HYDROCEPHALUS, NORMAL PRESSURE   HYPERTENSION   Falls frequently   Sinus bradycardia   Diabetes mellitus type 2, controlled   1. Frequent falls with confusion/acute encephalopathy/delirium - patient's EKG does show sinus bradycardia and at this time we will closely monitor in telemetry for any further worsening. Consult cardiology in a.m. to see if this may be causing her frequent falls. Patient does have history of normal pressure hydrocephalus and was seen by patient's neurosurgeon yesterday and stated that patient's VP shunt is functioning fine and patient's symptoms are not due to normal pressure hydrocephalus. Check ammonia levels and TSH. Confusion may be related to some of patient's medications. Get physical therapy consult. 2. Bradycardia - see #1. 3. Elevated LFTs - patient has been recently admitted and was found to have elevated LFTs. Hepatitis panel was negative sonogram of the abdomen was unremarkable. Follow LFTs. Check Tylenol levels. 4. Diabetes mellitus type 2 - patient has been placed on sliding-scale coverage. Closely follow CBGs. 5. Hypertension - continue home medications. Check orthostatics. 6. Normal pressure hydrocephalus status post VP shunt placement - see Dr. Irven Baltimore consult yesterday on Epic.  Patient may need nursing home placement for which I have consulted social work and physical therapy.  Code Status: Full code.  Family Communication: None.  Disposition Plan: Admit to inpatient.    Nickie Warwick N. [facility] Pager [phone].  If 7PM-7AM, please contact night-coverage [email or web address] Password [facility] 04/02/2014, 11:38 PM

## 2014-04-03 ENCOUNTER — Encounter (HOSPITAL_COMMUNITY): Payer: Self-pay | Admitting: Internal Medicine

## 2014-04-03 DIAGNOSIS — T50901A Poisoning by unspecified drugs, medicaments and biological substances, accidental (unintentional), initial encounter: Secondary | ICD-10-CM

## 2014-04-03 DIAGNOSIS — N179 Acute kidney failure, unspecified: Secondary | ICD-10-CM

## 2014-04-03 LAB — ACETAMINOPHEN LEVEL: Acetaminophen (Tylenol), Serum: <15 ug/mL (ref 10–30)

## 2014-04-03 LAB — GLUCOSE, CAPILLARY
Glucose-Capillary: 145 mg/dL — ABNORMAL HIGH (ref 70–99)
Glucose-Capillary: 146 mg/dL — ABNORMAL HIGH (ref 70–99)
Glucose-Capillary: 157 mg/dL — ABNORMAL HIGH (ref 70–99)
Glucose-Capillary: 146 mg/dL — ABNORMAL HIGH (ref 70–99)

## 2014-04-03 LAB — RAPID URINE DRUG SCREEN, HOSP PERFORMED
Amphetamines: NOT DETECTED
Barbiturates: NOT DETECTED
Benzodiazepines: NOT DETECTED
Cocaine: NOT DETECTED
Opiates: POSITIVE — AB
Tetrahydrocannabinol: NOT DETECTED

## 2014-04-03 MED ORDER — TIZANIDINE HCL 4 MG PO TABS
4.0000 mg | ORAL_TABLET | Freq: Three times a day (TID) | ORAL | Status: DC
  Filled 2014-04-03 (×3): qty 1

## 2014-04-03 MED ORDER — LOSARTAN POTASSIUM 50 MG PO TABS
100.0000 mg | ORAL_TABLET | Freq: Every day | ORAL | Status: DC
  Administered 2014-04-04: 100 mg via ORAL
  Filled 2014-04-03 (×2): qty 2

## 2014-04-03 MED ORDER — ACETAMINOPHEN 325 MG PO TABS: 650.0000 mg | ORAL_TABLET | ORAL | Status: DC | PRN

## 2014-04-03 MED ORDER — ENOXAPARIN SODIUM 40 MG/0.4ML ~~LOC~~ SOLN
40.0000 mg | SUBCUTANEOUS | Status: DC
  Administered 2014-04-03 – 2014-04-04 (×2): 40 mg via SUBCUTANEOUS
  Filled 2014-04-03 (×2): qty 0.4

## 2014-04-03 MED ORDER — FAMOTIDINE 20 MG PO TABS
20.0000 mg | ORAL_TABLET | Freq: Two times a day (BID) | ORAL | Status: DC | PRN
  Administered 2014-04-03 – 2014-04-04 (×2): 20 mg via ORAL
  Filled 2014-04-03 (×3): qty 1

## 2014-04-03 MED ORDER — HYDROCODONE-ACETAMINOPHEN 5-325 MG PO TABS
1.0000 | ORAL_TABLET | Freq: Four times a day (QID) | ORAL | Status: DC | PRN
  Administered 2014-04-04 (×2): 1 via ORAL
  Filled 2014-04-03 (×2): qty 1

## 2014-04-03 MED ORDER — LORAZEPAM 1 MG PO TABS: 1.0000 mg | ORAL_TABLET | Freq: Four times a day (QID) | ORAL | Status: DC | PRN

## 2014-04-03 MED ORDER — ONDANSETRON HCL 4 MG/2ML IJ SOLN
4.0000 mg | Freq: Once | INTRAMUSCULAR | Status: AC
  Administered 2014-04-03: 4 mg via INTRAVENOUS
  Filled 2014-04-03: qty 2

## 2014-04-03 MED ORDER — GABAPENTIN 300 MG PO CAPS
300.0000 mg | ORAL_CAPSULE | Freq: Three times a day (TID) | ORAL | Status: DC
  Administered 2014-04-03 – 2014-04-04 (×4): 300 mg via ORAL
  Filled 2014-04-03 (×6): qty 1

## 2014-04-03 MED ORDER — OLANZAPINE-FLUOXETINE HCL 6-25 MG PO CAPS
2.0000 | ORAL_CAPSULE | Freq: Every day | ORAL | Status: DC
  Administered 2014-04-03: 2 via ORAL
  Filled 2014-04-03 (×3): qty 2

## 2014-04-03 MED ORDER — DARIFENACIN HYDROBROMIDE ER 7.5 MG PO TB24
7.5000 mg | ORAL_TABLET | Freq: Every day | ORAL | Status: DC
  Administered 2014-04-04: 7.5 mg via ORAL
  Filled 2014-04-03 (×2): qty 1

## 2014-04-03 MED ORDER — METFORMIN HCL 500 MG PO TABS
500.0000 mg | ORAL_TABLET | Freq: Two times a day (BID) | ORAL | Status: DC
  Administered 2014-04-03 – 2014-04-04 (×3): 500 mg via ORAL
  Filled 2014-04-03 (×4): qty 1

## 2014-04-03 MED ORDER — INSULIN ASPART 100 UNIT/ML ~~LOC~~ SOLN
0.0000 [IU] | Freq: Three times a day (TID) | SUBCUTANEOUS | Status: DC
  Administered 2014-04-03: 1 [IU] via SUBCUTANEOUS
  Administered 2014-04-03: 2 [IU] via SUBCUTANEOUS
  Administered 2014-04-03 – 2014-04-04 (×3): 1 [IU] via SUBCUTANEOUS

## 2014-04-03 MED ORDER — VORTIOXETINE HBR 10 MG PO TABS
10.0000 mg | ORAL_TABLET | Freq: Every day | ORAL | Status: DC
  Filled 2014-04-03: qty 1

## 2014-04-03 NOTE — ED Notes (Signed)
Attempted IV x2. Patient comfortable. Made aware of the plan of care.

## 2014-04-03 NOTE — Evaluation (Addendum)
Physical Therapy Evaluation Patient Details Name: [name] MRN: [id number] DOB: Nov 24, 1945 Today's Date: 04/03/2014   History of Present Illness  Patient is a 68 y/o female with PMH of NPH s/p VP shunt placement, DM, HTN, chronic pain and depression was brought to the ER after a fall witnessed by a friend. Patient states she did hit her head but did not lose consciousness. Patient's EKG shows sinus bradycardia with heart rate around 42 bpm. Recently admitted to hospital on 8/21 and to the ER 8/31. Admitted with acute encephalopathy.   Clinical Impression  Patient presents with functional limitations due to deficits listed in PT problem list (see below). Pt with generalized weakness, poor safety awareness and balance deficits limiting safe mobility. Pt not safe to go home alone due to fall history and safety concerns. Pt adamant about returning home. Recommend use of RW at all times with mobility and not the cane. Recommend ST SNF, however pt likely to refuse. If pt refuses, recommend HHPT and [place] aide to assist with self care and improve mobility. Pt would benefit from skilled acute PT to improve transfers, gait, balance and overall mobility so pt can maximize independence and reduce fall risk prior to return home.    Follow Up Recommendations SNF;Supervision/Assistance - 24 hour    Equipment Recommendations  None recommended by PT    Recommendations for Other Services OT consult     Precautions / Restrictions Precautions Precautions: Fall Precaution Comments: Has VP shunt Restrictions Weight Bearing Restrictions: No      Mobility  Bed Mobility Overal bed mobility: Modified Independent             General bed mobility comments: HOB flat, no rails. Increased time.  Transfers Overall transfer level: Needs assistance Equipment used: None Transfers: Sit to/from Stand Sit to Stand: Min guard         General transfer comment: VC for hand placement and  technique.  Ambulation/Gait Ambulation/Gait assistance: Min assist Ambulation Distance (Feet): 150 Feet (+50' with SPC.) Assistive device: Rolling walker (2 wheeled);Straight cane Gait Pattern/deviations: Step-to pattern;Step-through pattern;Trunk flexed;Decreased stride length Gait velocity: 1.3 ft/sec   General Gait Details: Ambulated with RW vs SPC. Balance improved with use of RW. Poor safety awareness. VC for RW management. Reports fear of LLE "giving out." Dyspnea present. Vitals stable. Not able to comprehend sequencing with SPC even with maximal verbal cues demonstrating step to gait pattern.  Stairs            Wheelchair Mobility    Modified Rankin (Stroke Patients Only)       Balance Overall balance assessment: Needs assistance;History of Falls Sitting-balance support: Feet supported;No upper extremity supported Sitting balance-Leahy Scale: Normal       Standing balance-Leahy Scale: Fair Standing balance comment: Able to perform static standing for short periods of time however requires UE support for dynamic standing due to anxiety, weakness and balance deficits.                             Pertinent Vitals/Pain Pain Assessment: No/denies pain    Home Living Family/patient expects to be discharged to:: Skilled nursing facility Living Arrangements: Alone Available Help at Discharge: Available PRN/intermittently Type of Home: House Home Access: Stairs to enter Entrance Stairs-Rails: Right Entrance Stairs-Number of Steps: 3 Home Layout: One level Home Equipment: Walker - 2 wheels;Cane - single point Additional Comments: Reports using cane at home only recently due to falls.  Prior Function Level of Independence: Independent with assistive device(s);Needs assistance   Gait / Transfers Assistance Needed: Reports using SPC recently at home for ambulation. Reports ~7 falls recently. Has life alert.   ADL's / Homemaking Assistance Needed:  Requires assist from friend with bathing as pt reports being "scared to death to take a shower alone."  Comments:  Reports living in a cluttered environment, "looks like a hoarder house" but I am not a hoarder. Pt drives and does her grocery shopping. Does not cook.     Hand Dominance   Dominant Hand: Right    Extremity/Trunk Assessment   Upper Extremity Assessment: Overall WFL for tasks assessed           Lower Extremity Assessment: Generalized weakness;LLE deficits/detail   LLE Deficits / Details: Grossly ~3+/5 knee extension. All other joints WFL.     Communication   Communication: Expressive difficulties (some speech difficulties noted today, stating words that are nonsensical at times. )  Cognition Arousal/Alertness: Awake/alert Behavior During Therapy: Anxious Overall Cognitive Status: No family/caregiver present to determine baseline cognitive functioning Area of Impairment: Orientation;Memory;Following commands;Safety/judgement Orientation Level: Disoriented to;Time     Following Commands: Follows multi-step commands with increased time Safety/Judgement: Decreased awareness of safety     General Comments: Pt with word finding difficulties at times.     General Comments      Exercises        Assessment/Plan    PT Assessment Patient needs continued PT services  PT Diagnosis Generalized weakness   PT Problem List Decreased strength;Cardiopulmonary status limiting activity;Decreased cognition;Decreased activity tolerance;Decreased knowledge of use of DME;Decreased safety awareness;Decreased balance;Decreased mobility  PT Treatment Interventions DME instruction;Balance training;Gait training;Stair training;Patient/family education;Functional mobility training;Therapeutic activities;Therapeutic exercise;Cognitive remediation   PT Goals (Current goals can be found in the Care Plan section) Acute Rehab PT Goals Patient Stated Goal: to go home PT Goal  Formulation: With patient Time For Goal Achievement: 04/17/14 Potential to Achieve Goals: Good    Frequency Min 3X/week   Barriers to discharge Decreased caregiver support Pt lives home alone.    Co-evaluation               End of Session Equipment Utilized During Treatment: Gait belt Activity Tolerance: Patient limited by fatigue Patient left: in bed;with call bell/phone within reach;with bed alarm set Nurse Communication: Mobility status         Time: 1749-4496 PT Time Calculation (min): 36 min   Charges:   PT Evaluation $Initial PT Evaluation Tier I: 1 Procedure PT Treatments $Gait Training: 8-22 mins   PT G CodesCandy Sledge A 04/03/2014, 3:24 PM Candy Sledge, [place], DPT [phone]

## 2014-04-03 NOTE — Progress Notes (Signed)
Paged [name] in reference to language issues. Has jumbled speech with nonsensical words. She is aware of her speech problem and states it is very very new.

## 2014-04-03 NOTE — Progress Notes (Addendum)
[facility] PROGRESS NOTE Interim History: 68 y.o. 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 Patient states she did hit her head but did not lose consciousness  Assessment/Plan: Acute encephalopathy: - Telemetry showed no alarm, HR > 50, d/c telemetry. Unlikely confusion cause by bradycardia. - UDS positive for opiates, at home he is on neurontin, hydrocodone, ativan,  Zanaflex and Brintellix - He is on 2 SSRI's. - She relate that it is possible that she might have gotten confused with her medications and took some pills erroneously. Unintentional. - I have confirmed with her Psyquiatry. She is not on Symbyax or zanaflex.   Falls frequently Due to medications, consult PT.  Sinus bradycardia: - no events on telemetry, pt remains asymptomatic..  Diabetes mellitus type 2, controlled - cont SSI and metformin. - resume diet.  HYPERTENSION - cont ARB    Code Status: full Family Communication: none  Disposition Plan: inpatinet   Consultants:  none  Procedures:  Ct head  Antibiotics:  none (indicate start date, and stop date if known)  HPI/Subjective: No complains back to baseline  Objective: Filed Vitals:   04/03/14 0112 04/03/14 0430 04/03/14 0737 04/03/14 0900  BP: 166/71 167/60 156/62 150/90  Pulse: 42 42 49 50  Temp: 97.9 F (36.6 C) 97.8 F (36.6 C) 97.7 F (36.5 C) 98.6 F (37 C)  TempSrc: Oral Oral Oral Oral  Resp: 18 18 16 20   Height: 5\' 5"  (1.651 m)     Weight: 108.001 kg (238 lb 1.6 oz)     SpO2: 98% 96% 94% 94%    Intake/Output Summary (Last 24 hours) at 04/03/14 1113 Last data filed at 04/03/14 1012  Gross per 24 hour  Intake    420 ml  Output    950 ml  Net   -530 ml   Filed Weights   04/02/14 1949 04/03/14 0112  Weight:  111.585 kg (246 lb) 108.001 kg (238 lb 1.6 oz)    Exam:  General: Alert, awake, oriented x3, in no acute distress.  HEENT: No bruits, no goiter.  Heart: Regular rate and rhythm. Lungs: Good air movement, clear Abdomen: Soft, nontender, nondistended, positive bowel sounds.  Neuro: Grossly intact, nonfocal.   Data Reviewed: Basic Metabolic Panel:  Recent Labs Lab 03/28/14 1157 03/28/14 1250 03/28/14 2143 04/01/14 1422 04/02/14 2303  NA 142 139 139 140 139  K 3.6* 3.5* 3.4* 3.7 3.8  CL 105 110 104 102 101  CO2 23  --  21 25 26   GLUCOSE 153* 158* 152* 148* 198*  BUN 9 7 8 6 8   CREATININE 0.89 0.90 0.77 0.75 0.85  CALCIUM 8.5  --  9.1 9.1 9.2   Liver Function Tests:  Recent Labs Lab 03/28/14 1157 03/28/14 2143 04/02/14 2303  AST 90* 147* 116*  ALT 42* 60* 62*  ALKPHOS 119* 132* 151*  BILITOT 1.0 0.9 1.3*  PROT 5.9* 6.6 6.6  ALBUMIN 2.7* 2.9* 2.9*   No results found for this basename: LIPASE, AMYLASE,  in the last 168 hours  Recent Labs Lab 03/28/14 2222 04/02/14 2303  AMMONIA 43 34   CBC:  Recent Labs Lab 03/28/14 1157 03/28/14 1250 03/28/14 2143 04/01/14 1422 04/02/14 2303  WBC 5.1  --  5.6 5.9 5.7  NEUTROABS 3.9  --  4.0 3.9  --  HGB 12.1 12.6 12.6 12.8 12.8  HCT 36.4 37.0 37.3 37.8 38.4  MCV 94.5  --  94.7 92.4 94.1  PLT 157  --  176 209 198   Cardiac Enzymes: No results found for this basename: CKTOTAL, CKMB, CKMBINDEX, TROPONINI,  in the last 168 hours BNP (last 3 results) No results found for this basename: PROBNP,  in the last 8760 hours CBG:  Recent Labs Lab 03/28/14 2104 03/29/14 0947 04/03/14 0735  GLUCAP 119* 146* 145*    No results found for this or any previous visit (from the past 240 hour(s)).   Studies: Ct Head Wo Contrast  04/02/2014   CLINICAL DATA:  Fall, altered mental status  EXAM: CT HEAD WITHOUT CONTRAST  TECHNIQUE: Contiguous axial images were obtained from the base of the skull through the vertex without  intravenous contrast.  COMPARISON:  Prior CT from 03/28/2014  FINDINGS: Right parietal approach shunt catheter in place with tip near the septum pellucidum, unchanged. Ventricular size is stable from prior exam.  Atrophy with mild chronic small vessel ischemic changes again noted.  There is no acute intracranial hemorrhage or infarct. No mass lesion or midline shift. Gray-white matter differentiation is well maintained. CSF containing spaces are within normal limits. No extra-axial fluid collection.  The calvarium is intact.  Orbital soft tissues are within normal limits.  The paranasal sinuses and mastoid air cells are well pneumatized and free of fluid.  Scalp soft tissues are unremarkable.  IMPRESSION: 1. No acute intracranial process. 2. Stable position of right parietal approach ventricular catheter with no significant interval change in ventricular size. 3. Mild atrophy with patchy periventricular small vessel disease.   Electronically Signed   By: Jeannine Boga M.D.   On: 04/02/2014 23:20    Scheduled Meds: . darifenacin  7.5 mg Oral Daily  . enoxaparin (LOVENOX) injection  40 mg Subcutaneous Q24H  . gabapentin  300 mg Oral TID  . insulin aspart  0-9 Units Subcutaneous TID WC  . losartan  100 mg Oral Daily  . OLANZapine-FLUoxetine  2 capsule Oral QHS  . tiZANidine  4 mg Oral TID   Continuous Infusions:    Charlynne Cousins  [facility] Pager [phone]. If 8PM-8AM, please contact night-coverage at [email or web address], password [facility] 04/03/2014, 11:13 AM  LOS: 1 day      **Disclaimer: This note may have been dictated with voice recognition software. Similar sounding words can inadvertently be transcribed and this note may contain transcription errors which may not have been corrected upon publication of note.**

## 2014-04-03 NOTE — ED Notes (Signed)
IV team at bedside 

## 2014-04-03 NOTE — ED Notes (Signed)
Attempted IV access x 2. IV team paged.

## 2014-04-03 NOTE — ED Provider Notes (Signed)
Medical screening examination/treatment/procedure(s) were performed by non-physician practitioner and as supervising physician I was immediately available for consultation/collaboration.   EKG Interpretation   Date/Time:  Tuesday April 02 2014 23:05:31 EDT Ventricular Rate:  43 PR Interval:  185 QRS Duration: 106 QT Interval:  576 QTC Calculation: 487 R Axis:     Text Interpretation:  Sinus bradycardia Borderline prolonged QT interval  Confirmed by Wilson Singer  MD, Chevon Fomby ([id number]) on 04/02/2014 11:11:04 PM       Virgel Manifold, MD 04/03/14 4034

## 2014-04-04 DIAGNOSIS — I10 Essential (primary) hypertension: Secondary | ICD-10-CM

## 2014-04-04 DIAGNOSIS — T50901A Poisoning by unspecified drugs, medicaments and biological substances, accidental (unintentional), initial encounter: Secondary | ICD-10-CM | POA: Diagnosis present

## 2014-04-04 DIAGNOSIS — T50904A Poisoning by unspecified drugs, medicaments and biological substances, undetermined, initial encounter: Secondary | ICD-10-CM

## 2014-04-04 DIAGNOSIS — I517 Cardiomegaly: Secondary | ICD-10-CM

## 2014-04-04 LAB — GLUCOSE, CAPILLARY
Glucose-Capillary: 114 mg/dL — ABNORMAL HIGH (ref 70–99)
Glucose-Capillary: 150 mg/dL — ABNORMAL HIGH (ref 70–99)
Glucose-Capillary: 124 mg/dL — ABNORMAL HIGH (ref 70–99)

## 2014-04-04 NOTE — Progress Notes (Signed)
UR Completed.  [phone]  

## 2014-04-04 NOTE — Progress Notes (Addendum)
[facility] PROGRESS NOTE Interim History: 68 y.o. 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 Patient states she did hit her head but did not lose consciousness  Assessment/Plan: Acute encephalopathy: - due to accidental overdose of medications. - Telemetry showed no alarm, HR > 50, d/c telemetry. Unlikely confusion cause by bradycardia. - UDS positive for opiates, previously her UDS was positive for amphetamines. At home he is on neurontin, hydrocodone, ativan,  Zanaflex and Brintellix - She relate that it is possible that she might have gotten confused with her medications and took some pills erroneously. Unintentional. - I have confirmed with her Psyquiatry. She is not on Symbyax or zanaflex. - medically stable for d/c awaiting placement. PT reco SNF.  Falls frequently Due to medications, consult PT.  Sinus bradycardia: - no events on telemetry, pt remains asymptomatic..  Diabetes mellitus type 2, controlled - cont SSI and metformin. - resume diet.  HYPERTENSION - cont ARB    Code Status: full Family Communication: none  Disposition Plan: inpatinet   Consultants:  none  Procedures:  Ct head  Antibiotics:  none (indicate start date, and stop date if known)  HPI/Subjective: No complains back to baseline  Objective: Filed Vitals:   04/03/14 1406 04/03/14 2038 04/04/14 0022 04/04/14 0343  BP: 151/72 141/66 147/63 151/62  Pulse: 55 47 50 47  Temp: 97.3 F (36.3 C) 98.9 F (37.2 C) 98.7 F (37.1 C) 98.9 F (37.2 C)  TempSrc: Oral Oral Oral Oral  Resp: 20 18 18 18   Height:      Weight:    106.55 kg (234 lb 14.4 oz)  SpO2: 96% 94% 93% 94%    Intake/Output Summary (Last 24 hours) at 04/04/14 1019 Last data filed at 04/03/14 1708  Gross per 24  hour  Intake      0 ml  Output    100 ml  Net   -100 ml   Filed Weights   04/02/14 1949 04/03/14 0112 04/04/14 0343  Weight: 111.585 kg (246 lb) 108.001 kg (238 lb 1.6 oz) 106.55 kg (234 lb 14.4 oz)    Exam:  General: Alert, awake, oriented x3, in no acute distress.  HEENT: No bruits, no goiter.  Heart: Regular rate and rhythm. Lungs: Good air movement, clear Abdomen: Soft, nontender, nondistended, positive bowel sounds.  Neuro: Grossly intact, nonfocal.   Data Reviewed: Basic Metabolic Panel:  Recent Labs Lab 03/28/14 1157 03/28/14 1250 03/28/14 2143 04/01/14 1422 04/02/14 2303  NA 142 139 139 140 139  K 3.6* 3.5* 3.4* 3.7 3.8  CL 105 110 104 102 101  CO2 23  --  21 25 26   GLUCOSE 153* 158* 152* 148* 198*  BUN 9 7 8 6 8   CREATININE 0.89 0.90 0.77 0.75 0.85  CALCIUM 8.5  --  9.1 9.1 9.2   Liver Function Tests:  Recent Labs Lab 03/28/14 1157 03/28/14 2143 04/02/14 2303  AST 90* 147* 116*  ALT 42* 60* 62*  ALKPHOS 119* 132* 151*  BILITOT 1.0 0.9 1.3*  PROT 5.9* 6.6 6.6  ALBUMIN 2.7* 2.9* 2.9*   No results found for this basename: LIPASE, AMYLASE,  in the last 168 hours  Recent Labs Lab 03/28/14 2222 04/02/14 2303  AMMONIA 43 34   CBC:  Recent Labs Lab 03/28/14 1157 03/28/14 1250 03/28/14 2143  04/01/14 1422 04/02/14 2303  WBC 5.1  --  5.6 5.9 5.7  NEUTROABS 3.9  --  4.0 3.9  --   HGB 12.1 12.6 12.6 12.8 12.8  HCT 36.4 37.0 37.3 37.8 38.4  MCV 94.5  --  94.7 92.4 94.1  PLT 157  --  176 209 198   Cardiac Enzymes: No results found for this basename: CKTOTAL, CKMB, CKMBINDEX, TROPONINI,  in the last 168 hours BNP (last 3 results) No results found for this basename: PROBNP,  in the last 8760 hours CBG:  Recent Labs Lab 04/03/14 0735 04/03/14 1200 04/03/14 1640 04/03/14 2036 04/04/14 0746  GLUCAP 145* 146* 157* 146* 114*    No results found for this or any previous visit (from the past 240 hour(s)).   Studies: Ct Head Wo  Contrast  04/02/2014   CLINICAL DATA:  Fall, altered mental status  EXAM: CT HEAD WITHOUT CONTRAST  TECHNIQUE: Contiguous axial images were obtained from the base of the skull through the vertex without intravenous contrast.  COMPARISON:  Prior CT from 03/28/2014  FINDINGS: Right parietal approach shunt catheter in place with tip near the septum pellucidum, unchanged. Ventricular size is stable from prior exam.  Atrophy with mild chronic small vessel ischemic changes again noted.  There is no acute intracranial hemorrhage or infarct. No mass lesion or midline shift. Gray-white matter differentiation is well maintained. CSF containing spaces are within normal limits. No extra-axial fluid collection.  The calvarium is intact.  Orbital soft tissues are within normal limits.  The paranasal sinuses and mastoid air cells are well pneumatized and free of fluid.  Scalp soft tissues are unremarkable.  IMPRESSION: 1. No acute intracranial process. 2. Stable position of right parietal approach ventricular catheter with no significant interval change in ventricular size. 3. Mild atrophy with patchy periventricular small vessel disease.   Electronically Signed   By: Jeannine Boga M.D.   On: 04/02/2014 23:20    Scheduled Meds: . darifenacin  7.5 mg Oral Daily  . enoxaparin (LOVENOX) injection  40 mg Subcutaneous Q24H  . gabapentin  300 mg Oral TID  . insulin aspart  0-9 Units Subcutaneous TID WC  . losartan  100 mg Oral Daily  . metFORMIN  500 mg Oral BID WC   Continuous Infusions:    Charlynne Cousins  [facility] Pager [phone]. If 8PM-8AM, please contact night-coverage at [email or web address], password [facility] 04/04/2014, 10:19 AM  LOS: 2 days      **Disclaimer: This note may have been dictated with voice recognition software. Similar sounding words can inadvertently be transcribed and this note may contain transcription errors which may not have been corrected upon publication of note.**

## 2014-04-04 NOTE — Clinical Social Work Placement (Signed)
Clinical Social Work Department CLINICAL SOCIAL WORK PLACEMENT NOTE 04/04/2014  Patient:  [name], [name]  Account Number:  [id number] Admit date:  04/02/2014  Clinical Social Worker:  Delrae Sawyers  Date/time:  04/04/2014 04:33 PM  Clinical Social Work is seeking post-discharge placement for this patient at the following level of care:   [facility]   (*CSW will update this form in Epic as items are completed)   04/04/2014  Patient/family provided with [facility] Department of Clinical Social Work's list of facilities offering this level of care within the geographic area requested by the patient (or if unable, by the patient's family).  04/04/2014  Patient/family informed of their freedom to choose among providers that offer the needed level of care, that participate in Medicare, Medicaid or managed care program needed by the patient, have an available bed and are willing to accept the patient.  04/04/2014  Patient/family informed of [facility]' ownership interest in [facility], as well as of the fact that they are under no obligation to receive care at this facility.  PASARR submitted to EDS on 04/04/2014 PASARR number received on 04/04/2014  FL2 transmitted to all facilities in geographic area requested by pt/family on  04/04/2014 FL2 transmitted to all facilities within larger geographic area on   Patient informed that his/her managed care company has contracts with or will negotiate with  certain facilities, including the following:     Patient/family informed of bed offers received:  04/04/2014 Patient chooses bed at [facility] [place] Physician recommends and patient chooses bed at    Patient to be transferred to [facility] on  04/04/2014 Patient to be transferred to facility by [facility] Patient and family notified of transfer on 04/04/2014 Name of family member notified:  Pt at bedside. Pt states she has no family  for CSW to notify  The following physician request were entered in Epic:   Additional Comments:  Lubertha Sayres, MSW, [facility] Licensed Clinical Social Worker [phone] and [phone]

## 2014-04-04 NOTE — Progress Notes (Signed)
Physical Therapy Treatment Patient Details Name: [name] MRN: [id number] DOB: 24-Feb-1946 Today's Date: 04/04/2014    History of Present Illness Patient is a 68 y/o female with PMH of NPH s/p VP shunt placement, DM, HTN, chronic pain and depression was brought to the ER after a fall witnessed by a friend. Patient states she did hit her head but did not lose consciousness. Patient's EKG shows sinus bradycardia with heart rate around 42 bpm. Recently admitted to hospital on 8/21 and to the ER 8/31. Admitted with acute encephalopathy.    PT Comments    Pt is at risk of falling again with decreased balance and decreased safety awareness. Continue to recommend SNF post acute stay and possible ALF or home with 24 hours care after SNF. Will continue to follow pt while in acute care and work on gait, balance and strengthening. Spoke with MD following pt and discussed today's session and concerns of more falls if pt returns home alone.   Follow Up Recommendations  SNF;Supervision/Assistance - 24 hour     Equipment Recommendations  None recommended by PT    Recommendations for Other Services OT consult (may need speech as well in word finding does not improve, can be addressed at SNF)     Precautions / Restrictions Precautions Precautions: Fall Precaution Comments: Has VP shunt Restrictions Weight Bearing Restrictions: No    Mobility  Bed Mobility Overal bed mobility: Modified Independent             General bed mobility comments: HOB flat, no rails. Increased time.  Transfers Overall transfer level: Needs assistance Equipment used: Rolling walker (2 wheeled) Transfers: Sit to/from Stand Sit to Stand: Min guard         General transfer comment: VC for hand placement and technique for increased safety  Ambulation/Gait Ambulation/Gait assistance: Min guard;Min assist Ambulation Distance (Feet): 300 Feet Assistive device: Rolling walker (2 wheeled) Gait  Pattern/deviations: Step-through pattern;Decreased stride length;Trunk flexed Gait velocity: decreased Gait velocity interpretation: <1.8 ft/sec, indicative of risk for recurrent falls General Gait Details: pt able to use RW safely in open areas with no barriers. When presented with barriers (furniture, obstacles) pt needs min assist and cues for safety with gait.    Stairs            Wheelchair Mobility    Modified Rankin (Stroke Patients Only)          Cognition Arousal/Alertness: Awake/alert Behavior During Therapy: Anxious;Flat affect Overall Cognitive Status: No family/caregiver present to determine baseline cognitive functioning Area of Impairment: Safety/judgement;Problem solving;Memory;Following commands       Following Commands: Follows multi-step commands with increased time Safety/Judgement: Decreased awareness of safety   Problem Solving: Slow processing;Difficulty sequencing;Requires verbal cues;Requires tactile cues General Comments: Pt with word finding difficulties at times.            Pertinent Vitals/Pain Pain Assessment: No/denies pain           PT Goals (current goals can now be found in the care plan section) Acute Rehab PT Goals Patient Stated Goal: to go home PT Goal Formulation: With patient Time For Goal Achievement: 04/17/14 Potential to Achieve Goals: Good Progress towards PT goals: Progressing toward goals    Frequency  Min 3X/week    PT Plan Current plan remains appropriate       End of Session Equipment Utilized During Treatment: Gait belt Activity Tolerance: Patient tolerated treatment well Patient left: in chair;with call bell/phone within reach  Time: 2878-6767 PT Time Calculation (min): 24 min  Charges:  $Gait Training: 23-37 mins                    G Codes:      Willow Ora April 22, 2014, 9:45 AM  Willow Ora, PTA Office- [phone]

## 2014-04-04 NOTE — ED Provider Notes (Signed)
I saw and evaluated the patient, reviewed the resident's note and I agree with the findings and plan.   EKG Interpretation   Date/Time:  Thursday March 28 2014 11:45:17 EDT Ventricular Rate:  62 PR Interval:  184 QRS Duration: 95 QT Interval:  465 QTC Calculation: 472 R Axis:   -3 Text Interpretation:  Sinus rhythm Abnormal R-wave progression, early  transition Probable left ventricular hypertrophy Confirmed by Christy Gentles   MD, DONALD ([id number]) on 03/29/2014 6:19:54 PM        Tanna Furry, MD 04/04/14 [id number]

## 2014-04-04 NOTE — Progress Notes (Signed)
Report called to [facility] 

## 2014-04-04 NOTE — Progress Notes (Signed)
  Echocardiogram 2D Echocardiogram has been performed.  [name] 04/04/2014, 9:08 AM

## 2014-04-04 NOTE — Clinical Social Work Psychosocial (Signed)
Clinical Social Work Department BRIEF PSYCHOSOCIAL ASSESSMENT 04/04/2014  Patient:  [name], [name]     Account Number:  [id number]     Admit date:  04/02/2014  Clinical Social Worker:  Delrae Sawyers  Date/Time:  04/04/2014 04:29 PM  Referred by:  Physician  Date Referred:  04/04/2014 Referred for  SNF Placement   Other Referral:   none.   Interview type:  Patient Other interview type:   none.    PSYCHOSOCIAL DATA Living Status:  ALONE Admitted from facility:   Level of care:   Primary support name:  Jetty Peeks Primary support relationship to patient:  FRIEND Degree of support available:   Adequate support system.    CURRENT CONCERNS Current Concerns  Post-Acute Placement   Other Concerns:   none.    SOCIAL WORK ASSESSMENT / PLAN CSW met with pt at bedside to discuss SNF placement. Pt stated she is from home alone and would prefer SNF placement close to her home. CSW completed SNF search and provided pt SNF bed offers with information regarding proximity to pt's house. Pt chose [facility] CSW completed discharge packet and placed on pt's shadow chart.    Pt to be discharged via ambulance ([facility]) [phone].   Assessment/plan status:  Psychosocial Support/Ongoing Assessment of Needs Other assessment/ plan:   none.   Information/referral to community resources:   Pt to be discharged to [facility].    PATIENT'S/FAMILY'S RESPONSE TO PLAN OF CARE: Pt understanding and agreeable to CSW plan of care.       Lubertha Sayres, MSW, [facility] Licensed Clinical Social Worker [phone] and [phone]

## 2014-04-04 NOTE — Plan of Care (Signed)
Problem: Discharge Progression Outcomes Goal: Discharge plan in place and appropriate Outcome: Completed/Met Date Met:  04/04/14 Pt discharging to [facility] facility

## 2014-04-04 NOTE — Discharge Summary (Signed)
Physician Discharge Summary  [name] TIR:[id number] DOB: January 24, 1946 DOA: 04/02/2014  PCP: [name], MD  Admit date: 04/02/2014 Discharge date: 04/04/2014  Time spent:35 minutes  Recommendations for Outpatient Follow-up:  1. Follow up with Psyq in 3 weeks. 2. Follow up with PCP in 4 weeks.  Discharge Diagnoses:  Principal Problem:   Acute encephalopathy Active Problems:   HYDROCEPHALUS, NORMAL PRESSURE   HYPERTENSION   Falls frequently   Sinus bradycardia   Diabetes mellitus type 2, controlled   Medication overdose   Discharge Condition: stable  Diet recommendation: heart healthy  Filed Weights   04/02/14 1949 04/03/14 0112 04/04/14 0343  Weight: 111.585 kg (246 lb) 108.001 kg (238 lb 1.6 oz) 106.55 kg (234 lb 14.4 oz)    History of present illness:  68 y.o. 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 Patient states she did hit her head but did not lose consciousness. Patient states he was trying to reach for the phone when she slipped and fell. Denies any chest pain shortness of breath or palpitations. Patient has been having frequent falls and had come to the ER yesterday   Hospital Course:  Acute encephalopathy:  - Due to accidental overdose of medications.  - Telemetry showed no alarm, HR around 50, d/c telemetry. Unlikely confusion cause by bradycardia.  - UDS positive for opiates, previously her UDS was positive for amphetamines. At home she is on neurontin, hydrocodone, ativan and Brintellix  - She relates that it is possible that she might have gotten confused with her medications and took some pills erroneously. Unintentional.  - I have confirmed with her Psyquiatry. She is not on Symbyax or zanaflex.  -  PT rec SNF.   Falls frequently  - Due to  medications, consult PT.   Sinus bradycardia:  - No events on telemetry, pt remains asymptomatic.  Diabetes mellitus type 2, controlled  - cont SSI and metformin.  - resume diet.   HYPERTENSION  - cont ARB    Procedures:  Echo  Consultations:  none  Discharge Exam: Filed Vitals:   04/04/14 0343  BP: 151/62  Pulse: 47  Temp: 98.9 F (37.2 C)  Resp: 18    General: A&O x3 Cardiovascular: RRR Respiratory: good air movement CTA B/L  Discharge Instructions You were cared for by a hospitalist during your hospital stay. If you have any questions about your discharge medications or the care you received while you were in the hospital after you are discharged, you can call the unit and asked to speak with the hospitalist on call if the hospitalist that took care of you is not available. Once you are discharged, your primary care physician will handle any further medical issues. Please note that NO REFILLS for any discharge medications will be authorized once you are discharged, as it is imperative that you return to your primary care physician (or establish a relationship with a primary care physician if you do not have one) for your aftercare needs so that they can reassess your need for medications and monitor your lab values.  Discharge Instructions   Diet - low sodium heart healthy    Complete by:  As directed      Increase activity slowly    Complete by:  As directed  Current Discharge Medication List    CONTINUE these medications which have NOT CHANGED   Details  gabapentin (NEURONTIN) 300 MG capsule Take 300 mg by mouth 3 (three) times daily.     HYDROcodone-acetaminophen (NORCO/VICODIN) 5-325 MG per tablet Take 1 tablet by mouth every 6 (six) hours as needed for moderate pain.    LORazepam (ATIVAN) 1 MG tablet Take 1 mg by mouth 4 (four) times daily as needed. For anxiousness    losartan (COZAAR) 100 MG tablet Take 100 mg by mouth daily.    metFORMIN  (GLUCOPHAGE) 500 MG tablet Take 500 mg by mouth 2 (two) times daily with a meal.    solifenacin (VESICARE) 10 MG tablet Take 10 mg by mouth daily.    Vortioxetine HBr (BRINTELLIX) 10 MG TABS Take 10 mg by mouth every morning.       STOP taking these medications     OLANZapine-FLUoxetine (SYMBYAX) 6-25 MG per capsule      tiZANidine (ZANAFLEX) 4 MG capsule      ciprofloxacin (CILOXAN) 0.3 % ophthalmic solution        Allergies  Allergen Reactions  . Metformin And Related Diarrhea    (takes metformin at home w/ meals)  . Ace Inhibitors Other (See Comments)    unknown  . Amitriptyline Hcl Other (See Comments)    unknown  . Erythromycin Hives  . Penicillins Hives   Follow-up Information   Follow up with [name], MD In 2 weeks. (hospital follow up)    Specialty:  Internal Medicine   Contact information:   [address] [address] [phone]        The results of significant diagnostics from this hospitalization (including imaging, microbiology, ancillary and laboratory) are listed below for reference.    Significant Diagnostic Studies: Dg Skull 1-3 Views  03/29/2014   CLINICAL DATA:  68 year old female for evaluation of shunt settings. Initial encounter.  EXAM: SKULL - 1-3 VIEW  COMPARISON:  1511 hr the same day.  FINDINGS: Shunt reservoir configuration better demonstrated on these images. As noted, the shunt settings differ from those on 12/27/2013. Largely radiolucent reservoir. Tubing appears intact.  IMPRESSION: Shunt settings, better depicted than at 1511 hr today.   Electronically Signed   By: Lars Pinks M.D.   On: 03/29/2014 17:22   Dg Skull 1-3 Views  03/29/2014   CLINICAL DATA:  68 year old female with altered mental status. Evaluate shunt valve setting. Initial encounter.  EXAM: SKULL - 1-3 VIEW  COMPARISON:  Brain MRI from today reported separately. Cervical spine radiographs 12/27/2013.  FINDINGS: Right posterior convexity reservoir  with ventriculostomy shunt.  Judging from the lateral view today versus a lateral cervical spine view on 12/27/2013, the shunt setting does differ (previously the long axis of the dial appeared aligned with the radiopaque reservoir markings, now has more of and opposite configuration).  Visible shunt tubing appears intact. No acute osseous abnormality identified.  IMPRESSION: 1. The shunt reservoir setting does appear to differ from that depicted on the cervical spine radiographs 12/27/2013. 2. Visible shunt tubing intact. No acute osseous abnormality identified. .   Electronically Signed   By: Lars Pinks M.D.   On: 03/29/2014 15:27   Dg Chest 2 View  03/28/2014   CLINICAL DATA:  Altered mental status.  Mitral valve prolapse.  EXAM: CHEST  2 VIEW  COMPARISON:  03/22/2014.  FINDINGS: A poor inspiration is again demonstrated. Grossly stable enlarged cardiac silhouette and prominent pulmonary vasculature and  interstitial markings. No pleural fluid. A ventriculoperitoneal shunt catheter is again demonstrated. Diffuse osteopenia.  IMPRESSION: Stable cardiomegaly, pulmonary vascular congestion and chronic interstitial lung disease.   Electronically Signed   By: Enrique Sack M.D.   On: 03/28/2014 22:57   Dg Chest 2 View  03/22/2014   CLINICAL DATA:  Weakness.  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 There is mild cardiac enlargement. There is no pleural effusion. Pulmonary vascular congestion without edema noted. No airspace consolidation.  IMPRESSION: 1. Pulmonary vascular congestion.   Electronically Signed   By: Kerby Moors M.D.   On: 03/22/2014 16:57   Dg Lumbar Spine 2-3 Views  03/11/2014   CLINICAL DATA:  Status post fall.  Lower back pain.  EXAM: LUMBAR SPINE - 2-3 VIEW  COMPARISON:  Lumbar spine radiographs performed 03/06/2014  FINDINGS: There is no evidence of acute fracture or subluxation. There is approximately 7 mm  of stable grade 1 anterolisthesis of L4 on L5, reflecting underlying facet disease. Vertebral bodies demonstrate normal height. Intervertebral disc spaces are preserved. The visualized neural foramina are grossly unremarkable in appearance.  The visualized bowel gas pattern is unremarkable in appearance; air and stool are noted within the colon. The sacroiliac joints are within normal limits. Clips are noted within the right upper quadrant, reflecting prior cholecystectomy. A ventriculoperitoneal shunt is seen ending at the left hemipelvis.  IMPRESSION: 1. No evidence of acute fracture or subluxation along the lumbar spine. 2. Stable grade 1 anterolisthesis of L4 on L5, reflecting underlying facet disease.   Electronically Signed   By: Garald Balding M.D.   On: 03/11/2014 02:42   Dg Lumbar Spine Complete  03/06/2014   CLINICAL DATA:  Pain post trauma  EXAM: LUMBAR SPINE - COMPLETE 4+ VIEW  COMPARISON:  Lumbar MRI August 23, 2013 and CT abdomen and pelvis with bony reformats February 22, 2014  FINDINGS: Frontal, lateral, spot lumbosacral lateral, and bilateral oblique views were obtained. There is a shunt present with the shunt catheter tip in the lower left pelvis. There are 5 non-rib-bearing lumbar type vertebral bodies. There is slight anterior wedging of the T12 vertebral body which is stable compared to the recent CT examination but was not appreciable on the prior MR examination. There is no new fracture. There is 7 mm of anterolisthesis of L4 on L5 which is stable compared to prior studies. There is no new spondylolisthesis. There is disc space narrowing at T12-L1 and L1-2. There is milder disc space narrowing at L2-3, essentially stable. There is facet osteoarthritic change at L4-5 and L5-S1 bilaterally.  IMPRESSION: Mild anterior wedging of the T12 vertebral body, stable compared to recent CT but not present on prior MR. No new fracture. Stable grade I/ IV anterolisthesis of L4 on L5. No new  spondylolisthesis. Areas of osteoarthritic change, stable. 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   Electronically Signed   By: Lowella Grip M.D.   On: 03/06/2014 10:09   Dg Hip Bilateral W/pelvis  03/06/2014   CLINICAL DATA:  Fall, hip pain.  EXAM: BILATERAL HIP WITH PELVIS - 4+ VIEW  COMPARISON:  CT of the abdomen and pelvis 02/22/2014  FINDINGS: SI joints and hip joints are symmetric and unremarkable. No acute bony abnormality. Specifically, no fracture, subluxation, or dislocation. Soft tissues are intact.  The distal aspect of a ventriculoperitoneal shunt is again seen within the pelvis.  IMPRESSION:  No acute bony abnormality.   Electronically Signed   By: Rolm Baptise M.D.   On: 03/06/2014 10:06   Ct Head Wo Contrast  04/02/2014   CLINICAL DATA:  Fall, altered mental status  EXAM: CT HEAD WITHOUT CONTRAST  TECHNIQUE: Contiguous axial images were obtained from the base of the skull through the vertex without intravenous contrast.  COMPARISON:  Prior CT from 03/28/2014  FINDINGS: Right parietal approach shunt catheter in place with tip near the septum pellucidum, unchanged. Ventricular size is stable from prior exam.  Atrophy with mild chronic small vessel ischemic changes again noted.  There is no acute intracranial hemorrhage or infarct. No mass lesion or midline shift. Gray-white matter differentiation is well maintained. CSF containing spaces are within normal limits. No extra-axial fluid collection.  The calvarium is intact.  Orbital soft tissues are within normal limits.  The paranasal sinuses and mastoid air cells are well pneumatized and free of fluid.  Scalp soft tissues are unremarkable.  IMPRESSION: 1. No acute intracranial process. 2. Stable position of right parietal approach ventricular catheter with no significant interval change in ventricular size. 3. Mild atrophy with patchy periventricular small vessel disease.   Electronically Signed   By: Jeannine Boga M.D.   On: 04/02/2014 23:20   Ct Head Wo Contrast  03/28/2014   CLINICAL DATA:  Pain post trauma  EXAM: CT HEAD WITHOUT CONTRAST  TECHNIQUE: Contiguous axial images were obtained from the base of the skull through the vertex without intravenous contrast.  COMPARISON:  March 22, 2014  FINDINGS: There is moderate diffuse atrophy, stable. Shunt catheter tip is in the midline just superior to the anterior most aspect of the third ventricle, stable. There is no appreciable mass, hemorrhage, extra-axial fluid collection, or midline shift. There is patchy small vessel disease in the centra semiovale bilaterally, stable. There is no new gray-white compartment lesion. No acute infarct.  Bony calvarium is intact except for the shunt defect in the right parietal bone posteriorly. There is hyperostosis frontalis interna bilaterally. Mastoid air cells clear. There is debris in the right external auditory canal.  IMPRESSION: Mild atrophy with patchy periventricular small vessel disease. No change in a ventriculoperitoneal shunt location. No intracranial mass, hemorrhage, or extra-axial fluid. Probable cerumen in the external auditory canal on the right.   Electronically Signed   By: Lowella Grip M.D.   On: 03/28/2014 13:42   Ct Head Wo Contrast  03/22/2014   CLINICAL DATA:  Altered mental status  EXAM: CT HEAD WITHOUT CONTRAST  TECHNIQUE: Contiguous axial images were obtained from the base of the skull through the vertex without intravenous contrast.  COMPARISON:  03/11/2014  FINDINGS: No skull fracture is noted. Left VP shunt catheter is unchanged in position. No intracranial hemorrhage, mass effect or midline shift. Stable cerebral atrophy. Stable periventricular and subcortical chronic white matter disease. Ventricular size is stable from prior exam. No acute cortical infarction. No mass lesion is noted on this unenhanced scan.  IMPRESSION: No acute intracranial abnormality. Stable atrophy and chronic  white matter disease. Stable left VP shunt catheter position.   Electronically Signed   By: Lahoma Crocker M.D.   On: 03/22/2014 18:08   Ct Head Wo Contrast  03/11/2014   CLINICAL DATA:  AMS, fall  EXAM: CT HEAD WITHOUT CONTRAST  TECHNIQUE: Contiguous axial images were obtained from the base of the skull through the vertex without intravenous contrast.  COMPARISON:  Prior CT from 02/22/2014  FINDINGS: Left parietal approach VP  shunt catheter in place with tip near the septum pellucidum, unchanged. Ventricular size is stable. A single tiny hypodense within the anterior horn of the right lateral ventricle, unchanged, and of doubtful clinical significance. Atrophy with chronic small vessel ischemic changes are stable.  No acute intracranial hemorrhage or large vessel territory infarct. No mass or midline shift. No hydrocephalus. No extra-axial fluid collection.  Calvarium intact.  Scalp soft tissues within normal limits.  No acute abnormality seen about either orbit.  Paranasal sinuses and mastoid air cells are clear.  IMPRESSION: 1. No acute intracranial process. 2. Stable position of left parietal approach VP shunt catheter with tip near the septum pellucidum. Ventricular size is stable relative to most recent CT from 02/22/2014.   Electronically Signed   By: Jeannine Boga M.D.   On: 03/11/2014 02:33   Mr Brain Wo Contrast  03/29/2014   CLINICAL DATA:  Increased weakness and falls for the past 2 weeks. Increasing confusion. Altered mental status. Diabetes mellitus.  EXAM: MRI HEAD WITHOUT CONTRAST  TECHNIQUE: Multiplanar, multiecho pulse sequences of the brain and surrounding structures were obtained without intravenous contrast.  COMPARISON:  CT head 03/28/2014 Most recent. MR head 02/13/2014. Also prior CT scans from 2013.  FINDINGS: No evidence for acute infarction, hemorrhage, mass lesion, or extra-axial fluid. Moderate ventriculomegaly appears stable from most recent priors, but slightly increased from  2013. Continuous periventricular white matter signal noted on today's exam can be associated with transependymal absorption. The lateral ventricles, particularly in the body region appear significantly rounded. Correlate clinically for shunt malfunction/recurrent normal-pressure hydrocephalus as a cause of symptoms. Shunt tube appears in good position from a RIGHT occipital approach.  Foci of subcortical T2 and FLAIR hyperintensity likely representing superimposed chronic microvascular ischemic change. Flow voids are maintained. No midline abnormality. Extracranial soft tissues unremarkable.  IMPRESSION: Chronic ventriculomegaly, slightly increased from 2013. Correlate clinically for shunt malfunction. T2 and FLAIR subcortical and periventricular hyperintensities could represent a combination of small vessel disease and transependymal absorption. In the appropriate clinical setting, shunt malfunction could account for some of the imaging findings.   Electronically Signed   By: Rolla Flatten M.D.   On: 03/29/2014 13:36   Mr Thoracic Spine Wo Contrast  03/05/2014   CLINICAL DATA:  Bilateral leg numbness.  Urinary incontinence.  EXAM: MRI THORACIC AND LUMBAR SPINE WITHOUT CONTRAST  TECHNIQUE: Multiplanar and multiecho pulse sequences of the thoracic and lumbar spine were obtained without intravenous contrast.  COMPARISON:  Lumbar spine MRI 08/23/2013.  FINDINGS: MR THORACIC SPINE FINDINGS  Vertebral alignment is within normal limits. Vertebral body heights are preserved without evidence of compression fracture. Scattered degenerative marrow changes are noted throughout the thoracic spine, most prominently at T11-12. A hemangioma is noted in the L1 vertebral body. Thoracic spinal cord is normal in caliber and signal. Small left paracentral disc protrusions are present at T6-7, T7-8, and T8-9 without stenosis. At T9-10, mild disc bulging and mild-to-moderate left facet hypertrophy result in mild left neural foraminal  stenosis. Mild bilateral neural foraminal stenosis is present at T10-11 due to facet hypertrophy. Paraspinal soft tissues are unremarkable.  MR LUMBAR SPINE FINDINGS  Grade 1 anterolisthesis of L4 on L5 is unchanged. There is no lumbar compression fracture. Prominent dorsal epidural fat is again seen throughout the lumbar spine. There is also mild, diffuse narrowing of the lumbar spinal canal due to congenitally short pedicles. Mild multilevel disc desiccation is again seen, most prominently at L3-4 and L4-5. L1 vertebral body hemangioma is noted.  Conus medullaris is normal in signal and terminates at L1. 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 T2 hyperintense lesion in the lower pole the right kidney is unchanged and most likely represents a cyst.  L1-2:  Minimal disc bulge without stenosis.  L2-3: Minimal disc bulging, congenitally short pedicles, prominent dorsal epidural fat, and mild-to-moderate facet and ligamentum flavum hypertrophy without stenosis.  L3-4: Minimal disc bulging, congenitally short pedicles, prominent dorsal epidural fat, and moderate facet and ligamentum flavum hypertrophy result in mild narrowing of the thecal sac, unchanged. No neural foraminal stenosis.  L4-5: Listhesis with uncovering of the disc, congenitally short pedicles, and severe facet hypertrophy result in severe spinal stenosis and minimal right neural foraminal narrowing, not significantly changed.  L5-S1: Moderate to severe facet arthrosis without significant spinal canal or neural foraminal stenosis, unchanged.  IMPRESSION: MR THORACIC SPINE IMPRESSION  1. Mild thoracic spondylosis without spinal stenosis or cord compression. 2. Mild neural foraminal narrowing on the left at T9-10 and bilaterally at T10-11 due to facet arthrosis.  MR LUMBAR SPINE IMPRESSION  Unchanged appearance of lumbar disc degeneration and facet arthrosis, most pronounced at L4-5 where there is severe spinal stenosis.   Electronically Signed   By: Logan Bores    On: 03/05/2014 21:09   Mr Lumbar Spine Wo Contrast  03/05/2014   CLINICAL DATA:  Bilateral leg numbness.  Urinary incontinence.  EXAM: MRI THORACIC AND LUMBAR SPINE WITHOUT CONTRAST  TECHNIQUE: Multiplanar and multiecho pulse sequences of the thoracic and lumbar spine were obtained without intravenous contrast.  COMPARISON:  Lumbar spine MRI 08/23/2013.  FINDINGS: MR THORACIC SPINE FINDINGS  Vertebral alignment is within normal limits. Vertebral body heights are preserved without evidence of compression fracture. Scattered degenerative marrow changes are noted throughout the thoracic spine, most prominently at T11-12. A hemangioma is noted in the L1 vertebral body. Thoracic spinal cord is normal in caliber and signal. Small left paracentral disc protrusions are present at T6-7, T7-8, and T8-9 without stenosis. At T9-10, mild disc bulging and mild-to-moderate left facet hypertrophy result in mild left neural foraminal stenosis. Mild bilateral neural foraminal stenosis is present at T10-11 due to facet hypertrophy. Paraspinal soft tissues are unremarkable.  MR LUMBAR SPINE FINDINGS  Grade 1 anterolisthesis of L4 on L5 is unchanged. There is no lumbar compression fracture. Prominent dorsal epidural fat is again seen throughout the lumbar spine. There is also mild, diffuse narrowing of the lumbar spinal canal due to congenitally short pedicles. Mild multilevel disc desiccation is again seen, most prominently at L3-4 and L4-5. L1 vertebral body hemangioma is noted. Conus medullaris is normal in signal and terminates at L1. 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 T2 hyperintense lesion in the lower pole the right kidney is unchanged and most likely represents a cyst.  L1-2:  Minimal disc bulge without stenosis.  L2-3: Minimal disc bulging, congenitally short pedicles, prominent dorsal epidural fat, and mild-to-moderate facet and ligamentum flavum hypertrophy without stenosis.  L3-4: Minimal disc bulging, congenitally short  pedicles, prominent dorsal epidural fat, and moderate facet and ligamentum flavum hypertrophy result in mild narrowing of the thecal sac, unchanged. No neural foraminal stenosis.  L4-5: Listhesis with uncovering of the disc, congenitally short pedicles, and severe facet hypertrophy result in severe spinal stenosis and minimal right neural foraminal narrowing, not significantly changed.  L5-S1: Moderate to severe facet arthrosis without significant spinal canal or neural foraminal stenosis, unchanged.  IMPRESSION: MR THORACIC SPINE IMPRESSION  1. Mild thoracic spondylosis without spinal stenosis or cord compression. 2.  Mild neural foraminal narrowing on the left at T9-10 and bilaterally at T10-11 due to facet arthrosis.  MR LUMBAR SPINE IMPRESSION  Unchanged appearance of lumbar disc degeneration and facet arthrosis, most pronounced at L4-5 where there is severe spinal stenosis.   Electronically Signed   By: Logan Bores   On: 03/05/2014 21:09   US Abdomen Complete  03/23/2014   CLINICAL DATA:  Nausea and vomiting and elevated hepatic function studies.  EXAM: ULTRASOUND ABDOMEN COMPLETE  COMPARISON:  Lumbar spine series of March 11, 2014 and abdominal and pelvic CT scan of February 22, 2014.  FINDINGS: Gallbladder:  Surgically absent  Common bile duct:  Diameter: 4 mm  Liver:  The liver exhibits increased echotexture which is somewhat heterogeneous. There is no focal mass or ductal dilation.  IVC:  Portions are obscured by bowel gas.  Pancreas:  The pancreas exhibits no focal mass nor ductal dilation.  Spleen:  There is mild prominence of the spleen with overall volume of 419 cc and maximal dimension of 13.5 cm. The spleen is been noted to be prominent in the past.  Right Kidney:  Length: 12.4 cm. Echogenicity within normal limits. No mass or hydronephrosis visualized.  Left Kidney:  Length: 13.2 cm. Echogenicity within normal limits. No mass or hydronephrosis visualized.  Abdominal aorta:  The lower aorta and  bifurcation or not demonstrated. No aneurysm is visible.  Other findings:  None.  IMPRESSION: 1. The echotexture of the liver is increased and heterogeneous. No discrete mass or ductal dilation are demonstrated. 2. There is no evidence of acute pancreatitis nor intrapancreatic or common bile duct dilation. 3. There is mild prominence of the spleen which is stable.   Electronically Signed   By: David  Martinique   On: 03/23/2014 11:05   Dg Knee Complete 4 Views Left  03/05/2014   CLINICAL DATA:  Fall, knee numbness, predominately laterally.  EXAM: LEFT KNEE - COMPLETE 4+ VIEW  COMPARISON:  None.  FINDINGS: No acute fracture deformity or dislocation. Tricompartmental osteoarthrosis, severe within the patellofemoral, moderate to severe within the medial lateral compartments. No destructive bony lesions. Loose body versus irregular fabella within the popliteal fossa. Soft tissue planes are nonsuspicious.  IMPRESSION: No acute fracture deformity nor dislocation.  Tricompartmental osteoarthrosis, severe within the patellofemoral compartment.   Electronically Signed   By: Elon Alas   On: 03/05/2014 18:46    Microbiology: No results found for this or any previous visit (from the past 240 hour(s)).   Labs: Basic Metabolic Panel:  Recent Labs Lab 03/28/14 2143 04/01/14 1422 04/02/14 2303  NA 139 140 139  K 3.4* 3.7 3.8  CL 104 102 101  CO2 21 25 26   GLUCOSE 152* 148* 198*  BUN 8 6 8   CREATININE 0.77 0.75 0.85  CALCIUM 9.1 9.1 9.2   Liver Function Tests:  Recent Labs Lab 03/28/14 2143 04/02/14 2303  AST 147* 116*  ALT 60* 62*  ALKPHOS 132* 151*  BILITOT 0.9 1.3*  PROT 6.6 6.6  ALBUMIN 2.9* 2.9*   No results found for this basename: LIPASE, AMYLASE,  in the last 168 hours  Recent Labs Lab 03/28/14 2222 04/02/14 2303  AMMONIA 43 34   CBC:  Recent Labs Lab 03/28/14 2143 04/01/14 1422 04/02/14 2303  WBC 5.6 5.9 5.7  NEUTROABS 4.0 3.9  --   HGB 12.6 12.8 12.8  HCT 37.3  37.8 38.4  MCV 94.7 92.4 94.1  PLT 176 209 198   Cardiac Enzymes: No results found  for this basename: CKTOTAL, CKMB, CKMBINDEX, TROPONINI,  in the last 168 hours BNP: BNP (last 3 results) No results found for this basename: PROBNP,  in the last 8760 hours CBG:  Recent Labs Lab 04/03/14 1200 04/03/14 1640 04/03/14 2036 04/04/14 0746 04/04/14 1202  GLUCAP 146* 157* 146* 114* 150*       Signed:  FELIZ ORTIZ, ABRAHAM  [facility] 04/04/2014, 2:31 PM

## 2014-04-04 NOTE — Progress Notes (Signed)
Patient is currently referred to [facility] for chronic disease management services.  We were unable to reached her at home due to her multiple readmissions.  She maintains that she does want to have services at home when she returns.  She will be discharged to [facility] for short term rehab prior to going home.  Patient will receive a post discharge transition of care call and will be evaluated for monthly home visits for assessments and disease process education.  Made Inpatient Case Manager aware that [facility] following. Of note, [facility] Management services does not replace or interfere with any services that are arranged by inpatient case management or social work.  For additional questions or referrals please contact Corliss Blacker BSN RN [facility]iaison at [phone].

## 2014-04-05 ENCOUNTER — Other Ambulatory Visit: Payer: Self-pay | Admitting: *Deleted

## 2014-04-05 ENCOUNTER — Encounter: Payer: Self-pay | Admitting: Internal Medicine

## 2014-04-05 ENCOUNTER — Non-Acute Institutional Stay (SKILLED_NURSING_FACILITY): Payer: PRIVATE HEALTH INSURANCE | Admitting: Internal Medicine

## 2014-04-05 DIAGNOSIS — R531 Weakness: Secondary | ICD-10-CM

## 2014-04-05 DIAGNOSIS — R112 Nausea with vomiting, unspecified: Secondary | ICD-10-CM

## 2014-04-05 DIAGNOSIS — N3281 Overactive bladder: Secondary | ICD-10-CM

## 2014-04-05 DIAGNOSIS — M48061 Spinal stenosis, lumbar region without neurogenic claudication: Secondary | ICD-10-CM

## 2014-04-05 DIAGNOSIS — R259 Unspecified abnormal involuntary movements: Secondary | ICD-10-CM

## 2014-04-05 DIAGNOSIS — R251 Tremor, unspecified: Secondary | ICD-10-CM

## 2014-04-05 DIAGNOSIS — R5383 Other fatigue: Secondary | ICD-10-CM

## 2014-04-05 DIAGNOSIS — N318 Other neuromuscular dysfunction of bladder: Secondary | ICD-10-CM

## 2014-04-05 DIAGNOSIS — R5381 Other malaise: Secondary | ICD-10-CM

## 2014-04-05 DIAGNOSIS — I10 Essential (primary) hypertension: Secondary | ICD-10-CM

## 2014-04-05 DIAGNOSIS — E119 Type 2 diabetes mellitus without complications: Secondary | ICD-10-CM

## 2014-04-05 MED ORDER — HYDROCODONE-ACETAMINOPHEN 5-325 MG PO TABS: ORAL_TABLET | ORAL | Status: DC

## 2014-04-05 MED ORDER — LORAZEPAM 1 MG PO TABS: 1.0000 mg | ORAL_TABLET | Freq: Four times a day (QID) | ORAL | Status: DC | PRN

## 2014-04-05 NOTE — Progress Notes (Signed)
Patient ID: [name], female   DOB: [birth year], 68 y.o.   MRN: [id number]     Facility: [facility]   PCP: [name], MD  Code Status: full code  Allergies  Allergen Reactions  . Metformin And Related Diarrhea    (takes metformin at home w/ meals)  . Ace Inhibitors Other (See Comments)    unknown  . Amitriptyline Hcl Other (See Comments)    unknown  . Erythromycin Hives  . Penicillins Hives    Chief Complaint: new admit  HPI:  68 y/o female patient is here for STR after hospital admission from 9/115-04/04/14 with acte encephalopathy in the setting of possible drug overdose. She was noted to be bradycardic and UDS was positive for opiates. Once alert and awake, with her weakness and frequent falls, she was sent to SNF for rehabilitation. She is seen in her room with DON. She has hx of dm, anxiety, HTN, depression and lumbar spinal stenosis. She appears anxious and her face is flushed. She has had 2 episodes of vomiting and is nauseous. She complaints of abdominal discomfort but denies pain or cramps. No loose stools/ diarrhea. No fever or chills  Review of Systems:  Constitutional: has malaise/fatigue  HENT: Negative for congestion Eyes: Negative for eye pain, blurred vision Respiratory: Negative for cough, shortness of breath and wheezing.   Cardiovascular: Negative for chest pain, leg swelling.  Gastrointestinal: Negative for heartburn, diarrhea and constipation.  Genitourinary: Negative for dysuria Musculoskeletal: has back pain Skin: Negative for itching and rash.  Neurological: Negative for dizziness, headaches.  Psychiatric/Behavioral: has depression and anxiety   Past Medical History  Diagnosis Date  . Depression   . Anxiety   . Hypertension   . MVP (mitral valve prolapse)   . Hydrocephalus   . Diabetes mellitus without complication   . Sinus bradycardia   . Dizziness   . Falls   . Acute kidney injury   . Transaminitis   .  Hypotension   . Acid reflux disease    Past Surgical History  Procedure Laterality Date  . Cholecystectomy    . Brain surgery    . Ercp N/A 02/28/2014    Procedure: ENDOSCOPIC RETROGRADE CHOLANGIOPANCREATOGRAPHY (ERCP);  Surgeon: Gatha Mayer, MD;  Location: [facility] ENDOSCOPY;  Service: Endoscopy;  Laterality: N/A;  talked to tiffany from or /ebp  . Esophagogastroduodenoscopy N/A 02/28/2014    Procedure: ESOPHAGOGASTRODUODENOSCOPY (EGD);  Surgeon: Gatha Mayer, MD;  Location: [facility] ENDOSCOPY;  Service: Endoscopy;  Laterality: N/A;  . Ventriculoperitoneal shunt      right occipital   Social History:   reports that she has never smoked. She has never used smokeless tobacco. She reports that she does not drink alcohol or use illicit drugs.  Family History  Problem Relation Age of Onset  . Heart disease Mother   . Stroke Mother   . Alcohol abuse Father   . Diabetes Father   . Cancer Neg Hx   . Hypertension Mother     Medications: Patient's Medications  New Prescriptions   No medications on file  Previous Medications   GABAPENTIN (NEURONTIN) 300 MG CAPSULE    Take 300 mg by mouth 3 (three) times daily.    HYDROCODONE-ACETAMINOPHEN (NORCO/VICODIN) 5-325 MG PER TABLET    Take 1 tablet by mouth every 6 (six) hours as needed for moderate pain.   LORAZEPAM (ATIVAN) 1 MG TABLET    Take 1 mg by mouth every 6 (six) hours as needed. For  anxiousness   LOSARTAN (COZAAR) 100 MG TABLET    Take 100 mg by mouth daily.   METFORMIN (GLUCOPHAGE) 500 MG TABLET    Take 500 mg by mouth 2 (two) times daily with a meal.   SOLIFENACIN (VESICARE) 10 MG TABLET    Take 10 mg by mouth daily.   VORTIOXETINE HBR (BRINTELLIX) 10 MG TABS    Take 10 mg by mouth every morning.   Modified Medications   No medications on file  Discontinued Medications   No medications on file     Physical Exam: Filed Vitals:   04/05/14 1144  BP: 140/76  Pulse: 76  Temp: 98.1 F (36.7 C)  Resp: 20   General- elderly female in  no acute distress Head- atraumatic, normocephalic Eyes- dilated pupil reactive to light. no pallor, no icterus, no discharge Neck- no lymphadenopathy Throat- moist mucus membrane Nose- no nasal drainage Cardiovascular- normal s1,s2, no murmurs Respiratory- bilateral clear to auscultation, no wheeze, no rhonchi, no crackles, no use of accessory muscles Abdomen- bowel sounds present, soft, non tender Musculoskeletal- able to move all 4 extremities, no leg edema, tremulous Neurological- no focal deficit Skin- warm and dry Psychiatry- alert and oriented, anxious, tearful   Labs reviewed: Basic Metabolic Panel:  Recent Labs  02/22/14 1320 02/22/14 1820  02/28/14 0552  03/28/14 2143 04/01/14 1422 04/02/14 2303  NA 139  --   < > 146  < > 139 140 139  K 4.0  --   < > 3.8  < > 3.4* 3.7 3.8  CL 99  --   < > 108  < > 104 102 101  CO2 21  --   < > 24  < > 21 25 26   GLUCOSE 201*  --   < > 112*  < > 152* 148* 198*  BUN 23  --   < > 6  < > 8 6 8   CREATININE 1.55*  --   < > 0.83  < > 0.77 0.75 0.85  CALCIUM 8.6  --   < > 8.7  < > 9.1 9.1 9.2  MG  --  1.9  --  1.7  --   --   --   --   < > = values in this interval not displayed. Liver Function Tests:  Recent Labs  03/28/14 1157 03/28/14 2143 04/02/14 2303  AST 90* 147* 116*  ALT 42* 60* 62*  ALKPHOS 119* 132* 151*  BILITOT 1.0 0.9 1.3*  PROT 5.9* 6.6 6.6  ALBUMIN 2.7* 2.9* 2.9*    Recent Labs  02/22/14 1320 03/01/14 0550  LIPASE 19 42  AMYLASE  --  45    Recent Labs  02/13/14 1624 03/28/14 2222 04/02/14 2303  AMMONIA 28 43 34   CBC:  Recent Labs  03/28/14 1157  03/28/14 2143 04/01/14 1422 04/02/14 2303  WBC 5.1  --  5.6 5.9 5.7  NEUTROABS 3.9  --  4.0 3.9  --   HGB 12.1  < > 12.6 12.8 12.8  HCT 36.4  < > 37.3 37.8 38.4  MCV 94.5  --  94.7 92.4 94.1  PLT 157  --  176 209 198  < > = values in this interval not displayed. Cardiac Enzymes:  Recent Labs  02/13/14 0717 02/22/14 2000 03/11/14 0311    CKTOTAL  --  243*  --   TROPONINI <0.30  --  <0.30   BNP: No components found with this basename: POCBNP,  CBG:  Recent Labs  04/04/14 0746 04/04/14 1202 04/04/14 1652  GLUCAP 114* 150* 124*   Lab Results  Component Value Date   HGBA1C 6.6* 02/13/2014    Radiological Exams: Dg Skull 1-3 Views  03/29/2014   CLINICAL DATA:  68 year old female for evaluation of shunt settings. Initial encounter.  EXAM: SKULL - 1-3 VIEW  COMPARISON:  1511 hr the same day.  FINDINGS: Shunt reservoir configuration better demonstrated on these images. As noted, the shunt settings differ from those on 12/27/2013. Largely radiolucent reservoir. Tubing appears intact.  IMPRESSION: Shunt settings, better depicted than at 1511 hr today.   Electronically Signed   By: Lars Pinks M.D.   On: 03/29/2014 17:22   Dg Skull 1-3 Views  03/29/2014   CLINICAL DATA:  68 year old female with altered mental status. Evaluate shunt valve setting. Initial encounter.  EXAM: SKULL - 1-3 VIEW  COMPARISON:  Brain MRI from today reported separately. Cervical spine radiographs 12/27/2013.  FINDINGS: Right posterior convexity reservoir with ventriculostomy shunt.  Judging from the lateral view today versus a lateral cervical spine view on 12/27/2013, the shunt setting does differ (previously the long axis of the dial appeared aligned with the radiopaque reservoir markings, now has more of and opposite configuration).  Visible shunt tubing appears intact. No acute osseous abnormality identified.  IMPRESSION: 1. The shunt reservoir setting does appear to differ from that depicted on the cervical spine radiographs 12/27/2013. 2. Visible shunt tubing intact. No acute osseous abnormality identified. .   Electronically Signed   By: Lars Pinks M.D.   On: 03/29/2014 15:27   Dg Chest 2 View  03/28/2014   CLINICAL DATA:  Altered mental status.  Mitral valve prolapse.  EXAM: CHEST  2 VIEW  COMPARISON:  03/22/2014.  FINDINGS: A poor inspiration is again  demonstrated. Grossly stable enlarged cardiac silhouette and prominent pulmonary vasculature and interstitial markings. No pleural fluid. A ventriculoperitoneal shunt catheter is again demonstrated. Diffuse osteopenia.  IMPRESSION: Stable cardiomegaly, pulmonary vascular congestion and chronic interstitial lung disease.   Electronically Signed   By: Enrique Sack M.D.   On: 03/28/2014 22:57   Dg Chest 2 View  03/22/2014   CLINICAL DATA:  Weakness.  Fall  EXAM: CHEST  2 VIEW  COMPARISON: 02/22/2014  FINDINGS: Right-sided ventriculoperitoneal shunt catheter is again noted. There is mild cardiac enlargement. There is no pleural effusion. Pulmonary vascular congestion without edema noted. No airspace consolidation.  IMPRESSION: 1. Pulmonary vascular congestion.   Electronically Signed   By: Kerby Moors M.D.   On: 03/22/2014 16:57   Dg Lumbar Spine 2-3 Views  03/11/2014   CLINICAL DATA:  Status post fall.  Lower back pain.  EXAM: LUMBAR SPINE - 2-3 VIEW  COMPARISON:  Lumbar spine radiographs performed 03/06/2014  FINDINGS: There is no evidence of acute fracture or subluxation. There is approximately 7 mm of stable grade 1 anterolisthesis of L4 on L5, reflecting underlying facet disease. Vertebral bodies demonstrate normal height. Intervertebral disc spaces are preserved. The visualized neural foramina are grossly unremarkable in appearance.  The visualized bowel gas pattern is unremarkable in appearance; air and stool are noted within the colon. The sacroiliac joints are within normal limits. Clips are noted within the right upper quadrant, reflecting prior cholecystectomy. A ventriculoperitoneal shunt is seen ending at the left hemipelvis.  IMPRESSION: 1. No evidence of acute fracture or subluxation along the lumbar spine. 2. Stable grade 1 anterolisthesis of L4 on L5, reflecting underlying facet disease.   Electronically Signed   By: Jacqulynn Cadet  Chang M.D.   On: 03/11/2014 02:42   Dg Lumbar Spine  Complete  03/06/2014   CLINICAL DATA:  Pain post trauma  EXAM: LUMBAR SPINE - COMPLETE 4+ VIEW  COMPARISON:  Lumbar MRI August 23, 2013 and CT abdomen and pelvis with bony reformats February 22, 2014  FINDINGS: Frontal, lateral, spot lumbosacral lateral, and bilateral oblique views were obtained. There is a shunt present with the shunt catheter tip in the lower left pelvis. There are 5 non-rib-bearing lumbar type vertebral bodies. There is slight anterior wedging of the T12 vertebral body which is stable compared to the recent CT examination but was not appreciable on the prior MR examination. There is no new fracture. There is 7 mm of anterolisthesis of L4 on L5 which is stable compared to prior studies. There is no new spondylolisthesis. There is disc space narrowing at T12-L1 and L1-2. There is milder disc space narrowing at L2-3, essentially stable. There is facet osteoarthritic change at L4-5 and L5-S1 bilaterally.  IMPRESSION: Mild anterior wedging of the T12 vertebral body, stable compared to recent CT but not present on prior MR. No new fracture. Stable grade I/ IV anterolisthesis of L4 on L5. No new spondylolisthesis. Areas of osteoarthritic change, stable. 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   Electronically Signed   By: Lowella Grip M.D.   On: 03/06/2014 10:09   Dg Hip Bilateral W/pelvis  03/06/2014   CLINICAL DATA:  Fall, hip pain.  EXAM: BILATERAL HIP WITH PELVIS - 4+ VIEW  COMPARISON:  CT of the abdomen and pelvis 02/22/2014  FINDINGS: SI joints and hip joints are symmetric and unremarkable. No acute bony abnormality. Specifically, no fracture, subluxation, or dislocation. Soft tissues are intact.  The distal aspect of a ventriculoperitoneal shunt is again seen within the pelvis.  IMPRESSION: No acute bony abnormality.   Electronically Signed   By: Rolm Baptise M.D.   On: 03/06/2014 10:06   Ct Head Wo Contrast  04/02/2014   CLINICAL DATA:  Fall, altered mental  status  EXAM: CT HEAD WITHOUT CONTRAST  TECHNIQUE: Contiguous axial images were obtained from the base of the skull through the vertex without intravenous contrast.  COMPARISON:  Prior CT from 03/28/2014  FINDINGS: Right parietal approach shunt catheter in place with tip near the septum pellucidum, unchanged. Ventricular size is stable from prior exam.  Atrophy with mild chronic small vessel ischemic changes again noted.  There is no acute intracranial hemorrhage or infarct. No mass lesion or midline shift. Gray-white matter differentiation is well maintained. CSF containing spaces are within normal limits. No extra-axial fluid collection.  The calvarium is intact.  Orbital soft tissues are within normal limits.  The paranasal sinuses and mastoid air cells are well pneumatized and free of fluid.  Scalp soft tissues are unremarkable.  IMPRESSION: 1. No acute intracranial process. 2. Stable position of right parietal approach ventricular catheter with no significant interval change in ventricular size. 3. Mild atrophy with patchy periventricular small vessel disease.   Electronically Signed   By: Jeannine Boga M.D.   On: 04/02/2014 23:20   Ct Head Wo Contrast  03/28/2014   CLINICAL DATA:  Pain post trauma  EXAM: CT HEAD WITHOUT CONTRAST  TECHNIQUE: Contiguous axial images were obtained from the base of the skull through the vertex without intravenous contrast.  COMPARISON:  March 22, 2014  FINDINGS: There is moderate diffuse atrophy, stable. Shunt catheter tip is in the midline just superior to the  anterior most aspect of the third ventricle, stable. There is no appreciable mass, hemorrhage, extra-axial fluid collection, or midline shift. There is patchy small vessel disease in the centra semiovale bilaterally, stable. There is no new gray-white compartment lesion. No acute infarct.  Bony calvarium is intact except for the shunt defect in the right parietal bone posteriorly. There is hyperostosis frontalis  interna bilaterally. Mastoid air cells clear. There is debris in the right external auditory canal.  IMPRESSION: Mild atrophy with patchy periventricular small vessel disease. No change in a ventriculoperitoneal shunt location. No intracranial mass, hemorrhage, or extra-axial fluid. Probable cerumen in the external auditory canal on the right.   Electronically Signed   By: Lowella Grip M.D.   On: 03/28/2014 13:42   Ct Head Wo Contrast  03/22/2014   CLINICAL DATA:  Altered mental status  EXAM: CT HEAD WITHOUT CONTRAST  TECHNIQUE: Contiguous axial images were obtained from the base of the skull through the vertex without intravenous contrast.  COMPARISON:  03/11/2014  FINDINGS: No skull fracture is noted. Left VP shunt catheter is unchanged in position. No intracranial hemorrhage, mass effect or midline shift. Stable cerebral atrophy. Stable periventricular and subcortical chronic white matter disease. Ventricular size is stable from prior exam. No acute cortical infarction. No mass lesion is noted on this unenhanced scan.  IMPRESSION: No acute intracranial abnormality. Stable atrophy and chronic white matter disease. Stable left VP shunt catheter position.   Electronically Signed   By: Lahoma Crocker M.D.   On: 03/22/2014 18:08   Ct Head Wo Contrast  03/11/2014   CLINICAL DATA:  AMS, fall  EXAM: CT HEAD WITHOUT CONTRAST  TECHNIQUE: Contiguous axial images were obtained from the base of the skull through the vertex without intravenous contrast.  COMPARISON:  Prior CT from 02/22/2014  FINDINGS: Left parietal approach VP shunt catheter in place with tip near the septum pellucidum, unchanged. Ventricular size is stable. A single tiny hypodense within the anterior horn of the right lateral ventricle, unchanged, and of doubtful clinical significance. Atrophy with chronic small vessel ischemic changes are stable.  No acute intracranial hemorrhage or large vessel territory infarct. No mass or midline shift. No  hydrocephalus. No extra-axial fluid collection.  Calvarium intact.  Scalp soft tissues within normal limits.  No acute abnormality seen about either orbit.  Paranasal sinuses and mastoid air cells are clear.  IMPRESSION: 1. No acute intracranial process. 2. Stable position of left parietal approach VP shunt catheter with tip near the septum pellucidum. Ventricular size is stable relative to most recent CT from 02/22/2014.   Electronically Signed   By: Jeannine Boga M.D.   On: 03/11/2014 02:33   Mr Brain Wo Contrast  03/29/2014   CLINICAL DATA:  Increased weakness and falls for the past 2 weeks. Increasing confusion. Altered mental status. Diabetes mellitus.  EXAM: MRI HEAD WITHOUT CONTRAST  TECHNIQUE: Multiplanar, multiecho pulse sequences of the brain and surrounding structures were obtained without intravenous contrast.  COMPARISON:  CT head 03/28/2014 Most recent. MR head 02/13/2014. Also prior CT scans from 2013.  FINDINGS: No evidence for acute infarction, hemorrhage, mass lesion, or extra-axial fluid. Moderate ventriculomegaly appears stable from most recent priors, but slightly increased from 2013. Continuous periventricular white matter signal noted on today's exam can be associated with transependymal absorption. The lateral ventricles, particularly in the body region appear significantly rounded. Correlate clinically for shunt malfunction/recurrent normal-pressure hydrocephalus as a cause of symptoms. Shunt tube appears in good position from a RIGHT occipital  approach.  Foci of subcortical T2 and FLAIR hyperintensity likely representing superimposed chronic microvascular ischemic change. Flow voids are maintained. No midline abnormality. Extracranial soft tissues unremarkable.  IMPRESSION: Chronic ventriculomegaly, slightly increased from 2013. Correlate clinically for shunt malfunction. T2 and FLAIR subcortical and periventricular hyperintensities could represent a combination of small vessel  disease and transependymal absorption. In the appropriate clinical setting, shunt malfunction could account for some of the imaging findings.   Electronically Signed   By: Rolla Flatten M.D.   On: 03/29/2014 13:36   Mr Thoracic Spine Wo Contrast  03/05/2014   CLINICAL DATA:  Bilateral leg numbness.  Urinary incontinence.  EXAM: MRI THORACIC AND LUMBAR SPINE WITHOUT CONTRAST  TECHNIQUE: Multiplanar and multiecho pulse sequences of the thoracic and lumbar spine were obtained without intravenous contrast.  COMPARISON:  Lumbar spine MRI 08/23/2013.  FINDINGS: MR THORACIC SPINE FINDINGS  Vertebral alignment is within normal limits. Vertebral body heights are preserved without evidence of compression fracture. Scattered degenerative marrow changes are noted throughout the thoracic spine, most prominently at T11-12. A hemangioma is noted in the L1 vertebral body. Thoracic spinal cord is normal in caliber and signal. Small left paracentral disc protrusions are present at T6-7, T7-8, and T8-9 without stenosis. At T9-10, mild disc bulging and mild-to-moderate left facet hypertrophy result in mild left neural foraminal stenosis. Mild bilateral neural foraminal stenosis is present at T10-11 due to facet hypertrophy. Paraspinal soft tissues are unremarkable.  MR LUMBAR SPINE FINDINGS  Grade 1 anterolisthesis of L4 on L5 is unchanged. There is no lumbar compression fracture. Prominent dorsal epidural fat is again seen throughout the lumbar spine. There is also mild, diffuse narrowing of the lumbar spinal canal due to congenitally short pedicles. Mild multilevel disc desiccation is again seen, most prominently at L3-4 and L4-5. L1 vertebral body hemangioma is noted. Conus medullaris is normal in signal and terminates at L1. 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 T2 hyperintense lesion in the lower pole the right kidney is unchanged and most likely represents a cyst.  L1-2:  Minimal disc bulge without stenosis.  L2-3: Minimal disc  bulging, congenitally short pedicles, prominent dorsal epidural fat, and mild-to-moderate facet and ligamentum flavum hypertrophy without stenosis.  L3-4: Minimal disc bulging, congenitally short pedicles, prominent dorsal epidural fat, and moderate facet and ligamentum flavum hypertrophy result in mild narrowing of the thecal sac, unchanged. No neural foraminal stenosis.  L4-5: Listhesis with uncovering of the disc, congenitally short pedicles, and severe facet hypertrophy result in severe spinal stenosis and minimal right neural foraminal narrowing, not significantly changed.  L5-S1: Moderate to severe facet arthrosis without significant spinal canal or neural foraminal stenosis, unchanged.  IMPRESSION: MR THORACIC SPINE IMPRESSION  1. Mild thoracic spondylosis without spinal stenosis or cord compression. 2. Mild neural foraminal narrowing on the left at T9-10 and bilaterally at T10-11 due to facet arthrosis.  MR LUMBAR SPINE IMPRESSION  Unchanged appearance of lumbar disc degeneration and facet arthrosis, most pronounced at L4-5 where there is severe spinal stenosis.   Electronically Signed   By: Logan Bores   On: 03/05/2014 21:09   Mr Lumbar Spine Wo Contrast  03/05/2014   CLINICAL DATA:  Bilateral leg numbness.  Urinary incontinence.  EXAM: MRI THORACIC AND LUMBAR SPINE WITHOUT CONTRAST  TECHNIQUE: Multiplanar and multiecho pulse sequences of the thoracic and lumbar spine were obtained without intravenous contrast.  COMPARISON:  Lumbar spine MRI 08/23/2013.  FINDINGS: MR THORACIC SPINE FINDINGS  Vertebral alignment is within normal limits. Vertebral body  heights are preserved without evidence of compression fracture. Scattered degenerative marrow changes are noted throughout the thoracic spine, most prominently at T11-12. A hemangioma is noted in the L1 vertebral body. Thoracic spinal cord is normal in caliber and signal. Small left paracentral disc protrusions are present at T6-7, T7-8, and T8-9 without  stenosis. At T9-10, mild disc bulging and mild-to-moderate left facet hypertrophy result in mild left neural foraminal stenosis. Mild bilateral neural foraminal stenosis is present at T10-11 due to facet hypertrophy. Paraspinal soft tissues are unremarkable.  MR LUMBAR SPINE FINDINGS  Grade 1 anterolisthesis of L4 on L5 is unchanged. There is no lumbar compression fracture. Prominent dorsal epidural fat is again seen throughout the lumbar spine. There is also mild, diffuse narrowing of the lumbar spinal canal due to congenitally short pedicles. Mild multilevel disc desiccation is again seen, most prominently at L3-4 and L4-5. L1 vertebral body hemangioma is noted. Conus medullaris is normal in signal and terminates at L1. 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 T2 hyperintense lesion in the lower pole the right kidney is unchanged and most likely represents a cyst.  L1-2:  Minimal disc bulge without stenosis.  L2-3: Minimal disc bulging, congenitally short pedicles, prominent dorsal epidural fat, and mild-to-moderate facet and ligamentum flavum hypertrophy without stenosis.  L3-4: Minimal disc bulging, congenitally short pedicles, prominent dorsal epidural fat, and moderate facet and ligamentum flavum hypertrophy result in mild narrowing of the thecal sac, unchanged. No neural foraminal stenosis.  L4-5: Listhesis with uncovering of the disc, congenitally short pedicles, and severe facet hypertrophy result in severe spinal stenosis and minimal right neural foraminal narrowing, not significantly changed.  L5-S1: Moderate to severe facet arthrosis without significant spinal canal or neural foraminal stenosis, unchanged.  IMPRESSION: MR THORACIC SPINE IMPRESSION  1. Mild thoracic spondylosis without spinal stenosis or cord compression. 2. Mild neural foraminal narrowing on the left at T9-10 and bilaterally at T10-11 due to facet arthrosis.  MR LUMBAR SPINE IMPRESSION  Unchanged appearance of lumbar disc degeneration and  facet arthrosis, most pronounced at L4-5 where there is severe spinal stenosis.   Electronically Signed   By: Logan Bores   On: 03/05/2014 21:09   US Abdomen Complete  03/23/2014   CLINICAL DATA:  Nausea and vomiting and elevated hepatic function studies.  EXAM: ULTRASOUND ABDOMEN COMPLETE  COMPARISON:  Lumbar spine series of March 11, 2014 and abdominal and pelvic CT scan of February 22, 2014.  FINDINGS: Gallbladder:  Surgically absent  Common bile duct:  Diameter: 4 mm  Liver:  The liver exhibits increased echotexture which is somewhat heterogeneous. There is no focal mass or ductal dilation.  IVC:  Portions are obscured by bowel gas.  Pancreas:  The pancreas exhibits no focal mass nor ductal dilation.  Spleen:  There is mild prominence of the spleen with overall volume of 419 cc and maximal dimension of 13.5 cm. The spleen is been noted to be prominent in the past.  Right Kidney:  Length: 12.4 cm. Echogenicity within normal limits. No mass or hydronephrosis visualized.  Left Kidney:  Length: 13.2 cm. Echogenicity within normal limits. No mass or hydronephrosis visualized.  Abdominal aorta:  The lower aorta and bifurcation or not demonstrated. No aneurysm is visible.  Other findings:  None.  IMPRESSION: 1. The echotexture of the liver is increased and heterogeneous. No discrete mass or ductal dilation are demonstrated. 2. There is no evidence of acute pancreatitis nor intrapancreatic or common bile duct dilation. 3. There is mild prominence of  the spleen which is stable.   Electronically Signed   By: David  Martinique   On: 03/23/2014 11:05   Dg Knee Complete 4 Views Left  03/05/2014   CLINICAL DATA:  Fall, knee numbness, predominately laterally.  EXAM: LEFT KNEE - COMPLETE 4+ VIEW  COMPARISON:  None.  FINDINGS: No acute fracture deformity or dislocation. Tricompartmental osteoarthrosis, severe within the patellofemoral, moderate to severe within the medial lateral compartments. No destructive bony lesions. Loose  body versus irregular fabella within the popliteal fossa. Soft tissue planes are nonsuspicious.  IMPRESSION: No acute fracture deformity nor dislocation.  Tricompartmental osteoarthrosis, severe within the patellofemoral compartment.   Electronically Signed   By: Elon Alas   On: 03/05/2014 18:46   Assessment/Plan  Generalized weakness Will have her work with physical therapy and occupational therapy team to help with gait training and muscle strengthening exercises.fall precautions. Skin care. Encourage to be out of bed.   Nausea and vomiting metfrmin induced vs opioid withdrawal vs gastroenteritis are top in differential Recent abdominal ultrasound was normal making gallbladder and liver etiology less likely though lft was slightly deranged in hospital. Recheck lft Metformin could be causing this as pt has metformin listed in her allergy list. Stop metformin for now and change to SSI No loose stool. Viral gastroenteritis can be another possibility drug withdrawal is another possibility given her dilated pupils, tremors and vomiting. She was admitted with drug OD and could have taken her hydrocodone more than prescribed at home Phenergan 25 mg po/im q8h prn nausea/vomiting and hydroxyzine 50 mg tid for now to help with her nausea, vomiting, anxiety and restlessness Will have her on lomotil 5 mg tid prn for loose stool/ abdominal cramps  anxiety Continue her lorazepam prn  Depression Continue her brintellix  Lumbar spinal stenosis pain under control. Continue hydrocodone 5-325 q6h prn and neurontin 300 mg tid. Monitor for bowel movement. If constipated, needs bowel regimen  Tremulous Likely from drug withdrawal and her anxiety could be contributing some as well.   DM Recent a1c 6.6, on metformin 500 mg bid and has nausea and vomiting. On med list and all review, has side effect to metformin. Will d/c metformin for now, monitor cbg and add SSI.   HTN Continue  losartan  OAB continue vesicare  Family/ staff Communication: reviewed care plan with patient and nursing supervisor  Goals of care: short term rehabilitation   Labs/tests ordered- cbc with diff, cmp, cbg    Blanchie Serve, MD  [facility] [phone] (Monday-Friday 8 am - 5 pm) [phone] (afterhours)

## 2014-04-05 NOTE — Telephone Encounter (Signed)
[facility] 

## 2014-04-10 ENCOUNTER — Other Ambulatory Visit: Payer: Self-pay | Admitting: Internal Medicine

## 2014-04-10 LAB — CBC WITH DIFFERENTIAL/PLATELET
Basophils Absolute: 0.1 10*3/uL (ref 0.0–0.1)
Basophils Relative: 1 % (ref 0–1)
Eosinophils Absolute: 0.4 10*3/uL (ref 0.0–0.7)
Eosinophils Relative: 6 % — ABNORMAL HIGH (ref 0–5)
HCT: 43.5 % (ref 36.0–46.0)
Hemoglobin: 14.7 g/dL (ref 12.0–15.0)
Lymphocytes Relative: 33 % (ref 12–46)
Lymphs Abs: 2 10*3/uL (ref 0.7–4.0)
MCH: 31.7 pg (ref 26.0–34.0)
MCHC: 33.8 g/dL (ref 30.0–36.0)
MCV: 93.8 fL (ref 78.0–100.0)
Monocytes Absolute: 0.6 10*3/uL (ref 0.1–1.0)
Monocytes Relative: 9 % (ref 3–12)
Neutro Abs: 3.2 10*3/uL (ref 1.7–7.7)
Neutrophils Relative %: 51 % (ref 43–77)
Platelets: 285 10*3/uL (ref 150–400)
RBC: 4.64 MIL/uL (ref 3.87–5.11)
RDW: 15.1 % (ref 11.5–15.5)
WBC: 6.2 10*3/uL (ref 4.0–10.5)

## 2014-04-10 LAB — COMPREHENSIVE METABOLIC PANEL
ALT: 25 U/L (ref 0–35)
AST: 41 U/L — ABNORMAL HIGH (ref 0–37)
Albumin: 3.6 g/dL (ref 3.5–5.2)
Alkaline Phosphatase: 130 U/L — ABNORMAL HIGH (ref 39–117)
BUN: 11 mg/dL (ref 6–23)
CO2: 23 mEq/L (ref 19–32)
Calcium: 8.8 mg/dL (ref 8.4–10.5)
Chloride: 103 mEq/L (ref 96–112)
Creat: 0.83 mg/dL (ref 0.50–1.10)
Glucose, Bld: 115 mg/dL — ABNORMAL HIGH (ref 70–99)
Potassium: 3.2 mEq/L — ABNORMAL LOW (ref 3.5–5.3)
Sodium: 143 mEq/L (ref 135–145)
Total Bilirubin: 0.8 mg/dL (ref 0.2–1.2)
Total Protein: 6.6 g/dL (ref 6.0–8.3)

## 2014-04-12 NOTE — ED Provider Notes (Signed)
CSN: [id number]     Arrival date & time 04/01/14  1208 History   First MD Initiated Contact with Patient 04/01/14 1233     Chief Complaint  Patient presents with  . Fall  . Facial Pain     (Consider location/radiation/quality/duration/timing/severity/associated sxs/prior Treatment) HPI Comments: Pt with hydrocephalus, and recurrent falls comes in with fall. Fell on to her face, and thus ace is hurting. Pt states taht she falls due to los of balance. She had home health visit today, and they asked her to come to the ER as she has too many needs/requirements for home health. Pt however is resisting the idea of SNF/Rehab at this time, and just wants to utilize home resources. No nausea, vomiting, visual complains, seizures, altered mental status, loss of consciousness, new weakness, or numbness.   Patient is a 68 y.o. female presenting with fall. The history is provided by the patient.  Fall This is a recurrent problem. The current episode started less than 1 hour ago. The problem occurs daily. Pertinent negatives include no chest pain, no abdominal pain, no headaches and no shortness of breath. She has tried nothing for the symptoms.    Past Medical History  Diagnosis Date  . Depression   . Anxiety   . Hypertension   . MVP (mitral valve prolapse)   . Hydrocephalus   . Diabetes mellitus without complication   . Sinus bradycardia   . Dizziness   . Falls   . Acute kidney injury   . Transaminitis   . Hypotension   . Acid reflux disease    Past Surgical History  Procedure Laterality Date  . Cholecystectomy    . Brain surgery    . Ercp N/A 02/28/2014    Procedure: ENDOSCOPIC RETROGRADE CHOLANGIOPANCREATOGRAPHY (ERCP);  Surgeon: Gatha Mayer, MD;  Location: [facility] ENDOSCOPY;  Service: Endoscopy;  Laterality: N/A;  talked to tiffany from or /ebp  . Esophagogastroduodenoscopy N/A 02/28/2014    Procedure: ESOPHAGOGASTRODUODENOSCOPY (EGD);  Surgeon: Gatha Mayer, MD;  Location: [facility]  ENDOSCOPY;  Service: Endoscopy;  Laterality: N/A;  . Ventriculoperitoneal shunt      right occipital   Family History  Problem Relation Age of Onset  . Heart disease Mother   . Stroke Mother   . Alcohol abuse Father   . Diabetes Father   . Cancer Neg Hx   . Hypertension Mother    History  Substance Use Topics  . Smoking status: Never Smoker   . Smokeless tobacco: Never Used  . Alcohol Use: No   OB History   Grav Para Term Preterm Abortions TAB SAB Ect Mult Living                 Review of Systems  Constitutional: Positive for activity change.  HENT: Positive for facial swelling.   Eyes: Negative for visual disturbance.  Respiratory: Negative for shortness of breath.   Cardiovascular: Negative for chest pain.  Gastrointestinal: Negative for nausea, vomiting and abdominal pain.  Genitourinary: Negative for dysuria.  Musculoskeletal: Negative for neck pain.  Skin: Positive for wound.  Neurological: Negative for headaches.  Psychiatric/Behavioral: Negative for confusion.      Allergies  Metformin and related; Ace inhibitors; Amitriptyline hcl; Erythromycin; and Penicillins  Home Medications   Prior to Admission medications   Medication Sig Start Date End Date Taking? Authorizing Provider  gabapentin (NEURONTIN) 300 MG capsule Take 300 mg by mouth 3 (three) times daily.    Yes Historical Provider, MD  losartan (COZAAR)  100 MG tablet Take 100 mg by mouth daily.   Yes Historical Provider, MD  metFORMIN (GLUCOPHAGE) 500 MG tablet Take 500 mg by mouth 2 (two) times daily with a meal.   Yes Historical Provider, MD  solifenacin (VESICARE) 10 MG tablet Take 10 mg by mouth daily.   Yes Historical Provider, MD  Vortioxetine HBr (BRINTELLIX) 10 MG TABS Take 10 mg by mouth every morning.    Yes Historical Provider, MD  HYDROcodone-acetaminophen (NORCO/VICODIN) 5-325 MG per tablet Take one tablet by mouth every 6 hours as needed for moderate pain 04/05/14   [name] L Reed, DO   LORazepam (ATIVAN) 1 MG tablet Take 1 tablet (1 mg total) by mouth every 6 (six) hours as needed. For anxiousness 04/05/14   [name] L Reed, DO   BP 163/68  Pulse 66  Temp(Src) 98 F (36.7 C) (Oral)  Resp 20  SpO2 97% Physical Exam  Nursing note and vitals reviewed. Constitutional: She is oriented to person, place, and time. She appears well-developed and well-nourished.  HENT:  Head: Normocephalic and atraumatic.  Eyes: EOM are normal. Pupils are equal, round, and reactive to light.  Neck: Neck supple.  Cardiovascular: Normal rate, regular rhythm and normal heart sounds.   No murmur heard. Pulmonary/Chest: Effort normal. No respiratory distress.  Abdominal: Soft. She exhibits no distension. There is no tenderness. There is no rebound and no guarding.  Neurological: She is alert and oriented to person, place, and time. No cranial nerve deficit. Coordination normal.  Skin: Skin is warm and dry.    ED Course  Procedures (including critical care time) Labs Review Labs Reviewed  BASIC METABOLIC PANEL - Abnormal; Notable for the following:    Glucose, Bld 148 (*)    GFR calc non Af Amer 86 (*)    All other components within normal limits  CBC WITH DIFFERENTIAL  URINALYSIS, ROUTINE W REFLEX MICROSCOPIC    Imaging Review No results found.   EKG Interpretation None      MDM   Final diagnoses:  Falls frequently  Hydrocephalus    Pt comes in with cc of fall. Hx of hydrocephalus. She was ambulated in the ER and was unsteady.  Due to recurrent falls, we asked her to be admitted, especially since home health deemed pt to be needing more help than what they can provide. Pt however prefers going home. Given her hurocephalus, spoke w/ Nsuegery. Reviewed CT and MRI for me. Nsurg to reprogram her shunt given recent MRI. SW consulted as well - and they will look for different home health. I anticipate pt to have falls again, and return ED visit for this, and may be admission to  SNF for long term care.  Varney Biles, MD 04/12/14 0127

## 2014-04-18 ENCOUNTER — Non-Acute Institutional Stay (SKILLED_NURSING_FACILITY): Payer: PRIVATE HEALTH INSURANCE | Admitting: Internal Medicine

## 2014-04-18 DIAGNOSIS — R1013 Epigastric pain: Secondary | ICD-10-CM

## 2014-04-18 DIAGNOSIS — R112 Nausea with vomiting, unspecified: Secondary | ICD-10-CM

## 2014-04-18 DIAGNOSIS — I10 Essential (primary) hypertension: Secondary | ICD-10-CM

## 2014-04-18 DIAGNOSIS — R002 Palpitations: Secondary | ICD-10-CM

## 2014-04-18 NOTE — Progress Notes (Signed)
Patient ID: [name], female   DOB: 1945/12/24, 68 y.o.   MRN: [id number]    Facility: [facility]  Chief Complaint  Patient presents with  . Acute Visit    nausea, vomiting, palpitations   Allergies  Allergen Reactions  . Metformin And Related Diarrhea    (takes metformin at home w/ meals)  . Ace Inhibitors Other (See Comments)    unknown  . Amitriptyline Hcl Other (See Comments)    unknown  . Erythromycin Hives  . Penicillins Hives   HPI 68 y/o female patient is seen today for acute concerns. She continues to be nauseous and have vomiting mostly in the morning. She complaints of heartburn and discomfort in upper abdominal area. She mentions that it sometimes goes to her back area.  Denies any hematemesis No diarrhea at present No fever or chills Therapy team have noticed her heart rate to go up during therapy and the rate being irregular. Patient mentions having palpitation for past few days. No known hx of atrial flutter or afib. Denies palpitations at present. Also has history of anxiety Reviewed blood pressure reading 140-166/ 78-90 cbg reviewed 111-178 with one reading of 198  ROS Denies dyspnea Was walking around with therapy team today in the hallway without any distress Denies headache Anxious during conversation Has history of DM and is on metformin New onset palpitation and irregular heart rate  Past Medical History  Diagnosis Date  . Depression   . Anxiety   . Hypertension   . MVP (mitral valve prolapse)   . Hydrocephalus   . Diabetes mellitus without complication   . Sinus bradycardia   . Dizziness   . Falls   . Acute kidney injury   . Transaminitis   . Hypotension   . Acid reflux disease    Medication reviewed. See [facility]   Medication List       This list is accurate as of: 04/18/14 11:59 PM.  Always use your most recent med list.               BRINTELLIX 10 MG Tabs  Generic drug:  Vortioxetine HBr  Take 10 mg  by mouth every morning.     gabapentin 300 MG capsule  Commonly known as:  NEURONTIN  Take 300 mg by mouth 3 (three) times daily.     HYDROcodone-acetaminophen 5-325 MG per tablet  Commonly known as:  NORCO/VICODIN  Take one tablet by mouth every 6 hours as needed for moderate pain     hydrOXYzine 50 MG tablet  Commonly known as:  ATARAX/VISTARIL  Take 50 mg by mouth 3 (three) times daily.     LORazepam 1 MG tablet  Commonly known as:  ATIVAN  Take 1 tablet (1 mg total) by mouth every 6 (six) hours as needed. For anxiousness     losartan 100 MG tablet  Commonly known as:  COZAAR  Take 100 mg by mouth daily.     metoCLOPramide 5 MG tablet  Commonly known as:  REGLAN  Take 5 mg by mouth 4 (four) times daily -  before meals and at bedtime.     potassium chloride 20 MEQ packet  Commonly known as:  KLOR-CON  Take 20 mEq by mouth daily.     promethazine 25 MG tablet  Commonly known as:  PHENERGAN  Take 25 mg by mouth every 6 (six) hours as needed for nausea or vomiting.     solifenacin 10 MG tablet  Commonly  known as:  VESICARE  Take 10 mg by mouth daily.        Physical exam BP 150/80  Pulse 78  Temp(Src) 98.5 F (36.9 C)  Resp 18  SpO2 95%  General- elderly female in no acute distress Head- atraumatic, normocephalic Neck- no lymphadenopathy, no thyromegaly Throat- moist mucus membrane Cardiovascular- irregular heart rate, no murmurs Respiratory- bilateral clear to auscultation, no wheeze, no rhonchi, no crackles, no use of accessory muscles Abdomen- bowel sounds present, soft, non tender at epigastric area, no cva tenderness Musculoskeletal- able to move all 4 extremities, no leg edema Neurological- no focal deficit, alert and oriented Skin- warm and dry Psychiatry- some anxiety noted but much calmer than last visit  Labs- 04/10/14 wbc 6.2, hb 14.7, hct 43.5, plt 285, na 143, k 3.2, cr 0.83, glu 115, alp 130, ast 41 Lab Results  Component Value Date   HGBA1C  6.6* 02/13/2014    Assessment/plan  Palpitations Irregular heart rate on exam Get ekg to rule out afib/ flutter Rate controlled though irregular at present Check tsh in am On lorazepam for anxiety, continue this  Epigastric pain With nausea and vomiting, raising concerns for gastritis vs ulcer vs h.pylori infection.with pain radiating to the back, personal hx of acute pancreatitis in past and history of DM, chronic pancreatitis is another concern. See below. Started on PPI today  Nausea/ vomiting Gastroparesis vs gastritis/ulcer vs h.pylori infection Send h.pylori ig G ab test with amylase and lipase Start pantoprazole 40 mg daily Continue reglan for now with prn promethazine  HTN bp elevated. Continue losartan 100 mg daily. Consider AV nodal blocking agent at low dosing after reviewing EKG to help control HR and BP.Marland Kitchen Monitor bp for now

## 2014-04-19 ENCOUNTER — Other Ambulatory Visit: Payer: Self-pay | Admitting: Internal Medicine

## 2014-04-19 LAB — COMPREHENSIVE METABOLIC PANEL
ALT: 21 U/L (ref 0–35)
AST: 31 U/L (ref 0–37)
Albumin: 3.4 g/dL — ABNORMAL LOW (ref 3.5–5.2)
Alkaline Phosphatase: 92 U/L (ref 39–117)
BUN: 13 mg/dL (ref 6–23)
CO2: 24 mEq/L (ref 19–32)
Calcium: 9.8 mg/dL (ref 8.4–10.5)
Chloride: 100 mEq/L (ref 96–112)
Creat: 0.9 mg/dL (ref 0.50–1.10)
Glucose, Bld: 124 mg/dL — ABNORMAL HIGH (ref 70–99)
Potassium: 4.2 mEq/L (ref 3.5–5.3)
Sodium: 139 mEq/L (ref 135–145)
Total Bilirubin: 1 mg/dL (ref 0.2–1.2)
Total Protein: 6.6 g/dL (ref 6.0–8.3)

## 2014-04-19 NOTE — Progress Notes (Addendum)
PT eval addendum - added G-codes   04-05-2014 1529  PT G-Codes **NOT FOR INPATIENT CLASS**  Functional Assessment Tool Used clinical judgment  Functional Limitation Mobility: Walking and moving around  Mobility: Walking and Moving Around Current Status (H7414) CJ  Mobility: Walking and Moving Around Goal Status (E3953) CI    Candy Sledge, PT, DPT [phone]

## 2014-05-02 ENCOUNTER — Non-Acute Institutional Stay (SKILLED_NURSING_FACILITY): Payer: PRIVATE HEALTH INSURANCE | Admitting: Internal Medicine

## 2014-05-02 DIAGNOSIS — E119 Type 2 diabetes mellitus without complications: Secondary | ICD-10-CM

## 2014-05-02 NOTE — Progress Notes (Signed)
Patient ID: [name], female   DOB: 1945/11/07, 68 y.o.   MRN: [id number]    Facility: [facility]  Chief Complaint  Patient presents with  . Acute Visit    sugar med concerns   Allergies  Allergen Reactions  . Metformin And Related Diarrhea    (takes metformin at home w/ meals)  . Ace Inhibitors Other (See Comments)    unknown  . Amitriptyline Hcl Other (See Comments)    unknown  . Erythromycin Hives  . Penicillins Hives   HPI 68 y/o female pt seen today for diabetes med concerns. She has known hx of dm and mentions being on metformin before. Never been on insulin. Here not on metformin with gi upset. Today feels better. Her cbg on review are all below 200 and last a1c from 7/15 is 6.6. On SSI novolog with meals but has not required any.  ROS Denies polyuria or polydypsia Denies nausea or vomiting Mood has been good No abdominal pain  Lab Results  Component Value Date   HGBA1C 6.6* 02/13/2014   Physical exam BP 140/68  Pulse 68  Temp(Src) 98 F (36.7 C)  Resp 18  General- elderly female in no acute distress Head- atraumatic, normocephalic Cardiovascular- normal s1,s2, no murmurs Respiratory- bilateral clear to auscultation, no wheeze, no rhonchi, no crackles, no use of accessory muscles Abdomen- bowel sounds present, soft, non tender Musculoskeletal- able to move all 4 extremities, no leg edema Skin- warm and dry Psychiatry- alert and oriented  Assessment/plan  Dm type 2 cbg overall appears controlled between 104-174 Has not required any premeal coverage in facility Will d/c her SSI Recheck a1c andconsider oral hypoglycemic treatment. Continue aspirin and losartan for now

## 2014-05-03 ENCOUNTER — Other Ambulatory Visit: Payer: Self-pay | Admitting: Internal Medicine

## 2014-05-03 LAB — HEMOGLOBIN A1C
Hgb A1c MFr Bld: 6.2 % — ABNORMAL HIGH (ref <5.7)
Mean Plasma Glucose: 131 mg/dL — ABNORMAL HIGH (ref <117)

## 2014-05-10 ENCOUNTER — Telehealth: Payer: Self-pay | Admitting: Internal Medicine

## 2014-05-10 NOTE — Telephone Encounter (Signed)
Original order from nursing home.  Patient has been discharged from nursing home and has fu on 10/12

## 2014-05-10 NOTE — Telephone Encounter (Signed)
Needs continuing orders for physical therapy: once a week for one week and twice a week for four weeks

## 2014-05-13 ENCOUNTER — Ambulatory Visit (INDEPENDENT_AMBULATORY_CARE_PROVIDER_SITE_OTHER): Payer: PRIVATE HEALTH INSURANCE | Admitting: Family

## 2014-05-13 ENCOUNTER — Encounter: Payer: Self-pay | Admitting: Family

## 2014-05-13 ENCOUNTER — Ambulatory Visit: Payer: Self-pay | Admitting: Gastroenterology

## 2014-05-13 VITALS — BP 120/70 | HR 78 | Temp 98.8°F | Ht 65.0 in | Wt 229.0 lb

## 2014-05-13 DIAGNOSIS — Z23 Encounter for immunization: Secondary | ICD-10-CM

## 2014-05-13 DIAGNOSIS — E119 Type 2 diabetes mellitus without complications: Secondary | ICD-10-CM

## 2014-05-13 DIAGNOSIS — W19XXXD Unspecified fall, subsequent encounter: Secondary | ICD-10-CM

## 2014-05-13 MED ORDER — BUSPIRONE HCL 7.5 MG PO TABS: 7.5000 mg | ORAL_TABLET | Freq: Three times a day (TID) | ORAL | Status: DC

## 2014-05-13 NOTE — Progress Notes (Signed)
Pre visit review using our clinic review tool, if applicable. No additional management support is needed unless otherwise documented below in the visit note. 

## 2014-05-13 NOTE — Assessment & Plan Note (Signed)
Continues to remain controlled. Continue with no medications. Instructed on hyper and hypoglycemia treatments.

## 2014-05-13 NOTE — Assessment & Plan Note (Signed)
Falls have decreased since being in the hospital. Concern for polypharmacy and medication potentially increasing risk of falls. Discussed options with patient and safety being a priority. D/C hydoxyzine and start BuSpar. Will also try Tylenol to help with knee pain to decrease use of Vicodin. Pt will speak with psych regarding the need for lorazepam. Discussed medication changes and patient is in agreement with the plan. She also inquires about driving. Will determine clearance once medication regimen is under control and falls have been reduced.

## 2014-05-13 NOTE — Patient Instructions (Signed)
Thank you for choosing [facility].  Summary/Instructions:   Please STOP taking the hydroxizine   Please try Tylenol in place of the Hydrocodone. If this does not control your pain, please try ice for 20 minutes. If this continues to be a problem, please call back.   Please START taking the Buspar for your anxiety  Your diabetes appears to be under control right now. If you feel yourself getting shaky or dizzy or your blood pressure is low, have 4 oz of juice or sugar.   Please follow up with Dr. Ronnald Ramp in about 1 month.   Continue no driving at this time.    Buspirone tablets What is this medicine? BUSPIRONE (byoo SPYE rone) is used to treat anxiety disorders. This medicine may be used for other purposes; ask your health care provider or pharmacist if you have questions. COMMON BRAND NAME(S): BuSpar What should I tell my health care provider before I take this medicine? They need to know if you have any of these conditions: -kidney or liver disease -an unusual or allergic reaction to buspirone, other medicines, foods, dyes, or preservatives -pregnant or trying to get pregnant -breast-feeding How should I use this medicine? Take this medicine by mouth with a glass of water. Follow the directions on the prescription label. You may take this medicine with or without food. To ensure that this medicine always works the same way for you, you should take it either always with or always without food. Take your doses at regular intervals. Do not take your medicine more often than directed. Do not stop taking except on the advice of your doctor or health care professional. Talk to your pediatrician regarding the use of this medicine in children. Special care may be needed. Overdosage: If you think you have taken too much of this medicine contact a poison control center or emergency room at once. NOTE: This medicine is only for you. Do not share this medicine with others. What if I miss  a dose? If you miss a dose, take it as soon as you can. If it is almost time for your next dose, take only that dose. Do not take double or extra doses. What may interact with this medicine? Do not take this medicine with any of the following medications: -linezolid -MAOIs like Carbex, Eldepryl, Marplan, Nardil, and Parnate -methylene blue -procarbazine This medicine may also interact with the following medications: -diazepam -digoxin -diltiazem -erythromycin -grapefruit juice -haloperidol -medicines for mental depression or mood problems -medicines for seizures like carbamazepine, phenobarbital and phenytoin -nefazodone -other medications for anxiety -rifampin -ritonavir -some antifungal medicines like itraconazole, ketoconazole, and voriconazole -verapamil -warfarin This list may not describe all possible interactions. Give your health care provider a list of all the medicines, herbs, non-prescription drugs, or dietary supplements you use. Also tell them if you smoke, drink alcohol, or use illegal drugs. Some items may interact with your medicine. What should I watch for while using this medicine? Visit your doctor or health care professional for regular checks on your progress. It may take 1 to 2 weeks before your anxiety gets better. You may get drowsy or dizzy. Do not drive, use machinery, or do anything that needs mental alertness until you know how this drug affects you. Do not stand or sit up quickly, especially if you are an older patient. This reduces the risk of dizzy or fainting spells. Alcohol can make you more drowsy and dizzy. Avoid alcoholic drinks. What side effects may I notice from  receiving this medicine? Side effects that you should report to your doctor or health care professional as soon as possible: -blurred vision or other vision changes -chest pain -confusion -difficulty breathing -feelings of hostility or anger -muscle aches and pains -numbness or  tingling in hands or feet -ringing in the ears -skin rash and itching -vomiting -weakness Side effects that usually do not require medical attention (report to your doctor or health care professional if they continue or are bothersome): -disturbed dreams, nightmares -headache -nausea -restlessness or nervousness -sore throat and nasal congestion -stomach upset This list may not describe all possible side effects. Call your doctor for medical advice about side effects. You may report side effects to FDA at 1-800-FDA-1088. Where should I keep my medicine? Keep out of the reach of children. Store at room temperature below 30 degrees C (86 degrees F). Protect from light. Keep container tightly closed. Throw away any unused medicine after the expiration date. NOTE: This sheet is a summary. It may not cover all possible information. If you have questions about this medicine, talk to your doctor, pharmacist, or health care provider.  2015, [employer]. (2010-02-26 18:06:11)

## 2014-05-13 NOTE — Progress Notes (Signed)
Subjective:    Patient ID: [name], female    DOB: May 08, 1946, 68 y.o.   MRN: [id number]  HPI:  [name] is a 68 y.o. female who presents today for hospital follow up. A neighbor is present for the interview with her permission. She was recently admitted to [facility] after she sustained a fall while trying to reach for the phone when she slipped and fell. 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 Following discharged to a skilled nursing facility for recovery. She now has a cane and a walker to use in her house. Indicates that she was feeling great until yesterday when she began to feel unbalanced and slightly shaky. She ate something which helped to remedy the situation.  1) Diabetes - Currently stable. Not currently taking any medication. Expresses concern on what to do if her blood sugar is too low or too high.   Lab Results  Component Value Date   HGBA1C 6.2* 05/03/2014   2) Multiple falls - Indicates she believes that she has multiple falls because of accidental misuse of medications.   Allergies  Allergen Reactions  . Metformin And Related Diarrhea    (takes metformin at home w/ meals)  . Ace Inhibitors Other (See Comments)    unknown  . Erythromycin Hives  . Penicillins Hives   Current Outpatient Prescriptions on File Prior to Visit  Medication Sig Dispense Refill  . gabapentin (NEURONTIN) 300 MG capsule Take 300 mg by mouth 3 (three) times daily.       Marland Kitchen LORazepam (ATIVAN) 1 MG tablet Take 1 tablet (1 mg total) by mouth every 6 (six) hours as needed. For anxiousness  120 tablet  5  . losartan (COZAAR) 100 MG tablet Take 100 mg by mouth daily.      . metoCLOPramide (REGLAN) 5 MG tablet Take 5 mg by mouth 4 (four) times daily -  before meals and at bedtime.      . potassium chloride (KLOR-CON) 20 MEQ packet Take 20 mEq by mouth daily.      . promethazine (PHENERGAN) 25 MG  tablet Take 25 mg by mouth every 6 (six) hours as needed for nausea or vomiting.      . solifenacin (VESICARE) 10 MG tablet Take 10 mg by mouth daily.      . Vortioxetine HBr (BRINTELLIX) 10 MG TABS Take 10 mg by mouth every morning.        No current facility-administered medications on file prior to visit.   Past Medical History  Diagnosis Date  . Depression   . Anxiety   . Hypertension   . MVP (mitral valve prolapse)   . Hydrocephalus   . Diabetes mellitus without complication   . Sinus bradycardia   . Dizziness   . Falls   . Acute kidney injury   . Transaminitis   . Hypotension   . Acid reflux disease      Review of Systems     See HPI Objective:     BP 120/70  Pulse 78  Temp(Src) 98.8 F (37.1 C) (Oral)  Ht 5\' 5"  (1.651 m)  Wt 229 lb (103.874 kg)  BMI 38.11 kg/m2  SpO2 92% Nursing note and vital signs reviewed.  Physical Exam  Constitutional: She is oriented to person, place, and time. She appears well-developed and well-nourished. No distress.  HENT:  Right Ear: Tympanic membrane, external ear  and ear canal normal. Decreased hearing is noted.  Left Ear: Tympanic membrane, external ear and ear canal normal. Decreased hearing is noted.  Cardiovascular: Normal rate, regular rhythm and normal heart sounds.   Pulmonary/Chest: Effort normal and breath sounds normal.  Neurological: She is alert and oriented to person, place, and time.  Skin: Skin is warm and dry.  Psychiatric: Her behavior is normal. Judgment and thought content normal. Her mood appears anxious.       Assessment & Plan:

## 2014-05-14 ENCOUNTER — Telehealth: Payer: Self-pay

## 2014-05-14 NOTE — Telephone Encounter (Signed)
Pt plans to schedule eye exam this week.

## 2014-05-15 ENCOUNTER — Telehealth: Payer: Self-pay | Admitting: *Deleted

## 2014-05-15 NOTE — Telephone Encounter (Signed)
Forwarding msg to Marya Amsler he saw her 05/13/14. Pls advise on msg below...Johny Chess

## 2014-05-15 NOTE — Telephone Encounter (Signed)
The patient was instructed to start trying to take Tylenol for the pain - so there is no prescription for this.

## 2014-05-15 NOTE — Telephone Encounter (Signed)
Called pt no answer x's 10 rings.../lmb 

## 2014-05-15 NOTE — Telephone Encounter (Signed)
I have not seen her recently enough to address this

## 2014-05-15 NOTE — Telephone Encounter (Signed)
Left msg on triage stating saw PA, and was told to stop taking the hydrocodone, and to have md to rx another pain med. Pt is requesting substitution for the hydrocodone...[name]

## 2014-05-17 NOTE — Telephone Encounter (Signed)
Called pt gave her md & Greg response. Pt had appt already schedule for 11/13. Moved appt due to ongoing pain...[name]

## 2014-05-21 ENCOUNTER — Telehealth: Payer: Self-pay | Admitting: Internal Medicine

## 2014-05-21 NOTE — Telephone Encounter (Signed)
Nurse states patient has appt 10/21.  Blood sugar has been ranging from 180 to 200.  Nurse states she has asked patient to bring log with her.  She is currently not on any blood sugar meds.  Patient has been taking losartan. . . Heart rate at rest has been 45-55 and then will go up to 65 when she gets up to move around.  Would like dosage of losartan looked at.

## 2014-05-22 ENCOUNTER — Ambulatory Visit (INDEPENDENT_AMBULATORY_CARE_PROVIDER_SITE_OTHER): Payer: PRIVATE HEALTH INSURANCE | Admitting: Internal Medicine

## 2014-05-22 ENCOUNTER — Encounter: Payer: Self-pay | Admitting: Internal Medicine

## 2014-05-22 ENCOUNTER — Other Ambulatory Visit (INDEPENDENT_AMBULATORY_CARE_PROVIDER_SITE_OTHER): Payer: PRIVATE HEALTH INSURANCE

## 2014-05-22 VITALS — BP 118/70 | HR 83 | Temp 98.0°F | Resp 16 | Wt 222.0 lb

## 2014-05-22 DIAGNOSIS — E785 Hyperlipidemia, unspecified: Secondary | ICD-10-CM | POA: Insufficient documentation

## 2014-05-22 DIAGNOSIS — M170 Bilateral primary osteoarthritis of knee: Secondary | ICD-10-CM

## 2014-05-22 DIAGNOSIS — K5900 Constipation, unspecified: Secondary | ICD-10-CM

## 2014-05-22 DIAGNOSIS — K921 Melena: Secondary | ICD-10-CM

## 2014-05-22 DIAGNOSIS — Z1231 Encounter for screening mammogram for malignant neoplasm of breast: Secondary | ICD-10-CM

## 2014-05-22 DIAGNOSIS — E559 Vitamin D deficiency, unspecified: Secondary | ICD-10-CM

## 2014-05-22 DIAGNOSIS — I10 Essential (primary) hypertension: Secondary | ICD-10-CM

## 2014-05-22 DIAGNOSIS — R739 Hyperglycemia, unspecified: Secondary | ICD-10-CM

## 2014-05-22 DIAGNOSIS — D519 Vitamin B12 deficiency anemia, unspecified: Secondary | ICD-10-CM

## 2014-05-22 LAB — VITAMIN D 25 HYDROXY (VIT D DEFICIENCY, FRACTURES): VITD: 40.38 ng/mL (ref 30.00–100.00)

## 2014-05-22 MED ORDER — HYDROCODONE-ACETAMINOPHEN 5-325 MG PO TABS: ORAL_TABLET | ORAL | Status: DC

## 2014-05-22 MED ORDER — LINACLOTIDE 145 MCG PO CAPS: 145.0000 ug | ORAL_CAPSULE | Freq: Every day | ORAL | Status: DC

## 2014-05-22 NOTE — Progress Notes (Signed)
Subjective:    Patient ID: [name], female    DOB: Apr 11, 1946, 68 y.o.   MRN: [id number]  Arthritis Presents for follow-up visit. The disease course has been fluctuating. She complains of pain. She reports no stiffness, joint swelling or joint warmth. Her pain is at a severity of 4/10. Associated symptoms include dry mouth, pain at night and pain while resting. Pertinent negatives include no diarrhea, dry eyes, dysuria, fatigue, fever, rash, Raynaud's syndrome, uveitis or weight loss. Her past medical history is significant for osteoarthritis. Her pertinent risk factors include overuse. Past treatments include acetaminophen and an opioid. The treatment provided significant relief. Factors aggravating her arthritis include activity. Compliance with prior treatments has been variable. Prior compliance problems include difficulty understanding directions.      Review of Systems  Constitutional: Negative.  Negative for fever, chills, weight loss, diaphoresis, appetite change and fatigue.  HENT: Negative.   Eyes: Negative.   Respiratory: Negative.  Negative for cough, choking, chest tightness, shortness of breath, wheezing and stridor.   Cardiovascular: Negative.  Negative for chest pain, palpitations and leg swelling.  Gastrointestinal: Positive for constipation, blood in stool and anal bleeding. Negative for nausea, vomiting, abdominal pain, diarrhea, abdominal distention and rectal pain.  Endocrine: Negative.   Genitourinary: Negative.  Negative for dysuria.  Musculoskeletal: Positive for arthralgias, arthritis and back pain. Negative for joint swelling, myalgias, neck pain, neck stiffness and stiffness.  Skin: Negative.  Negative for rash.  Allergic/Immunologic: Negative.   Neurological: Negative.  Negative for dizziness, syncope, speech difficulty, weakness, light-headedness, numbness and headaches.  Hematological: Negative.  Negative for adenopathy. Does not bruise/bleed easily.    Psychiatric/Behavioral: Positive for confusion and decreased concentration. Negative for suicidal ideas, hallucinations, behavioral problems, sleep disturbance, self-injury and agitation. The patient is not nervous/anxious and is not hyperactive.        Objective:   Physical Exam  Vitals reviewed. Constitutional: She is oriented to person, place, and time. She appears well-developed and well-nourished. No distress.  HENT:  Head: Normocephalic and atraumatic.  Mouth/Throat: Oropharynx is clear and moist. No oropharyngeal exudate.  Eyes: Conjunctivae are normal. Right eye exhibits no discharge. Left eye exhibits no discharge. No scleral icterus.  Neck: Normal range of motion. Neck supple. No JVD present. No tracheal deviation present. No thyromegaly present.  Cardiovascular: Normal rate, regular rhythm, normal heart sounds and intact distal pulses.  Exam reveals no gallop and no friction rub.   No murmur heard. Pulmonary/Chest: Effort normal and breath sounds normal. No stridor. No respiratory distress. She has no wheezes. She has no rales. She exhibits no tenderness.  Abdominal: Soft. Bowel sounds are normal. She exhibits no distension and no mass. There is no tenderness. There is no rebound and no guarding.  Musculoskeletal: Normal range of motion. She exhibits no edema and no tenderness.  Lymphadenopathy:    She has no cervical adenopathy.  Neurological: She is oriented to person, place, and time.  Skin: Skin is warm and dry. No rash noted. She is not diaphoretic. No erythema. No pallor.  Psychiatric: She has a normal mood and affect. Her behavior is normal. Judgment and thought content normal.     Lab Results  Component Value Date   WBC 6.2 04/10/2014   HGB 14.7 04/10/2014   HCT 43.5 04/10/2014   PLT 285 04/10/2014   GLUCOSE 124* 04/19/2014   CHOL 204* 08/15/2013   TRIG 170.0* 08/15/2013   HDL 62.30 08/15/2013   LDLDIRECT 119.8 08/15/2013   ALT 21  04/19/2014   AST 31 04/19/2014   NA 139  04/19/2014   K 4.2 04/19/2014   CL 100 04/19/2014   CREATININE 0.90 04/19/2014   BUN 13 04/19/2014   CO2 24 04/19/2014   TSH 2.650 04/02/2014   INR 1.25 03/28/2014   HGBA1C 6.2* 05/03/2014       Assessment & Plan:

## 2014-05-22 NOTE — Patient Instructions (Signed)

## 2014-05-22 NOTE — Progress Notes (Signed)
Pre visit review using our clinic review tool, if applicable. No additional management support is needed unless otherwise documented below in the visit note. 

## 2014-05-23 DIAGNOSIS — M170 Bilateral primary osteoarthritis of knee: Secondary | ICD-10-CM | POA: Insufficient documentation

## 2014-05-23 LAB — COMPREHENSIVE METABOLIC PANEL
ALT: 65 U/L — ABNORMAL HIGH (ref 0–35)
AST: 51 U/L — ABNORMAL HIGH (ref 0–37)
Albumin: 3.3 g/dL — ABNORMAL LOW (ref 3.5–5.2)
Alkaline Phosphatase: 103 U/L (ref 39–117)
BUN: 16 mg/dL (ref 6–23)
CO2: 25 mEq/L (ref 19–32)
Calcium: 9.6 mg/dL (ref 8.4–10.5)
Chloride: 102 mEq/L (ref 96–112)
Creatinine, Ser: 1.2 mg/dL (ref 0.4–1.2)
GFR: 48.05 mL/min — ABNORMAL LOW (ref >60.00)
Glucose, Bld: 102 mg/dL — ABNORMAL HIGH (ref 70–99)
Potassium: 4.3 mEq/L (ref 3.5–5.1)
Sodium: 137 mEq/L (ref 135–145)
Total Bilirubin: 0.9 mg/dL (ref 0.2–1.2)
Total Protein: 7.7 g/dL (ref 6.0–8.3)

## 2014-05-23 LAB — LIPID PANEL
Cholesterol: 176 mg/dL (ref 0–200)
HDL: 46.3 mg/dL (ref >39.00)
LDL Cholesterol: 90 mg/dL (ref 0–99)
NonHDL: 129.7
Total CHOL/HDL Ratio: 4
Triglycerides: 199 mg/dL — ABNORMAL HIGH (ref 0.0–149.0)
VLDL: 39.8 mg/dL (ref 0.0–40.0)

## 2014-05-23 NOTE — Assessment & Plan Note (Signed)
GI referral , she is due for a colonoscopy 

## 2014-05-23 NOTE — Assessment & Plan Note (Signed)
Cont norco as needed for pain 

## 2014-05-23 NOTE — Assessment & Plan Note (Signed)
Her BP is well controlled 

## 2014-05-23 NOTE — Assessment & Plan Note (Signed)
I will recheck her Vit D level today She tells me that she is taking it

## 2014-05-23 NOTE — Assessment & Plan Note (Signed)
Recent labs reviewed and I do not see any causes for constipation This may be related to her opioid pain med as well as other factors Will try linzess

## 2014-05-24 ENCOUNTER — Ambulatory Visit: Payer: PRIVATE HEALTH INSURANCE | Admitting: Internal Medicine

## 2014-05-24 NOTE — Telephone Encounter (Signed)
Patient stated that she fell yesterday at 1:00 hit her head on door frame, landed on rt knee, no bruises or swelling. Any questions call [phone] ask for [facility].  Thank you

## 2014-05-27 NOTE — Telephone Encounter (Signed)
Called [name] gave her md response. Made pt f/u appt for tomorrow to discuss nausea sxs & pain med...[name]

## 2014-05-27 NOTE — Telephone Encounter (Signed)
Robin from her home called in and said that now pt is very nauseous and needs something called in.  She was asking for a return call  And best number is [phone]

## 2014-05-27 NOTE — Telephone Encounter (Signed)
Ask her to stop taking norco for pain

## 2014-05-28 ENCOUNTER — Ambulatory Visit (INDEPENDENT_AMBULATORY_CARE_PROVIDER_SITE_OTHER): Payer: PRIVATE HEALTH INSURANCE | Admitting: Internal Medicine

## 2014-05-28 ENCOUNTER — Encounter: Payer: Self-pay | Admitting: Internal Medicine

## 2014-05-28 VITALS — BP 136/82 | HR 80 | Temp 98.5°F | Resp 16 | Wt 219.0 lb

## 2014-05-28 DIAGNOSIS — G43A0 Cyclical vomiting, not intractable: Secondary | ICD-10-CM

## 2014-05-28 DIAGNOSIS — R1115 Cyclical vomiting syndrome unrelated to migraine: Secondary | ICD-10-CM

## 2014-05-28 MED ORDER — PROMETHAZINE HCL 12.5 MG PO TABS: 12.5000 mg | ORAL_TABLET | Freq: Four times a day (QID) | ORAL | Status: DC | PRN

## 2014-05-28 NOTE — Patient Instructions (Signed)

## 2014-05-28 NOTE — Progress Notes (Signed)
Pre visit review using our clinic review tool, if applicable. No additional management support is needed unless otherwise documented below in the visit note. 

## 2014-05-30 DIAGNOSIS — R269 Unspecified abnormalities of gait and mobility: Secondary | ICD-10-CM

## 2014-05-30 DIAGNOSIS — K219 Gastro-esophageal reflux disease without esophagitis: Secondary | ICD-10-CM

## 2014-05-30 DIAGNOSIS — M6281 Muscle weakness (generalized): Secondary | ICD-10-CM

## 2014-05-30 DIAGNOSIS — G9340 Encephalopathy, unspecified: Secondary | ICD-10-CM

## 2014-06-02 ENCOUNTER — Encounter: Payer: Self-pay | Admitting: Internal Medicine

## 2014-06-02 NOTE — Progress Notes (Signed)
   Subjective:    Patient ID: [name], female    DOB: 1946-05-28, 68 y.o.   MRN: [id number]  HPI Comments: She returns and complains of recurrent episodes of nausea with vomiting, this has been a recurrent problem over the last 3 months in the midst of multiple admissions for falls and complications. She has been taking phenergan and it helps quite a bit. The last episode of vomiting was last night when she went to dinner and felt sick and vomited outside the restaurant. She is not aware of anything that precipitates this and tells me that norco dose not cause nausea.     Review of Systems  Constitutional: Negative.  Negative for fever, chills, diaphoresis, appetite change and fatigue.  HENT: Negative.   Eyes: Negative.   Respiratory: Negative.  Negative for cough, choking, chest tightness, shortness of breath and stridor.   Cardiovascular: Negative.  Negative for chest pain, palpitations and leg swelling.  Gastrointestinal: Positive for nausea, vomiting and constipation. Negative for abdominal pain, diarrhea, blood in stool, abdominal distention, anal bleeding and rectal pain.  Endocrine: Negative.   Genitourinary: Negative.   Musculoskeletal: Negative.  Negative for myalgias, back pain and arthralgias.  Skin: Negative.   Allergic/Immunologic: Negative.   Neurological: Negative.  Negative for dizziness, tremors, syncope, weakness and light-headedness.  Hematological: Negative.  Negative for adenopathy. Does not bruise/bleed easily.  Psychiatric/Behavioral: Negative.        Objective:   Physical Exam  Constitutional: She is oriented to person, place, and time. She appears well-developed and well-nourished. No distress.  HENT:  Head: Normocephalic and atraumatic.  Mouth/Throat: Oropharynx is clear and moist. No oropharyngeal exudate.  Eyes: Conjunctivae are normal. Right eye exhibits no discharge. Left eye exhibits no discharge. No scleral icterus.  Neck: Normal range of  motion. Neck supple. No JVD present. No tracheal deviation present. No thyromegaly present.  Cardiovascular: Normal rate, regular rhythm, normal heart sounds and intact distal pulses.  Exam reveals no gallop and no friction rub.   No murmur heard. Pulmonary/Chest: Effort normal and breath sounds normal. No stridor. No respiratory distress. She has no wheezes. She has no rales. She exhibits no tenderness.  Abdominal: Soft. Bowel sounds are normal. She exhibits no distension and no mass. There is no tenderness. There is no rebound and no guarding.  Musculoskeletal: Normal range of motion. She exhibits no edema or tenderness.  Lymphadenopathy:    She has no cervical adenopathy.  Neurological: She is oriented to person, place, and time.  Skin: Skin is warm and dry. No rash noted. She is not diaphoretic. No erythema. No pallor.  Vitals reviewed.         Assessment & Plan:   She will cont phenergan for the N and V There are multiple factors affecting this (NPH, anxiety, meds, dementia) She sees GI soon so may need upper endoscopy Will follow for now

## 2014-06-06 ENCOUNTER — Other Ambulatory Visit: Payer: Self-pay | Admitting: Family

## 2014-06-10 ENCOUNTER — Other Ambulatory Visit: Payer: Self-pay

## 2014-06-10 MED ORDER — BUSPIRONE HCL 7.5 MG PO TABS: 7.5000 mg | ORAL_TABLET | Freq: Three times a day (TID) | ORAL | Status: DC

## 2014-06-12 ENCOUNTER — Other Ambulatory Visit: Payer: Self-pay | Admitting: Gastroenterology

## 2014-06-14 ENCOUNTER — Other Ambulatory Visit: Payer: Self-pay | Admitting: Internal Medicine

## 2014-06-14 ENCOUNTER — Ambulatory Visit: Payer: PRIVATE HEALTH INSURANCE | Admitting: Cardiovascular Disease

## 2014-06-17 ENCOUNTER — Ambulatory Visit: Payer: PRIVATE HEALTH INSURANCE | Admitting: Internal Medicine

## 2014-06-20 LAB — HM DIABETES EYE EXAM

## 2014-06-20 NOTE — Progress Notes (Signed)
PST RN called patient x 8 times, no answer at phone number

## 2014-06-21 ENCOUNTER — Encounter (HOSPITAL_COMMUNITY): Admission: RE | Payer: Self-pay | Source: Ambulatory Visit

## 2014-06-21 ENCOUNTER — Ambulatory Visit (HOSPITAL_COMMUNITY)
Admission: RE | Admit: 2014-06-21 | Payer: PRIVATE HEALTH INSURANCE | Source: Ambulatory Visit | Admitting: Gastroenterology

## 2014-06-21 SURGERY — ESOPHAGOGASTRODUODENOSCOPY (EGD) WITH PROPOFOL
Anesthesia: Monitor Anesthesia Care

## 2014-07-18 ENCOUNTER — Ambulatory Visit (INDEPENDENT_AMBULATORY_CARE_PROVIDER_SITE_OTHER): Payer: PRIVATE HEALTH INSURANCE | Admitting: Internal Medicine

## 2014-07-18 ENCOUNTER — Telehealth: Payer: Self-pay | Admitting: Internal Medicine

## 2014-07-18 ENCOUNTER — Encounter: Payer: Self-pay | Admitting: Internal Medicine

## 2014-07-18 ENCOUNTER — Other Ambulatory Visit: Payer: Self-pay | Admitting: *Deleted

## 2014-07-18 ENCOUNTER — Ambulatory Visit (INDEPENDENT_AMBULATORY_CARE_PROVIDER_SITE_OTHER)
Admission: RE | Admit: 2014-07-18 | Discharge: 2014-07-18 | Disposition: A | Discharge status: Home / Self Care | Payer: PRIVATE HEALTH INSURANCE | Source: Ambulatory Visit | Attending: Internal Medicine | Admitting: Internal Medicine

## 2014-07-18 VITALS — BP 140/90 | HR 83 | Temp 99.0°F | Resp 16 | Ht 65.0 in | Wt 225.0 lb

## 2014-07-18 DIAGNOSIS — S8992XA Unspecified injury of left lower leg, initial encounter: Secondary | ICD-10-CM

## 2014-07-18 DIAGNOSIS — F411 Generalized anxiety disorder: Secondary | ICD-10-CM

## 2014-07-18 DIAGNOSIS — S8990XA Unspecified injury of unspecified lower leg, initial encounter: Secondary | ICD-10-CM | POA: Insufficient documentation

## 2014-07-18 DIAGNOSIS — M170 Bilateral primary osteoarthritis of knee: Secondary | ICD-10-CM

## 2014-07-18 MED ORDER — HYDROCODONE-ACETAMINOPHEN 5-325 MG PO TABS: ORAL_TABLET | ORAL | Status: DC

## 2014-07-18 MED ORDER — SOLIFENACIN SUCCINATE 10 MG PO TABS: 10.0000 mg | ORAL_TABLET | Freq: Every day | ORAL | Status: DC

## 2014-07-18 NOTE — Progress Notes (Signed)
Subjective:    Patient ID: [name], female    DOB: 1946-06-26, 68 y.o.   MRN: [id number]  Knee Pain  The incident occurred 3 to 5 days ago. The incident occurred at home. The injury mechanism was a fall. The pain is present in the left knee. The quality of the pain is described as aching. The pain is at a severity of 2/10. The pain is mild. The pain has been fluctuating since onset. Pertinent negatives include no inability to bear weight, loss of motion, loss of sensation, muscle weakness, numbness or tingling. The symptoms are aggravated by movement, palpation and weight bearing. She has tried acetaminophen and NSAIDs for the symptoms. The treatment provided mild relief.      Review of Systems  Constitutional: Negative.   HENT: Negative.   Eyes: Negative.   Respiratory: Negative.   Cardiovascular: Negative.  Negative for leg swelling.  Gastrointestinal: Negative.  Negative for abdominal pain.  Endocrine: Negative.   Genitourinary: Negative.   Musculoskeletal: Positive for arthralgias.  Skin: Negative.   Allergic/Immunologic: Negative.   Neurological: Negative.  Negative for tingling and numbness.  Hematological: Negative.  Negative for adenopathy. Does not bruise/bleed easily.  Psychiatric/Behavioral: Negative.        Objective:   Physical Exam  Constitutional: She is oriented to person, place, and time. She appears well-developed and well-nourished. No distress.  HENT:  Head: Normocephalic and atraumatic.  Mouth/Throat: Oropharynx is clear and moist. No oropharyngeal exudate.  Eyes: Conjunctivae are normal. Right eye exhibits no discharge. Left eye exhibits no discharge. No scleral icterus.  Neck: Normal range of motion. Neck supple. No JVD present. No tracheal deviation present. No thyromegaly present.  Cardiovascular: Normal rate, regular rhythm, normal heart sounds and intact distal pulses.  Exam reveals no gallop and no friction rub.   No murmur  heard. Pulmonary/Chest: Effort normal and breath sounds normal. No stridor. No respiratory distress. She has no wheezes. She has no rales. She exhibits no tenderness.  Abdominal: Soft. Bowel sounds are normal. She exhibits no distension and no mass. There is no tenderness. There is no rebound and no guarding.  Musculoskeletal: She exhibits no edema or tenderness.       Right knee: Normal.       Left knee: She exhibits decreased range of motion and deformity (DJD changes). She exhibits no swelling, no effusion, no ecchymosis, no laceration, no erythema, normal alignment, no LCL laxity, normal patellar mobility, no bony tenderness, normal meniscus and no MCL laxity. No medial joint line, no MCL, no LCL and no patellar tendon tenderness noted.  Lymphadenopathy:    She has no cervical adenopathy.  Neurological: She is oriented to person, place, and time.  Skin: Skin is warm and dry. No rash noted. She is not diaphoretic. No erythema. No pallor.  Vitals reviewed.    Lab Results  Component Value Date   WBC 6.2 04/10/2014   HGB 14.7 04/10/2014   HCT 43.5 04/10/2014   PLT 285 04/10/2014   GLUCOSE 102* 05/22/2014   CHOL 176 05/22/2014   TRIG 199.0* 05/22/2014   HDL 46.30 05/22/2014   LDLDIRECT 119.8 08/15/2013   LDLCALC 90 05/22/2014   ALT 65* 05/22/2014   AST 51* 05/22/2014   NA 137 05/22/2014   K 4.3 05/22/2014   CL 102 05/22/2014   CREATININE 1.2 05/22/2014   BUN 16 05/22/2014   CO2 25 05/22/2014   TSH 2.650 04/02/2014   INR 1.25 03/28/2014   HGBA1C 6.2* 05/03/2014  Assessment & Plan:

## 2014-07-18 NOTE — Telephone Encounter (Signed)
Pt request refill for vesicare to be send to [facility]. Please advise.

## 2014-07-18 NOTE — Patient Instructions (Signed)

## 2014-07-19 ENCOUNTER — Telehealth: Payer: Self-pay | Admitting: Internal Medicine

## 2014-07-19 DIAGNOSIS — M170 Bilateral primary osteoarthritis of knee: Secondary | ICD-10-CM

## 2014-07-19 MED ORDER — DICLOFENAC SODIUM 1 % TD GEL: 4.0000 g | Freq: Four times a day (QID) | TRANSDERMAL | Status: DC

## 2014-07-19 NOTE — Telephone Encounter (Signed)
done

## 2014-07-19 NOTE — Assessment & Plan Note (Signed)
Will restart norco for the pain It sounds like she has developed instability in the left knee - will refer to sports med for further evaluation

## 2014-07-19 NOTE — Assessment & Plan Note (Signed)
Her exam does not suggest any acute compromise to the knee or joint laxity The pain film shows rather severe DJD, will control the pain with norco Will refer to sports medicine for further evaluation

## 2014-07-19 NOTE — Telephone Encounter (Signed)
Patient is requesting a script for voltaren Gel 1% for her knees to be sent to [facility] on [address].

## 2014-07-22 ENCOUNTER — Other Ambulatory Visit: Payer: Self-pay | Admitting: *Deleted

## 2014-07-22 DIAGNOSIS — M170 Bilateral primary osteoarthritis of knee: Secondary | ICD-10-CM

## 2014-07-22 MED ORDER — DICLOFENAC SODIUM 1 % TD GEL: 4.0000 g | Freq: Four times a day (QID) | TRANSDERMAL | Status: DC

## 2014-07-23 ENCOUNTER — Encounter (HOSPITAL_COMMUNITY): Payer: Self-pay | Admitting: Cardiology

## 2014-07-23 ENCOUNTER — Inpatient Hospital Stay (HOSPITAL_COMMUNITY)
Admission: EM | Admit: 2014-07-23 | Discharge: 2014-07-28 | DRG: 917 | Disposition: A | Discharge status: Home / Self Care | Payer: PRIVATE HEALTH INSURANCE | Attending: Internal Medicine | Admitting: Internal Medicine

## 2014-07-23 ENCOUNTER — Emergency Department (HOSPITAL_COMMUNITY): Payer: PRIVATE HEALTH INSURANCE

## 2014-07-23 DIAGNOSIS — F419 Anxiety disorder, unspecified: Secondary | ICD-10-CM | POA: Diagnosis present

## 2014-07-23 DIAGNOSIS — T40601A Poisoning by unspecified narcotics, accidental (unintentional), initial encounter: Secondary | ICD-10-CM | POA: Diagnosis present

## 2014-07-23 DIAGNOSIS — E876 Hypokalemia: Secondary | ICD-10-CM | POA: Diagnosis present

## 2014-07-23 DIAGNOSIS — R4182 Altered mental status, unspecified: Secondary | ICD-10-CM | POA: Diagnosis present

## 2014-07-23 DIAGNOSIS — I951 Orthostatic hypotension: Secondary | ICD-10-CM | POA: Diagnosis present

## 2014-07-23 DIAGNOSIS — F4321 Adjustment disorder with depressed mood: Secondary | ICD-10-CM | POA: Diagnosis present

## 2014-07-23 DIAGNOSIS — I341 Nonrheumatic mitral (valve) prolapse: Secondary | ICD-10-CM | POA: Diagnosis present

## 2014-07-23 DIAGNOSIS — D519 Vitamin B12 deficiency anemia, unspecified: Secondary | ICD-10-CM | POA: Diagnosis present

## 2014-07-23 DIAGNOSIS — K219 Gastro-esophageal reflux disease without esophagitis: Secondary | ICD-10-CM | POA: Diagnosis present

## 2014-07-23 DIAGNOSIS — M170 Bilateral primary osteoarthritis of knee: Secondary | ICD-10-CM

## 2014-07-23 DIAGNOSIS — F329 Major depressive disorder, single episode, unspecified: Secondary | ICD-10-CM | POA: Diagnosis present

## 2014-07-23 DIAGNOSIS — E119 Type 2 diabetes mellitus without complications: Secondary | ICD-10-CM | POA: Diagnosis present

## 2014-07-23 DIAGNOSIS — Z982 Presence of cerebrospinal fluid drainage device: Secondary | ICD-10-CM

## 2014-07-23 DIAGNOSIS — K7200 Acute and subacute hepatic failure without coma: Secondary | ICD-10-CM | POA: Diagnosis present

## 2014-07-23 DIAGNOSIS — G910 Communicating hydrocephalus: Secondary | ICD-10-CM

## 2014-07-23 DIAGNOSIS — G919 Hydrocephalus, unspecified: Secondary | ICD-10-CM | POA: Diagnosis present

## 2014-07-23 DIAGNOSIS — G8929 Other chronic pain: Secondary | ICD-10-CM | POA: Diagnosis present

## 2014-07-23 DIAGNOSIS — R739 Hyperglycemia, unspecified: Secondary | ICD-10-CM

## 2014-07-23 DIAGNOSIS — I10 Essential (primary) hypertension: Secondary | ICD-10-CM | POA: Diagnosis present

## 2014-07-23 DIAGNOSIS — T50901A Poisoning by unspecified drugs, medicaments and biological substances, accidental (unintentional), initial encounter: Secondary | ICD-10-CM | POA: Diagnosis present

## 2014-07-23 LAB — URINE MICROSCOPIC-ADD ON

## 2014-07-23 LAB — CBC WITH DIFFERENTIAL/PLATELET
Basophils Absolute: 0 10*3/uL (ref 0.0–0.1)
Basophils Relative: 1 % (ref 0–1)
Eosinophils Absolute: 0.2 10*3/uL (ref 0.0–0.7)
Eosinophils Relative: 4 % (ref 0–5)
HCT: 38.6 % (ref 36.0–46.0)
Hemoglobin: 12.3 g/dL (ref 12.0–15.0)
Lymphocytes Relative: 18 % (ref 12–46)
Lymphs Abs: 1 10*3/uL (ref 0.7–4.0)
MCH: 29.5 pg (ref 26.0–34.0)
MCHC: 31.9 g/dL (ref 30.0–36.0)
MCV: 92.6 fL (ref 78.0–100.0)
Monocytes Absolute: 0.4 10*3/uL (ref 0.1–1.0)
Monocytes Relative: 7 % (ref 3–12)
Neutro Abs: 4 10*3/uL (ref 1.7–7.7)
Neutrophils Relative %: 70 % (ref 43–77)
Platelets: 232 10*3/uL (ref 150–400)
RBC: 4.17 MIL/uL (ref 3.87–5.11)
RDW: 14.7 % (ref 11.5–15.5)
WBC: 5.6 10*3/uL (ref 4.0–10.5)

## 2014-07-23 LAB — URINALYSIS, ROUTINE W REFLEX MICROSCOPIC
Glucose, UA: NEGATIVE mg/dL
Hgb urine dipstick: NEGATIVE
Ketones, ur: NEGATIVE mg/dL
Nitrite: NEGATIVE
Protein, ur: NEGATIVE mg/dL
Specific Gravity, Urine: 1.023 (ref 1.005–1.030)
Urobilinogen, UA: 1 mg/dL (ref 0.0–1.0)
pH: 6 (ref 5.0–8.0)

## 2014-07-23 LAB — RAPID URINE DRUG SCREEN, HOSP PERFORMED
Amphetamines: NOT DETECTED
Barbiturates: NOT DETECTED
Benzodiazepines: POSITIVE — AB
Cocaine: NOT DETECTED
Opiates: POSITIVE — AB
Tetrahydrocannabinol: NOT DETECTED

## 2014-07-23 LAB — COMPREHENSIVE METABOLIC PANEL
ALT: 69 U/L — ABNORMAL HIGH (ref 0–35)
AST: 156 U/L — ABNORMAL HIGH (ref 0–37)
Albumin: 3.1 g/dL — ABNORMAL LOW (ref 3.5–5.2)
Alkaline Phosphatase: 109 U/L (ref 39–117)
Anion gap: 12 (ref 5–15)
BUN: 16 mg/dL (ref 6–23)
CO2: 24 mmol/L (ref 19–32)
Calcium: 9 mg/dL (ref 8.4–10.5)
Chloride: 104 mEq/L (ref 96–112)
Creatinine, Ser: 1.04 mg/dL (ref 0.50–1.10)
GFR calc Af Amer: 63 mL/min — ABNORMAL LOW (ref >90)
GFR calc non Af Amer: 54 mL/min — ABNORMAL LOW (ref >90)
Glucose, Bld: 169 mg/dL — ABNORMAL HIGH (ref 70–99)
Potassium: 4.2 mmol/L (ref 3.5–5.1)
Sodium: 140 mmol/L (ref 135–145)
Total Bilirubin: 1.2 mg/dL (ref 0.3–1.2)
Total Protein: 6.5 g/dL (ref 6.0–8.3)

## 2014-07-23 LAB — I-STAT ARTERIAL BLOOD GAS, ED
Acid-base deficit: 1 mmol/L (ref 0.0–2.0)
Bicarbonate: 24.9 mEq/L — ABNORMAL HIGH (ref 20.0–24.0)
O2 Saturation: 94 %
Patient temperature: 99.4
TCO2: 26 mmol/L (ref 0–100)
pCO2 arterial: 46.4 mmHg — ABNORMAL HIGH (ref 35.0–45.0)
pH, Arterial: 7.34 — ABNORMAL LOW (ref 7.350–7.450)
pO2, Arterial: 78 mmHg — ABNORMAL LOW (ref 80.0–100.0)

## 2014-07-23 LAB — PROTIME-INR
INR: 1.26 (ref 0.00–1.49)
Prothrombin Time: 15.9 seconds — ABNORMAL HIGH (ref 11.6–15.2)

## 2014-07-23 LAB — CBC
HCT: 40.7 % (ref 36.0–46.0)
Hemoglobin: 13.6 g/dL (ref 12.0–15.0)
MCH: 31.3 pg (ref 26.0–34.0)
MCHC: 33.4 g/dL (ref 30.0–36.0)
MCV: 93.8 fL (ref 78.0–100.0)
Platelets: 179 10*3/uL (ref 150–400)
RBC: 4.34 MIL/uL (ref 3.87–5.11)
RDW: 14.9 % (ref 11.5–15.5)
WBC: 5.1 10*3/uL (ref 4.0–10.5)

## 2014-07-23 LAB — I-STAT CG4 LACTIC ACID, ED: Lactic Acid, Venous: 3.1 mmol/L — ABNORMAL HIGH (ref 0.5–2.2)

## 2014-07-23 LAB — CREATININE, SERUM
Creatinine, Ser: 0.86 mg/dL (ref 0.50–1.10)
GFR calc Af Amer: 79 mL/min — ABNORMAL LOW (ref >90)
GFR calc non Af Amer: 68 mL/min — ABNORMAL LOW (ref >90)

## 2014-07-23 LAB — GLUCOSE, CAPILLARY: Glucose-Capillary: 143 mg/dL — ABNORMAL HIGH (ref 70–99)

## 2014-07-23 LAB — CBG MONITORING, ED: Glucose-Capillary: 162 mg/dL — ABNORMAL HIGH (ref 70–99)

## 2014-07-23 LAB — ETHANOL: Alcohol, Ethyl (B): <5 mg/dL (ref 0–9)

## 2014-07-23 LAB — AMMONIA: Ammonia: 37 umol/L — ABNORMAL HIGH (ref 11–32)

## 2014-07-23 MED ORDER — SODIUM CHLORIDE 0.9 % IV BOLUS (SEPSIS)
1000.0000 mL | Freq: Once | INTRAVENOUS | Status: AC
  Administered 2014-07-23: 1000 mL via INTRAVENOUS

## 2014-07-23 MED ORDER — LOSARTAN POTASSIUM 50 MG PO TABS
100.0000 mg | ORAL_TABLET | Freq: Every day | ORAL | Status: DC
  Administered 2014-07-24 – 2014-07-28 (×5): 100 mg via ORAL
  Filled 2014-07-23 (×6): qty 2

## 2014-07-23 MED ORDER — ONDANSETRON HCL 4 MG/2ML IJ SOLN
4.0000 mg | Freq: Four times a day (QID) | INTRAMUSCULAR | Status: DC | PRN
  Administered 2014-07-25 – 2014-07-26 (×3): 4 mg via INTRAVENOUS
  Filled 2014-07-23 (×4): qty 2

## 2014-07-23 MED ORDER — HEPARIN SODIUM (PORCINE) 5000 UNIT/ML IJ SOLN
5000.0000 [IU] | Freq: Three times a day (TID) | INTRAMUSCULAR | Status: DC
  Administered 2014-07-23 – 2014-07-28 (×14): 5000 [IU] via SUBCUTANEOUS
  Filled 2014-07-23 (×20): qty 1

## 2014-07-23 MED ORDER — NALOXONE HCL 0.4 MG/ML IJ SOLN
0.4000 mg | Freq: Once | INTRAMUSCULAR | Status: AC
  Administered 2014-07-23: 0.4 mg via INTRAVENOUS
  Filled 2014-07-23: qty 1

## 2014-07-23 MED ORDER — SODIUM CHLORIDE 0.9 % IV SOLN
INTRAVENOUS | Status: DC
  Administered 2014-07-23 – 2014-07-24 (×3): via INTRAVENOUS
  Administered 2014-07-24: 1000 mL via INTRAVENOUS

## 2014-07-23 MED ORDER — KETOROLAC TROMETHAMINE 15 MG/ML IJ SOLN
15.0000 mg | Freq: Four times a day (QID) | INTRAMUSCULAR | Status: DC | PRN
  Administered 2014-07-24: 15 mg via INTRAVENOUS
  Filled 2014-07-23 (×2): qty 1

## 2014-07-23 MED ORDER — CARVEDILOL 3.125 MG PO TABS
3.1250 mg | ORAL_TABLET | Freq: Two times a day (BID) | ORAL | Status: DC
  Administered 2014-07-24 – 2014-07-28 (×9): 3.125 mg via ORAL
  Filled 2014-07-23 (×11): qty 1

## 2014-07-23 MED ORDER — ONDANSETRON HCL 4 MG PO TABS
4.0000 mg | ORAL_TABLET | Freq: Four times a day (QID) | ORAL | Status: DC | PRN
  Administered 2014-07-25 – 2014-07-27 (×3): 4 mg via ORAL
  Filled 2014-07-23 (×3): qty 1

## 2014-07-23 MED ORDER — SODIUM CHLORIDE 0.9 % IJ SOLN
3.0000 mL | Freq: Two times a day (BID) | INTRAMUSCULAR | Status: DC
  Administered 2014-07-23 – 2014-07-26 (×3): 3 mL via INTRAVENOUS

## 2014-07-23 NOTE — ED Notes (Signed)
Pt began to have snoring respirations. Pt placed on 2L oxygen for sats at 88%. MD aware. Pt responds to voice and touch however difficult to maintain attention. Pt not able to follow commands.

## 2014-07-23 NOTE — ED Provider Notes (Signed)
CSN: [id number]     Arrival date & time 07/23/14  1502 History   First MD Initiated Contact with Patient 07/23/14 1527     Chief Complaint  Patient presents with  . Altered Mental Status     (Consider location/radiation/quality/duration/timing/severity/associated sxs/prior Treatment) HPI  68 year old female comes from home with altered mental status over the last 2 days. Patient has been unable to hold a conversation per family and saying things that'll make sense. At this point the patient is sleepy and when aroused answer some yes and no questions but is not able to provide a full history. 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 Family endorses that patient has been progressively less responsive, has been stumbling and seeming off balance, and has had some urinary incontinence. It is unknown if patient has been taking more of her Vicodin's than normal.  Past Medical History  Diagnosis Date  . Depression   . Anxiety   . Hypertension   . MVP (mitral valve prolapse)   . Hydrocephalus   . Diabetes mellitus without complication   . Sinus bradycardia   . Dizziness   . Falls   . Acute kidney injury   . Transaminitis   . Hypotension   . Acid reflux disease    Past Surgical History  Procedure Laterality Date  . Cholecystectomy    . Brain surgery    . Ercp N/A 02/28/2014    Procedure: ENDOSCOPIC RETROGRADE CHOLANGIOPANCREATOGRAPHY (ERCP);  Surgeon: Gatha Mayer, MD;  Location: [facility] ENDOSCOPY;  Service: Endoscopy;  Laterality: N/A;  talked to tiffany from or /ebp  . Esophagogastroduodenoscopy N/A 02/28/2014    Procedure: ESOPHAGOGASTRODUODENOSCOPY (EGD);  Surgeon: Gatha Mayer, MD;  Location: [facility] ENDOSCOPY;  Service: Endoscopy;  Laterality: N/A;  . Ventriculoperitoneal shunt      right occipital   Family History  Problem Relation Age of Onset  . Heart disease Mother   . Stroke Mother   . Alcohol abuse Father   . Diabetes Father   . Cancer Neg Hx   . Hypertension Mother     History  Substance Use Topics  . Smoking status: Never Smoker   . Smokeless tobacco: Never Used  . Alcohol Use: No   OB History    No data available     Review of Systems  Unable to perform ROS: Mental status change      Allergies  Metformin and related; Ace inhibitors; Erythromycin; and Penicillins  Home Medications   Prior to Admission medications   Medication Sig Start Date End Date Taking? Authorizing Provider  busPIRone (BUSPAR) 7.5 MG tablet Take 1 tablet (7.5 mg total) by mouth 3 (three) times daily. 06/10/14   Mauricio Po, FNP  carvedilol (COREG) 3.125 MG tablet  05/08/14   Historical Provider, MD  diclofenac sodium (VOLTAREN) 1 % GEL Apply 4 g topically 4 (four) times daily. 07/22/14   Janith Lima, MD  gabapentin (NEURONTIN) 300 MG capsule Take 300 mg by mouth 3 (three) times daily.     Historical Provider, MD  HYDROcodone-acetaminophen (NORCO/VICODIN) 5-325 MG per tablet Take one tablet by mouth every 6 hours as needed for moderate pain 07/18/14   Janith Lima, MD  Linaclotide [facility]) 145 MCG CAPS capsule Take 1 capsule (145 mcg total) by mouth daily. 05/22/14   Janith Lima, MD  LORazepam (ATIVAN) 1 MG tablet Take 1 tablet (1 mg total) by mouth every 6 (six) hours as needed. For anxiousness 04/05/14   [name]  L Reed, DO  losartan (COZAAR) 100 MG tablet Take 100 mg by mouth daily.    Historical Provider, MD  pantoprazole (PROTONIX) 40 MG tablet  05/08/14   Historical Provider, MD  potassium chloride (KLOR-CON) 20 MEQ packet Take 20 mEq by mouth daily.    Historical Provider, MD  promethazine (PHENERGAN) 12.5 MG tablet Take 1 tablet (12.5 mg total) by mouth every 6 (six) hours as needed for nausea or vomiting. 05/28/14   Janith Lima, MD  solifenacin (VESICARE) 10 MG tablet Take 1 tablet (10 mg total) by mouth daily. 07/18/14   Janith Lima, MD  Vortioxetine HBr (BRINTELLIX) 10 MG TABS Take 10 mg by mouth every morning.     Historical Provider, MD   BP  95/51 mmHg  Pulse 60  Temp(Src) 98.1 F (36.7 C) (Oral)  Resp 17  SpO2 96% Physical Exam  Constitutional: She appears well-developed and well-nourished. She appears lethargic.  HENT:  Head: Normocephalic and atraumatic.  Right Ear: External ear normal.  Left Ear: External ear normal.  Nose: Nose normal.  Eyes: Pupils are equal, round, and reactive to light. Right eye exhibits no discharge. Left eye exhibits no discharge.  Myotic pupils bilaterally  Neck: Neck supple.  Cardiovascular: Normal rate and regular rhythm.   Murmur heard. Pulmonary/Chest: Effort normal and breath sounds normal. She has no wheezes.  Abdominal: Soft. She exhibits no distension. There is no tenderness.  Neurological: She appears lethargic. She is disoriented. GCS eye subscore is 3. GCS verbal subscore is 4. GCS motor subscore is 6.  Grossly normal strength in all 4 extremities  Skin: Skin is warm and dry.  Nursing note and vitals reviewed.   ED Course  Procedures (including critical care time) Labs Review Labs Reviewed  COMPREHENSIVE METABOLIC PANEL - Abnormal; Notable for the following:    Glucose, Bld 169 (*)    Albumin 3.1 (*)    AST 156 (*)    ALT 69 (*)    GFR calc non Af Amer 54 (*)    GFR calc Af Amer 63 (*)    All other components within normal limits  PROTIME-INR - Abnormal; Notable for the following:    Prothrombin Time 15.9 (*)    All other components within normal limits  URINALYSIS, ROUTINE W REFLEX MICROSCOPIC - Abnormal; Notable for the following:    Color, Urine AMBER (*)    Bilirubin Urine SMALL (*)    Leukocytes, UA TRACE (*)    All other components within normal limits  AMMONIA - Abnormal; Notable for the following:    Ammonia 37 (*)    All other components within normal limits  URINE MICROSCOPIC-ADD ON - Abnormal; Notable for the following:    Squamous Epithelial / LPF FEW (*)    Casts GRANULAR CAST (*)    All other components within normal limits  URINE RAPID DRUG SCREEN  (HOSP PERFORMED) - Abnormal; Notable for the following:    Opiates POSITIVE (*)    Benzodiazepines POSITIVE (*)    All other components within normal limits  CREATININE, SERUM - Abnormal; Notable for the following:    GFR calc non Af Amer 68 (*)    GFR calc Af Amer 79 (*)    All other components within normal limits  GLUCOSE, CAPILLARY - Abnormal; Notable for the following:    Glucose-Capillary 143 (*)    All other components within normal limits  I-STAT CG4 LACTIC ACID, ED - Abnormal; Notable for the following:  Lactic Acid, Venous 3.10 (*)    All other components within normal limits  CBG MONITORING, ED - Abnormal; Notable for the following:    Glucose-Capillary 162 (*)    All other components within normal limits  I-STAT ARTERIAL BLOOD GAS, ED - Abnormal; Notable for the following:    pH, Arterial 7.340 (*)    pCO2 arterial 46.4 (*)    pO2, Arterial 78.0 (*)    Bicarbonate 24.9 (*)    All other components within normal limits  CULTURE, BLOOD (ROUTINE X 2)  CULTURE, BLOOD (ROUTINE X 2)  CBC WITH DIFFERENTIAL  ETHANOL  CBC  COMPREHENSIVE METABOLIC PANEL  CBC    Imaging Review Ct Head Wo Contrast  07/23/2014   CLINICAL DATA:  68 year old female with altered mental status for several days, worsening. History of VP shunt. Initial encounter.  EXAM: CT HEAD WITHOUT CONTRAST  TECHNIQUE: Contiguous axial images were obtained from the base of the skull through the vertex without intravenous contrast.  COMPARISON:  04/02/2014 and earlier.  FINDINGS: Visualized paranasal sinuses and mastoids are clear. No acute osseous abnormality identified. No acute orbit or scalp soft tissue findings. Chronic right posterior approach shunt with stable appearance of the reservoir and visible shunt tubing descending in the right posterior neck.  Calcified atherosclerosis at the skull base. Stable configuration of the intracranial portion of the ventriculostomy catheter terminating at the septum callosum.  Mildly smaller ventricles. Subtle increased extra-axial fluid bilaterally (series 2, image 15). No associated mass effect or midline shift.  No evidence of intracranial mass lesion. No acute intracranial hemorrhage identified. No evidence of cortically based acute infarction identified. Gray-white matter differentiation is within normal limits throughout the brain. No suspicious intracranial vascular hyperdensity.  IMPRESSION: 1. Stable CSF shunt. Mildly smaller ventricles since September, associated with subtle bilateral extra-axial fluid, such that it is difficult to exclude mild over-shunting. 2. Otherwise negative for age noncontrast CT appearance of the brain.   Electronically Signed   By: Lars Pinks M.D.   On: 07/23/2014 16:28   Dg Chest Port 1 View  07/23/2014   CLINICAL DATA:  Slurred speech, nonresponsive  EXAM: PORTABLE CHEST - 1 VIEW  COMPARISON:  03/28/2014  FINDINGS: Cardiomegaly again noted. Stable chronic interstitial prominence and mild bronchitic changes. No segmental infiltrate or pulmonary edema. Right VP shunt catheter partially visualized.  IMPRESSION: No segmental infiltrate or pulmonary edema. Stable chronic interstitial prominence and bronchitic changes. Cardiomegaly again noted.   Electronically Signed   By: Lahoma Crocker M.D.   On: 07/23/2014 16:05     EKG Interpretation   Date/Time:  Tuesday July 23 2014 15:05:57 EST Ventricular Rate:  65 PR Interval:  178 QRS Duration: 86 QT Interval:  496 QTC Calculation: 515 R Axis:   -14 Text Interpretation:  Normal sinus rhythm Left ventricular hypertrophy  Prolonged QT Abnormal ECG No significant change since last tracing  Confirmed by Neeya Prigmore  MD, Beuford Garcilazo ([id number]) on 07/23/2014 3:57:06 PM      MDM   Final diagnoses:  Altered mental status, unspecified altered mental status type    After a workup in the ER it seems that the patient's altered mental status is most likely related to polypharmacy and oversedation with  narcotics. Small doses of Narcan seemed to arouse the patient more and she was more responsive. There is no obvious infection. No elevated temperature or white blood cell count. CT head unremarkable. There are surgical consult it, they will see patient. Medicine to admit the patient to  stepdown for close observation. She is somnolent but is protecting her airway, no hypoxia, no apnea or significantly decreased breathing to suggest needing intubation at this time.    Ephraim Hamburger, MD 07/23/14 [phone]

## 2014-07-23 NOTE — ED Notes (Signed)
MD at bedside. 

## 2014-07-23 NOTE — ED Notes (Signed)
Admitting MD at bedside.

## 2014-07-23 NOTE — ED Notes (Signed)
Pt transported with this RN to CT on 2L oxygen

## 2014-07-23 NOTE — ED Notes (Signed)
Pt family reports that she has been altered since Sunday and has been getting worse. Pt is unable to hold a conversation, and talking about things that do not make since. Pt has a VP shunt.

## 2014-07-23 NOTE — H&P (Signed)
[facility] History and Physical  [name] XIP:[id number] DOB: [birth year] DOA: 07/23/2014  Referring physician: Emergency Department PCP: [name], MD  Specialists:   Chief Complaint: Mental Status Change  HPI: [name] is a 68 y.o. female  With a hx of VP shunt, depression, chronic pain on opiates, DM who presents to the ED with several days of worsening mental status. Pt is lethargic so am unable to obtain hx from pt. Family is at bedside who reports that pt has been complaining of worse knee pains and subsequently has been taking a larger amount to Vicodin over the past 3-4 days. In the ED, pt's mentation slightly improved with narcan. Hospitalist service consulted for consideration for admission.  Review of Systems: Per above, the remainder of the 10pt ros reviewed and are neg  Past Medical History  Diagnosis Date  . Depression   . Anxiety   . Hypertension   . MVP (mitral valve prolapse)   . Hydrocephalus   . Diabetes mellitus without complication   . Sinus bradycardia   . Dizziness   . Falls   . Acute kidney injury   . Transaminitis   . Hypotension   . Acid reflux disease    Past Surgical History  Procedure Laterality Date  . Cholecystectomy    . Brain surgery    . Ercp N/A 02/28/2014    Procedure: ENDOSCOPIC RETROGRADE CHOLANGIOPANCREATOGRAPHY (ERCP);  Surgeon: Gatha Mayer, MD;  Location: [facility] ENDOSCOPY;  Service: Endoscopy;  Laterality: N/A;  talked to tiffany from or /ebp  . Esophagogastroduodenoscopy N/A 02/28/2014    Procedure: ESOPHAGOGASTRODUODENOSCOPY (EGD);  Surgeon: Gatha Mayer, MD;  Location: [facility] ENDOSCOPY;  Service: Endoscopy;  Laterality: N/A;  . Ventriculoperitoneal shunt      right occipital   Social History:  reports that she has never smoked. She has never used smokeless tobacco. She reports that she does not drink alcohol or use illicit drugs.  where does patient live--home, ALF, SNF? and with whom if at home?  Can  patient participate in ADLs?  Allergies  Allergen Reactions  . Metformin And Related Diarrhea    (takes metformin at home w/ meals)  . Ace Inhibitors Other (See Comments)    unknown  . Erythromycin Hives  . Penicillins Hives    Family History  Problem Relation Age of Onset  . Heart disease Mother   . Stroke Mother   . Alcohol abuse Father   . Diabetes Father   . Cancer Neg Hx   . Hypertension Mother     (be sure to complete)  Prior to Admission medications   Medication Sig Start Date End Date Taking? Authorizing Provider  busPIRone (BUSPAR) 7.5 MG tablet Take 1 tablet (7.5 mg total) by mouth 3 (three) times daily. 06/10/14   Mauricio Po, FNP  carvedilol (COREG) 3.125 MG tablet  05/08/14   Historical Provider, MD  diclofenac sodium (VOLTAREN) 1 % GEL Apply 4 g topically 4 (four) times daily. 07/22/14   Janith Lima, MD  gabapentin (NEURONTIN) 300 MG capsule Take 300 mg by mouth 3 (three) times daily.     Historical Provider, MD  HYDROcodone-acetaminophen (NORCO/VICODIN) 5-325 MG per tablet Take one tablet by mouth every 6 hours as needed for moderate pain 07/18/14   Janith Lima, MD  Linaclotide [facility]) 145 MCG CAPS capsule Take 1 capsule (145 mcg total) by mouth daily. 05/22/14   Janith Lima, MD  LORazepam (ATIVAN) 1 MG tablet Take 1 tablet (1  mg total) by mouth every 6 (six) hours as needed. For anxiousness 04/05/14   [name] L Reed, DO  losartan (COZAAR) 100 MG tablet Take 100 mg by mouth daily.    Historical Provider, MD  pantoprazole (PROTONIX) 40 MG tablet  05/08/14   Historical Provider, MD  potassium chloride (KLOR-CON) 20 MEQ packet Take 20 mEq by mouth daily.    Historical Provider, MD  promethazine (PHENERGAN) 12.5 MG tablet Take 1 tablet (12.5 mg total) by mouth every 6 (six) hours as needed for nausea or vomiting. 05/28/14   Janith Lima, MD  solifenacin (VESICARE) 10 MG tablet Take 1 tablet (10 mg total) by mouth daily. 07/18/14   Janith Lima, MD   Vortioxetine HBr (BRINTELLIX) 10 MG TABS Take 10 mg by mouth every morning.     Historical Provider, MD   Physical Exam: Filed Vitals:   07/23/14 1630 07/23/14 1700 07/23/14 1715 07/23/14 1730  BP: 88/43 99/68 106/62 110/70  Pulse: 132 102 58   Temp:      TempSrc:      Resp: 16 16 14 15   SpO2: 100% 100% 98% 100%     General:  Lethargic, but easily arousable to painful stimuli, in nad  Eyes: PERRL B  ENT: membranes moist, dentition fair  Neck: trachea midline, neck supple  Cardiovascular: regular, s1, s2  Respiratory: normal resp effort, no wheezing  Abdomen: soft,nondistended  Skin: normal skin turgor, no abnormal skin lesions seen  Musculoskeletal: perfused, no clubbing  Psychiatric: lethargic  Neurologic: cn2-12 grossly intact, strength/sensation intact  Labs on Admission:  Basic Metabolic Panel:  Recent Labs Lab 07/23/14 1520  NA 140  K 4.2  CL 104  CO2 24  GLUCOSE 169*  BUN 16  CREATININE 1.04  CALCIUM 9.0   Liver Function Tests:  Recent Labs Lab 07/23/14 1520  AST 156*  ALT 69*  ALKPHOS 109  BILITOT 1.2  PROT 6.5  ALBUMIN 3.1*   No results for input(s): LIPASE, AMYLASE in the last 168 hours.  Recent Labs Lab 07/23/14 1520  AMMONIA 37*   CBC:  Recent Labs Lab 07/23/14 1520  WBC 5.6  NEUTROABS 4.0  HGB 12.3  HCT 38.6  MCV 92.6  PLT 232   Cardiac Enzymes: No results for input(s): CKTOTAL, CKMB, CKMBINDEX, TROPONINI in the last 168 hours.  BNP (last 3 results) No results for input(s): PROBNP in the last 8760 hours. CBG:  Recent Labs Lab 07/23/14 1533  GLUCAP 162*    Radiological Exams on Admission: Ct Head Wo Contrast  07/23/2014   CLINICAL DATA:  68 year old female with altered mental status for several days, worsening. History of VP shunt. Initial encounter.  EXAM: CT HEAD WITHOUT CONTRAST  TECHNIQUE: Contiguous axial images were obtained from the base of the skull through the vertex without intravenous contrast.   COMPARISON:  04/02/2014 and earlier.  FINDINGS: Visualized paranasal sinuses and mastoids are clear. No acute osseous abnormality identified. No acute orbit or scalp soft tissue findings. Chronic right posterior approach shunt with stable appearance of the reservoir and visible shunt tubing descending in the right posterior neck.  Calcified atherosclerosis at the skull base. Stable configuration of the intracranial portion of the ventriculostomy catheter terminating at the septum callosum. Mildly smaller ventricles. Subtle increased extra-axial fluid bilaterally (series 2, image 15). No associated mass effect or midline shift.  No evidence of intracranial mass lesion. No acute intracranial hemorrhage identified. No evidence of cortically based acute infarction identified. Gray-white matter differentiation is within  normal limits throughout the brain. No suspicious intracranial vascular hyperdensity.  IMPRESSION: 1. Stable CSF shunt. Mildly smaller ventricles since September, associated with subtle bilateral extra-axial fluid, such that it is difficult to exclude mild over-shunting. 2. Otherwise negative for age noncontrast CT appearance of the brain.   Electronically Signed   By: Lars Pinks M.D.   On: 07/23/2014 16:28   Dg Chest Port 1 View  07/23/2014   CLINICAL DATA:  Slurred speech, nonresponsive  EXAM: PORTABLE CHEST - 1 VIEW  COMPARISON:  03/28/2014  FINDINGS: Cardiomegaly again noted. Stable chronic interstitial prominence and mild bronchitic changes. No segmental infiltrate or pulmonary edema. Right VP shunt catheter partially visualized.  IMPRESSION: No segmental infiltrate or pulmonary edema. Stable chronic interstitial prominence and bronchitic changes. Cardiomegaly again noted.   Electronically Signed   By: Lahoma Crocker M.D.   On: 07/23/2014 16:05    Assessment/Plan Principal Problem:   Altered mental status Active Problems:   Vitamin B12 deficiency anemia   HYDROCEPHALUS, NORMAL PRESSURE    Essential hypertension   1. Mental status change 1. Unclear etiology but suspect polypharmacy 2. Pt seemed to improve briefly with narcan given in ED 3. ABG ordered in ED 4. Avoid sedating meds 5. Admit to stepdown for now given lack of responsiveness 2. DM 1. Cont on SSI coverage 3. Hx hydrocephalus with VP shunt in place 1. ED to discuss with Neurosurg for any further recs re: shunt 2. CT head with stable CSF shunt with mildly smaller ventricles since 9/15 4. HTN 1. Initially hypotensive with bp improved with IVF 2. Will cont home meds 5. Chronic pain 1. Avoid sedating meds per above 2. Cont on PRN toradol for now 6. DVT prophylaxis 1. Heparin subq  Code Status: Full  Family Communication: Pt in room  Disposition Plan: Pending   Time spent: 49min  Denaya Horn, [facility] Pager [phone]  If 7PM-7AM, please contact night-coverage [email or web address] Password [facility] 07/23/2014, 5:56 PM

## 2014-07-23 NOTE — ED Notes (Signed)
Pt now snoring and lethargic. Responds to touch. MD at bedside.

## 2014-07-23 NOTE — ED Notes (Signed)
NOTIFIED DR. PICKERING IN PERSON FOR PATIENTS PANIC LAB RESULTS OF CG4+ LACTIC ACID =3.10 mmol/L @16 :17 PM ,07/23/2014.

## 2014-07-23 NOTE — ED Notes (Signed)
Pt more responsive and able to follow commands at this time.

## 2014-07-23 NOTE — Consult Note (Signed)
Reason for Consult: Rule out shunt malfunction Referring Physician: Dr. Kalman Jewels  [name] is an 68 y.o. female.  HPI: Patient is a 68 year old individual who had a ventriculoperitoneal shunt placed several years ago Dr. Trenton Gammon. She presents with about a 3 day history of progressive mental status changes as noted by family members. She apparently has been complaining of pain there is some question of her using more pain medication in recent days. Today she was disoriented at home and her family contacted the office. The office advised that she be seen in emergency department. On arrival here it appears that her neurologic status improves significantly when given some Narcan. A CT scan of the brain was performed and this demonstrates normal size ventricles and possibly even a slight amount of subdural fluid posteriorly suggesting possibly over shunting nonetheless there is no evidence of any intracranial pressure state.  Past Medical History  Diagnosis Date  . Depression   . Anxiety   . Hypertension   . MVP (mitral valve prolapse)   . Hydrocephalus   . Diabetes mellitus without complication   . Sinus bradycardia   . Dizziness   . Falls   . Acute kidney injury   . Transaminitis   . Hypotension   . Acid reflux disease     Past Surgical History  Procedure Laterality Date  . Cholecystectomy    . Brain surgery    . Ercp N/A 02/28/2014    Procedure: ENDOSCOPIC RETROGRADE CHOLANGIOPANCREATOGRAPHY (ERCP);  Surgeon: Kiara Mayer, MD;  Location: [facility] ENDOSCOPY;  Service: Endoscopy;  Laterality: N/A;  talked to tiffany from or /ebp  . Esophagogastroduodenoscopy N/A 02/28/2014    Procedure: ESOPHAGOGASTRODUODENOSCOPY (EGD);  Surgeon: Kiara Mayer, MD;  Location: [facility] ENDOSCOPY;  Service: Endoscopy;  Laterality: N/A;  . Ventriculoperitoneal shunt      right occipital    Family History  Problem Relation Age of Onset  . Heart disease Mother   . Stroke Mother   . Alcohol abuse Father    . Diabetes Father   . Cancer Neg Hx   . Hypertension Mother     Social History:  reports that she has never smoked. She has never used smokeless tobacco. She reports that she does not drink alcohol or use illicit drugs.  Allergies:  Allergies  Allergen Reactions  . Metformin And Related Diarrhea    (takes metformin at home w/ meals)  . Ace Inhibitors Other (See Comments)    unknown  . Erythromycin Hives  . Penicillins Hives    Medications: I have reviewed the patient's current medications.  Results for orders placed or performed during the hospital encounter of 07/23/14 (from the past 48 hour(s))  CBC with Differential     Status: None   Collection Time: 07/23/14  3:20 PM  Result Value Ref Range   WBC 5.6 4.0 - 10.5 K/uL   RBC 4.17 3.87 - 5.11 MIL/uL   Hemoglobin 12.3 12.0 - 15.0 g/dL   HCT 38.6 36.0 - 46.0 %   MCV 92.6 78.0 - 100.0 fL   MCH 29.5 26.0 - 34.0 pg   MCHC 31.9 30.0 - 36.0 g/dL   RDW 14.7 11.5 - 15.5 %   Platelets 232 150 - 400 K/uL   Neutrophils Relative % 70 43 - 77 %   Neutro Abs 4.0 1.7 - 7.7 K/uL   Lymphocytes Relative 18 12 - 46 %   Lymphs Abs 1.0 0.7 - 4.0 K/uL   Monocytes Relative 7 3 -  12 %   Monocytes Absolute 0.4 0.1 - 1.0 K/uL   Eosinophils Relative 4 0 - 5 %   Eosinophils Absolute 0.2 0.0 - 0.7 K/uL   Basophils Relative 1 0 - 1 %   Basophils Absolute 0.0 0.0 - 0.1 K/uL  Comprehensive metabolic panel     Status: Abnormal   Collection Time: 07/23/14  3:20 PM  Result Value Ref Range   Sodium 140 135 - 145 mmol/L    Comment: Please note change in reference range.   Potassium 4.2 3.5 - 5.1 mmol/L    Comment: Please note change in reference range.   Chloride 104 96 - 112 mEq/L   CO2 24 19 - 32 mmol/L   Glucose, Bld 169 (H) 70 - 99 mg/dL   BUN 16 6 - 23 mg/dL   Creatinine, Ser 1.04 0.50 - 1.10 mg/dL   Calcium 9.0 8.4 - 10.5 mg/dL   Total Protein 6.5 6.0 - 8.3 g/dL   Albumin 3.1 (L) 3.5 - 5.2 g/dL   AST 156 (H) 0 - 37 U/L   ALT 69 (H) 0 -  35 U/L   Alkaline Phosphatase 109 39 - 117 U/L   Total Bilirubin 1.2 0.3 - 1.2 mg/dL   GFR calc non Af Amer 54 (L) >90 mL/min   GFR calc Af Amer 63 (L) >90 mL/min    Comment: (NOTE) The eGFR has been calculated using the CKD EPI equation. This calculation has not been validated in all clinical situations. eGFR's persistently <90 mL/min signify possible Chronic Kidney Disease.    Anion gap 12 5 - 15  Protime-INR     Status: Abnormal   Collection Time: 07/23/14  3:20 PM  Result Value Ref Range   Prothrombin Time 15.9 (H) 11.6 - 15.2 seconds   INR 1.26 0.00 - 1.49  Culture, blood (routine x 2)     Status: None (Preliminary result)   Collection Time: 07/23/14  3:20 PM  Result Value Ref Range   Specimen Description BLOOD LEFT ARM    Special Requests BOTTLES DRAWN AEROBIC ONLY 3CC    Culture PENDING    Report Status PENDING   Ammonia     Status: Abnormal   Collection Time: 07/23/14  3:20 PM  Result Value Ref Range   Ammonia 37 (H) 11 - 32 umol/L    Comment: Please note change in reference range.  CBG monitoring, ED     Status: Abnormal   Collection Time: 07/23/14  3:33 PM  Result Value Ref Range   Glucose-Capillary 162 (H) 70 - 99 mg/dL  Urinalysis, Routine w reflex microscopic     Status: Abnormal   Collection Time: 07/23/14  3:53 PM  Result Value Ref Range   Color, Urine AMBER (A) YELLOW    Comment: BIOCHEMICALS MAY BE AFFECTED BY COLOR   APPearance CLEAR CLEAR   Specific Gravity, Urine 1.023 1.005 - 1.030   pH 6.0 5.0 - 8.0   Glucose, UA NEGATIVE NEGATIVE mg/dL   Hgb urine dipstick NEGATIVE NEGATIVE   Bilirubin Urine SMALL (A) NEGATIVE   Ketones, ur NEGATIVE NEGATIVE mg/dL   Protein, ur NEGATIVE NEGATIVE mg/dL   Urobilinogen, UA 1.0 0.0 - 1.0 mg/dL   Nitrite NEGATIVE NEGATIVE   Leukocytes, UA TRACE (A) NEGATIVE  Urine microscopic-add on     Status: Abnormal   Collection Time: 07/23/14  3:53 PM  Result Value Ref Range   Squamous Epithelial / LPF FEW (A) RARE   WBC, UA  3-6 <3  WBC/hpf   RBC / HPF 0-2 <3 RBC/hpf   Bacteria, UA RARE RARE   Casts GRANULAR CAST (A) NEGATIVE  I-Stat CG4 Lactic Acid, ED     Status: Abnormal   Collection Time: 07/23/14  4:04 PM  Result Value Ref Range   Lactic Acid, Venous 3.10 (H) 0.5 - 2.2 mmol/L  Ethanol     Status: None   Collection Time: 07/23/14  5:34 PM  Result Value Ref Range   Alcohol, Ethyl (B) <5 0 - 9 mg/dL    Comment:        LOWEST DETECTABLE LIMIT FOR SERUM ALCOHOL IS 11 mg/dL FOR MEDICAL PURPOSES ONLY   I-Stat Arterial Blood Gas, ED - (order at [facility] and [facility] only)     Status: Abnormal   Collection Time: 07/23/14  6:31 PM  Result Value Ref Range   pH, Arterial 7.340 (L) 7.350 - 7.450   pCO2 arterial 46.4 (H) 35.0 - 45.0 mmHg   pO2, Arterial 78.0 (L) 80.0 - 100.0 mmHg   Bicarbonate 24.9 (H) 20.0 - 24.0 mEq/L   TCO2 26 0 - 100 mmol/L   O2 Saturation 94.0 %   Acid-base deficit 1.0 0.0 - 2.0 mmol/L   Patient temperature 99.4 F    Collection site RADIAL, ALLEN'S TEST ACCEPTABLE    Drawn by RT    Sample type ARTERIAL     Ct Head Wo Contrast  07/23/2014   CLINICAL DATA:  68 year old female with altered mental status for several days, worsening. History of VP shunt. Initial encounter.  EXAM: CT HEAD WITHOUT CONTRAST  TECHNIQUE: Contiguous axial images were obtained from the base of the skull through the vertex without intravenous contrast.  COMPARISON:  04/02/2014 and earlier.  FINDINGS: Visualized paranasal sinuses and mastoids are clear. No acute osseous abnormality identified. No acute orbit or scalp soft tissue findings. Chronic right posterior approach shunt with stable appearance of the reservoir and visible shunt tubing descending in the right posterior neck.  Calcified atherosclerosis at the skull base. Stable configuration of the intracranial portion of the ventriculostomy catheter terminating at the septum callosum. Mildly smaller ventricles. Subtle increased extra-axial fluid bilaterally (series 2, image  15). No associated mass effect or midline shift.  No evidence of intracranial mass lesion. No acute intracranial hemorrhage identified. No evidence of cortically based acute infarction identified. Gray-white matter differentiation is within normal limits throughout the brain. No suspicious intracranial vascular hyperdensity.  IMPRESSION: 1. Stable CSF shunt. Mildly smaller ventricles since September, associated with subtle bilateral extra-axial fluid, such that it is difficult to exclude mild over-shunting. 2. Otherwise negative for age noncontrast CT appearance of the brain.   Electronically Signed   By: Lars Pinks M.D.   On: 07/23/2014 16:28   Dg Chest Port 1 View  07/23/2014   CLINICAL DATA:  Slurred speech, nonresponsive  EXAM: PORTABLE CHEST - 1 VIEW  COMPARISON:  03/28/2014  FINDINGS: Cardiomegaly again noted. Stable chronic interstitial prominence and mild bronchitic changes. No segmental infiltrate or pulmonary edema. Right VP shunt catheter partially visualized.  IMPRESSION: No segmental infiltrate or pulmonary edema. Stable chronic interstitial prominence and bronchitic changes. Cardiomegaly again noted.   Electronically Signed   By: Lahoma Crocker M.D.   On: 07/23/2014 16:05    Review of Systems  Unable to perform ROS: mental acuity   Blood pressure 120/68, pulse 85, temperature 99.4 F (37.4 C), temperature source Rectal, resp. rate 14, SpO2 99 %. Physical Exam  Constitutional: She appears well-developed and well-nourished.  HENT:  Head: Normocephalic and atraumatic.  Eyes: Conjunctivae and EOM are normal. Pupils are equal, round, and reactive to light.  Neck: Normal range of motion. Neck supple.  Cardiovascular: Normal rate.   Respiratory: Effort normal.  GI: Soft.  Neurological:  Arouses to voice and will answer simple questions and maintain some consciousness during this period time. She is able to move all 4 extremities. She denies headache and had does not have a stiff neck her  pupils are 2 mm briskly reactive light and accommodation extraocular movements are intact.  The shunt is located in the right parietal-occipital region, it depresses and refills briskly. Neck is completely supple.    Assessment/Plan: Impression the patient has evidence of oversedation as a primary cause of her mental status changes. I agree with the plan to simply observe the patient withhold any further pain medication at the current time. I doubt that there is any evidence for shunt malfunction given the current exam current findings.  Yashar Inclan J 07/23/2014, 7:57 PM

## 2014-07-24 DIAGNOSIS — R4182 Altered mental status, unspecified: Secondary | ICD-10-CM

## 2014-07-24 LAB — COMPREHENSIVE METABOLIC PANEL
ALT: 55 U/L — ABNORMAL HIGH (ref 0–35)
AST: 121 U/L — ABNORMAL HIGH (ref 0–37)
Albumin: 2.4 g/dL — ABNORMAL LOW (ref 3.5–5.2)
Alkaline Phosphatase: 92 U/L (ref 39–117)
Anion gap: 5 (ref 5–15)
BUN: 12 mg/dL (ref 6–23)
CO2: 25 mmol/L (ref 19–32)
Calcium: 7.7 mg/dL — ABNORMAL LOW (ref 8.4–10.5)
Chloride: 107 mEq/L (ref 96–112)
Creatinine, Ser: 0.71 mg/dL (ref 0.50–1.10)
GFR calc Af Amer: >90 mL/min (ref >90)
GFR calc non Af Amer: 87 mL/min — ABNORMAL LOW (ref >90)
Glucose, Bld: 168 mg/dL — ABNORMAL HIGH (ref 70–99)
Potassium: 3.8 mmol/L (ref 3.5–5.1)
Sodium: 137 mmol/L (ref 135–145)
Total Bilirubin: 1.3 mg/dL — ABNORMAL HIGH (ref 0.3–1.2)
Total Protein: 5.1 g/dL — ABNORMAL LOW (ref 6.0–8.3)

## 2014-07-24 LAB — GLUCOSE, CAPILLARY
Glucose-Capillary: 142 mg/dL — ABNORMAL HIGH (ref 70–99)
Glucose-Capillary: 145 mg/dL — ABNORMAL HIGH (ref 70–99)
Glucose-Capillary: 146 mg/dL — ABNORMAL HIGH (ref 70–99)
Glucose-Capillary: 166 mg/dL — ABNORMAL HIGH (ref 70–99)
Glucose-Capillary: 169 mg/dL — ABNORMAL HIGH (ref 70–99)
Glucose-Capillary: 224 mg/dL — ABNORMAL HIGH (ref 70–99)

## 2014-07-24 LAB — CBC
HCT: 33.4 % — ABNORMAL LOW (ref 36.0–46.0)
Hemoglobin: 10.7 g/dL — ABNORMAL LOW (ref 12.0–15.0)
MCH: 30.5 pg (ref 26.0–34.0)
MCHC: 32 g/dL (ref 30.0–36.0)
MCV: 95.2 fL (ref 78.0–100.0)
Platelets: 161 10*3/uL (ref 150–400)
RBC: 3.51 MIL/uL — ABNORMAL LOW (ref 3.87–5.11)
RDW: 14.9 % (ref 11.5–15.5)
WBC: 3.1 10*3/uL — ABNORMAL LOW (ref 4.0–10.5)

## 2014-07-24 LAB — ACETAMINOPHEN LEVEL: Acetaminophen (Tylenol), Serum: <10 ug/mL — ABNORMAL LOW (ref 10–30)

## 2014-07-24 MED ORDER — OXYCODONE HCL 5 MG PO TABS
5.0000 mg | ORAL_TABLET | Freq: Four times a day (QID) | ORAL | Status: DC | PRN
  Administered 2014-07-25 – 2014-07-28 (×5): 5 mg via ORAL
  Filled 2014-07-24 (×5): qty 1

## 2014-07-24 MED ORDER — DICLOFENAC SODIUM 1 % TD GEL
4.0000 g | Freq: Four times a day (QID) | TRANSDERMAL | Status: DC
  Filled 2014-07-24: qty 100

## 2014-07-24 MED ORDER — DARIFENACIN HYDROBROMIDE ER 15 MG PO TB24
15.0000 mg | ORAL_TABLET | Freq: Every day | ORAL | Status: DC
Start: 2014-07-24 — End: 2014-07-28
  Administered 2014-07-24 – 2014-07-28 (×5): 15 mg via ORAL
  Filled 2014-07-24 (×6): qty 1

## 2014-07-24 MED ORDER — WHITE PETROLATUM GEL
Status: AC
  Administered 2014-07-24: 11:00:00
  Filled 2014-07-24: qty 5

## 2014-07-24 MED ORDER — DICLOFENAC SODIUM 1 % TD GEL
4.0000 g | Freq: Four times a day (QID) | TRANSDERMAL | Status: DC | PRN
  Administered 2014-07-24: 4 g via TOPICAL

## 2014-07-24 MED ORDER — LORAZEPAM 0.5 MG PO TABS
0.5000 mg | ORAL_TABLET | Freq: Two times a day (BID) | ORAL | Status: DC
  Administered 2014-07-24 (×2): 0.5 mg via ORAL
  Filled 2014-07-24 (×2): qty 1

## 2014-07-24 NOTE — Progress Notes (Signed)
Report called to [address] [place]. Patient transferred at 1500. Taken off telemetry per orders. Primary RN notified.
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
## 2014-07-24 NOTE — Progress Notes (Signed)
Utilization Review Completed.Rissa Turley T12/23/2015  

## 2014-07-24 NOTE — Evaluation (Signed)
Clinical/Bedside Swallow Evaluation Patient Details  Name: [name] MRN: [id number] Date of Birth: Sep 04, 1945  Today's Date: 07/24/2014 Time: 1030-1055 SLP Time Calculation (min) (ACUTE ONLY): 25 min  Past Medical History:  Past Medical History  Diagnosis Date  . Depression   . Anxiety   . Hypertension   . MVP (mitral valve prolapse)   . Hydrocephalus   . Diabetes mellitus without complication   . Sinus bradycardia   . Dizziness   . Falls   . Acute kidney injury   . Transaminitis   . Hypotension   . Acid reflux disease    Past Surgical History:  Past Surgical History  Procedure Laterality Date  . Cholecystectomy    . Brain surgery    . Ercp N/A 02/28/2014    Procedure: ENDOSCOPIC RETROGRADE CHOLANGIOPANCREATOGRAPHY (ERCP);  Surgeon: Gatha Mayer, MD;  Location: [facility] ENDOSCOPY;  Service: Endoscopy;  Laterality: N/A;  talked to tiffany from or /ebp  . Esophagogastroduodenoscopy N/A 02/28/2014    Procedure: ESOPHAGOGASTRODUODENOSCOPY (EGD);  Surgeon: Gatha Mayer, MD;  Location: [facility] ENDOSCOPY;  Service: Endoscopy;  Laterality: N/A;  . Ventriculoperitoneal shunt      right occipital   HPI:   68 y.o. female with a hx of VP shunt, depression, chronic pain on opiates, pt presented to the ED on 07/23/14 with several days of worsening mental status. Decreased LOA, so family is at bedside who reported that pt has been complaining of worse knee pain and subsequently has been taking a larger amount of Vicodin over the past 3-4 days. Pt received ST prior to admission from [facility] per pt   Assessment / Plan / Recommendation Clinical Impression   Pt without overt s/s of aspiration noted with consistencies including thin-solids; oropharyngeal swallow appears wfl; pt did c/o esophageal s/s such as: frequent vomiting and nausea during meals; hx of GERD and esophageal dilation (x3) per pt; pt stated she is currently taking OTC meds for GERD, but is not taking anything prescribed by  PCP.  Pt currently receiving ST with home health and this should continue if pt is able as moderate aphasia noted during session; as well as, cognitive deficits during BSE. No f/u for swallowing issues and Regular/thin diet recommended.    Aspiration Risk  None    Diet Recommendation Regular;Thin liquid   Liquid Administration via: Cup;Straw Medication Administration: Whole meds with liquid Supervision: Patient able to self feed Compensations: Slow rate;Small sips/bites Postural Changes and/or Swallow Maneuvers: Seated upright 90 degrees    Other  Recommendations   Needs SLE or continued ST with home health SLP  Follow Up Recommendations  Home health SLP;Other (comment) (SLE and tx if indicated)    Frequency and Duration     n/a   Pertinent Vitals/Pain Elevated BP intermittently    SLP Swallow Goals  N/A   Swallow Study Prior Functional Status   Home health ST provided    General Date of Onset: 07/23/14 HPI:  68 y.o. female with a hx of VP shunt, depression, chronic pain on opiates, pt presented to the ED on 07/23/14 with several days of worsening mental status. Decreased LOA, so family is at bedside who reported that pt has been complaining of worse knee pain and subsequently has been taking a larger amount of Vicodin over the past 3-4 days. Pt received ST prior to admission from [facility] per pt Type of Study: Bedside swallow evaluation Diet Prior to this Study: NPO Temperature Spikes Noted: No Respiratory Status: Room air Behavior/Cognition:  Alert;Cooperative;Confused (Aphasic) Oral Cavity - Dentition: Adequate natural dentition Self-Feeding Abilities: Able to feed self Patient Positioning: Upright in bed Baseline Vocal Quality: Clear Volitional Cough: Strong Volitional Swallow: Able to elicit    Oral/Motor/Sensory Function Overall Oral Motor/Sensory Function: Appears within functional limits for tasks assessed Labial ROM: Within Functional Limits Labial Symmetry:  Within Functional Limits Labial Strength: Within Functional Limits Labial Sensation: Within Functional Limits Lingual ROM: Within Functional Limits Lingual Symmetry: Within Functional Limits Lingual Strength: Within Functional Limits Lingual Sensation: Within Functional Limits Facial ROM: Within Functional Limits Facial Symmetry: Within Functional Limits Facial Strength: Within Functional Limits Facial Sensation: Within Functional Limits Velum: Within Functional Limits Mandible: Within Functional Limits   Ice Chips Ice chips: Not tested   Thin Liquid Thin Liquid: Within functional limits Presentation: Cup;Straw    Nectar Thick Nectar Thick Liquid: Not tested   Honey Thick Honey Thick Liquid: Not tested   Puree Puree: Within functional limits Presentation: Self Fed   Solid       Solid: Within functional limits Presentation: Self Fed       Spenser Wolf,PAT, M.S., CCC-SLP 07/24/2014,12:50 PM

## 2014-07-24 NOTE — Progress Notes (Signed)
Patient trasfered from 3S to 5W08 via wheelchair; alert and oriented x 4; no complaints of pain; IV saline locked in LAC and LAC; skin - MSAD groin area. . Orient patient to room and unit; instructed how to use the call bell and  fall risk precautions. Will continue to monitor the patient.

## 2014-07-24 NOTE — Progress Notes (Signed)
[place] TEAM 1 - Stepdown/ICU TEAM Progress Note  [name] VHQ:[id number] DOB: Apr 24, 1946 DOA: 07/23/2014 PCP: [name], MD  Admit HPI / Brief Narrative: 68 yo female with a VP shunt, depression, chronic pain on opiates, and DM who presented to the ED with several days of worsening mental status. Family reported that pt had been complaining of worse knee pains and subsequently has been taking a larger amount of Vicodin over 3-4 days. In the ED, pt's mentation slightly improved with narcan. Following admission, it was discovered that a total of 17 1mg  ativan tablets were missing from a Rx bottle filled ~24hrs prior to admission.    HPI/Subjective: Pt is mildly confused, but alert and conversant.  She does not remember the events leading to her hospitalization yesterday.  She denies cp, sob, n/v, or ha.  She does c/o severe constipation.    Assessment/Plan:  Altered mental status - accidental drug overdose (narcotic and benzo) CT head unrevealing - NS does not feel shunt is malfunctioning/infected - hx suggestive of overuse of narcotic and pill counts suggestive of overuse of ativan - resume low dose narcotic and benzo to avoid withdrawal, but pt will need significantly lower doses at d/c and will need to be cautioned again against using her medications more frequently than prescribed  Mild transaminates  Likely mild shock liver due to sedation, v/s effect of tylenol in pain med - tylenol level not checked at presentation, but LFTs are currently improving - recheck in AM   DM CBG reasonably controlled - follow trend   Equivocal UA F/u urine culture  Hx hydrocephalus w/ VP shunt  Checked by NS   HTN BP currently well controlled  Chronic pain See discussion above   Depression w/ anxiety disorder  MVP  Code Status: FULL Family Communication: no family present at time of exam Disposition Plan: transfer to med bed - POSSIBLE D/C HOME  12/24  Consultants: Neurosurgery  Procedures: none  Antibiotics: none  DVT prophylaxis: SCDs  Objective: Blood pressure 126/54, pulse 66, temperature 98.4 F (36.9 C), temperature source Oral, resp. rate 17, height 5\' 5"  (1.651 m), weight 104.3 kg (229 lb 15 oz), SpO2 97 %.  Intake/Output Summary (Last 24 hours) at 07/24/14 1202 Last data filed at 07/24/14 1100  Gross per 24 hour  Intake   1320 ml  Output      0 ml  Net   1320 ml   Exam: General: No acute respiratory distress Lungs: Clear to auscultation bilaterally without wheezes or crackles Cardiovascular: Regular rate and rhythm without murmur gallop or rub normal S1 and S2 Abdomen: Nontender, nondistended, soft, bowel sounds positive, no rebound, no ascites, no appreciable mass Extremities: No significant cyanosis, clubbing, or edema bilateral lower extremities  Data Reviewed: Basic Metabolic Panel:  Recent Labs Lab 07/23/14 1520 07/23/14 1937 07/24/14 0248  NA 140  --  137  K 4.2  --  3.8  CL 104  --  107  CO2 24  --  25  GLUCOSE 169*  --  168*  BUN 16  --  12  CREATININE 1.04 0.86 0.71  CALCIUM 9.0  --  7.7*    Liver Function Tests:  Recent Labs Lab 07/23/14 1520 07/24/14 0248  AST 156* 121*  ALT 69* 55*  ALKPHOS 109 92  BILITOT 1.2 1.3*  PROT 6.5 5.1*  ALBUMIN 3.1* 2.4*    Recent Labs Lab 07/23/14 1520  AMMONIA 37*   Coags:  Recent Labs Lab 07/23/14 1520  INR 1.26   CBC:  Recent Labs Lab 07/23/14 1520 07/23/14 1937 07/24/14 0248  WBC 5.6 5.1 3.1*  NEUTROABS 4.0  --   --   HGB 12.3 13.6 10.7*  HCT 38.6 40.7 33.4*  MCV 92.6 93.8 95.2  PLT 232 179 161   CBG:  Recent Labs Lab 07/23/14 1506 07/23/14 1533 07/23/14 2017 07/23/14 2349 07/24/14 0435  GLUCAP 146* 162* 143* 142* 166*    Recent Results (from the past 240 hour(s))  Culture, blood (routine x 2)     Status: None (Preliminary result)   Collection Time: 07/23/14  3:20 PM  Result Value Ref Range Status    Specimen Description BLOOD LEFT ARM  Final   Special Requests BOTTLES DRAWN AEROBIC ONLY 3CC  Final   Culture  Setup Time   Final    07/23/2014 21:17 Performed at [facility]    Culture   Final           BLOOD CULTURE RECEIVED NO GROWTH TO DATE CULTURE WILL BE HELD FOR 5 DAYS BEFORE ISSUING A FINAL NEGATIVE REPORT Performed at [facility]    Report Status PENDING  Incomplete  Culture, blood (routine x 2)     Status: None (Preliminary result)   Collection Time: 07/23/14  3:30 PM  Result Value Ref Range Status   Specimen Description BLOOD HAND RIGHT  Final   Special Requests BOTTLES DRAWN AEROBIC AND ANAEROBIC 5CC  Final   Culture  Setup Time   Final    07/23/2014 21:15 Performed at [facility]    Culture   Final           BLOOD CULTURE RECEIVED NO GROWTH TO DATE CULTURE WILL BE HELD FOR 5 DAYS BEFORE ISSUING A FINAL NEGATIVE REPORT Performed at [facility]    Report Status PENDING  Incomplete     Studies:  Recent x-ray studies have been reviewed in detail by the Attending Physician  Scheduled Meds:  Scheduled Meds: . carvedilol  3.125 mg Oral BID WC  . heparin  5,000 Units Subcutaneous 3 times per day  . losartan  100 mg Oral Daily  . sodium chloride  3 mL Intravenous Q12H  . white petrolatum        Time spent on care of this patient: 35 mins   Shun Pletz T , MD   [facility] Office  [phone] Pager - Text Page per Shea Evans as per below:  On-Call/Text Page:      [email or web address]      password [email or web address]  If 7PM-7AM, please contact night-coverage [email or web address] Password [email or web address] 07/24/2014, 12:02 PM   LOS: 1 day

## 2014-07-25 DIAGNOSIS — E876 Hypokalemia: Secondary | ICD-10-CM

## 2014-07-25 DIAGNOSIS — T50901A Poisoning by unspecified drugs, medicaments and biological substances, accidental (unintentional), initial encounter: Secondary | ICD-10-CM | POA: Diagnosis present

## 2014-07-25 DIAGNOSIS — T50901D Poisoning by unspecified drugs, medicaments and biological substances, accidental (unintentional), subsequent encounter: Secondary | ICD-10-CM

## 2014-07-25 LAB — COMPREHENSIVE METABOLIC PANEL
ALT: 47 U/L — ABNORMAL HIGH (ref 0–35)
AST: 89 U/L — ABNORMAL HIGH (ref 0–37)
Albumin: 2.2 g/dL — ABNORMAL LOW (ref 3.5–5.2)
Alkaline Phosphatase: 86 U/L (ref 39–117)
Anion gap: 6 (ref 5–15)
BUN: 6 mg/dL (ref 6–23)
CO2: 21 mmol/L (ref 19–32)
Calcium: 7.2 mg/dL — ABNORMAL LOW (ref 8.4–10.5)
Chloride: 114 mEq/L — ABNORMAL HIGH (ref 96–112)
Creatinine, Ser: 0.61 mg/dL (ref 0.50–1.10)
GFR calc Af Amer: >90 mL/min (ref >90)
GFR calc non Af Amer: >90 mL/min (ref >90)
Glucose, Bld: 113 mg/dL — ABNORMAL HIGH (ref 70–99)
Potassium: 3 mmol/L — ABNORMAL LOW (ref 3.5–5.1)
Sodium: 141 mmol/L (ref 135–145)
Total Bilirubin: 1.1 mg/dL (ref 0.3–1.2)
Total Protein: 5 g/dL — ABNORMAL LOW (ref 6.0–8.3)

## 2014-07-25 LAB — URINE CULTURE
Colony Count: NO GROWTH
Culture: NO GROWTH

## 2014-07-25 LAB — APTT: aPTT: 52 seconds — ABNORMAL HIGH (ref 24–37)

## 2014-07-25 LAB — FOLATE: Folate: 12.9 ng/mL

## 2014-07-25 LAB — PROTIME-INR
INR: 1.33 (ref 0.00–1.49)
Prothrombin Time: 16.6 seconds — ABNORMAL HIGH (ref 11.6–15.2)

## 2014-07-25 LAB — CBC
HCT: 34.7 % — ABNORMAL LOW (ref 36.0–46.0)
Hemoglobin: 11.1 g/dL — ABNORMAL LOW (ref 12.0–15.0)
MCH: 29.4 pg (ref 26.0–34.0)
MCHC: 32 g/dL (ref 30.0–36.0)
MCV: 91.8 fL (ref 78.0–100.0)
Platelets: 167 10*3/uL (ref 150–400)
RBC: 3.78 MIL/uL — ABNORMAL LOW (ref 3.87–5.11)
RDW: 15 % (ref 11.5–15.5)
WBC: 3.7 10*3/uL — ABNORMAL LOW (ref 4.0–10.5)

## 2014-07-25 LAB — GLUCOSE, CAPILLARY
Glucose-Capillary: 161 mg/dL — ABNORMAL HIGH (ref 70–99)
Glucose-Capillary: 145 mg/dL — ABNORMAL HIGH (ref 70–99)

## 2014-07-25 LAB — MAGNESIUM: Magnesium: 1.5 mg/dL (ref 1.5–2.5)

## 2014-07-25 LAB — AMMONIA: Ammonia: 36 umol/L — ABNORMAL HIGH (ref 11–32)

## 2014-07-25 LAB — VITAMIN B12: Vitamin B-12: 520 pg/mL (ref 211–911)

## 2014-07-25 MED ORDER — LORAZEPAM 1 MG PO TABS
1.0000 mg | ORAL_TABLET | Freq: Three times a day (TID) | ORAL | Status: DC
  Administered 2014-07-25 – 2014-07-28 (×9): 1 mg via ORAL
  Filled 2014-07-25 (×10): qty 1

## 2014-07-25 MED ORDER — PANTOPRAZOLE SODIUM 40 MG PO TBEC
40.0000 mg | DELAYED_RELEASE_TABLET | Freq: Every day | ORAL | Status: DC
  Administered 2014-07-25 – 2014-07-27 (×3): 40 mg via ORAL
  Filled 2014-07-25 (×4): qty 1

## 2014-07-25 MED ORDER — POTASSIUM CHLORIDE CRYS ER 20 MEQ PO TBCR
40.0000 meq | EXTENDED_RELEASE_TABLET | ORAL | Status: AC
  Administered 2014-07-25: 40 meq via ORAL
  Filled 2014-07-25 (×2): qty 2

## 2014-07-25 MED ORDER — HYDRALAZINE HCL 20 MG/ML IJ SOLN: 10.0000 mg | Freq: Four times a day (QID) | INTRAMUSCULAR | Status: DC | PRN

## 2014-07-25 MED ORDER — POTASSIUM CHLORIDE 20 MEQ/15ML (10%) PO SOLN
40.0000 meq | Freq: Once | ORAL | Status: AC
  Administered 2014-07-25: 40 meq via ORAL
  Filled 2014-07-25: qty 30

## 2014-07-25 MED ORDER — LINACLOTIDE 145 MCG PO CAPS
145.0000 ug | ORAL_CAPSULE | Freq: Every day | ORAL | Status: DC
  Administered 2014-07-25 – 2014-07-28 (×4): 145 ug via ORAL
  Filled 2014-07-25 (×4): qty 1

## 2014-07-25 MED ORDER — INSULIN ASPART 100 UNIT/ML ~~LOC~~ SOLN
0.0000 [IU] | Freq: Three times a day (TID) | SUBCUTANEOUS | Status: DC
  Administered 2014-07-25: 2 [IU] via SUBCUTANEOUS
  Administered 2014-07-26: 1 [IU] via SUBCUTANEOUS
  Administered 2014-07-26: 2 [IU] via SUBCUTANEOUS
  Administered 2014-07-27 (×3): 1 [IU] via SUBCUTANEOUS

## 2014-07-25 MED ORDER — ASPIRIN EC 325 MG PO TBEC
325.0000 mg | DELAYED_RELEASE_TABLET | Freq: Every day | ORAL | Status: DC
  Administered 2014-07-25 – 2014-07-28 (×4): 325 mg via ORAL
  Filled 2014-07-25 (×4): qty 1

## 2014-07-25 MED ORDER — ALUM & MAG HYDROXIDE-SIMETH 200-200-20 MG/5ML PO SUSP
30.0000 mL | Freq: Four times a day (QID) | ORAL | Status: DC | PRN
  Administered 2014-07-25: 30 mL via ORAL
  Filled 2014-07-25 (×2): qty 30

## 2014-07-25 NOTE — Care Management Note (Addendum)
    Page 1 of 1   07/28/2014     4:17:57 PM CARE MANAGEMENT NOTE 07/28/2014  Patient:  [name], [name]   Account Number:  [id number]  Date Initiated:  07/25/2014  Documentation initiated by:  Tomi Bamberger  Subjective/Objective Assessment:   dx ams, chronic pain, migraines  admit- from home, has 24 hr care at home.     Action/Plan:   Anticipated DC Date:  07/26/2014   Anticipated DC Plan:  [place]  CM consult      [facility] Choice  HOME HEALTH   Choice offered to / List presented to:  [name]        [place] arranged  [place]   Status of service:  Completed, signed off Medicare Important Message given?  YES (If response is "NO", the following Medicare IM given date fields will be blank) Date Medicare IM given:  07/25/2014 Medicare IM given by:  Tomi Bamberger Date Additional Medicare IM given:   Additional Medicare IM given by:    Discharge Disposition:  [place]  Per UR Regulation:  Reviewed for med. necessity/level of care/duration of stay  If discussed at [place] of Stay Meetings, dates discussed:    Comments:  07/28/14 14:45 CM spoke with pt who is set up with [facility] and will have HHPT/OT rendered by [facility].  Cm faxed facesheet, orders, and F2F to [facility].  No other CM needs were communicated.  Mariane Masters, BSN, Cm [phone].  07/14/23/15 [place], BSN [phone] patient is from home, she has 24 hr care at home.  Patient states she would like to work with Alvis Lemmings, referral made to [place], Bethena Roys notified.  Soc will begin 24-48 hrs post dc.

## 2014-07-25 NOTE — Progress Notes (Signed)
Lab Results WBC  Date/Time Value Ref Range Status  07/25/2014 06:20 AM 3.7* 4.0 - 10.5 K/uL Final  07/24/2014 02:48 AM 3.1* 4.0 - 10.5 K/uL Final  07/23/2014 07:37 PM 5.1 4.0 - 10.5 K/uL Final    Comment:    WHITE COUNT CONFIRMED ON SMEAR   NEUTROPHILS RELATIVE %  Date/Time Value Ref Range Status  07/23/2014 03:20 PM 70 43 - 77 % Final  04/10/2014 05:35 AM 51 43 - 77 % Final  04/01/2014 02:22 PM 66 43 - 77 % Final   PCO2 ARTERIAL  Date/Time Value Ref Range Status  07/23/2014 06:31 PM 46.4* 35.0 - 45.0 mmHg Final   LACTIC ACID, VENOUS  Date/Time Value Ref Range Status  07/23/2014 04:04 PM 3.10* 0.5 - 2.2 mmol/L Final  03/28/2014 11:20 PM 1.78 0.5 - 2.2 mmol/L Final  03/28/2014 12:50 PM 2.06 0.5 - 2.2 mmol/L Final   PCO2, VEN  Date/Time Value Ref Range Status  03/28/2014 11:20 PM 38.2* 45.0 - 50.0 mmHg Final    VSS - Blood pressure 170/74, pulse 137, temperature 98.4 F (36.9 C), temperature source Oral, resp. rate 18, height 5\' 5"  (1.651 m), weight 104.3 kg (229 lb 15 oz), SpO2 93 %., R/A.  Text paged Dr. Algis Liming, will continue to monitor and await for MD response.

## 2014-07-25 NOTE — Progress Notes (Signed)
Patient ID: [name], female   DOB: 1946-05-14, 68 y.o.   MRN: [id number] Sensorium has cleared. Shunt appears to be working fine Please reconsult [place] if necessary.

## 2014-07-25 NOTE — Progress Notes (Signed)
Speech Language Pathology    Patient Details Name: [name] MRN: [id number] DOB: 02-04-1946 Today's Date: 07/25/2014 Time:  -     Pt seen for bedside swallow assessment 12/23 without overt s/s aspiration; regular texture diet and thin liquids recommended.  Evaluating ST yesterday noted baseline aphasia and cognitive deficits which pt states she was receiving home health ST services.  Recommend continue home health ST in pt's environment at discharge.  ST will sign off.   Orbie Pyo Rollins.Ed [employer] [phone]

## 2014-07-25 NOTE — Progress Notes (Signed)
Occupational Therapy Evaluation Patient Details Name: [name] MRN: [id number] DOB: September 17, 1945 Today's Date: 07/25/2014    History of Present Illness 68 yo female with a VP shunt, depression, chronic pain on opiates, and DM who presented to the ED with several days of worsening mental status. Family reported that pt had been complaining of worse knee pains and subsequently has been taking a larger amount of Vicodin over 3-4 days. In the ED, pt's mentation slightly improved with narcan. Following admission, it was discovered that a total of 17 1mg  ativan tablets were missing from a Rx bottle filled ~24hrs prior to admission   Clinical Impression   Limited evaluation this date due to pt's unwillingness to get EOB/OOB. She reports she is participating in ADLs with nursing staff. Noted PT documentation of R visual field deficit, will need to assess further but pt not willing to participate in this assessment during our session. Recommend 24/7 S/A at discharge. If she does not have that, recommend SNF.    Follow Up Recommendations  Supervision/Assistance - 24 hour;Home health OT;SNF    Equipment Recommendations  None recommended by OT    Recommendations for Other Services       Precautions / Restrictions Precautions Precautions: Fall Restrictions Weight Bearing Restrictions: No      Mobility Bed Mobility                  Transfers                      Balance                                            ADL Overall ADL's : Needs assistance/impaired Eating/Feeding: Set up   Grooming: Wash/dry hands;Wash/dry face;Set up                                 General ADL Comments: Patient in bed, not agreeable to EOB/OOB activities. Reports she is getting up to [facility] with nursing assistance but is having problems with incontinence. Hearing impairment since 35s but worse in the past year. 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      Vision                     Perception     Praxis      Pertinent Vitals/Pain Pain Assessment: 0-10 Pain Score: 4  Pain Location: L knee Pain Descriptors / Indicators: Aching Pain Intervention(s): Monitored during session (pt reports she has told nurse about it)     Hand Dominance Right   Extremity/Trunk Assessment Upper Extremity Assessment Upper Extremity Assessment: Overall WFL for tasks assessed   Lower Extremity Assessment Lower Extremity Assessment: Defer to PT evaluation       Communication Communication Communication: Expressive difficulties (periods of freezing mid sentence)   Cognition Arousal/Alertness: Awake/alert Behavior During Therapy: WFL for tasks assessed/performed Overall Cognitive Status: No family/caregiver present to determine baseline cognitive functioning                     General Comments       Exercises       Shoulder Instructions      Home Living Family/patient expects to be discharged to:: Private residence Living  Arrangements: Non-relatives/Friends Available Help at Discharge: Friend(s);Available 24 hours/day Type of Home: House Home Access: Stairs to enter [employer] of Steps: 3 Entrance Stairs-Rails: Right Home Layout: One level     Bathroom Shower/Tub: Tub/shower unit;Curtain Shower/tub characteristics: Architectural technologist: Standard     Home Equipment: Environmental consultant - 2 wheels;Cane - single point;Shower seat   Additional Comments: Reports using cane at home only recently due to falls. Reports "I'm not mentally ready for a BSC" but states she has a shower chair that is in her friend's trunk and just needs to be set up.      Prior Functioning/Environment Level of Independence: Needs assistance  Gait / Transfers Assistance Needed: States she does not use assistive device for ambulation. Reports 6-7 falls recently. Has life alert.  ADL's /  Homemaking Assistance Needed: Reports her friend that stays with her cooks, does laundry, and cleans. Friend also assists with showers.        OT Diagnosis: Generalized weakness   OT Problem List: Impaired balance (sitting and/or standing);Impaired vision/perception;Decreased safety awareness   OT Treatment/Interventions: Self-care/ADL training;DME and/or AE instruction;Visual/perceptual remediation/compensation;Patient/family education;Therapeutic activities    OT Goals(Current goals can be found in the care plan section) Acute Rehab OT Goals Patient Stated Goal: to go home OT Goal Formulation: With patient Time For Goal Achievement: 08/08/14 Potential to Achieve Goals: Good  OT Frequency: Min 2X/week   Barriers to D/C: Decreased caregiver support (reports her caregiver occasionally lives with her fiancee)          Co-evaluation              End of Session Nurse Communication: Other (comment) (pt requests meds for heartburn)  Activity Tolerance: Patient limited by fatigue (would not get EOB/OOB during eval) Patient left: in bed;with call bell/phone within reach;with bed alarm set   Time: 0805-0820 OT Time Calculation (min): 15 min Charges:  OT General Charges $OT Visit: 1 Procedure OT Evaluation $Initial OT Evaluation Tier I: 1 Procedure OT Treatments $Self Care/Home Management : 8-22 mins G-Codes:    Kasheem Toner A 08/10/14, 9:18 AM

## 2014-07-25 NOTE — Progress Notes (Addendum)
Progress Note  [name] YDX:[id number] DOB: [birth year] DOA: 07/23/2014 PCP: [name], MD  Admit HPI / Brief Narrative: 68 yo female with a VP shunt, depression, chronic pain on opiates, and DM who presented to the ED with several days of worsening mental status. Family reported that pt had been complaining of worse knee pains and subsequently has been taking a larger amount of Vicodin over 3-4 days. In the ED, pt's mentation slightly improved with narcan. Following admission, it was discovered that a total of 17 1mg  ativan tablets were missing from a Rx bottle filled ~24hrs prior to admission.    HPI/Subjective: Patient states mild 4/10 pain in left knee and denies any other complaints. She is anxious to go home. As per patient's friend Ms. Robin at bedside, mental status has significantly improved but apparently had some stuttering and visual hallucinations yesterday. Patient also complains of heartburn.  Assessment/Plan:  Altered mental status - accidental drug overdose (narcotic and benzo) CT head unrevealing - NS does not feel shunt is malfunctioning/infected - hx suggestive of overuse of narcotic and pill counts suggestive of overuse of ativan - resume low dose narcotic and benzo to avoid withdrawal, but pt will need significantly lower doses at d/c and will need to be cautioned again against using her medications more frequently than prescribed - As discussed with friend, mental status has significantly improved. Agree with minimizing opioids. Patient usually takes lorazepam 1 mg 4 times a day dutifully daily for years. Would reduce dose gradually and will start with reducing by 25% of daily dose to 1 mg 3 times a day to avoid withdrawal seizures. Also counseled patient and her friend at bedside extensively that patient's medications need to be kept away from her and given to her by someone else because patient seems to forget that she has taken these medications and hence takes  them again.  Mild transaminates  Likely mild shock liver due to sedation, v/s effect of tylenol in pain med - tylenol level not checked at presentation, but LFTs are currently improving - recheck in AM   DM CBGs mildly uncontrolled and fluctuating. We'll place on NovoLog SSI.  Equivocal UA F/u urine culture  Hx hydrocephalus w/ VP shunt  As per neurosurgery, no acute events or intervention needed and have signed off 12/24  HTN Mildly uncontrolled. Continue carvedilol and ARB. Monitor closely  Chronic pain See discussion above   Depression w/ anxiety disorder - Patient on polypharmacy at home which also could contribute to altered mental status. Requested psychiatry consultation to optimize medications.  MVP  GERD Start PPI and when necessary Maalox  Hypokalemia Replace and follow BMP  Anemia and leukopenia - Stable. Follow CBCs  Code Status: FULL Family Communication: Discussed with patient's friend Ms. Robin at bedside. Ms. Virl Cagey daughter and daughter's boyfriend apparently stay with patient 24/7. Disposition Plan: Possible discharge home 12/25  Consultants: Neurosurgery  Procedures: none  Antibiotics: none  DVT prophylaxis: SCDs  Objective:  Filed Vitals:   07/25/14 0756 07/25/14 0952 07/25/14 1156 07/25/14 1328  BP: 152/73 170/74 151/126 163/99  Pulse: 72 137 82 85  Temp:  98.4 F (36.9 C) 98.3 F (36.8 C) 97.9 F (36.6 C)  TempSrc:  Oral Oral Oral  Resp:  18 18 18   Height:      Weight:      SpO2:  93% 92% 90%     Intake/Output Summary (Last 24 hours) at 07/25/14 1549 Last data filed at 07/25/14 1401  Gross  per 24 hour  Intake   1200 ml  Output    700 ml  Net    500 ml   Exam: General: No acute respiratory distress. Moderately built and obese female lying comfortably supine in bed. Lungs: Clear to auscultation bilaterally without wheezes or crackles. No increased work of breathing. Cardiovascular: Regular rate and rhythm without murmur  gallop or rub normal S1 and S2 Abdomen: Nontender, nondistended, soft, bowel sounds positive, no rebound, no ascites, no appreciable mass Extremities: No significant cyanosis, clubbing, or edema bilateral lower extremities CNS: Alert and oriented. No focal neurological deficits. No tremors noticed at this time or hallucinations. Psychiatric: Pleasant and appropriate.  Data Reviewed: Basic Metabolic Panel:  Recent Labs Lab 07/23/14 1520 07/23/14 1937 07/24/14 0248 07/25/14 0620  NA 140  --  137 141  K 4.2  --  3.8 3.0*  CL 104  --  107 114*  CO2 24  --  25 21  GLUCOSE 169*  --  168* 113*  BUN 16  --  12 6  CREATININE 1.04 0.86 0.71 0.61  CALCIUM 9.0  --  7.7* 7.2*  MG  --   --   --  1.5    Liver Function Tests:  Recent Labs Lab 07/23/14 1520 07/24/14 0248 07/25/14 0620  AST 156* 121* 89*  ALT 69* 55* 47*  ALKPHOS 109 92 86  BILITOT 1.2 1.3* 1.1  PROT 6.5 5.1* 5.0*  ALBUMIN 3.1* 2.4* 2.2*    Recent Labs Lab 07/23/14 1520 07/25/14 0615  AMMONIA 37* 36*   Coags:  Recent Labs Lab 07/23/14 1520 07/25/14 0620  INR 1.26 1.33   CBC:  Recent Labs Lab 07/23/14 1520 07/23/14 1937 07/24/14 0248 07/25/14 0620  WBC 5.6 5.1 3.1* 3.7*  NEUTROABS 4.0  --   --   --   HGB 12.3 13.6 10.7* 11.1*  HCT 38.6 40.7 33.4* 34.7*  MCV 92.6 93.8 95.2 91.8  PLT 232 179 161 167   CBG:  Recent Labs Lab 07/23/14 2349 07/24/14 0435 07/24/14 0832 07/24/14 1212 07/24/14 1743  GLUCAP 142* 166* 145* 169* 224*    Recent Results (from the past 240 hour(s))  Culture, blood (routine x 2)     Status: None (Preliminary result)   Collection Time: 07/23/14  3:20 PM  Result Value Ref Range Status   Specimen Description BLOOD LEFT ARM  Final   Special Requests BOTTLES DRAWN AEROBIC ONLY 3CC  Final   Culture  Setup Time   Final    07/23/2014 21:17 Performed at [facility]    Culture   Final           BLOOD CULTURE RECEIVED NO GROWTH TO DATE CULTURE WILL BE HELD FOR  5 DAYS BEFORE ISSUING A FINAL NEGATIVE REPORT Performed at [facility]    Report Status PENDING  Incomplete  Culture, blood (routine x 2)     Status: None (Preliminary result)   Collection Time: 07/23/14  3:30 PM  Result Value Ref Range Status   Specimen Description BLOOD HAND RIGHT  Final   Special Requests BOTTLES DRAWN AEROBIC AND ANAEROBIC 5CC  Final   Culture  Setup Time   Final    07/23/2014 21:15 Performed at [facility]    Culture   Final           BLOOD CULTURE RECEIVED NO GROWTH TO DATE CULTURE WILL BE HELD FOR 5 DAYS BEFORE ISSUING A FINAL NEGATIVE REPORT Performed at [facility]  Report Status PENDING  Incomplete     Studies:  Recent x-ray studies have been reviewed in detail by the Attending Physician  Scheduled Meds:  Scheduled Meds: . carvedilol  3.125 mg Oral BID WC  . darifenacin  15 mg Oral Daily  . heparin  5,000 Units Subcutaneous 3 times per day  . LORazepam  1 mg Oral TID  . losartan  100 mg Oral Daily  . pantoprazole  40 mg Oral Q1200  . potassium chloride  40 mEq Oral Q4H  . sodium chloride  3 mL Intravenous Q12H    Time spent on care of this patient: 44 mins   Whitfield Dulay, MD, FACP, FHM. [facility] Pager [phone]  If 7PM-7AM, please contact night-coverage [email or web address] Password [email or web address] 07/25/2014, 3:58 PM   LOS: 2 days

## 2014-07-26 DIAGNOSIS — R410 Disorientation, unspecified: Secondary | ICD-10-CM

## 2014-07-26 DIAGNOSIS — F329 Major depressive disorder, single episode, unspecified: Secondary | ICD-10-CM

## 2014-07-26 DIAGNOSIS — F419 Anxiety disorder, unspecified: Secondary | ICD-10-CM

## 2014-07-26 LAB — COMPREHENSIVE METABOLIC PANEL
ALT: 52 U/L — ABNORMAL HIGH (ref 0–35)
AST: 87 U/L — ABNORMAL HIGH (ref 0–37)
Albumin: 3.2 g/dL — ABNORMAL LOW (ref 3.5–5.2)
Alkaline Phosphatase: 120 U/L — ABNORMAL HIGH (ref 39–117)
Anion gap: 11 (ref 5–15)
BUN: <5 mg/dL — ABNORMAL LOW (ref 6–23)
CO2: 22 mmol/L (ref 19–32)
Calcium: 9.3 mg/dL (ref 8.4–10.5)
Chloride: 105 mEq/L (ref 96–112)
Creatinine, Ser: 0.87 mg/dL (ref 0.50–1.10)
GFR calc Af Amer: 78 mL/min — ABNORMAL LOW (ref >90)
GFR calc non Af Amer: 67 mL/min — ABNORMAL LOW (ref >90)
Glucose, Bld: 139 mg/dL — ABNORMAL HIGH (ref 70–99)
Potassium: 4.4 mmol/L (ref 3.5–5.1)
Sodium: 138 mmol/L (ref 135–145)
Total Bilirubin: 1.6 mg/dL — ABNORMAL HIGH (ref 0.3–1.2)
Total Protein: 7.1 g/dL (ref 6.0–8.3)

## 2014-07-26 LAB — GLUCOSE, CAPILLARY
Glucose-Capillary: 127 mg/dL — ABNORMAL HIGH (ref 70–99)
Glucose-Capillary: 135 mg/dL — ABNORMAL HIGH (ref 70–99)
Glucose-Capillary: 129 mg/dL — ABNORMAL HIGH (ref 70–99)
Glucose-Capillary: 132 mg/dL — ABNORMAL HIGH (ref 70–99)

## 2014-07-26 LAB — CBC
HCT: 43.7 % (ref 36.0–46.0)
Hemoglobin: 14.5 g/dL (ref 12.0–15.0)
MCH: 30.8 pg (ref 26.0–34.0)
MCHC: 33.2 g/dL (ref 30.0–36.0)
MCV: 92.8 fL (ref 78.0–100.0)
Platelets: 211 10*3/uL (ref 150–400)
RBC: 4.71 MIL/uL (ref 3.87–5.11)
RDW: 14.9 % (ref 11.5–15.5)
WBC: 6.2 10*3/uL (ref 4.0–10.5)

## 2014-07-26 NOTE — Progress Notes (Signed)
Progress Note  [name] SHF:[id number] DOB: August 01, 1946 DOA: 07/23/2014 PCP: [name], MD  Admit HPI / Brief Narrative: 68 yo female with a VP shunt, depression, chronic pain on opiates, and DM who presented to the ED with several days of worsening mental status. Family reported that pt had been complaining of worse knee pains and subsequently has been taking a larger amount of Vicodin over 3-4 days. In the ED, pt's mentation slightly improved with narcan. Following admission, it was discovered that a total of 17 1mg  ativan tablets were missing from a Rx bottle filled ~24hrs prior to admission.    HPI/Subjective: No further vomiting since yesterday. Heartburn resolved. As per nursing, no hallucinations.  Assessment/Plan:  Altered mental status - accidental drug overdose (narcotic and benzo) CT head unrevealing - NS does not feel shunt is malfunctioning/infected - hx suggestive of overuse of narcotic and pill counts suggestive of overuse of ativan - resume low dose narcotic and benzo to avoid withdrawal, but pt will need significantly lower doses at d/c and will need to be cautioned again against using her medications more frequently than prescribed - As discussed with friend [name] on 12/24, mental status has significantly improved. Agree with minimizing opioids. Patient usually takes lorazepam 1 mg 4 times a day dutifully daily for years. Would reduce dose gradually and will start with reducing by 25% of daily dose to 1 mg 3 times a day to avoid withdrawal seizures. Also counseled patient and her friend at bedside extensively that patient's medications need to be kept away from her and given to her by someone else because patient seems to forget that she has taken these medications and hence takes them again.  Mild transaminates  Likely mild shock liver due to sedation, v/s effect of tylenol in pain med - tylenol level not checked at presentation, but LFTs are currently improving -  OP FU. Patient seems to have chronic mildly abnormal LFTs dating back to July 2015. Repeated acute hepatitis panel testing 3 since July have been negative.  DM CBGs mildly uncontrolled and fluctuating. We'll place on NovoLog SSI.  Equivocal UA Urine culture negative.  Hx hydrocephalus w/ VP shunt  As per neurosurgery, no acute events or intervention needed and have signed off 12/24  HTN Mildly uncontrolled. Continue carvedilol and ARB. Monitor closely  Chronic pain See discussion above   Depression w/ anxiety disorder - Patient on polypharmacy at home which also could contribute to altered mental status. Requested psychiatry consultation 12/24, to optimize medications. Patient sees Dr. Chucky May, Psychiatry as OP.  MVP  GERD Start PPI and when necessary Maalox. Improved.  Hypokalemia Replaced  Anemia and leukopenia - CBC normal. ? Previous abnormal values ? Lab error. FU CBC in am again.  Code Status: FULL Family Communication: None at bedside.  Disposition Plan: discharge home pending psychiatry in put ? 12/26  Consultants: Neurosurgery Psychiatry- pending.  Procedures: none  Antibiotics: none  DVT prophylaxis: SCDs  Objective:  Filed Vitals:   07/25/14 2108 07/25/14 2233 07/26/14 0551 07/26/14 0659  BP: 177/79 149/90 175/82 165/86  Pulse:  104 81   Temp:   98.2 F (36.8 C)   TempSrc:   Oral   Resp:   20   Height:      Weight:      SpO2:   93%      Intake/Output Summary (Last 24 hours) at 07/26/14 1103 Last data filed at 07/26/14 0921  Gross per 24 hour  Intake  1280 ml  Output   1200 ml  Net     80 ml   Exam: General: No acute respiratory distress. Moderately built and obese female lying comfortably supine in bed. Lungs: Clear to auscultation bilaterally without wheezes or crackles. No increased work of breathing. Cardiovascular: Regular rate and rhythm without murmur gallop or rub normal S1 and S2 Abdomen: Nontender, nondistended,  soft, bowel sounds positive, no rebound, no ascites, no appreciable mass Extremities: No significant cyanosis, clubbing, or edema bilateral lower extremities CNS: Alert and oriented. No focal neurological deficits. No tremors noticed at this time or hallucinations. Psychiatric: Pleasant and appropriate.  Data Reviewed: Basic Metabolic Panel:  Recent Labs Lab 07/23/14 1520 07/23/14 1937 07/24/14 0248 07/25/14 0620 07/26/14 0545  NA 140  --  137 141 138  K 4.2  --  3.8 3.0* 4.4  CL 104  --  107 114* 105  CO2 24  --  25 21 22   GLUCOSE 169*  --  168* 113* 139*  BUN 16  --  12 6 <5*  CREATININE 1.04 0.86 0.71 0.61 0.87  CALCIUM 9.0  --  7.7* 7.2* 9.3  MG  --   --   --  1.5  --     Liver Function Tests:  Recent Labs Lab 07/23/14 1520 07/24/14 0248 07/25/14 0620 07/26/14 0545  AST 156* 121* 89* 87*  ALT 69* 55* 47* 52*  ALKPHOS 109 92 86 120*  BILITOT 1.2 1.3* 1.1 1.6*  PROT 6.5 5.1* 5.0* 7.1  ALBUMIN 3.1* 2.4* 2.2* 3.2*    Recent Labs Lab 07/23/14 1520 07/25/14 0615  AMMONIA 37* 36*   Coags:  Recent Labs Lab 07/23/14 1520 07/25/14 0620  INR 1.26 1.33   CBC:  Recent Labs Lab 07/23/14 1520 07/23/14 1937 07/24/14 0248 07/25/14 0620 07/26/14 0545  WBC 5.6 5.1 3.1* 3.7* 6.2  NEUTROABS 4.0  --   --   --   --   HGB 12.3 13.6 10.7* 11.1* 14.5  HCT 38.6 40.7 33.4* 34.7* 43.7  MCV 92.6 93.8 95.2 91.8 92.8  PLT 232 179 161 167 211   CBG:  Recent Labs Lab 07/24/14 1212 07/24/14 1743 07/25/14 1655 07/25/14 2155 07/26/14 0810  GLUCAP 169* 224* 161* 145* 127*    Recent Results (from the past 240 hour(s))  Culture, blood (routine x 2)     Status: None (Preliminary result)   Collection Time: 07/23/14  3:20 PM  Result Value Ref Range Status   Specimen Description BLOOD LEFT ARM  Final   Special Requests BOTTLES DRAWN AEROBIC ONLY 3CC  Final   Culture  Setup Time   Final    07/23/2014 21:17 Performed at [facility]    Culture   Final            BLOOD CULTURE RECEIVED NO GROWTH TO DATE CULTURE WILL BE HELD FOR 5 DAYS BEFORE ISSUING A FINAL NEGATIVE REPORT Performed at [facility]    Report Status PENDING  Incomplete  Culture, blood (routine x 2)     Status: None (Preliminary result)   Collection Time: 07/23/14  3:30 PM  Result Value Ref Range Status   Specimen Description BLOOD HAND RIGHT  Final   Special Requests BOTTLES DRAWN AEROBIC AND ANAEROBIC 5CC  Final   Culture  Setup Time   Final    07/23/2014 21:15 Performed at [facility]    Culture   Final           BLOOD  CULTURE RECEIVED NO GROWTH TO DATE CULTURE WILL BE HELD FOR 5 DAYS BEFORE ISSUING A FINAL NEGATIVE REPORT Performed at [facility]    Report Status PENDING  Incomplete  Culture, Urine     Status: None   Collection Time: 07/24/14  3:51 PM  Result Value Ref Range Status   Specimen Description URINE, RANDOM  Final   Special Requests NONE  Final   Culture  Setup Time   Final    07/24/2014 21:00 Performed at [facility] Performed at [facility]   Final   Culture NO GROWTH Performed at [facility]   Final   Report Status 07/25/2014 FINAL  Final     Studies:  Recent x-ray studies have been reviewed in detail by the Attending Physician  Scheduled Meds:  Scheduled Meds: . aspirin EC  325 mg Oral Daily  . carvedilol  3.125 mg Oral BID WC  . darifenacin  15 mg Oral Daily  . heparin  5,000 Units Subcutaneous 3 times per day  . insulin aspart  0-9 Units Subcutaneous TID WC  . Linaclotide  145 mcg Oral Daily  . LORazepam  1 mg Oral TID  . losartan  100 mg Oral Daily  . pantoprazole  40 mg Oral Q1200  . sodium chloride  3 mL Intravenous Q12H    Time spent on care of this patient: 65 mins   Chisa Kushner, MD, FACP, FHM. [facility] Pager [phone]  If 7PM-7AM, please contact night-coverage [email or web address] Password [email or web address] 07/26/2014, 11:03 AM   LOS: 3 days

## 2014-07-26 NOTE — Consult Note (Signed)
[facility] Consult   Reason for Consult:  Altered Mental Status/ History of Depression Referring Physician:  Dr. Dwana Curd is an 68 y.o. female. Total Time spent with patient: 30 minutes  Assessment: AXIS I:  Delirium-  Currently improving.  Anxiety Disorder NOS, Depression ( Consider Adjustment Disorder with Depressed Mood)  AXIS II:  Deferred AXIS III:   Past Medical History  Diagnosis Date  . Depression   . Anxiety   . Hypertension   . MVP (mitral valve prolapse)   . Hydrocephalus   . Diabetes mellitus without complication   . Sinus bradycardia   . Dizziness   . Falls   . Acute kidney injury   . Transaminitis   . Hypotension   . Acid reflux disease    AXIS IV:  Lives alone, identifies loneliness as a stressor AXIS V:  41-50 serious symptoms  Plan:  No evidence of imminent risk to self or others at present.   Patient does not meet criteria for psychiatric inpatient admission.  Subjective:   [name] is a 68 y.o. female patient admitted with  Altered mental status .  HPI:    Patient is a 68 year old. She was admitted on 12/22 due to altered mental status/confusion. Reportedly she had been stumbling and having difficulties with gait and balance prior to admission. Patient states she does not remember exactly how " I ended up coming to the hospital" but does state she has been feeling confused and forgetful.  She also reports some  Recent visual hallucinations ( with preserved reality testing) at home. " I thought I saw a cat and a hedgehog in the house".  She denies any current hallucinations, and does not appear internally preoccupied .  She has a history of VP shunt for hydrocephalus , but CT scan was unrevealing and it was not felt that this was playing a direct role in her worsening mental status  Patient had been on opiate analgesics ( Vicodin) for  Knee pain. She is also on Ativan, which was prescribed at 1 mgr 4 x daily. She  states she may have been taking more than prescribed, as she tends to forget if she has taken her medication or not. As per Ativan pill count, there were 17 missing.  As per chart and Nursing Staff, she has been improving gradually. Patient acknowledges she is feeling better, and is hoping for discharge soon. She acknowledges she suffers from depression but states that at this time she is not feeling severely depressed, and is denying any severe anhedonia or sadness. She denies any suicidal ideations. She is looking forward to discharge soon and to spending time with friends during the holiday season. At this time she is fully alert and attentive. She is oriented to [facility], [place], [place], and to Christmas day, 2015. She was able to spell W-O-R-[id number]-D, D-L-[id number]-O-W, without difficulties. Recall was 3/3 immediate and 2/3 at 5 minutes . HPI Elements:   Mental Status Changes , delirium, possibly medication related. No acute stressors identified by patient.  Past Psychiatric History:- patient states she has a history of depression and some anxiety. She follows up with Dr. Chucky May for outpatient psychiatric management. She denies any history of suicidal attempts, she denies history of mania. Endorses history of  Superficial self cutting as a coping mechanism to deal with negative affects such as feeling lonely, but states she has not engaged in this for " a long time". Past  Medical History  Diagnosis Date  . Depression   . Anxiety   . Hypertension   . MVP (mitral valve prolapse)   . Hydrocephalus   . Diabetes mellitus without complication   . Sinus bradycardia   . Dizziness   . Falls   . Acute kidney injury   . Transaminitis   . Hypotension   . Acid reflux disease     reports that she has never smoked. She has never used smokeless tobacco. She reports that she does not drink alcohol or use illicit drugs. Family History  Problem Relation Age of Onset  . Heart disease  Mother   . Stroke Mother   . Alcohol abuse Father   . Diabetes Father   . Cancer Neg Hx   . Hypertension Mother      Living Arrangements: Non-relatives/Friends   Abuse/Neglect [facility]) Physical Abuse: Denies Verbal Abuse: Denies Sexual Abuse: Denies Allergies:   Allergies  Allergen Reactions  . Metformin And Related Diarrhea    (takes metformin at home w/ meals)  . Ace Inhibitors Other (See Comments)    unknown  . Erythromycin Hives  . Penicillins Hives     Objective: Blood pressure 153/74, pulse 79, temperature 97.8 F (36.6 C), temperature source Oral, resp. rate 18, height $RemoveBe'5\' 5"'[id number]$  (1.651 m), weight 104.3 kg (229 lb 15 oz), SpO2 92 %.Body mass index is 38.26 kg/(m^2). Results for orders placed or performed during the hospital encounter of 07/23/14 (from the past 72 hour(s))  Urinalysis, Routine w reflex microscopic     Status: Abnormal   Collection Time: 07/23/14  3:53 PM  Result Value Ref Range   Color, Urine AMBER (A) YELLOW    Comment: BIOCHEMICALS MAY BE AFFECTED BY COLOR   APPearance CLEAR CLEAR   Specific Gravity, Urine 1.023 1.005 - 1.030   pH 6.0 5.0 - 8.0   Glucose, UA NEGATIVE NEGATIVE mg/dL   Hgb urine dipstick NEGATIVE NEGATIVE   Bilirubin Urine SMALL (A) NEGATIVE   Ketones, ur NEGATIVE NEGATIVE mg/dL   Protein, ur NEGATIVE NEGATIVE mg/dL   Urobilinogen, UA 1.0 0.0 - 1.0 mg/dL   Nitrite NEGATIVE NEGATIVE   Leukocytes, UA TRACE (A) NEGATIVE  Urine microscopic-add on     Status: Abnormal   Collection Time: 07/23/14  3:53 PM  Result Value Ref Range   Squamous Epithelial / LPF FEW (A) RARE   WBC, UA 3-6 <3 WBC/hpf   RBC / HPF 0-2 <3 RBC/hpf   Bacteria, UA RARE RARE   Casts GRANULAR CAST (A) NEGATIVE  Urine rapid drug screen (hosp performed)     Status: Abnormal   Collection Time: 07/23/14  3:53 PM  Result Value Ref Range   Opiates POSITIVE (A) NONE DETECTED   Cocaine NONE DETECTED NONE DETECTED   Benzodiazepines POSITIVE (A) NONE DETECTED    Amphetamines NONE DETECTED NONE DETECTED   Tetrahydrocannabinol NONE DETECTED NONE DETECTED   Barbiturates NONE DETECTED NONE DETECTED    Comment:        DRUG SCREEN FOR MEDICAL PURPOSES ONLY.  IF CONFIRMATION IS NEEDED FOR ANY PURPOSE, NOTIFY LAB WITHIN 5 DAYS.        LOWEST DETECTABLE LIMITS FOR URINE DRUG SCREEN Drug Class       Cutoff (ng/mL) Amphetamine      1000 Barbiturate      200 Benzodiazepine   707 Tricyclics       867 Opiates          300 Cocaine  300 THC              50   I-Stat CG4 Lactic Acid, ED     Status: Abnormal   Collection Time: 07/23/14  4:04 PM  Result Value Ref Range   Lactic Acid, Venous 3.10 (H) 0.5 - 2.2 mmol/L  Ethanol     Status: None   Collection Time: 07/23/14  5:34 PM  Result Value Ref Range   Alcohol, Ethyl (B) <5 0 - 9 mg/dL    Comment:        LOWEST DETECTABLE LIMIT FOR SERUM ALCOHOL IS 11 mg/dL FOR MEDICAL PURPOSES ONLY   I-Stat Arterial Blood Gas, ED - (order at [facility] and [facility] only)     Status: Abnormal   Collection Time: 07/23/14  6:31 PM  Result Value Ref Range   pH, Arterial 7.340 (L) 7.350 - 7.450   pCO2 arterial 46.4 (H) 35.0 - 45.0 mmHg   pO2, Arterial 78.0 (L) 80.0 - 100.0 mmHg   Bicarbonate 24.9 (H) 20.0 - 24.0 mEq/L   TCO2 26 0 - 100 mmol/L   O2 Saturation 94.0 %   Acid-base deficit 1.0 0.0 - 2.0 mmol/L   Patient temperature 99.4 F    Collection site RADIAL, ALLEN'S TEST ACCEPTABLE    Drawn by RT    Sample type ARTERIAL   CBC     Status: None   Collection Time: 07/23/14  7:37 PM  Result Value Ref Range   WBC 5.1 4.0 - 10.5 K/uL    Comment: WHITE COUNT CONFIRMED ON SMEAR   RBC 4.34 3.87 - 5.11 MIL/uL   Hemoglobin 13.6 12.0 - 15.0 g/dL   HCT 40.7 36.0 - 46.0 %   MCV 93.8 78.0 - 100.0 fL   MCH 31.3 26.0 - 34.0 pg   MCHC 33.4 30.0 - 36.0 g/dL   RDW 14.9 11.5 - 15.5 %   Platelets 179 150 - 400 K/uL  Creatinine, serum     Status: Abnormal   Collection Time: 07/23/14  7:37 PM  Result Value Ref Range    Creatinine, Ser 0.86 0.50 - 1.10 mg/dL   GFR calc non Af Amer 68 (L) >90 mL/min   GFR calc Af Amer 79 (L) >90 mL/min    Comment: (NOTE) The eGFR has been calculated using the CKD EPI equation. This calculation has not been validated in all clinical situations. eGFR's persistently <90 mL/min signify possible Chronic Kidney Disease.   Glucose, capillary     Status: Abnormal   Collection Time: 07/23/14  8:17 PM  Result Value Ref Range   Glucose-Capillary 143 (H) 70 - 99 mg/dL  Glucose, capillary     Status: Abnormal   Collection Time: 07/23/14 11:49 PM  Result Value Ref Range   Glucose-Capillary 142 (H) 70 - 99 mg/dL  Comprehensive metabolic panel     Status: Abnormal   Collection Time: 07/24/14  2:48 AM  Result Value Ref Range   Sodium 137 135 - 145 mmol/L    Comment: Please note change in reference range.   Potassium 3.8 3.5 - 5.1 mmol/L    Comment: Please note change in reference range.   Chloride 107 96 - 112 mEq/L   CO2 25 19 - 32 mmol/L   Glucose, Bld 168 (H) 70 - 99 mg/dL   BUN 12 6 - 23 mg/dL   Creatinine, Ser 0.71 0.50 - 1.10 mg/dL   Calcium 7.7 (L) 8.4 - 10.5 mg/dL   Total Protein 5.1 (L) 6.0 -  8.3 g/dL   Albumin 2.4 (L) 3.5 - 5.2 g/dL   AST 121 (H) 0 - 37 U/L   ALT 55 (H) 0 - 35 U/L   Alkaline Phosphatase 92 39 - 117 U/L   Total Bilirubin 1.3 (H) 0.3 - 1.2 mg/dL   GFR calc non Af Amer 87 (L) >90 mL/min   GFR calc Af Amer >90 >90 mL/min    Comment: (NOTE) The eGFR has been calculated using the CKD EPI equation. This calculation has not been validated in all clinical situations. eGFR's persistently <90 mL/min signify possible Chronic Kidney Disease.    Anion gap 5 5 - 15  CBC     Status: Abnormal   Collection Time: 07/24/14  2:48 AM  Result Value Ref Range   WBC 3.1 (L) 4.0 - 10.5 K/uL   RBC 3.51 (L) 3.87 - 5.11 MIL/uL   Hemoglobin 10.7 (L) 12.0 - 15.0 g/dL    Comment: REPEATED TO VERIFY   HCT 33.4 (L) 36.0 - 46.0 %   MCV 95.2 78.0 - 100.0 fL   MCH 30.5  26.0 - 34.0 pg   MCHC 32.0 30.0 - 36.0 g/dL   RDW 14.9 11.5 - 15.5 %   Platelets 161 150 - 400 K/uL  Glucose, capillary     Status: Abnormal   Collection Time: 07/24/14  4:35 AM  Result Value Ref Range   Glucose-Capillary 166 (H) 70 - 99 mg/dL  Glucose, capillary     Status: Abnormal   Collection Time: 07/24/14  8:32 AM  Result Value Ref Range   Glucose-Capillary 145 (H) 70 - 99 mg/dL   Comment 1 Documented in Chart    Comment 2 Notify RN   Glucose, capillary     Status: Abnormal   Collection Time: 07/24/14 12:12 PM  Result Value Ref Range   Glucose-Capillary 169 (H) 70 - 99 mg/dL   Comment 1 Documented in Chart    Comment 2 Notify RN   Acetaminophen level     Status: Abnormal   Collection Time: 07/24/14 12:29 PM  Result Value Ref Range   Acetaminophen (Tylenol), Serum <10.0 (L) 10 - 30 ug/mL    Comment:        THERAPEUTIC CONCENTRATIONS VARY SIGNIFICANTLY. A RANGE OF 10-30 ug/mL MAY BE AN EFFECTIVE CONCENTRATION FOR MANY PATIENTS. HOWEVER, SOME ARE BEST TREATED AT CONCENTRATIONS OUTSIDE THIS RANGE. ACETAMINOPHEN CONCENTRATIONS >150 ug/mL AT 4 HOURS AFTER INGESTION AND >50 ug/mL AT 12 HOURS AFTER INGESTION ARE OFTEN ASSOCIATED WITH TOXIC REACTIONS.   Culture, Urine     Status: None   Collection Time: 07/24/14  3:51 PM  Result Value Ref Range   Specimen Description URINE, RANDOM    Special Requests NONE    Culture  Setup Time      07/24/2014 21:00 Performed at [place] Performed at [facility]     Culture NO GROWTH Performed at [facility]     Report Status 07/25/2014 FINAL   Glucose, capillary     Status: Abnormal   Collection Time: 07/24/14  5:43 PM  Result Value Ref Range   Glucose-Capillary 224 (H) 70 - 99 mg/dL  Ammonia     Status: Abnormal   Collection Time: 07/25/14  6:15 AM  Result Value Ref Range   Ammonia 36 (H) 11 - 32 umol/L    Comment: Please note change in reference range.  CBC      Status: Abnormal  Collection Time: 07/25/14  6:20 AM  Result Value Ref Range   WBC 3.7 (L) 4.0 - 10.5 K/uL   RBC 3.78 (L) 3.87 - 5.11 MIL/uL   Hemoglobin 11.1 (L) 12.0 - 15.0 g/dL   HCT 34.7 (L) 36.0 - 46.0 %   MCV 91.8 78.0 - 100.0 fL   MCH 29.4 26.0 - 34.0 pg   MCHC 32.0 30.0 - 36.0 g/dL   RDW 15.0 11.5 - 15.5 %   Platelets 167 150 - 400 K/uL  Comprehensive metabolic panel     Status: Abnormal   Collection Time: 07/25/14  6:20 AM  Result Value Ref Range   Sodium 141 135 - 145 mmol/L    Comment: Please note change in reference range.   Potassium 3.0 (L) 3.5 - 5.1 mmol/L    Comment: Please note change in reference range.   Chloride 114 (H) 96 - 112 mEq/L   CO2 21 19 - 32 mmol/L   Glucose, Bld 113 (H) 70 - 99 mg/dL   BUN 6 6 - 23 mg/dL   Creatinine, Ser 0.61 0.50 - 1.10 mg/dL   Calcium 7.2 (L) 8.4 - 10.5 mg/dL   Total Protein 5.0 (L) 6.0 - 8.3 g/dL   Albumin 2.2 (L) 3.5 - 5.2 g/dL   AST 89 (H) 0 - 37 U/L   ALT 47 (H) 0 - 35 U/L   Alkaline Phosphatase 86 39 - 117 U/L   Total Bilirubin 1.1 0.3 - 1.2 mg/dL   GFR calc non Af Amer >90 >90 mL/min   GFR calc Af Amer >90 >90 mL/min    Comment: (NOTE) The eGFR has been calculated using the CKD EPI equation. This calculation has not been validated in all clinical situations. eGFR's persistently <90 mL/min signify possible Chronic Kidney Disease.    Anion gap 6 5 - 15  Protime-INR     Status: Abnormal   Collection Time: 07/25/14  6:20 AM  Result Value Ref Range   Prothrombin Time 16.6 (H) 11.6 - 15.2 seconds   INR 1.33 0.00 - 1.49  APTT     Status: Abnormal   Collection Time: 07/25/14  6:20 AM  Result Value Ref Range   aPTT 52 (H) 24 - 37 seconds    Comment:        IF BASELINE aPTT IS ELEVATED, SUGGEST PATIENT RISK ASSESSMENT BE USED TO DETERMINE APPROPRIATE ANTICOAGULANT THERAPY.   Vitamin B12     Status: None   Collection Time: 07/25/14  6:20 AM  Result Value Ref Range   Vitamin B-12 520 211 - 911 pg/mL    Comment:  Performed at [facility]  Folate     Status: None   Collection Time: 07/25/14  6:20 AM  Result Value Ref Range   Folate 12.9 ng/mL    Comment: (NOTE) Reference Ranges        Deficient:       0.4 - 3.3 ng/mL        Indeterminate:   3.4 - 5.4 ng/mL        Normal:              > 5.4 ng/mL Performed at [facility]   Magnesium     Status: None   Collection Time: 07/25/14  6:20 AM  Result Value Ref Range   Magnesium 1.5 1.5 - 2.5 mg/dL  Glucose, capillary     Status: Abnormal   Collection Time: 07/25/14  4:55 PM  Result Value Ref Range  Glucose-Capillary 161 (H) 70 - 99 mg/dL  Glucose, capillary     Status: Abnormal   Collection Time: 07/25/14  9:55 PM  Result Value Ref Range   Glucose-Capillary 145 (H) 70 - 99 mg/dL   Comment 1 Notify RN   CBC     Status: None   Collection Time: 07/26/14  5:45 AM  Result Value Ref Range   WBC 6.2 4.0 - 10.5 K/uL   RBC 4.71 3.87 - 5.11 MIL/uL   Hemoglobin 14.5 12.0 - 15.0 g/dL    Comment: REPEATED TO VERIFY   HCT 43.7 36.0 - 46.0 %   MCV 92.8 78.0 - 100.0 fL   MCH 30.8 26.0 - 34.0 pg   MCHC 33.2 30.0 - 36.0 g/dL   RDW 14.9 11.5 - 15.5 %   Platelets 211 150 - 400 K/uL  Comprehensive metabolic panel     Status: Abnormal   Collection Time: 07/26/14  5:45 AM  Result Value Ref Range   Sodium 138 135 - 145 mmol/L    Comment: Please note change in reference range.   Potassium 4.4 3.5 - 5.1 mmol/L    Comment: Please note change in reference range. DELTA CHECK NOTED    Chloride 105 96 - 112 mEq/L   CO2 22 19 - 32 mmol/L   Glucose, Bld 139 (H) 70 - 99 mg/dL   BUN <5 (L) 6 - 23 mg/dL   Creatinine, Ser 0.87 0.50 - 1.10 mg/dL   Calcium 9.3 8.4 - 10.5 mg/dL   Total Protein 7.1 6.0 - 8.3 g/dL   Albumin 3.2 (L) 3.5 - 5.2 g/dL   AST 87 (H) 0 - 37 U/L   ALT 52 (H) 0 - 35 U/L   Alkaline Phosphatase 120 (H) 39 - 117 U/L   Total Bilirubin 1.6 (H) 0.3 - 1.2 mg/dL   GFR calc non Af Amer 67 (L) >90 mL/min   GFR calc Af Amer 78 (L) >90  mL/min    Comment: (NOTE) The eGFR has been calculated using the CKD EPI equation. This calculation has not been validated in all clinical situations. eGFR's persistently <90 mL/min signify possible Chronic Kidney Disease.    Anion gap 11 5 - 15  Glucose, capillary     Status: Abnormal   Collection Time: 07/26/14  8:10 AM  Result Value Ref Range   Glucose-Capillary 127 (H) 70 - 99 mg/dL  Glucose, capillary     Status: Abnormal   Collection Time: 07/26/14 12:16 PM  Result Value Ref Range   Glucose-Capillary 135 (H) 70 - 99 mg/dL   Labs are reviewed and are pertinent for  improving hypokalemia, elevated transaminases but improved compared to prior, slightly increased Total Bili., improved white blood count . UDS positive for BZD and Opiates   Current Facility-Administered Medications  Medication Dose Route Frequency Provider Last Rate Last Dose  . alum & mag hydroxide-simeth (MAALOX/MYLANTA) 200-200-20 MG/5ML suspension 30 mL  30 mL Oral Q6H PRN Modena Jansky, MD   30 mL at 07/25/14 1025  . aspirin EC tablet 325 mg  325 mg Oral Daily Modena Jansky, MD   325 mg at 07/26/14 0865  . carvedilol (COREG) tablet 3.125 mg  3.125 mg Oral BID WC Donne Hazel, MD   3.125 mg at 07/26/14 7846  . darifenacin (ENABLEX) 24 hr tablet 15 mg  15 mg Oral Daily Cherene Altes, MD   15 mg at 07/26/14 9629  . diclofenac sodium (VOLTAREN) 1 % transdermal  gel 4 g  4 g Topical QID PRN Cherene Altes, MD   4 g at 07/24/14 1352  . heparin injection 5,000 Units  5,000 Units Subcutaneous 3 times per day Donne Hazel, MD   5,000 Units at 07/26/14 1347  . hydrALAZINE (APRESOLINE) injection 10 mg  10 mg Intravenous Q6H PRN Modena Jansky, MD      . insulin aspart (novoLOG) injection 0-9 Units  0-9 Units Subcutaneous TID WC Modena Jansky, MD   2 Units at 07/26/14 1300  . ketorolac (TORADOL) 15 MG/ML injection 15 mg  15 mg Intravenous Q6H PRN Donne Hazel, MD   15 mg at 07/24/14 2039  . Linaclotide  (LINZESS) capsule 145 mcg  145 mcg Oral Daily Modena Jansky, MD   145 mcg at 07/26/14 4098  . LORazepam (ATIVAN) tablet 1 mg  1 mg Oral TID Modena Jansky, MD   1 mg at 07/26/14 1191  . losartan (COZAAR) tablet 100 mg  100 mg Oral Daily Donne Hazel, MD   100 mg at 07/26/14 4782  . ondansetron (ZOFRAN) tablet 4 mg  4 mg Oral Q6H PRN Donne Hazel, MD   4 mg at 07/25/14 0127   Or  . ondansetron [facility]) injection 4 mg  4 mg Intravenous Q6H PRN Donne Hazel, MD   4 mg at 07/26/14 9562  . oxyCODONE (Oxy IR/ROXICODONE) immediate release tablet 5 mg  5 mg Oral Q6H PRN Cherene Altes, MD   5 mg at 07/25/14 0127  . pantoprazole (PROTONIX) EC tablet 40 mg  40 mg Oral Q1200 Modena Jansky, MD   40 mg at 07/26/14 1308  . sodium chloride 0.9 % injection 3 mL  3 mL Intravenous Q12H Donne Hazel, MD   3 mL at 07/26/14 1000    Psychiatric Specialty Exam:     Blood pressure 153/74, pulse 79, temperature 97.8 F (36.6 C), temperature source Oral, resp. rate 18, height $RemoveBe'5\' 5"'ooMyXTuVp$  (1.651 m), weight 104.3 kg (229 lb 15 oz), SpO2 92 %.Body mass index is 38.26 kg/(m^2).  General Appearance: Fairly Groomed-in hospital garb  Eye Contact::  Good  Speech:  Normal Rate- of note, patient states she has hypoacusia  Volume:  Normal  Mood:  improved, and currently denies severe depression  Affect:  appropriate, reactive, smiles at times appropriately  Thought Process:  Goal Directed and Linear  Orientation:  Other:  as above, oriented to place, person, and time ( except for day of week)   Thought Content:  denies any current hallucinations, no delusions expressed , does not appear internally preoccupied   Suicidal Thoughts:  No- denies any thoughts of hurting self or anyone else, denies any thoughts of self cutting  Homicidal Thoughts:  No  Memory:  immediate 3/3, recent 2/3 recall.  Judgement:  Other:  improved   Insight:  Present  Psychomotor Activity:  Decreased  Concentration:  Good  Recall:  [place] of Knowledge:Good  Language: Good  Akathisia:  Negative  Handed:  Right  AIMS (if indicated):     Assets:  Desire for Improvement Resilience  Sleep:      Musculoskeletal: Strength & Muscle Tone: within normal limits Gait & Station: gait not examined Patient leans: N/A  Treatment Plan Summary: 1. At this time there are no criteria/grounds for psychiatric admission or involuntary commitment. 2. Based on recent history, would minimize use of opiates and benzodiazepines.  3.I Agree with gradual BZD  taper.  Currently on Ativan 1 mgr TID . Would consider decreasing further to  1 mgr BID in 3-4 days. Hold BZD if sedated. 4.. Patient may benefit from antidepressant management- would consider an SSRI trial, such as Zoloft, starting at 25 mgrs QDAY initially 4. Patient states she plans to follow up with Dr. Chucky May after discharge for ongoing psychiatric management.   Neita Garnet 07/26/2014 3:46 PM

## 2014-07-27 LAB — GLUCOSE, CAPILLARY
Glucose-Capillary: 142 mg/dL — ABNORMAL HIGH (ref 70–99)
Glucose-Capillary: 137 mg/dL — ABNORMAL HIGH (ref 70–99)
Glucose-Capillary: 147 mg/dL — ABNORMAL HIGH (ref 70–99)
Glucose-Capillary: 148 mg/dL — ABNORMAL HIGH (ref 70–99)

## 2014-07-27 LAB — CBC
HCT: 43.3 % (ref 36.0–46.0)
Hemoglobin: 14.3 g/dL (ref 12.0–15.0)
MCH: 30.8 pg (ref 26.0–34.0)
MCHC: 33 g/dL (ref 30.0–36.0)
MCV: 93.1 fL (ref 78.0–100.0)
Platelets: 205 10*3/uL (ref 150–400)
RBC: 4.65 MIL/uL (ref 3.87–5.11)
RDW: 14.9 % (ref 11.5–15.5)
WBC: 5.3 10*3/uL (ref 4.0–10.5)

## 2014-07-27 MED ORDER — SERTRALINE HCL 25 MG PO TABS
25.0000 mg | ORAL_TABLET | Freq: Every day | ORAL | Status: DC
Start: 2014-07-27 — End: 2014-07-28
  Administered 2014-07-27 – 2014-07-28 (×2): 25 mg via ORAL
  Filled 2014-07-27 (×2): qty 1

## 2014-07-27 NOTE — Progress Notes (Signed)
Progress Note  [name] QIW:[id number] DOB: September 24, 1945 DOA: 07/23/2014 PCP: [name], MD  Admit HPI / Brief Narrative: 68 yo female with a VP shunt, depression, chronic pain on opiates, and DM who presented to the ED with several days of worsening mental status. Family reported that pt had been complaining of worse knee pains and subsequently has been taking a larger amount of Vicodin over 3-4 days. In the ED, pt's mentation slightly improved with narcan. Following admission, it was discovered that a total of 17 1mg  ativan tablets were missing from a Rx bottle filled ~24hrs prior to admission.    HPI/Subjective: Patient denies complaints. As per nursing, no acute events.  Assessment/Plan:  Altered mental status - accidental drug overdose (narcotic and benzo) CT head unrevealing - NS does not feel shunt is malfunctioning/infected - hx suggestive of overuse of narcotic and pill counts suggestive of overuse of ativan - resume low dose narcotic and benzo to avoid withdrawal, but pt will need significantly lower doses at d/c and will need to be cautioned again against using her medications more frequently than prescribed - As discussed with friend [name] on 12/24, mental status has significantly improved. Agree with minimizing opioids. Patient usually takes lorazepam 1 mg 4 times a day dutifully daily for years. Would reduce dose gradually and will start with reducing by 25% of daily dose to 1 mg 3 times a day to avoid withdrawal seizures. Also counseled patient and her friend at bedside extensively that patient's medications need to be kept away from her and given to her by someone else because patient seems to forget that she has taken these medications and hence takes them again.  Mild transaminates  Likely mild shock liver due to sedation, v/s effect of tylenol in pain med - tylenol level not checked at presentation, but LFTs are currently improving - OP FU. Patient seems to have  chronic mildly abnormal LFTs dating back to July 2015. Repeated acute hepatitis panel testing 3 since July have been negative.  DM CBGs mildly uncontrolled and fluctuating. We'll place on NovoLog SSI.  Equivocal UA Urine culture negative.  Hx hydrocephalus w/ VP shunt  As per neurosurgery, no acute events or intervention needed and have signed off 12/24  HTN Mildly uncontrolled. Continue carvedilol and ARB. Monitor closely  Chronic pain See discussion above   Depression w/ anxiety disorder - Psychiatry consultation 12/25 appreciated. Psychiatry recommending Zoloft 25 MG daily-we'll initiate. Patient sees Dr. Chucky May, Psychiatry as OP.  MVP  GERD Start PPI and when necessary Maalox. Improved.  Hypokalemia Replaced  Anemia and leukopenia - CBC normal.   Code Status: FULL Family Communication: None at bedside.  Disposition Plan: discharge home 12/27 after monitoring new initiation of Zoloft today.  Consultants: Neurosurgery Psychiatry- pending.  Procedures: none  Antibiotics: none  DVT prophylaxis: SCDs  Objective:  Filed Vitals:   07/26/14 2131 07/27/14 0652 07/27/14 0705 07/27/14 0957  BP:  183/65 154/61 151/83  Pulse:  80 93 100  Temp: 98.4 F (36.9 C) 97.9 F (36.6 C)    TempSrc: Oral Oral    Resp: 18 18    Height:      Weight:      SpO2: 93% 94%       Intake/Output Summary (Last 24 hours) at 07/27/14 1353 Last data filed at 07/27/14 0900  Gross per 24 hour  Intake    668 ml  Output   1100 ml  Net   -432 ml  Exam: General: No acute respiratory distress. Moderately built and obese female lying comfortably supine in bed. Lungs: Clear to auscultation bilaterally without wheezes or crackles. No increased work of breathing. Cardiovascular: Regular rate and rhythm without murmur gallop or rub normal S1 and S2 Abdomen: Nontender, nondistended, soft, bowel sounds positive, no rebound, no ascites, no appreciable mass Extremities: No  significant cyanosis, clubbing, or edema bilateral lower extremities CNS: Alert and oriented. No focal neurological deficits. No tremors noticed at this time or hallucinations. Psychiatric: Pleasant and appropriate.  Data Reviewed: Basic Metabolic Panel:  Recent Labs Lab 07/23/14 1520 07/23/14 1937 07/24/14 0248 07/25/14 0620 07/26/14 0545  NA 140  --  137 141 138  K 4.2  --  3.8 3.0* 4.4  CL 104  --  107 114* 105  CO2 24  --  25 21 22   GLUCOSE 169*  --  168* 113* 139*  BUN 16  --  12 6 <5*  CREATININE 1.04 0.86 0.71 0.61 0.87  CALCIUM 9.0  --  7.7* 7.2* 9.3  MG  --   --   --  1.5  --     Liver Function Tests:  Recent Labs Lab 07/23/14 1520 07/24/14 0248 07/25/14 0620 07/26/14 0545  AST 156* 121* 89* 87*  ALT 69* 55* 47* 52*  ALKPHOS 109 92 86 120*  BILITOT 1.2 1.3* 1.1 1.6*  PROT 6.5 5.1* 5.0* 7.1  ALBUMIN 3.1* 2.4* 2.2* 3.2*    Recent Labs Lab 07/23/14 1520 07/25/14 0615  AMMONIA 37* 36*   Coags:  Recent Labs Lab 07/23/14 1520 07/25/14 0620  INR 1.26 1.33   CBC:  Recent Labs Lab 07/23/14 1520 07/23/14 1937 07/24/14 0248 07/25/14 0620 07/26/14 0545 07/27/14 0428  WBC 5.6 5.1 3.1* 3.7* 6.2 5.3  NEUTROABS 4.0  --   --   --   --   --   HGB 12.3 13.6 10.7* 11.1* 14.5 14.3  HCT 38.6 40.7 33.4* 34.7* 43.7 43.3  MCV 92.6 93.8 95.2 91.8 92.8 93.1  PLT 232 179 161 167 211 205   CBG:  Recent Labs Lab 07/26/14 1216 07/26/14 1630 07/26/14 2131 07/27/14 0758 07/27/14 1143  GLUCAP 135* 129* 132* 142* 137*    Recent Results (from the past 240 hour(s))  Culture, blood (routine x 2)     Status: None (Preliminary result)   Collection Time: 07/23/14  3:20 PM  Result Value Ref Range Status   Specimen Description BLOOD LEFT ARM  Final   Special Requests BOTTLES DRAWN AEROBIC ONLY 3CC  Final   Culture  Setup Time   Final    07/23/2014 21:17 Performed at [facility]    Culture   Final           BLOOD CULTURE RECEIVED NO GROWTH TO DATE  CULTURE WILL BE HELD FOR 5 DAYS BEFORE ISSUING A FINAL NEGATIVE REPORT Performed at [facility]    Report Status PENDING  Incomplete  Culture, blood (routine x 2)     Status: None (Preliminary result)   Collection Time: 07/23/14  3:30 PM  Result Value Ref Range Status   Specimen Description BLOOD HAND RIGHT  Final   Special Requests BOTTLES DRAWN AEROBIC AND ANAEROBIC 5CC  Final   Culture  Setup Time   Final    07/23/2014 21:15 Performed at [facility]    Culture   Final           BLOOD CULTURE RECEIVED NO GROWTH TO DATE CULTURE WILL  BE HELD FOR 5 DAYS BEFORE ISSUING A FINAL NEGATIVE REPORT Performed at [facility]    Report Status PENDING  Incomplete  Culture, Urine     Status: None   Collection Time: 07/24/14  3:51 PM  Result Value Ref Range Status   Specimen Description URINE, RANDOM  Final   Special Requests NONE  Final   Culture  Setup Time   Final    07/24/2014 21:00 Performed at [place] Performed at [facility]   Final   Culture NO GROWTH Performed at [facility]   Final   Report Status 07/25/2014 FINAL  Final     Studies:  Recent x-ray studies have been reviewed in detail by the Attending Physician  Scheduled Meds:  Scheduled Meds: . aspirin EC  325 mg Oral Daily  . carvedilol  3.125 mg Oral BID WC  . darifenacin  15 mg Oral Daily  . heparin  5,000 Units Subcutaneous 3 times per day  . insulin aspart  0-9 Units Subcutaneous TID WC  . Linaclotide  145 mcg Oral Daily  . LORazepam  1 mg Oral TID  . losartan  100 mg Oral Daily  . pantoprazole  40 mg Oral Q1200  . sodium chloride  3 mL Intravenous Q12H    Time spent on care of this patient: 25 mins   Maeley Matton, MD, FACP, FHM. [facility] Pager [phone]  If 7PM-7AM, please contact night-coverage [email or web address] Password [email or web address] 07/27/2014, 1:53 PM   LOS: 4 days

## 2014-07-27 NOTE — Progress Notes (Signed)
Per MD, okay for no iv access at this time.

## 2014-07-27 NOTE — Progress Notes (Signed)
Pt currently calm and resting at this time. Denies any type of hallucinations.

## 2014-07-28 LAB — GLUCOSE, CAPILLARY
Glucose-Capillary: 117 mg/dL — ABNORMAL HIGH (ref 70–99)
Glucose-Capillary: 155 mg/dL — ABNORMAL HIGH (ref 70–99)

## 2014-07-28 MED ORDER — LORAZEPAM 1 MG PO TABS: 1.0000 mg | ORAL_TABLET | Freq: Three times a day (TID) | ORAL | Status: DC

## 2014-07-28 MED ORDER — CARVEDILOL 3.125 MG PO TABS: 3.1250 mg | ORAL_TABLET | Freq: Two times a day (BID) | ORAL | Status: DC

## 2014-07-28 MED ORDER — SERTRALINE HCL 25 MG PO TABS: 25.0000 mg | ORAL_TABLET | Freq: Every day | ORAL | Status: DC

## 2014-07-28 MED ORDER — HYDROCODONE-ACETAMINOPHEN 5-325 MG PO TABS: ORAL_TABLET | ORAL | Status: DC

## 2014-07-28 NOTE — Discharge Summary (Signed)
Physician Discharge Summary  [name] OMB:[id number] DOB: Jan 30, 1946 DOA: 07/23/2014  PCP: [name], MD  Admit date: 07/23/2014 Discharge date: 07/28/2014  Time spent: Greater than 30 minutes  Recommendations for Outpatient Follow-up:  1. Dr. [name], PCP in 3 days with repeat labs (CMP). Consider further evaluation of abnormal LFTs. Please follow final blood culture results that were sent from the hospital. 2. Dr Chucky May, Psychiatry in 1 week.  Discharge Diagnoses:  Principal Problem:   Mental status alteration Active Problems:   Vitamin B12 deficiency anemia   HYDROCEPHALUS, NORMAL PRESSURE   Essential hypertension   Altered mental status   Hypokalemia   Overdose   Discharge Condition: Improved & Stable  Diet recommendation: Heart healthy and diabetic diet.  Filed Weights   07/23/14 2020  Weight: 104.3 kg (229 lb 15 oz)    History of present illness:  69 yo female with a VP shunt, depression, chronic pain on opiates, and DM who presented to the ED with several days of worsening mental status. Family reported that pt had been complaining of worse knee pains and subsequently has been taking a larger amount of Vicodin over 3-4 days. In the ED, pt's mentation slightly improved with narcan. Following admission, it was discovered that a total of 17 1mg  ativan tablets were missing from a Rx bottle filled ~24hrs prior to admission.  Hospital Course:   1. Altered mental status/unintentional and accidental drug OD (narcotics and benzodiazepines): CT head without acute findings. Neurosurgery evaluated and did not see any complications with the VP shunt. Patient was on polypharmacy at home including opioids, benzodiazepines and multiple psychiatric medications. As discussed with patient and Ms. [name], patient's friend, patient apparently has been taking a lot of these medications and forgets that she has taken them and repeats doses. History thereby was  suggestive of overuse of these medications especially narcotics and Ativan. Discussed at length with patient and Ms. Robin on multiple occasions: Patient's medications need to be taken away from her and kept under lock and key and administered by someone other than her and Ms. Robin verbalized understanding. Patient had been taking lorazepam 1 MG 4 times daily scheduled for years. Have reduced by 25% to 1 mg 3 times a day and this can be gradually tapered as outpatient either by her PCP ordered by her psychiatrist. Would not rapidly tapered due to risk for withdrawal seizures. Reduced the dose of Vicodin at discharge. 2. History of frequent falls: Likely precipitated by polypharmacy, artery mental status and associated unsteady gait and orthostatic hypotension. Minimize psychotropic medications. Home health PT and OT arranged. As per Ms. [name], between her, her daughter and the daughter's boyfriend, they are able to provide 24/7 supervision and assistance for the patient at home. 3. Orthostatic hypotension: Clinically euvolemic. Placed bilateral lower extremity compression stockings and recheck blood pressure with improvement. Advised patient and caregiver regarding minimizing symptoms and maneuvers for orthostatic hypotension and he verbalized understanding. Some of this may also be related to medications. 4. Mildly normal LFTs: Unclear etiology. Patient seems to have had chronic mildly abnormal LFTs dating back to July 2015. Repeated acute hepatitis panel testing 3 at that time were negative. Follow-up with PCP to consider further outpatient evaluation as deemed necessary. LFTs improved slightly compared to admission and have been stable. Acetaminophen level <10. 5. Diet controlled DM 2: CBGs at been reasonably well controlled in the hospital. 6. History of hydrocephalus status post VP shunt: As stated above neurosurgery did not see any  issues with the shunt and signed off. 7. Essential hypertension:  Fluctuating and mildly uncontrolled. Continue current dose of carvedilol and ARB. 8. History of chronic pain: Please see discussion per problem #1. Minimize opioids as much as possible. 9. Depression and anxiety disorder: Psychiatry was consulted and recommended starting Zoloft 25 MG daily. She was on polypharmacy prior to admission which have been discontinued. Recommend outpatient follow-up with psychiatry for further management. 10. History of GERD: Continue PPI. 11. Hypokalemia: Replaced  Consultations:  Neurosurgery  Psychiatry  Procedures:  None    Discharge Exam:  Complaints:  Patient denies complaints and is anxious to go home. As per caregiver Ms. Robin, patient's mental status has significantly improved and almost back to baseline. Patient denies any pain issues at this time. No reported hallucinations for a couple of days as per nursing.  Filed Vitals:   07/27/14 1423 07/27/14 2138 07/28/14 0550 07/28/14 1323  BP: 151/70 145/65 149/52 157/86  Pulse: 78 79 69 80  Temp: 99 F (37.2 C) 98.3 F (36.8 C) 98.4 F (36.9 C) 98 F (36.7 C)  TempSrc: Oral Oral Oral Oral  Resp: 20 16 15 18   Height:      Weight:      SpO2: 93% 95% 95% 91%   General: No acute respiratory distress. Moderately built and obese female lying comfortably supine in bed. Lungs: Clear to auscultation bilaterally without wheezes or crackles. No increased work of breathing. Cardiovascular: Regular rate and rhythm without murmur gallop or rub normal S1 and S2 Abdomen: Nontender, nondistended, soft, bowel sounds positive, no rebound, no ascites, no appreciable mass Extremities: No significant cyanosis, clubbing, or edema bilateral lower extremities CNS: Alert and oriented. No focal neurological deficits. No tremors noticed at this time or hallucinations. Psychiatric: Pleasant and appropriate. No SI/HI  Discharge Instructions      Discharge Instructions    Call MD for:  extreme fatigue    Complete  by:  As directed      Call MD for:  persistant dizziness or light-headedness    Complete by:  As directed      Call MD for:    Complete by:  As directed   Altered mental status or confusion.     Diet - low sodium heart healthy    Complete by:  As directed      Diet Carb Modified    Complete by:  As directed      Discharge instructions    Complete by:  As directed   Use bilateral lower extremity thigh high compression stockings.     Increase activity slowly    Complete by:  As directed             Medication List    STOP taking these medications        BRINTELLIX 10 MG Tabs  Generic drug:  Vortioxetine HBr     busPIRone 7.5 MG tablet  Commonly known as:  BUSPAR     gabapentin 300 MG capsule  Commonly known as:  NEURONTIN     metoCLOPramide 5 MG tablet  Commonly known as:  REGLAN     OLANZapine-FLUoxetine 6-25 MG per capsule  Commonly known as:  SYMBYAX     promethazine 12.5 MG tablet  Commonly known as:  PHENERGAN     tiZANidine 4 MG capsule  Commonly known as:  ZANAFLEX     traZODone 50 MG tablet  Commonly known as:  DESYREL      TAKE these medications  aspirin EC 325 MG tablet  Take 325 mg by mouth daily.     carvedilol 3.125 MG tablet  Commonly known as:  COREG  Take 1 tablet (3.125 mg total) by mouth 2 (two) times daily with a meal.     diclofenac sodium 1 % Gel  Commonly known as:  VOLTAREN  Apply 4 g topically 4 (four) times daily.     HYDROcodone-acetaminophen 5-325 MG per tablet  Commonly known as:  NORCO/VICODIN  Take one tablet by mouth every 12 hours as needed for moderate pain     Linaclotide 145 MCG Caps capsule  Commonly known as:  LINZESS  Take 1 capsule (145 mcg total) by mouth daily.     LORazepam 1 MG tablet  Commonly known as:  ATIVAN  Take 1 tablet (1 mg total) by mouth 3 (three) times daily.     losartan 100 MG tablet  Commonly known as:  COZAAR  Take 100 mg by mouth daily.     pantoprazole 40 MG tablet  Commonly  known as:  PROTONIX  Take 40 mg by mouth daily.     sertraline 25 MG tablet  Commonly known as:  ZOLOFT  Take 1 tablet (25 mg total) by mouth daily.     solifenacin 10 MG tablet  Commonly known as:  VESICARE  Take 1 tablet (10 mg total) by mouth daily.       Follow-up Information    Follow up with [facility].   Specialty:  Home Health Services   Why:  hhpt   Contact information:   [address] [place] [address] [phone]       Follow up with [name], MD. Schedule an appointment as soon as possible for a visit in 3 days.   Specialty:  Internal Medicine   Why:  To be seen with repeat labs (CMP).   Contact information:   [address] [place] [address] [phone]       Follow up with Derrel Nip, MD. Schedule an appointment as soon as possible for a visit in 1 week.   Specialty:  Psychiatry   Contact information:   [place] [address] [place] [address] [phone]        The results of significant diagnostics from this hospitalization (including imaging, microbiology, ancillary and laboratory) are listed below for reference.    Significant Diagnostic Studies: Ct Head Wo Contrast  07/23/2014   CLINICAL DATA:  68 year old female with altered mental status for several days, worsening. History of VP shunt. Initial encounter.  EXAM: CT HEAD WITHOUT CONTRAST  TECHNIQUE: Contiguous axial images were obtained from the base of the skull through the vertex without intravenous contrast.  COMPARISON:  04/02/2014 and earlier.  FINDINGS: Visualized paranasal sinuses and mastoids are clear. No acute osseous abnormality identified. No acute orbit or scalp soft tissue findings. Chronic right posterior approach shunt with stable appearance of the reservoir and visible shunt tubing descending in the right posterior neck.  Calcified atherosclerosis at the skull base. Stable configuration of  the intracranial portion of the ventriculostomy catheter terminating at the septum callosum. Mildly smaller ventricles. Subtle increased extra-axial fluid bilaterally (series 2, image 15). No associated mass effect or midline shift.  No evidence of intracranial mass lesion. No acute intracranial hemorrhage identified. No evidence of cortically based acute infarction identified. Gray-white matter differentiation is within normal limits throughout the brain. No suspicious intracranial vascular  hyperdensity.  IMPRESSION: 1. Stable CSF shunt. Mildly smaller ventricles since September, associated with subtle bilateral extra-axial fluid, such that it is difficult to exclude mild over-shunting. 2. Otherwise negative for age noncontrast CT appearance of the brain.   Electronically Signed   By: Lars Pinks M.D.   On: 07/23/2014 16:28   Dg Chest Port 1 View  07/23/2014   CLINICAL DATA:  Slurred speech, nonresponsive  EXAM: PORTABLE CHEST - 1 VIEW  COMPARISON:  03/28/2014  FINDINGS: Cardiomegaly again noted. Stable chronic interstitial prominence and mild bronchitic changes. No segmental infiltrate or pulmonary edema. Right VP shunt catheter partially visualized.  IMPRESSION: No segmental infiltrate or pulmonary edema. Stable chronic interstitial prominence and bronchitic changes. Cardiomegaly again noted.   Electronically Signed   By: Lahoma Crocker M.D.   On: 07/23/2014 16:05   Dg Knee Complete 4 Views Left  07/18/2014   CLINICAL DATA:  History of multiple falls.  Initial evaluation.  EXAM: LEFT KNEE - COMPLETE 4+ VIEW  COMPARISON:  03/05/2014.  FINDINGS: Severe tricompartment degenerative changes noted particularly prominent in the patellofemoral joint space. These findings are stable. No acute abnormality identified. No evidence of fracture.  IMPRESSION: 1. No acute abnormality. 2. Severe tricompartment degenerative change. Particularly prominent within the patellofemoral space.   Electronically Signed   By: Marcello Moores   Register   On: 07/18/2014 14:41    Microbiology: Recent Results (from the past 240 hour(s))  Culture, blood (routine x 2)     Status: None (Preliminary result)   Collection Time: 07/23/14  3:20 PM  Result Value Ref Range Status   Specimen Description BLOOD LEFT ARM  Final   Special Requests BOTTLES DRAWN AEROBIC ONLY 3CC  Final   Culture  Setup Time   Final    07/23/2014 21:17 Performed at [facility]    Culture   Final           BLOOD CULTURE RECEIVED NO GROWTH TO DATE CULTURE WILL BE HELD FOR 5 DAYS BEFORE ISSUING A FINAL NEGATIVE REPORT Performed at [facility]    Report Status PENDING  Incomplete  Culture, blood (routine x 2)     Status: None (Preliminary result)   Collection Time: 07/23/14  3:30 PM  Result Value Ref Range Status   Specimen Description BLOOD HAND RIGHT  Final   Special Requests BOTTLES DRAWN AEROBIC AND ANAEROBIC 5CC  Final   Culture  Setup Time   Final    07/23/2014 21:15 Performed at [facility]    Culture   Final           BLOOD CULTURE RECEIVED NO GROWTH TO DATE CULTURE WILL BE HELD FOR 5 DAYS BEFORE ISSUING A FINAL NEGATIVE REPORT Performed at [facility]    Report Status PENDING  Incomplete  Culture, Urine     Status: None   Collection Time: 07/24/14  3:51 PM  Result Value Ref Range Status   Specimen Description URINE, RANDOM  Final   Special Requests NONE  Final   Culture  Setup Time   Final    07/24/2014 21:00 Performed at [place] Performed at [facility]   Final   Culture NO GROWTH Performed at [facility]   Final   Report Status 07/25/2014 FINAL  Final     Labs: Basic Metabolic Panel:  Recent Labs Lab 07/23/14 1520 07/23/14 1937 07/24/14 0248 07/25/14 0620 07/26/14 0545  NA 140  --  137 141 138  K 4.2  --  3.8 3.0* 4.4  CL 104  --  107 114* 105  CO2 24  --  25 21 22   GLUCOSE 169*  --  168* 113* 139*  BUN 16  --  12 6 <5*   CREATININE 1.04 0.86 0.71 0.61 0.87  CALCIUM 9.0  --  7.7* 7.2* 9.3  MG  --   --   --  1.5  --    Liver Function Tests:  Recent Labs Lab 07/23/14 1520 07/24/14 0248 07/25/14 0620 07/26/14 0545  AST 156* 121* 89* 87*  ALT 69* 55* 47* 52*  ALKPHOS 109 92 86 120*  BILITOT 1.2 1.3* 1.1 1.6*  PROT 6.5 5.1* 5.0* 7.1  ALBUMIN 3.1* 2.4* 2.2* 3.2*   No results for input(s): LIPASE, AMYLASE in the last 168 hours.  Recent Labs Lab 07/23/14 1520 07/25/14 0615  AMMONIA 37* 36*   CBC:  Recent Labs Lab 07/23/14 1520 07/23/14 1937 07/24/14 0248 07/25/14 0620 07/26/14 0545 07/27/14 0428  WBC 5.6 5.1 3.1* 3.7* 6.2 5.3  NEUTROABS 4.0  --   --   --   --   --   HGB 12.3 13.6 10.7* 11.1* 14.5 14.3  HCT 38.6 40.7 33.4* 34.7* 43.7 43.3  MCV 92.6 93.8 95.2 91.8 92.8 93.1  PLT 232 179 161 167 211 205   Cardiac Enzymes: No results for input(s): CKTOTAL, CKMB, CKMBINDEX, TROPONINI in the last 168 hours. BNP: BNP (last 3 results) No results for input(s): PROBNP in the last 8760 hours. CBG:  Recent Labs Lab 07/27/14 1143 07/27/14 1631 07/27/14 2133 07/28/14 0752 07/28/14 1200  GLUCAP 137* 147* 148* 117* 155*      Signed:  Vernell Leep, MD, FACP, FHM. [facility] Pager [phone]  If 7PM-7AM, please contact night-coverage [email or web address] Password [email or web address] 07/28/2014, 2:22 PM

## 2014-07-28 NOTE — Progress Notes (Signed)
DC home with sister. Verbally read DC instructions to pt and both verbally stated they understood, no questions ask.

## 2014-07-29 LAB — CULTURE, BLOOD (ROUTINE X 2)
Culture: NO GROWTH
Culture: NO GROWTH

## 2014-07-30 ENCOUNTER — Telehealth: Payer: Self-pay | Admitting: *Deleted

## 2014-07-30 NOTE — Telephone Encounter (Signed)
Transition Care Management Follow-up Telephone Call D/C 07/28/14  How have you been since you were released from the hospital? Pt states she is doing fine   Do you understand why you were in the hospital? YES   Do you understand the discharge instrcutions? Pt stated yes but she didn't have d/c near her but she stated she understood everything  Items Reviewed:  Medications reviewed: YES, reviewed  Allergies reviewed: YES, reviewed  Dietary changes reviewed: YES, diabetic & heart healthy diet Referrals reviewed: No referrals recommended  Functional Questionnaire:   Activities of Daily Living (ADLs):   She states she are independent in the following: ambulation, bathing and hygiene, feeding, toileting and dressing She states she doesn't require assistance    Any transportation issues/concerns?: NO   Any patient concerns? NO   Confirmed importance and date/time of follow-up visits scheduled: Yes pt had already called and made appt for 08/05/14 advise pt to keep appt   Confirmed with patient if condition begins to worsen call PCP or go to the ER.

## 2014-08-05 ENCOUNTER — Ambulatory Visit: Payer: Self-pay | Admitting: Internal Medicine

## 2014-08-13 ENCOUNTER — Ambulatory Visit: Payer: Self-pay | Admitting: Family Medicine

## 2014-08-14 ENCOUNTER — Ambulatory Visit: Payer: Self-pay | Admitting: Internal Medicine

## 2014-08-20 DIAGNOSIS — R4182 Altered mental status, unspecified: Secondary | ICD-10-CM | POA: Diagnosis not present

## 2014-10-29 ENCOUNTER — Institutional Professional Consult (permissible substitution): Payer: 59 | Admitting: Neurology

## 2014-10-31 ENCOUNTER — Encounter: Payer: Self-pay | Admitting: Neurology

## 2014-12-23 ENCOUNTER — Emergency Department (HOSPITAL_COMMUNITY)
Admission: EM | Admit: 2014-12-23 | Discharge: 2014-12-23 | Discharge status: Against Medical Advice | Payer: 59 | Attending: Emergency Medicine | Admitting: Emergency Medicine

## 2014-12-23 ENCOUNTER — Encounter (HOSPITAL_COMMUNITY): Payer: Self-pay | Admitting: Emergency Medicine

## 2014-12-23 DIAGNOSIS — I10 Essential (primary) hypertension: Secondary | ICD-10-CM | POA: Insufficient documentation

## 2014-12-23 DIAGNOSIS — F419 Anxiety disorder, unspecified: Secondary | ICD-10-CM | POA: Insufficient documentation

## 2014-12-23 DIAGNOSIS — E119 Type 2 diabetes mellitus without complications: Secondary | ICD-10-CM | POA: Insufficient documentation

## 2014-12-23 DIAGNOSIS — F410 Panic disorder [episodic paroxysmal anxiety] without agoraphobia: Secondary | ICD-10-CM | POA: Insufficient documentation
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
## 2014-12-23 NOTE — ED Notes (Addendum)
Pt requesting to leave, states that she does not know where her car is and that's why she is anxious. Pt reassured that she would be seen as soon as possible but pt requests to leave. Security called to drive pt to her car, security noted to have parked it earlier today. Pt in nad noted as leaving Ed.

## 2014-12-24 ENCOUNTER — Encounter (HOSPITAL_COMMUNITY): Payer: Self-pay | Admitting: Cardiology

## 2014-12-24 ENCOUNTER — Emergency Department (HOSPITAL_COMMUNITY)
Admission: EM | Admit: 2014-12-24 | Discharge: 2014-12-24 | Disposition: A | Discharge status: Home / Self Care | Payer: Medicare Other | Attending: Emergency Medicine | Admitting: Emergency Medicine

## 2014-12-24 ENCOUNTER — Emergency Department (HOSPITAL_COMMUNITY): Payer: Medicare Other

## 2014-12-24 DIAGNOSIS — Z7982 Long term (current) use of aspirin: Secondary | ICD-10-CM | POA: Diagnosis not present

## 2014-12-24 DIAGNOSIS — E119 Type 2 diabetes mellitus without complications: Secondary | ICD-10-CM | POA: Diagnosis not present

## 2014-12-24 DIAGNOSIS — Z79899 Other long term (current) drug therapy: Secondary | ICD-10-CM | POA: Diagnosis not present

## 2014-12-24 DIAGNOSIS — Z87448 Personal history of other diseases of urinary system: Secondary | ICD-10-CM | POA: Diagnosis not present

## 2014-12-24 DIAGNOSIS — Z880 Allergy status to penicillin: Secondary | ICD-10-CM | POA: Diagnosis not present

## 2014-12-24 DIAGNOSIS — Z791 Long term (current) use of non-steroidal anti-inflammatories (NSAID): Secondary | ICD-10-CM | POA: Diagnosis not present

## 2014-12-24 DIAGNOSIS — K219 Gastro-esophageal reflux disease without esophagitis: Secondary | ICD-10-CM | POA: Diagnosis not present

## 2014-12-24 DIAGNOSIS — Z8669 Personal history of other diseases of the nervous system and sense organs: Secondary | ICD-10-CM | POA: Diagnosis not present

## 2014-12-24 DIAGNOSIS — F329 Major depressive disorder, single episode, unspecified: Secondary | ICD-10-CM | POA: Diagnosis not present

## 2014-12-24 DIAGNOSIS — I10 Essential (primary) hypertension: Secondary | ICD-10-CM | POA: Diagnosis not present

## 2014-12-24 DIAGNOSIS — F419 Anxiety disorder, unspecified: Secondary | ICD-10-CM | POA: Diagnosis present

## 2014-12-24 DIAGNOSIS — E876 Hypokalemia: Secondary | ICD-10-CM | POA: Insufficient documentation

## 2014-12-24 DIAGNOSIS — R000 Tachycardia, unspecified: Secondary | ICD-10-CM | POA: Diagnosis not present

## 2014-12-24 DIAGNOSIS — Z87828 Personal history of other (healed) physical injury and trauma: Secondary | ICD-10-CM | POA: Insufficient documentation

## 2014-12-24 LAB — CBC WITH DIFFERENTIAL/PLATELET
Basophils Absolute: 0.1 10*3/uL (ref 0.0–0.1)
Basophils Relative: 1 % (ref 0–1)
Eosinophils Absolute: 0.1 10*3/uL (ref 0.0–0.7)
Eosinophils Relative: 1 % (ref 0–5)
HCT: 42 % (ref 36.0–46.0)
Hemoglobin: 15.1 g/dL — ABNORMAL HIGH (ref 12.0–15.0)
Lymphocytes Relative: 28 % (ref 12–46)
Lymphs Abs: 2.3 10*3/uL (ref 0.7–4.0)
MCH: 32.5 pg (ref 26.0–34.0)
MCHC: 36 g/dL (ref 30.0–36.0)
MCV: 90.3 fL (ref 78.0–100.0)
Monocytes Absolute: 0.8 10*3/uL (ref 0.1–1.0)
Monocytes Relative: 9 % (ref 3–12)
Neutro Abs: 5.1 10*3/uL (ref 1.7–7.7)
Neutrophils Relative %: 61 % (ref 43–77)
Platelets: 230 10*3/uL (ref 150–400)
RBC: 4.65 MIL/uL (ref 3.87–5.11)
RDW: 12.1 % (ref 11.5–15.5)
WBC: 8.4 10*3/uL (ref 4.0–10.5)

## 2014-12-24 LAB — COMPREHENSIVE METABOLIC PANEL
ALT: 16 U/L (ref 14–54)
AST: 26 U/L (ref 15–41)
Albumin: 4.1 g/dL (ref 3.5–5.0)
Alkaline Phosphatase: 68 U/L (ref 38–126)
Anion gap: 16 — ABNORMAL HIGH (ref 5–15)
BUN: 22 mg/dL — ABNORMAL HIGH (ref 6–20)
CO2: 18 mmol/L — ABNORMAL LOW (ref 22–32)
Calcium: 9.9 mg/dL (ref 8.9–10.3)
Chloride: 103 mmol/L (ref 101–111)
Creatinine, Ser: 1.14 mg/dL — ABNORMAL HIGH (ref 0.44–1.00)
GFR calc Af Amer: 56 mL/min — ABNORMAL LOW (ref >60)
GFR calc non Af Amer: 49 mL/min — ABNORMAL LOW (ref >60)
Glucose, Bld: 139 mg/dL — ABNORMAL HIGH (ref 65–99)
Potassium: 3.1 mmol/L — ABNORMAL LOW (ref 3.5–5.1)
Sodium: 137 mmol/L (ref 135–145)
Total Bilirubin: 1.2 mg/dL (ref 0.3–1.2)
Total Protein: 8 g/dL (ref 6.5–8.1)

## 2014-12-24 LAB — I-STAT TROPONIN, ED: Troponin i, poc: 0 ng/mL (ref 0.00–0.08)

## 2014-12-24 MED ORDER — ONDANSETRON 4 MG PO TBDP
4.0000 mg | ORAL_TABLET | Freq: Once | ORAL | Status: AC
  Administered 2014-12-24: 4 mg via ORAL
  Filled 2014-12-24: qty 1

## 2014-12-24 MED ORDER — HYDROXYZINE HCL 25 MG PO TABS: 25.0000 mg | ORAL_TABLET | Freq: Three times a day (TID) | ORAL | Status: DC | PRN

## 2014-12-24 MED ORDER — LORAZEPAM 1 MG PO TABS
1.0000 mg | ORAL_TABLET | Freq: Once | ORAL | Status: AC
  Administered 2014-12-24: 1 mg via ORAL
  Filled 2014-12-24: qty 1

## 2014-12-24 MED ORDER — POTASSIUM CHLORIDE CRYS ER 20 MEQ PO TBCR
40.0000 meq | EXTENDED_RELEASE_TABLET | Freq: Once | ORAL | Status: AC
  Administered 2014-12-24: 40 meq via ORAL
  Filled 2014-12-24: qty 2

## 2014-12-24 MED ORDER — LORAZEPAM 0.5 MG PO TABS
1.0000 mg | ORAL_TABLET | Freq: Once | ORAL | Status: AC
  Administered 2014-12-24: 1 mg via ORAL
  Filled 2014-12-24: qty 2

## 2014-12-24 MED ORDER — HYDROXYZINE HCL 25 MG PO TABS
25.0000 mg | ORAL_TABLET | Freq: Once | ORAL | Status: AC
  Administered 2014-12-24: 25 mg via ORAL
  Filled 2014-12-24: qty 1

## 2014-12-24 NOTE — ED Provider Notes (Signed)
  Face-to-face evaluation   History: She complains of "panic attack". She had a situation with a caregiver, but has that under control, currently. She is a therapist and psychiatrist.  Physical exam: As of 19:45, is alert, cooperative, anxious, and his heart rate 113. There is no respiratory distress. She is lucid at this time  Medical screening examination/treatment/procedure(s) were conducted as a shared visit with non-physician practitioner(s) and myself.  I personally evaluated the patient during the encounter  Daleen Bo, MD 12/25/14 2304
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
## 2014-12-24 NOTE — ED Notes (Signed)
Pt. Requesting female to help her with her incontinence and changing. Nurse and EMT males, female tech en route.

## 2014-12-24 NOTE — Discharge Instructions (Signed)
Return to the emergency room with worsening of symptoms, new symptoms or with symptoms that are concerning, especially chest pain, shortness of breath, nausea, vomiting, fevers, chills, thoughts of suicide. Please call your doctor for a followup appointment within 24-48 hours. When you talk to your doctor please let them know that you were seen in the emergency department and have them acquire all of your records so that they can discuss the findings with you and formulate a treatment plan to fully care for your new and ongoing problems.  Follow up with your counselor as planned this week. Read below information and follow recommendations.  Panic Attacks Panic attacks are sudden, short-livedsurges of severe anxiety, fear, or discomfort. They may occur for no reason when you are relaxed, when you are anxious, or when you are sleeping. Panic attacks may occur for a number of reasons:   Healthy people occasionally have panic attacks in extreme, life-threatening situations, such as war or natural disasters. Normal anxiety is a protective mechanism of the body that helps [place] react to danger (fight or flight response).  Panic attacks are often seen with anxiety disorders, such as panic disorder, social anxiety disorder, generalized anxiety disorder, and phobias. Anxiety disorders cause excessive or uncontrollable anxiety. They may interfere with your relationships or other life activities.  Panic attacks are sometimes seen with other mental illnesses, such as depression and posttraumatic stress disorder.  Certain medical conditions, prescription medicines, and drugs of abuse can cause panic attacks. SYMPTOMS  Panic attacks start suddenly, peak within 20 minutes, and are accompanied by four or more of the following symptoms:  Pounding heart or fast heart rate (palpitations).  Sweating.  Trembling or shaking.  Shortness of breath or feeling smothered.  Feeling choked.  Chest pain or  discomfort.  Nausea or strange feeling in your stomach.  Dizziness, light-headedness, or feeling like you will faint.  Chills or hot flushes.  Numbness or tingling in your lips or hands and feet.  Feeling that things are not real or feeling that you are not yourself.  Fear of losing control or going crazy.  Fear of dying. Some of these symptoms can mimic serious medical conditions. For example, you may think you are having a heart attack. Although panic attacks can be very scary, they are not life threatening. DIAGNOSIS  Panic attacks are diagnosed through an assessment by your health care provider. Your health care provider will ask questions about your symptoms, such as where and when they occurred. Your health care provider will also ask about your medical history and use of alcohol and drugs, including prescription medicines. Your health care provider may order blood tests or other studies to rule out a serious medical condition. Your health care provider may refer you to a mental health professional for further evaluation. TREATMENT   Most healthy people who have one or two panic attacks in an extreme, life-threatening situation will not require treatment.  The treatment for panic attacks associated with anxiety disorders or other mental illness typically involves counseling with a mental health professional, medicine, or a combination of both. Your health care provider will help determine what treatment is best for you.  Panic attacks due to physical illness usually go away with treatment of the illness. If prescription medicine is causing panic attacks, talk with your health care provider about stopping the medicine, decreasing the dose, or substituting another medicine.  Panic attacks due to alcohol or drug abuse go away with abstinence. Some adults need professional help  in order to stop drinking or using drugs. HOME CARE INSTRUCTIONS   Take all medicines as directed by your  health care provider.   Schedule and attend follow-up visits as directed by your health care provider. It is important to keep all your appointments. SEEK MEDICAL CARE IF:  You are not able to take your medicines as prescribed.  Your symptoms do not improve or get worse. SEEK IMMEDIATE MEDICAL CARE IF:   You experience panic attack symptoms that are different than your usual symptoms.  You have serious thoughts about hurting yourself or others.  You are taking medicine for panic attacks and have a serious side effect. MAKE SURE YOU:  Understand these instructions.  Will watch your condition.  Will get help right away if you are not doing well or get worse. Document Released: 07/19/2005 Document Revised: 07/24/2013 Document Reviewed: 03/02/2013 [facility] Patient Information 2015 [place], [place]. This information is not intended to replace advice given to you by your health care provider. Make sure you discuss any questions you have with your health care provider.

## 2014-12-24 NOTE — ED Provider Notes (Signed)
CSN: [id number]     Arrival date & time 12/24/14  1255 History   First MD Initiated Contact with Patient 12/24/14 1527     Chief Complaint  Patient presents with  . Anxiety  . Hypertension     (Consider location/radiation/quality/duration/timing/severity/associated sxs/prior Treatment) HPI  [name] is a 69 y.o. female with PMH of depression, anxiety, HTN, DM, hydrocephalus presenting with reported anxiety attacks since last night. Patient states she has a caregiver to Dr. hydrocephalus and incontinence. This caregiver has followed jointly used her data card has been fired. Patient to get a restraining order on her. She feels safe in her home and has new care provider. Patient saw her psychiatrist last week. Patient felt anxious more of her Ativan than normal. When she saw her psychiatrist she refused to refill her medication until June. She has had no Ativan for the past week. Patient states she has an appointment this week with her counselor. She is presenting today with concerns of anxiety and high blood pressure. No headache, shortness of breath, peripheral edema, headache, visual changes, slurred speech, weakness. No hematuria. Patient denies SI, HI, hallucinations, alcohol use, illicit drug use.   Past Medical History  Diagnosis Date  . Depression   . Anxiety   . Hypertension   . MVP (mitral valve prolapse)   . Hydrocephalus   . Diabetes mellitus without complication   . Sinus bradycardia   . Dizziness   . Falls   . Acute kidney injury   . Transaminitis   . Hypotension   . Acid reflux disease    Past Surgical History  Procedure Laterality Date  . Cholecystectomy    . Brain surgery    . Ercp N/A 02/28/2014    Procedure: ENDOSCOPIC RETROGRADE CHOLANGIOPANCREATOGRAPHY (ERCP);  Surgeon: Gatha Mayer, MD;  Location: [facility] ENDOSCOPY;  Service: Endoscopy;  Laterality: N/A;  talked to tiffany from or /ebp  . Esophagogastroduodenoscopy N/A 02/28/2014    Procedure:  ESOPHAGOGASTRODUODENOSCOPY (EGD);  Surgeon: Gatha Mayer, MD;  Location: [facility] ENDOSCOPY;  Service: Endoscopy;  Laterality: N/A;  . Ventriculoperitoneal shunt      right occipital   Family History  Problem Relation Age of Onset  . Heart disease Mother   . Stroke Mother   . Alcohol abuse Father   . Diabetes Father   . Cancer Neg Hx   . Hypertension Mother    History  Substance Use Topics  . Smoking status: Never Smoker   . Smokeless tobacco: Never Used  . Alcohol Use: No   OB History    No data available     Review of Systems 10 Systems reviewed and are negative for acute change except as noted in the HPI.    Allergies  Metformin and related; Ace inhibitors; Erythromycin; and Penicillins  Home Medications   Prior to Admission medications   Medication Sig Start Date End Date Taking? Authorizing Provider  LORazepam (ATIVAN) 1 MG tablet Take 1 tablet (1 mg total) by mouth 3 (three) times daily. 07/28/14  Yes Modena Jansky, MD  sertraline (ZOLOFT) 25 MG tablet Take 1 tablet (25 mg total) by mouth daily. 07/28/14  Yes Modena Jansky, MD  aspirin EC 325 MG tablet Take 325 mg by mouth daily. 05/08/14   Historical Provider, MD  carvedilol (COREG) 3.125 MG tablet Take 1 tablet (3.125 mg total) by mouth 2 (two) times daily with a meal. 07/28/14   Modena Jansky, MD  diclofenac sodium (VOLTAREN) 1 % GEL  Apply 4 g topically 4 (four) times daily. 07/22/14   Janith Lima, MD  HYDROcodone-acetaminophen (NORCO/VICODIN) 5-325 MG per tablet Take one tablet by mouth every 12 hours as needed for moderate pain 07/28/14   Modena Jansky, MD  hydrOXYzine (ATARAX/VISTARIL) 25 MG tablet Take 1 tablet (25 mg total) by mouth every 8 (eight) hours as needed. 12/24/14   Al Corpus, PA-C  Linaclotide [facility]) 145 MCG CAPS capsule Take 1 capsule (145 mcg total) by mouth daily. 05/22/14   Janith Lima, MD  pantoprazole (PROTONIX) 40 MG tablet Take 40 mg by mouth daily.  05/08/14   Historical  Provider, MD  solifenacin (VESICARE) 10 MG tablet Take 1 tablet (10 mg total) by mouth daily. 07/18/14   Janith Lima, MD   BP 178/91 mmHg  Pulse 120  Temp(Src) 97.9 F (36.6 C) (Oral)  Resp 26  Wt 228 lb (103.42 kg)  SpO2 97% Physical Exam  Constitutional: She appears well-developed and well-nourished. No distress.  HENT:  Head: Normocephalic and atraumatic.  Eyes: Conjunctivae and EOM are normal. Right eye exhibits no discharge. Left eye exhibits no discharge.  Neck: No JVD present.  Cardiovascular: Normal rate and regular rhythm.   No leg swelling or tenderness. Negative Homan's sign.  Pulmonary/Chest: Effort normal and breath sounds normal. No respiratory distress. She has no wheezes.  Abdominal: Soft. Bowel sounds are normal. She exhibits no distension. There is no tenderness.  Neurological: She is alert. She exhibits normal muscle tone. Coordination normal.  Skin: Skin is warm and dry. She is not diaphoretic.  Psychiatric: Her mood appears anxious. Her speech is rapid and/or pressured.  Nursing note and vitals reviewed.   ED Course  Procedures (including critical care time) Labs Review Labs Reviewed  CBC WITH DIFFERENTIAL/PLATELET - Abnormal; Notable for the following:    Hemoglobin 15.1 (*)    All other components within normal limits  COMPREHENSIVE METABOLIC PANEL - Abnormal; Notable for the following:    Potassium 3.1 (*)    CO2 18 (*)    Glucose, Bld 139 (*)    BUN 22 (*)    Creatinine, Ser 1.14 (*)    GFR calc non Af Amer 49 (*)    GFR calc Af Amer 56 (*)    Anion gap 16 (*)    All other components within normal limits  I-STAT TROPOININ, ED    Imaging Review Dg Chest 2 View  12/24/2014   CLINICAL DATA:  Anxiety, hypertension.  EXAM: CHEST  2 VIEW  COMPARISON:  July 23, 2014.  FINDINGS: The heart size and mediastinal contours are within normal limits. No pneumothorax or pleural effusion is noted. 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 Stable  interstitial densities are noted throughout both lungs most consistent with scarring. The visualized skeletal structures are unremarkable.  IMPRESSION: Stable interstitial densities are noted throughout both lungs most consistent with scarring. No acute cardiopulmonary abnormality seen.   Electronically Signed   By: Marijo Conception, M.D.   On: 12/24/2014 17:01     EKG Interpretation   Date/Time:  Tuesday Dec 24 2014 16:10:03 EDT Ventricular Rate:  103 PR Interval:  230 QRS Duration: 100 QT Interval:  392 QTC Calculation: 513 R Axis:   -5 Text Interpretation:  Sinus tachycardia with 1st degree A-V block  Incomplete right bundle branch block Septal infarct , age undetermined  Abnormal ECG Since last tracing PR interval is longer and rate is faster  Confirmed by Eulis Foster  MD, ELLIOTT [phone])  on 12/24/2014 6:55:23 PM      Meds given in ED:  Medications  LORazepam (ATIVAN) tablet 1 mg (1 mg Oral Given 12/24/14 1553)  potassium chloride SA (K-DUR,KLOR-CON) CR tablet 40 mEq (40 mEq Oral Given 12/24/14 1819)  LORazepam (ATIVAN) tablet 1 mg (1 mg Oral Given 12/24/14 1844)  hydrOXYzine (ATARAX/VISTARIL) tablet 25 mg (25 mg Oral Given 12/24/14 1928)  ondansetron (ZOFRAN-ODT) disintegrating tablet 4 mg (4 mg Oral Given 12/24/14 1929)    Discharge Medication List as of 12/24/2014 10:11 PM    START taking these medications   Details  hydrOXYzine (ATARAX/VISTARIL) 25 MG tablet Take 1 tablet (25 mg total) by mouth every 8 (eight) hours as needed., Starting 12/24/2014, Until Discontinued, Print          MDM   Final diagnoses:  Anxiety  Tachycardia  Essential hypertension  Hypokalemia   Pt presenting with anxiety tachycardia and HTN. Pt with tachycardia and is very anxious and hyperventilating with rapid speech. Pt reports significant anxiety. No pain. No abdominal pain. Pt refusing fluids and tachycardia likely related to anxiety. Pt reports decreased anxiety with ativan. Lab work up reassuring.  Negative troponin. Sinus rhythm on EKG and on monitor. Mild hypokalemia which was orally supplemented.  Pt symptoms likely related to her anxiety. She feels safe at home and has follow up with counselor this week. Patient is afebrile, nontoxic, and in no acute distress. Patient is appropriate for outpatient management and is stable for discharge. Pt to follow up with PCP.  Discussed return precautions with patient. Discussed all results and patient verbalizes understanding and agrees with plan.  This is a shared patient. This patient was discussed with the physician who saw and evaluated the patient and agrees with the plan.    Filed Vitals:   12/24/14 2100 12/24/14 2130 12/24/14 2138 12/24/14 2200  BP: 144/84 162/82 162/82 178/91  Pulse: 108 53 115 120  Temp:      TempSrc:      Resp: 18 26 22 26   Weight:      SpO2: 97% 95% 97% 97%     Al Corpus, PA-C 12/25/14 2125  Daleen Bo, MD 12/25/14 2306

## 2014-12-24 NOTE — ED Notes (Signed)
Pt reports that she was here last night to be seen because she was having an anxiety attack. Reports she left because she thought she had to pay to be seen. States she takes anxiety medication but is out of the medication and has not been able to get a refill.

## 2014-12-24 NOTE — ED Notes (Signed)
[name], [phone], picking pt up at discharge Norm Salt - pt caregiver -

## 2014-12-24 NOTE — ED Notes (Signed)
Pt HR via pulse ox noted to be irregular, fluctuating from 50s then to 100s, placed on monitor, irregular rhythm noted, EKG to be completed

## 2014-12-27 ENCOUNTER — Encounter (HOSPITAL_COMMUNITY): Payer: Self-pay | Admitting: Emergency Medicine

## 2014-12-27 ENCOUNTER — Emergency Department (HOSPITAL_COMMUNITY)
Admission: EM | Admit: 2014-12-27 | Discharge: 2014-12-27 | Disposition: A | Discharge status: Home / Self Care | Payer: Medicare Other | Attending: Emergency Medicine | Admitting: Emergency Medicine

## 2014-12-27 DIAGNOSIS — F419 Anxiety disorder, unspecified: Secondary | ICD-10-CM | POA: Insufficient documentation

## 2014-12-27 DIAGNOSIS — Z880 Allergy status to penicillin: Secondary | ICD-10-CM | POA: Diagnosis not present

## 2014-12-27 DIAGNOSIS — I10 Essential (primary) hypertension: Secondary | ICD-10-CM | POA: Insufficient documentation

## 2014-12-27 DIAGNOSIS — K219 Gastro-esophageal reflux disease without esophagitis: Secondary | ICD-10-CM | POA: Insufficient documentation

## 2014-12-27 DIAGNOSIS — E119 Type 2 diabetes mellitus without complications: Secondary | ICD-10-CM | POA: Diagnosis not present

## 2014-12-27 DIAGNOSIS — R112 Nausea with vomiting, unspecified: Secondary | ICD-10-CM | POA: Insufficient documentation

## 2014-12-27 DIAGNOSIS — Z87448 Personal history of other diseases of urinary system: Secondary | ICD-10-CM | POA: Diagnosis not present

## 2014-12-27 DIAGNOSIS — H9190 Unspecified hearing loss, unspecified ear: Secondary | ICD-10-CM | POA: Diagnosis not present

## 2014-12-27 DIAGNOSIS — F329 Major depressive disorder, single episode, unspecified: Secondary | ICD-10-CM | POA: Diagnosis not present

## 2014-12-27 DIAGNOSIS — Z79899 Other long term (current) drug therapy: Secondary | ICD-10-CM | POA: Diagnosis not present

## 2014-12-27 LAB — I-STAT CHEM 8, ED
BUN: 22 mg/dL — ABNORMAL HIGH (ref 6–20)
Calcium, Ion: 1.13 mmol/L (ref 1.13–1.30)
Chloride: 110 mmol/L (ref 101–111)
Creatinine, Ser: 0.8 mg/dL (ref 0.44–1.00)
Glucose, Bld: 174 mg/dL — ABNORMAL HIGH (ref 65–99)
HCT: 47 % — ABNORMAL HIGH (ref 36.0–46.0)
Hemoglobin: 16 g/dL — ABNORMAL HIGH (ref 12.0–15.0)
Potassium: 3.6 mmol/L (ref 3.5–5.1)
Sodium: 141 mmol/L (ref 135–145)
TCO2: 15 mmol/L (ref 0–100)

## 2014-12-27 LAB — CBG MONITORING, ED: Glucose-Capillary: 178 mg/dL — ABNORMAL HIGH (ref 65–99)

## 2014-12-27 MED ORDER — LORAZEPAM 1 MG PO TABS: 1.0000 mg | ORAL_TABLET | Freq: Three times a day (TID) | ORAL | Status: DC | PRN

## 2014-12-27 MED ORDER — LORAZEPAM 1 MG PO TABS
1.0000 mg | ORAL_TABLET | Freq: Once | ORAL | Status: AC
  Administered 2014-12-27: 1 mg via ORAL
  Filled 2014-12-27: qty 1

## 2014-12-27 NOTE — ED Notes (Signed)
Per EMS-called out for hyperventilation with fingers tingling. Has not had anxiety medications for one week. Hx anxiety. Has been taking too many so is not able to fill her medications. Had increased stress yesterday. Denies falls. Endorses decreased sleep. Nausea x1 week. EKG unremarkable. RR WDL. Hx cutting but denies SI/HI. VS: 160/90 HR 104 RR 18 SpO2 99% CBG 98 mg/dl.

## 2014-12-27 NOTE — ED Notes (Signed)
AVS explained in detail, knows to follow up with psychiatry. Given follow up address and phone number and appointment time. Given diapers for the ride home (pt is incontinent). Walks with steady gait. No other c/c.

## 2014-12-27 NOTE — ED Provider Notes (Signed)
18:00- I received a call from pharmacist was not comfortable prescribing the patient Ativan. She states that the patient told her that she used to many of her prior prescription, which is why she is out now. I saw the patient 3 days ago and we treated her with hydroxyzine for anxiety. I authorizing additional 12 hydroxyzine tablets, to use for anxiety. I mentioned to the pharmacist that she could tell the patient to return here if needed for further evaluation.  Daleen Bo, MD 12/27/14 [phone]

## 2014-12-27 NOTE — Discharge Instructions (Signed)
An appointment has been scheduled for you at Dr.Kaur's office for Tuesday, 12/31/2014 11:30 AM. Call Dr. Ronnald Ramp on 12/31/2014 to schedule next available appointment so that your regular routine medications can be reordered for you. Your blood sugar was mildly elevated today at 174
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
## 2014-12-27 NOTE — ED Notes (Signed)
Bed: AR01 Expected date:  Expected time:  Means of arrival:  Comments: EMS- anxiety/nausea, out of anxiety meds

## 2014-12-27 NOTE — ED Notes (Signed)
Patient denies SI/HI. Says she has not eaten a full meal in 3 days. Having nausea and emesis related to anxiety starting last night. Patient is ambulatory but very shaky and is trembling. No diaphoresis noted.

## 2015-08-07 ENCOUNTER — Ambulatory Visit (INDEPENDENT_AMBULATORY_CARE_PROVIDER_SITE_OTHER)
Admission: RE | Admit: 2015-08-07 | Discharge: 2015-08-07 | Disposition: A | Discharge status: Home / Self Care | Payer: Medicare Other | Source: Ambulatory Visit | Attending: Internal Medicine | Admitting: Internal Medicine

## 2015-08-07 ENCOUNTER — Telehealth: Payer: Self-pay | Admitting: Internal Medicine

## 2015-08-07 ENCOUNTER — Ambulatory Visit (INDEPENDENT_AMBULATORY_CARE_PROVIDER_SITE_OTHER): Payer: Medicare Other | Admitting: Internal Medicine

## 2015-08-07 ENCOUNTER — Other Ambulatory Visit (INDEPENDENT_AMBULATORY_CARE_PROVIDER_SITE_OTHER): Payer: Medicare Other

## 2015-08-07 ENCOUNTER — Encounter: Payer: Self-pay | Admitting: Internal Medicine

## 2015-08-07 VITALS — BP 146/90 | HR 80 | Temp 98.3°F | Resp 16 | Ht 65.0 in | Wt 240.0 lb

## 2015-08-07 DIAGNOSIS — M899 Disorder of bone, unspecified: Secondary | ICD-10-CM | POA: Diagnosis not present

## 2015-08-07 DIAGNOSIS — R739 Hyperglycemia, unspecified: Secondary | ICD-10-CM

## 2015-08-07 DIAGNOSIS — D519 Vitamin B12 deficiency anemia, unspecified: Secondary | ICD-10-CM | POA: Diagnosis not present

## 2015-08-07 DIAGNOSIS — M5412 Radiculopathy, cervical region: Secondary | ICD-10-CM

## 2015-08-07 DIAGNOSIS — M8580 Other specified disorders of bone density and structure, unspecified site: Secondary | ICD-10-CM

## 2015-08-07 DIAGNOSIS — I10 Essential (primary) hypertension: Secondary | ICD-10-CM

## 2015-08-07 DIAGNOSIS — Z1211 Encounter for screening for malignant neoplasm of colon: Secondary | ICD-10-CM | POA: Insufficient documentation

## 2015-08-07 DIAGNOSIS — E559 Vitamin D deficiency, unspecified: Secondary | ICD-10-CM

## 2015-08-07 DIAGNOSIS — E785 Hyperlipidemia, unspecified: Secondary | ICD-10-CM

## 2015-08-07 LAB — CBC WITH DIFFERENTIAL/PLATELET
Basophils Absolute: 0 10*3/uL (ref 0.0–0.1)
Basophils Relative: 0.4 % (ref 0.0–3.0)
Eosinophils Absolute: 0.3 10*3/uL (ref 0.0–0.7)
Eosinophils Relative: 4.7 % (ref 0.0–5.0)
HCT: 45.2 % (ref 36.0–46.0)
Hemoglobin: 15.3 g/dL — ABNORMAL HIGH (ref 12.0–15.0)
Lymphocytes Relative: 28.9 % (ref 12.0–46.0)
Lymphs Abs: 1.8 10*3/uL (ref 0.7–4.0)
MCHC: 33.9 g/dL (ref 30.0–36.0)
MCV: 92.9 fl (ref 78.0–100.0)
Monocytes Absolute: 0.4 10*3/uL (ref 0.1–1.0)
Monocytes Relative: 6.7 % (ref 3.0–12.0)
Neutro Abs: 3.7 10*3/uL (ref 1.4–7.7)
Neutrophils Relative %: 59.3 % (ref 43.0–77.0)
Platelets: 278 10*3/uL (ref 150.0–400.0)
RBC: 4.86 Mil/uL (ref 3.87–5.11)
RDW: 12.5 % (ref 11.5–15.5)
WBC: 6.2 10*3/uL (ref 4.0–10.5)

## 2015-08-07 LAB — LIPID PANEL
Cholesterol: 210 mg/dL — ABNORMAL HIGH (ref 0–200)
HDL: 53.4 mg/dL (ref >39.00)
NonHDL: 156.35
Total CHOL/HDL Ratio: 4
Triglycerides: 264 mg/dL — ABNORMAL HIGH (ref 0.0–149.0)
VLDL: 52.8 mg/dL — ABNORMAL HIGH (ref 0.0–40.0)

## 2015-08-07 LAB — COMPREHENSIVE METABOLIC PANEL
ALT: 12 U/L (ref 0–35)
AST: 12 U/L (ref 0–37)
Albumin: 4.1 g/dL (ref 3.5–5.2)
Alkaline Phosphatase: 76 U/L (ref 39–117)
BUN: 20 mg/dL (ref 6–23)
CO2: 30 mEq/L (ref 19–32)
Calcium: 9.8 mg/dL (ref 8.4–10.5)
Chloride: 101 mEq/L (ref 96–112)
Creatinine, Ser: 0.74 mg/dL (ref 0.40–1.20)
GFR: 82.83 mL/min (ref >60.00)
Glucose, Bld: 124 mg/dL — ABNORMAL HIGH (ref 70–99)
Potassium: 3.8 mEq/L (ref 3.5–5.1)
Sodium: 140 mEq/L (ref 135–145)
Total Bilirubin: 0.4 mg/dL (ref 0.2–1.2)
Total Protein: 7.6 g/dL (ref 6.0–8.3)

## 2015-08-07 LAB — LDL CHOLESTEROL, DIRECT: Direct LDL: 105 mg/dL

## 2015-08-07 LAB — VITAMIN B12: Vitamin B-12: 286 pg/mL (ref 211–911)

## 2015-08-07 LAB — FOLATE: Folate: 10.3 ng/mL (ref >5.9)

## 2015-08-07 LAB — HEMOGLOBIN A1C: Hgb A1c MFr Bld: 5.8 % (ref 4.6–6.5)

## 2015-08-07 LAB — VITAMIN D 25 HYDROXY (VIT D DEFICIENCY, FRACTURES): VITD: 27.18 ng/mL — ABNORMAL LOW (ref 30.00–100.00)

## 2015-08-07 LAB — TSH: TSH: 2.33 u[IU]/mL (ref 0.35–4.50)

## 2015-08-07 MED ORDER — TIZANIDINE HCL 2 MG PO CAPS: 2.0000 mg | ORAL_CAPSULE | Freq: Three times a day (TID) | ORAL | Status: DC

## 2015-08-07 NOTE — Progress Notes (Signed)
Subjective:  Patient ID: [name], female    DOB: 1946/04/05  Age: 70 y.o. MRN: [id number]  CC: Hyperlipidemia and Hypertension   HPI [name] presents for worsening left-sided neck pain over the last year. She describes it as an intermittent burning and throbbing sensation that radiates towards her left shoulder and left arm. She has not noticed any numbness, weakness, or tingling in her arms. She previously tried Norco for pain but during that time she had frequent falls so Norco was discontinued. She has recently been taking Motrin for pain and she requests a muscle relaxer.  Outpatient Prescriptions Prior to Visit  Medication Sig Dispense Refill  . LORazepam (ATIVAN) 1 MG tablet Take 1 tablet (1 mg total) by mouth every 8 (eight) hours as needed for anxiety. 12 tablet 0  . sertraline (ZOLOFT) 25 MG tablet Take 1 tablet (25 mg total) by mouth daily. 30 tablet 0  . ibuprofen (ADVIL,MOTRIN) 200 MG tablet Take 400 mg by mouth every 6 (six) hours as needed for headache, mild pain or moderate pain. Reported on 08/07/2015    . pantoprazole (PROTONIX) 40 MG tablet Take 40 mg by mouth daily. Reported on 08/07/2015    . carvedilol (COREG) 3.125 MG tablet Take 1 tablet (3.125 mg total) by mouth 2 (two) times daily with a meal. (Patient not taking: Reported on 12/27/2014)    . diclofenac sodium (VOLTAREN) 1 % GEL Apply 4 g topically 4 (four) times daily. (Patient not taking: Reported on 12/27/2014) 100 g 11  . HYDROcodone-acetaminophen (NORCO/VICODIN) 5-325 MG per tablet Take one tablet by mouth every 12 hours as needed for moderate pain (Patient not taking: Reported on 12/27/2014)    . hydrOXYzine (ATARAX/VISTARIL) 25 MG tablet Take 1 tablet (25 mg total) by mouth every 8 (eight) hours as needed. (Patient not taking: Reported on 12/27/2014) 10 tablet 0  . Linaclotide (LINZESS) 145 MCG CAPS capsule Take 1 capsule (145 mcg total) by mouth daily. (Patient not taking: Reported on 12/27/2014) 30  capsule 11  . solifenacin (VESICARE) 10 MG tablet Take 1 tablet (10 mg total) by mouth daily. (Patient not taking: Reported on 12/27/2014) 30 tablet 0   No facility-administered medications prior to visit.    ROS Review of Systems  Constitutional: Negative.  Negative for fever, chills, diaphoresis, appetite change and fatigue.  HENT: Negative.   Eyes: Negative.   Respiratory: Negative.  Negative for cough, choking, chest tightness, shortness of breath and stridor.   Cardiovascular: Negative.  Negative for chest pain, palpitations and leg swelling.  Gastrointestinal: Negative.  Negative for nausea, vomiting, abdominal pain, diarrhea, constipation and blood in stool.  Endocrine: Negative.   Genitourinary: Negative.  Negative for difficulty urinating.  Musculoskeletal: Positive for neck pain. Negative for myalgias, back pain, joint swelling, arthralgias, gait problem and neck stiffness.  Skin: Negative.  Negative for color change and rash.  Allergic/Immunologic: Negative.   Neurological: Negative.  Negative for dizziness, tremors, weakness, light-headedness, numbness and headaches.  Hematological: Negative.  Negative for adenopathy. Does not bruise/bleed easily.  Psychiatric/Behavioral: Positive for dysphoric mood. Negative for suicidal ideas, hallucinations, behavioral problems, confusion, sleep disturbance, self-injury, decreased concentration and agitation. The patient is not nervous/anxious and is not hyperactive.     Objective:  BP 146/90 mmHg  Pulse 80  Temp(Src) 98.3 F (36.8 C) (Oral)  Resp 16  Ht 5\' 5"  (1.651 m)  Wt 240 lb (108.863 kg)  BMI 39.94 kg/m2  SpO2 94%  BP Readings from Last 3  Encounters:  08/07/15 146/90  12/27/14 138/96  12/24/14 178/91    Wt Readings from Last 3 Encounters:  08/07/15 240 lb (108.863 kg)  12/24/14 228 lb (103.42 kg)  07/23/14 229 lb 15 oz (104.3 kg)    Physical Exam  Constitutional: She is oriented to person, place, and time.   Non-toxic appearance. She does not have a sickly appearance. She does not appear ill. No distress.  HENT:  Mouth/Throat: Oropharynx is clear and moist. No oropharyngeal exudate.  Eyes: Conjunctivae are normal. Right eye exhibits no discharge. Left eye exhibits no discharge. No scleral icterus.  Neck: Normal range of motion. Neck supple. No JVD present. No tracheal deviation present. No thyromegaly present.  Cardiovascular: Normal rate, regular rhythm, normal heart sounds and intact distal pulses.  Exam reveals no gallop and no friction rub.   No murmur heard. Pulmonary/Chest: Effort normal and breath sounds normal. No stridor. No respiratory distress. She has no wheezes. She has no rales. She exhibits no tenderness.  Abdominal: Bowel sounds are normal. She exhibits no distension and no mass. There is no tenderness. There is no rebound and no guarding.  Musculoskeletal: Normal range of motion. She exhibits no edema or tenderness.       Cervical back: Normal. She exhibits normal range of motion, no tenderness, no bony tenderness, no swelling, no edema, no deformity, no laceration, no pain, no spasm and normal pulse.  Lymphadenopathy:    She has no cervical adenopathy.  Neurological: She is oriented to person, place, and time. She displays no atrophy, no tremor and normal reflexes. No cranial nerve deficit or sensory deficit. She exhibits normal muscle tone. She displays a negative Romberg sign. She displays no seizure activity. Coordination and gait normal.  Reflex Scores:      Tricep reflexes are 1+ on the right side and 1+ on the left side.      Bicep reflexes are 1+ on the right side and 1+ on the left side.      Brachioradialis reflexes are 1+ on the right side and 0 on the left side.      Patellar reflexes are 0 on the right side and 1+ on the left side.      Achilles reflexes are 0 on the right side and 1+ on the left side. Skin: Skin is warm and dry. No rash noted. She is not diaphoretic. No  erythema. No pallor.    Lab Results  Component Value Date   WBC 6.2 08/07/2015   HGB 15.3* 08/07/2015   HCT 45.2 08/07/2015   PLT 278.0 08/07/2015   GLUCOSE 124* 08/07/2015   CHOL 210* 08/07/2015   TRIG 264.0* 08/07/2015   HDL 53.40 08/07/2015   LDLDIRECT 105.0 08/07/2015   LDLCALC 90 05/22/2014   ALT 12 08/07/2015   AST 12 08/07/2015   NA 140 08/07/2015   K 3.8 08/07/2015   CL 101 08/07/2015   CREATININE 0.74 08/07/2015   BUN 20 08/07/2015   CO2 30 08/07/2015   TSH 2.33 08/07/2015   INR 1.33 07/25/2014   HGBA1C 5.8 08/07/2015   Dg Cervical Spine Complete  08/07/2015  CLINICAL DATA:  Left side neck pain, left shoulder pain, cervical radiculitis EXAM: CERVICAL SPINE - COMPLETE 4+ VIEW COMPARISON:  None. FINDINGS: Five views of cervical spine submitted. No acute fracture or subluxation. Mild disc space flattening with anterior spurring at C5-C6 and C6-C7 level. No prevertebral soft tissue swelling. Cervical airway is patent. Bilateral mild narrowing of neural foramina at C5-C6  level. IMPRESSION: No acute fracture or subluxation. Degenerative changes at C5-C6 and C6-C7 level. Electronically Signed   By: Lahoma Crocker M.D.   On: 08/07/2015 15:23   No results found.  Assessment & Plan:   Theary was seen today for hyperlipidemia and hypertension.  Diagnoses and all orders for this visit:  Vitamin B12 deficiency anemia- she is not anemic but her B12 level is trending down, will restart oral B12 replacement therapy. -     CBC with Differential/Platelet; Future -     Folate; Future -     Vitamin B12; Future -     cyanocobalamin 2000 MCG tablet; Take 1 tablet (2,000 mcg total) by mouth daily.  Osteopenia, senile- she is due for DEXA scan and will start vitamin D replacement therapy. -     DG Bone Density; Future  Colon cancer screening -     Ambulatory referral to Gastroenterology  Essential hypertension- her blood pressure is well-controlled, lites and renal function are stable. -      Comprehensive metabolic panel; Future  Hyperglycemia -     Comprehensive metabolic panel; Future -     Hemoglobin A1c; Future  Hyperlipidemia with target LDL less than 100 -     Lipid panel; Future -     Comprehensive metabolic panel; Future -     TSH; Future  Vitamin D deficiency- her vitamin D level is low, I have asked her to start replacement therapy with 2000 international units daily. -     DG Bone Density; Future -     VITAMIN D 25 Hydroxy (Vit-D Deficiency, Fractures); Future -     Cholecalciferol 2000 units TABS; Take 1 tablet (2,000 Units total) by mouth daily.  Cervical radiculitis- she appears to have spinal stenosis with left radicular symptoms and an abnormal reflex on the left side, I have asked her to see pain management for further evaluation and treatment. For now she will continue Motrin and will also add Tiazac tizanidine for additional symptom relief. -     DG Cervical Spine Complete; Future -     tizanidine (ZANAFLEX) 2 MG capsule; Take 1 capsule (2 mg total) by mouth 3 (three) times daily. -     Ambulatory referral to Pain Clinic   I have discontinued Ms. Arnette's Linaclotide, solifenacin, diclofenac sodium, HYDROcodone-acetaminophen, carvedilol, and hydrOXYzine. I am also having her start on tizanidine, Cholecalciferol, and cyanocobalamin. Additionally, I am having her maintain her pantoprazole, ibuprofen, LORazepam, sertraline, and traZODone.  Meds ordered this encounter  Medications  . sertraline (ZOLOFT) 50 MG tablet    Sig: Take 50 mg by mouth daily.    Refill:  4  . traZODone (DESYREL) 50 MG tablet    Sig: Reported on 08/07/2015    Refill:  3  . tizanidine (ZANAFLEX) 2 MG capsule    Sig: Take 1 capsule (2 mg total) by mouth 3 (three) times daily.    Dispense:  90 capsule    Refill:  3  . Cholecalciferol 2000 units TABS    Sig: Take 1 tablet (2,000 Units total) by mouth daily.    Dispense:  90 tablet    Refill:  3  . cyanocobalamin 2000 MCG  tablet    Sig: Take 1 tablet (2,000 mcg total) by mouth daily.    Dispense:  90 tablet    Refill:  3     Follow-up: Return in about 6 weeks (around 09/18/2015).  [name], MD

## 2015-08-07 NOTE — Patient Instructions (Signed)
Acute Torticollis °Torticollis is a condition in which the muscles of the neck tighten (contract) abnormally, causing the neck to twist and the head to move into an unnatural position. Torticollis that develops suddenly is called acute torticollis. If torticollis becomes chronic and is left untreated, the face and neck can become deformed. °CAUSES °This condition may be caused by: °· Sleeping in an awkward position (common). °· Extending or twisting the neck muscles beyond their normal position. °· Infection. °In some cases, the cause may not be known. °SYMPTOMS °Symptoms of this condition include: °· An unnatural position of the head. °· Neck pain. °· A limited ability to move the neck. °· Twisting of the neck to one side. °DIAGNOSIS °This condition is diagnosed with a physical exam. You may also have imaging tests, such as an X-ray, CT scan, or MRI. °TREATMENT °Treatment for this condition involves trying to relax the neck muscles. It may include: °· Medicines or shots. °· Physical therapy. °· Surgery. This may be done in severe cases. °HOME CARE INSTRUCTIONS °· Take medicines only as directed by your health care provider. °· Do stretching exercises and massage your neck as directed by your health care provider. °· Keep all follow-up visits as directed by your health care provider. This is important. °SEEK MEDICAL CARE IF: °· You develop a fever. °SEEK IMMEDIATE MEDICAL CARE IF: °· You develop difficulty breathing. °· You develop noisy breathing (stridor). °· You start drooling. °· You have trouble swallowing or have pain with swallowing. °· You develop numbness or weakness in your hands or feet. °· You have changes in your speech, understanding, or vision. °· Your pain gets worse. °  °This information is not intended to replace advice given to you by your health care provider. Make sure you discuss any questions you have with your health care provider. °  °Document Released: 07/16/2000 Document Revised:  12/03/2014 Document Reviewed: 07/15/2014 °Elsevier Interactive Patient Education ©2016 [employer] ° °

## 2015-08-07 NOTE — Telephone Encounter (Signed)
Pt was in today and she states she needs something for pain with the muscle relaxer. She says zoloft was sent in before and she doesn't need that.

## 2015-08-07 NOTE — Progress Notes (Signed)
Pre visit review using our clinic review tool, if applicable. No additional management support is needed unless otherwise documented below in the visit note. 

## 2015-08-08 NOTE — Telephone Encounter (Signed)
Please advise. Spoke with pt and informed you were out of office. She states she needs something to go along with muscle relaxant

## 2015-08-09 MED ORDER — CYANOCOBALAMIN 2000 MCG PO TABS: 2000.0000 ug | ORAL_TABLET | Freq: Every day | ORAL | Status: DC

## 2015-08-09 MED ORDER — CHOLECALCIFEROL 50 MCG (2000 UT) PO TABS
1.0000 | ORAL_TABLET | Freq: Every day | ORAL | Status: DC
Start: 2015-08-09 — End: 2017-11-28

## 2015-08-09 NOTE — Telephone Encounter (Signed)
She was referred to pain management for this

## 2015-08-11 NOTE — Telephone Encounter (Signed)
Pt made aware

## 2015-08-13 ENCOUNTER — Other Ambulatory Visit: Payer: Self-pay | Admitting: Internal Medicine

## 2015-08-13 DIAGNOSIS — M5412 Radiculopathy, cervical region: Secondary | ICD-10-CM

## 2015-08-13 MED ORDER — BUPRENORPHINE 7.5 MCG/HR TD PTWK: 1.0000 | MEDICATED_PATCH | TRANSDERMAL | Status: DC

## 2015-08-13 NOTE — Telephone Encounter (Signed)
Rx written Please fax to her pharmacy

## 2015-08-13 NOTE — Telephone Encounter (Signed)
Are you ok this with request?

## 2015-08-13 NOTE — Telephone Encounter (Signed)
faxed

## 2015-08-13 NOTE — Telephone Encounter (Signed)
Patient has called back.  She is requesting a script for pain to get her through until she can get in with pain management.

## 2015-10-15 ENCOUNTER — Encounter: Payer: Self-pay | Admitting: Physical Medicine & Rehabilitation

## 2015-10-16 ENCOUNTER — Encounter: Payer: Medicare Other | Admitting: Physical Medicine & Rehabilitation

## 2015-10-21 ENCOUNTER — Other Ambulatory Visit: Payer: Self-pay | Admitting: Neurosurgery

## 2015-10-21 DIAGNOSIS — G919 Hydrocephalus, unspecified: Secondary | ICD-10-CM

## 2015-10-27 ENCOUNTER — Telehealth: Payer: Self-pay | Admitting: Internal Medicine

## 2015-10-27 NOTE — Telephone Encounter (Signed)
Patient Name: [name] DOB: [birth year] Initial Comment Caller states, wanted an appt to have labs tests, she seems more confused than normal and shaky, stomach problems w/ nausea, taking medications incorrectly. Nurse Assessment Nurse: Ronnald Ramp, RN, Miranda Date/Time (Eastern Time): 10/27/2015 1:01:25 PM Confirm and document reason for call. If symptomatic, describe symptoms. You must click the next button to save text entered. ---Caller states she is the patient's therapist. She states the patient has been shaky and anxious. She seems more "slow" and forgetful than normal. No confusion. She has not had a physical in a year or more. Current meds provided by psychiatrist Zoloft, Ativan, Trazadone. She also feels that the doctors need to coordinate with each other to watch for interactions. Also at the end of the call, the caller states the pt has been vomiting several times a day for the 3 weeks. Has the patient traveled out of the country within the last 30 days? ---Not Applicable Does the patient have any new or worsening symptoms? ---Yes Will a triage be completed? ---Yes Related visit to physician within the last 2 weeks? ---No Does the PT have any chronic conditions? (i.e. diabetes, asthma, etc.) ---Yes List chronic conditions. ---Anxiety, Depression Is this a behavioral health or substance abuse call? ---Yes Are you having any thoughts or feelings of harming or killing yourself or someone else? ---No Are you currently experiencing any physical discomfort that you think may be related to the use of alcohol or other drugs? (use substance abuse or alcohol abuse guidelines. These include withdrawal symptoms) ---No Do you worry that you may be hearing or seeing things that others do not? ---No Do you take medications for your condition(s)? ---Yes List medications here. ---Zoloft, Ativan, Trazadone Guidelines Guideline Title Affirmed Question Affirmed Notes Anxiety and  Panic Attack Symptoms interfere with sleep or daily activities Vomiting Vomiting is a chronic symptom (recurrent or ongoing AND present > 4 weeks) PLEASE NOTE: All timestamps contained within this report are represented as [place] Standard Time. CONFIDENTIALTY NOTICE: This fax transmission is intended only for the addressee. It contains information that is legally privileged, confidential or otherwise protected from use or disclosure. If you are not the intended recipient, you are strictly prohibited from reviewing, disclosing, copying using or disseminating any of this information or taking any action in reliance on or regarding this information. If you have received this fax in error, please notify us immediately by telephone so that we can arrange for its return to [place]. Phone: [phone], Toll-Free: [phone], Fax: [phone] Page: 2 of 2 Call Id: [id number] Final Disposition User See PCP When Office is Open (within 3 days) Ronnald Ramp, RN, Miranda Comments No appropriate appt times available with PCP. Attempted to reach the office to verify that it was ok to schedule 2 acute appts back to back for the patient to be seen, but held > 5 min. Scheduled 30 min slot with Lorane Gell on 10/29/15 at 2pm. Referrals REFERRED TO PCP OFFICE Disagree/Comply: Comply Disagree/Comply: Comply

## 2015-10-29 ENCOUNTER — Ambulatory Visit (INDEPENDENT_AMBULATORY_CARE_PROVIDER_SITE_OTHER): Payer: Medicare Other | Admitting: Nurse Practitioner

## 2015-10-29 ENCOUNTER — Other Ambulatory Visit (INDEPENDENT_AMBULATORY_CARE_PROVIDER_SITE_OTHER): Payer: Medicare Other

## 2015-10-29 VITALS — BP 100/78 | HR 108 | Temp 97.9°F | Ht 65.0 in | Wt 223.0 lb

## 2015-10-29 DIAGNOSIS — F5089 Other specified eating disorder: Secondary | ICD-10-CM

## 2015-10-29 DIAGNOSIS — R410 Disorientation, unspecified: Secondary | ICD-10-CM | POA: Diagnosis not present

## 2015-10-29 DIAGNOSIS — E639 Nutritional deficiency, unspecified: Secondary | ICD-10-CM | POA: Diagnosis not present

## 2015-10-29 LAB — COMPREHENSIVE METABOLIC PANEL
ALT: 57 U/L — ABNORMAL HIGH (ref 0–35)
AST: 96 U/L — ABNORMAL HIGH (ref 0–37)
Albumin: 3.9 g/dL (ref 3.5–5.2)
Alkaline Phosphatase: 101 U/L (ref 39–117)
BUN: 13 mg/dL (ref 6–23)
CO2: 29 mEq/L (ref 19–32)
Calcium: 10.1 mg/dL (ref 8.4–10.5)
Chloride: 100 mEq/L (ref 96–112)
Creatinine, Ser: 0.88 mg/dL (ref 0.40–1.20)
GFR: 67.77 mL/min (ref >60.00)
Glucose, Bld: 185 mg/dL — ABNORMAL HIGH (ref 70–99)
Potassium: 4.7 mEq/L (ref 3.5–5.1)
Sodium: 137 mEq/L (ref 135–145)
Total Bilirubin: 1.4 mg/dL — ABNORMAL HIGH (ref 0.2–1.2)
Total Protein: 7.8 g/dL (ref 6.0–8.3)

## 2015-10-29 LAB — CBC WITH DIFFERENTIAL/PLATELET
Basophils Absolute: 0 10*3/uL (ref 0.0–0.1)
Basophils Relative: 0.1 % (ref 0.0–3.0)
Eosinophils Absolute: 0.2 10*3/uL (ref 0.0–0.7)
Eosinophils Relative: 2.6 % (ref 0.0–5.0)
HCT: 44.8 % (ref 36.0–46.0)
Hemoglobin: 15.3 g/dL — ABNORMAL HIGH (ref 12.0–15.0)
Lymphocytes Relative: 13.9 % (ref 12.0–46.0)
Lymphs Abs: 1.1 10*3/uL (ref 0.7–4.0)
MCHC: 34.2 g/dL (ref 30.0–36.0)
MCV: 89.9 fl (ref 78.0–100.0)
Monocytes Absolute: 0.5 10*3/uL (ref 0.1–1.0)
Monocytes Relative: 6 % (ref 3.0–12.0)
Neutro Abs: 6.1 10*3/uL (ref 1.4–7.7)
Neutrophils Relative %: 77.4 % — ABNORMAL HIGH (ref 43.0–77.0)
Platelets: 193 10*3/uL (ref 150.0–400.0)
RBC: 4.98 Mil/uL (ref 3.87–5.11)
RDW: 13.5 % (ref 11.5–15.5)
WBC: 7.8 10*3/uL (ref 4.0–10.5)

## 2015-10-29 LAB — LIPASE: Lipase: 23 U/L (ref 11.0–59.0)

## 2015-10-29 MED ORDER — PANTOPRAZOLE SODIUM 40 MG PO TBEC: 40.0000 mg | DELAYED_RELEASE_TABLET | Freq: Every day | ORAL | Status: DC

## 2015-10-29 NOTE — Progress Notes (Signed)
Pre visit review using our clinic review tool, if applicable. No additional management support is needed unless otherwise documented below in the visit note. 

## 2015-10-29 NOTE — Patient Instructions (Addendum)
Please get your labs downstairs.   We will speak with Dr. Ronnald Ramp to get home health/home aides involved.   Pick up your pantoprazole (protonix) for your stomach

## 2015-10-29 NOTE — Progress Notes (Signed)
Patient ID: [name], female    DOB: July 05, 1946  Age: 70 y.o. MRN: [id number]  CC: OTHER   HPI [name] presents for [facility] call about several issues. Her therapist is with her today.   1) Pt's psychologist called and reports she is shaky and anxious Slower and more forgetful recently. She does discuss some of these today, but is most concerned with her "vomiting".   Patient reports: Vomiting "several times a day" for 3 weeks After further discussion she admits it is once or twice every few days and it is not a lot of output, mostly liquid. She denies blood   Patient's therapist [name]) reports poor eating habits and makes her eat a spoonful of peanut butter to have something on her stomach.   Patient also reports generalized headaches and nausea- not in relation to vomiting (?- patient is a poor historian)   Pt reports eating and drinking:  Water and orange juice  Eats sandwiches   Confusion in the past, anxious, fearful, depression   Was taking meds wrong in the past and this was helped by having friends help with her medications, but now they are getting burnt out.   Pt lives in an apartment and does have outside help some days. Her therapist reports that she found kitchen dishcloths in her oven one day and finds strangely placed items occasionally.   History [name] has a past medical history of Depression; Anxiety; Hypertension; MVP (mitral valve prolapse); Hydrocephalus; Diabetes mellitus without complication ([place]); Sinus bradycardia; Dizziness; Falls; Acute kidney injury ([place]); Transaminitis; Hypotension; and Acid reflux disease.   She has past surgical history that includes Cholecystectomy; Brain surgery; ERCP (N/A, 02/28/2014); Esophagogastroduodenoscopy (N/A, 02/28/2014); and Ventriculoperitoneal shunt.   Her family history includes Alcohol abuse in her father; Diabetes in her father; Heart disease in her mother; Hypertension in her mother; Stroke in her mother.  There is no history of Cancer.She reports that she has never smoked. She has never used smokeless tobacco. She reports that she does not drink alcohol or use illicit drugs.  Outpatient Prescriptions Prior to Visit  Medication Sig Dispense Refill  . Buprenorphine (BUTRANS) 7.5 MCG/HR PTWK Place 1 patch onto the skin once a week. 4 patch 5  . ibuprofen (ADVIL,MOTRIN) 200 MG tablet Take 400 mg by mouth every 6 (six) hours as needed for headache, mild pain or moderate pain. Reported on 08/07/2015    . LORazepam (ATIVAN) 1 MG tablet Take 1 tablet (1 mg total) by mouth every 8 (eight) hours as needed for anxiety. 12 tablet 0  . sertraline (ZOLOFT) 50 MG tablet Take 50 mg by mouth daily.  4  . traZODone (DESYREL) 50 MG tablet Reported on 08/07/2015  3  . Cholecalciferol 2000 units TABS Take 1 tablet (2,000 Units total) by mouth daily. (Patient not taking: Reported on 10/29/2015) 90 tablet 3  . cyanocobalamin 2000 MCG tablet Take 1 tablet (2,000 mcg total) by mouth daily. (Patient not taking: Reported on 10/29/2015) 90 tablet 3  . tizanidine (ZANAFLEX) 2 MG capsule Take 1 capsule (2 mg total) by mouth 3 (three) times daily. 90 capsule 3  . pantoprazole (PROTONIX) 40 MG tablet Take 40 mg by mouth daily. Reported on 08/07/2015     No facility-administered medications prior to visit.    ROS Review of Systems  Constitutional: Negative for fever, chills, diaphoresis and fatigue.  Respiratory: Negative for chest tightness, shortness of breath and wheezing.   Cardiovascular: Negative for chest pain, palpitations and leg  swelling.  Gastrointestinal: Positive for nausea and vomiting. Negative for diarrhea.  Skin: Negative for rash.  Neurological: Negative for dizziness, weakness, numbness and headaches.  Psychiatric/Behavioral: Positive for confusion and agitation. Negative for suicidal ideas, hallucinations, behavioral problems, sleep disturbance, self-injury, dysphoric mood and decreased concentration. The patient  is nervous/anxious. The patient is not hyperactive.     Objective:  BP 100/78 mmHg  Pulse 108  Temp(Src) 97.9 F (36.6 C) (Oral)  Ht '5\' 5"'$  (1.651 m)  Wt 223 lb (101.152 kg)  BMI 37.11 kg/m2  SpO2 98%  Physical Exam  Constitutional: She is oriented to person, place, and time. She appears well-developed and well-nourished. No distress.  HENT:  Head: Normocephalic and atraumatic.  Right Ear: External ear normal.  Left Ear: External ear normal.  Cardiovascular: Regular rhythm.   Slightly tachycardic due to anxiety  Pulmonary/Chest: Effort normal and breath sounds normal. No respiratory distress. She has no wheezes. She has no rales. She exhibits no tenderness.  Neurological: She is alert and oriented to person, place, and time. Coordination abnormal.  Uses cane for ambulation  Skin: Skin is warm and dry. No rash noted. She is not diaphoretic.  Psychiatric:  Pt is anxious in office today and works her self up more by talking about her panic attacks in grocery stores. She has difficulty with logical thought processes.     Assessment & Plan:   [name] was seen today for other and other.  Diagnoses and all orders for this visit:  Psychogenic vomiting with nausea -     Comp Met (CMET); Future -     Lipase; Future -     CBC with Differential/Platelet; Future -     Ambulatory referral to Home Health  Nutritional deficiency -     Ambulatory referral to [place] confusion -     Ambulatory referral to [place]  Other orders -     pantoprazole (PROTONIX) 40 MG tablet; Take 1 tablet (40 mg total) by mouth daily. Reported on 08/07/2015   I have changed [name]'s pantoprazole. I am also having her maintain her ibuprofen, LORazepam, sertraline, traZODone, tizanidine, Cholecalciferol, cyanocobalamin, and Buprenorphine.  Meds ordered this encounter  Medications  . pantoprazole (PROTONIX) 40 MG tablet    Sig: Take 1 tablet (40 mg total) by mouth daily. Reported on  08/07/2015    Dispense:  90 tablet    Refill:  0    Order Specific Question:  Supervising Provider    Answer:  Crecencio Mc [[id number]]     Follow-up: Return if symptoms worsen or fail to improve.

## 2015-10-30 ENCOUNTER — Encounter: Payer: Medicare Other | Admitting: Physical Medicine & Rehabilitation

## 2015-11-02 ENCOUNTER — Encounter: Payer: Self-pay | Admitting: Nurse Practitioner

## 2015-11-02 DIAGNOSIS — R410 Disorientation, unspecified: Secondary | ICD-10-CM | POA: Insufficient documentation

## 2015-11-02 DIAGNOSIS — E639 Nutritional deficiency, unspecified: Secondary | ICD-10-CM | POA: Insufficient documentation

## 2015-11-02 DIAGNOSIS — F5089 Other specified eating disorder: Secondary | ICD-10-CM | POA: Insufficient documentation

## 2015-11-02 NOTE — Assessment & Plan Note (Signed)
HH referral for poor/inconsistent eating habits and mental confusion

## 2015-11-02 NOTE — Assessment & Plan Note (Signed)
Last time pt's therapist reports this happened was with incorrect medication compliance. Her friends are non-medical and helping her until we can have nursing help on board. HH order placed.

## 2015-11-02 NOTE — Assessment & Plan Note (Signed)
Checking CBC w/ diff, CMET and Lipase today  Sent in refill of protonix and advised pt and psychologist about the medication and what it is for.  Will place [facility] referral for this patient and route note to PCP for further follow up.   I have spent 26 minutes face to face with greater than 50% of time spent discussing history, treatment options, counseling, and home health discussion.

## 2015-11-23 ENCOUNTER — Other Ambulatory Visit: Payer: Self-pay | Admitting: Internal Medicine

## 2016-01-12 ENCOUNTER — Other Ambulatory Visit: Payer: Self-pay | Admitting: Nurse Practitioner

## 2016-01-12 DIAGNOSIS — Z760 Encounter for issue of repeat prescription: Secondary | ICD-10-CM

## 2016-01-21 ENCOUNTER — Encounter: Payer: Self-pay | Admitting: Internal Medicine

## 2016-01-21 ENCOUNTER — Ambulatory Visit (INDEPENDENT_AMBULATORY_CARE_PROVIDER_SITE_OTHER): Payer: Medicare Other | Admitting: Internal Medicine

## 2016-01-21 VITALS — BP 106/74 | HR 70 | Temp 98.1°F | Resp 16 | Ht 65.0 in | Wt 228.0 lb

## 2016-01-21 DIAGNOSIS — E118 Type 2 diabetes mellitus with unspecified complications: Secondary | ICD-10-CM | POA: Diagnosis not present

## 2016-01-21 DIAGNOSIS — E669 Obesity, unspecified: Secondary | ICD-10-CM

## 2016-01-21 DIAGNOSIS — I519 Heart disease, unspecified: Secondary | ICD-10-CM | POA: Diagnosis not present

## 2016-01-21 DIAGNOSIS — R0683 Snoring: Secondary | ICD-10-CM | POA: Diagnosis not present

## 2016-01-21 DIAGNOSIS — I10 Essential (primary) hypertension: Secondary | ICD-10-CM

## 2016-01-21 DIAGNOSIS — I5189 Other ill-defined heart diseases: Secondary | ICD-10-CM

## 2016-01-21 LAB — HEMOGLOBIN A1C: Hemoglobin A1C: 7.9

## 2016-01-21 MED ORDER — SITAGLIPTIN PHOSPHATE 100 MG PO TABS: 100.0000 mg | ORAL_TABLET | Freq: Every day | ORAL | Status: DC

## 2016-01-21 MED ORDER — CANAGLIFLOZIN 100 MG PO TABS: 100.0000 mg | ORAL_TABLET | Freq: Every day | ORAL | Status: DC

## 2016-01-21 NOTE — Progress Notes (Signed)
Pre visit review using our clinic review tool, if applicable. No additional management support is needed unless otherwise documented below in the visit note. 

## 2016-01-21 NOTE — Patient Instructions (Signed)
Fatigue  Fatigue is feeling tired all of the time, a lack of energy, or a lack of motivation. Occasional or mild fatigue is often a normal response to activity or life in general. However, long-lasting (chronic) or extreme fatigue may indicate an underlying medical condition.  HOME CARE INSTRUCTIONS   Watch your fatigue for any changes. The following actions may help to lessen any discomfort you are feeling:  · Talk to your health care provider about how much sleep you need each night. Try to get the required amount every night.  · Take medicines only as directed by your health care provider.  · Eat a healthy and nutritious diet. Ask your health care provider if you need help changing your diet.  · Drink enough fluid to keep your urine clear or pale yellow.  · Practice ways of relaxing, such as yoga, meditation, massage therapy, or acupuncture.  · Exercise regularly.    · Change situations that cause you stress. Try to keep your work and personal routine reasonable.  · Do not abuse illegal drugs.  · Limit alcohol intake to no more than 1 drink per day for nonpregnant women and 2 drinks per day for men. One drink equals 12 ounces of beer, 5 ounces of wine, or 1½ ounces of hard liquor.  · Take a multivitamin, if directed by your health care provider.  SEEK MEDICAL CARE IF:   · Your fatigue does not get better.  · You have a fever.    · You have unintentional weight loss or gain.  · You have headaches.    · You have difficulty:      Falling asleep.    Sleeping throughout the night.  · You feel angry, guilty, anxious, or sad.     · You are unable to have a bowel movement (constipation).    · You skin is dry.     · Your legs or another part of your body is swollen.    SEEK IMMEDIATE MEDICAL CARE IF:   · You feel confused.    · Your vision is blurry.  · You feel faint or pass out.    · You have a severe headache.    · You have severe abdominal, pelvic, or back pain.    · You have chest pain, shortness of breath, or an  irregular or fast heartbeat.    · You are unable to urinate or you urinate less than normal.    · You develop abnormal bleeding, such as bleeding from the rectum, vagina, nose, lungs, or nipples.  · You vomit blood.     · You have thoughts about harming yourself or committing suicide.    · You are worried that you might harm someone else.       This information is not intended to replace advice given to you by your health care provider. Make sure you discuss any questions you have with your health care provider.     Document Released: 05/16/2007 Document Revised: 08/09/2014 Document Reviewed: 11/20/2013  Elsevier Interactive Patient Education ©2016 [employer] Inc[employer]

## 2016-01-21 NOTE — Progress Notes (Signed)
Subjective:  Patient ID: [name], female    DOB: [birth year]  Age: 70 y.o. MRN: [id number]  CC: Hypertension and Diabetes   HPI [name] presents for follow-up on hypertension and diabetes, she complains of chronic fatigue and heavy snoring. She naps 2-3 times throughout the day.  Outpatient Prescriptions Prior to Visit  Medication Sig Dispense Refill  . Buprenorphine (BUTRANS) 7.5 MCG/HR PTWK Place 1 patch onto the skin once a week. 4 patch 5  . ibuprofen (ADVIL,MOTRIN) 200 MG tablet Take 400 mg by mouth every 6 (six) hours as needed for headache, mild pain or moderate pain. Reported on 08/07/2015    . LORazepam (ATIVAN) 1 MG tablet Take 1 tablet (1 mg total) by mouth every 8 (eight) hours as needed for anxiety. 12 tablet 0  . pantoprazole (PROTONIX) 40 MG tablet TAKE 1 TABLET (40 MG TOTAL) BY MOUTH DAILY. REPORTED ON 08/07/2015 90 tablet 3  . sertraline (ZOLOFT) 50 MG tablet Take 100 mg by mouth daily.   4  . tizanidine (ZANAFLEX) 2 MG capsule TAKE ONE CAPSULE BY MOUTH 3 TIMES A DAY 90 capsule 3  . traZODone (DESYREL) 50 MG tablet Reported on 08/07/2015  3  . Cholecalciferol 2000 units TABS Take 1 tablet (2,000 Units total) by mouth daily. (Patient not taking: Reported on 10/29/2015) 90 tablet 3  . cyanocobalamin 2000 MCG tablet Take 1 tablet (2,000 mcg total) by mouth daily. (Patient not taking: Reported on 10/29/2015) 90 tablet 3   No facility-administered medications prior to visit.    ROS Review of Systems  Constitutional: Positive for fatigue. Negative for fever, chills, diaphoresis, appetite change and unexpected weight change.  HENT: Negative.  Negative for trouble swallowing and voice change.   Eyes: Negative.   Respiratory: Positive for apnea and shortness of breath (mild DOE). Negative for cough, choking, chest tightness, wheezing and stridor.        She thinks she may have apnea though no one has witnessed her sleeping in the past few years  Cardiovascular:  Positive for palpitations. Negative for chest pain and leg swelling.  Gastrointestinal: Negative.  Negative for nausea, vomiting, abdominal pain and diarrhea.  Endocrine: Negative.  Negative for polydipsia, polyphagia and polyuria.  Genitourinary: Negative.   Musculoskeletal: Negative.  Negative for back pain, neck pain and neck stiffness.  Skin: Negative.   Allergic/Immunologic: Negative.   Neurological: Negative.  Negative for dizziness, tremors, syncope, weakness, light-headedness, numbness and headaches.  Hematological: Negative.  Does not bruise/bleed easily.  Psychiatric/Behavioral: Negative.     Objective:  BP 106/74 mmHg  Pulse 70  Temp(Src) 98.1 F (36.7 C) (Oral)  Resp 16  Ht 5\' 5"  (1.651 m)  Wt 228 lb (103.42 kg)  BMI 37.94 kg/m2  SpO2 97%  BP Readings from Last 3 Encounters:  01/21/16 106/74  10/29/15 100/78  08/07/15 146/90    Wt Readings from Last 3 Encounters:  01/21/16 228 lb (103.42 kg)  10/29/15 223 lb (101.152 kg)  08/07/15 240 lb (108.863 kg)    Physical Exam  Constitutional: She is oriented to person, place, and time. No distress.  HENT:  Mouth/Throat: Oropharynx is clear and moist. No oropharyngeal exudate.  Eyes: Conjunctivae are normal. Right eye exhibits no discharge. Left eye exhibits no discharge. No scleral icterus.  Neck: Normal range of motion. Neck supple. No JVD present. No tracheal deviation present. No thyromegaly present.  Cardiovascular: Normal rate, regular rhythm, normal heart sounds and intact distal pulses.  Exam reveals no gallop  and no friction rub.   No murmur heard. Pulmonary/Chest: Effort normal and breath sounds normal. No stridor. No respiratory distress. She has no wheezes. She has no rales. She exhibits no tenderness.  Abdominal: Soft. Bowel sounds are normal. She exhibits no distension and no mass. There is no tenderness. There is no rebound and no guarding.  Musculoskeletal: Normal range of motion. She exhibits no edema  or tenderness.  Lymphadenopathy:    She has no cervical adenopathy.  Neurological: She is oriented to person, place, and time.  Skin: Skin is warm and dry. No rash noted. She is not diaphoretic. No erythema. No pallor.  Vitals reviewed.   Lab Results  Component Value Date   WBC 7.8 10/29/2015   HGB 15.3* 10/29/2015   HCT 44.8 10/29/2015   PLT 193.0 10/29/2015   GLUCOSE 185* 10/29/2015   CHOL 210* 08/07/2015   TRIG 264.0* 08/07/2015   HDL 53.40 08/07/2015   LDLDIRECT 105.0 08/07/2015   LDLCALC 90 05/22/2014   ALT 57* 10/29/2015   AST 96* 10/29/2015   NA 137 10/29/2015   K 4.7 10/29/2015   CL 100 10/29/2015   CREATININE 0.88 10/29/2015   BUN 13 10/29/2015   CO2 29 10/29/2015   TSH 2.33 08/07/2015   INR 1.33 07/25/2014   HGBA1C 5.8 08/07/2015    Dg Cervical Spine Complete  08/07/2015  CLINICAL DATA:  Left side neck pain, left shoulder pain, cervical radiculitis EXAM: CERVICAL SPINE - COMPLETE 4+ VIEW COMPARISON:  None. FINDINGS: Five views of cervical spine submitted. No acute fracture or subluxation. Mild disc space flattening with anterior spurring at C5-C6 and C6-C7 level. No prevertebral soft tissue swelling. Cervical airway is patent. Bilateral mild narrowing of neural foramina at C5-C6 level. IMPRESSION: No acute fracture or subluxation. Degenerative changes at C5-C6 and C6-C7 level. Electronically Signed   By: Lahoma Crocker M.D.   On: 08/07/2015 15:23    Assessment & Plan:   Samridhi was seen today for hypertension and diabetes.  Diagnoses and all orders for this visit:  Type 2 diabetes mellitus with complication, without long-term current use of insulin ([place])- her A1c is up to 7.9%, she does not tolerate metformin due to diarrhea, will start treating her hyperglycemia with an SGLT2 inhibitor and a DPP4 inhibitor. -     canagliflozin (INVOKANA) 100 MG TABS tablet; Take 1 tablet (100 mg total) by mouth daily before breakfast. -     sitaGLIPtin (JANUVIA) 100 MG tablet; Take 1  tablet (100 mg total) by mouth daily.  Left ventricular diastolic dysfunction, NYHA class 2- she has a normal volume status and her blood pressure is well controlled so I don't think her symptoms are related to heart failure  Snoring- I think she should have a sleep study -     Ambulatory referral to Pulmonology  Essential hypertension- her blood pressures adequately well-controlled.  Obesity (BMI 30-39.9)- she agrees to work on her lifestyle modifications including decreased caloric intake and exercise to lose weight.   I am having Ms. Starnes start on canagliflozin and sitaGLIPtin. I am also having her maintain her ibuprofen, LORazepam, sertraline, traZODone, Cholecalciferol, cyanocobalamin, Buprenorphine, tizanidine, and pantoprazole.  Meds ordered this encounter  Medications  . canagliflozin (INVOKANA) 100 MG TABS tablet    Sig: Take 1 tablet (100 mg total) by mouth daily before breakfast.    Dispense:  90 tablet    Refill:  1  . sitaGLIPtin (JANUVIA) 100 MG tablet    Sig: Take 1 tablet (100  mg total) by mouth daily.    Dispense:  90 tablet    Refill:  1     Follow-up: Return in about 3 months (around 04/22/2016).  [name], MD

## 2016-02-11 ENCOUNTER — Telehealth: Payer: Self-pay

## 2016-02-11 ENCOUNTER — Telehealth: Payer: Self-pay | Admitting: Internal Medicine

## 2016-02-11 NOTE — Telephone Encounter (Signed)
[place] [facility] Patient Name: [name] DOB: 03-03-1946 Initial Comment Caller states she was given some sample [place], and she started experiencing some nausea for the past few days. Concerned it may be reaction to the medications. Hx of pancreatitis. Nurse Assessment Nurse: Martyn Ehrich RN, Felicia Date/Time (Eastern Time): 02/11/2016 4:00:48 PM Confirm and document reason for call. If symptomatic, describe symptoms. You must click the next button to save text entered. ---PT is on [place] for 3 weeks. Nausea since Friday. IT says in literature there are concerns with the medication if you have previous pancreatitis 3x. The medications are ordered by MB above. No vomiting. Nausea is not bad. She has no way to check her blood sugar. Last time it was checked was at her appointment - he said it was a little high. She doesnt have a blood sugar kit. No fever today She had 101.1 Sat andSun. NO FEVER Today Has the patient traveled out of the country within the last 30 days? ---No Does the patient have any new or worsening symptoms? ---Yes Will a triage be completed? ---Yes Related visit to physician within the last 2 weeks? ---No Does the PT have any chronic conditions? (i.e. diabetes, asthma, etc.) ---Yes List chronic conditions. ---DM, depression and anxiety Is this a behavioral health or substance abuse call? ---No Guidelines Guideline Title Affirmed Question Affirmed Notes Traumatic Brain Injury More than 14 Days Ago Follow-up Call [1] TBI symptoms are worsening AND [2]age > 60 years Final Disposition User Go to ED Now (or PCP triage) Martyn Ehrich, RN, Felicia Comments She has a VP Shunt She declined to go to ER Told caller office will be notified that she did not want to go to ER and someone will call her back Co[place]ran MA in the office was reached and she will pass this  triage outcome and symptoms to Dr. [name] Referrals GO TO FACILITY REFUSED Disagree/Comply: Disagree Call Id: [id number] Disagree/Comply Reason: Disagree with instructions

## 2016-02-11 NOTE — Telephone Encounter (Signed)
Please advise as Dr. Jenny Reichmann is out of the office, [facility] called and stated that patient does not want to go to the ER. 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

## 2016-02-11 NOTE — Telephone Encounter (Signed)
Pls sch OV w/any provider. Go to ER if worse Thx

## 2016-02-12 NOTE — Telephone Encounter (Signed)
Left message for patient to give us a call back.  

## 2016-02-24 ENCOUNTER — Encounter: Payer: Self-pay | Admitting: Internal Medicine

## 2016-02-24 ENCOUNTER — Ambulatory Visit (INDEPENDENT_AMBULATORY_CARE_PROVIDER_SITE_OTHER): Payer: Medicare Other | Admitting: Internal Medicine

## 2016-02-24 VITALS — BP 118/78 | HR 96 | Temp 98.2°F | Resp 16 | Ht 65.0 in | Wt 229.5 lb

## 2016-02-24 DIAGNOSIS — I10 Essential (primary) hypertension: Secondary | ICD-10-CM

## 2016-02-24 DIAGNOSIS — E118 Type 2 diabetes mellitus with unspecified complications: Secondary | ICD-10-CM | POA: Diagnosis not present

## 2016-02-24 NOTE — Patient Instructions (Signed)

## 2016-02-24 NOTE — Progress Notes (Signed)
Pre visit review using our clinic review tool, if applicable. No additional management support is needed unless otherwise documented below in the visit note. 

## 2016-02-25 NOTE — Progress Notes (Signed)
Subjective:  Patient ID: [name], female    DOB: Jun 28, 1946  Age: 70 y.o. MRN: [id number]  CC: Diabetes   HPI CURLEY GRGAS presents for follow-up on diabetes. When I last saw her, her A1c was up to 7.9%. I've asked her to start Invokana and Januvia. She was given samples, she took the samples and had no side effects or complications. After she got the prescription filled from the pharmacy she was concerned because she was warned that these medications can cause pancreatitis in someone who has a history of pancreatitis. She tells me that many years ago showed an episode of gallstone pancreatitis so she has been concerned about a recurrence. Again, she has taken the samples for several weeks and did not develop  abdominal pain, nausea, vomiting, or loss of appetite.  Outpatient Medications Prior to Visit  Medication Sig Dispense Refill  . Buprenorphine (BUTRANS) 7.5 MCG/HR PTWK Place 1 patch onto the skin once a week. 4 patch 5  . canagliflozin (INVOKANA) 100 MG TABS tablet Take 1 tablet (100 mg total) by mouth daily before breakfast. 90 tablet 1  . Cholecalciferol 2000 units TABS Take 1 tablet (2,000 Units total) by mouth daily. 90 tablet 3  . cyanocobalamin 2000 MCG tablet Take 1 tablet (2,000 mcg total) by mouth daily. 90 tablet 3  . ibuprofen (ADVIL,MOTRIN) 200 MG tablet Take 400 mg by mouth every 6 (six) hours as needed for headache, mild pain or moderate pain. Reported on 08/07/2015    . LORazepam (ATIVAN) 1 MG tablet Take 1 tablet (1 mg total) by mouth every 8 (eight) hours as needed for anxiety. 12 tablet 0  . pantoprazole (PROTONIX) 40 MG tablet TAKE 1 TABLET (40 MG TOTAL) BY MOUTH DAILY. REPORTED ON 08/07/2015 90 tablet 3  . sitaGLIPtin (JANUVIA) 100 MG tablet Take 1 tablet (100 mg total) by mouth daily. 90 tablet 1  . tizanidine (ZANAFLEX) 2 MG capsule TAKE ONE CAPSULE BY MOUTH 3 TIMES A DAY 90 capsule 3  . traZODone (DESYREL) 50 MG tablet Reported on 08/07/2015  3  .  sertraline (ZOLOFT) 50 MG tablet Take 100 mg by mouth daily.   4   No facility-administered medications prior to visit.     ROS Review of Systems  Constitutional: Negative for activity change, appetite change, diaphoresis, fatigue and unexpected weight change.  HENT: Negative.   Eyes: Negative.  Negative for visual disturbance.  Respiratory: Negative.  Negative for cough, choking, chest tightness, shortness of breath and stridor.   Cardiovascular: Negative.  Negative for chest pain, palpitations and leg swelling.  Gastrointestinal: Negative.  Negative for abdominal pain, diarrhea, nausea and vomiting.  Endocrine: Negative for polydipsia, polyphagia and polyuria.  Genitourinary: Negative.  Negative for difficulty urinating.  Musculoskeletal: Positive for arthralgias. Negative for back pain, myalgias and neck pain.  Skin: Negative.  Negative for color change.  Hematological: Negative.  Does not bruise/bleed easily.  Psychiatric/Behavioral: Negative.     Objective:  BP 118/78 (BP Location: Left Arm, Patient Position: Sitting, Cuff Size: Large)   Pulse 96   Temp 98.2 F (36.8 C) (Oral)   Resp 16   Ht 5\' 5"  (1.651 m)   Wt 229 lb 8 oz (104.1 kg)   SpO2 96%   BMI 38.19 kg/m   BP Readings from Last 3 Encounters:  02/24/16 118/78  01/21/16 106/74  10/29/15 100/78    Wt Readings from Last 3 Encounters:  02/24/16 229 lb 8 oz (104.1 kg)  01/21/16 228  lb (103.4 kg)  10/29/15 223 lb (101.2 kg)    Physical Exam  Constitutional: She is oriented to person, place, and time. No distress.  HENT:  Mouth/Throat: Oropharynx is clear and moist. No oropharyngeal exudate.  Eyes: Conjunctivae are normal. Right eye exhibits no discharge. Left eye exhibits no discharge. No scleral icterus.  Neck: Normal range of motion. Neck supple. No JVD present. No tracheal deviation present. No thyromegaly present.  Cardiovascular: Normal rate, regular rhythm, normal heart sounds and intact distal pulses.   Exam reveals no gallop and no friction rub.   No murmur heard. Pulmonary/Chest: Effort normal and breath sounds normal. No stridor. No respiratory distress. She has no wheezes. She has no rales. She exhibits no tenderness.  Abdominal: Soft. Bowel sounds are normal. She exhibits no distension and no mass. There is no tenderness. There is no rebound and no guarding.  Musculoskeletal: Normal range of motion. She exhibits no edema, tenderness or deformity.  Lymphadenopathy:    She has no cervical adenopathy.  Neurological: She is oriented to person, place, and time.  Skin: Skin is warm and dry. No rash noted. She is not diaphoretic. No erythema. No pallor.  Vitals reviewed.   Lab Results  Component Value Date   WBC 7.8 10/29/2015   HGB 15.3 (H) 10/29/2015   HCT 44.8 10/29/2015   PLT 193.0 10/29/2015   GLUCOSE 185 (H) 10/29/2015   CHOL 210 (H) 08/07/2015   TRIG 264.0 (H) 08/07/2015   HDL 53.40 08/07/2015   LDLDIRECT 105.0 08/07/2015   LDLCALC 90 05/22/2014   ALT 57 (H) 10/29/2015   AST 96 (H) 10/29/2015   NA 137 10/29/2015   K 4.7 10/29/2015   CL 100 10/29/2015   CREATININE 0.88 10/29/2015   BUN 13 10/29/2015   CO2 29 10/29/2015   TSH 2.33 08/07/2015   INR 1.33 07/25/2014   HGBA1C 7.9 01/21/2016    Dg Cervical Spine Complete  Result Date: 08/07/2015 CLINICAL DATA:  Left side neck pain, left shoulder pain, cervical radiculitis EXAM: CERVICAL SPINE - COMPLETE 4+ VIEW COMPARISON:  None. FINDINGS: Five views of cervical spine submitted. No acute fracture or subluxation. Mild disc space flattening with anterior spurring at C5-C6 and C6-C7 level. No prevertebral soft tissue swelling. Cervical airway is patent. Bilateral mild narrowing of neural foramina at C5-C6 level. IMPRESSION: No acute fracture or subluxation. Degenerative changes at C5-C6 and C6-C7 level. Electronically Signed   By: Lahoma Crocker M.D.   On: 08/07/2015 15:23    Assessment & Plan:   Clarke was seen today for  diabetes.  Diagnoses and all orders for this visit:  Essential hypertension- her blood pressures adequately well-controlled  Type 2 diabetes mellitus with complication, without long-term current use of insulin ([place])- she is taking the samples and has had no symptoms, her prior episode of pancreatitis was related to gallstones so I think it is safe for her to take Invokana and Januvia, she agrees and will start taking them.   I am having Ms. Blethen maintain her ibuprofen, LORazepam, traZODone, Cholecalciferol, cyanocobalamin, Buprenorphine, tizanidine, pantoprazole, canagliflozin, sitaGLIPtin, and sertraline.  Meds ordered this encounter  Medications  . sertraline (ZOLOFT) 100 MG tablet    Sig: Take 100 mg by mouth daily.    Refill:  12     Follow-up: Return in about 4 months (around 06/26/2016).  [name], MD

## 2016-02-26 ENCOUNTER — Ambulatory Visit: Payer: Self-pay | Admitting: Internal Medicine

## 2016-02-26 ENCOUNTER — Telehealth: Payer: Self-pay | Admitting: Internal Medicine

## 2016-02-26 NOTE — Telephone Encounter (Signed)
Patient Name: [name]  DOB: 1946-04-11    Initial Comment Caller states she was seen yesterday, she has questions about being a Diabetic. No symptoms at this time.   Nurse Assessment  Nurse: Mallie Mussel, RN, Alveta Heimlich Date/Time Eilene Ghazi Time): 02/26/2016 1:36:07 PM  Confirm and document reason for call. If symptomatic, describe symptoms. You must click the next button to save text entered. ---Caller states that her doctor diagnosed her as being diabetic. She has a glucometer that she purchased yesterday at [facility] herself. She purchased a One Touch Ultra. I walked her through testing her blood sugar with her new meter. Her reading was 129. She had eaten within the hour. I advised her to always wait about 2 hours after eating to check her reading, but that isn't bad. She asked the normal range. I advised her that the normal is considered to be 70-100. She wanted to know what level she needed to call us back. Advised her if she gets a reading over 300, wait about 30 minutes and then test again. If she is still getting over 300 to call us. She verbalized understanding  Has the patient traveled out of the country within the last 30 days? ---Not Applicable  Does the patient have any new or worsening symptoms? ---No     Guidelines    Guideline Title Affirmed Question Affirmed Notes       Final Disposition User   Clinical Call Mallie Mussel, RN, Alveta Heimlich

## 2016-02-27 ENCOUNTER — Other Ambulatory Visit: Payer: Self-pay | Admitting: Internal Medicine

## 2016-02-27 ENCOUNTER — Telehealth: Payer: Self-pay

## 2016-02-27 DIAGNOSIS — M5412 Radiculopathy, cervical region: Secondary | ICD-10-CM

## 2016-02-27 NOTE — Telephone Encounter (Signed)
Patient called in and states that she got a call about making an app for a kidney test from "someone". Jonelle Sidle and I looked all in the chart and can not seem to find anything about this. She also stated that they wanted her to do this test due to a recently diabetic diagnosis. After talking with her some more we believe it could of been a insurance call. Just unsure how to go forward at this time. Please advise or follow up on this. Thank you.

## 2016-03-01 NOTE — Telephone Encounter (Signed)
Faxed script [facility] to [facility].../lmb 

## 2016-03-04 ENCOUNTER — Telehealth: Payer: Self-pay | Admitting: Emergency Medicine

## 2016-03-04 ENCOUNTER — Other Ambulatory Visit: Payer: Self-pay | Admitting: Internal Medicine

## 2016-03-04 DIAGNOSIS — G43001 Migraine without aura, not intractable, with status migrainosus: Secondary | ICD-10-CM | POA: Insufficient documentation

## 2016-03-04 MED ORDER — SUMATRIPTAN-NAPROXEN SODIUM 85-500 MG PO TABS: 1.0000 | ORAL_TABLET | ORAL | 1 refills | Status: DC | PRN

## 2016-03-04 NOTE — Telephone Encounter (Signed)
Pt was here on 7/25, please advise. Thanks.

## 2016-03-04 NOTE — Telephone Encounter (Signed)
Pt called stating she has had a migraine all week She was wondering if Dr Ronnald Ramp could call something in her her so she doesn't have to make an appointment. Please follow up thanks.

## 2016-03-04 NOTE — Telephone Encounter (Signed)
rx sent

## 2016-03-05 ENCOUNTER — Other Ambulatory Visit: Payer: Self-pay | Admitting: Internal Medicine

## 2016-03-05 DIAGNOSIS — G43001 Migraine without aura, not intractable, with status migrainosus: Secondary | ICD-10-CM

## 2016-03-05 MED ORDER — SUMATRIPTAN SUCCINATE 50 MG PO TABS: 50.0000 mg | ORAL_TABLET | ORAL | 1 refills | Status: DC | PRN

## 2016-03-05 NOTE — Telephone Encounter (Signed)
Pt states that rx cost 117.00 per pill.

## 2016-03-05 NOTE — Telephone Encounter (Signed)
Pt stated the prescription Dr Ronnald Ramp wrote for her migraine is too expensive. Can she get something different that is cheaper? Please follow up thanks.

## 2016-03-05 NOTE — Telephone Encounter (Signed)
rx changed

## 2016-03-08 NOTE — Telephone Encounter (Signed)
LVM for pt to call back as soon as possible.   RE: has pt been able to pick up new rx for migraine.

## 2016-03-11 ENCOUNTER — Other Ambulatory Visit: Payer: Self-pay | Admitting: Internal Medicine

## 2016-03-17 ENCOUNTER — Ambulatory Visit (INDEPENDENT_AMBULATORY_CARE_PROVIDER_SITE_OTHER): Payer: Medicare Other | Admitting: Internal Medicine

## 2016-03-17 ENCOUNTER — Encounter: Payer: Self-pay | Admitting: Internal Medicine

## 2016-03-17 VITALS — BP 148/98 | HR 110 | Temp 98.9°F | Ht 65.0 in | Wt 234.0 lb

## 2016-03-17 DIAGNOSIS — I5189 Other ill-defined heart diseases: Secondary | ICD-10-CM

## 2016-03-17 DIAGNOSIS — I519 Heart disease, unspecified: Secondary | ICD-10-CM | POA: Diagnosis not present

## 2016-03-17 DIAGNOSIS — I10 Essential (primary) hypertension: Secondary | ICD-10-CM | POA: Diagnosis not present

## 2016-03-17 DIAGNOSIS — G43001 Migraine without aura, not intractable, with status migrainosus: Secondary | ICD-10-CM | POA: Diagnosis not present

## 2016-03-17 MED ORDER — NEBIVOLOL HCL 10 MG PO TABS: 10.0000 mg | ORAL_TABLET | Freq: Every day | ORAL | 3 refills | Status: DC

## 2016-03-17 NOTE — Progress Notes (Signed)
Pre visit review using our clinic review tool, if applicable. No additional management support is needed unless otherwise documented below in the visit note. 

## 2016-03-17 NOTE — Progress Notes (Signed)
Subjective:  Patient ID: [name], female    DOB: 1946/01/08  Age: 70 y.o. MRN: [id number]  CC: Headache; Diabetes; and Hypertension   HPI MISTIQUE AUTEN presents for follow-up regarding a recent complaint of headaches. She now tells me that she has had the same headache pattern since she was 70 years of age. She tells me her last headache was about 3 days ago. She describes the pain as starting on the right side of her neck and moves up into her right scalp and right frontal area. She describes it as a throbbing sensation that is rarely associated with nausea and vomiting. The headache responds well to sumatriptan. The headaches a been a little more frequently recently but have not changed in their severity or description. She is also due for blood pressure check.  Outpatient Medications Prior to Visit  Medication Sig Dispense Refill  . BUTRANS 7.5 MCG/HR PTWK PLACE 1 PATCH ON SKIN ONCE A WEEK 4 patch 5  . canagliflozin (INVOKANA) 100 MG TABS tablet Take 1 tablet (100 mg total) by mouth daily before breakfast. 90 tablet 1  . Cholecalciferol 2000 units TABS Take 1 tablet (2,000 Units total) by mouth daily. 90 tablet 3  . cyanocobalamin 2000 MCG tablet Take 1 tablet (2,000 mcg total) by mouth daily. 90 tablet 3  . ibuprofen (ADVIL,MOTRIN) 200 MG tablet Take 400 mg by mouth every 6 (six) hours as needed for headache, mild pain or moderate pain. Reported on 08/07/2015    . LORazepam (ATIVAN) 1 MG tablet Take 1 tablet (1 mg total) by mouth every 8 (eight) hours as needed for anxiety. 12 tablet 0  . pantoprazole (PROTONIX) 40 MG tablet TAKE 1 TABLET (40 MG TOTAL) BY MOUTH DAILY. REPORTED ON 08/07/2015 90 tablet 3  . sertraline (ZOLOFT) 100 MG tablet Take 100 mg by mouth daily.  12  . sitaGLIPtin (JANUVIA) 100 MG tablet Take 1 tablet (100 mg total) by mouth daily. 90 tablet 1  . SUMAtriptan-naproxen (TREXIMET) 85-500 MG tablet Take 1 tablet by mouth every 2 (two) hours as needed for  migraine. 10 tablet 1  . tizanidine (ZANAFLEX) 2 MG capsule TAKE ONE CAPSULE BY MOUTH 3 TIMES A DAY 90 capsule 3  . traZODone (DESYREL) 50 MG tablet Reported on 08/07/2015  3  . SUMAtriptan (IMITREX) 50 MG tablet Take 1 tablet (50 mg total) by mouth every 2 (two) hours as needed for migraine. May repeat in 2 hours if headache persists or recurs. 10 tablet 1   No facility-administered medications prior to visit.     ROS Review of Systems  Constitutional: Negative.  Negative for appetite change, fatigue and unexpected weight change.  Eyes: Negative for photophobia and visual disturbance.  Respiratory: Negative.  Negative for cough, choking, shortness of breath and stridor.   Cardiovascular: Negative.  Negative for chest pain, palpitations and leg swelling.  Gastrointestinal: Negative.  Negative for abdominal pain, blood in stool, constipation, diarrhea, nausea and vomiting.  Endocrine: Negative.   Genitourinary: Negative.   Musculoskeletal: Positive for neck pain. Negative for arthralgias, back pain, myalgias and neck stiffness.  Skin: Negative.   Allergic/Immunologic: Negative.   Neurological: Positive for headaches. Negative for dizziness, tremors, syncope, facial asymmetry, speech difficulty, light-headedness and numbness.  Hematological: Negative.  Negative for adenopathy. Does not bruise/bleed easily.  Psychiatric/Behavioral: Positive for sleep disturbance. Negative for confusion, decreased concentration and dysphoric mood. The patient is nervous/anxious.     Objective:  BP (!) 148/98 (BP Location: Left Arm,  Patient Position: Sitting, Cuff Size: Large)   Pulse (!) 110   Temp 98.9 F (37.2 C) (Oral)   Ht 5\' 5"  (1.651 m)   Wt 234 lb (106.1 kg)   SpO2 96%   BMI 38.94 kg/m   BP Readings from Last 3 Encounters:  03/17/16 (!) 148/98  02/24/16 118/78  01/21/16 106/74    Wt Readings from Last 3 Encounters:  03/17/16 234 lb (106.1 kg)  02/24/16 229 lb 8 oz (104.1 kg)  01/21/16  228 lb (103.4 kg)    Physical Exam  Constitutional: She appears well-developed and well-nourished. No distress.  HENT:  Head: Normocephalic and atraumatic.  Mouth/Throat: Oropharynx is clear and moist. No oropharyngeal exudate.  Eyes: Conjunctivae and EOM are normal. Pupils are equal, round, and reactive to light. Right eye exhibits no discharge. Left eye exhibits no discharge. No scleral icterus.  Neck: Normal range of motion. Neck supple. No JVD present. No tracheal deviation present. No thyromegaly present.  Cardiovascular: Normal rate, regular rhythm, normal heart sounds and intact distal pulses.  Exam reveals no gallop and no friction rub.   No murmur heard. Pulmonary/Chest: Effort normal and breath sounds normal. No stridor. No respiratory distress. She has no wheezes. She has no rales. She exhibits no tenderness.  Abdominal: Soft. Bowel sounds are normal. She exhibits no distension and no mass. There is no tenderness. There is no rebound and no guarding.  Musculoskeletal: Normal range of motion. She exhibits no edema, tenderness or deformity.  Lymphadenopathy:    She has no cervical adenopathy.  Neurological: She is alert. She has normal strength. She displays no atrophy, no tremor and normal reflexes. No cranial nerve deficit or sensory deficit. She exhibits normal muscle tone. She displays no seizure activity. Coordination and gait normal. She displays no Babinski's sign on the right side. She displays no Babinski's sign on the left side.  Reflex Scores:      Tricep reflexes are 0 on the right side and 0 on the left side.      Bicep reflexes are 0 on the right side and 0 on the left side.      Brachioradialis reflexes are 0 on the right side and 0 on the left side.      Patellar reflexes are 0 on the right side and 0 on the left side.      Achilles reflexes are 0 on the right side and 0 on the left side. Skin: Skin is warm and dry. No rash noted. She is not diaphoretic. No erythema.  No pallor.  Vitals reviewed.   Lab Results  Component Value Date   WBC 7.8 10/29/2015   HGB 15.3 (H) 10/29/2015   HCT 44.8 10/29/2015   PLT 193.0 10/29/2015   GLUCOSE 185 (H) 10/29/2015   CHOL 210 (H) 08/07/2015   TRIG 264.0 (H) 08/07/2015   HDL 53.40 08/07/2015   LDLDIRECT 105.0 08/07/2015   LDLCALC 90 05/22/2014   ALT 57 (H) 10/29/2015   AST 96 (H) 10/29/2015   NA 137 10/29/2015   K 4.7 10/29/2015   CL 100 10/29/2015   CREATININE 0.88 10/29/2015   BUN 13 10/29/2015   CO2 29 10/29/2015   TSH 2.33 08/07/2015   INR 1.33 07/25/2014   HGBA1C 7.9 01/21/2016    Dg Cervical Spine Complete  Result Date: 08/07/2015 CLINICAL DATA:  Left side neck pain, left shoulder pain, cervical radiculitis EXAM: CERVICAL SPINE - COMPLETE 4+ VIEW COMPARISON:  None. FINDINGS: Five views of cervical spine  submitted. No acute fracture or subluxation. Mild disc space flattening with anterior spurring at C5-C6 and C6-C7 level. No prevertebral soft tissue swelling. Cervical airway is patent. Bilateral mild narrowing of neural foramina at C5-C6 level. IMPRESSION: No acute fracture or subluxation. Degenerative changes at C5-C6 and C6-C7 level. Electronically Signed   By: Lahoma Crocker M.D.   On: 08/07/2015 15:23    Assessment & Plan:   Annalyce was seen today for headache, diabetes and hypertension.  Diagnoses and all orders for this visit:  Essential hypertension- Her blood pressure is not well controlled, she has migraine headaches and an elevated heart rate, will start an alpha beta blocker to lower her blood pressure. -     nebivolol (BYSTOLIC) 10 MG tablet; Take 1 tablet (10 mg total) by mouth daily.  Left ventricular diastolic dysfunction, NYHA class 2- she needs to have better blood pressure control, will start an alpha beta blocker. -     nebivolol (BYSTOLIC) 10 MG tablet; Take 1 tablet (10 mg total) by mouth daily.  Migraine without aura and with status migrainosus, not intractable- she describes  headaches that are more frequent but not worsening and there are no concerning or atypical features. I think starting a beta blocker is a good idea to prophylax against migraine headaches, she is getting a good response from the sumatriptan naproxen combination so I will continue that. -     nebivolol (BYSTOLIC) 10 MG tablet; Take 1 tablet (10 mg total) by mouth daily.   I have discontinued Ms. Bordwell's SUMAtriptan. I am also having her start on nebivolol. Additionally, I am having her maintain her ibuprofen, LORazepam, traZODone, Cholecalciferol, cyanocobalamin, pantoprazole, canagliflozin, sitaGLIPtin, sertraline, BUTRANS, SUMAtriptan-naproxen, and tizanidine.  Meds ordered this encounter  Medications  . nebivolol (BYSTOLIC) 10 MG tablet    Sig: Take 1 tablet (10 mg total) by mouth daily.    Dispense:  90 tablet    Refill:  3     Follow-up: Return in about 6 weeks (around 04/28/2016).  [name], MD

## 2016-03-17 NOTE — Patient Instructions (Signed)
Hypertension Hypertension, commonly called high blood pressure, is when the force of blood pumping through your arteries is too strong. Your arteries are the blood vessels that carry blood from your heart throughout your body. A blood pressure reading consists of a higher number over a lower number, such as 110/72. The higher number (systolic) is the pressure inside your arteries when your heart pumps. The lower number (diastolic) is the pressure inside your arteries when your heart relaxes. Ideally you want your blood pressure below 120/80. Hypertension forces your heart to work harder to pump blood. Your arteries may become narrow or stiff. Having untreated or uncontrolled hypertension can cause heart attack, stroke, kidney disease, and other problems. RISK FACTORS Some risk factors for high blood pressure are controllable. Others are not.  Risk factors you cannot control include:   Race. You may be at higher risk if you are African American.  Age. Risk increases with age.  Gender. Men are at higher risk than women before age 45 years. After age 65, women are at higher risk than men. Risk factors you can control include:  Not getting enough exercise or physical activity.  Being overweight.  Getting too much fat, sugar, calories, or salt in your diet.  Drinking too much alcohol. SIGNS AND SYMPTOMS Hypertension does not usually cause signs or symptoms. Extremely high blood pressure (hypertensive crisis) may cause headache, anxiety, shortness of breath, and nosebleed. DIAGNOSIS To check if you have hypertension, your health care provider will measure your blood pressure while you are seated, with your arm held at the level of your heart. It should be measured at least twice using the same arm. Certain conditions can cause a difference in blood pressure between your right and left arms. A blood pressure reading that is higher than normal on one occasion does not mean that you need treatment. If  it is not clear whether you have high blood pressure, you may be asked to return on a different day to have your blood pressure checked again. Or, you may be asked to monitor your blood pressure at home for 1 or more weeks. TREATMENT Treating high blood pressure includes making lifestyle changes and possibly taking medicine. Living a healthy lifestyle can help lower high blood pressure. You may need to change some of your habits. Lifestyle changes may include:  Following the DASH diet. This diet is high in fruits, vegetables, and whole grains. It is low in salt, red meat, and added sugars.  Keep your sodium intake below 2,300 mg per day.  Getting at least 30-45 minutes of aerobic exercise at least 4 times per week.  Losing weight if necessary.  Not smoking.  Limiting alcoholic beverages.  Learning ways to reduce stress. Your health care provider may prescribe medicine if lifestyle changes are not enough to get your blood pressure under control, and if one of the following is true:  You are 18-59 years of age and your systolic blood pressure is above 140.  You are 60 years of age or older, and your systolic blood pressure is above 150.  Your diastolic blood pressure is above 90.  You have diabetes, and your systolic blood pressure is over 140 or your diastolic blood pressure is over 90.  You have kidney disease and your blood pressure is above 140/90.  You have heart disease and your blood pressure is above 140/90. Your personal target blood pressure may vary depending on your medical conditions, your age, and other factors. HOME CARE INSTRUCTIONS    Have your blood pressure rechecked as directed by your health care provider.   Take medicines only as directed by your health care provider. Follow the directions carefully. Blood pressure medicines must be taken as prescribed. The medicine does not work as well when you skip doses. Skipping doses also puts you at risk for  problems.  Do not smoke.   Monitor your blood pressure at home as directed by your health care provider. SEEK MEDICAL CARE IF:   You think you are having a reaction to medicines taken.  You have recurrent headaches or feel dizzy.  You have swelling in your ankles.  You have trouble with your vision. SEEK IMMEDIATE MEDICAL CARE IF:  You develop a severe headache or confusion.  You have unusual weakness, numbness, or feel faint.  You have severe chest or abdominal pain.  You vomit repeatedly.  You have trouble breathing. MAKE SURE YOU:   Understand these instructions.  Will watch your condition.  Will get help right away if you are not doing well or get worse.   This information is not intended to replace advice given to you by your health care provider. Make sure you discuss any questions you have with your health care provider.   Document Released: 07/19/2005 Document Revised: 12/03/2014 Document Reviewed: 05/11/2013 Elsevier Interactive Patient Education 2016 [employer]  

## 2016-04-14 ENCOUNTER — Encounter: Payer: Self-pay | Admitting: Internal Medicine

## 2016-04-14 ENCOUNTER — Ambulatory Visit (INDEPENDENT_AMBULATORY_CARE_PROVIDER_SITE_OTHER): Payer: Medicare Other | Admitting: Internal Medicine

## 2016-04-14 VITALS — BP 128/82 | HR 82 | Temp 98.6°F | Resp 16 | Ht 65.0 in | Wt 234.0 lb

## 2016-04-14 DIAGNOSIS — E118 Type 2 diabetes mellitus with unspecified complications: Secondary | ICD-10-CM

## 2016-04-14 DIAGNOSIS — D519 Vitamin B12 deficiency anemia, unspecified: Secondary | ICD-10-CM | POA: Diagnosis not present

## 2016-04-14 DIAGNOSIS — I10 Essential (primary) hypertension: Secondary | ICD-10-CM

## 2016-04-14 DIAGNOSIS — Z23 Encounter for immunization: Secondary | ICD-10-CM

## 2016-04-14 DIAGNOSIS — E2839 Other primary ovarian failure: Secondary | ICD-10-CM | POA: Diagnosis not present

## 2016-04-14 DIAGNOSIS — E785 Hyperlipidemia, unspecified: Secondary | ICD-10-CM

## 2016-04-14 DIAGNOSIS — Z0000 Encounter for general adult medical examination without abnormal findings: Secondary | ICD-10-CM

## 2016-04-14 DIAGNOSIS — Z1231 Encounter for screening mammogram for malignant neoplasm of breast: Secondary | ICD-10-CM

## 2016-04-14 DIAGNOSIS — E669 Obesity, unspecified: Secondary | ICD-10-CM

## 2016-04-14 NOTE — Patient Instructions (Signed)
Preventive Care for Adults, Female A healthy lifestyle and preventive care can promote health and wellness. Preventive health guidelines for women include the following key practices.  A routine yearly physical is a good way to check with your health care provider about your health and preventive screening. It is a chance to share any concerns and updates on your health and to receive a thorough exam.  Visit your dentist for a routine exam and preventive care every 6 months. Brush your teeth twice a day and floss once a day. Good oral hygiene prevents tooth decay and gum disease.  The frequency of eye exams is based on your age, health, family medical history, use of contact lenses, and other factors. Follow your health care provider's recommendations for frequency of eye exams.  Eat a healthy diet. Foods like vegetables, fruits, whole grains, low-fat dairy products, and lean protein foods contain the nutrients you need without too many calories. Decrease your intake of foods high in solid fats, added sugars, and salt. Eat the right amount of calories for you.Get information about a proper diet from your health care provider, if necessary.  Regular physical exercise is one of the most important things you can do for your health. Most adults should get at least 150 minutes of moderate-intensity exercise (any activity that increases your heart rate and causes you to sweat) each week. In addition, most adults need muscle-strengthening exercises on 2 or more days a week.  Maintain a healthy weight. The body mass index (BMI) is a screening tool to identify possible weight problems. It provides an estimate of body fat based on height and weight. Your health care provider can find your BMI and can help you achieve or maintain a healthy weight.For adults 20 years and older:  A BMI below 18.5 is considered underweight.  A BMI of 18.5 to 24.9 is normal.  A BMI of 25 to 29.9 is considered overweight.  A  BMI of 30 and above is considered obese.  Maintain normal blood lipids and cholesterol levels by exercising and minimizing your intake of saturated fat. Eat a balanced diet with plenty of fruit and vegetables. Blood tests for lipids and cholesterol should begin at age 45 and be repeated every 5 years. If your lipid or cholesterol levels are high, you are over 50, or you are at high risk for heart disease, you may need your cholesterol levels checked more frequently.Ongoing high lipid and cholesterol levels should be treated with medicines if diet and exercise are not working.  If you smoke, find out from your health care provider how to quit. If you do not use tobacco, do not start.  Lung cancer screening is recommended for adults aged 45-80 years who are at high risk for developing lung cancer because of a history of smoking. A yearly low-dose CT scan of the lungs is recommended for people who have at least a 30-pack-year history of smoking and are a current smoker or have quit within the past 15 years. A pack year of smoking is smoking an average of 1 pack of cigarettes a day for 1 year (for example: 1 pack a day for 30 years or 2 packs a day for 15 years). Yearly screening should continue until the smoker has stopped smoking for at least 15 years. Yearly screening should be stopped for people who develop a health problem that would prevent them from having lung cancer treatment.  If you are pregnant, do not drink alcohol. If you are  breastfeeding, be very cautious about drinking alcohol. If you are not pregnant and choose to drink alcohol, do not have more than 1 drink per day. One drink is considered to be 12 ounces (355 mL) of beer, 5 ounces (148 mL) of wine, or 1.5 ounces (44 mL) of liquor.  Avoid use of street drugs. Do not share needles with anyone. Ask for help if you need support or instructions about stopping the use of drugs.  High blood pressure causes heart disease and increases the risk  of stroke. Your blood pressure should be checked at least every 1 to 2 years. Ongoing high blood pressure should be treated with medicines if weight loss and exercise do not work.  If you are 59-44 years old, ask your health care provider if you should take aspirin to prevent strokes.  Diabetes screening is done by taking a blood sample to check your blood glucose level after you have not eaten for a certain period of time (fasting). If you are not overweight and you do not have risk factors for diabetes, you should be screened once every 3 years starting at age 54. If you are overweight or obese and you are 64-104 years of age, you should be screened for diabetes every year as part of your cardiovascular risk assessment.  Breast cancer screening is essential preventive care for women. You should practice "breast self-awareness." This means understanding the normal appearance and feel of your breasts and may include breast self-examination. Any changes detected, no matter how small, should be reported to a health care provider. Women in their 47s and 30s should have a clinical breast exam (CBE) by a health care provider as part of a regular health exam every 1 to 3 years. After age 42, women should have a CBE every year. Starting at age 64, women should consider having a mammogram (breast X-ray test) every year. Women who have a family history of breast cancer should talk to their health care provider about genetic screening. Women at a high risk of breast cancer should talk to their health care providers about having an MRI and a mammogram every year.  Breast cancer gene (BRCA)-related cancer risk assessment is recommended for women who have family members with BRCA-related cancers. BRCA-related cancers include breast, ovarian, tubal, and peritoneal cancers. Having family members with these cancers may be associated with an increased risk for harmful changes (mutations) in the breast cancer genes BRCA1 and  BRCA2. Results of the assessment will determine the need for genetic counseling and BRCA1 and BRCA2 testing.  Your health care provider may recommend that you be screened regularly for cancer of the pelvic organs (ovaries, uterus, and vagina). This screening involves a pelvic examination, including checking for microscopic changes to the surface of your cervix (Pap test). You may be encouraged to have this screening done every 3 years, beginning at age 35.  For women ages 62-65, health care providers may recommend pelvic exams and Pap testing every 3 years, or they may recommend the Pap and pelvic exam, combined with testing for human papilloma virus (HPV), every 5 years. Some types of HPV increase your risk of cervical cancer. Testing for HPV may also be done on women of any age with unclear Pap test results.  Other health care providers may not recommend any screening for nonpregnant women who are considered low risk for pelvic cancer and who do not have symptoms. Ask your health care provider if a screening pelvic exam is right for  you.  If you have had past treatment for cervical cancer or a condition that could lead to cancer, you need Pap tests and screening for cancer for at least 20 years after your treatment. If Pap tests have been discontinued, your risk factors (such as having a new sexual partner) need to be reassessed to determine if screening should resume. Some women have medical problems that increase the chance of getting cervical cancer. In these cases, your health care provider may recommend more frequent screening and Pap tests.  Colorectal cancer can be detected and often prevented. Most routine colorectal cancer screening begins at the age of 50 years and continues through age 75 years. However, your health care provider may recommend screening at an earlier age if you have risk factors for colon cancer. On a yearly basis, your health care provider may provide home test kits to check  for hidden blood in the stool. Use of a small camera at the end of a tube, to directly examine the colon (sigmoidoscopy or colonoscopy), can detect the earliest forms of colorectal cancer. Talk to your health care provider about this at age 50, when routine screening begins. Direct exam of the colon should be repeated every 5-10 years through age 75 years, unless early forms of precancerous polyps or small growths are found.  People who are at an increased risk for hepatitis B should be screened for this virus. You are considered at high risk for hepatitis B if:  You were born in a country where hepatitis B occurs often. Talk with your health care provider about which countries are considered high risk.  Your parents were born in a high-risk country and you have not received a shot to protect against hepatitis B (hepatitis B vaccine).  You have HIV or AIDS.  You use needles to inject street drugs.  You live with, or have sex with, someone who has hepatitis B.  You get hemodialysis treatment.  You take certain medicines for conditions like cancer, organ transplantation, and autoimmune conditions.  Hepatitis C blood testing is recommended for all people born from 1945 through 1965 and any individual with known risks for hepatitis C.  Practice safe sex. Use condoms and avoid high-risk sexual practices to reduce the spread of sexually transmitted infections (STIs). STIs include gonorrhea, chlamydia, syphilis, trichomonas, herpes, HPV, and human immunodeficiency virus (HIV). Herpes, HIV, and HPV are viral illnesses that have no cure. They can result in disability, cancer, and death.  You should be screened for sexually transmitted illnesses (STIs) including gonorrhea and chlamydia if:  You are sexually active and are younger than 24 years.  You are older than 24 years and your health care provider tells you that you are at risk for this type of infection.  Your sexual activity has changed  since you were last screened and you are at an increased risk for chlamydia or gonorrhea. Ask your health care provider if you are at risk.  If you are at risk of being infected with HIV, it is recommended that you take a prescription medicine daily to prevent HIV infection. This is called preexposure prophylaxis (PrEP). You are considered at risk if:  You are sexually active and do not regularly use condoms or know the HIV status of your partner(s).  You take drugs by injection.  You are sexually active with a partner who has HIV.  Talk with your health care provider about whether you are at high risk of being infected with HIV. If   you choose to begin PrEP, you should first be tested for HIV. You should then be tested every 3 months for as long as you are taking PrEP.  Osteoporosis is a disease in which the bones lose minerals and strength with aging. This can result in serious bone fractures or breaks. The risk of osteoporosis can be identified using a bone density scan. Women ages 67 years and over and women at risk for fractures or osteoporosis should discuss screening with their health care providers. Ask your health care provider whether you should take a calcium supplement or vitamin D to reduce the rate of osteoporosis.  Menopause can be associated with physical symptoms and risks. Hormone replacement therapy is available to decrease symptoms and risks. You should talk to your health care provider about whether hormone replacement therapy is right for you.  Use sunscreen. Apply sunscreen liberally and repeatedly throughout the day. You should seek shade when your shadow is shorter than you. Protect yourself by wearing long sleeves, pants, a wide-brimmed hat, and sunglasses year round, whenever you are outdoors.  Once a month, do a whole body skin exam, using a mirror to look at the skin on your back. Tell your health care provider of new moles, moles that have irregular borders, moles that  are larger than a pencil eraser, or moles that have changed in shape or color.  Stay current with required vaccines (immunizations).  Influenza vaccine. All adults should be immunized every year.  Tetanus, diphtheria, and acellular pertussis (Td, Tdap) vaccine. Pregnant women should receive 1 dose of Tdap vaccine during each pregnancy. The dose should be obtained regardless of the length of time since the last dose. Immunization is preferred during the 27th-36th week of gestation. An adult who has not previously received Tdap or who does not know her vaccine status should receive 1 dose of Tdap. This initial dose should be followed by tetanus and diphtheria toxoids (Td) booster doses every 10 years. Adults with an unknown or incomplete history of completing a 3-dose immunization series with Td-containing vaccines should begin or complete a primary immunization series including a Tdap dose. Adults should receive a Td booster every 10 years.  Varicella vaccine. An adult without evidence of immunity to varicella should receive 2 doses or a second dose if she has previously received 1 dose. Pregnant females who do not have evidence of immunity should receive the first dose after pregnancy. This first dose should be obtained before leaving the health care facility. The second dose should be obtained 4-8 weeks after the first dose.  Human papillomavirus (HPV) vaccine. Females aged 13-26 years who have not received the vaccine previously should obtain the 3-dose series. The vaccine is not recommended for use in pregnant females. However, pregnancy testing is not needed before receiving a dose. If a female is found to be pregnant after receiving a dose, no treatment is needed. In that case, the remaining doses should be delayed until after the pregnancy. Immunization is recommended for any person with an immunocompromised condition through the age of 61 years if she did not get any or all doses earlier. During the  3-dose series, the second dose should be obtained 4-8 weeks after the first dose. The third dose should be obtained 24 weeks after the first dose and 16 weeks after the second dose.  Zoster vaccine. One dose is recommended for adults aged 30 years or older unless certain conditions are present.  Measles, mumps, and rubella (MMR) vaccine. Adults born  before 1957 generally are considered immune to measles and mumps. Adults born in 1957 or later should have 1 or more doses of MMR vaccine unless there is a contraindication to the vaccine or there is laboratory evidence of immunity to each of the three diseases. A routine second dose of MMR vaccine should be obtained at least 28 days after the first dose for students attending postsecondary schools, health care workers, or international travelers. People who received inactivated measles vaccine or an unknown type of measles vaccine during 1963-1967 should receive 2 doses of MMR vaccine. People who received inactivated mumps vaccine or an unknown type of mumps vaccine before 1979 and are at high risk for mumps infection should consider immunization with 2 doses of MMR vaccine. For females of childbearing age, rubella immunity should be determined. If there is no evidence of immunity, females who are not pregnant should be vaccinated. If there is no evidence of immunity, females who are pregnant should delay immunization until after pregnancy. Unvaccinated health care workers born before 1957 who lack laboratory evidence of measles, mumps, or rubella immunity or laboratory confirmation of disease should consider measles and mumps immunization with 2 doses of MMR vaccine or rubella immunization with 1 dose of MMR vaccine.  Pneumococcal 13-valent conjugate (PCV13) vaccine. When indicated, a person who is uncertain of his immunization history and has no record of immunization should receive the PCV13 vaccine. All adults 65 years of age and older should receive this  vaccine. An adult aged 19 years or older who has certain medical conditions and has not been previously immunized should receive 1 dose of PCV13 vaccine. This PCV13 should be followed with a dose of pneumococcal polysaccharide (PPSV23) vaccine. Adults who are at high risk for pneumococcal disease should obtain the PPSV23 vaccine at least 8 weeks after the dose of PCV13 vaccine. Adults older than 69 years of age who have normal immune system function should obtain the PPSV23 vaccine dose at least 1 year after the dose of PCV13 vaccine.  Pneumococcal polysaccharide (PPSV23) vaccine. When PCV13 is also indicated, PCV13 should be obtained first. All adults aged 65 years and older should be immunized. An adult younger than age 65 years who has certain medical conditions should be immunized. Any person who resides in a nursing home or long-term care facility should be immunized. An adult smoker should be immunized. People with an immunocompromised condition and certain other conditions should receive both PCV13 and PPSV23 vaccines. People with human immunodeficiency virus (HIV) infection should be immunized as soon as possible after diagnosis. Immunization during chemotherapy or radiation therapy should be avoided. Routine use of PPSV23 vaccine is not recommended for American Indians, [place] Natives, or people younger than 65 years unless there are medical conditions that require PPSV23 vaccine. When indicated, people who have unknown immunization and have no record of immunization should receive PPSV23 vaccine. One-time revaccination 5 years after the first dose of PPSV23 is recommended for people aged 19-64 years who have chronic kidney failure, nephrotic syndrome, asplenia, or immunocompromised conditions. People who received 1-2 doses of PPSV23 before age 65 years should receive another dose of PPSV23 vaccine at age 65 years or later if at least 5 years have passed since the previous dose. Doses of PPSV23 are not  needed for people immunized with PPSV23 at or after age 65 years.  Meningococcal vaccine. Adults with asplenia or persistent complement component deficiencies should receive 2 doses of quadrivalent meningococcal conjugate (MenACWY-D) vaccine. The doses should be obtained   at least 2 months apart. Microbiologists working with certain meningococcal bacteria, [employer] recruits, people at risk during an outbreak, and people who travel to or live in countries with a high rate of meningitis should be immunized. A first-year college student up through age 32 years who is living in a residence hall should receive a dose if she did not receive a dose on or after her 16th birthday. Adults who have certain high-risk conditions should receive one or more doses of vaccine.  Hepatitis A vaccine. Adults who wish to be protected from this disease, have certain high-risk conditions, work with hepatitis A-infected animals, work in hepatitis A research labs, or travel to or work in countries with a high rate of hepatitis A should be immunized. Adults who were previously unvaccinated and who anticipate close contact with an international adoptee during the first 60 days after arrival in the [place] from a country with a high rate of hepatitis A should be immunized.  Hepatitis B vaccine. Adults who wish to be protected from this disease, have certain high-risk conditions, may be exposed to blood or other infectious body fluids, are household contacts or sex partners of hepatitis B positive people, are clients or workers in certain care facilities, or travel to or work in countries with a high rate of hepatitis B should be immunized.  Haemophilus influenzae type b (Hib) vaccine. A previously unvaccinated person with asplenia or sickle cell disease or having a scheduled splenectomy should receive 1 dose of Hib vaccine. Regardless of previous immunization, a recipient of a hematopoietic stem cell transplant should receive a  3-dose series 6-12 months after her successful transplant. Hib vaccine is not recommended for adults with HIV infection. Preventive Services / Frequency Ages 56 to 106 years  Blood pressure check.** / Every 3-5 years.  Lipid and cholesterol check.** / Every 5 years beginning at age 15.  Clinical breast exam.** / Every 3 years for women in their 27s and 61s.  BRCA-related cancer risk assessment.** / For women who have family members with a BRCA-related cancer (breast, ovarian, tubal, or peritoneal cancers).  Pap test.** / Every 2 years from ages 52 through 33. Every 3 years starting at age 58 through age 67 or 68 with a history of 3 consecutive normal Pap tests.  HPV screening.** / Every 3 years from ages 7 through ages 47 to 78 with a history of 3 consecutive normal Pap tests.  Hepatitis C blood test.** / For any individual with known risks for hepatitis C.  Skin self-exam. / Monthly.  Influenza vaccine. / Every year.  Tetanus, diphtheria, and acellular pertussis (Tdap, Td) vaccine.** / Consult your health care provider. Pregnant women should receive 1 dose of Tdap vaccine during each pregnancy. 1 dose of Td every 10 years.  Varicella vaccine.** / Consult your health care provider. Pregnant females who do not have evidence of immunity should receive the first dose after pregnancy.  HPV vaccine. / 3 doses over 6 months, if 17 and younger. The vaccine is not recommended for use in pregnant females. However, pregnancy testing is not needed before receiving a dose.  Measles, mumps, rubella (MMR) vaccine.** / You need at least 1 dose of MMR if you were born in 1957 or later. You may also need a 2nd dose. For females of childbearing age, rubella immunity should be determined. If there is no evidence of immunity, females who are not pregnant should be vaccinated. If there is no evidence of immunity, females who are  pregnant should delay immunization until after pregnancy.  Pneumococcal  13-valent conjugate (PCV13) vaccine.** / Consult your health care provider.  Pneumococcal polysaccharide (PPSV23) vaccine.** / 1 to 2 doses if you smoke cigarettes or if you have certain conditions.  Meningococcal vaccine.** / 1 dose if you are age 68 to 8 years and a Market researcher living in a residence hall, or have one of several medical conditions, you need to get vaccinated against meningococcal disease. You may also need additional booster doses.  Hepatitis A vaccine.** / Consult your health care provider.  Hepatitis B vaccine.** / Consult your health care provider.  Haemophilus influenzae type b (Hib) vaccine.** / Consult your health care provider. Ages 7 to 53 years  Blood pressure check.** / Every year.  Lipid and cholesterol check.** / Every 5 years beginning at age 25 years.  Lung cancer screening. / Every year if you are aged 11-80 years and have a 30-pack-year history of smoking and currently smoke or have quit within the past 15 years. Yearly screening is stopped once you have quit smoking for at least 15 years or develop a health problem that would prevent you from having lung cancer treatment.  Clinical breast exam.** / Every year after age 48 years.  BRCA-related cancer risk assessment.** / For women who have family members with a BRCA-related cancer (breast, ovarian, tubal, or peritoneal cancers).  Mammogram.** / Every year beginning at age 41 years and continuing for as long as you are in good health. Consult with your health care provider.  Pap test.** / Every 3 years starting at age 65 years through age 37 or 70 years with a history of 3 consecutive normal Pap tests.  HPV screening.** / Every 3 years from ages 72 years through ages 60 to 40 years with a history of 3 consecutive normal Pap tests.  Fecal occult blood test (FOBT) of stool. / Every year beginning at age 21 years and continuing until age 5 years. You may not need to do this test if you get  a colonoscopy every 10 years.  Flexible sigmoidoscopy or colonoscopy.** / Every 5 years for a flexible sigmoidoscopy or every 10 years for a colonoscopy beginning at age 35 years and continuing until age 48 years.  Hepatitis C blood test.** / For all people born from [birth year] through 1965 and any individual with known risks for hepatitis C.  Skin self-exam. / Monthly.  Influenza vaccine. / Every year.  Tetanus, diphtheria, and acellular pertussis (Tdap/Td) vaccine.** / Consult your health care provider. Pregnant women should receive 1 dose of Tdap vaccine during each pregnancy. 1 dose of Td every 10 years.  Varicella vaccine.** / Consult your health care provider. Pregnant females who do not have evidence of immunity should receive the first dose after pregnancy.  Zoster vaccine.** / 1 dose for adults aged 30 years or older.  Measles, mumps, rubella (MMR) vaccine.** / You need at least 1 dose of MMR if you were born in 1957 or later. You may also need a second dose. For females of childbearing age, rubella immunity should be determined. If there is no evidence of immunity, females who are not pregnant should be vaccinated. If there is no evidence of immunity, females who are pregnant should delay immunization until after pregnancy.  Pneumococcal 13-valent conjugate (PCV13) vaccine.** / Consult your health care provider.  Pneumococcal polysaccharide (PPSV23) vaccine.** / 1 to 2 doses if you smoke cigarettes or if you have certain conditions.  Meningococcal vaccine.** /  Consult your health care provider.  Hepatitis A vaccine.** / Consult your health care provider.  Hepatitis B vaccine.** / Consult your health care provider.  Haemophilus influenzae type b (Hib) vaccine.** / Consult your health care provider. Ages 64 years and over  Blood pressure check.** / Every year.  Lipid and cholesterol check.** / Every 5 years beginning at age 23 years.  Lung cancer screening. / Every year if you  are aged 16-80 years and have a 30-pack-year history of smoking and currently smoke or have quit within the past 15 years. Yearly screening is stopped once you have quit smoking for at least 15 years or develop a health problem that would prevent you from having lung cancer treatment.  Clinical breast exam.** / Every year after age 74 years.  BRCA-related cancer risk assessment.** / For women who have family members with a BRCA-related cancer (breast, ovarian, tubal, or peritoneal cancers).  Mammogram.** / Every year beginning at age 44 years and continuing for as long as you are in good health. Consult with your health care provider.  Pap test.** / Every 3 years starting at age 58 years through age 22 or 39 years with 3 consecutive normal Pap tests. Testing can be stopped between 65 and 70 years with 3 consecutive normal Pap tests and no abnormal Pap or HPV tests in the past 10 years.  HPV screening.** / Every 3 years from ages 64 years through ages 70 or 61 years with a history of 3 consecutive normal Pap tests. Testing can be stopped between 65 and 70 years with 3 consecutive normal Pap tests and no abnormal Pap or HPV tests in the past 10 years.  Fecal occult blood test (FOBT) of stool. / Every year beginning at age 40 years and continuing until age 27 years. You may not need to do this test if you get a colonoscopy every 10 years.  Flexible sigmoidoscopy or colonoscopy.** / Every 5 years for a flexible sigmoidoscopy or every 10 years for a colonoscopy beginning at age 7 years and continuing until age 32 years.  Hepatitis C blood test.** / For all people born from [birth year] through 1964 and any individual with known risks for hepatitis C.  Osteoporosis screening.** / A one-time screening for women ages 30 years and over and women at risk for fractures or osteoporosis.  Skin self-exam. / Monthly.  Influenza vaccine. / Every year.  Tetanus, diphtheria, and acellular pertussis (Tdap/Td)  vaccine.** / 1 dose of Td every 10 years.  Varicella vaccine.** / Consult your health care provider.  Zoster vaccine.** / 1 dose for adults aged 35 years or older.  Pneumococcal 13-valent conjugate (PCV13) vaccine.** / Consult your health care provider.  Pneumococcal polysaccharide (PPSV23) vaccine.** / 1 dose for all adults aged 46 years and older.  Meningococcal vaccine.** / Consult your health care provider.  Hepatitis A vaccine.** / Consult your health care provider.  Hepatitis B vaccine.** / Consult your health care provider.  Haemophilus influenzae type b (Hib) vaccine.** / Consult your health care provider. ** Family history and personal history of risk and conditions may change your health care provider's recommendations.   This information is not intended to replace advice given to you by your health care provider. Make sure you discuss any questions you have with your health care provider.   Document Released: 09/14/2001 Document Revised: 08/09/2014 Document Reviewed: 12/14/2010 Elsevier Interactive Patient Education [employer].

## 2016-04-14 NOTE — Progress Notes (Signed)
Pre visit review using our clinic review tool, if applicable. No additional management support is needed unless otherwise documented below in the visit note. 

## 2016-04-14 NOTE — Progress Notes (Signed)
Subjective:  Patient ID: [name], female    DOB: 05-Sep-1945  Age: 70 y.o. MRN: [id number]  CC: Annual Exam; Hyperlipidemia; and Diabetes   HPI [name] presents for a CPX.  She tells me she thinks her blood pressure and blood sugars have been well controlled. She has felt well lately with only mild nausea related to her gastroparesis. She's had no recent episodes of polys, headache, blurred vision, chest pain, palpitations, shortness of breath, edema, or fatigue.  Outpatient Medications Prior to Visit  Medication Sig Dispense Refill  . canagliflozin (INVOKANA) 100 MG TABS tablet Take 1 tablet (100 mg total) by mouth daily before breakfast. 90 tablet 1  . Cholecalciferol 2000 units TABS Take 1 tablet (2,000 Units total) by mouth daily. 90 tablet 3  . cyanocobalamin 2000 MCG tablet Take 1 tablet (2,000 mcg total) by mouth daily. 90 tablet 3  . ibuprofen (ADVIL,MOTRIN) 200 MG tablet Take 400 mg by mouth every 6 (six) hours as needed for headache, mild pain or moderate pain. Reported on 08/07/2015    . LORazepam (ATIVAN) 1 MG tablet Take 1 tablet (1 mg total) by mouth every 8 (eight) hours as needed for anxiety. 12 tablet 0  . nebivolol (BYSTOLIC) 10 MG tablet Take 1 tablet (10 mg total) by mouth daily. 90 tablet 3  . pantoprazole (PROTONIX) 40 MG tablet TAKE 1 TABLET (40 MG TOTAL) BY MOUTH DAILY. REPORTED ON 08/07/2015 90 tablet 3  . sertraline (ZOLOFT) 100 MG tablet Take 100 mg by mouth daily.  12  . sitaGLIPtin (JANUVIA) 100 MG tablet Take 1 tablet (100 mg total) by mouth daily. 90 tablet 1  . tizanidine (ZANAFLEX) 2 MG capsule TAKE ONE CAPSULE BY MOUTH 3 TIMES A DAY 90 capsule 3  . traZODone (DESYREL) 50 MG tablet Reported on 08/07/2015  3  . BUTRANS 7.5 MCG/HR PTWK PLACE 1 PATCH ON SKIN ONCE A WEEK 4 patch 5  . SUMAtriptan-naproxen (TREXIMET) 85-500 MG tablet Take 1 tablet by mouth every 2 (two) hours as needed for migraine. 10 tablet 1   No facility-administered  medications prior to visit.     ROS Review of Systems  Constitutional: Negative.  Negative for activity change, appetite change, chills, diaphoresis, fatigue, fever and unexpected weight change.  HENT: Negative.  Negative for trouble swallowing.   Eyes: Negative.  Negative for visual disturbance.  Respiratory: Negative.  Negative for cough, choking, chest tightness, shortness of breath and stridor.   Cardiovascular: Negative.  Negative for chest pain, palpitations and leg swelling.  Gastrointestinal: Positive for nausea. Negative for abdominal distention, abdominal pain, anal bleeding, blood in stool, constipation, diarrhea, rectal pain and vomiting.  Endocrine: Negative.  Negative for polydipsia, polyphagia and polyuria.  Genitourinary: Negative.  Negative for decreased urine volume, difficulty urinating, dysuria, flank pain, frequency, hematuria, pelvic pain and urgency.  Musculoskeletal: Negative.  Negative for arthralgias, back pain, joint swelling, myalgias and neck pain.  Skin: Negative.  Negative for color change and rash.  Allergic/Immunologic: Negative.   Neurological: Negative.  Negative for dizziness, tremors, weakness, light-headedness, numbness and headaches.  Hematological: Negative.  Negative for adenopathy. Does not bruise/bleed easily.  Psychiatric/Behavioral: Negative.     Objective:  BP 128/82 (BP Location: Left Arm, Patient Position: Sitting, Cuff Size: Large)   Pulse 82   Temp 98.6 F (37 C) (Oral)   Resp 16   Ht 5\' 5"  (1.651 m)   Wt 234 lb (106.1 kg)   SpO2 98%   BMI 38.94  kg/m   BP Readings from Last 3 Encounters:  04/14/16 128/82  03/17/16 (!) 148/98  02/24/16 118/78    Wt Readings from Last 3 Encounters:  04/14/16 234 lb (106.1 kg)  03/17/16 234 lb (106.1 kg)  02/24/16 229 lb 8 oz (104.1 kg)    Physical Exam  Constitutional: She is oriented to person, place, and time. No distress.  HENT:  Mouth/Throat: Oropharynx is clear and moist. No  oropharyngeal exudate.  Eyes: Conjunctivae are normal. Right eye exhibits no discharge. Left eye exhibits no discharge. No scleral icterus.  Neck: Normal range of motion. Neck supple. No JVD present. No tracheal deviation present. No thyromegaly present.  Cardiovascular: Normal rate, regular rhythm, normal heart sounds and intact distal pulses.  Exam reveals no gallop and no friction rub.   No murmur heard. Pulmonary/Chest: Effort normal and breath sounds normal. No stridor. No respiratory distress. She has no wheezes. She has no rales. She exhibits no tenderness.  Abdominal: Soft. Bowel sounds are normal. She exhibits no distension and no mass. There is no tenderness. There is no rebound and no guarding.  Musculoskeletal: Normal range of motion. She exhibits no edema, tenderness or deformity.  Lymphadenopathy:    She has no cervical adenopathy.  Neurological: She is oriented to person, place, and time.  Skin: Skin is warm and dry. No rash noted. She is not diaphoretic. No erythema. No pallor.  Vitals reviewed.   Lab Results  Component Value Date   WBC 7.8 10/29/2015   HGB 15.3 (H) 10/29/2015   HCT 44.8 10/29/2015   PLT 193.0 10/29/2015   GLUCOSE 185 (H) 10/29/2015   CHOL 210 (H) 08/07/2015   TRIG 264.0 (H) 08/07/2015   HDL 53.40 08/07/2015   LDLDIRECT 105.0 08/07/2015   LDLCALC 90 05/22/2014   ALT 57 (H) 10/29/2015   AST 96 (H) 10/29/2015   NA 137 10/29/2015   K 4.7 10/29/2015   CL 100 10/29/2015   CREATININE 0.88 10/29/2015   BUN 13 10/29/2015   CO2 29 10/29/2015   TSH 2.33 08/07/2015   INR 1.33 07/25/2014   HGBA1C 7.9 01/21/2016    Dg Cervical Spine Complete  Result Date: 08/07/2015 CLINICAL DATA:  Left side neck pain, left shoulder pain, cervical radiculitis EXAM: CERVICAL SPINE - COMPLETE 4+ VIEW COMPARISON:  None. FINDINGS: Five views of cervical spine submitted. No acute fracture or subluxation. Mild disc space flattening with anterior spurring at C5-C6 and C6-C7  level. No prevertebral soft tissue swelling. Cervical airway is patent. Bilateral mild narrowing of neural foramina at C5-C6 level. IMPRESSION: No acute fracture or subluxation. Degenerative changes at C5-C6 and C6-C7 level. Electronically Signed   By: Lahoma Crocker M.D.   On: 08/07/2015 15:23    Assessment & Plan:   Moxie was seen today for annual exam, hyperlipidemia and diabetes.  Diagnoses and all orders for this visit:  Essential hypertension- her blood pressure is well-controlled, will monitor her electrolytes and renal function. -     Comprehensive metabolic panel; Future -     Urinalysis, Routine w reflex microscopic (not at [facility]); Future  Type 2 diabetes mellitus with complication, without long-term current use of insulin ([place])- she is due for an eye exam so I sent a referral, I will recheck her A1c and advise further if indicated, will also closely monitor for renal complications. -     Comprehensive metabolic panel; Future -     Hemoglobin A1c; Future -     Microalbumin / creatinine urine ratio;  Future -     Ambulatory referral to Ophthalmology  Encounter for screening mammogram for breast cancer -     MM DIGITAL SCREENING BILATERAL; Future  Estrogen deficiency -     DG Bone Density; Future  Vitamin B12 deficiency anemia -     CBC with Differential/Platelet; Future  Obesity (BMI 30-39.9)- she is committed to losing weight with lifestyle modifications.  Hyperlipidemia with target LDL less than 100- I will check her fasting lipid panel to see if she has achieved her LDL goal. -     Lipid panel; Future -     TSH; Future  Need for shingles vaccine -     Varicella-zoster vaccine subcutaneous  Need for prophylactic vaccination against Streptococcus pneumoniae (pneumococcus) -     Pneumococcal polysaccharide vaccine 23-valent greater than or equal to 2yo subcutaneous/IM   I have discontinued Ms. Hallberg's BUTRANS, SUMAtriptan-naproxen, and SUMAtriptan. I am also having  her maintain her ibuprofen, LORazepam, traZODone, Cholecalciferol, cyanocobalamin, pantoprazole, canagliflozin, sitaGLIPtin, sertraline, tizanidine, nebivolol, Fexofenadine HCl (MUCINEX ALLERGY PO), and MELATONIN ER PO.  Meds ordered this encounter  Medications  . Fexofenadine HCl (MUCINEX ALLERGY PO)    Sig: Take by mouth.  . DISCONTD: SUMAtriptan (IMITREX) 50 MG tablet    Sig: TAKE 1 TABLET BY MOUTH AS NEEDED FOR MIGRAINE, CAN REPEAT IN 2 HOURS IF HEADACHE PERSISTS    Refill:  1  . MELATONIN ER PO    Sig: Take by mouth.   See AVS for instructions about healthy living and anticipatory guidance.  Follow-up: Return in about 4 months (around 08/14/2016).  [name], MD

## 2016-04-15 IMAGING — MR MR 3D RECON AT SCANNER
19 of 21 series · 19 of 21 positions shown · IV contrast (multihance)
Comparison: 02/22/2014

CLINICAL DATA: Nausea and vomiting. Prior cholecystectomy in 0998.
Prior pancreatitis.

EXAM:
MRI ABDOMEN WITHOUT AND WITH CONTRAST (INCLUDING MRCP)
TECHNIQUE: Multiplanar multisequence MR imaging of the abdomen was performed
both before and after the administration of intravenous contrast.
Heavily T2-weighted images of the biliary and pancreatic ducts were
obtained, and three-dimensional MRCP images were rendered by post
processing.
CONTRAST:  20mL MULTIHANCE GADOBENATE DIMEGLUMINE 529 MG/ML IV SOLN

[Series 3: T2 fat-sat · axial · 5.0mm · 0.70mm/px · 1 of 37 slices shown]
[im 1/37]
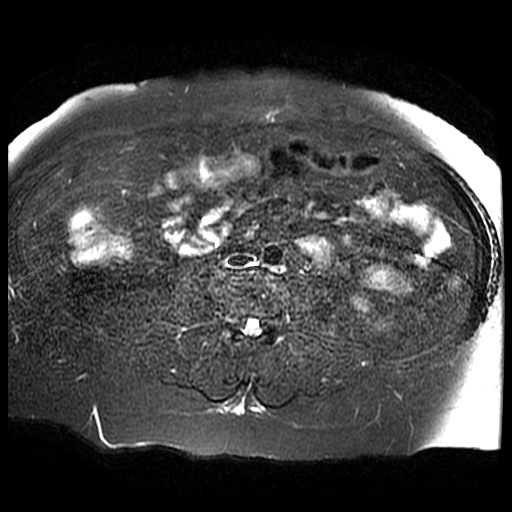

[Series 4: T2 · axial · 5.0mm · 0.70mm/px · 1 of 37 slices shown (1 of 2)]
[im 1/37]
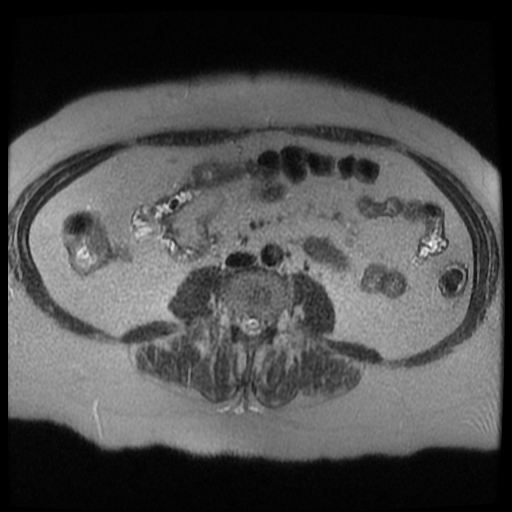

[Series 5: T2 · coronal · 5.0mm · 0.82mm/px · 1 of 37 slices shown (2 of 2)]
[im 1/37]
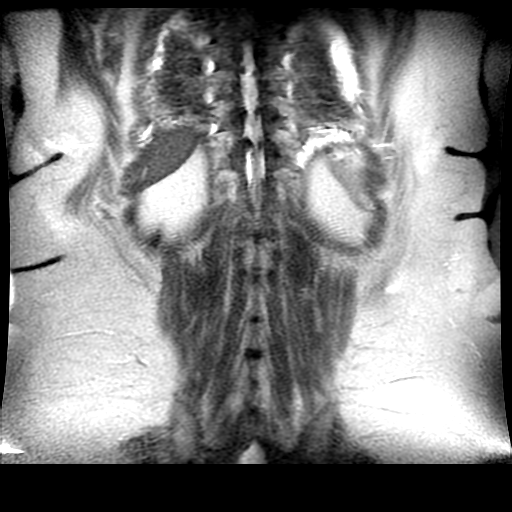

[Series 6: MRCP · coronal · 1.6mm · 0.62mm/px · 1 of 96 slices shown (1 of 3)]
[im 1/96]
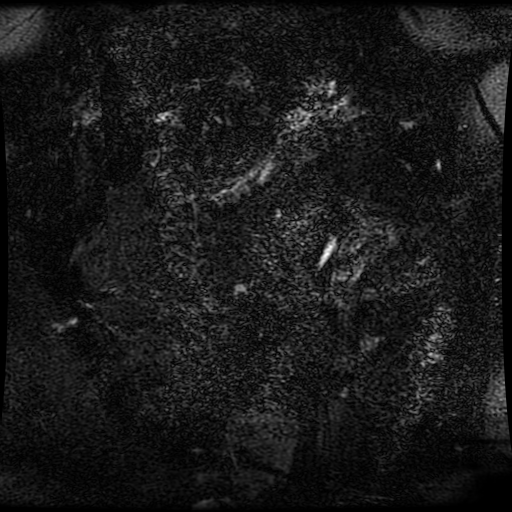

[Series 7: MRCP · coronal · 4.0mm · 0.70mm/px · 1 of 16 slices shown (2 of 3)]
[im 1/16]
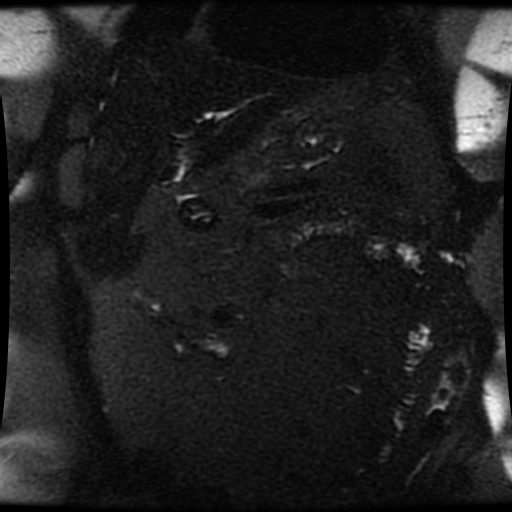

[Series 11: ax dualecho · axial · 5.0mm · 0.78mm/px · 1 of 78 slices shown]
[im 1/78]
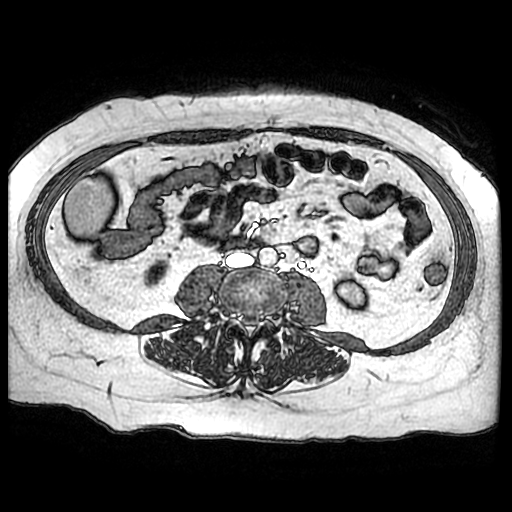

[Series 13: MRCP · oblique · 40.0mm · 0.70mm/px · 1 of 5 slices shown (3 of 3)]
[im 1/5]
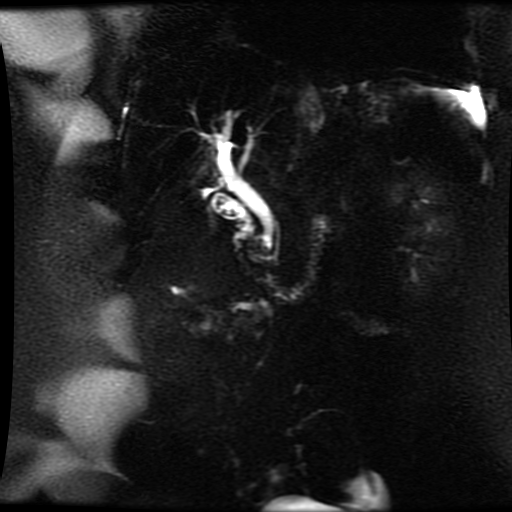

[Series 16: T1 dynamic post-contrast · coronal · 5.0mm · 0.82mm/px · 1 of 72 slices shown]
[im 1/72]
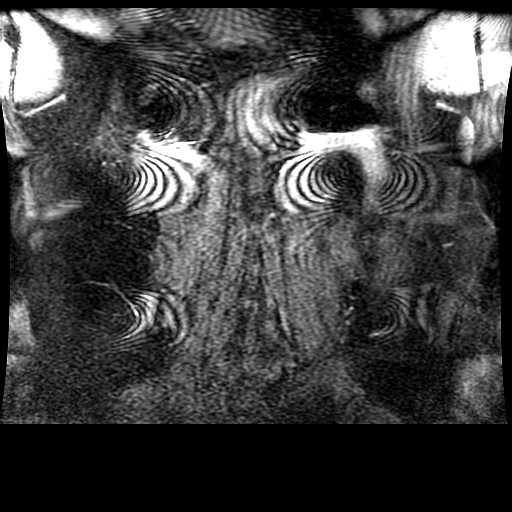

[Series 602: processed images · axial · 1.6mm · 0.49mm/px · 1 of 1 slices shown]
[im 1/1]
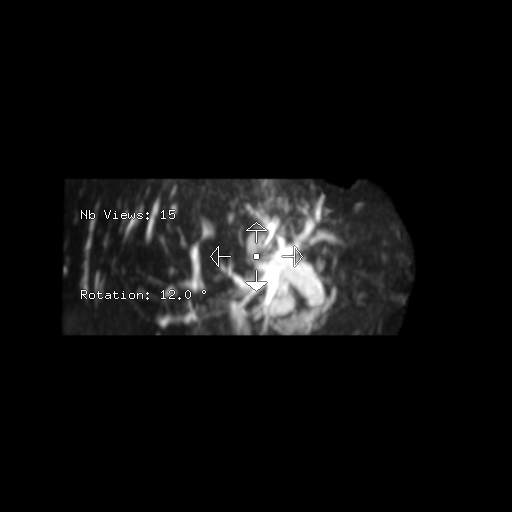

[Series 800: DWI · axial · 6.0mm · 1.48mm/px · 1 of 24 slices shown]
[im 1/24]
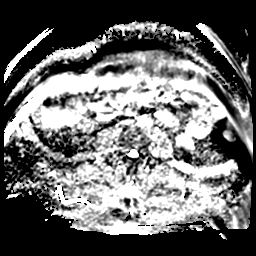

[Series 1500: T1 dynamic · axial · 5.0mm · 0.78mm/px · 1 of 88 slices shown (1 of 4)]
[im 1/88]
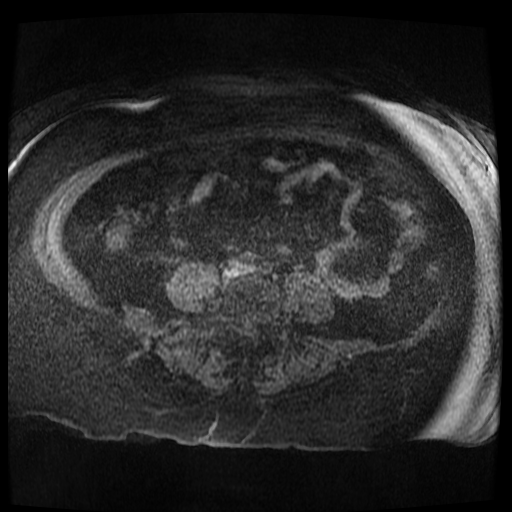

[Series 1501: T1 dynamic · axial · 5.0mm · 0.78mm/px · 1 of 88 slices shown (2 of 4)]
[im 1/88]
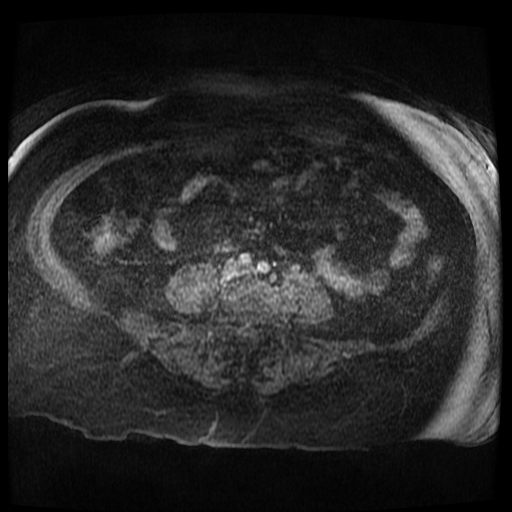

[Series 1502: T1 dynamic · axial · 5.0mm · 0.78mm/px · 1 of 88 slices shown (3 of 4)]
[im 1/88]
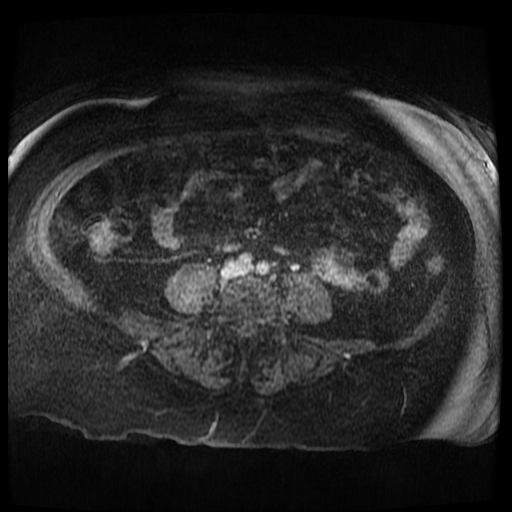

[Series 1503: T1 dynamic · axial · 5.0mm · 0.78mm/px · 1 of 88 slices shown (4 of 4)]
[im 1/88]
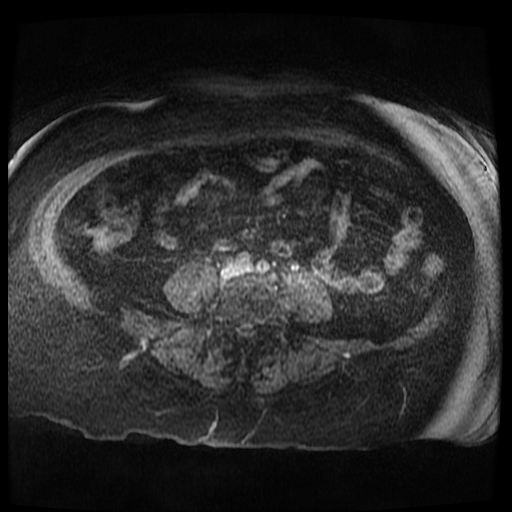

[((id)/(id)/1)-((id)/(id)/1) · axial · 5.0mm · 0.78mm/px · 1 of 88 slices shown (1 of 5)]
[im 1/88]
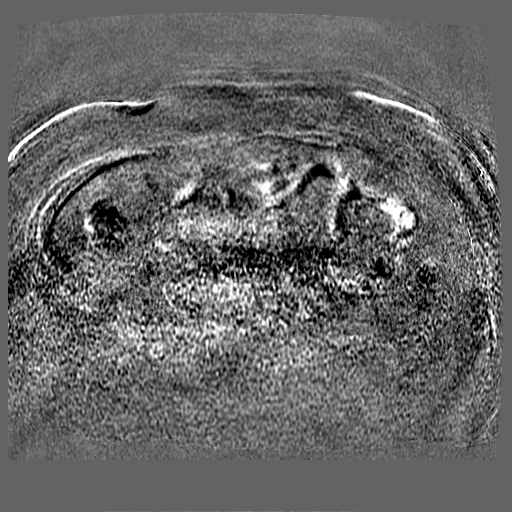

[((id)/(id)/1)-((id)/(id)/1) · axial · 5.0mm · 0.78mm/px · 1 of 88 slices shown (2 of 5)]
[im 1/88]
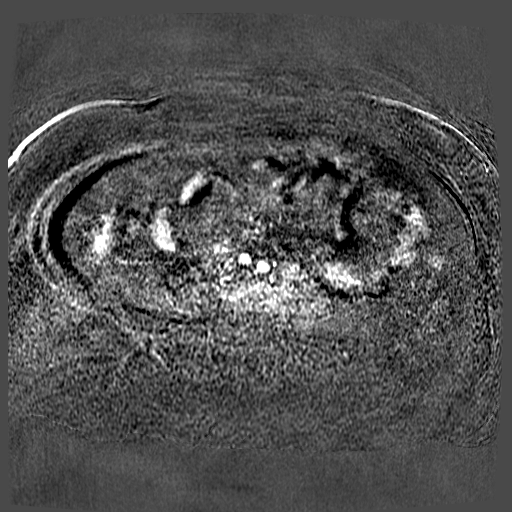

[((id)/(id)/1)-((id)/(id)/1) · axial · 5.0mm · 0.78mm/px · 1 of 88 slices shown (3 of 5)]
[im 1/88]
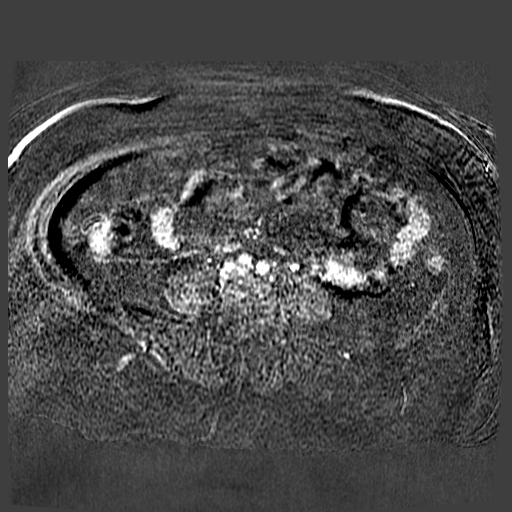

[((id)/(id)/1)-((id)/(id)/1) · axial · 5.0mm · 0.78mm/px · 1 of 88 slices shown (4 of 5)]
[im 1/88]
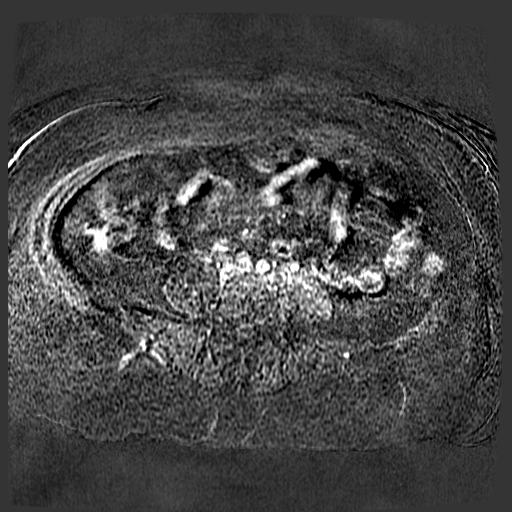

[((id)/(id)/1)-((id)/(id)/1) · axial · 5.0mm · 0.78mm/px · 1 of 88 slices shown (5 of 5)]
[im 1/88]
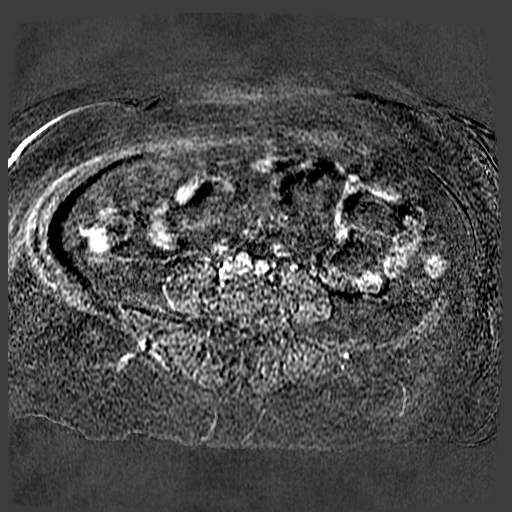

[19 of 21 positions shown; findings below may reference images not displayed]

FINDINGS: Trace left pleural effusion. Common bile duct dilatation to 11 mm
with a 6 mm filling defect in the distal CBD and potentially and
adjacent flat 0.5 x 0.2 cm filling defect also in the distal CBD.

Accessory left hepatic duct noted primarily serving the caudate
lobe.

3 cm periampullary duodenal diverticulum. 1.6 cm cyst, right kidney
lower pole. Spleen unremarkable.

Clustered borderline sized porta hepatis lymph nodes. 6.5 x 3.4 cm
supraumbilical ventral hernia contains omental adipose tissue. Trace
free fluid along the gallbladder fossa.

No specific pancreatic abnormality identified.
IMPRESSION: 1. 6 mm filling defect in the distal common bile duct associated
with mild CBD dilatation. Appearance favors distal CBD stone. I do
not see definite enhancement to suggest that this is a mass. There
is an adjacent duodenal diverticulum.
2. The caudate lobe bile duct branch attach is to the common hepatic
duct below the level of the confluence of the right and left hepatic
ducts.
3. Ventral hernia contains omental adipose tissue.
4. Trace bilateral pleural effusions with bibasilar mild
atelectasis. Trace edema tracks along the porta hepatis, etiology
uncertain.

## 2016-04-15 NOTE — Assessment & Plan Note (Signed)

## 2016-04-21 ENCOUNTER — Ambulatory Visit: Payer: Self-pay | Admitting: Internal Medicine

## 2016-05-04 ENCOUNTER — Other Ambulatory Visit: Payer: Self-pay | Admitting: Internal Medicine

## 2016-05-04 ENCOUNTER — Telehealth: Payer: Self-pay

## 2016-05-04 MED ORDER — TIZANIDINE HCL 2 MG PO CAPS: 2.0000 mg | ORAL_CAPSULE | Freq: Three times a day (TID) | ORAL | 3 refills | Status: DC

## 2016-05-04 MED ORDER — TIZANIDINE HCL 2 MG PO CAPS: 2.0000 mg | ORAL_CAPSULE | Freq: Three times a day (TID) | ORAL | 1 refills | Status: DC

## 2016-05-04 NOTE — Telephone Encounter (Signed)
OV on 09/13/20217 - no labs were drawn. Questioning if they need to be completed before the rf.   [facility] day supply of tizanidine.  Refill sent on 03/11/2016 for 30 day supply. Contacted pharmacy. Last fill was 05/03/2016 with 1 refill left.  Please adviseti

## 2016-05-04 NOTE — Telephone Encounter (Signed)
Prescription sent. Labs that were ordered need to be completed.

## 2016-05-04 NOTE — Telephone Encounter (Signed)
LVM for pt to call back as soon as possible.   RE: Pt needs to have labs done!

## 2016-05-05 NOTE — Telephone Encounter (Signed)
Pt informed. Pt stated she will come in on Thursday of next week.  Added reminder to my items.

## 2016-05-11 ENCOUNTER — Other Ambulatory Visit (INDEPENDENT_AMBULATORY_CARE_PROVIDER_SITE_OTHER): Payer: Medicare Other

## 2016-05-11 ENCOUNTER — Encounter: Payer: Self-pay | Admitting: Internal Medicine

## 2016-05-11 DIAGNOSIS — E118 Type 2 diabetes mellitus with unspecified complications: Secondary | ICD-10-CM

## 2016-05-11 DIAGNOSIS — D519 Vitamin B12 deficiency anemia, unspecified: Secondary | ICD-10-CM | POA: Diagnosis not present

## 2016-05-11 DIAGNOSIS — E785 Hyperlipidemia, unspecified: Secondary | ICD-10-CM | POA: Diagnosis not present

## 2016-05-11 DIAGNOSIS — I10 Essential (primary) hypertension: Secondary | ICD-10-CM

## 2016-05-11 LAB — CBC WITH DIFFERENTIAL/PLATELET
Basophils Absolute: 0 10*3/uL (ref 0.0–0.1)
Basophils Relative: 0.6 % (ref 0.0–3.0)
Eosinophils Absolute: 0.4 10*3/uL (ref 0.0–0.7)
Eosinophils Relative: 4.3 % (ref 0.0–5.0)
HCT: 45 % (ref 36.0–46.0)
Hemoglobin: 15.4 g/dL — ABNORMAL HIGH (ref 12.0–15.0)
Lymphocytes Relative: 18.8 % (ref 12.0–46.0)
Lymphs Abs: 1.6 10*3/uL (ref 0.7–4.0)
MCHC: 34.2 g/dL (ref 30.0–36.0)
MCV: 90.6 fl (ref 78.0–100.0)
Monocytes Absolute: 0.5 10*3/uL (ref 0.1–1.0)
Monocytes Relative: 6.1 % (ref 3.0–12.0)
Neutro Abs: 5.9 10*3/uL (ref 1.4–7.7)
Neutrophils Relative %: 70.2 % (ref 43.0–77.0)
Platelets: 261 10*3/uL (ref 150.0–400.0)
RBC: 4.96 Mil/uL (ref 3.87–5.11)
RDW: 13.1 % (ref 11.5–15.5)
WBC: 8.4 10*3/uL (ref 4.0–10.5)

## 2016-05-11 LAB — URINALYSIS, ROUTINE W REFLEX MICROSCOPIC
Bilirubin Urine: NEGATIVE
Hgb urine dipstick: NEGATIVE
Ketones, ur: NEGATIVE
Nitrite: NEGATIVE
RBC / HPF: NONE SEEN (ref 0–?)
Specific Gravity, Urine: 1.025 (ref 1.000–1.030)
Total Protein, Urine: NEGATIVE
Urine Glucose: >=1000 — AB
Urobilinogen, UA: 0.2 (ref 0.0–1.0)
pH: 5.5 (ref 5.0–8.0)

## 2016-05-11 LAB — COMPREHENSIVE METABOLIC PANEL
ALT: 14 U/L (ref 0–35)
AST: 15 U/L (ref 0–37)
Albumin: 3.9 g/dL (ref 3.5–5.2)
Alkaline Phosphatase: 68 U/L (ref 39–117)
BUN: 16 mg/dL (ref 6–23)
CO2: 28 mEq/L (ref 19–32)
Calcium: 9.9 mg/dL (ref 8.4–10.5)
Chloride: 102 mEq/L (ref 96–112)
Creatinine, Ser: 0.89 mg/dL (ref 0.40–1.20)
GFR: 66.79 mL/min (ref >60.00)
Glucose, Bld: 164 mg/dL — ABNORMAL HIGH (ref 70–99)
Potassium: 4.5 mEq/L (ref 3.5–5.1)
Sodium: 139 mEq/L (ref 135–145)
Total Bilirubin: 0.5 mg/dL (ref 0.2–1.2)
Total Protein: 8 g/dL (ref 6.0–8.3)

## 2016-05-11 LAB — MICROALBUMIN / CREATININE URINE RATIO
Creatinine,U: 121.2 mg/dL
Microalb Creat Ratio: 0.6 mg/g (ref 0.0–30.0)
Microalb, Ur: <0.7 mg/dL (ref 0.0–1.9)

## 2016-05-11 LAB — LIPID PANEL
Cholesterol: 225 mg/dL — ABNORMAL HIGH (ref 0–200)
HDL: 61 mg/dL (ref >39.00)
LDL Cholesterol: 124 mg/dL — ABNORMAL HIGH (ref 0–99)
NonHDL: 164.02
Total CHOL/HDL Ratio: 4
Triglycerides: 198 mg/dL — ABNORMAL HIGH (ref 0.0–149.0)
VLDL: 39.6 mg/dL (ref 0.0–40.0)

## 2016-05-11 LAB — HEMOGLOBIN A1C: Hgb A1c MFr Bld: 6.8 % — ABNORMAL HIGH (ref 4.6–6.5)

## 2016-05-11 LAB — TSH: TSH: 2 u[IU]/mL (ref 0.35–4.50)

## 2016-05-12 ENCOUNTER — Other Ambulatory Visit: Payer: Self-pay | Admitting: Internal Medicine

## 2016-05-12 DIAGNOSIS — G43001 Migraine without aura, not intractable, with status migrainosus: Secondary | ICD-10-CM

## 2016-05-13 LAB — HM DIABETES EYE EXAM

## 2016-06-01 ENCOUNTER — Telehealth: Payer: Self-pay | Admitting: *Deleted

## 2016-06-01 DIAGNOSIS — I5189 Other ill-defined heart diseases: Secondary | ICD-10-CM

## 2016-06-01 DIAGNOSIS — I10 Essential (primary) hypertension: Secondary | ICD-10-CM

## 2016-06-01 DIAGNOSIS — G43001 Migraine without aura, not intractable, with status migrainosus: Secondary | ICD-10-CM

## 2016-06-01 DIAGNOSIS — I519 Heart disease, unspecified: Secondary | ICD-10-CM

## 2016-06-01 MED ORDER — NEBIVOLOL HCL 10 MG PO TABS: 10.0000 mg | ORAL_TABLET | Freq: Every day | ORAL | 1 refills | Status: DC

## 2016-06-01 NOTE — Telephone Encounter (Signed)
Rec'd call pt is needing rx [facility] to [facility] on the [address] inform pt sending electronically...[name]

## 2016-07-07 ENCOUNTER — Ambulatory Visit (INDEPENDENT_AMBULATORY_CARE_PROVIDER_SITE_OTHER)
Admission: RE | Admit: 2016-07-07 | Discharge: 2016-07-07 | Disposition: A | Discharge status: Home / Self Care | Payer: Medicare Other | Source: Ambulatory Visit | Attending: Internal Medicine | Admitting: Internal Medicine

## 2016-07-07 ENCOUNTER — Telehealth: Payer: Self-pay | Admitting: Emergency Medicine

## 2016-07-07 ENCOUNTER — Ambulatory Visit (INDEPENDENT_AMBULATORY_CARE_PROVIDER_SITE_OTHER): Payer: Medicare Other | Admitting: Internal Medicine

## 2016-07-07 ENCOUNTER — Encounter: Payer: Self-pay | Admitting: Internal Medicine

## 2016-07-07 VITALS — BP 140/84 | HR 67 | Temp 98.3°F | Ht 65.0 in | Wt 244.0 lb

## 2016-07-07 DIAGNOSIS — R6884 Jaw pain: Secondary | ICD-10-CM

## 2016-07-07 DIAGNOSIS — G43001 Migraine without aura, not intractable, with status migrainosus: Secondary | ICD-10-CM | POA: Diagnosis not present

## 2016-07-07 MED ORDER — ISOMETHEPTENE-DICHLORAL-APAP 65-100-325 MG PO CAPS: 1.0000 | ORAL_CAPSULE | Freq: Four times a day (QID) | ORAL | 0 refills | Status: DC | PRN

## 2016-07-07 NOTE — Telephone Encounter (Signed)
Pt called and stated she was in earlier and saw Dr. Ronnald Ramp. She is wondering if there is something that can be called in for her mouth and throat. Please advise thanks.

## 2016-07-07 NOTE — Progress Notes (Signed)
Subjective:  Patient ID: [name], female    DOB: 28-Apr-1946  Age: 70 y.o. MRN: [id number]  CC: Jaw Pain   HPI [name] presents for Right jaw pain for about 3 weeks. She tells me that she saw an oral surgeon about 3 weeks ago and had a molar tooth pulled and hias had persistent discomfort in the area. She tells me she has been back to see the oral surgeon and they told her that there is nothing wrong with the area. She is concerned that she might have cancer on the right side of her neck are around her jaw. She says the pain occurs whenever she chews or swallows.  She also complains that Imitrex does not help much for migraine headaches and she wants to try something different.  Outpatient Medications Prior to Visit  Medication Sig Dispense Refill  . canagliflozin (INVOKANA) 100 MG TABS tablet Take 1 tablet (100 mg total) by mouth daily before breakfast. 90 tablet 1  . Cholecalciferol 2000 units TABS Take 1 tablet (2,000 Units total) by mouth daily. 90 tablet 3  . cyanocobalamin 2000 MCG tablet Take 1 tablet (2,000 mcg total) by mouth daily. 90 tablet 3  . Fexofenadine HCl (MUCINEX ALLERGY PO) Take by mouth.    . MELATONIN ER PO Take by mouth.    . nebivolol (BYSTOLIC) 10 MG tablet Take 1 tablet (10 mg total) by mouth daily. 90 tablet 1  . pantoprazole (PROTONIX) 40 MG tablet TAKE 1 TABLET (40 MG TOTAL) BY MOUTH DAILY. REPORTED ON 08/07/2015 90 tablet 3  . sertraline (ZOLOFT) 100 MG tablet Take 100 mg by mouth daily.  12  . sitaGLIPtin (JANUVIA) 100 MG tablet Take 1 tablet (100 mg total) by mouth daily. 90 tablet 1  . tizanidine (ZANAFLEX) 2 MG capsule Take 1 capsule (2 mg total) by mouth 3 (three) times daily. 270 capsule 1  . traZODone (DESYREL) 50 MG tablet Reported on 08/07/2015  3  . ibuprofen (ADVIL,MOTRIN) 200 MG tablet Take 400 mg by mouth every 6 (six) hours as needed for headache, mild pain or moderate pain. Reported on 08/07/2015    . LORazepam (ATIVAN) 1 MG  tablet Take 1 tablet (1 mg total) by mouth every 8 (eight) hours as needed for anxiety. 12 tablet 0  . SUMAtriptan (IMITREX) 50 MG tablet TAKE 1 TABLET BY MOUTH AS NEEDED FOR MIGRAINE, CAN REPEAT IN 2 HOURS IF HEADACHE PERSISTS 10 tablet 5   No facility-administered medications prior to visit.     ROS Review of Systems  Constitutional: Negative for chills, fatigue and fever.  HENT: Negative for facial swelling, mouth sores, sinus pain, sinus pressure, sore throat, trouble swallowing and voice change.   Respiratory: Negative for cough, shortness of breath and wheezing.   Cardiovascular: Negative for chest pain, palpitations and leg swelling.  Gastrointestinal: Negative for abdominal pain, diarrhea, nausea and vomiting.  Endocrine: Negative.   Genitourinary: Negative.   Musculoskeletal: Negative.   Allergic/Immunologic: Negative.   Neurological: Negative.   Hematological: Negative for adenopathy. Does not bruise/bleed easily.  Psychiatric/Behavioral: Negative.     Objective:  BP 140/84 (BP Location: Left Arm, Patient Position: Sitting, Cuff Size: Large)   Pulse 67   Temp 98.3 F (36.8 C) (Oral)   Ht 5\' 5"  (1.651 m)   Wt 244 lb (110.7 kg)   SpO2 97%   BMI 40.60 kg/m   BP Readings from Last 3 Encounters:  07/07/16 140/84  04/14/16 128/82  03/17/16 Marland Kitchen)  148/98    Wt Readings from Last 3 Encounters:  07/07/16 244 lb (110.7 kg)  04/14/16 234 lb (106.1 kg)  03/17/16 234 lb (106.1 kg)    Physical Exam  Constitutional: She is oriented to person, place, and time. No distress.  HENT:  Mouth/Throat: Oropharynx is clear and moist. No oral lesions. Normal dentition. No dental abscesses or uvula swelling. No oropharyngeal exudate, posterior oropharyngeal edema, posterior oropharyngeal erythema or tonsillar abscesses.  There is no tenderness to palpation along the floor the mouth or over the palpable surfaces of the mandible  Eyes: Conjunctivae are normal. Right eye exhibits no  discharge. Left eye exhibits no discharge. No scleral icterus.  Neck: Normal range of motion. Neck supple. No JVD present. No tracheal deviation present. No thyroid mass and no thyromegaly present.    Cardiovascular: Normal rate, regular rhythm, normal heart sounds and intact distal pulses.  Exam reveals no gallop and no friction rub.   No murmur heard. Pulmonary/Chest: Effort normal. No stridor. No respiratory distress. She has no wheezes. She has no rales. She exhibits no tenderness.  Abdominal: Soft. Bowel sounds are normal. She exhibits no distension and no mass. There is no tenderness. There is no rebound and no guarding.  Musculoskeletal: Normal range of motion. She exhibits no edema, tenderness or deformity.  Lymphadenopathy:       Head (right side): No submental, no submandibular, no tonsillar, no preauricular, no posterior auricular and no occipital adenopathy present.       Head (left side): No submental, no submandibular, no tonsillar, no preauricular, no posterior auricular and no occipital adenopathy present.    She has no cervical adenopathy.       Right cervical: No deep cervical adenopathy present.      Left cervical: No deep cervical adenopathy present.    She has no axillary adenopathy.       Right: No supraclavicular adenopathy present.       Left: No supraclavicular adenopathy present.  Neurological: She is oriented to person, place, and time.  Skin: Skin is warm and dry. No rash noted. She is not diaphoretic. No erythema. No pallor.  Vitals reviewed.   Lab Results  Component Value Date   WBC 8.4 05/11/2016   HGB 15.4 (H) 05/11/2016   HCT 45.0 05/11/2016   PLT 261.0 05/11/2016   GLUCOSE 164 (H) 05/11/2016   CHOL 225 (H) 05/11/2016   TRIG 198.0 (H) 05/11/2016   HDL 61.00 05/11/2016   LDLDIRECT 105.0 08/07/2015   LDLCALC 124 (H) 05/11/2016   ALT 14 05/11/2016   AST 15 05/11/2016   NA 139 05/11/2016   K 4.5 05/11/2016   CL 102 05/11/2016   CREATININE 0.89  05/11/2016   BUN 16 05/11/2016   CO2 28 05/11/2016   TSH 2.00 05/11/2016   INR 1.33 07/25/2014   HGBA1C 6.8 (H) 05/11/2016   MICROALBUR <0.7 05/11/2016    Dg Cervical Spine Complete  Result Date: 08/07/2015 CLINICAL DATA:  Left side neck pain, left shoulder pain, cervical radiculitis EXAM: CERVICAL SPINE - COMPLETE 4+ VIEW COMPARISON:  None. FINDINGS: Five views of cervical spine submitted. No acute fracture or subluxation. Mild disc space flattening with anterior spurring at C5-C6 and C6-C7 level. No prevertebral soft tissue swelling. Cervical airway is patent. Bilateral mild narrowing of neural foramina at C5-C6 level. IMPRESSION: No acute fracture or subluxation. Degenerative changes at C5-C6 and C6-C7 level. Electronically Signed   By: Lahoma Crocker M.D.   On: 08/07/2015 15:23  Assessment & Plan:   Shahad was seen today for jaw pain.  Diagnoses and all orders for this visit:  Migraine without aura and with status migrainosus, not intractable -     isometheptene-acetaminophen-dichloralphenazone (MIDRIN) 65-100-325 MG capsule; Take 1 capsule by mouth 4 (four) times daily as needed for migraine. Maximum 5 capsules in 12 hours for migraine headaches, 8 capsules in 24 hours for tension headaches.  Pain in lower jaw- examination of the area and plain films are normal, I've ordered a CT of the soft tissue with contrast to see if there is concern for lymphadenopathy, mass, or malignancy. -     DG Mandible 4 Views; Future -     CT Soft Tissue Neck W Contrast; Future   I have discontinued Ms. Bonenberger's ibuprofen, LORazepam, and SUMAtriptan. I am also having her start on isometheptene-acetaminophen-dichloralphenazone. Additionally, I am having her maintain her traZODone, Cholecalciferol, cyanocobalamin, pantoprazole, canagliflozin, sitaGLIPtin, sertraline, Fexofenadine HCl (MUCINEX ALLERGY PO), MELATONIN ER PO, tizanidine, and nebivolol.  Meds ordered this encounter  Medications  .  isometheptene-acetaminophen-dichloralphenazone (MIDRIN) 65-100-325 MG capsule    Sig: Take 1 capsule by mouth 4 (four) times daily as needed for migraine. Maximum 5 capsules in 12 hours for migraine headaches, 8 capsules in 24 hours for tension headaches.    Dispense:  30 capsule    Refill:  0     Follow-up: Return in about 3 weeks (around 07/28/2016).  [name], MD

## 2016-07-07 NOTE — Progress Notes (Signed)
Pre visit review using our clinic review tool, if applicable. No additional management support is needed unless otherwise documented below in the visit note. 

## 2016-07-07 NOTE — Telephone Encounter (Signed)
Pt rq rx for mouth and throat pain. Please advise

## 2016-07-07 NOTE — Patient Instructions (Signed)
Migraine Headache A migraine headache is an intense, throbbing pain on one side or both sides of the head. Migraines may also cause other symptoms, such as nausea, vomiting, and sensitivity to light and noise. What are the causes? Doing or taking certain things may also trigger migraines, such as:  Alcohol.  Smoking.  Medicines, such as: ? Medicine used to treat chest pain (nitroglycerine). ? Birth control pills. ? Estrogen pills. ? Certain blood pressure medicines.  Aged cheeses, chocolate, or caffeine.  Foods or drinks that contain nitrates, glutamate, aspartame, or tyramine.  Physical activity.  Other things that may trigger a migraine include:  Menstruation.  Pregnancy.  Hunger.  Stress, lack of sleep, too much sleep, or fatigue.  Weather changes.  What increases the risk? The following factors may make you more likely to experience migraine headaches:  Age. Risk increases with age.  Family history of migraine headaches.  Being Caucasian.  Depression and anxiety.  Obesity.  Being a woman.  Having a hole in the heart (patent foramen ovale) or other heart problems.  What are the signs or symptoms? The main symptom of this condition is pulsating or throbbing pain. Pain may:  Happen in any area of the head, such as on one side or both sides.  Interfere with daily activities.  Get worse with physical activity.  Get worse with exposure to bright lights or loud noises.  Other symptoms may include:  Nausea.  Vomiting.  Dizziness.  General sensitivity to bright lights, loud noises, or smells.  Before you get a migraine, you may get warning signs that a migraine is developing (aura). An aura may include:  Seeing flashing lights or having blind spots.  Seeing bright spots, halos, or zigzag lines.  Having tunnel vision or blurred vision.  Having numbness or a tingling feeling.  Having trouble talking.  Having muscle weakness.  How is this  diagnosed? A migraine headache can be diagnosed based on:  Your symptoms.  A physical exam.  Tests, such as CT scan or MRI of the head. These imaging tests can help rule out other causes of headaches.  Taking fluid from the spine (lumbar puncture) and analyzing it (cerebrospinal fluid analysis, or CSF analysis).  How is this treated? A migraine headache is usually treated with medicines that:  Relieve pain.  Relieve nausea.  Prevent migraines from coming back.  Treatment may also include:  Acupuncture.  Lifestyle changes like avoiding foods that trigger migraines.  Follow these instructions at home: Medicines  Take over-the-counter and prescription medicines only as told by your health care provider.  Do not drive or use heavy machinery while taking prescription pain medicine.  To prevent or treat constipation while you are taking prescription pain medicine, your health care provider may recommend that you: ? Drink enough fluid to keep your urine clear or pale yellow. ? Take over-the-counter or prescription medicines. ? Eat foods that are high in fiber, such as fresh fruits and vegetables, whole grains, and beans. ? Limit foods that are high in fat and processed sugars, such as fried and sweet foods. Lifestyle  Avoid alcohol use.  Do not use any products that contain nicotine or tobacco, such as cigarettes and e-cigarettes. If you need help quitting, ask your health care provider.  Get at least 8 hours of sleep every night.  Limit your stress. General instructions   Keep a journal to find out what may trigger your migraine headaches. For example, write down: ? What you eat and   drink. ? How much sleep you get. ? Any change to your diet or medicines.  If you have a migraine: ? Avoid things that make your symptoms worse, such as bright lights. ? It may help to lie down in a dark, quiet room. ? Do not drive or use heavy machinery. ? Ask your health care provider  what activities are safe for you while you are experiencing symptoms.  Keep all follow-up visits as told by your health care provider. This is important. Contact a health care provider if:  You develop symptoms that are different or more severe than your usual migraine symptoms. Get help right away if:  Your migraine becomes severe.  You have a fever.  You have a stiff neck.  You have vision loss.  Your muscles feel weak or like you cannot control them.  You start to lose your balance often.  You develop trouble walking.  You faint. This information is not intended to replace advice given to you by your health care provider. Make sure you discuss any questions you have with your health care provider. Document Released: 07/19/2005 Document Revised: 02/06/2016 Document Reviewed: 01/05/2016 Elsevier Interactive Patient Education  2017 [employer]   

## 2016-07-14 ENCOUNTER — Telehealth: Payer: Self-pay

## 2016-07-14 NOTE — Telephone Encounter (Signed)
Plain xray was normal I am waiting for the CT results

## 2016-07-14 NOTE — Telephone Encounter (Signed)
Do you have results for pt xray?

## 2016-07-15 NOTE — Telephone Encounter (Signed)
Pt informed of xray results.  s

## 2016-07-18 ENCOUNTER — Other Ambulatory Visit: Payer: Self-pay | Admitting: Internal Medicine

## 2016-07-18 DIAGNOSIS — E118 Type 2 diabetes mellitus with unspecified complications: Secondary | ICD-10-CM

## 2016-07-21 ENCOUNTER — Other Ambulatory Visit: Payer: Self-pay

## 2016-07-30 ENCOUNTER — Other Ambulatory Visit: Payer: Self-pay | Admitting: Internal Medicine

## 2016-07-30 DIAGNOSIS — E118 Type 2 diabetes mellitus with unspecified complications: Secondary | ICD-10-CM

## 2016-08-03 ENCOUNTER — Ambulatory Visit (INDEPENDENT_AMBULATORY_CARE_PROVIDER_SITE_OTHER): Payer: Medicare Other | Admitting: Internal Medicine

## 2016-08-03 ENCOUNTER — Encounter: Payer: Self-pay | Admitting: Internal Medicine

## 2016-08-03 VITALS — BP 180/100 | HR 80 | Temp 98.5°F | Resp 16 | Ht 65.0 in | Wt 246.5 lb

## 2016-08-03 DIAGNOSIS — I519 Heart disease, unspecified: Secondary | ICD-10-CM | POA: Diagnosis not present

## 2016-08-03 DIAGNOSIS — I10 Essential (primary) hypertension: Secondary | ICD-10-CM

## 2016-08-03 DIAGNOSIS — I5189 Other ill-defined heart diseases: Secondary | ICD-10-CM

## 2016-08-03 MED ORDER — TELMISARTAN-HCTZ 80-12.5 MG PO TABS: 1.0000 | ORAL_TABLET | Freq: Every day | ORAL | 1 refills | Status: DC

## 2016-08-03 NOTE — Progress Notes (Signed)
Pre visit review using our clinic review tool, if applicable. No additional management support is needed unless otherwise documented below in the visit note. 

## 2016-08-03 NOTE — Patient Instructions (Signed)
Hypertension Hypertension, commonly called high blood pressure, is when the force of blood pumping through your arteries is too strong. Your arteries are the blood vessels that carry blood from your heart throughout your body. A blood pressure reading consists of a higher number over a lower number, such as 110/72. The higher number (systolic) is the pressure inside your arteries when your heart pumps. The lower number (diastolic) is the pressure inside your arteries when your heart relaxes. Ideally you want your blood pressure below 120/80. Hypertension forces your heart to work harder to pump blood. Your arteries may become narrow or stiff. Having untreated or uncontrolled hypertension can cause heart attack, stroke, kidney disease, and other problems. What increases the risk? Some risk factors for high blood pressure are controllable. Others are not. Risk factors you cannot control include:  Race. You may be at higher risk if you are African American.  Age. Risk increases with age.  Gender. Men are at higher risk than women before age 45 years. After age 65, women are at higher risk than men. Risk factors you can control include:  Not getting enough exercise or physical activity.  Being overweight.  Getting too much fat, sugar, calories, or salt in your diet.  Drinking too much alcohol. What are the signs or symptoms? Hypertension does not usually cause signs or symptoms. Extremely high blood pressure (hypertensive crisis) may cause headache, anxiety, shortness of breath, and nosebleed. How is this diagnosed? To check if you have hypertension, your health care provider will measure your blood pressure while you are seated, with your arm held at the level of your heart. It should be measured at least twice using the same arm. Certain conditions can cause a difference in blood pressure between your right and left arms. A blood pressure reading that is higher than normal on one occasion does  not mean that you need treatment. If it is not clear whether you have high blood pressure, you may be asked to return on a different day to have your blood pressure checked again. Or, you may be asked to monitor your blood pressure at home for 1 or more weeks. How is this treated? Treating high blood pressure includes making lifestyle changes and possibly taking medicine. Living a healthy lifestyle can help lower high blood pressure. You may need to change some of your habits. Lifestyle changes may include:  Following the DASH diet. This diet is high in fruits, vegetables, and whole grains. It is low in salt, red meat, and added sugars.  Keep your sodium intake below 2,300 mg per day.  Getting at least 30-45 minutes of aerobic exercise at least 4 times per week.  Losing weight if necessary.  Not smoking.  Limiting alcoholic beverages.  Learning ways to reduce stress. Your health care provider may prescribe medicine if lifestyle changes are not enough to get your blood pressure under control, and if one of the following is true:  You are 18-59 years of age and your systolic blood pressure is above 140.  You are 60 years of age or older, and your systolic blood pressure is above 150.  Your diastolic blood pressure is above 90.  You have diabetes, and your systolic blood pressure is over 140 or your diastolic blood pressure is over 90.  You have kidney disease and your blood pressure is above 140/90.  You have heart disease and your blood pressure is above 140/90. Your personal target blood pressure may vary depending on your medical   conditions, your age, and other factors. Follow these instructions at home:  Have your blood pressure rechecked as directed by your health care provider.  Take medicines only as directed by your health care provider. Follow the directions carefully. Blood pressure medicines must be taken as prescribed. The medicine does not work as well when you skip  doses. Skipping doses also puts you at risk for problems.  Do not smoke.  Monitor your blood pressure at home as directed by your health care provider. Contact a health care provider if:  You think you are having a reaction to medicines taken.  You have recurrent headaches or feel dizzy.  You have swelling in your ankles.  You have trouble with your vision. Get help right away if:  You develop a severe headache or confusion.  You have unusual weakness, numbness, or feel faint.  You have severe chest or abdominal pain.  You vomit repeatedly.  You have trouble breathing. This information is not intended to replace advice given to you by your health care provider. Make sure you discuss any questions you have with your health care provider. Document Released: 07/19/2005 Document Revised: 12/25/2015 Document Reviewed: 05/11/2013 Elsevier Interactive Patient Education  2017 [employer]  

## 2016-08-03 NOTE — Progress Notes (Signed)
Subjective:  Patient ID: [name], female    DOB: 15-May-1946  Age: 71 y.o. MRN: [id number]  CC: Hypertension   HPI [name] presents for a BP check - She tells me her blood pressure has not been well controlled at home and she has had a few episodes of headache. She denies blurred vision, chest pain, shortness of breath, edema, palpitations, or fatigue. She takes Motrin intermittently for pain.  Outpatient Medications Prior to Visit  Medication Sig Dispense Refill  . Cholecalciferol 2000 units TABS Take 1 tablet (2,000 Units total) by mouth daily. 90 tablet 3  . cyanocobalamin 2000 MCG tablet Take 1 tablet (2,000 mcg total) by mouth daily. 90 tablet 3  . Fexofenadine HCl (MUCINEX ALLERGY PO) Take by mouth.    . INVOKANA 100 MG TABS tablet TAKE 1 TABLET (100 MG TOTAL) BY MOUTH DAILY BEFORE BREAKFAST. 90 tablet 1  . JANUVIA 100 MG tablet TAKE 1 TABLET (100 MG TOTAL) BY MOUTH DAILY. 90 tablet 1  . MELATONIN ER PO Take by mouth.    . nebivolol (BYSTOLIC) 10 MG tablet Take 1 tablet (10 mg total) by mouth daily. 90 tablet 1  . pantoprazole (PROTONIX) 40 MG tablet TAKE 1 TABLET (40 MG TOTAL) BY MOUTH DAILY. REPORTED ON 08/07/2015 90 tablet 3  . sertraline (ZOLOFT) 100 MG tablet Take 100 mg by mouth daily.  12  . tizanidine (ZANAFLEX) 2 MG capsule Take 1 capsule (2 mg total) by mouth 3 (three) times daily. 270 capsule 1  . traZODone (DESYREL) 50 MG tablet Reported on 08/07/2015  3  . isometheptene-acetaminophen-dichloralphenazone (MIDRIN) 65-100-325 MG capsule Take 1 capsule by mouth 4 (four) times daily as needed for migraine. Maximum 5 capsules in 12 hours for migraine headaches, 8 capsules in 24 hours for tension headaches. 30 capsule 0   No facility-administered medications prior to visit.     ROS Review of Systems  Constitutional: Negative.  Negative for appetite change, diaphoresis, fatigue and unexpected weight change.  HENT: Negative.  Negative for trouble swallowing.    Eyes: Negative for visual disturbance.  Respiratory: Negative.  Negative for cough, chest tightness, shortness of breath, wheezing and stridor.   Cardiovascular: Negative for chest pain, palpitations and leg swelling.  Gastrointestinal: Negative.  Negative for abdominal pain, constipation, diarrhea, nausea and vomiting.  Endocrine: Negative.   Genitourinary: Negative.  Negative for decreased urine volume, difficulty urinating and urgency.  Musculoskeletal: Negative for back pain, myalgias and neck pain.  Skin: Negative.  Negative for color change and rash.  Allergic/Immunologic: Negative.   Neurological: Positive for headaches. Negative for dizziness, tremors, weakness and numbness.  Hematological: Negative.  Negative for adenopathy. Does not bruise/bleed easily.  Psychiatric/Behavioral: Negative.     Objective:  BP (!) 180/100 (BP Location: Left Arm, Patient Position: Sitting, Cuff Size: Large)   Pulse 80   Temp 98.5 F (36.9 C) (Oral)   Resp 16   Ht 5\' 5"  (1.651 m)   Wt 246 lb 8 oz (111.8 kg)   SpO2 94%   BMI 41.02 kg/m   BP Readings from Last 3 Encounters:  08/03/16 (!) 180/100  07/07/16 140/84  04/14/16 128/82    Wt Readings from Last 3 Encounters:  08/03/16 246 lb 8 oz (111.8 kg)  07/07/16 244 lb (110.7 kg)  04/14/16 234 lb (106.1 kg)    Physical Exam  Constitutional: She is oriented to person, place, and time. No distress.  HENT:  Head: Normocephalic and atraumatic.  Mouth/Throat: Oropharynx  is clear and moist. No oropharyngeal exudate.  Eyes: Conjunctivae are normal. Right eye exhibits no discharge. Left eye exhibits no discharge. No scleral icterus.  Neck: Normal range of motion. Neck supple. No JVD present. No tracheal deviation present. No thyromegaly present.  Cardiovascular: Normal rate, regular rhythm, normal heart sounds and intact distal pulses.  Exam reveals no gallop and no friction rub.   No murmur heard. Pulmonary/Chest: Effort normal and breath  sounds normal. No stridor. No respiratory distress. She has no wheezes. She has no rales. She exhibits no tenderness.  Abdominal: Soft. Bowel sounds are normal. She exhibits no distension and no mass. There is no tenderness. There is no rebound and no guarding.  Musculoskeletal: Normal range of motion. She exhibits no edema, tenderness or deformity.  Lymphadenopathy:    She has no cervical adenopathy.  Neurological: She is alert and oriented to person, place, and time. She has normal reflexes. She displays normal reflexes. No cranial nerve deficit. She exhibits normal muscle tone. Coordination normal.  Skin: Skin is warm and dry. No rash noted. She is not diaphoretic. No erythema. No pallor.  Vitals reviewed.   Lab Results  Component Value Date   WBC 8.4 05/11/2016   HGB 15.4 (H) 05/11/2016   HCT 45.0 05/11/2016   PLT 261.0 05/11/2016   GLUCOSE 164 (H) 05/11/2016   CHOL 225 (H) 05/11/2016   TRIG 198.0 (H) 05/11/2016   HDL 61.00 05/11/2016   LDLDIRECT 105.0 08/07/2015   LDLCALC 124 (H) 05/11/2016   ALT 14 05/11/2016   AST 15 05/11/2016   NA 139 05/11/2016   K 4.5 05/11/2016   CL 102 05/11/2016   CREATININE 0.89 05/11/2016   BUN 16 05/11/2016   CO2 28 05/11/2016   TSH 2.00 05/11/2016   INR 1.33 07/25/2014   HGBA1C 6.8 (H) 05/11/2016   MICROALBUR <0.7 05/11/2016    Dg Mandible 4 Views  Result Date: 07/07/2016 CLINICAL DATA:  Right lower jaw pain. EXAM: MANDIBLE - 4+ VIEW COMPARISON:  None. FINDINGS: There is no evidence of fracture or other focal bone lesions. Right ventriculostomy catheter directed towards midline. IMPRESSION: Negative. Electronically Signed   By: Kathreen Devoid   On: 07/07/2016 13:28    Assessment & Plan:   Kerrilee was seen today for hypertension.  Diagnoses and all orders for this visit:  Left ventricular diastolic dysfunction, NYHA class 2- she has a normal fluid status but her blood pressure is not well controlled, will add an ARB and thiazide diuretic -      telmisartan-hydrochlorothiazide (MICARDIS HCT) 80-12.5 MG tablet; Take 1 tablet by mouth daily.  Essential hypertension- her blood pressure is not well controlled with just bystolic, I will add an ARB and thiazide diuretic for better blood pressure control. -     telmisartan-hydrochlorothiazide (MICARDIS HCT) 80-12.5 MG tablet; Take 1 tablet by mouth daily.   I have discontinued [name]'s isometheptene-acetaminophen-dichloralphenazone. I am also having her start on telmisartan-hydrochlorothiazide. Additionally, I am having her maintain her traZODone, Cholecalciferol, cyanocobalamin, pantoprazole, sertraline, Fexofenadine HCl (MUCINEX ALLERGY PO), MELATONIN ER PO, tizanidine, nebivolol, JANUVIA, and INVOKANA.  Meds ordered this encounter  Medications  . telmisartan-hydrochlorothiazide (MICARDIS HCT) 80-12.5 MG tablet    Sig: Take 1 tablet by mouth daily.    Dispense:  90 tablet    Refill:  1     Follow-up: No Follow-up on file.  [name], MD

## 2016-08-13 ENCOUNTER — Other Ambulatory Visit: Payer: Self-pay | Admitting: Internal Medicine

## 2016-08-14 ENCOUNTER — Other Ambulatory Visit: Payer: Self-pay | Admitting: Internal Medicine

## 2016-08-22 ENCOUNTER — Other Ambulatory Visit: Payer: Self-pay | Admitting: Internal Medicine

## 2016-09-01 ENCOUNTER — Other Ambulatory Visit: Payer: Self-pay | Admitting: Internal Medicine

## 2016-09-03 ENCOUNTER — Other Ambulatory Visit: Payer: Self-pay | Admitting: Internal Medicine

## 2016-09-03 ENCOUNTER — Telehealth: Payer: Self-pay | Admitting: Internal Medicine

## 2016-09-03 DIAGNOSIS — G43001 Migraine without aura, not intractable, with status migrainosus: Secondary | ICD-10-CM

## 2016-09-03 NOTE — Telephone Encounter (Signed)
Pt is requesting a stronger medication for migraines, does not want an opioid though, please call pt with options. Please adivse MS

## 2016-09-03 NOTE — Telephone Encounter (Signed)
I want her to see a neurologist

## 2016-09-28 ENCOUNTER — Telehealth: Payer: Self-pay | Admitting: Internal Medicine

## 2016-09-28 ENCOUNTER — Encounter: Payer: Self-pay | Admitting: Internal Medicine

## 2016-10-13 ENCOUNTER — Encounter: Payer: Self-pay | Admitting: Internal Medicine

## 2016-10-13 ENCOUNTER — Other Ambulatory Visit (INDEPENDENT_AMBULATORY_CARE_PROVIDER_SITE_OTHER): Payer: Medicare Other

## 2016-10-13 ENCOUNTER — Ambulatory Visit (INDEPENDENT_AMBULATORY_CARE_PROVIDER_SITE_OTHER): Payer: Medicare Other | Admitting: Internal Medicine

## 2016-10-13 VITALS — BP 143/82 | HR 77 | Temp 98.3°F | Wt 253.0 lb

## 2016-10-13 DIAGNOSIS — I10 Essential (primary) hypertension: Secondary | ICD-10-CM

## 2016-10-13 DIAGNOSIS — E118 Type 2 diabetes mellitus with unspecified complications: Secondary | ICD-10-CM

## 2016-10-13 DIAGNOSIS — F5089 Other specified eating disorder: Secondary | ICD-10-CM

## 2016-10-13 DIAGNOSIS — R1310 Dysphagia, unspecified: Secondary | ICD-10-CM | POA: Diagnosis not present

## 2016-10-13 DIAGNOSIS — L98491 Non-pressure chronic ulcer of skin of other sites limited to breakdown of skin: Secondary | ICD-10-CM | POA: Diagnosis not present

## 2016-10-13 DIAGNOSIS — R1319 Other dysphagia: Secondary | ICD-10-CM

## 2016-10-13 LAB — BASIC METABOLIC PANEL
BUN: 30 mg/dL — ABNORMAL HIGH (ref 6–23)
CO2: 27 mEq/L (ref 19–32)
Calcium: 9.7 mg/dL (ref 8.4–10.5)
Chloride: 101 mEq/L (ref 96–112)
Creatinine, Ser: 1.24 mg/dL — ABNORMAL HIGH (ref 0.40–1.20)
GFR: 45.49 mL/min — ABNORMAL LOW (ref >60.00)
Glucose, Bld: 192 mg/dL — ABNORMAL HIGH (ref 70–99)
Potassium: 4.8 mEq/L (ref 3.5–5.1)
Sodium: 137 mEq/L (ref 135–145)

## 2016-10-13 LAB — CBC WITH DIFFERENTIAL/PLATELET
Basophils Absolute: 0.1 10*3/uL (ref 0.0–0.1)
Basophils Relative: 1.1 % (ref 0.0–3.0)
Eosinophils Absolute: 0.3 10*3/uL (ref 0.0–0.7)
Eosinophils Relative: 3.3 % (ref 0.0–5.0)
HCT: 42.6 % (ref 36.0–46.0)
Hemoglobin: 14.5 g/dL (ref 12.0–15.0)
Lymphocytes Relative: 17.6 % (ref 12.0–46.0)
Lymphs Abs: 1.4 10*3/uL (ref 0.7–4.0)
MCHC: 34 g/dL (ref 30.0–36.0)
MCV: 92 fl (ref 78.0–100.0)
Monocytes Absolute: 0.5 10*3/uL (ref 0.1–1.0)
Monocytes Relative: 6.2 % (ref 3.0–12.0)
Neutro Abs: 5.5 10*3/uL (ref 1.4–7.7)
Neutrophils Relative %: 71.8 % (ref 43.0–77.0)
Platelets: 245 10*3/uL (ref 150.0–400.0)
RBC: 4.63 Mil/uL (ref 3.87–5.11)
RDW: 13.6 % (ref 11.5–15.5)
WBC: 7.7 10*3/uL (ref 4.0–10.5)

## 2016-10-13 LAB — HEMOGLOBIN A1C: Hgb A1c MFr Bld: 8.5 % — ABNORMAL HIGH (ref 4.6–6.5)

## 2016-10-13 MED ORDER — INSULIN GLARGINE 300 UNIT/ML ~~LOC~~ SOPN: 30.0000 [IU] | PEN_INJECTOR | Freq: Every day | SUBCUTANEOUS | 11 refills | Status: DC

## 2016-10-13 MED ORDER — INSULIN PEN NEEDLE 32G X 6 MM MISC: 1.0000 | Freq: Every day | 3 refills | Status: DC

## 2016-10-13 MED ORDER — GLUCOSE BLOOD VI STRP: ORAL_STRIP | 11 refills | Status: DC

## 2016-10-13 MED ORDER — ONETOUCH ULTRA SYSTEM W/DEVICE KIT: 1.0000 | PACK | Freq: Once | 1 refills | Status: AC

## 2016-10-13 NOTE — Patient Instructions (Signed)

## 2016-10-13 NOTE — Progress Notes (Signed)
Pre visit review using our clinic review tool, if applicable. No additional management support is needed unless otherwise documented below in the visit note. 

## 2016-10-13 NOTE — Progress Notes (Signed)
Subjective:  Patient ID: [name], female    DOB: 1946-03-31  Age: 71 y.o. MRN: [id number]  CC: Emesis   HPI [name] presents for A 3 week history of vomiting. She tells me that once a day in the evening she vomits up clear liquids. She has not seen any blood or hematemesis. She also tells me that she feels like food is getting stuck in the lower part of her esophagus. She has to drink water to make the food go down. She has not lost weight, maintains a normal appetite, and denies abdominal pain, constipation, or diarrhea. She tells me she previously saw a gastroenterologist and was told that she had duodenal ulcers. She is maintained currently on a proton pump inhibitor.  Outpatient Medications Prior to Visit  Medication Sig Dispense Refill  . Cholecalciferol 2000 units TABS Take 1 tablet (2,000 Units total) by mouth daily. 90 tablet 3  . cyanocobalamin 2000 MCG tablet Take 1 tablet (2,000 mcg total) by mouth daily. 90 tablet 3  . Fexofenadine HCl (MUCINEX ALLERGY PO) Take by mouth.    . INVOKANA 100 MG TABS tablet TAKE 1 TABLET (100 MG TOTAL) BY MOUTH DAILY BEFORE BREAKFAST. 90 tablet 1  . JANUVIA 100 MG tablet TAKE 1 TABLET (100 MG TOTAL) BY MOUTH DAILY. 90 tablet 1  . MELATONIN ER PO Take by mouth.    . nebivolol (BYSTOLIC) 10 MG tablet Take 1 tablet (10 mg total) by mouth daily. 90 tablet 1  . pantoprazole (PROTONIX) 40 MG tablet TAKE 1 TABLET (40 MG TOTAL) BY MOUTH DAILY. REPORTED ON 08/07/2015 90 tablet 3  . sertraline (ZOLOFT) 100 MG tablet Take 100 mg by mouth daily.  12  . telmisartan-hydrochlorothiazide (MICARDIS HCT) 80-12.5 MG tablet Take 1 tablet by mouth daily. 90 tablet 1  . tizanidine (ZANAFLEX) 2 MG capsule Take 1 capsule (2 mg total) by mouth 3 (three) times daily. 270 capsule 1  . traZODone (DESYREL) 50 MG tablet Reported on 08/07/2015  3   No facility-administered medications prior to visit.     ROS Review of Systems  Constitutional: Negative for  activity change, appetite change, diaphoresis and fatigue.  HENT: Positive for trouble swallowing. Negative for facial swelling, sinus pressure, sore throat and voice change.   Eyes: Negative for visual disturbance.  Respiratory: Negative for cough, chest tightness and shortness of breath.   Cardiovascular: Negative for chest pain, palpitations and leg swelling.  Gastrointestinal: Positive for vomiting. Negative for abdominal pain, blood in stool, constipation, diarrhea, nausea and rectal pain.  Endocrine: Negative.   Genitourinary: Negative.  Negative for difficulty urinating.  Musculoskeletal: Negative.   Skin: Negative.   Allergic/Immunologic: Negative.   Neurological: Negative.  Negative for dizziness, weakness, light-headedness and headaches.  Hematological: Negative for adenopathy. Does not bruise/bleed easily.  Psychiatric/Behavioral: Negative.     Objective:  BP (!) 143/82 (BP Location: Left Arm, Patient Position: Sitting)   Pulse 77   Temp 98.3 F (36.8 C) (Oral)   Wt 253 lb (114.8 kg)   SpO2 98%   BMI 42.10 kg/m   BP Readings from Last 3 Encounters:  10/13/16 (!) 143/82  08/03/16 (!) 180/100  07/07/16 140/84    Wt Readings from Last 3 Encounters:  10/13/16 253 lb (114.8 kg)  08/03/16 246 lb 8 oz (111.8 kg)  07/07/16 244 lb (110.7 kg)    Physical Exam  Constitutional: She is oriented to person, place, and time. No distress.  HENT:  Mouth/Throat: Oropharynx is  clear and moist. No oropharyngeal exudate.  Eyes: Conjunctivae are normal. Right eye exhibits no discharge. Left eye exhibits no discharge. No scleral icterus.  Neck: Normal range of motion. Neck supple. No JVD present. No tracheal deviation present. No thyromegaly present.  Cardiovascular: Normal rate, regular rhythm, normal heart sounds and intact distal pulses.  Exam reveals no gallop and no friction rub.   No murmur heard. Pulmonary/Chest: Effort normal and breath sounds normal. No stridor. No  respiratory distress. She has no wheezes. She has no rales. She exhibits no tenderness.  Abdominal: Soft. Normal appearance and bowel sounds are normal. She exhibits no distension and no mass. There is no hepatosplenomegaly. There is no tenderness. There is no rebound, no guarding and no CVA tenderness.  Musculoskeletal: Normal range of motion. She exhibits no edema, tenderness or deformity.  Lymphadenopathy:    She has no cervical adenopathy.  Neurological: She is oriented to person, place, and time.  Skin: Skin is warm and dry. No rash noted. She is not diaphoretic. No erythema. No pallor.  Vitals reviewed.   Lab Results  Component Value Date   WBC 7.7 10/13/2016   HGB 14.5 10/13/2016   HCT 42.6 10/13/2016   PLT 245.0 10/13/2016   GLUCOSE 192 (H) 10/13/2016   CHOL 225 (H) 05/11/2016   TRIG 198.0 (H) 05/11/2016   HDL 61.00 05/11/2016   LDLDIRECT 105.0 08/07/2015   LDLCALC 124 (H) 05/11/2016   ALT 14 05/11/2016   AST 15 05/11/2016   NA 137 10/13/2016   K 4.8 10/13/2016   CL 101 10/13/2016   CREATININE 1.24 (H) 10/13/2016   BUN 30 (H) 10/13/2016   CO2 27 10/13/2016   TSH 2.00 05/11/2016   INR 1.33 07/25/2014   HGBA1C 8.5 (H) 10/13/2016   MICROALBUR <0.7 05/11/2016    Dg Mandible 4 Views  Result Date: 07/07/2016 CLINICAL DATA:  Right lower jaw pain. EXAM: MANDIBLE - 4+ VIEW COMPARISON:  None. FINDINGS: There is no evidence of fracture or other focal bone lesions. Right ventriculostomy catheter directed towards midline. IMPRESSION: Negative. Electronically Signed   By: Kathreen Devoid   On: 07/07/2016 13:28    Assessment & Plan:   Kiara was seen today for emesis.  Diagnoses and all orders for this visit:  Psychogenic vomiting with nausea- she only vomits once a day and is not losing weight though her labs today show mild dehydration, I do not think this warrants medical therapy. I have asked her to stay well hydrated.  Esophageal dysphagia- I have asked her to see GI to see  if she needs to undergo another upper endoscopy. -     Ambulatory referral to Gastroenterology  Callous ulcer, limited to breakdown of skin ([place]) -     Ambulatory referral to Podiatry  Type 2 diabetes mellitus with complication, without long-term current use of insulin ([place])- her A1c is up to 8.5%, I've asked her to add basal insulin to her current regimen and if also asked her to start monitoring her blood sugar and to be seen by diabetic education. -     Ambulatory referral to Podiatry -     Basic metabolic panel; Future -     Hemoglobin A1c; Future  Essential hypertension- her blood pressure is adequately well controlled, she is mildly dehydrated so I've asked her to increase her intake of free liquids. -     CBC with Differential/Platelet; Future -     Basic metabolic panel; Future   I am having Ms.  Wolf start on Insulin Glargine, ONE TOUCH ULTRA SYSTEM KIT, glucose blood, and Insulin Pen Needle. I am also having her maintain her traZODone, Cholecalciferol, cyanocobalamin, pantoprazole, sertraline, Fexofenadine HCl (MUCINEX ALLERGY PO), MELATONIN ER PO, tizanidine, nebivolol, JANUVIA, INVOKANA, and telmisartan-hydrochlorothiazide.  Meds ordered this encounter  Medications  . Insulin Glargine (TOUJEO SOLOSTAR) 300 UNIT/ML SOPN    Sig: Inject 30 Units into the skin daily.    Dispense:  1.5 mL    Refill:  11  . Blood Glucose Monitoring Suppl (ONE TOUCH ULTRA SYSTEM KIT) w/Device KIT    Sig: 1 kit by Does not apply route once. USE TID    Dispense:  1 each    Refill:  1  . glucose blood test strip    Sig: Use TID    Dispense:  100 each    Refill:  11  . Insulin Pen Needle (NOVOFINE) 32G X 6 MM MISC    Sig: 1 Act by Does not apply route daily.    Dispense:  100 each    Refill:  3     Follow-up: Return in about 6 months (around 04/15/2017).  [name], MD

## 2016-10-21 ENCOUNTER — Telehealth: Payer: Self-pay | Admitting: Internal Medicine

## 2016-10-21 NOTE — Telephone Encounter (Signed)
Requesting A1C and BP reading for patient.  States will be sending fax over in regard to patient wanting diabetic classes and referral for mammogram.  Did notify that patient could schedule mammogram herself.

## 2016-10-22 NOTE — Telephone Encounter (Signed)
Is this information sent when referrals are done?

## 2016-10-31 ENCOUNTER — Other Ambulatory Visit: Payer: Self-pay | Admitting: Internal Medicine

## 2016-11-08 ENCOUNTER — Ambulatory Visit (INDEPENDENT_AMBULATORY_CARE_PROVIDER_SITE_OTHER): Payer: Medicare Other | Admitting: Podiatry

## 2016-11-08 ENCOUNTER — Encounter: Payer: Self-pay | Admitting: Podiatry

## 2016-11-08 DIAGNOSIS — L84 Corns and callosities: Secondary | ICD-10-CM

## 2016-11-08 DIAGNOSIS — L851 Acquired keratosis [keratoderma] palmaris et plantaris: Secondary | ICD-10-CM | POA: Diagnosis not present

## 2016-11-08 DIAGNOSIS — M79672 Pain in left foot: Secondary | ICD-10-CM

## 2016-11-08 DIAGNOSIS — M79671 Pain in right foot: Secondary | ICD-10-CM

## 2016-11-09 NOTE — Progress Notes (Signed)
   Subjective: Patient with PMHx of T2DM presents to the office today for chief complaint of painful callus lesions of the right foot. Patient states that the pain is ongoing for the past 4-6 months and describes it as a deep aching/burning and is affecting their ability to ambulate without pain. Wearing tennis shoes increases her pain. Patient presents today for further treatment and evaluation.  Objective:  Physical Exam General: Alert and oriented x3 in no acute distress  Dermatology: Hyperkeratotic lesions x 3 present on the right sub-fifth MPJ. Pain on palpation with a central nucleated core noted.  Skin is warm, dry and supple bilateral lower extremities. Negative for open lesions or macerations.  Vascular: Palpable pedal pulses bilaterally. No edema or erythema noted. Capillary refill within normal limits.  Neurological: Epicritic and protective threshold grossly intact bilaterally.   Musculoskeletal Exam: Pain on palpation at the keratotic lesion noted. Range of motion within normal limits bilateral. Muscle strength 5/5 in all groups bilateral.  Assessment: #1 Porokeratosis of right sub-fifth MPJ x 3   Plan of Care:  #1 Patient evaluated #2 Excisional debridement of 3 keratoic lesions using a chisel blade was performed without incident.  #3 Treated area(s) with Salinocaine and dressed with light dressing. #4 Patient is to return to the clinic PRN.   Edrick Kins, DPM [facility]  Dr. Edrick Kins, [address]                                        [place], [place] [address]                Office [phone]  Fax [phone]

## 2016-11-15 ENCOUNTER — Encounter: Payer: Self-pay | Admitting: Endocrinology

## 2016-11-15 ENCOUNTER — Ambulatory Visit (INDEPENDENT_AMBULATORY_CARE_PROVIDER_SITE_OTHER): Payer: Medicare Other | Admitting: Endocrinology

## 2016-11-15 VITALS — BP 138/86 | HR 73 | Resp 16 | Ht 65.0 in | Wt 252.2 lb

## 2016-11-15 DIAGNOSIS — Z794 Long term (current) use of insulin: Secondary | ICD-10-CM | POA: Diagnosis not present

## 2016-11-15 DIAGNOSIS — E118 Type 2 diabetes mellitus with unspecified complications: Secondary | ICD-10-CM | POA: Diagnosis not present

## 2016-11-15 MED ORDER — INSULIN GLARGINE 300 UNIT/ML ~~LOC~~ SOPN: 20.0000 [IU] | PEN_INJECTOR | Freq: Every day | SUBCUTANEOUS | 11 refills | Status: DC

## 2016-11-15 MED ORDER — REPAGLINIDE 2 MG PO TABS: 2.0000 mg | ORAL_TABLET | Freq: Three times a day (TID) | ORAL | 11 refills | Status: DC

## 2016-11-15 NOTE — Progress Notes (Signed)
Subjective:    Patient ID: [name], female    DOB: 21-Jun-1946, 71 y.o.   MRN: [id number]  HPI pt is referred by Dr Ronnald Ramp, for diabetes.  Pt states DM was dx'ed in 2017; she has mild if any neuropathy of the lower extremities; but she has associated renal insufficiency; she has been on insulin x 2 weeks; pt says his diet and exercise are poor; she has never had GDM (G0)  pancreatic surgery, severe hypoglycemia or DKA.  She has had pancreatitis ([place]) 3 times.  She takes toujeo and 2 oral meds.  She says cbg's vary from 130-200's.  It is in general higher as the day goes on.  She is retired.   Past Medical History:  Diagnosis Date  . Acid reflux disease   . Acute kidney injury ([place])   . Anxiety   . Depression   . Diabetes mellitus without complication ([place])   . Dizziness   . Falls   . Hydrocephalus   . Hypertension   . Hypotension   . MVP (mitral valve prolapse)   . Sinus bradycardia   . Transaminitis     Past Surgical History:  Procedure Laterality Date  . BRAIN SURGERY    . CHOLECYSTECTOMY    . ERCP N/A 02/28/2014   Procedure: ENDOSCOPIC RETROGRADE CHOLANGIOPANCREATOGRAPHY (ERCP);  Surgeon: Gatha Mayer, MD;  Location: [facility] ENDOSCOPY;  Service: Endoscopy;  Laterality: N/A;  talked to tiffany from or /ebp  . ESOPHAGOGASTRODUODENOSCOPY N/A 02/28/2014   Procedure: ESOPHAGOGASTRODUODENOSCOPY (EGD);  Surgeon: Gatha Mayer, MD;  Location: [facility] ENDOSCOPY;  Service: Endoscopy;  Laterality: N/A;  . VENTRICULOPERITONEAL SHUNT     right occipital    Social History   Social History  . Marital status: Divorced    Spouse name: N/A  . Number of children: N/A  . Years of education: N/A   Occupational History  . Not on file.   Social History Main Topics  . Smoking status: Never Smoker  . Smokeless tobacco: Never Used  . Alcohol use No  . Drug use: No  . Sexual activity: Not Currently   Other Topics Concern  . Not on file   Social History Narrative  . No narrative on  file    Current Outpatient Prescriptions on File Prior to Visit  Medication Sig Dispense Refill  . Cholecalciferol 2000 units TABS Take 1 tablet (2,000 Units total) by mouth daily. 90 tablet 3  . cyanocobalamin 2000 MCG tablet Take 1 tablet (2,000 mcg total) by mouth daily. 90 tablet 3  . Fexofenadine HCl (MUCINEX ALLERGY PO) Take by mouth.    Marland Kitchen glucose blood test strip Use TID 100 each 11  . Insulin Pen Needle (NOVOFINE) 32G X 6 MM MISC 1 Act by Does not apply route daily. 100 each 3  . INVOKANA 100 MG TABS tablet TAKE 1 TABLET (100 MG TOTAL) BY MOUTH DAILY BEFORE BREAKFAST. 90 tablet 1  . JANUVIA 100 MG tablet TAKE 1 TABLET (100 MG TOTAL) BY MOUTH DAILY. 90 tablet 1  . MELATONIN ER PO Take by mouth.    . nebivolol (BYSTOLIC) 10 MG tablet Take 1 tablet (10 mg total) by mouth daily. 90 tablet 1  . pantoprazole (PROTONIX) 40 MG tablet TAKE 1 TABLET (40 MG TOTAL) BY MOUTH DAILY. REPORTED ON 08/07/2015 90 tablet 3  . sertraline (ZOLOFT) 100 MG tablet Take 100 mg by mouth daily.  12  . telmisartan-hydrochlorothiazide (MICARDIS HCT) 80-12.5 MG tablet Take 1 tablet by mouth  daily. 90 tablet 1  . tizanidine (ZANAFLEX) 2 MG capsule TAKE ONE CAPSULE 3 TIMES A DAY 270 capsule 1  . traZODone (DESYREL) 50 MG tablet Reported on 08/07/2015  3   No current facility-administered medications on file prior to visit.     Allergies  Allergen Reactions  . Metformin And Related Diarrhea    (takes metformin at home w/ meals)  . Ace Inhibitors Other (See Comments)    unknown  . Erythromycin Hives  . Penicillins Hives    Family History  Problem Relation Age of Onset  . Heart disease Mother   . Stroke Mother   . Hypertension Mother   . Alcohol abuse Father   . Diabetes Father   . Cancer Neg Hx     BP 138/86 (BP Location: Right Arm, Cuff Size: Large)   Pulse 73   Resp 16   Ht 5\' 5"  (1.651 m)   Wt 252 lb 3.2 oz (114.4 kg)   SpO2 93%   BMI 41.97 kg/m    Review of Systems denies weight loss,  diplopia, chest pain, sob, dry skin memory loss, cold intolerance, and rhinorrhea.   She has intermittent headache, urinary frequency, leg cramps, easy bruising, and n/v.     Objective:   Physical Exam VS: see vs page GEN: no distress HEAD: head: no deformity eyes: no periorbital swelling, no proptosis.   external nose and ears are normal mouth: no lesion seen.   Ears: bilat ha's.   NECK: supple, thyroid is not enlarged CHEST WALL: no deformity LUNGS: clear to auscultation CV: reg rate and rhythm, no murmur ABD: abdomen is soft, nontender.  no hepatosplenomegaly.  not distended.  no hernia MUSCULOSKELETAL: muscle bulk and strength are grossly normal.  no obvious joint swelling.  gait is normal and steady EXTEMITIES: no deformity.  no ulcer on the feet.  feet are of normal color and temp.  Trace bilat leg edema PULSES: dorsalis pedis intact bilat.  no carotid bruit NEURO:  cn 2-12 grossly intact, except for hearing loss.   readily moves all 4's.  sensation is intact to touch on the feet.  SKIN:  Normal texture and temperature.  No rash or suspicious lesion is visible.   NODES:  None palpable at the neck.  PSYCH: alert, well-oriented.  Does not appear anxious nor depressed.     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  No MI.  No hypertrophy. Compared to 2015: ST is new, but long QT is resolved.     Assessment & Plan:  Type 2 DM: she needs increased rx. She may be manageable on oral rx.  Edema: this limits rx options.  Obesity, new to me: she declines surgery.  Renal insuff: this also limits oral rx.    Patient Instructions  good diet and exercise significantly improve  the control of your diabetes.  please let me know if you wish to be referred to a dietician.  high blood sugar is very risky to your health.  you should see an eye doctor and dentist every year.  It is very important to get all recommended vaccinations.   Controlling your blood  pressure and cholesterol drastically reduces the damage diabetes does to your body.  Those who smoke should quit.  Please discuss these with your doctor.   check your blood sugar 4 times a day.  vary the time of day when you check, between before the 3 meals, and at bedtime.  also check if you have symptoms of your blood sugar being too high or too low.  please keep a record of the readings and bring it to your next appointment here (or you can bring the meter itself).  You can write it on any piece of paper.  please call us sooner if your blood sugar goes below 70, or if you have a lot of readings over 200.   for now, please: Reduce the insulin to 20 units each morning, and: Start "repaglinide."  I have sent a prescription to your pharmacy, and: Please continue the same other diabetes medications.   Please come back for a follow-up appointment in 2 weeks.

## 2016-11-15 NOTE — Patient Instructions (Addendum)
good diet and exercise significantly improve the control of your diabetes.  please let me know if you wish to be referred to a dietician.  high blood sugar is very risky to your health.  you should see an eye doctor and dentist every year.  It is very important to get all recommended vaccinations.   Controlling your blood pressure and cholesterol drastically reduces the damage diabetes does to your body.  Those who smoke should quit.  Please discuss these with your doctor.   check your blood sugar 4 times a day.  vary the time of day when you check, between before the 3 meals, and at bedtime.  also check if you have symptoms of your blood sugar being too high or too low.  please keep a record of the readings and bring it to your next appointment here (or you can bring the meter itself).  You can write it on any piece of paper.  please call us sooner if your blood sugar goes below 70, or if you have a lot of readings over 200.   for now, please: Reduce the insulin to 20 units each morning, and: Start "repaglinide."  I have sent a prescription to your pharmacy, and: Please continue the same other diabetes medications.   Please come back for a follow-up appointment in 2 weeks.

## 2016-11-26 ENCOUNTER — Other Ambulatory Visit: Payer: Self-pay | Admitting: Internal Medicine

## 2016-11-26 DIAGNOSIS — G43001 Migraine without aura, not intractable, with status migrainosus: Secondary | ICD-10-CM

## 2016-11-26 DIAGNOSIS — I519 Heart disease, unspecified: Secondary | ICD-10-CM

## 2016-11-26 DIAGNOSIS — I10 Essential (primary) hypertension: Secondary | ICD-10-CM

## 2016-11-26 DIAGNOSIS — I5189 Other ill-defined heart diseases: Secondary | ICD-10-CM

## 2016-11-30 ENCOUNTER — Ambulatory Visit (INDEPENDENT_AMBULATORY_CARE_PROVIDER_SITE_OTHER): Payer: Medicare Other | Admitting: Internal Medicine

## 2016-11-30 ENCOUNTER — Encounter: Payer: Self-pay | Admitting: Internal Medicine

## 2016-11-30 VITALS — BP 158/98 | HR 70 | Temp 98.9°F | Resp 16 | Ht 65.0 in | Wt 255.0 lb

## 2016-11-30 DIAGNOSIS — J029 Acute pharyngitis, unspecified: Secondary | ICD-10-CM

## 2016-11-30 DIAGNOSIS — B9789 Other viral agents as the cause of diseases classified elsewhere: Secondary | ICD-10-CM

## 2016-11-30 DIAGNOSIS — J028 Acute pharyngitis due to other specified organisms: Secondary | ICD-10-CM

## 2016-11-30 DIAGNOSIS — I10 Essential (primary) hypertension: Secondary | ICD-10-CM

## 2016-11-30 DIAGNOSIS — G43001 Migraine without aura, not intractable, with status migrainosus: Secondary | ICD-10-CM

## 2016-11-30 DIAGNOSIS — G910 Communicating hydrocephalus: Secondary | ICD-10-CM

## 2016-11-30 MED ORDER — TOPIRAMATE 25 MG PO CPSP: 25.0000 mg | ORAL_CAPSULE | Freq: Two times a day (BID) | ORAL | 2 refills | Status: DC

## 2016-11-30 NOTE — Patient Instructions (Signed)

## 2016-11-30 NOTE — Progress Notes (Signed)
Pre visit review using our clinic review tool, if applicable. No additional management support is needed unless otherwise documented below in the visit note. 

## 2016-11-30 NOTE — Progress Notes (Signed)
Subjective:  Patient ID: [name], female    DOB: [birth year]  Age: 72 y.o. MRN: [id number]  CC: Sore Throat; Hypertension; and Headache   HPI MAKELLE MARRONE presents for concerns about chronic headaches. Her last HA started about 4 days ago and responded to imitrex but she is concerned that she has HA's so frequently and they last for long periods of time. She is also due for a shunt f/up regarding NPH.  She also complains of a scratchy throat for about 2 weeks. She has controlled the pain with tylenol  Outpatient Medications Prior to Visit  Medication Sig Dispense Refill  . BYSTOLIC 10 MG tablet TAKE 1 TABLET BY MOUTH EVERY DAY 90 tablet 1  . Cholecalciferol 2000 units TABS Take 1 tablet (2,000 Units total) by mouth daily. 90 tablet 3  . cyanocobalamin 2000 MCG tablet Take 1 tablet (2,000 mcg total) by mouth daily. 90 tablet 3  . Fexofenadine HCl (MUCINEX ALLERGY PO) Take by mouth.    Marland Kitchen glucose blood test strip Use TID 100 each 11  . Insulin Glargine (TOUJEO SOLOSTAR) 300 UNIT/ML SOPN Inject 20 Units into the skin daily. 1.5 mL 11  . Insulin Pen Needle (NOVOFINE) 32G X 6 MM MISC 1 Act by Does not apply route daily. 100 each 3  . INVOKANA 100 MG TABS tablet TAKE 1 TABLET (100 MG TOTAL) BY MOUTH DAILY BEFORE BREAKFAST. 90 tablet 1  . JANUVIA 100 MG tablet TAKE 1 TABLET (100 MG TOTAL) BY MOUTH DAILY. 90 tablet 1  . MELATONIN ER PO Take by mouth.    . pantoprazole (PROTONIX) 40 MG tablet TAKE 1 TABLET (40 MG TOTAL) BY MOUTH DAILY. REPORTED ON 08/07/2015 90 tablet 3  . repaglinide (PRANDIN) 2 MG tablet Take 1 tablet (2 mg total) by mouth 3 (three) times daily before meals. 90 tablet 11  . sertraline (ZOLOFT) 100 MG tablet Take 100 mg by mouth daily.  12  . telmisartan-hydrochlorothiazide (MICARDIS HCT) 80-12.5 MG tablet Take 1 tablet by mouth daily. 90 tablet 1  . tizanidine (ZANAFLEX) 2 MG capsule TAKE ONE CAPSULE 3 TIMES A DAY 270 capsule 1  . traZODone (DESYREL) 50 MG tablet  Reported on 08/07/2015  3   No facility-administered medications prior to visit.     ROS Review of Systems  Constitutional: Negative for chills, fatigue and fever.  HENT: Positive for sore throat. Negative for facial swelling, trouble swallowing and voice change.   Eyes: Negative.   Respiratory: Negative.  Negative for cough, chest tightness, shortness of breath and wheezing.   Cardiovascular: Negative for chest pain, palpitations and leg swelling.  Gastrointestinal: Negative for abdominal pain, constipation, diarrhea, nausea and vomiting.  Genitourinary: Negative for difficulty urinating.  Musculoskeletal: Negative.  Negative for back pain, myalgias and neck pain.  Skin: Negative.   Allergic/Immunologic: Negative.   Neurological: Positive for headaches. Negative for dizziness, tremors, syncope, facial asymmetry, weakness and numbness.  Hematological: Negative for adenopathy. Does not bruise/bleed easily.  Psychiatric/Behavioral: Negative.     Objective:  BP (!) 158/98 (BP Location: Left Arm, Patient Position: Sitting, Cuff Size: Large)   Pulse 70   Temp 98.9 F (37.2 C) (Oral)   Resp 16   Ht 5\' 5"  (1.651 m)   Wt 255 lb (115.7 kg)   SpO2 100%   BMI 42.43 kg/m   BP Readings from Last 3 Encounters:  11/30/16 (!) 158/98  11/15/16 138/86  10/13/16 (!) 143/82    Wt Readings from Last 3  Encounters:  11/30/16 255 lb (115.7 kg)  11/15/16 252 lb 3.2 oz (114.4 kg)  10/13/16 253 lb (114.8 kg)    Physical Exam  Constitutional: She is oriented to person, place, and time.  Non-toxic appearance. She does not have a sickly appearance. She does not appear ill. No distress.  HENT:  Mouth/Throat: Oropharynx is clear and moist and mucous membranes are normal. Mucous membranes are not pale, not dry and not cyanotic. No trismus in the jaw. No uvula swelling. No oropharyngeal exudate, posterior oropharyngeal edema, posterior oropharyngeal erythema or tonsillar abscesses.  Eyes: Conjunctivae  and EOM are normal. Pupils are equal, round, and reactive to light. Right eye exhibits no discharge. Left eye exhibits no discharge. No scleral icterus.  Neck: Normal range of motion. Neck supple. No JVD present. No tracheal deviation present. No thyromegaly present.  Cardiovascular: Normal rate, regular rhythm, normal heart sounds and intact distal pulses.  Exam reveals no gallop and no friction rub.   No murmur heard. Pulmonary/Chest: Effort normal and breath sounds normal. No stridor. No respiratory distress. She has no wheezes. She has no rales. She exhibits no tenderness.  Abdominal: Soft. Bowel sounds are normal. She exhibits no distension and no mass. There is no tenderness. There is no rebound and no guarding.  Musculoskeletal: Normal range of motion. She exhibits no edema, tenderness or deformity.  Lymphadenopathy:    She has no cervical adenopathy.  Neurological: She is oriented to person, place, and time.  Skin: Skin is warm and dry. No rash noted. She is not diaphoretic. No erythema.  Vitals reviewed.   Lab Results  Component Value Date   WBC 7.7 10/13/2016   HGB 14.5 10/13/2016   HCT 42.6 10/13/2016   PLT 245.0 10/13/2016   GLUCOSE 192 (H) 10/13/2016   CHOL 225 (H) 05/11/2016   TRIG 198.0 (H) 05/11/2016   HDL 61.00 05/11/2016   LDLDIRECT 105.0 08/07/2015   LDLCALC 124 (H) 05/11/2016   ALT 14 05/11/2016   AST 15 05/11/2016   NA 137 10/13/2016   K 4.8 10/13/2016   CL 101 10/13/2016   CREATININE 1.24 (H) 10/13/2016   BUN 30 (H) 10/13/2016   CO2 27 10/13/2016   TSH 2.00 05/11/2016   INR 1.33 07/25/2014   HGBA1C 8.5 (H) 10/13/2016   MICROALBUR <0.7 05/11/2016    Dg Mandible 4 Views  Result Date: 07/07/2016 CLINICAL DATA:  Right lower jaw pain. EXAM: MANDIBLE - 4+ VIEW COMPARISON:  None. FINDINGS: There is no evidence of fracture or other focal bone lesions. Right ventriculostomy catheter directed towards midline. IMPRESSION: Negative. Electronically Signed   By:  Kathreen Devoid   On: 07/07/2016 13:28    Assessment & Plan:   Finlee was seen today for sore throat, hypertension and headache.  Diagnoses and all orders for this visit:  Essential hypertension- her BP is not well controlled but she is not willing to add another BP med at this time  HYDROCEPHALUS, NORMAL PRESSURE- I have asked her to NS to have her shunt evaluated to see if this is contributing to her HA -     Ambulatory referral to Neurosurgery  Migraine without aura and with status migrainosus, not intractable- will start topamax at a low dose, will increase the dose based on her response, will cont imitrex as needed -     topiramate (TOPAMAX) 25 MG capsule; Take 1 capsule (25 mg total) by mouth 2 (two) times daily.  Acute viral pharyngitis- no treatment indicated,  I am having Ms. Mcclinton start on topiramate. I am also having her maintain her traZODone, Cholecalciferol, cyanocobalamin, pantoprazole, sertraline, Fexofenadine HCl (MUCINEX ALLERGY PO), MELATONIN ER PO, JANUVIA, INVOKANA, telmisartan-hydrochlorothiazide, glucose blood, Insulin Pen Needle, tizanidine, Insulin Glargine, repaglinide, and BYSTOLIC.  Meds ordered this encounter  Medications  . topiramate (TOPAMAX) 25 MG capsule    Sig: Take 1 capsule (25 mg total) by mouth 2 (two) times daily.    Dispense:  60 capsule    Refill:  2     Follow-up: Return in about 6 weeks (around 01/11/2017).  [name], MD

## 2016-12-08 ENCOUNTER — Other Ambulatory Visit: Payer: Self-pay | Admitting: Internal Medicine

## 2016-12-08 DIAGNOSIS — Z760 Encounter for issue of repeat prescription: Secondary | ICD-10-CM

## 2016-12-16 ENCOUNTER — Ambulatory Visit (INDEPENDENT_AMBULATORY_CARE_PROVIDER_SITE_OTHER): Payer: Medicare Other | Admitting: Endocrinology

## 2016-12-16 ENCOUNTER — Encounter: Payer: Medicare Other | Attending: Endocrinology | Admitting: Dietician

## 2016-12-16 ENCOUNTER — Encounter: Payer: Self-pay | Admitting: Endocrinology

## 2016-12-16 ENCOUNTER — Encounter: Payer: Self-pay | Admitting: Dietician

## 2016-12-16 DIAGNOSIS — Z713 Dietary counseling and surveillance: Secondary | ICD-10-CM | POA: Diagnosis not present

## 2016-12-16 DIAGNOSIS — E1122 Type 2 diabetes mellitus with diabetic chronic kidney disease: Secondary | ICD-10-CM | POA: Diagnosis not present

## 2016-12-16 DIAGNOSIS — Z794 Long term (current) use of insulin: Secondary | ICD-10-CM

## 2016-12-16 DIAGNOSIS — E118 Type 2 diabetes mellitus with unspecified complications: Secondary | ICD-10-CM

## 2016-12-16 DIAGNOSIS — N189 Chronic kidney disease, unspecified: Secondary | ICD-10-CM | POA: Insufficient documentation

## 2016-12-16 DIAGNOSIS — I129 Hypertensive chronic kidney disease with stage 1 through stage 4 chronic kidney disease, or unspecified chronic kidney disease: Secondary | ICD-10-CM | POA: Diagnosis not present

## 2016-12-16 LAB — POCT GLYCOSYLATED HEMOGLOBIN (HGB A1C): Hemoglobin A1C: 7.2

## 2016-12-16 MED ORDER — BROMOCRIPTINE MESYLATE 2.5 MG PO TABS: ORAL_TABLET | ORAL | 11 refills | Status: DC

## 2016-12-16 MED ORDER — INSULIN GLARGINE 300 UNIT/ML ~~LOC~~ SOPN: 10.0000 [IU] | PEN_INJECTOR | Freq: Every day | SUBCUTANEOUS | 11 refills | Status: DC

## 2016-12-16 NOTE — Patient Instructions (Addendum)
Rethink what you drink.  Aim for beverages without carbohydrates such as unsweetened tea and water. Try to stay active (armchair exercises, [employer] pool or biking). Be mindful about your food choices.   Eat slowly and stop when you are satisfied. Consider eating away from the TV. Increase your non starchy vegetable intake.  Aim for 2-3 Carb Choices per meal (30-45 grams) +/- 1 either way  Aim for 0-1 Carbs per snack if hungry  Include protein in moderation with your meals and snacks Consider reading food labels for Total Carbohydrate and Fat Grams of foods Consider checking BG at alternate times per day as directed by MD  Continue taking medication as directed by MD

## 2016-12-16 NOTE — Patient Instructions (Addendum)
check your blood sugar 4 times a day.  vary the time of day when you check, between before the 3 meals, and at bedtime.  also check if you have symptoms of your blood sugar being too high or too low.  please keep a record of the readings and bring it to your next appointment here (or you can bring the meter itself).  You can write it on any piece of paper.  please call us sooner if your blood sugar goes below 70, or if you have a lot of readings over 200.   for now, please: Reduce the insulin to 10 units each morning, and: Start "bromocriptine."  I have sent a prescription to your pharmacy, and: Please continue the same other diabetes medications.   Please come back for a follow-up appointment in 2 weeks.

## 2016-12-16 NOTE — Progress Notes (Signed)
Subjective:    Patient ID: [name], female    DOB: 03/09/1946, 71 y.o.   MRN: [id number]  HPI Pt returns for f/u of diabetes mellitus: DM type: Insulin-requiring type 2 Dx'ed: 2094 Complications: renal insufficiency Therapy: insulin since early 2018, and 2 oral meds. GDM: never DKA: never Severe hypoglycemia: never Pancreatitis: (GB) 3 times Other: She is retired; goal is to transition to oral meds; edema and renal insuff limit oral options Interval history: pt states she feels well in general.  no cbg record, but states cbg's vary from 130-200's.  It is in general higher as the day goes on.   Past Medical History:  Diagnosis Date  . Acid reflux disease   . Acute kidney injury ([place])   . Anxiety   . Depression   . Diabetes mellitus without complication ([place])   . Dizziness   . Falls   . Hydrocephalus   . Hypertension   . Hypotension   . MVP (mitral valve prolapse)   . Sinus bradycardia   . Transaminitis     Past Surgical History:  Procedure Laterality Date  . BRAIN SURGERY    . CHOLECYSTECTOMY    . ERCP N/A 02/28/2014   Procedure: ENDOSCOPIC RETROGRADE CHOLANGIOPANCREATOGRAPHY (ERCP);  Surgeon: Gatha Mayer, MD;  [facility] ENDOSCOPY;  Service: Endoscopy;  Laterality: N/A;  talked to tiffany from or /ebp  . ESOPHAGOGASTRODUODENOSCOPY N/A 02/28/2014   Procedure: ESOPHAGOGASTRODUODENOSCOPY (EGD);  Surgeon: Gatha Mayer, MD;  Location: [facility] ENDOSCOPY;  Service: Endoscopy;  Laterality: N/A;  . VENTRICULOPERITONEAL SHUNT     right occipital    Social History   Social History  . Marital status: Divorced    Spouse name: N/A  . Number of children: N/A  . Years of education: N/A   Occupational History  . Not on file.   Social History Main Topics  . Smoking status: Never Smoker  . Smokeless tobacco: Never Used  . Alcohol use No  . Drug use: No  . Sexual activity: Not Currently   Other Topics Concern  . Not on file   Social History Narrative  . No  narrative on file    Current Outpatient Prescriptions on File Prior to Visit  Medication Sig Dispense Refill  . BYSTOLIC 10 MG tablet TAKE 1 TABLET BY MOUTH EVERY DAY 90 tablet 1  . Cholecalciferol 2000 units TABS Take 1 tablet (2,000 Units total) by mouth daily. 90 tablet 3  . cyanocobalamin 2000 MCG tablet Take 1 tablet (2,000 mcg total) by mouth daily. 90 tablet 3  . Fexofenadine HCl (MUCINEX ALLERGY PO) Take by mouth.    Marland Kitchen glucose blood test strip Use TID 100 each 11  . Insulin Pen Needle (NOVOFINE) 32G X 6 MM MISC 1 Act by Does not apply route daily. 100 each 3  . INVOKANA 100 MG TABS tablet TAKE 1 TABLET (100 MG TOTAL) BY MOUTH DAILY BEFORE BREAKFAST. 90 tablet 1  . JANUVIA 100 MG tablet TAKE 1 TABLET (100 MG TOTAL) BY MOUTH DAILY. 90 tablet 1  . MELATONIN ER PO Take by mouth.    . pantoprazole (PROTONIX) 40 MG tablet TAKE 1 TABLET EVERY DAY 90 tablet 3  . repaglinide (PRANDIN) 2 MG tablet Take 1 tablet (2 mg total) by mouth 3 (three) times daily before meals. 90 tablet 11  . sertraline (ZOLOFT) 100 MG tablet Take 100 mg by mouth daily.  12  . telmisartan-hydrochlorothiazide (MICARDIS HCT) 80-12.5 MG tablet Take 1 tablet by mouth  daily. 90 tablet 1  . tizanidine (ZANAFLEX) 2 MG capsule TAKE ONE CAPSULE 3 TIMES A DAY 270 capsule 1  . topiramate (TOPAMAX) 25 MG capsule Take 1 capsule (25 mg total) by mouth 2 (two) times daily. 60 capsule 2  . traZODone (DESYREL) 50 MG tablet Reported on 08/07/2015  3   No current facility-administered medications on file prior to visit.     Allergies  Allergen Reactions  . Metformin And Related Diarrhea    (takes metformin at home w/ meals)  . Ace Inhibitors Other (See Comments)    unknown  . Erythromycin Hives  . Penicillins Hives    Family History  Problem Relation Age of Onset  . Heart disease Mother   . Stroke Mother   . Hypertension Mother   . Alcohol abuse Father   . Diabetes Father   . Cancer Neg Hx     BP 138/84   Pulse 78    Ht 5\' 5"  (1.651 m)   Wt 252 lb (114.3 kg)   SpO2 95%   BMI 41.93 kg/m   Review of Systems She denies hypoglycemia    Objective:   Physical Exam VITAL SIGNS:  See vs page GENERAL: no distress Pulses: dorsalis pedis intact bilat.   MSK: no deformity of the feet CV: trace bilat leg edema Skin:  no ulcer on the feet.  normal color and temp on the feet. Neuro: sensation is intact to touch on the feet   Lab Results  Component Value Date   HGBA1C 7.2 12/16/2016    Lab Results  Component Value Date   CREATININE 1.24 (H) 10/13/2016   BUN 30 (H) 10/13/2016   NA 137 10/13/2016   K 4.8 10/13/2016   CL 101 10/13/2016   CO2 27 10/13/2016       Assessment & Plan:  Type 2 DM: she is ready to continue transition to oral rx Edema: this limits rx options Renal insuff: this also limits rx options.  H/o pancreatitis: as this was due to GB, she can take [place].  Patient Instructions  check your blood sugar 4 times a day.  vary the time of day when you check, between before the 3 meals, and at bedtime.  also check if you have symptoms of your blood sugar being too high or too low.  please keep a record of the readings and bring it to your next appointment here (or you can bring the meter itself).  You can write it on any piece of paper.  please call us sooner if your blood sugar goes below 70, or if you have a lot of readings over 200.   for now, please: Reduce the insulin to 10 units each morning, and: Start "bromocriptine."  I have sent a prescription to your pharmacy, and: Please continue the same other diabetes medications.   Please come back for a follow-up appointment in 2 weeks.

## 2016-12-16 NOTE — Progress Notes (Signed)
Diabetes Self-Management Education  Visit Type: First/Initial  Appt. Start Time: 1330 Appt. End Time: 1430  12/16/2016  [name], identified by name and date of birth, is a 71 y.o. female with a diagnosis of Diabetes: Type 2. Other hx includes HTN, CKD, HOH (wears hearing aides), and a hx of hydrocephalous with brain sugary in 2011.  Since the brain surgery, she has had some balance issues.  She also shattered a knee as a child and this also limits her exercise.  She has had substantial fatigue recently.  Weight gain from 220 lbs to 252 lbs in the past 4 months.  She would like to get off of insulin. Medications include Invokana, Januvia, Prandin, and Toujeo which has been decreased to 10 units.    Patient lives alone.  She has been feeling very lonely recently.  She has stopped cooking and relies most on TV dinners.  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  ASSESSMENT  Height 5\' 5"  (1.651 m), weight 252 lb (114.3 kg). Body mass index is 41.93 kg/m.      Diabetes Self-Management Education - 12/16/16 1327      Visit Information   Visit Type First/Initial     Initial Visit   Diabetes Type Type 2   Are you currently following a meal plan? No   Are you taking your medications as prescribed? Yes   Date Diagnosed 2017     Health Coping   How would you rate your overall health? Fair     Psychosocial Assessment   Patient Belief/Attitude about Diabetes Motivated to manage diabetes   Self-care barriers Hard of hearing   Self-management support Doctor's office   Other persons present Patient   Patient Concerns Nutrition/Meal planning;Glycemic Control   Special Needs None   Preferred Learning Style No preference indicated   Learning Readiness Ready   How often do you need to have someone help you when you read instructions, pamphlets, or other written materials from your doctor or pharmacy? 1 - Never   What is the last grade level you completed in school? some  college     Pre-Education Assessment   Patient understands the diabetes disease and treatment process. Needs Instruction   Patient understands incorporating nutritional management into lifestyle. Needs Instruction   Patient undertands incorporating physical activity into lifestyle. Needs Instruction   Patient understands using medications safely. Needs Instruction   Patient understands monitoring blood glucose, interpreting and using results Needs Instruction   Patient understands prevention, detection, and treatment of acute complications. Needs Instruction   Patient understands prevention, detection, and treatment of chronic complications. Needs Instruction   Patient understands how to develop strategies to address psychosocial issues. Needs Instruction   Patient understands how to develop strategies to promote health/change behavior. Needs Instruction     Complications   Last HgB A1C per patient/outside source 7.2 %  12/16/16 decreased from 8.5% in March 2018   How often do you check your blood sugar? 1-2 times/day   Postprandial Blood glucose range (mg/dL) >200;130-179;180-200   Number of hypoglycemic episodes per month 1   Can you tell when your blood sugar is low? Yes   What do you do if your blood sugar is low? eat or drink something with sugar   Number of hyperglycemic episodes per week 7   Can you tell when your blood sugar is high? No   Have you had a dilated eye exam in the past 12 months? Yes  Have you had a dental exam in the past 12 months? Yes   Are you checking your feet? No     Dietary Intake   Breakfast Jimmie Dean Breakfast bowl (2 eggs, cheese, sausage)  8-10   Snack (morning) none   Lunch sandwich (meat, tomato, mayo on white bread)   Snack (afternoon) none   Dinner [employer] or [employer] frozen meal  8   Snack (evening) pringles   Beverage(s) water,  regular lemonade (20 ounces)  regular Pepsi (small bottle)     Exercise   Exercise Type ADL's   How many  days per week to you exercise? 0   How many minutes per day do you exercise? 0   Total minutes per week of exercise 0     Patient Education   Previous Diabetes Education No   Disease state  Definition of diabetes, type 1 and 2, and the diagnosis of diabetes   Nutrition management  Role of diet in the treatment of diabetes and the relationship between the three main macronutrients and blood glucose level;Food label reading, portion sizes and measuring food.;Meal options for control of blood glucose level and chronic complications.;Meal timing in regards to the patients' current diabetes medication.   Physical activity and exercise  Role of exercise on diabetes management, blood pressure control and cardiac health.;Helped patient identify appropriate exercises in relation to his/her diabetes, diabetes complications and other health issue.   Medications Reviewed patients medication for diabetes, action, purpose, timing of dose and side effects.   Monitoring Purpose and frequency of SMBG.   Acute complications Taught treatment of hypoglycemia - the 15 rule.   Chronic complications Relationship between chronic complications and blood glucose control;Assessed and discussed foot care and prevention of foot problems;Dental care;Retinopathy and reason for yearly dilated eye exams   Psychosocial adjustment Worked with patient to identify barriers to care and solutions   Personal strategies to promote health Lifestyle issues that need to be addressed for better diabetes care     Individualized Goals (developed by patient)   Nutrition General guidelines for healthy choices and portions discussed   Physical Activity Exercise 3-5 times per week;15 minutes per day   Medications take my medication as prescribed   Monitoring  test my blood glucose as discussed   Problem Solving alternative beverage choices, forming habit for blood sugar tessting   Reducing Risk examine blood glucose patterns   Health Coping  discuss diabetes with (comment)  MD/RD     Post-Education Assessment   Patient understands the diabetes disease and treatment process. Demonstrates understanding / competency   Patient understands incorporating nutritional management into lifestyle. Demonstrates understanding / competency   Patient undertands incorporating physical activity into lifestyle. Demonstrates understanding / competency   Patient understands using medications safely. Demonstrates understanding / competency   Patient understands monitoring blood glucose, interpreting and using results Demonstrates understanding / competency   Patient understands prevention, detection, and treatment of acute complications. Demonstrates understanding / competency   Patient understands prevention, detection, and treatment of chronic complications. Demonstrates understanding / competency   Patient understands how to develop strategies to address psychosocial issues. Demonstrates understanding / competency   Patient understands how to develop strategies to promote health/change behavior. Demonstrates understanding / competency     Outcomes   Expected Outcomes Demonstrated interest in learning. Expect positive outcomes   Future DMSE PRN   Program Status Completed      Individualized Plan for Diabetes Self-Management Training:   Learning Objective:  Patient will have a greater understanding of diabetes self-management. Patient education plan is to attend individual and/or group sessions per assessed needs and concerns.   Plan:   Patient Instructions  Rethink what you drink.  Aim for beverages without carbohydrates such as unsweetened tea and water. Try to stay active (armchair exercises, [employer] pool or biking). Be mindful about your food choices.   Eat slowly and stop when you are satisfied. Consider eating away from the TV. Increase your non starchy vegetable intake.  Aim for 2-3 Carb Choices per meal (30-45 grams) +/- 1 either  way  Aim for 0-1 Carbs per snack if hungry  Include protein in moderation with your meals and snacks Consider reading food labels for Total Carbohydrate and Fat Grams of foods Consider checking BG at alternate times per day as directed by MD  Continue taking medication as directed by MD       Expected Outcomes:  Demonstrated interest in learning. Expect positive outcomes  Education material provided: Living Well with Diabetes, Food label handouts, A1C conversion sheet, Meal plan card, My Plate and Snack sheet  If problems or questions, patient to contact team via:  Phone  Future DSME appointment: PRN

## 2016-12-17 ENCOUNTER — Telehealth: Payer: Self-pay | Admitting: Dietician

## 2016-12-17 NOTE — Telephone Encounter (Signed)
Brief Nutrition Note: Patient called to request a brief 5-10 minute meeting.  Time arranged. Antonieta Iba, RD, LDN

## 2016-12-20 ENCOUNTER — Other Ambulatory Visit: Payer: Self-pay

## 2016-12-20 ENCOUNTER — Telehealth: Payer: Self-pay

## 2016-12-20 NOTE — Telephone Encounter (Signed)
Patient came into the office today and stated that she has excessive fatigue for the last 2 weeks. She has been taking several naps lasting about 2 hours each per day and sleeps all night. She has not started the new medication bromocriptine.   Do you have advice for patient?

## 2016-12-21 ENCOUNTER — Other Ambulatory Visit: Payer: Self-pay | Admitting: Internal Medicine

## 2016-12-21 DIAGNOSIS — G4719 Other hypersomnia: Secondary | ICD-10-CM | POA: Insufficient documentation

## 2016-12-21 NOTE — Telephone Encounter (Signed)
Pt informed of referral

## 2016-12-21 NOTE — Telephone Encounter (Signed)
I have sent a referral for her to be evaluated for a sleep disorder

## 2017-01-14 ENCOUNTER — Other Ambulatory Visit: Payer: Self-pay | Admitting: Internal Medicine

## 2017-01-14 DIAGNOSIS — G43001 Migraine without aura, not intractable, with status migrainosus: Secondary | ICD-10-CM

## 2017-01-19 ENCOUNTER — Encounter: Payer: Self-pay | Admitting: Internal Medicine

## 2017-01-19 ENCOUNTER — Other Ambulatory Visit (INDEPENDENT_AMBULATORY_CARE_PROVIDER_SITE_OTHER): Payer: Medicare Other

## 2017-01-19 ENCOUNTER — Ambulatory Visit (INDEPENDENT_AMBULATORY_CARE_PROVIDER_SITE_OTHER): Payer: Medicare Other | Admitting: Internal Medicine

## 2017-01-19 VITALS — BP 120/70 | HR 68 | Temp 98.0°F | Resp 16 | Ht 65.0 in | Wt 251.8 lb

## 2017-01-19 DIAGNOSIS — I10 Essential (primary) hypertension: Secondary | ICD-10-CM

## 2017-01-19 DIAGNOSIS — L270 Generalized skin eruption due to drugs and medicaments taken internally: Secondary | ICD-10-CM | POA: Diagnosis not present

## 2017-01-19 DIAGNOSIS — M791 Myalgia, unspecified site: Secondary | ICD-10-CM

## 2017-01-19 LAB — CBC WITH DIFFERENTIAL/PLATELET
Basophils Absolute: 0.1 10*3/uL (ref 0.0–0.1)
Basophils Relative: 0.9 % (ref 0.0–3.0)
Eosinophils Absolute: 0.6 10*3/uL (ref 0.0–0.7)
Eosinophils Relative: 7.4 % — ABNORMAL HIGH (ref 0.0–5.0)
HCT: 41.3 % (ref 36.0–46.0)
Hemoglobin: 14.2 g/dL (ref 12.0–15.0)
Lymphocytes Relative: 9.8 % — ABNORMAL LOW (ref 12.0–46.0)
Lymphs Abs: 0.8 10*3/uL (ref 0.7–4.0)
MCHC: 34.4 g/dL (ref 30.0–36.0)
MCV: 89.6 fl (ref 78.0–100.0)
Monocytes Absolute: 0.6 10*3/uL (ref 0.1–1.0)
Monocytes Relative: 6.6 % (ref 3.0–12.0)
Neutro Abs: 6.4 10*3/uL (ref 1.4–7.7)
Neutrophils Relative %: 75.3 % (ref 43.0–77.0)
Platelets: 235 10*3/uL (ref 150.0–400.0)
RBC: 4.61 Mil/uL (ref 3.87–5.11)
RDW: 13.1 % (ref 11.5–15.5)
WBC: 8.5 10*3/uL (ref 4.0–10.5)

## 2017-01-19 LAB — COMPREHENSIVE METABOLIC PANEL
ALT: 11 U/L (ref 0–35)
AST: 11 U/L (ref 0–37)
Albumin: 4.1 g/dL (ref 3.5–5.2)
Alkaline Phosphatase: 67 U/L (ref 39–117)
BUN: 36 mg/dL — ABNORMAL HIGH (ref 6–23)
CO2: 26 mEq/L (ref 19–32)
Calcium: 9.9 mg/dL (ref 8.4–10.5)
Chloride: 103 mEq/L (ref 96–112)
Creatinine, Ser: 1.18 mg/dL (ref 0.40–1.20)
GFR: 48.14 mL/min — ABNORMAL LOW (ref >60.00)
Glucose, Bld: 172 mg/dL — ABNORMAL HIGH (ref 70–99)
Potassium: 3.5 mEq/L (ref 3.5–5.1)
Sodium: 138 mEq/L (ref 135–145)
Total Bilirubin: 0.5 mg/dL (ref 0.2–1.2)
Total Protein: 7.7 g/dL (ref 6.0–8.3)

## 2017-01-19 LAB — CK: Total CK: 70 U/L (ref 7–177)

## 2017-01-19 LAB — SEDIMENTATION RATE: Sed Rate: 60 mm/hr — ABNORMAL HIGH (ref 0–30)

## 2017-01-19 MED ORDER — METHYLPREDNISOLONE 4 MG PO TBPK: ORAL_TABLET | ORAL | 0 refills | Status: DC

## 2017-01-19 NOTE — Progress Notes (Signed)
Subjective:  Patient ID: [name], female    DOB: February 07, 1946  Age: 71 y.o. MRN: [id number]  CC: Rash   HPI ASTOU LADA presents for A 1 day history of asymptomatic rash. She had not taken Topamax for several weeks and then the night prior to this visit developed a headache so she started taking Topamax again. Soon thereafter she noticed a pink rash over her torso and extremities. She says the rash does not itch, burn, or sting. She has no involvement of her eyes, mouth, throat, palms, or soles.  She also complains of a three-week history of muscle aches that she describes from her shoulders to her feet. She says the muscle aches are not exacerbated by activity or movement. She denies muscle weakness, paresthesias, or ataxia.  Outpatient Medications Prior to Visit  Medication Sig Dispense Refill  . bromocriptine (PARLODEL) 2.5 MG tablet 1/4 tab daily 8 tablet 11  . BYSTOLIC 10 MG tablet TAKE 1 TABLET BY MOUTH EVERY DAY 90 tablet 1  . Cholecalciferol 2000 units TABS Take 1 tablet (2,000 Units total) by mouth daily. 90 tablet 3  . cyanocobalamin 2000 MCG tablet Take 1 tablet (2,000 mcg total) by mouth daily. 90 tablet 3  . Fexofenadine HCl (MUCINEX ALLERGY PO) Take by mouth.    Marland Kitchen glucose blood test strip Use TID 100 each 11  . Insulin Glargine (TOUJEO SOLOSTAR) 300 UNIT/ML SOPN Inject 10 Units into the skin daily. 1.5 mL 11  . Insulin Pen Needle (NOVOFINE) 32G X 6 MM MISC 1 Act by Does not apply route daily. 100 each 3  . INVOKANA 100 MG TABS tablet TAKE 1 TABLET (100 MG TOTAL) BY MOUTH DAILY BEFORE BREAKFAST. 90 tablet 1  . JANUVIA 100 MG tablet TAKE 1 TABLET (100 MG TOTAL) BY MOUTH DAILY. 90 tablet 1  . pantoprazole (PROTONIX) 40 MG tablet TAKE 1 TABLET EVERY DAY 90 tablet 3  . repaglinide (PRANDIN) 2 MG tablet Take 1 tablet (2 mg total) by mouth 3 (three) times daily before meals. 90 tablet 11  . sertraline (ZOLOFT) 100 MG tablet Take 100 mg by mouth daily.  12  .  SUMAtriptan (IMITREX) 50 MG tablet TAKE 1 TABLET BY MOUTH AS NEEDED FOR MIGRAINE, CAN REPEAT IN 2 HOURS IF HEADACHE PERSISTS 10 tablet 5  . telmisartan-hydrochlorothiazide (MICARDIS HCT) 80-12.5 MG tablet Take 1 tablet by mouth daily. 90 tablet 1  . tizanidine (ZANAFLEX) 2 MG capsule TAKE ONE CAPSULE 3 TIMES A DAY 270 capsule 1  . traZODone (DESYREL) 50 MG tablet Reported on 08/07/2015  3  . topiramate (TOPAMAX) 25 MG capsule Take 1 capsule (25 mg total) by mouth 2 (two) times daily. 60 capsule 2   No facility-administered medications prior to visit.     ROS Review of Systems  Constitutional: Negative.  Negative for activity change, appetite change, diaphoresis, fatigue and unexpected weight change.  HENT: Negative.  Negative for mouth sores, sinus pressure, sore throat and trouble swallowing.   Eyes: Negative for visual disturbance.  Respiratory: Negative.  Negative for cough, chest tightness, shortness of breath and wheezing.   Cardiovascular: Negative.  Negative for chest pain, palpitations and leg swelling.  Gastrointestinal: Negative for abdominal pain, constipation, diarrhea, nausea and vomiting.  Endocrine: Negative.   Genitourinary: Negative.  Negative for difficulty urinating.  Musculoskeletal: Positive for myalgias. Negative for back pain and neck stiffness.  Skin: Positive for rash. Negative for color change, pallor and wound.  Allergic/Immunologic: Negative.   Neurological: Negative.  Negative for dizziness, weakness, numbness and headaches.  Hematological: Negative.  Negative for adenopathy. Does not bruise/bleed easily.  Psychiatric/Behavioral: Negative.     Objective:  BP 120/70 (BP Location: Left Arm, Patient Position: Sitting, Cuff Size: Large)   Pulse 68   Temp 98 F (36.7 C) (Oral)   Resp 16   Ht 5\' 5"  (1.651 m)   Wt 251 lb 12 oz (114.2 kg)   SpO2 96%   BMI 41.89 kg/m   BP Readings from Last 3 Encounters:  01/19/17 120/70  12/16/16 138/84  11/30/16 (!)  158/98    Wt Readings from Last 3 Encounters:  01/19/17 251 lb 12 oz (114.2 kg)  12/16/16 252 lb (114.3 kg)  12/16/16 252 lb (114.3 kg)    Physical Exam  Constitutional: She is oriented to person, place, and time.  Non-toxic appearance. She does not have a sickly appearance. She does not appear ill. No distress.  HENT:  Mouth/Throat: Oropharynx is clear and moist. No oropharyngeal exudate.  Eyes: Conjunctivae are normal. Right eye exhibits no discharge. Left eye exhibits no discharge. No scleral icterus.  Neck: Normal range of motion. Neck supple. No JVD present. No thyromegaly present.  Cardiovascular: Normal rate, regular rhythm and intact distal pulses.  Exam reveals no gallop and no friction rub.   No murmur heard. Pulmonary/Chest: Effort normal and breath sounds normal. No respiratory distress. She has no wheezes. She has no rales. She exhibits no tenderness.  Abdominal: Soft. Bowel sounds are normal. She exhibits no distension and no mass. There is no tenderness. There is no rebound and no guarding.  Musculoskeletal: Normal range of motion. She exhibits no edema, tenderness or deformity.  Lymphadenopathy:    She has no cervical adenopathy.  Neurological: She is alert and oriented to person, place, and time. She has normal reflexes. She displays normal reflexes. No cranial nerve deficit. She exhibits normal muscle tone. Coordination normal.  Skin: Skin is warm and dry. Rash noted. Rash is maculopapular. She is not diaphoretic. No erythema. No pallor.  There is a faint, slightly erythematous, blanching, maculopapular rash diffusely and symmetrically over her torso and upper and lower extremities. There is no involvement of the palms or soles and there is no involvement of the mucous membranes.  Vitals reviewed.   Lab Results  Component Value Date   WBC 8.5 01/19/2017   HGB 14.2 01/19/2017   HCT 41.3 01/19/2017   PLT 235.0 01/19/2017   GLUCOSE 172 (H) 01/19/2017   CHOL 225 (H)  05/11/2016   TRIG 198.0 (H) 05/11/2016   HDL 61.00 05/11/2016   LDLDIRECT 105.0 08/07/2015   LDLCALC 124 (H) 05/11/2016   ALT 11 01/19/2017   AST 11 01/19/2017   NA 138 01/19/2017   K 3.5 01/19/2017   CL 103 01/19/2017   CREATININE 1.18 01/19/2017   BUN 36 (H) 01/19/2017   CO2 26 01/19/2017   TSH 2.00 05/11/2016   INR 1.33 07/25/2014   HGBA1C 7.2 12/16/2016   MICROALBUR <0.7 05/11/2016    Dg Mandible 4 Views  Result Date: 07/07/2016 CLINICAL DATA:  Right lower jaw pain. EXAM: MANDIBLE - 4+ VIEW COMPARISON:  None. FINDINGS: There is no evidence of fracture or other focal bone lesions. Right ventriculostomy catheter directed towards midline. IMPRESSION: Negative. Electronically Signed   By: Kathreen Devoid   On: 07/07/2016 13:28    Assessment & Plan:   Aulani was seen today for rash.  Diagnoses and all orders for this visit:  Allergic drug rash-  The timing and symptoms are consistent with topiramate may related rash so I've asked her to stop taking it, will treat this with a course of systemic steroids. -     methylPREDNISolone (MEDROL DOSEPAK) 4 MG TBPK tablet; TAKE AS DIRECTED -     Sedimentation rate; Future  Essential hypertension- her blood pressure is well-controlled, electrolytes and renal function are normal. -     Comprehensive metabolic panel; Future -     CBC with Differential/Platelet; Future  Myalgia- her labs are negative for any evidence of inflammatory process or myositis. She will let me know if her symptoms improve with a course of steroids and I will monitor her for illnesses such as PMR. -     Comprehensive metabolic panel; Future -     CBC with Differential/Platelet; Future -     Sedimentation rate; Future -     CK; Future   I have discontinued Ms. Cinnamon's topiramate. I am also having her start on methylPREDNISolone. Additionally, I am having her maintain her traZODone, Cholecalciferol, cyanocobalamin, sertraline, Fexofenadine HCl (MUCINEX ALLERGY PO),  JANUVIA, INVOKANA, telmisartan-hydrochlorothiazide, glucose blood, Insulin Pen Needle, tizanidine, repaglinide, BYSTOLIC, pantoprazole, Insulin Glargine, bromocriptine, and SUMAtriptan.  Meds ordered this encounter  Medications  . methylPREDNISolone (MEDROL DOSEPAK) 4 MG TBPK tablet    Sig: TAKE AS DIRECTED    Dispense:  21 tablet    Refill:  0     Follow-up: Return in about 3 weeks (around 02/09/2017).  [name], MD

## 2017-01-19 NOTE — Patient Instructions (Signed)
Rash A rash is a change in the color of the skin. A rash can also change the way your skin feels. There are many different conditions and factors that can cause a rash. Follow these instructions at home: Pay attention to any changes in your symptoms. Follow these instructions to help with your condition: Medicine Take or apply over-the-counter and prescription medicines only as told by your health care provider. These may include:  Corticosteroid cream.  Anti-itch lotions.  Oral antihistamines.  Skin Care  Apply cool compresses to the affected areas.  Try taking a bath with: ? Epsom salts. Follow the instructions on the packaging. You can get these at your local pharmacy or grocery store. ? Baking soda. Pour a small amount into the bath as told by your health care provider. ? Colloidal oatmeal. Follow the instructions on the packaging. You can get this at your local pharmacy or grocery store.  Try applying baking soda paste to your skin. Stir water into baking soda until it reaches a paste-like consistency.  Do not scratch or rub your skin.  Avoid covering the rash. Make sure the rash is exposed to air as much as possible. General instructions  Avoid hot showers or baths, which can make itching worse. A cold shower may help.  Avoid scented soaps, detergents, and perfumes. Use gentle soaps, detergents, perfumes, and other cosmetic products.  Avoid any substance that causes your rash. Keep a journal to help track what causes your rash. Write down: ? What you eat. ? What cosmetic products you use. ? What you drink. ? What you wear. This includes jewelry.  Keep all follow-up visits as told by your health care provider. This is important. Contact a health care provider if:  You sweat at night.  You lose weight.  You urinate more than normal.  You feel weak.  You vomit.  Your skin or the whites of your eyes look yellow (jaundice).  Your skin: ? Tingles. ? Is  numb.  Your rash: ? Does not go away after several days. ? Gets worse.  You are: ? Unusually thirsty. ? More tired than normal.  You have: ? New symptoms. ? Pain in your abdomen. ? A fever. ? Diarrhea. Get help right away if:  You develop a rash that covers all or most of your body. The rash may or may not be painful.  You develop blisters that: ? Are on top of the rash. ? Grow larger or grow together. ? Are painful. ? Are inside your nose or mouth.  You develop a rash that: ? Looks like purple pinprick-sized spots all over your body. ? Has a "bull's eye" or looks like a target. ? Is not related to sun exposure, is red and painful, and causes your skin to peel. This information is not intended to replace advice given to you by your health care provider. Make sure you discuss any questions you have with your health care provider. Document Released: 07/09/2002 Document Revised: 12/23/2015 Document Reviewed: 12/04/2014 Elsevier Interactive Patient Education  2017 [employer] Inc[employer]  

## 2017-01-25 ENCOUNTER — Other Ambulatory Visit: Payer: Self-pay | Admitting: Internal Medicine

## 2017-01-25 DIAGNOSIS — I519 Heart disease, unspecified: Secondary | ICD-10-CM

## 2017-01-25 DIAGNOSIS — I5189 Other ill-defined heart diseases: Secondary | ICD-10-CM

## 2017-01-25 DIAGNOSIS — I10 Essential (primary) hypertension: Secondary | ICD-10-CM

## 2017-02-09 ENCOUNTER — Ambulatory Visit (INDEPENDENT_AMBULATORY_CARE_PROVIDER_SITE_OTHER): Payer: Medicare Other | Admitting: Internal Medicine

## 2017-02-09 ENCOUNTER — Encounter: Payer: Self-pay | Admitting: Internal Medicine

## 2017-02-09 VITALS — BP 148/80 | HR 78 | Temp 99.3°F | Resp 16 | Ht 65.0 in | Wt 255.0 lb

## 2017-02-09 DIAGNOSIS — E669 Obesity, unspecified: Secondary | ICD-10-CM | POA: Diagnosis not present

## 2017-02-09 DIAGNOSIS — Z1211 Encounter for screening for malignant neoplasm of colon: Secondary | ICD-10-CM

## 2017-02-09 DIAGNOSIS — L270 Generalized skin eruption due to drugs and medicaments taken internally: Secondary | ICD-10-CM

## 2017-02-09 DIAGNOSIS — I10 Essential (primary) hypertension: Secondary | ICD-10-CM | POA: Diagnosis not present

## 2017-02-09 NOTE — Patient Instructions (Signed)

## 2017-02-09 NOTE — Progress Notes (Signed)
Subjective:  Patient ID: [name], female    DOB: 03-10-1946  Age: 71 y.o. MRN: [id number]  CC: Rash   HPI [name] presents for f/up after a recent visit for a rash - the rash has resolved, she feels well and offers no complaints today.  Outpatient Medications Prior to Visit  Medication Sig Dispense Refill  . bromocriptine (PARLODEL) 2.5 MG tablet 1/4 tab daily 8 tablet 11  . BYSTOLIC 10 MG tablet TAKE 1 TABLET BY MOUTH EVERY DAY 90 tablet 1  . Cholecalciferol 2000 units TABS Take 1 tablet (2,000 Units total) by mouth daily. 90 tablet 3  . cyanocobalamin 2000 MCG tablet Take 1 tablet (2,000 mcg total) by mouth daily. 90 tablet 3  . Fexofenadine HCl (MUCINEX ALLERGY PO) Take by mouth.    Marland Kitchen glucose blood test strip Use TID 100 each 11  . Insulin Glargine (TOUJEO SOLOSTAR) 300 UNIT/ML SOPN Inject 10 Units into the skin daily. 1.5 mL 11  . Insulin Pen Needle (NOVOFINE) 32G X 6 MM MISC 1 Act by Does not apply route daily. 100 each 3  . INVOKANA 100 MG TABS tablet TAKE 1 TABLET (100 MG TOTAL) BY MOUTH DAILY BEFORE BREAKFAST. 90 tablet 1  . JANUVIA 100 MG tablet TAKE 1 TABLET (100 MG TOTAL) BY MOUTH DAILY. 90 tablet 1  . pantoprazole (PROTONIX) 40 MG tablet TAKE 1 TABLET EVERY DAY 90 tablet 3  . repaglinide (PRANDIN) 2 MG tablet Take 1 tablet (2 mg total) by mouth 3 (three) times daily before meals. 90 tablet 11  . sertraline (ZOLOFT) 100 MG tablet Take 100 mg by mouth daily.  12  . SUMAtriptan (IMITREX) 50 MG tablet TAKE 1 TABLET BY MOUTH AS NEEDED FOR MIGRAINE, CAN REPEAT IN 2 HOURS IF HEADACHE PERSISTS 10 tablet 5  . telmisartan-hydrochlorothiazide (MICARDIS HCT) 80-12.5 MG tablet TAKE 1 TABLET BY MOUTH EVERY DAY 90 tablet 1  . tizanidine (ZANAFLEX) 2 MG capsule TAKE ONE CAPSULE 3 TIMES A DAY 270 capsule 1  . traZODone (DESYREL) 50 MG tablet Reported on 08/07/2015  3  . methylPREDNISolone (MEDROL DOSEPAK) 4 MG TBPK tablet TAKE AS DIRECTED 21 tablet 0   No  facility-administered medications prior to visit.     ROS Review of Systems  Constitutional: Negative.  Negative for appetite change, diaphoresis, fatigue and unexpected weight change.  HENT: Negative.   Eyes: Negative for visual disturbance.  Respiratory: Negative for cough, chest tightness, shortness of breath and wheezing.   Cardiovascular: Negative for chest pain, palpitations and leg swelling.  Gastrointestinal: Negative for abdominal pain, constipation, diarrhea, nausea and vomiting.  Endocrine: Negative.   Genitourinary: Negative.   Musculoskeletal: Negative.  Negative for back pain and myalgias.  Skin: Negative.   Allergic/Immunologic: Negative.   Neurological: Negative.  Negative for dizziness, weakness, light-headedness and headaches.  Hematological: Negative.  Negative for adenopathy. Does not bruise/bleed easily.  Psychiatric/Behavioral: Negative.     Objective:  BP (!) 148/80 (BP Location: Left Arm, Patient Position: Sitting, Cuff Size: Large)   Pulse 78   Temp 99.3 F (37.4 C) (Oral)   Resp 16   Ht 5\' 5"  (1.651 m)   Wt 255 lb (115.7 kg)   SpO2 99%   BMI 42.43 kg/m   BP Readings from Last 3 Encounters:  02/09/17 (!) 148/80  01/19/17 120/70  12/16/16 138/84    Wt Readings from Last 3 Encounters:  02/09/17 255 lb (115.7 kg)  01/19/17 251 lb 12 oz (114.2 kg)  12/16/16 252 lb (114.3 kg)    Physical Exam  Constitutional: She is oriented to person, place, and time. No distress.  HENT:  Mouth/Throat: Oropharynx is clear and moist. No oropharyngeal exudate.  Eyes: Conjunctivae are normal. Right eye exhibits no discharge. Left eye exhibits no discharge. No scleral icterus.  Neck: Normal range of motion. Neck supple. No JVD present. No thyromegaly present.  Cardiovascular: Normal rate, regular rhythm and intact distal pulses.  Exam reveals no gallop and no friction rub.   No murmur heard. Pulmonary/Chest: Effort normal and breath sounds normal. No respiratory  distress. She has no wheezes. She has no rales. She exhibits no tenderness.  Abdominal: Soft. Bowel sounds are normal. She exhibits no distension and no mass. There is no tenderness. There is no rebound and no guarding.  Musculoskeletal: Normal range of motion. She exhibits no edema or tenderness.  Lymphadenopathy:    She has no cervical adenopathy.  Neurological: She is alert and oriented to person, place, and time.  Skin: Skin is warm and dry. No rash noted. She is not diaphoretic. No erythema. No pallor.  Vitals reviewed.   Lab Results  Component Value Date   WBC 8.5 01/19/2017   HGB 14.2 01/19/2017   HCT 41.3 01/19/2017   PLT 235.0 01/19/2017   GLUCOSE 172 (H) 01/19/2017   CHOL 225 (H) 05/11/2016   TRIG 198.0 (H) 05/11/2016   HDL 61.00 05/11/2016   LDLDIRECT 105.0 08/07/2015   LDLCALC 124 (H) 05/11/2016   ALT 11 01/19/2017   AST 11 01/19/2017   NA 138 01/19/2017   K 3.5 01/19/2017   CL 103 01/19/2017   CREATININE 1.18 01/19/2017   BUN 36 (H) 01/19/2017   CO2 26 01/19/2017   TSH 2.00 05/11/2016   INR 1.33 07/25/2014   HGBA1C 7.2 12/16/2016   MICROALBUR <0.7 05/11/2016    Dg Mandible 4 Views  Result Date: 07/07/2016 CLINICAL DATA:  Right lower jaw pain. EXAM: MANDIBLE - 4+ VIEW COMPARISON:  None. FINDINGS: There is no evidence of fracture or other focal bone lesions. Right ventriculostomy catheter directed towards midline. IMPRESSION: Negative. Electronically Signed   By: Kathreen Devoid   On: 07/07/2016 13:28    Assessment & Plan:   [name] was seen today for rash.  Diagnoses and all orders for this visit:  Colon cancer screening -     Ambulatory referral to Gastroenterology  Obesity (BMI 30-39.9)- She agrees to work on her diet and exercise regimen to lose weight  Essential hypertension- her blood pressures adequately well-controlled  Allergic drug rash- this resolved after a course of methylprednisolone and the cessation of Topamax.   I have discontinued Ms.  Wolf's methylPREDNISolone. I am also having her maintain her traZODone, Cholecalciferol, cyanocobalamin, sertraline, Fexofenadine HCl (MUCINEX ALLERGY PO), JANUVIA, INVOKANA, glucose blood, Insulin Pen Needle, tizanidine, repaglinide, BYSTOLIC, pantoprazole, Insulin Glargine, bromocriptine, SUMAtriptan, and telmisartan-hydrochlorothiazide.  No orders of the defined types were placed in this encounter.    Follow-up: Return in about 3 months (around 05/12/2017).  [name], MD

## 2017-02-14 ENCOUNTER — Ambulatory Visit (INDEPENDENT_AMBULATORY_CARE_PROVIDER_SITE_OTHER): Payer: Medicare Other | Admitting: Podiatry

## 2017-02-14 ENCOUNTER — Encounter: Payer: Self-pay | Admitting: Podiatry

## 2017-02-14 DIAGNOSIS — Q828 Other specified congenital malformations of skin: Secondary | ICD-10-CM | POA: Diagnosis not present

## 2017-02-14 DIAGNOSIS — M7751 Other enthesopathy of right foot: Secondary | ICD-10-CM

## 2017-02-14 MED ORDER — BETAMETHASONE SOD PHOS & ACET 6 (3-3) MG/ML IJ SUSP: 3.0000 mg | Freq: Once | INTRAMUSCULAR | Status: DC

## 2017-02-14 NOTE — Progress Notes (Signed)
   Subjective: Patient presents to the office today for chief complaint of painful callus lesions of the feet. Patient states that the pain is ongoing and is affecting their ability to ambulate without pain. Patient presents today for further treatment and evaluation. Patient also complains of a deep aching pain around the fifth MPJ right foot  Objective:  Physical Exam General: Alert and oriented x3 in no acute distress  Dermatology: Hyperkeratotic lesion present on the plantar aspect of the fifth MPJ right foot. Pain on palpation with a central nucleated core noted.  Skin is warm, dry and supple bilateral lower extremities. Negative for open lesions or macerations.  Vascular: Palpable pedal pulses bilaterally. No edema or erythema noted. Capillary refill within normal limits.  Neurological: Epicritic and protective threshold grossly intact bilaterally.   Musculoskeletal Exam: Pain on palpation at the keratotic lesion noted. Pain on palpation also noted with range of motion to the fifth MPJ right foot Range of motion within normal limits bilateral. Muscle strength 5/5 in all groups bilateral.  Assessment: #1 porokeratosis sub-fifth MPJ right foot 2 #2 capsulitis fifth MPJ right foot  Plan of Care:  #1 Patient evaluated #2 Excisional debridement of keratoic lesion using a chisel blade was performed without incident.  #3 Treated area(s) with Salinocaine and dressed with light dressing. #4 injection 0.5 mL Celestone Soluspan injected in the patient's fifth MPJ right foot  #5 Patient is to return to the clinic PRN.   Edrick Kins, DPM [facility]  Dr. Edrick Kins, [address]                                        [place], [place] [address]                Office [phone]  Fax [phone]

## 2017-02-15 ENCOUNTER — Other Ambulatory Visit: Payer: Self-pay | Admitting: Internal Medicine

## 2017-02-15 DIAGNOSIS — E118 Type 2 diabetes mellitus with unspecified complications: Secondary | ICD-10-CM

## 2017-02-25 ENCOUNTER — Other Ambulatory Visit: Payer: Self-pay | Admitting: Internal Medicine

## 2017-02-25 DIAGNOSIS — G43001 Migraine without aura, not intractable, with status migrainosus: Secondary | ICD-10-CM

## 2017-02-28 ENCOUNTER — Encounter: Payer: Self-pay | Admitting: Gastroenterology

## 2017-04-17 ENCOUNTER — Other Ambulatory Visit: Payer: Self-pay | Admitting: Internal Medicine

## 2017-04-17 DIAGNOSIS — G43001 Migraine without aura, not intractable, with status migrainosus: Secondary | ICD-10-CM

## 2017-04-18 ENCOUNTER — Ambulatory Visit (INDEPENDENT_AMBULATORY_CARE_PROVIDER_SITE_OTHER): Payer: Medicare Other | Admitting: Podiatry

## 2017-04-18 ENCOUNTER — Encounter: Payer: Self-pay | Admitting: Podiatry

## 2017-04-18 DIAGNOSIS — Q828 Other specified congenital malformations of skin: Secondary | ICD-10-CM

## 2017-04-18 DIAGNOSIS — M7751 Other enthesopathy of right foot: Secondary | ICD-10-CM

## 2017-04-18 MED ORDER — BETAMETHASONE SOD PHOS & ACET 6 (3-3) MG/ML IJ SUSP: 3.0000 mg | Freq: Once | INTRAMUSCULAR | Status: DC

## 2017-04-19 ENCOUNTER — Other Ambulatory Visit: Payer: Self-pay | Admitting: Internal Medicine

## 2017-04-19 ENCOUNTER — Telehealth: Payer: Self-pay | Admitting: Podiatry

## 2017-04-19 ENCOUNTER — Telehealth: Payer: Self-pay

## 2017-04-19 DIAGNOSIS — M5412 Radiculopathy, cervical region: Secondary | ICD-10-CM

## 2017-04-19 MED ORDER — TIZANIDINE HCL 2 MG PO CAPS: ORAL_CAPSULE | ORAL | 1 refills | Status: DC

## 2017-04-19 NOTE — Telephone Encounter (Signed)
[facility] faxed rq to rf tizanidine 2 mg capsules.   LOV 01/2017, no follow up scheduled.   Please advise if she needs an appointment before refills are given.

## 2017-04-19 NOTE — Telephone Encounter (Signed)
RX sent

## 2017-04-19 NOTE — Telephone Encounter (Signed)
I saw Dr. Amalia Hailey today and he did some work on my right foot. I'm in a lot more pain than I expected to be. I was wondering if someone could call me back about that. My number is [phone]. Thank you.

## 2017-04-19 NOTE — Telephone Encounter (Signed)
I spoke with pt and she states she is much better, although she was not certain she would have been she hurt so bad. Pt states she went completely barefooted and went to bed early with the foot elevated and felt much better this morning.

## 2017-04-20 NOTE — Progress Notes (Signed)
   Subjective: Patient presents to the office today for follow up evaluation of painful callus lesions of the feet. Patient states that the pain is ongoing and is affecting their ability to ambulate without pain. She describes the pain as a burning sensation and states the pain is present in the evening. Patient presents today for further treatment and evaluation. Patient also complains of a deep aching pain around the fifth MPJ right foot  Objective:  Physical Exam General: Alert and oriented x3 in no acute distress  Dermatology: Hyperkeratotic lesion present on the plantar aspect of the fifth MPJ right foot. Pain on palpation with a central nucleated core noted. Skin is warm, dry and supple bilateral lower extremities. Negative for open lesions or macerations.  Vascular: Palpable pedal pulses bilaterally. No edema or erythema noted. Capillary refill within normal limits.  Neurological: Epicritic and protective threshold grossly intact bilaterally.   Musculoskeletal Exam: Pain on palpation at the keratotic lesion noted. Pain on palpation also noted with range of motion to the fifth MPJ right foot Range of motion within normal limits bilateral. Muscle strength 5/5 in all groups bilateral.  Assessment: #1 porokeratosis sub-fifth MPJ right foot 2 #2 capsulitis fifth MPJ right foot  Plan of Care:  #1 Patient evaluated. #2 Excisional debridement of keratoic lesion using a chisel blade was performed without incident.  #3 Dressed with light dressing. #4 injection 0.5 mL Celestone Soluspan injected in the patient's fifth MPJ right foot. #5 Patient is to return to the clinic in 4 weeks.   Edrick Kins, DPM [facility]  Dr. Edrick Kins, [address]                                        [place], [place] [address]                Office [phone]  Fax [phone]

## 2017-05-02 ENCOUNTER — Other Ambulatory Visit: Payer: Self-pay | Admitting: Internal Medicine

## 2017-05-02 DIAGNOSIS — G43001 Migraine without aura, not intractable, with status migrainosus: Secondary | ICD-10-CM

## 2017-05-09 ENCOUNTER — Ambulatory Visit (INDEPENDENT_AMBULATORY_CARE_PROVIDER_SITE_OTHER): Payer: Medicare Other | Admitting: Family Medicine

## 2017-05-09 ENCOUNTER — Encounter: Payer: Self-pay | Admitting: Family Medicine

## 2017-05-09 VITALS — BP 134/82 | HR 93 | Temp 98.4°F | Ht 65.0 in | Wt 254.1 lb

## 2017-05-09 DIAGNOSIS — M79652 Pain in left thigh: Secondary | ICD-10-CM | POA: Diagnosis not present

## 2017-05-09 NOTE — Patient Instructions (Signed)
Thank you for coming in,   Please let me know if your symptoms get worse.   Please try ice, elevation and compression.    Please feel free to call with any questions or concerns at any time, at [phone]. --Dr. Raeford Razor

## 2017-05-09 NOTE — Progress Notes (Addendum)
[name] - 71 y.o. female MRN [id number]  Date of birth: Mar 13, 1946  SUBJECTIVE:  Including CC & ROS.  Chief Complaint  Patient presents with  . Fall    Patient is here today after a fall.  DOI: 10.07.2018.  Bruising on left thigh and is pain is a 5 on a 10 point scale.    [name] is a 71 year old female is presenting with left lateral thigh pain. The pain occurred after she fell yesterday. She has a history of hydrocephalus status post shunt and has balance issues. She was walking and fell and hit at bedtime. She is only having pain if the bruised area is bumped. She denies any numbness or tingling. She has taken Tylenol for the pain. She feels like her patella may have dislocated. She has a history of previous patellar dislocation. The pain is localized. The pain is throbbing in nature. Does not take any anticoagulation.     Review of Systems  Constitutional: Negative for fever.  Musculoskeletal: Positive for gait problem. Negative for joint swelling.  Skin: Positive for color change.  Neurological: Negative for weakness and numbness.  Hematological: Negative for adenopathy.    HISTORY: Past Medical, Surgical, Social, and Family History Reviewed & Updated per EMR.   Pertinent Historical Findings include:  Past Medical History:  Diagnosis Date  . Acid reflux disease   . Acute kidney injury ([place])   . Anxiety   . Depression   . Diabetes mellitus without complication ([place])   . Dizziness   . Falls   . Hydrocephalus   . Hypertension   . Hypotension   . MVP (mitral valve prolapse)   . Sinus bradycardia   . Transaminitis     Past Surgical History:  Procedure Laterality Date  . BRAIN SURGERY    . CHOLECYSTECTOMY    . ERCP N/A 02/28/2014   Procedure: ENDOSCOPIC RETROGRADE CHOLANGIOPANCREATOGRAPHY (ERCP);  Surgeon: Gatha Mayer, MD;  Location: [facility] ENDOSCOPY;  Service: Endoscopy;  Laterality: N/A;  talked to tiffany from or /ebp  . ESOPHAGOGASTRODUODENOSCOPY N/A  02/28/2014   Procedure: ESOPHAGOGASTRODUODENOSCOPY (EGD);  Surgeon: Gatha Mayer, MD;  Location[facility] ENDOSCOPY;  Service: Endoscopy;  Laterality: N/A;  . VENTRICULOPERITONEAL SHUNT     right occipital    Allergies  Allergen Reactions  . Metformin And Related Diarrhea    (takes metformin at home w/ meals)  . Ace Inhibitors Other (See Comments)    unknown  . Erythromycin Hives  . Penicillins Hives  . Topamax [Topiramate] Rash    Family History  Problem Relation Age of Onset  . Heart disease Mother   . Stroke Mother   . Hypertension Mother   . Alcohol abuse Father   . Diabetes Father   . Cancer Neg Hx      Social History   Social History  . Marital status: Divorced    Spouse name: N/A  . Number of children: N/A  . Years of education: N/A   Occupational History  . Not on file.   Social History Main Topics  . Smoking status: Never Smoker  . Smokeless tobacco: Never Used  . Alcohol use No  . Drug use: No  . Sexual activity: Not Currently   Other Topics Concern  . Not on file   Social History Narrative  . No narrative on file     PHYSICAL EXAM:  VS: BP 134/82 (BP Location: Left Arm, Patient Position: Sitting, Cuff Size: Normal)   Pulse 93  Temp 98.4 F (36.9 C) (Oral)   Ht 5\' 5"  (1.651 m)   Wt 254 lb 1.3 oz (115.2 kg)   SpO2 96%   BMI 42.28 kg/m  Physical Exam Gen: NAD, alert, cooperative with exam, well-appearing ENT: normal lips, normal nasal mucosa,  Eye: normal EOM, normal conjunctiva and lids CV:  no edema, +2 pedal pulses   Resp: no accessory muscle use, non-labored,  GI: no masses or tenderness, no hernia  Skin: no rashes, no areas of induration  Neuro: normal tone, normal sensation to touch Psych:  normal insight, alert and oriented MSK:  Left thigh: Area of ecchymosis on the left lateral/posterior aspect of her thigh. This area is tenderness to palpate. Normal hip internal and external rotation. Limited knee extension to about 10.  This is normal for her. Normal strength to resistance with knee flexion and extension. Ambulating with cane. Neurovascularly intact  Limited ultrasound: Left thigh/knee:  There is no fluid in the suprapatellar pouch. No obvious hematoma development while scanning over the bruised area. Some soft tissue swelling  Summary: Soft tissue swelling of the left lateral thigh  Ultrasound and interpretation by Clearance Coots, MD           ASSESSMENT & PLAN:   Pain of left thigh Has an area of bruising where she had a fall and hit herself. Does not appear to have any hematoma development. Does not appear to have any patellar dislocation with no fluid within the suprapatellar pouch. Doesn't appear to be a fracture. - Advised ice and compression. - Can use ibuprofen or Tylenol for pain control - If no improvement advised to follow-up and consider x-ray and repeat ultrasound

## 2017-05-09 NOTE — Assessment & Plan Note (Signed)
Has an area of bruising where she had a fall and hit herself. Does not appear to have any hematoma development. Does not appear to have any patellar dislocation with no fluid within the suprapatellar pouch. Doesn't appear to be a fracture. - Advised ice and compression. - Can use ibuprofen or Tylenol for pain control - If no improvement advised to follow-up and consider x-ray and repeat ultrasound

## 2017-05-12 ENCOUNTER — Encounter: Payer: Self-pay | Admitting: Gastroenterology

## 2017-05-16 ENCOUNTER — Ambulatory Visit: Payer: Medicare Other | Admitting: Podiatry

## 2017-05-17 ENCOUNTER — Telehealth: Payer: Self-pay | Admitting: Internal Medicine

## 2017-05-17 NOTE — Telephone Encounter (Signed)
[name] called for the pt stating that she is not doing well with her diabetic diet. She is needing guidance and they did not know what source of resources are available to her. They would like for someone to come out to the home if that is an option. Please advise.

## 2017-05-18 NOTE — Telephone Encounter (Signed)
Sent an email to [place] to help with Diabetic education.

## 2017-05-20 NOTE — Telephone Encounter (Signed)
Called pt and confirmed that she does need help with some diabetic education. Pt stated that she does need as much help as possible.  [name] is coming in on 05/31/2017 at 10am to see pt and pt will see PCP right afterward.  [name] may have some additional information for patient as well.

## 2017-05-23 ENCOUNTER — Other Ambulatory Visit: Payer: Self-pay | Admitting: Internal Medicine

## 2017-05-23 DIAGNOSIS — I5189 Other ill-defined heart diseases: Secondary | ICD-10-CM

## 2017-05-23 DIAGNOSIS — I519 Heart disease, unspecified: Secondary | ICD-10-CM

## 2017-05-23 DIAGNOSIS — G43001 Migraine without aura, not intractable, with status migrainosus: Secondary | ICD-10-CM

## 2017-05-23 DIAGNOSIS — I10 Essential (primary) hypertension: Secondary | ICD-10-CM

## 2017-05-30 ENCOUNTER — Encounter: Payer: Self-pay | Admitting: Podiatry

## 2017-05-30 ENCOUNTER — Ambulatory Visit (INDEPENDENT_AMBULATORY_CARE_PROVIDER_SITE_OTHER): Payer: Medicare Other | Admitting: Podiatry

## 2017-05-30 DIAGNOSIS — M7751 Other enthesopathy of right foot: Secondary | ICD-10-CM | POA: Diagnosis not present

## 2017-05-30 DIAGNOSIS — L84 Corns and callosities: Secondary | ICD-10-CM

## 2017-05-31 ENCOUNTER — Encounter: Payer: Self-pay | Admitting: Internal Medicine

## 2017-05-31 ENCOUNTER — Telehealth: Payer: Self-pay

## 2017-05-31 ENCOUNTER — Other Ambulatory Visit (INDEPENDENT_AMBULATORY_CARE_PROVIDER_SITE_OTHER): Payer: Medicare Other

## 2017-05-31 ENCOUNTER — Ambulatory Visit (INDEPENDENT_AMBULATORY_CARE_PROVIDER_SITE_OTHER): Payer: Medicare Other | Admitting: Internal Medicine

## 2017-05-31 VITALS — BP 138/90 | HR 87 | Temp 98.3°F | Resp 16 | Ht 65.0 in | Wt 244.0 lb

## 2017-05-31 DIAGNOSIS — F5089 Other specified eating disorder: Secondary | ICD-10-CM

## 2017-05-31 DIAGNOSIS — E669 Obesity, unspecified: Secondary | ICD-10-CM | POA: Diagnosis not present

## 2017-05-31 DIAGNOSIS — E785 Hyperlipidemia, unspecified: Secondary | ICD-10-CM | POA: Diagnosis not present

## 2017-05-31 DIAGNOSIS — I10 Essential (primary) hypertension: Secondary | ICD-10-CM | POA: Diagnosis not present

## 2017-05-31 DIAGNOSIS — Z1231 Encounter for screening mammogram for malignant neoplasm of breast: Secondary | ICD-10-CM

## 2017-05-31 DIAGNOSIS — E118 Type 2 diabetes mellitus with unspecified complications: Secondary | ICD-10-CM

## 2017-05-31 DIAGNOSIS — Z0000 Encounter for general adult medical examination without abnormal findings: Secondary | ICD-10-CM

## 2017-05-31 LAB — URINALYSIS, ROUTINE W REFLEX MICROSCOPIC
Bilirubin Urine: NEGATIVE
Ketones, ur: NEGATIVE
Nitrite: POSITIVE — AB
Specific Gravity, Urine: 1.025 (ref 1.000–1.030)
Urine Glucose: >=1000 — AB
Urobilinogen, UA: 0.2 (ref 0.0–1.0)
pH: 6 (ref 5.0–8.0)

## 2017-05-31 LAB — BASIC METABOLIC PANEL
BUN: 24 mg/dL — ABNORMAL HIGH (ref 6–23)
CO2: 22 mEq/L (ref 19–32)
Calcium: 10 mg/dL (ref 8.4–10.5)
Chloride: 104 mEq/L (ref 96–112)
Creatinine, Ser: 1.11 mg/dL (ref 0.40–1.20)
GFR: 51.6 mL/min — ABNORMAL LOW (ref >60.00)
Glucose, Bld: 182 mg/dL — ABNORMAL HIGH (ref 70–99)
Potassium: 3.4 mEq/L — ABNORMAL LOW (ref 3.5–5.1)
Sodium: 137 mEq/L (ref 135–145)

## 2017-05-31 LAB — LIPID PANEL
Cholesterol: 186 mg/dL (ref 0–200)
HDL: 50 mg/dL (ref >39.00)
NonHDL: 136.05
Total CHOL/HDL Ratio: 4
Triglycerides: 230 mg/dL — ABNORMAL HIGH (ref 0.0–149.0)
VLDL: 46 mg/dL — ABNORMAL HIGH (ref 0.0–40.0)

## 2017-05-31 LAB — HEMOGLOBIN A1C: Hgb A1c MFr Bld: 6.9 % — ABNORMAL HIGH (ref 4.6–6.5)

## 2017-05-31 LAB — MICROALBUMIN / CREATININE URINE RATIO
Creatinine,U: 143.9 mg/dL
Microalb Creat Ratio: 2.3 mg/g (ref 0.0–30.0)
Microalb, Ur: 3.3 mg/dL — ABNORMAL HIGH (ref 0.0–1.9)

## 2017-05-31 LAB — POCT GLUCOSE (DEVICE FOR HOME USE): Glucose Fasting, POC: 173 mg/dL — AB (ref 70–99)

## 2017-05-31 LAB — POCT GLYCOSYLATED HEMOGLOBIN (HGB A1C): Hemoglobin A1C: 6.9

## 2017-05-31 LAB — LDL CHOLESTEROL, DIRECT: Direct LDL: 106 mg/dL

## 2017-05-31 NOTE — Progress Notes (Signed)
Subjective:  Patient ID: [name], female    DOB: 01/21/1946  Age: 71 y.o. MRN: [id number]  CC: Diabetes and Hypertension   HPI [name] presents for f/up - she feels well and offers no complaints.  She has not been taking Januvia or using Toujeo. She has been controlling her blood sugars with Invokana and Prandin.  Outpatient Medications Prior to Visit  Medication Sig Dispense Refill  . bromocriptine (PARLODEL) 2.5 MG tablet 1/4 tab daily 8 tablet 11  . BYSTOLIC 10 MG tablet TAKE 1 TABLET BY MOUTH EVERY DAY 90 tablet 1  . Cholecalciferol 2000 units TABS Take 1 tablet (2,000 Units total) by mouth daily. 90 tablet 3  . cyanocobalamin 2000 MCG tablet Take 1 tablet (2,000 mcg total) by mouth daily. 90 tablet 3  . Fexofenadine HCl (MUCINEX ALLERGY PO) Take by mouth.    Marland Kitchen glucose blood test strip Use TID 100 each 11  . Insulin Pen Needle (NOVOFINE) 32G X 6 MM MISC 1 Act by Does not apply route daily. 100 each 3  . pantoprazole (PROTONIX) 40 MG tablet TAKE 1 TABLET EVERY DAY 90 tablet 3  . repaglinide (PRANDIN) 2 MG tablet Take 1 tablet (2 mg total) by mouth 3 (three) times daily before meals. 90 tablet 11  . sertraline (ZOLOFT) 100 MG tablet Take 200 mg by mouth daily.   12  . SUMAtriptan (IMITREX) 50 MG tablet TAKE 1 TABLET BY MOUTH AS NEEDED FOR MIGRAINE, CAN REPEAT IN 2 HOURS IF HEADACHE PERSISTS 10 tablet 5  . telmisartan-hydrochlorothiazide (MICARDIS HCT) 80-12.5 MG tablet TAKE 1 TABLET BY MOUTH EVERY DAY 90 tablet 1  . tizanidine (ZANAFLEX) 2 MG capsule TAKE ONE CAPSULE 3 TIMES A DAY 270 capsule 1  . traZODone (DESYREL) 50 MG tablet Reported on 08/07/2015  3  . JANUVIA 100 MG tablet TAKE 1 TABLET (100 MG TOTAL) BY MOUTH DAILY. 90 tablet 1  . Insulin Glargine (TOUJEO SOLOSTAR) 300 UNIT/ML SOPN Inject 10 Units into the skin daily. (Patient not taking: Reported on 05/31/2017) 1.5 mL 11  . INVOKANA 100 MG TABS tablet TAKE 1 TABLET (100 MG TOTAL) BY MOUTH DAILY BEFORE  BREAKFAST. (Patient not taking: Reported on 05/31/2017) 90 tablet 1  . betamethasone acetate-betamethasone sodium phosphate (CELESTONE) injection 3 mg     . betamethasone acetate-betamethasone sodium phosphate (CELESTONE) injection 3 mg      No facility-administered medications prior to visit.     ROS Review of Systems  Constitutional: Negative.  Negative for appetite change, diaphoresis, fatigue and unexpected weight change.  HENT: Negative.   Eyes: Negative.   Respiratory: Negative.  Negative for cough, chest tightness, shortness of breath and wheezing.   Cardiovascular: Negative for chest pain, palpitations and leg swelling.  Gastrointestinal: Negative for abdominal pain, blood in stool, constipation, diarrhea, nausea and vomiting.  Endocrine: Negative.   Genitourinary: Negative.  Negative for difficulty urinating and dysuria.  Musculoskeletal: Negative.   Skin: Negative.   Allergic/Immunologic: Negative.   Neurological: Negative.  Negative for dizziness, weakness and light-headedness.  Hematological: Negative for adenopathy. Does not bruise/bleed easily.  Psychiatric/Behavioral: Negative.     Objective:  BP 138/90 (BP Location: Left Arm, Patient Position: Sitting, Cuff Size: Large)   Pulse 87   Temp 98.3 F (36.8 C) (Oral)   Resp 16   Ht 5\' 5"  (1.651 m)   Wt 244 lb (110.7 kg)   SpO2 97%   BMI 40.60 kg/m   BP Readings from Last 3 Encounters:  05/31/17 138/90  05/09/17 134/82  02/09/17 (!) 148/80    Wt Readings from Last 3 Encounters:  05/31/17 244 lb (110.7 kg)  05/09/17 254 lb 1.3 oz (115.2 kg)  02/09/17 255 lb (115.7 kg)    Physical Exam  Constitutional: She is oriented to person, place, and time. No distress.  HENT:  Mouth/Throat: Oropharynx is clear and moist. No oropharyngeal exudate.  Eyes: Conjunctivae are normal. Right eye exhibits no discharge. Left eye exhibits no discharge. No scleral icterus.  Neck: Normal range of motion. Neck supple. No JVD  present. No thyromegaly present.  Cardiovascular: Normal rate, regular rhythm and intact distal pulses.  Exam reveals no gallop and no friction rub.   No murmur heard. Pulmonary/Chest: Effort normal and breath sounds normal. No respiratory distress. She has no wheezes. She has no rales. She exhibits no tenderness.  Abdominal: Soft. Bowel sounds are normal. She exhibits no distension and no mass. There is no tenderness. There is no rebound and no guarding.  Musculoskeletal: Normal range of motion. She exhibits no edema, tenderness or deformity.  Lymphadenopathy:    She has no cervical adenopathy.  Neurological: She is alert and oriented to person, place, and time.  Skin: Skin is warm and dry. No rash noted. She is not diaphoretic. No erythema. No pallor.  Vitals reviewed.   Lab Results  Component Value Date   WBC 8.5 01/19/2017   HGB 14.2 01/19/2017   HCT 41.3 01/19/2017   PLT 235.0 01/19/2017   GLUCOSE 182 (H) 05/31/2017   CHOL 186 05/31/2017   TRIG 230.0 (H) 05/31/2017   HDL 50.00 05/31/2017   LDLDIRECT 106.0 05/31/2017   LDLCALC 124 (H) 05/11/2016   ALT 11 01/19/2017   AST 11 01/19/2017   NA 137 05/31/2017   K 3.4 (L) 05/31/2017   CL 104 05/31/2017   CREATININE 1.11 05/31/2017   BUN 24 (H) 05/31/2017   CO2 22 05/31/2017   TSH 2.00 05/11/2016   INR 1.33 07/25/2014   HGBA1C 6.9 (H) 05/31/2017   MICROALBUR 3.3 (H) 05/31/2017    Dg Mandible 4 Views  Result Date: 07/07/2016 CLINICAL DATA:  Right lower jaw pain. EXAM: MANDIBLE - 4+ VIEW COMPARISON:  None. FINDINGS: There is no evidence of fracture or other focal bone lesions. Right ventriculostomy catheter directed towards midline. IMPRESSION: Negative. Electronically Signed   By: Kathreen Devoid   On: 07/07/2016 13:28    Assessment & Plan:   Jorryn was seen today for diabetes and hypertension.  Diagnoses and all orders for this visit:  Essential hypertension-her blood pressure is well controlled.  Electrolytes and renal  function are normal. -     Basic metabolic panel; Future -     Urinalysis, Routine w reflex microscopic; Future -     AMB Referral to [facility]  Type 2 diabetes mellitus with complication, without long-term current use of insulin (HCC)-her A1c is at 6.9%.  Her blood sugars are adequately well controlled. -     Basic metabolic panel; Future -     Microalbumin / creatinine urine ratio; Future -     Hemoglobin A1c; Future -     POCT glycosylated hemoglobin (Hb A1C) -     POCT Glucose (Device for Home Use) -     AMB Referral to [facility] -     canagliflozin (INVOKANA) 100 MG TABS tablet; Take 1 tablet (100 mg total) by mouth daily before breakfast.  Hyperlipidemia with target LDL less than 100-  Her  ASCVD risk score is elevated so I have asked her to take a statin for CV risk reduction. -     Lipid panel; Future  Obesity (BMI 30-39.9)- she agrees to work on her lifestyle modifications to lose weight.  Psychogenic vomiting with nausea  Routine general medical examination at a health care facility  Visit for screening mammogram -     Cancel: MM DIGITAL SCREENING BILATERAL; Future -     MM SCREENING BREAST TOMO BILATERAL; Future   I have discontinued Ms. Doepke's JANUVIA and Insulin Glargine. I have also changed her INVOKANA to canagliflozin. Additionally, I am having her maintain her traZODone, Cholecalciferol, cyanocobalamin, sertraline, Fexofenadine HCl (MUCINEX ALLERGY PO), glucose blood, Insulin Pen Needle, repaglinide, pantoprazole, bromocriptine, SUMAtriptan, telmisartan-hydrochlorothiazide, tizanidine, and BYSTOLIC. We will stop administering betamethasone acetate-betamethasone sodium phosphate and betamethasone acetate-betamethasone sodium phosphate.  Meds ordered this encounter  Medications  . canagliflozin (INVOKANA) 100 MG TABS tablet    Sig: Take 1 tablet (100 mg total) by mouth daily before breakfast.    Dispense:  90 tablet    Refill:  1      Follow-up: Return in about 6 months (around 11/29/2017).  [name], MD

## 2017-05-31 NOTE — Telephone Encounter (Signed)
Order [id number]

## 2017-05-31 NOTE — Patient Instructions (Signed)

## 2017-06-01 MED ORDER — CANAGLIFLOZIN 100 MG PO TABS: 100.0000 mg | ORAL_TABLET | Freq: Every day | ORAL | 1 refills | Status: DC

## 2017-06-01 MED ORDER — ATORVASTATIN CALCIUM 20 MG PO TABS: 20.0000 mg | ORAL_TABLET | Freq: Every day | ORAL | 1 refills | Status: DC

## 2017-06-01 NOTE — Progress Notes (Signed)
   HPI: Patient presents today for follow up evaluation of 5th MPJ capsulitis of the right foot. She states she is doing well and denies any pain to her feet. She reports that she fell a couple weeks ago and dislocated bilateral hip joints.    Past Medical History:  Diagnosis Date  . Acid reflux disease   . Acute kidney injury ([place])   . Anxiety   . Depression   . Diabetes mellitus without complication ([place])   . Dizziness   . Falls   . Hydrocephalus   . Hypertension   . Hypotension   . MVP (mitral valve prolapse)   . Sinus bradycardia   . Transaminitis      Physical Exam: General: The patient is alert and oriented x3 in no acute distress.  Dermatology: Skin is warm, dry and supple bilateral lower extremities. Negative for open lesions or macerations.  Vascular: Palpable pedal pulses bilaterally. No edema or erythema noted. Capillary refill within normal limits.  Neurological: Epicritic and protective threshold grossly intact bilaterally.   Musculoskeletal Exam: Range of motion within normal limits to all pedal and ankle joints bilateral. Muscle strength 5/5 in all groups bilateral.    Assessment: - 5th MPJ capsulitis right-resolved   Plan of Care:  - Patient evaluated. - Continue wearing good shoe gear. - Recommended OTC Motrin when necessary. - Return to clinic when necessary.   Edrick Kins, DPM [facility]  Dr. Edrick Kins, DPM    [address], [place] [address]                Office [phone]  Fax [phone]

## 2017-06-22 LAB — COLOGUARD: Cologuard: NEGATIVE

## 2017-06-24 ENCOUNTER — Encounter: Payer: Self-pay | Admitting: Internal Medicine

## 2017-06-24 NOTE — Telephone Encounter (Signed)
Negative results

## 2017-06-27 ENCOUNTER — Encounter: Payer: Self-pay | Admitting: Internal Medicine

## 2017-06-27 ENCOUNTER — Ambulatory Visit (INDEPENDENT_AMBULATORY_CARE_PROVIDER_SITE_OTHER)
Admission: RE | Admit: 2017-06-27 | Discharge: 2017-06-27 | Disposition: A | Discharge status: Home / Self Care | Payer: Medicare Other | Source: Ambulatory Visit | Attending: Internal Medicine | Admitting: Internal Medicine

## 2017-06-27 ENCOUNTER — Ambulatory Visit: Payer: Medicare Other | Admitting: Internal Medicine

## 2017-06-27 VITALS — BP 144/90 | HR 90 | Temp 98.6°F | Resp 16 | Ht 65.0 in | Wt 234.0 lb

## 2017-06-27 DIAGNOSIS — R2689 Other abnormalities of gait and mobility: Secondary | ICD-10-CM

## 2017-06-27 DIAGNOSIS — R059 Cough, unspecified: Secondary | ICD-10-CM

## 2017-06-27 DIAGNOSIS — G910 Communicating hydrocephalus: Secondary | ICD-10-CM

## 2017-06-27 DIAGNOSIS — J410 Simple chronic bronchitis: Secondary | ICD-10-CM

## 2017-06-27 DIAGNOSIS — R05 Cough: Secondary | ICD-10-CM | POA: Diagnosis not present

## 2017-06-27 DIAGNOSIS — R296 Repeated falls: Secondary | ICD-10-CM | POA: Diagnosis not present

## 2017-06-27 MED ORDER — ZOSTER VAC RECOMB ADJUVANTED 50 MCG/0.5ML IM SUSR: 0.5000 mL | Freq: Once | INTRAMUSCULAR | 1 refills | Status: AC

## 2017-06-27 NOTE — Patient Instructions (Signed)
Cough, Adult Coughing is a reflex that clears your throat and your airways. Coughing helps to heal and protect your lungs. It is normal to cough occasionally, but a cough that happens with other symptoms or lasts a long time may be a sign of a condition that needs treatment. A cough may last only 2-3 weeks (acute), or it may last longer than 8 weeks (chronic). What are the causes? Coughing is commonly caused by:  Breathing in substances that irritate your lungs.  A viral or bacterial respiratory infection.  Allergies.  Asthma.  Postnasal drip.  Smoking.  Acid backing up from the stomach into the esophagus (gastroesophageal reflux).  Certain medicines.  Chronic lung problems, including COPD (or rarely, lung cancer).  Other medical conditions such as heart failure.  Follow these instructions at home: Pay attention to any changes in your symptoms. Take these actions to help with your discomfort:  Take medicines only as told by your health care provider. ? If you were prescribed an antibiotic medicine, take it as told by your health care provider. Do not stop taking the antibiotic even if you start to feel better. ? Talk with your health care provider before you take a cough suppressant medicine.  Drink enough fluid to keep your urine clear or pale yellow.  If the air is dry, use a cold steam vaporizer or humidifier in your bedroom or your home to help loosen secretions.  Avoid anything that causes you to cough at work or at home.  If your cough is worse at night, try sleeping in a semi-upright position.  Avoid cigarette smoke. If you smoke, quit smoking. If you need help quitting, ask your health care provider.  Avoid caffeine.  Avoid alcohol.  Rest as needed.  Contact a health care provider if:  You have new symptoms.  You cough up pus.  Your cough does not get better after 2-3 weeks, or your cough gets worse.  You cannot control your cough with suppressant  medicines and you are losing sleep.  You develop pain that is getting worse or pain that is not controlled with pain medicines.  You have a fever.  You have unexplained weight loss.  You have night sweats. Get help right away if:  You cough up blood.  You have difficulty breathing.  Your heartbeat is very fast. This information is not intended to replace advice given to you by your health care provider. Make sure you discuss any questions you have with your health care provider. Document Released: 01/15/2011 Document Revised: 12/25/2015 Document Reviewed: 09/25/2014 Elsevier Interactive Patient Education  2017 [employer]  

## 2017-06-27 NOTE — Progress Notes (Signed)
Subjective:  Patient ID: [name], female    DOB: 10-29-1945  Age: 71 y.o. MRN: [id number]  CC: Cough   HPI [name] presents for a 2-week history of cough that is productive of gray/green phlegm.  She denies fever or chills but has had a few night sweats.  Denies chest pain, hemoptysis, shortness of breath, or wheezing.  She sustained a fall about 4 weeks ago landing on both knees.  She has since been seeing an orthopedist and is getting Synvisc injected into her knees.  She had some swelling in her lower extremities after the fall but that has resolved.  She complains of poor balance and wants to see PT to help her prevent falls.  Outpatient Medications Prior to Visit  Medication Sig Dispense Refill  . atorvastatin (LIPITOR) 20 MG tablet Take 1 tablet (20 mg total) by mouth daily. 90 tablet 1  . bromocriptine (PARLODEL) 2.5 MG tablet 1/4 tab daily 8 tablet 11  . BYSTOLIC 10 MG tablet TAKE 1 TABLET BY MOUTH EVERY DAY 90 tablet 1  . canagliflozin (INVOKANA) 100 MG TABS tablet Take 1 tablet (100 mg total) by mouth daily before breakfast. 90 tablet 1  . Cholecalciferol 2000 units TABS Take 1 tablet (2,000 Units total) by mouth daily. 90 tablet 3  . cyanocobalamin 2000 MCG tablet Take 1 tablet (2,000 mcg total) by mouth daily. 90 tablet 3  . Fexofenadine HCl (MUCINEX ALLERGY PO) Take by mouth.    Marland Kitchen glucose blood test strip Use TID 100 each 11  . Insulin Pen Needle (NOVOFINE) 32G X 6 MM MISC 1 Act by Does not apply route daily. 100 each 3  . pantoprazole (PROTONIX) 40 MG tablet TAKE 1 TABLET EVERY DAY 90 tablet 3  . repaglinide (PRANDIN) 2 MG tablet Take 1 tablet (2 mg total) by mouth 3 (three) times daily before meals. 90 tablet 11  . sertraline (ZOLOFT) 100 MG tablet Take 200 mg by mouth daily.   12  . SUMAtriptan (IMITREX) 50 MG tablet TAKE 1 TABLET BY MOUTH AS NEEDED FOR MIGRAINE, CAN REPEAT IN 2 HOURS IF HEADACHE PERSISTS 10 tablet 5  . telmisartan-hydrochlorothiazide  (MICARDIS HCT) 80-12.5 MG tablet TAKE 1 TABLET BY MOUTH EVERY DAY 90 tablet 1  . tizanidine (ZANAFLEX) 2 MG capsule TAKE ONE CAPSULE 3 TIMES A DAY 270 capsule 1  . traZODone (DESYREL) 50 MG tablet Reported on 08/07/2015  3   No facility-administered medications prior to visit.     ROS Review of Systems  Constitutional: Negative.  Negative for appetite change, chills, diaphoresis, fatigue and fever.  HENT: Negative.  Negative for sinus pressure, sore throat and trouble swallowing.   Eyes: Negative.   Respiratory: Positive for cough. Negative for chest tightness, shortness of breath and wheezing.   Cardiovascular: Negative.  Negative for chest pain, palpitations and leg swelling.  Gastrointestinal: Negative.  Negative for abdominal pain, constipation, diarrhea, nausea and vomiting.  Endocrine: Negative.   Genitourinary: Negative.  Negative for difficulty urinating.  Musculoskeletal: Positive for arthralgias and gait problem. Negative for back pain, joint swelling, myalgias and neck pain.  Skin: Negative.   Allergic/Immunologic: Negative.   Neurological: Negative for dizziness, weakness, light-headedness, numbness and headaches.  Hematological: Negative for adenopathy. Does not bruise/bleed easily.  Psychiatric/Behavioral: Negative.     Objective:  BP (!) 144/90 (BP Location: Left Arm, Patient Position: Sitting, Cuff Size: Large)   Pulse 90   Temp 98.6 F (37 C) (Oral)   Resp 16  Ht 5\' 5"  (1.651 m)   Wt 234 lb (106.1 kg)   SpO2 98%   BMI 38.94 kg/m   BP Readings from Last 3 Encounters:  06/27/17 (!) 144/90  05/31/17 138/90  05/09/17 134/82    Wt Readings from Last 3 Encounters:  06/27/17 234 lb (106.1 kg)  05/31/17 244 lb (110.7 kg)  05/09/17 254 lb 1.3 oz (115.2 kg)    Physical Exam  Constitutional: She is oriented to person, place, and time. No distress.  HENT:  Mouth/Throat: Oropharynx is clear and moist. No oropharyngeal exudate.  Eyes: Conjunctivae are normal.  Left eye exhibits no discharge.  Neck: Normal range of motion. Neck supple.  Cardiovascular: Normal rate, regular rhythm and intact distal pulses. Exam reveals no gallop.  No murmur heard. Pulmonary/Chest: Effort normal and breath sounds normal. No respiratory distress. She has no wheezes. She has no rales. She exhibits no tenderness.  Abdominal: Soft. Bowel sounds are normal. She exhibits no distension and no mass. There is no tenderness. There is no guarding.  Musculoskeletal: Normal range of motion. She exhibits no edema or tenderness.  Neurological: She is alert and oriented to person, place, and time. She displays no atrophy and normal reflexes. No cranial nerve deficit or sensory deficit. She exhibits normal muscle tone. She displays no seizure activity. Coordination and gait abnormal.  She is ataxic and wobbles when she walks.  He appears very high risk for fall.  Skin: Skin is warm and dry. No rash noted. She is not diaphoretic. No erythema. No pallor.  Vitals reviewed.   Lab Results  Component Value Date   WBC 8.5 01/19/2017   HGB 14.2 01/19/2017   HCT 41.3 01/19/2017   PLT 235.0 01/19/2017   GLUCOSE 182 (H) 05/31/2017   CHOL 186 05/31/2017   TRIG 230.0 (H) 05/31/2017   HDL 50.00 05/31/2017   LDLDIRECT 106.0 05/31/2017   LDLCALC 124 (H) 05/11/2016   ALT 11 01/19/2017   AST 11 01/19/2017   NA 137 05/31/2017   K 3.4 (L) 05/31/2017   CL 104 05/31/2017   CREATININE 1.11 05/31/2017   BUN 24 (H) 05/31/2017   CO2 22 05/31/2017   TSH 2.00 05/11/2016   INR 1.33 07/25/2014   HGBA1C 6.9 (H) 05/31/2017   MICROALBUR 3.3 (H) 05/31/2017    Dg Mandible 4 Views  Result Date: 07/07/2016 CLINICAL DATA:  Right lower jaw pain. EXAM: MANDIBLE - 4+ VIEW COMPARISON:  None. FINDINGS: There is no evidence of fracture or other focal bone lesions. Right ventriculostomy catheter directed towards midline. IMPRESSION: Negative. Electronically Signed   By: Kathreen Devoid   On: 07/07/2016 13:28   Dg  Chest 2 View  Result Date: 06/27/2017 CLINICAL DATA:  Cough and sputum production for 3 weeks, history diabetes mellitus, hypertension EXAM: CHEST  2 VIEW COMPARISON:  12/24/2014 FINDINGS: Normal heart size, mediastinal contours, and pulmonary vascularity. Atherosclerotic calcification aorta. Chronic interstitial prominence throughout the periphery of both lungs similar to prior study. No definite acute infiltrate, pleural effusion or pneumothorax. Chronic central peribronchial thickening. Bones demineralized. IMPRESSION: Chronic bronchitic and interstitial disease changes. No acute abnormalities. Electronically Signed   By: Lavonia Dana M.D.   On: 06/27/2017 19:00     Assessment & Plan:   Joneisha was seen today for cough.  Diagnoses and all orders for this visit:  HYDROCEPHALUS, NORMAL PRESSURE -     Ambulatory referral to Physical Therapy  Frequent falls -     Ambulatory referral to Physical Therapy  Poor  balance -     Ambulatory referral to Physical Therapy  Cough-chest x-ray is remarkable for chronic bronchitis.  If her symptoms persist then I will recommend a LAMA inhaler. -     DG Chest 2 View; Future  Simple chronic bronchitis ([place])- as above  Other orders -     Zoster Vaccine Adjuvanted [facility]) injection; Inject 0.5 mLs into the muscle once for 1 dose.   I am having [name] start on Zoster Vaccine Adjuvanted. I am also having her maintain her traZODone, Cholecalciferol, cyanocobalamin, sertraline, Fexofenadine HCl (MUCINEX ALLERGY PO), glucose blood, Insulin Pen Needle, repaglinide, pantoprazole, bromocriptine, SUMAtriptan, telmisartan-hydrochlorothiazide, tizanidine, BYSTOLIC, canagliflozin, and atorvastatin.  Meds ordered this encounter  Medications  . Zoster Vaccine Adjuvanted [facility]) injection    Sig: Inject 0.5 mLs into the muscle once for 1 dose.    Dispense:  0.5 mL    Refill:  1     Follow-up: Return in about 3 weeks (around  07/18/2017).  [name], MD

## 2017-06-28 ENCOUNTER — Ambulatory Visit
Admission: RE | Admit: 2017-06-28 | Discharge: 2017-06-28 | Disposition: A | Discharge status: Home / Self Care | Payer: Medicare Other | Source: Ambulatory Visit | Attending: Internal Medicine | Admitting: Internal Medicine

## 2017-06-28 DIAGNOSIS — J410 Simple chronic bronchitis: Secondary | ICD-10-CM | POA: Insufficient documentation

## 2017-06-28 DIAGNOSIS — Z1231 Encounter for screening mammogram for malignant neoplasm of breast: Secondary | ICD-10-CM

## 2017-06-29 LAB — HM MAMMOGRAPHY

## 2017-07-01 LAB — HM DIABETES EYE EXAM

## 2017-07-04 ENCOUNTER — Telehealth: Payer: Self-pay | Admitting: Internal Medicine

## 2017-07-04 NOTE — Telephone Encounter (Signed)
Copied from [place]. Topic: General - Other >> Jul 04, 2017  1:36 PM [name] wrote: Reason for CRM: patient calling wanting Dr Ronnald Ramp assistant Adelina Mings to give her a call back she states that her cough has gotten worst and her flem in yellow

## 2017-07-05 ENCOUNTER — Other Ambulatory Visit: Payer: Self-pay | Admitting: Internal Medicine

## 2017-07-05 DIAGNOSIS — J988 Other specified respiratory disorders: Secondary | ICD-10-CM

## 2017-07-05 MED ORDER — CEFDINIR 300 MG PO CAPS: 300.0000 mg | ORAL_CAPSULE | Freq: Two times a day (BID) | ORAL | 0 refills | Status: DC

## 2017-07-05 NOTE — Telephone Encounter (Signed)
Called pt. Pt stated that she is feeling worse. Pt is coughing more and she has a lot of yellow phlegm coming up when she coughs.

## 2017-07-05 NOTE — Telephone Encounter (Signed)
RX sent

## 2017-07-05 NOTE — Telephone Encounter (Signed)
Pt informed rx has been sent.  

## 2017-07-07 ENCOUNTER — Encounter: Payer: Self-pay | Admitting: Internal Medicine

## 2017-07-07 ENCOUNTER — Ambulatory Visit: Payer: Medicare Other | Admitting: Internal Medicine

## 2017-07-07 VITALS — BP 134/76 | HR 80 | Temp 97.7°F | Resp 16 | Ht 65.0 in | Wt 231.0 lb

## 2017-07-07 DIAGNOSIS — J069 Acute upper respiratory infection, unspecified: Secondary | ICD-10-CM | POA: Diagnosis not present

## 2017-07-07 DIAGNOSIS — B9789 Other viral agents as the cause of diseases classified elsewhere: Secondary | ICD-10-CM

## 2017-07-07 MED ORDER — HYDROCOD POLST-CPM POLST ER 10-8 MG/5ML PO SUER: 5.0000 mL | Freq: Two times a day (BID) | ORAL | 0 refills | Status: DC | PRN

## 2017-07-07 NOTE — Patient Instructions (Signed)
Upper Respiratory Infection, Adult Most upper respiratory infections (URIs) are caused by a virus. A URI affects the nose, throat, and upper air passages. The most common type of URI is often called "the common cold." Follow these instructions at home:  Take medicines only as told by your doctor.  Gargle warm saltwater or take cough drops to comfort your throat as told by your doctor.  Use a warm mist humidifier or inhale steam from a shower to increase air moisture. This may make it easier to breathe.  Drink enough fluid to keep your pee (urine) clear or pale yellow.  Eat soups and other clear broths.  Have a healthy diet.  Rest as needed.  Go back to work when your fever is gone or your doctor says it is okay. ? You may need to stay home longer to avoid giving your URI to others. ? You can also wear a face mask and wash your hands often to prevent spread of the virus.  Use your inhaler more if you have asthma.  Do not use any tobacco products, including cigarettes, chewing tobacco, or electronic cigarettes. If you need help quitting, ask your doctor. Contact a doctor if:  You are getting worse, not better.  Your symptoms are not helped by medicine.  You have chills.  You are getting more short of breath.  You have brown or red mucus.  You have yellow or brown discharge from your nose.  You have pain in your face, especially when you bend forward.  You have a fever.  You have puffy (swollen) neck glands.  You have pain while swallowing.  You have white areas in the back of your throat. Get help right away if:  You have very bad or constant: ? Headache. ? Ear pain. ? Pain in your forehead, behind your eyes, and over your cheekbones (sinus pain). ? Chest pain.  You have long-lasting (chronic) lung disease and any of the following: ? Wheezing. ? Long-lasting cough. ? Coughing up blood. ? A change in your usual mucus.  You have a stiff neck.  You have  changes in your: ? Vision. ? Hearing. ? Thinking. ? Mood. This information is not intended to replace advice given to you by your health care provider. Make sure you discuss any questions you have with your health care provider. Document Released: 01/05/2008 Document Revised: 03/21/2016 Document Reviewed: 10/24/2013 Elsevier Interactive Patient Education  2018 [employer]  

## 2017-07-07 NOTE — Progress Notes (Signed)
Subjective:  Patient ID: [name], female    DOB: 1946/06/09  Age: 71 y.o. MRN: [id number]  CC: Sore Throat (Started yesterday-denies fevers/chills) and Cough   HPI YASMINE KILBOURNE presents for concerns about a 2-day history of sore throat and cough productive of clear phlegm.  She is taking a cephalosporin for a sinus infection.  She requests a prescription strength cough suppressant.  Outpatient Medications Prior to Visit  Medication Sig Dispense Refill  . atorvastatin (LIPITOR) 20 MG tablet Take 1 tablet (20 mg total) by mouth daily. 90 tablet 1  . bromocriptine (PARLODEL) 2.5 MG tablet 1/4 tab daily 8 tablet 11  . BYSTOLIC 10 MG tablet TAKE 1 TABLET BY MOUTH EVERY DAY 90 tablet 1  . canagliflozin (INVOKANA) 100 MG TABS tablet Take 1 tablet (100 mg total) by mouth daily before breakfast. 90 tablet 1  . Cholecalciferol 2000 units TABS Take 1 tablet (2,000 Units total) by mouth daily. 90 tablet 3  . cyanocobalamin 2000 MCG tablet Take 1 tablet (2,000 mcg total) by mouth daily. 90 tablet 3  . Fexofenadine HCl (MUCINEX ALLERGY PO) Take by mouth.    Marland Kitchen glucose blood test strip Use TID 100 each 11  . Insulin Pen Needle (NOVOFINE) 32G X 6 MM MISC 1 Act by Does not apply route daily. 100 each 3  . pantoprazole (PROTONIX) 40 MG tablet TAKE 1 TABLET EVERY DAY 90 tablet 3  . repaglinide (PRANDIN) 2 MG tablet Take 1 tablet (2 mg total) by mouth 3 (three) times daily before meals. 90 tablet 11  . sertraline (ZOLOFT) 100 MG tablet Take 200 mg by mouth daily.   12  . SUMAtriptan (IMITREX) 50 MG tablet TAKE 1 TABLET BY MOUTH AS NEEDED FOR MIGRAINE, CAN REPEAT IN 2 HOURS IF HEADACHE PERSISTS 10 tablet 5  . telmisartan-hydrochlorothiazide (MICARDIS HCT) 80-12.5 MG tablet TAKE 1 TABLET BY MOUTH EVERY DAY 90 tablet 1  . tizanidine (ZANAFLEX) 2 MG capsule TAKE ONE CAPSULE 3 TIMES A DAY 270 capsule 1  . traZODone (DESYREL) 50 MG tablet Reported on 08/07/2015  3  . cefdinir (OMNICEF) 300 MG  capsule Take 1 capsule (300 mg total) by mouth 2 (two) times daily for 10 days. 20 capsule 0   No facility-administered medications prior to visit.     ROS Review of Systems  Constitutional: Negative for chills, diaphoresis, fatigue and fever.  HENT: Negative.  Negative for facial swelling, sinus pressure, sore throat and trouble swallowing.   Respiratory: Positive for cough.   Cardiovascular: Negative.  Negative for chest pain, palpitations and leg swelling.  Gastrointestinal: Negative for abdominal pain, constipation, diarrhea, nausea and vomiting.  Endocrine: Negative.   Genitourinary: Negative for difficulty urinating.  Allergic/Immunologic: Negative.   Neurological: Negative.  Negative for dizziness.  Hematological: Negative for adenopathy. Does not bruise/bleed easily.  Psychiatric/Behavioral: Negative.     Objective:  BP 134/76 (BP Location: Left Arm, Patient Position: Sitting, Cuff Size: Normal)   Pulse 80   Temp 97.7 F (36.5 C) (Oral)   Resp 16   Ht 5\' 5"  (1.651 m)   Wt 231 lb (104.8 kg)   SpO2 98%   BMI 38.44 kg/m   BP Readings from Last 3 Encounters:  07/07/17 134/76  06/27/17 (!) 144/90  05/31/17 138/90    Wt Readings from Last 3 Encounters:  07/07/17 231 lb (104.8 kg)  06/27/17 234 lb (106.1 kg)  05/31/17 244 lb (110.7 kg)    Physical Exam  Constitutional: She is  oriented to person, place, and time. No distress.  HENT:  Mouth/Throat: Oropharynx is clear and moist and mucous membranes are normal. Mucous membranes are not pale, not dry and not cyanotic. No oral lesions. No trismus in the jaw. No uvula swelling. No oropharyngeal exudate, posterior oropharyngeal edema, posterior oropharyngeal erythema or tonsillar abscesses.  Eyes: Conjunctivae are normal. Left eye exhibits no discharge. No scleral icterus.  Neck: Normal range of motion. Neck supple. No JVD present. No thyromegaly present.  Cardiovascular: Normal rate, regular rhythm and intact distal  pulses.  No murmur heard. Pulmonary/Chest: Effort normal and breath sounds normal. No respiratory distress. She has no wheezes. She has no rales. She exhibits no tenderness.  Abdominal: Soft. Bowel sounds are normal. She exhibits no distension and no mass. There is no tenderness. There is no guarding.  Musculoskeletal: Normal range of motion. She exhibits no edema, tenderness or deformity.  Neurological: She is alert and oriented to person, place, and time.  Skin: Skin is warm and dry. No rash noted. She is not diaphoretic. No erythema. No pallor.  Vitals reviewed.   Lab Results  Component Value Date   WBC 8.5 01/19/2017   HGB 14.2 01/19/2017   HCT 41.3 01/19/2017   PLT 235.0 01/19/2017   GLUCOSE 182 (H) 05/31/2017   CHOL 186 05/31/2017   TRIG 230.0 (H) 05/31/2017   HDL 50.00 05/31/2017   LDLDIRECT 106.0 05/31/2017   LDLCALC 124 (H) 05/11/2016   ALT 11 01/19/2017   AST 11 01/19/2017   NA 137 05/31/2017   K 3.4 (L) 05/31/2017   CL 104 05/31/2017   CREATININE 1.11 05/31/2017   BUN 24 (H) 05/31/2017   CO2 22 05/31/2017   TSH 2.00 05/11/2016   INR 1.33 07/25/2014   HGBA1C 6.9 (H) 05/31/2017   MICROALBUR 3.3 (H) 05/31/2017    Mm Screening Breast Tomo Bilateral  Result Date: 06/29/2017 CLINICAL DATA:  Screening. EXAM: 2D DIGITAL SCREENING BILATERAL MAMMOGRAM WITH CAD AND ADJUNCT TOMO COMPARISON:  None. ACR Breast Density Category b: There are scattered areas of fibroglandular density. FINDINGS: There are no findings suspicious for malignancy. Images were processed with CAD. IMPRESSION: No mammographic evidence of malignancy. A result letter of this screening mammogram will be mailed directly to the patient. RECOMMENDATION: Screening mammogram in one year. (Code:SM-B-01Y) BI-RADS CATEGORY  1: Negative. Electronically Signed   By: Kristopher Oppenheim M.D.   On: 06/29/2017 10:43    Assessment & Plan:   Srija was seen today for sore throat and cough.  Diagnoses and all orders for this  visit:  Viral URI with cough- Her symptoms and exam are consistent with a viral cause.  No additional antibiotics are needed.  Will offer Tussionex suspension as needed for control of the cough. -     chlorpheniramine-HYDROcodone (TUSSIONEX PENNKINETIC ER) 10-8 MG/5ML SUER; Take 5 mLs by mouth every 12 (twelve) hours as needed for cough.   I have discontinued Karen Chafe Vandruff's cefdinir. I am also having her start on chlorpheniramine-HYDROcodone. Additionally, I am having her maintain her traZODone, Cholecalciferol, cyanocobalamin, sertraline, Fexofenadine HCl (MUCINEX ALLERGY PO), glucose blood, Insulin Pen Needle, repaglinide, pantoprazole, bromocriptine, SUMAtriptan, telmisartan-hydrochlorothiazide, tizanidine, BYSTOLIC, canagliflozin, and atorvastatin.  Meds ordered this encounter  Medications  . chlorpheniramine-HYDROcodone (TUSSIONEX PENNKINETIC ER) 10-8 MG/5ML SUER    Sig: Take 5 mLs by mouth every 12 (twelve) hours as needed for cough.    Dispense:  140 mL    Refill:  0     Follow-up: Return if symptoms worsen  or fail to improve.  [name], MD

## 2017-07-11 ENCOUNTER — Other Ambulatory Visit: Payer: Self-pay | Admitting: Internal Medicine

## 2017-07-11 DIAGNOSIS — G43001 Migraine without aura, not intractable, with status migrainosus: Secondary | ICD-10-CM

## 2017-07-14 ENCOUNTER — Encounter: Payer: Self-pay | Admitting: Internal Medicine

## 2017-07-14 ENCOUNTER — Ambulatory Visit: Payer: Medicare Other | Attending: Internal Medicine | Admitting: Physical Therapy

## 2017-07-18 ENCOUNTER — Ambulatory Visit: Payer: Self-pay | Admitting: *Deleted

## 2017-07-18 NOTE — Telephone Encounter (Signed)
   Reason for Disposition . Few streaks of blood mixed in with yellow or green sputum (all other triage questions negative)  Answer Assessment - Initial Assessment Questions 1. ONSET: "When did you start coughing up blood?"     Thursday 2. SEVERITY: "How many times?" "How much blood?" (e.g., flecks, streaks, tablespoons, etc)     Pt states :Pencil eraser size amount of blood, not alot 3. COUGHING SPASMS: "Did the blood appear after a coughing spell?"       Yes 4. RESPIRATORY DISTRESS: "Describe your breathing."      No complaints of trouble breathing 5. FEVER: "Do you have a fever?" If so, ask: "What is your temperature, how was it measured, and when did it start?"     No 6. SPUTUM: "Describe the color of your sputum" (clear, white, yellow, green), "Has there been any change recently?"    Thick, pale yellow sputum 7. CARDIAC HISTORY: "Do you have any history of heart disease?" (e.g., heart attack, congestive heart failure)      Not assesed 8. LUNG HISTORY: "Do you have any history of lung disease?"  (e.g., pulmonary embolus, asthma, emphysema)     Recent diagnosis of bronchitis 9. PE RISK FACTORS: "Do you have a history of blood clots?" (or: recent major surgery, recent prolonged travel, bedridden )     Not assessed 10. OTHER SYMPTOMS: "Do you have any other symptoms?" (e.g., nosebleed, chest pain, abdominal pain, vomiting) No  Protocols used: COUGHING UP BLOOD-A-AH  Pt wanted to make MD aware she was coughing up a small amount of blood with pale yellow sputum. Pt states the amount is not a lot, about the size of a pencil eraser. Pt states she has a coughing spell about once a day in which she coughs really hard that causes right side of ribs, shoulder and neck to hurt. Pt states she has completed the course of antibiotics and feels her cough is about the same.

## 2017-07-20 NOTE — Telephone Encounter (Signed)
Pt scheduled for an appt.

## 2017-07-21 ENCOUNTER — Ambulatory Visit: Payer: Medicare Other | Admitting: Internal Medicine

## 2017-07-21 ENCOUNTER — Encounter: Payer: Self-pay | Admitting: Internal Medicine

## 2017-07-21 VITALS — BP 134/82 | HR 76 | Temp 98.6°F | Ht 65.0 in | Wt 227.0 lb

## 2017-07-21 DIAGNOSIS — B9789 Other viral agents as the cause of diseases classified elsewhere: Secondary | ICD-10-CM

## 2017-07-21 DIAGNOSIS — J069 Acute upper respiratory infection, unspecified: Secondary | ICD-10-CM

## 2017-07-21 NOTE — Patient Instructions (Signed)
Upper Respiratory Infection, Adult Most upper respiratory infections (URIs) are caused by a virus. A URI affects the nose, throat, and upper air passages. The most common type of URI is often called "the common cold." Follow these instructions at home:  Take medicines only as told by your doctor.  Gargle warm saltwater or take cough drops to comfort your throat as told by your doctor.  Use a warm mist humidifier or inhale steam from a shower to increase air moisture. This may make it easier to breathe.  Drink enough fluid to keep your pee (urine) clear or pale yellow.  Eat soups and other clear broths.  Have a healthy diet.  Rest as needed.  Go back to work when your fever is gone or your doctor says it is okay. ? You may need to stay home longer to avoid giving your URI to others. ? You can also wear a face mask and wash your hands often to prevent spread of the virus.  Use your inhaler more if you have asthma.  Do not use any tobacco products, including cigarettes, chewing tobacco, or electronic cigarettes. If you need help quitting, ask your doctor. Contact a doctor if:  You are getting worse, not better.  Your symptoms are not helped by medicine.  You have chills.  You are getting more short of breath.  You have brown or red mucus.  You have yellow or brown discharge from your nose.  You have pain in your face, especially when you bend forward.  You have a fever.  You have puffy (swollen) neck glands.  You have pain while swallowing.  You have white areas in the back of your throat. Get help right away if:  You have very bad or constant: ? Headache. ? Ear pain. ? Pain in your forehead, behind your eyes, and over your cheekbones (sinus pain). ? Chest pain.  You have long-lasting (chronic) lung disease and any of the following: ? Wheezing. ? Long-lasting cough. ? Coughing up blood. ? A change in your usual mucus.  You have a stiff neck.  You have  changes in your: ? Vision. ? Hearing. ? Thinking. ? Mood. This information is not intended to replace advice given to you by your health care provider. Make sure you discuss any questions you have with your health care provider. Document Released: 01/05/2008 Document Revised: 03/21/2016 Document Reviewed: 10/24/2013 Elsevier Interactive Patient Education  2018 [employer]  

## 2017-07-23 NOTE — Progress Notes (Signed)
Subjective:  Patient ID: [name], female    DOB: [birth year]  Age: 71 y.o. MRN: [id number]  CC: URI   HPI [name] presents for f/up - she had called [place] a few times over the last few weeks complaining that there was blood in the sputum from her nose and her cough.  Fortunately, she tells me that the bloody phlegm has not recurred over the last 5 days.  Her cough is markedly diminished.  She denies chest pain, fever, chills, shortness of breath, or wheezing.  She has stopped using the cough suppressant.  Outpatient Medications Prior to Visit  Medication Sig Dispense Refill  . atorvastatin (LIPITOR) 20 MG tablet Take 1 tablet (20 mg total) by mouth daily. 90 tablet 1  . bromocriptine (PARLODEL) 2.5 MG tablet 1/4 tab daily 8 tablet 11  . BYSTOLIC 10 MG tablet TAKE 1 TABLET BY MOUTH EVERY DAY 90 tablet 1  . canagliflozin (INVOKANA) 100 MG TABS tablet Take 1 tablet (100 mg total) by mouth daily before breakfast. 90 tablet 1  . Cholecalciferol 2000 units TABS Take 1 tablet (2,000 Units total) by mouth daily. 90 tablet 3  . cyanocobalamin 2000 MCG tablet Take 1 tablet (2,000 mcg total) by mouth daily. 90 tablet 3  . Fexofenadine HCl (MUCINEX ALLERGY PO) Take by mouth.    Marland Kitchen glucose blood test strip Use TID 100 each 11  . Insulin Pen Needle (NOVOFINE) 32G X 6 MM MISC 1 Act by Does not apply route daily. 100 each 3  . pantoprazole (PROTONIX) 40 MG tablet TAKE 1 TABLET EVERY DAY 90 tablet 3  . repaglinide (PRANDIN) 2 MG tablet Take 1 tablet (2 mg total) by mouth 3 (three) times daily before meals. 90 tablet 11  . sertraline (ZOLOFT) 100 MG tablet Take 200 mg by mouth daily.   12  . SUMAtriptan (IMITREX) 50 MG tablet TAKE 1 TABLET BY MOUTH AS NEEDED FOR MIGRAINE, CAN REPEAT IN 2 HOURS IF HEADACHE PERSISTS 10 tablet 5  . telmisartan-hydrochlorothiazide (MICARDIS HCT) 80-12.5 MG tablet TAKE 1 TABLET BY MOUTH EVERY DAY 90 tablet 1  . tizanidine (ZANAFLEX) 2 MG capsule TAKE ONE CAPSULE  3 TIMES A DAY 270 capsule 1  . traZODone (DESYREL) 50 MG tablet Reported on 08/07/2015  3  . chlorpheniramine-HYDROcodone (TUSSIONEX PENNKINETIC ER) 10-8 MG/5ML SUER Take 5 mLs by mouth every 12 (twelve) hours as needed for cough. 140 mL 0   No facility-administered medications prior to visit.     ROS Review of Systems  Constitutional: Negative.  Negative for chills, diaphoresis, fatigue and fever.  HENT: Positive for congestion and rhinorrhea. Negative for facial swelling, sinus pressure, sinus pain, sore throat, trouble swallowing and voice change.   Eyes: Negative.   Respiratory: Positive for cough. Negative for chest tightness, shortness of breath and wheezing.   Cardiovascular: Negative for chest pain, palpitations and leg swelling.  Gastrointestinal: Negative for abdominal pain, constipation, diarrhea, nausea and vomiting.  Endocrine: Negative.   Genitourinary: Negative.  Negative for difficulty urinating.  Musculoskeletal: Negative.  Negative for back pain and myalgias.  Skin: Negative.   Allergic/Immunologic: Negative.   Neurological: Negative.  Negative for dizziness, weakness and headaches.  Hematological: Negative for adenopathy. Does not bruise/bleed easily.  Psychiatric/Behavioral: Negative.     Objective:  BP 134/82 (BP Location: Left Arm, Patient Position: Sitting, Cuff Size: Large)   Pulse 76   Temp 98.6 F (37 C) (Oral)   Ht 5\' 5"  (1.651 m)  Wt 227 lb (103 kg)   SpO2 98%   BMI 37.77 kg/m   BP Readings from Last 3 Encounters:  07/21/17 134/82  07/07/17 134/76  06/27/17 (!) 144/90    Wt Readings from Last 3 Encounters:  07/21/17 227 lb (103 kg)  07/07/17 231 lb (104.8 kg)  06/27/17 234 lb (106.1 kg)    Physical Exam  Constitutional: She is oriented to person, place, and time. No distress.  HENT:  Mouth/Throat: No oropharyngeal exudate.  Eyes: Conjunctivae are normal. Left eye exhibits no discharge. No scleral icterus.  Neck: Normal range of motion.  Neck supple. No JVD present. No thyromegaly present.  Cardiovascular: Normal rate, regular rhythm and normal heart sounds.  No murmur heard. Pulmonary/Chest: Effort normal and breath sounds normal. No respiratory distress. She has no wheezes. She has no rales.  Abdominal: Soft. Bowel sounds are normal. She exhibits no mass. There is no tenderness. There is no guarding.  Musculoskeletal: Normal range of motion. She exhibits no edema, tenderness or deformity.  Neurological: She is alert and oriented to person, place, and time.  Skin: Skin is warm and dry. No rash noted. She is not diaphoretic. No erythema. No pallor.  Vitals reviewed.   Lab Results  Component Value Date   WBC 8.5 01/19/2017   HGB 14.2 01/19/2017   HCT 41.3 01/19/2017   PLT 235.0 01/19/2017   GLUCOSE 182 (H) 05/31/2017   CHOL 186 05/31/2017   TRIG 230.0 (H) 05/31/2017   HDL 50.00 05/31/2017   LDLDIRECT 106.0 05/31/2017   LDLCALC 124 (H) 05/11/2016   ALT 11 01/19/2017   AST 11 01/19/2017   NA 137 05/31/2017   K 3.4 (L) 05/31/2017   CL 104 05/31/2017   CREATININE 1.11 05/31/2017   BUN 24 (H) 05/31/2017   CO2 22 05/31/2017   TSH 2.00 05/11/2016   INR 1.33 07/25/2014   HGBA1C 6.9 (H) 05/31/2017   MICROALBUR 3.3 (H) 05/31/2017    Mm Screening Breast Tomo Bilateral  Result Date: 06/29/2017 CLINICAL DATA:  Screening. EXAM: 2D DIGITAL SCREENING BILATERAL MAMMOGRAM WITH CAD AND ADJUNCT TOMO COMPARISON:  None. ACR Breast Density Category b: There are scattered areas of fibroglandular density. FINDINGS: There are no findings suspicious for malignancy. Images were processed with CAD. IMPRESSION: No mammographic evidence of malignancy. A result letter of this screening mammogram will be mailed directly to the patient. RECOMMENDATION: Screening mammogram in one year. (Code:SM-B-01Y) BI-RADS CATEGORY  1: Negative. Electronically Signed   By: Kristopher Oppenheim M.D.   On: 06/29/2017 10:43    Assessment & Plan:   [name] was seen  today for uri.  Diagnoses and all orders for this visit:  Viral URI with cough- her recent description of blood in the nasal mucus and hemoptysis has resolved.  She is improving.  Will discontinue the cough suppressant.  She will let me know if she develops any new symptoms.   I have discontinued Clemon Chambers chlorpheniramine-HYDROcodone. I am also having her maintain her traZODone, Cholecalciferol, cyanocobalamin, sertraline, Fexofenadine HCl (MUCINEX ALLERGY PO), glucose blood, Insulin Pen Needle, repaglinide, pantoprazole, bromocriptine, telmisartan-hydrochlorothiazide, tizanidine, BYSTOLIC, canagliflozin, atorvastatin, and SUMAtriptan.  No orders of the defined types were placed in this encounter.    Follow-up: Return if symptoms worsen or fail to improve.  Kiara Calico, MD

## 2017-08-01 ENCOUNTER — Ambulatory Visit (INDEPENDENT_AMBULATORY_CARE_PROVIDER_SITE_OTHER)
Admission: RE | Admit: 2017-08-01 | Discharge: 2017-08-01 | Disposition: A | Discharge status: Home / Self Care | Payer: Medicare Other | Source: Ambulatory Visit | Attending: Internal Medicine | Admitting: Internal Medicine

## 2017-08-01 ENCOUNTER — Ambulatory Visit: Payer: Medicare Other | Admitting: Internal Medicine

## 2017-08-01 ENCOUNTER — Encounter: Payer: Self-pay | Admitting: Internal Medicine

## 2017-08-01 VITALS — BP 168/90 | HR 101 | Temp 97.4°F | Resp 16 | Ht 65.0 in | Wt 227.0 lb

## 2017-08-01 DIAGNOSIS — R059 Cough, unspecified: Secondary | ICD-10-CM

## 2017-08-01 DIAGNOSIS — R05 Cough: Secondary | ICD-10-CM

## 2017-08-01 DIAGNOSIS — I5189 Other ill-defined heart diseases: Secondary | ICD-10-CM

## 2017-08-01 DIAGNOSIS — I10 Essential (primary) hypertension: Secondary | ICD-10-CM | POA: Diagnosis not present

## 2017-08-01 DIAGNOSIS — I519 Heart disease, unspecified: Secondary | ICD-10-CM | POA: Diagnosis not present

## 2017-08-01 DIAGNOSIS — J4521 Mild intermittent asthma with (acute) exacerbation: Secondary | ICD-10-CM

## 2017-08-01 DIAGNOSIS — J45901 Unspecified asthma with (acute) exacerbation: Secondary | ICD-10-CM | POA: Insufficient documentation

## 2017-08-01 LAB — POCT EXHALED NITRIC OXIDE: FeNO level (ppb): 14

## 2017-08-01 MED ORDER — PREDNISONE 10 MG PO TABS: 40.0000 mg | ORAL_TABLET | Freq: Every day | ORAL | 0 refills | Status: DC

## 2017-08-01 MED ORDER — PROMETHAZINE-DM 6.25-15 MG/5ML PO SYRP: 5.0000 mL | ORAL_SOLUTION | Freq: Four times a day (QID) | ORAL | 0 refills | Status: DC | PRN

## 2017-08-01 MED ORDER — TELMISARTAN-HCTZ 80-12.5 MG PO TABS: 1.0000 | ORAL_TABLET | Freq: Every day | ORAL | 1 refills | Status: DC

## 2017-08-01 NOTE — Progress Notes (Signed)
Subjective:  Patient ID: [name], female    DOB: Aug 19, 1945  Age: 71 y.o. MRN: [id number]  CC: Cough   HPI SULLY MANZI presents for several week history of persistent cough.  The cough is productive of pale yellow phlegm.  She noticed a dime sized speck of blood in the sputum about 5 days ago but has not noticed any hemoptysis since then.  She complains of laryngitis and says she is having trouble controlling the phlegm.  She is trying to control the cough with over-the-counter remedies but is not getting much relief from her symptoms.  She denies shortness of breath, wheezing, night sweats, fever, chills, or chest pain.  She has been treated with a course of cephalosporin antibiotics and a narcotic cough suppressant.  She did not like the way the narcotic cough suppressant made her feel so she has not been taking it.  Her blood pressure is not well controlled.  She recently ran out of the ARB and thiazide diuretic and needs to have it refilled.  Outpatient Medications Prior to Visit  Medication Sig Dispense Refill  . atorvastatin (LIPITOR) 20 MG tablet Take 1 tablet (20 mg total) by mouth daily. 90 tablet 1  . bromocriptine (PARLODEL) 2.5 MG tablet 1/4 tab daily 8 tablet 11  . BYSTOLIC 10 MG tablet TAKE 1 TABLET BY MOUTH EVERY DAY 90 tablet 1  . canagliflozin (INVOKANA) 100 MG TABS tablet Take 1 tablet (100 mg total) by mouth daily before breakfast. 90 tablet 1  . Cholecalciferol 2000 units TABS Take 1 tablet (2,000 Units total) by mouth daily. 90 tablet 3  . cyanocobalamin 2000 MCG tablet Take 1 tablet (2,000 mcg total) by mouth daily. 90 tablet 3  . Fexofenadine HCl (MUCINEX ALLERGY PO) Take by mouth.    Marland Kitchen glucose blood test strip Use TID 100 each 11  . Insulin Pen Needle (NOVOFINE) 32G X 6 MM MISC 1 Act by Does not apply route daily. 100 each 3  . pantoprazole (PROTONIX) 40 MG tablet TAKE 1 TABLET EVERY DAY 90 tablet 3  . repaglinide (PRANDIN) 2 MG tablet Take 1 tablet  (2 mg total) by mouth 3 (three) times daily before meals. 90 tablet 11  . sertraline (ZOLOFT) 100 MG tablet Take 200 mg by mouth daily.   12  . SUMAtriptan (IMITREX) 50 MG tablet TAKE 1 TABLET BY MOUTH AS NEEDED FOR MIGRAINE, CAN REPEAT IN 2 HOURS IF HEADACHE PERSISTS 10 tablet 5  . tizanidine (ZANAFLEX) 2 MG capsule TAKE ONE CAPSULE 3 TIMES A DAY 270 capsule 1  . traZODone (DESYREL) 50 MG tablet Reported on 08/07/2015  3  . telmisartan-hydrochlorothiazide (MICARDIS HCT) 80-12.5 MG tablet TAKE 1 TABLET BY MOUTH EVERY DAY 90 tablet 1   No facility-administered medications prior to visit.     ROS Review of Systems  Constitutional: Negative for chills, diaphoresis, fatigue and fever.  HENT: Positive for voice change. Negative for sinus pressure, sore throat and trouble swallowing.   Eyes: Negative for visual disturbance.  Respiratory: Positive for cough. Negative for chest tightness, shortness of breath, wheezing and stridor.   Cardiovascular: Negative for chest pain, palpitations and leg swelling.  Gastrointestinal: Negative.  Negative for abdominal pain, constipation, diarrhea, nausea and vomiting.  Endocrine: Negative.   Genitourinary: Negative.  Negative for decreased urine volume, difficulty urinating, dysuria, flank pain, frequency, hematuria and urgency.  Musculoskeletal: Negative.  Negative for back pain and myalgias.  Skin: Negative.  Negative for color change and rash.  Neurological: Negative.  Negative for dizziness, weakness and headaches.  Hematological: Negative for adenopathy. Does not bruise/bleed easily.  Psychiatric/Behavioral: Negative.     Objective:  BP (!) 168/90 (BP Location: Left Arm, Patient Position: Sitting, Cuff Size: Large)   Pulse (!) 101   Temp (!) 97.4 F (36.3 C) (Oral)   Resp 16   Ht 5\' 5"  (1.651 m)   Wt 227 lb (103 kg)   SpO2 96%   BMI 37.77 kg/m   BP Readings from Last 3 Encounters:  08/01/17 (!) 168/90  07/21/17 134/82  07/07/17 134/76     Wt Readings from Last 3 Encounters:  08/01/17 227 lb (103 kg)  07/21/17 227 lb (103 kg)  07/07/17 231 lb (104.8 kg)    Physical Exam  Constitutional: She is oriented to person, place, and time. No distress.  HENT:  Mouth/Throat: Oropharynx is clear and moist. No oropharyngeal exudate.  Eyes: Conjunctivae are normal. Left eye exhibits no discharge. No scleral icterus.  Neck: Normal range of motion. Neck supple. No thyromegaly present.  Cardiovascular: Normal rate, regular rhythm and normal heart sounds. Exam reveals no gallop and no friction rub.  No murmur heard. Pulmonary/Chest: Effort normal and breath sounds normal. No respiratory distress. She has no wheezes. She has no rales.  Abdominal: Soft. Bowel sounds are normal. She exhibits no distension and no mass. There is no tenderness. There is no guarding.  Musculoskeletal: Normal range of motion. She exhibits no edema or tenderness.  Lymphadenopathy:    She has no cervical adenopathy.  Neurological: She is alert and oriented to person, place, and time.  Skin: Skin is warm and dry. No rash noted. She is not diaphoretic. No erythema. No pallor.  Vitals reviewed.   Lab Results  Component Value Date   WBC 8.5 01/19/2017   HGB 14.2 01/19/2017   HCT 41.3 01/19/2017   PLT 235.0 01/19/2017   GLUCOSE 182 (H) 05/31/2017   CHOL 186 05/31/2017   TRIG 230.0 (H) 05/31/2017   HDL 50.00 05/31/2017   LDLDIRECT 106.0 05/31/2017   LDLCALC 124 (H) 05/11/2016   ALT 11 01/19/2017   AST 11 01/19/2017   NA 137 05/31/2017   K 3.4 (L) 05/31/2017   CL 104 05/31/2017   CREATININE 1.11 05/31/2017   BUN 24 (H) 05/31/2017   CO2 22 05/31/2017   TSH 2.00 05/11/2016   INR 1.33 07/25/2014   HGBA1C 6.9 (H) 05/31/2017   MICROALBUR 3.3 (H) 05/31/2017    Mm Screening Breast Tomo Bilateral  Result Date: 06/29/2017 CLINICAL DATA:  Screening. EXAM: 2D DIGITAL SCREENING BILATERAL MAMMOGRAM WITH CAD AND ADJUNCT TOMO COMPARISON:  None. ACR Breast  Density Category b: There are scattered areas of fibroglandular density. FINDINGS: There are no findings suspicious for malignancy. Images were processed with CAD. IMPRESSION: No mammographic evidence of malignancy. A result letter of this screening mammogram will be mailed directly to the patient. RECOMMENDATION: Screening mammogram in one year. (Code:SM-B-01Y) BI-RADS CATEGORY  1: Negative. Electronically Signed   By: Kristopher Oppenheim M.D.   On: 06/29/2017 10:43   Dg Chest 2 View  Result Date: 08/01/2017 CLINICAL DATA:  71 year old female with persistent nonproductive cough. EXAM: CHEST  2 VIEW COMPARISON:  06/27/2017 and earlier. 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 Stable lung volumes at the upper limits of normal to hyperinflated. Mediastinal contours are stable and within normal limits. Visualized tracheal air column is within normal limits. Chronic bilateral increased pulmonary  interstitial markings remain stable. No pneumothorax, pulmonary edema, pleural effusion or acute pulmonary opacity. Negative visible bowel gas pattern. No acute osseous abnormality identified. IMPRESSION: Stable chest with chronic pulmonary interstitial changes and suspected hyperinflation. No acute findings identified. Electronically Signed   By: Genevie Ann M.D.   On: 08/01/2017 10:48     Assessment & Plan:   Jahira was seen today for cough.  Diagnoses and all orders for this visit:  Cough- Her chest x-ray is negative for infiltrate.  I do not think she would benefit from another round of antibiotics.  Will control the cough with Phenergan DM. -     DG Chest 2 View; Future -     promethazine-dextromethorphan (PROMETHAZINE-DM) 6.25-15 MG/5ML syrup; Take 5 mLs by mouth 4 (four) times daily as needed for cough. -     POCT EXHALED NITRIC OXIDE  Mild intermittent asthmatic bronchitis with exacerbation- She has a very slight increase in her FeNO score so will  try a course of systemic steroids to control her symptoms.  Will continue Phenergan DM as needed to control the cough. -     predniSONE (DELTASONE) 10 MG tablet; Take 4 tablets (40 mg total) by mouth daily with breakfast. -     promethazine-dextromethorphan (PROMETHAZINE-DM) 6.25-15 MG/5ML syrup; Take 5 mLs by mouth 4 (four) times daily as needed for cough.  Left ventricular diastolic dysfunction, NYHA class 2- She has a normal volume status today. -     telmisartan-hydrochlorothiazide (MICARDIS HCT) 80-12.5 MG tablet; Take 1 tablet by mouth daily.  Essential hypertension- Her blood pressure is not adequately well controlled.  Will restart the ARB and thiazide diuretic. -     telmisartan-hydrochlorothiazide (MICARDIS HCT) 80-12.5 MG tablet; Take 1 tablet by mouth daily.   I have changed Clemon Chambers telmisartan-hydrochlorothiazide. I am also having her start on predniSONE and promethazine-dextromethorphan. Additionally, I am having her maintain her traZODone, Cholecalciferol, cyanocobalamin, sertraline, Fexofenadine HCl (MUCINEX ALLERGY PO), glucose blood, Insulin Pen Needle, repaglinide, pantoprazole, bromocriptine, tizanidine, BYSTOLIC, canagliflozin, atorvastatin, and SUMAtriptan.  Meds ordered this encounter  Medications  . predniSONE (DELTASONE) 10 MG tablet    Sig: Take 4 tablets (40 mg total) by mouth daily with breakfast.    Dispense:  20 tablet    Refill:  0  . promethazine-dextromethorphan (PROMETHAZINE-DM) 6.25-15 MG/5ML syrup    Sig: Take 5 mLs by mouth 4 (four) times daily as needed for cough.    Dispense:  118 mL    Refill:  0  . telmisartan-hydrochlorothiazide (MICARDIS HCT) 80-12.5 MG tablet    Sig: Take 1 tablet by mouth daily.    Dispense:  90 tablet    Refill:  1     Follow-up: Return in about 3 weeks (around 08/22/2017).  [name], MD

## 2017-08-01 NOTE — Patient Instructions (Signed)
Cough, Adult  Coughing is a reflex that clears your throat and your airways. Coughing helps to heal and protect your lungs. It is normal to cough occasionally, but a cough that happens with other symptoms or lasts a long time may be a sign of a condition that needs treatment. A cough may last only 2-3 weeks (acute), or it may last longer than 8 weeks (chronic).  What are the causes?  Coughing is commonly caused by:   Breathing in substances that irritate your lungs.   A viral or bacterial respiratory infection.   Allergies.   Asthma.   Postnasal drip.   Smoking.   Acid backing up from the stomach into the esophagus (gastroesophageal reflux).   Certain medicines.   Chronic lung problems, including COPD (or rarely, lung cancer).   Other medical conditions such as heart failure.    Follow these instructions at home:  Pay attention to any changes in your symptoms. Take these actions to help with your discomfort:   Take medicines only as told by your health care provider.  ? If you were prescribed an antibiotic medicine, take it as told by your health care provider. Do not stop taking the antibiotic even if you start to feel better.  ? Talk with your health care provider before you take a cough suppressant medicine.   Drink enough fluid to keep your urine clear or pale yellow.   If the air is dry, use a cold steam vaporizer or humidifier in your bedroom or your home to help loosen secretions.   Avoid anything that causes you to cough at work or at home.   If your cough is worse at night, try sleeping in a semi-upright position.   Avoid cigarette smoke. If you smoke, quit smoking. If you need help quitting, ask your health care provider.   Avoid caffeine.   Avoid alcohol.   Rest as needed.    Contact a health care provider if:   You have new symptoms.   You cough up pus.   Your cough does not get better after 2-3 weeks, or your cough gets worse.   You cannot control your cough with suppressant  medicines and you are losing sleep.   You develop pain that is getting worse or pain that is not controlled with pain medicines.   You have a fever.   You have unexplained weight loss.   You have night sweats.  Get help right away if:   You cough up blood.   You have difficulty breathing.   Your heartbeat is very fast.  This information is not intended to replace advice given to you by your health care provider. Make sure you discuss any questions you have with your health care provider.  Document Released: 01/15/2011 Document Revised: 12/25/2015 Document Reviewed: 09/25/2014  [employer] Interactive Patient Education  2018 [employer]

## 2017-08-10 ENCOUNTER — Ambulatory Visit: Payer: Medicare Other | Admitting: Internal Medicine

## 2017-08-10 ENCOUNTER — Encounter: Payer: Self-pay | Admitting: Internal Medicine

## 2017-08-10 VITALS — BP 160/88 | HR 118 | Temp 98.4°F | Resp 16 | Ht 65.0 in | Wt 222.8 lb

## 2017-08-10 DIAGNOSIS — R05 Cough: Secondary | ICD-10-CM

## 2017-08-10 DIAGNOSIS — I493 Ventricular premature depolarization: Secondary | ICD-10-CM

## 2017-08-10 DIAGNOSIS — I10 Essential (primary) hypertension: Secondary | ICD-10-CM

## 2017-08-10 DIAGNOSIS — R059 Cough, unspecified: Secondary | ICD-10-CM

## 2017-08-10 DIAGNOSIS — J410 Simple chronic bronchitis: Secondary | ICD-10-CM

## 2017-08-10 MED ORDER — TIOTROPIUM BROMIDE-OLODATEROL 2.5-2.5 MCG/ACT IN AERS: 2.0000 | INHALATION_SPRAY | Freq: Every day | RESPIRATORY_TRACT | 5 refills | Status: DC

## 2017-08-10 NOTE — Progress Notes (Signed)
Subjective:  Patient ID: [name], female    DOB: [birth year]  Age: 72 y.o. MRN: [id number]  CC: Bronchitis; Hypertension; and Cough   HPI [name] presents for f/up - she complains of persistent but improving cough that is productive of clear phlegm.  She has had a few night sweats and chills but she denies fever.  She complains of DOE, shortness of breath, and wheezing.  She has also had a couple of episodes of nausea and vomiting.  She denies loss of appetite, abdominal pain, or diarrhea.  Outpatient Medications Prior to Visit  Medication Sig Dispense Refill  . atorvastatin (LIPITOR) 20 MG tablet Take 1 tablet (20 mg total) by mouth daily. 90 tablet 1  . bromocriptine (PARLODEL) 2.5 MG tablet 1/4 tab daily 8 tablet 11  . BYSTOLIC 10 MG tablet TAKE 1 TABLET BY MOUTH EVERY DAY 90 tablet 1  . canagliflozin (INVOKANA) 100 MG TABS tablet Take 1 tablet (100 mg total) by mouth daily before breakfast. 90 tablet 1  . Cholecalciferol 2000 units TABS Take 1 tablet (2,000 Units total) by mouth daily. 90 tablet 3  . cyanocobalamin 2000 MCG tablet Take 1 tablet (2,000 mcg total) by mouth daily. 90 tablet 3  . Fexofenadine HCl (MUCINEX ALLERGY PO) Take by mouth.    Marland Kitchen glucose blood test strip Use TID 100 each 11  . Insulin Pen Needle (NOVOFINE) 32G X 6 MM MISC 1 Act by Does not apply route daily. 100 each 3  . pantoprazole (PROTONIX) 40 MG tablet TAKE 1 TABLET EVERY DAY 90 tablet 3  . repaglinide (PRANDIN) 2 MG tablet Take 1 tablet (2 mg total) by mouth 3 (three) times daily before meals. 90 tablet 11  . sertraline (ZOLOFT) 100 MG tablet Take 200 mg by mouth daily.   12  . SUMAtriptan (IMITREX) 50 MG tablet TAKE 1 TABLET BY MOUTH AS NEEDED FOR MIGRAINE, CAN REPEAT IN 2 HOURS IF HEADACHE PERSISTS 10 tablet 5  . telmisartan-hydrochlorothiazide (MICARDIS HCT) 80-12.5 MG tablet Take 1 tablet by mouth daily. 90 tablet 1  . tizanidine (ZANAFLEX) 2 MG capsule TAKE ONE CAPSULE 3 TIMES A DAY  270 capsule 1  . traZODone (DESYREL) 50 MG tablet Reported on 08/07/2015  3  . predniSONE (DELTASONE) 10 MG tablet Take 4 tablets (40 mg total) by mouth daily with breakfast. 20 tablet 0  . promethazine-dextromethorphan (PROMETHAZINE-DM) 6.25-15 MG/5ML syrup Take 5 mLs by mouth 4 (four) times daily as needed for cough. 118 mL 0   No facility-administered medications prior to visit.     ROS Review of Systems  Constitutional: Positive for chills and fever. Negative for activity change, appetite change, diaphoresis and fatigue.  HENT: Negative.  Negative for trouble swallowing and voice change.   Eyes: Negative for visual disturbance.  Respiratory: Positive for cough, shortness of breath and wheezing. Negative for chest tightness and stridor.   Cardiovascular: Negative for chest pain, palpitations and leg swelling.  Gastrointestinal: Positive for nausea. Negative for abdominal pain, constipation and diarrhea.  Endocrine: Negative.   Genitourinary: Negative.  Negative for decreased urine volume, difficulty urinating, dysuria, hematuria and urgency.  Musculoskeletal: Negative.  Negative for back pain, myalgias and neck pain.  Skin: Negative.  Negative for color change and pallor.  Allergic/Immunologic: Negative.   Neurological: Negative.  Negative for dizziness, weakness, light-headedness and headaches.  Hematological: Negative for adenopathy. Does not bruise/bleed easily.  Psychiatric/Behavioral: The patient is nervous/anxious.     Objective:  BP (!) 160/88 (  BP Location: Left Arm, Patient Position: Sitting, Cuff Size: Large)   Pulse (!) 118   Temp 98.4 F (36.9 C) (Oral)   Resp 16   Ht 5\' 5"  (1.651 m)   Wt 222 lb 12 oz (101 kg)   SpO2 97%   BMI 37.07 kg/m   BP Readings from Last 3 Encounters:  08/15/17 140/80  08/10/17 (!) 160/88  08/01/17 (!) 168/90    Wt Readings from Last 3 Encounters:  08/15/17 223 lb (101.2 kg)  08/10/17 222 lb 12 oz (101 kg)  08/01/17 227 lb (103 kg)     Physical Exam  Constitutional: She is oriented to person, place, and time. No distress.  HENT:  Mouth/Throat: Oropharynx is clear and moist. No oropharyngeal exudate.  Eyes: Conjunctivae are normal. Left eye exhibits no discharge. No scleral icterus.  Neck: Normal range of motion. Neck supple. No JVD present. No thyromegaly present.  Cardiovascular: Normal rate and regular rhythm.  Occasional extrasystoles are present. Exam reveals no gallop.  No murmur heard. EKG--- Sinus arrhythmia  - frequent multiform ectopic ventricular beats  # VECs = 4, # types 2 Voltage criteria for LVH  (R(aVL) exceeds 1.26 mV).   -Old anteroseptal infarct.   -Nonspecific ST depression  -consider subendocardial injury/ischemia.   ABNORMAL - the PVC's are new when compared to the prior EKG   Pulmonary/Chest: Effort normal and breath sounds normal. No respiratory distress. She has no wheezes. She has no rales. She exhibits no tenderness.  Abdominal: Soft. Bowel sounds are normal. She exhibits no mass. There is no tenderness. There is no rebound.  Musculoskeletal: Normal range of motion. She exhibits no edema or tenderness.  Lymphadenopathy:    She has no cervical adenopathy.  Neurological: She is alert and oriented to person, place, and time.  Skin: Skin is warm and dry. No rash noted. She is not diaphoretic. No erythema. No pallor.  Vitals reviewed.   Lab Results  Component Value Date   WBC 8.5 01/19/2017   HGB 14.2 01/19/2017   HCT 41.3 01/19/2017   PLT 235.0 01/19/2017   GLUCOSE 182 (H) 05/31/2017   CHOL 186 05/31/2017   TRIG 230.0 (H) 05/31/2017   HDL 50.00 05/31/2017   LDLDIRECT 106.0 05/31/2017   LDLCALC 124 (H) 05/11/2016   ALT 11 01/19/2017   AST 11 01/19/2017   NA 137 05/31/2017   K 3.4 (L) 05/31/2017   CL 104 05/31/2017   CREATININE 1.11 05/31/2017   BUN 24 (H) 05/31/2017   CO2 22 05/31/2017   TSH 2.00 05/11/2016   INR 1.33 07/25/2014   HGBA1C 6.9 (H) 05/31/2017   MICROALBUR  3.3 (H) 05/31/2017    Dg Chest 2 View  Result Date: 08/01/2017 CLINICAL DATA:  72 year old female with persistent nonproductive cough. EXAM: CHEST  2 VIEW COMPARISON:  06/27/2017 and earlier. 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 Stable lung volumes at the upper limits of normal to hyperinflated. Mediastinal contours are stable and within normal limits. Visualized tracheal air column is within normal limits. Chronic bilateral increased pulmonary interstitial markings remain stable. No pneumothorax, pulmonary edema, pleural effusion or acute pulmonary opacity. Negative visible bowel gas pattern. No acute osseous abnormality identified. IMPRESSION: Stable chest with chronic pulmonary interstitial changes and suspected hyperinflation. No acute findings identified. Electronically Signed   By: Genevie Ann M.D.   On: 08/01/2017 10:48    Assessment & Plan:   Tessa was seen today for bronchitis,  hypertension and cough.  Diagnoses and all orders for this visit:  Cough- I will check her chest x-ray to see if there is a mass or infiltrate.  Will treat the infiltrate if seen.  I have also asked her to undergo PFTs to see if she has developed asthma, chronic bronchitis, COPD, vascular lung disease, or restrictive lung disease. -     Pulmonary Function Test; Future -     DG Chest 2 View; Future  Simple chronic bronchitis ([place])- Will empirically treat her with a LABA/LAMA combination.  I gave her sample of Stiolto Respimat and showed her how to use it.  She demonstrated proficiency with its use. -     Tiotropium Bromide-Olodaterol (STIOLTO RESPIMAT) 2.5-2.5 MCG/ACT AERS; Inhale 2 puffs into the lungs daily.  Essential hypertension- Her blood pressure is adequately well controlled. 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  She is relatively asymptomatic with respect to this.  Nonetheless, I think she  should have a cardiac workup including an echocardiogram so I have referred her to cardiology. -     Ambulatory referral to Cardiology   I have discontinued Karen Chafe. Ingalls's predniSONE and promethazine-dextromethorphan. I am also having her start on Tiotropium Bromide-Olodaterol. Additionally, I am having her maintain her traZODone, Cholecalciferol, cyanocobalamin, sertraline, Fexofenadine HCl (MUCINEX ALLERGY PO), glucose blood, Insulin Pen Needle, repaglinide, pantoprazole, bromocriptine, tizanidine, BYSTOLIC, canagliflozin, atorvastatin, SUMAtriptan, and telmisartan-hydrochlorothiazide.  Meds ordered this encounter  Medications  . Tiotropium Bromide-Olodaterol (STIOLTO RESPIMAT) 2.5-2.5 MCG/ACT AERS    Sig: Inhale 2 puffs into the lungs daily.    Dispense:  4 g    Refill:  5     Follow-up: Return in about 3 weeks (around 08/31/2017).  [name], MD

## 2017-08-10 NOTE — Patient Instructions (Signed)
Cough, Adult  Coughing is a reflex that clears your throat and your airways. Coughing helps to heal and protect your lungs. It is normal to cough occasionally, but a cough that happens with other symptoms or lasts a long time may be a sign of a condition that needs treatment. A cough may last only 2-3 weeks (acute), or it may last longer than 8 weeks (chronic).  What are the causes?  Coughing is commonly caused by:   Breathing in substances that irritate your lungs.   A viral or bacterial respiratory infection.   Allergies.   Asthma.   Postnasal drip.   Smoking.   Acid backing up from the stomach into the esophagus (gastroesophageal reflux).   Certain medicines.   Chronic lung problems, including COPD (or rarely, lung cancer).   Other medical conditions such as heart failure.    Follow these instructions at home:  Pay attention to any changes in your symptoms. Take these actions to help with your discomfort:   Take medicines only as told by your health care provider.  ? If you were prescribed an antibiotic medicine, take it as told by your health care provider. Do not stop taking the antibiotic even if you start to feel better.  ? Talk with your health care provider before you take a cough suppressant medicine.   Drink enough fluid to keep your urine clear or pale yellow.   If the air is dry, use a cold steam vaporizer or humidifier in your bedroom or your home to help loosen secretions.   Avoid anything that causes you to cough at work or at home.   If your cough is worse at night, try sleeping in a semi-upright position.   Avoid cigarette smoke. If you smoke, quit smoking. If you need help quitting, ask your health care provider.   Avoid caffeine.   Avoid alcohol.   Rest as needed.    Contact a health care provider if:   You have new symptoms.   You cough up pus.   Your cough does not get better after 2-3 weeks, or your cough gets worse.   You cannot control your cough with suppressant  medicines and you are losing sleep.   You develop pain that is getting worse or pain that is not controlled with pain medicines.   You have a fever.   You have unexplained weight loss.   You have night sweats.  Get help right away if:   You cough up blood.   You have difficulty breathing.   Your heartbeat is very fast.  This information is not intended to replace advice given to you by your health care provider. Make sure you discuss any questions you have with your health care provider.  Document Released: 01/15/2011 Document Revised: 12/25/2015 Document Reviewed: 09/25/2014  [employer] Interactive Patient Education  2018 [employer]

## 2017-08-11 ENCOUNTER — Ambulatory Visit: Payer: Self-pay

## 2017-08-11 NOTE — Telephone Encounter (Signed)
Pt was seen in office yesterday for cough. Pt calling to report cough phlegm went from clear to pale yellow. Pt wants to know if she needs to increase the inhaler she was prescribed to twice per day. The name of the inhaler is tiotropium bromide- olodaterol. She denies fever. She is having runny nose and is SOB after walking.  Pt states she is concerned about the need for a cardiologist referral. She states she is worried about what the dr saw on EKG. Pt given emotional support.   Reason for Disposition . Cough with cold symptoms (e.g., runny nose, postnasal drip, throat clearing)  Answer Assessment - Initial Assessment Questions 1. ONSET: "When did the cough begin?"      Since Dec 21st  2. SEVERITY: "How bad is the cough today?"      frequent but "not bad" 3. RESPIRATORY DISTRESS: "Describe your breathing."  SOB with walking 4. FEVER: "Do you have a fever?" If so, ask: "What is your temperature, how was it measured, and when did it start?"     no 5. SPUTUM: "Describe the color of your sputum" (clear, white, yellow, green)     Yellow today yesterday clear 6. HEMOPTYSIS: "Are you coughing up any blood?" If so ask: "How much?" (flecks, streaks, tablespoons, etc.)     no 7. CARDIAC HISTORY: "Do you have any history of heart disease?" (e.g., heart attack, congestive heart failure)      No  But is going to be referred to a cardiologist 8. LUNG HISTORY: "Do you have any history of lung disease?"  (e.g., pulmonary embolus, asthma, emphysema)     no 9. PE RISK FACTORS: "Do you have a history of blood clots?" (or: recent major surgery, recent prolonged travel, bedridden )     No  10. OTHER SYMPTOMS: "Do you have any other symptoms?" (e.g., runny nose, wheezing, chest pain)       Runny fever 11. PREGNANCY: "Is there any chance you are pregnant?" "When was your last menstrual period?"       n/a 12. TRAVEL: "Have you traveled out of the country in the last month?" (e.g., travel history,  exposures)       no  Protocols used: [place]

## 2017-08-15 ENCOUNTER — Ambulatory Visit: Payer: Medicare Other | Admitting: Internal Medicine

## 2017-08-15 ENCOUNTER — Encounter: Payer: Self-pay | Admitting: Internal Medicine

## 2017-08-15 ENCOUNTER — Ambulatory Visit (INDEPENDENT_AMBULATORY_CARE_PROVIDER_SITE_OTHER)
Admission: RE | Admit: 2017-08-15 | Discharge: 2017-08-15 | Disposition: A | Discharge status: Home / Self Care | Payer: Medicare Other | Source: Ambulatory Visit | Attending: Internal Medicine | Admitting: Internal Medicine

## 2017-08-15 ENCOUNTER — Other Ambulatory Visit (INDEPENDENT_AMBULATORY_CARE_PROVIDER_SITE_OTHER): Payer: Medicare Other

## 2017-08-15 ENCOUNTER — Ambulatory Visit: Payer: Self-pay | Admitting: Internal Medicine

## 2017-08-15 VITALS — BP 140/80 | HR 83 | Temp 98.4°F | Ht 65.0 in | Wt 223.0 lb

## 2017-08-15 DIAGNOSIS — I493 Ventricular premature depolarization: Secondary | ICD-10-CM | POA: Diagnosis not present

## 2017-08-15 DIAGNOSIS — D510 Vitamin B12 deficiency anemia due to intrinsic factor deficiency: Secondary | ICD-10-CM | POA: Diagnosis not present

## 2017-08-15 DIAGNOSIS — E876 Hypokalemia: Secondary | ICD-10-CM

## 2017-08-15 DIAGNOSIS — F323 Major depressive disorder, single episode, severe with psychotic features: Secondary | ICD-10-CM

## 2017-08-15 DIAGNOSIS — I10 Essential (primary) hypertension: Secondary | ICD-10-CM | POA: Diagnosis not present

## 2017-08-15 DIAGNOSIS — E118 Type 2 diabetes mellitus with unspecified complications: Secondary | ICD-10-CM

## 2017-08-15 DIAGNOSIS — F5089 Other specified eating disorder: Secondary | ICD-10-CM | POA: Diagnosis not present

## 2017-08-15 DIAGNOSIS — R05 Cough: Secondary | ICD-10-CM

## 2017-08-15 DIAGNOSIS — R059 Cough, unspecified: Secondary | ICD-10-CM

## 2017-08-15 LAB — BASIC METABOLIC PANEL
BUN: 10 mg/dL (ref 6–23)
CO2: 26 mEq/L (ref 19–32)
Calcium: 9.6 mg/dL (ref 8.4–10.5)
Chloride: 102 mEq/L (ref 96–112)
Creatinine, Ser: 0.84 mg/dL (ref 0.40–1.20)
GFR: 71.14 mL/min (ref >60.00)
Glucose, Bld: 136 mg/dL — ABNORMAL HIGH (ref 70–99)
Potassium: 2.8 mEq/L — CL (ref 3.5–5.1)
Sodium: 140 mEq/L (ref 135–145)

## 2017-08-15 LAB — CBC WITH DIFFERENTIAL/PLATELET
Basophils Absolute: 0 10*3/uL (ref 0.0–0.1)
Basophils Relative: 0.1 % (ref 0.0–3.0)
Eosinophils Absolute: 0.1 10*3/uL (ref 0.0–0.7)
Eosinophils Relative: 0.9 % (ref 0.0–5.0)
HCT: 44 % (ref 36.0–46.0)
Hemoglobin: 14.8 g/dL (ref 12.0–15.0)
Lymphocytes Relative: 7.2 % — ABNORMAL LOW (ref 12.0–46.0)
Lymphs Abs: 0.7 10*3/uL (ref 0.7–4.0)
MCHC: 33.8 g/dL (ref 30.0–36.0)
MCV: 93.7 fl (ref 78.0–100.0)
Monocytes Absolute: 0.5 10*3/uL (ref 0.1–1.0)
Monocytes Relative: 5.1 % (ref 3.0–12.0)
Neutro Abs: 8.4 10*3/uL — ABNORMAL HIGH (ref 1.4–7.7)
Neutrophils Relative %: 86.7 % — ABNORMAL HIGH (ref 43.0–77.0)
Platelets: 202 10*3/uL (ref 150.0–400.0)
RBC: 4.69 Mil/uL (ref 3.87–5.11)
RDW: 13.5 % (ref 11.5–15.5)
WBC: 9.7 10*3/uL (ref 4.0–10.5)

## 2017-08-15 LAB — MAGNESIUM: Magnesium: 2 mg/dL (ref 1.5–2.5)

## 2017-08-15 LAB — HEMOGLOBIN A1C: Hgb A1c MFr Bld: 7.1 % — ABNORMAL HIGH (ref 4.6–6.5)

## 2017-08-15 MED ORDER — ARIPIPRAZOLE 2 MG PO TABS: 2.0000 mg | ORAL_TABLET | Freq: Every day | ORAL | 3 refills | Status: DC

## 2017-08-15 MED ORDER — POTASSIUM CHLORIDE ER 10 MEQ PO TBCR: 10.0000 meq | EXTENDED_RELEASE_TABLET | Freq: Three times a day (TID) | ORAL | 1 refills | Status: DC

## 2017-08-15 NOTE — Patient Instructions (Addendum)
Major Depressive Disorder, Adult Major depressive disorder (MDD) is a mental health condition. It may also be called clinical depression or unipolar depression. MDD usually causes feelings of sadness, hopelessness, or helplessness. MDD can also cause physical symptoms. It can interfere with work, school, relationships, and other everyday activities. MDD may be mild, moderate, or severe. It may occur once (single episode major depressive disorder) or it may occur multiple times (recurrent major depressive disorder). What are the causes? The exact cause of this condition is not known. MDD is most likely caused by a combination of things, which may include:  Genetic factors. These are traits that are passed along from parent to child.  Individual factors. Your personality, your behavior, and the way you handle your thoughts and feelings may contribute to MDD. This includes personality traits and behaviors learned from others.  Physical factors, such as: ? Differences in the part of your brain that controls emotion. This part of your brain may be different than it is in people who do not have MDD. ? Long-term (chronic) medical or psychiatric illnesses.  Social factors. Traumatic experiences or major life changes may play a role in the development of MDD.  What increases the risk? This condition is more likely to develop in women. The following factors may also make you more likely to develop MDD:  A family history of depression.  Troubled family relationships.  Abnormally low levels of certain brain chemicals.  Traumatic events in childhood, especially abuse or the loss of a parent.  Being under a lot of stress, or long-term stress, especially from upsetting life experiences or losses.  A history of: ? Chronic physical illness. ? Other mental health disorders. ? Substance abuse.  Poor living conditions.  Experiencing social exclusion or discrimination on a regular basis.  What are  the signs or symptoms? The main symptoms of MDD typically include:  Constant depressed or irritable mood.  Loss of interest in things and activities.  MDD symptoms may also include:  Sleeping or eating too much or too little.  Unexplained weight change.  Fatigue or low energy.  Feelings of worthlessness or guilt.  Difficulty thinking clearly or making decisions.  Thoughts of suicide or of harming others.  Physical agitation or weakness.  Isolation.  Severe cases of MDD may also occur with other symptoms, such as:  Delusions or hallucinations, in which you imagine things that are not real (psychotic depression).  Low-level depression that lasts at least a year (chronic depression or persistent depressive disorder).  Extreme sadness and hopelessness (melancholic depression).  Trouble speaking and moving (catatonic depression).  How is this diagnosed? This condition may be diagnosed based on:  Your symptoms.  Your medical history, including your mental health history. This may involve tests to evaluate your mental health. You may be asked questions about your lifestyle, including any drug and alcohol use, and how long you have had symptoms of MDD.  A physical exam.  Blood tests to rule out other conditions.  You must have a depressed mood and at least four other MDD symptoms most of the day, nearly every day in the same 2-week timeframe before your health care provider can confirm a diagnosis of MDD. How is this treated? This condition is usually treated by mental health professionals, such as psychologists, psychiatrists, and clinical social workers. You may need more than one type of treatment. Treatment may include:  Psychotherapy. This is also called talk therapy or counseling. Types of psychotherapy include: ? Cognitive behavioral   therapy (CBT). This type of therapy teaches you to recognize unhealthy feelings, thoughts, and behaviors, and replace them with  positive thoughts and actions. ? Interpersonal therapy (IPT). This helps you to improve the way you relate to and communicate with others. ? Family therapy. This treatment includes members of your family.  Medicine to treat anxiety and depression, or to help you control certain emotions and behaviors.  Lifestyle changes, such as: ? Limiting alcohol and drug use. ? Exercising regularly. ? Getting plenty of sleep. ? Making healthy eating choices. ? Spending more time outdoors.  Treatments involving stimulation of the brain can be used in situations with extremely severe symptoms, or when medicine or other therapies do not work over time. These treatments include electroconvulsive therapy, transcranial magnetic stimulation, and vagal nerve stimulation. Follow these instructions at home: Activity  Return to your normal activities as told by your health care provider.  Exercise regularly and spend time outdoors as told by your health care provider. General instructions  Take over-the-counter and prescription medicines only as told by your health care provider.  Do not drink alcohol. If you drink alcohol, limit your alcohol intake to no more than 1 drink a day for nonpregnant women and 2 drinks a day for men. One drink equals 12 oz of beer, 5 oz of wine, or 1 oz of hard liquor. Alcohol can affect any antidepressant medicines you are taking. Talk to your health care provider about your alcohol use.  Eat a healthy diet and get plenty of sleep.  Find activities that you enjoy doing, and make time to do them.  Consider joining a support group. Your health care provider may be able to recommend a support group.  Keep all follow-up visits as told by your health care provider. This is important. Where to find more information: National Alliance on Mental Illness  www.nami.org  [place]. National Institute of Mental Health  www.nimh.nih.gov  National Suicide Prevention  Lifeline  1-800-273-TALK (8255). This is free, 24-hour help.  Contact a health care provider if:  Your symptoms get worse.  You develop new symptoms. Get help right away if:  You self-harm.  You have serious thoughts about hurting yourself or others.  You see, hear, taste, smell, or feel things that are not present (hallucinate). This information is not intended to replace advice given to you by your health care provider. Make sure you discuss any questions you have with your health care provider. Document Released: 11/13/2012 Document Revised: 03/25/2016 Document Reviewed: 01/28/2016 Elsevier Interactive Patient Education  2018 [employer]  

## 2017-08-15 NOTE — Progress Notes (Signed)
Subjective:  Patient ID: [name], female    DOB: [birth year]  Age: 72 y.o. MRN: [id number]  CC: Depression; Hypertension; and Diabetes   HPI [name] presents for f/up - she complains of worsening anxiety, insomnia, anhedonia, and panic.  She has a history of cutting herself and has had the urge recently to cut herself with a butcher knife but she has refrained from doing so.  Her psychiatrist has given her a prescription for lorazepam which has helped some but not much.    She also complains of persistent nonproductive cough with a fever as high as 101.1 and a few episodes of diarrhea.  Outpatient Medications Prior to Visit  Medication Sig Dispense Refill  . atorvastatin (LIPITOR) 20 MG tablet Take 1 tablet (20 mg total) by mouth daily. 90 tablet 1  . bromocriptine (PARLODEL) 2.5 MG tablet 1/4 tab daily 8 tablet 11  . BYSTOLIC 10 MG tablet TAKE 1 TABLET BY MOUTH EVERY DAY 90 tablet 1  . canagliflozin (INVOKANA) 100 MG TABS tablet Take 1 tablet (100 mg total) by mouth daily before breakfast. 90 tablet 1  . Cholecalciferol 2000 units TABS Take 1 tablet (2,000 Units total) by mouth daily. 90 tablet 3  . cyanocobalamin 2000 MCG tablet Take 1 tablet (2,000 mcg total) by mouth daily. 90 tablet 3  . Fexofenadine HCl (MUCINEX ALLERGY PO) Take by mouth.    Marland Kitchen glucose blood test strip Use TID 100 each 11  . Insulin Pen Needle (NOVOFINE) 32G X 6 MM MISC 1 Act by Does not apply route daily. 100 each 3  . pantoprazole (PROTONIX) 40 MG tablet TAKE 1 TABLET EVERY DAY 90 tablet 3  . repaglinide (PRANDIN) 2 MG tablet Take 1 tablet (2 mg total) by mouth 3 (three) times daily before meals. 90 tablet 11  . sertraline (ZOLOFT) 100 MG tablet Take 200 mg by mouth daily.   12  . SUMAtriptan (IMITREX) 50 MG tablet TAKE 1 TABLET BY MOUTH AS NEEDED FOR MIGRAINE, CAN REPEAT IN 2 HOURS IF HEADACHE PERSISTS 10 tablet 5  . Tiotropium Bromide-Olodaterol (STIOLTO RESPIMAT) 2.5-2.5 MCG/ACT AERS Inhale  2 puffs into the lungs daily. 4 g 5  . tizanidine (ZANAFLEX) 2 MG capsule TAKE ONE CAPSULE 3 TIMES A DAY 270 capsule 1  . traZODone (DESYREL) 50 MG tablet Reported on 08/07/2015  3  . telmisartan-hydrochlorothiazide (MICARDIS HCT) 80-12.5 MG tablet Take 1 tablet by mouth daily. 90 tablet 1   No facility-administered medications prior to visit.     ROS Review of Systems  Constitutional: Positive for fatigue. Negative for appetite change, chills, diaphoresis, fever and unexpected weight change.  HENT: Negative.  Negative for facial swelling, sinus pressure, sore throat and trouble swallowing.   Eyes: Negative.  Negative for visual disturbance.  Respiratory: Positive for cough. Negative for chest tightness, shortness of breath and wheezing.   Cardiovascular: Negative for chest pain, palpitations and leg swelling.  Gastrointestinal: Positive for diarrhea and nausea. Negative for abdominal pain, constipation and vomiting.  Endocrine: Negative.   Genitourinary: Negative.   Musculoskeletal: Negative.   Skin: Negative.  Negative for color change and rash.  Neurological: Negative.  Negative for dizziness and weakness.  Hematological: Negative for adenopathy. Does not bruise/bleed easily.  Psychiatric/Behavioral: Positive for dysphoric mood, self-injury and sleep disturbance. Negative for agitation, confusion, decreased concentration and suicidal ideas. The patient is nervous/anxious. The patient is not hyperactive.     Objective:  BP 140/80 (BP Location: Left Arm, Patient Position:  Sitting, Cuff Size: Large)   Pulse 83   Temp 98.4 F (36.9 C) (Oral)   Ht 5\' 5"  (1.651 m)   Wt 223 lb (101.2 kg)   SpO2 94%   BMI 37.11 kg/m   BP Readings from Last 3 Encounters:  08/16/17 (!) 150/90  08/15/17 140/80  08/10/17 (!) 160/88    Wt Readings from Last 3 Encounters:  08/16/17 222 lb 6.4 oz (100.9 kg)  08/15/17 223 lb (101.2 kg)  08/10/17 222 lb 12 oz (101 kg)    Physical Exam    Constitutional: She is oriented to person, place, and time. No distress.  HENT:  Mouth/Throat: Oropharynx is clear and moist. No oropharyngeal exudate.  Eyes: Conjunctivae are normal. Left eye exhibits no discharge. No scleral icterus.  Neck: Normal range of motion. Neck supple. No JVD present. No thyromegaly present.  Cardiovascular: Normal rate, regular rhythm and normal heart sounds.  No murmur heard. Sinus  Rhythm  - frequent PAC s  # PACs = 2. Voltage criteria for LVH  (R(I)+S(III) exceeds 2.50 mV)  -Voltage criteria w/o ST/T abnormality may be normal.   -Nonspecific ST depression  -Nondiagnostic.   ABNORMAL - PVC's have resolved otherwise no changes compared to the prior EKG   Pulmonary/Chest: Effort normal and breath sounds normal. No respiratory distress. She has no wheezes. She has no rales.  Abdominal: Soft. Bowel sounds are normal. She exhibits no distension and no mass. There is no tenderness. There is no rebound and no guarding.  Musculoskeletal: Normal range of motion. She exhibits no edema, tenderness or deformity.  Lymphadenopathy:    She has no cervical adenopathy.  Neurological: She is alert and oriented to person, place, and time.  Skin: Skin is warm and dry. No rash noted. She is not diaphoretic. No erythema. No pallor.  Psychiatric: Her behavior is normal. Judgment and thought content normal. Her mood appears anxious. Her affect is not angry. Her speech is not rapid and/or pressured, not delayed and not tangential. She is not agitated, not slowed and not withdrawn. Thought content is not paranoid and not delusional. Cognition and memory are normal. She exhibits a depressed mood. She expresses no homicidal and no suicidal ideation. She expresses no suicidal plans and no homicidal plans.  Crying and sad She is attentive.  Vitals reviewed.   Lab Results  Component Value Date   WBC 9.7 08/15/2017   HGB 14.8 08/15/2017   HCT 44.0 08/15/2017   PLT 202.0 08/15/2017    GLUCOSE 136 (H) 08/15/2017   CHOL 186 05/31/2017   TRIG 230.0 (H) 05/31/2017   HDL 50.00 05/31/2017   LDLDIRECT 106.0 05/31/2017   LDLCALC 124 (H) 05/11/2016   ALT 11 01/19/2017   AST 11 01/19/2017   NA 140 08/15/2017   K 2.8 (LL) 08/15/2017   CL 102 08/15/2017   CREATININE 0.84 08/15/2017   BUN 10 08/15/2017   CO2 26 08/15/2017   TSH 0.82 08/15/2017   INR 1.33 07/25/2014   HGBA1C 7.1 (H) 08/15/2017   MICROALBUR 3.3 (H) 05/31/2017    Dg Chest 2 View  Result Date: 08/01/2017 CLINICAL DATA:  72 year old female with persistent nonproductive cough. EXAM: CHEST  2 VIEW COMPARISON:  06/27/2017 and earlier. 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 Stable lung volumes at the upper limits of normal to hyperinflated. Mediastinal contours are stable and within normal limits. Visualized tracheal air column is within normal limits. Chronic bilateral increased pulmonary  interstitial markings remain stable. No pneumothorax, pulmonary edema, pleural effusion or acute pulmonary opacity. Negative visible bowel gas pattern. No acute osseous abnormality identified. IMPRESSION: Stable chest with chronic pulmonary interstitial changes and suspected hyperinflation. No acute findings identified. Electronically Signed   By: Genevie Ann M.D.   On: 08/01/2017 10:48    Assessment & Plan:   Nathalie was seen today for depression, hypertension and diabetes.  Diagnoses and all orders for this visit:  PVC (premature ventricular contraction)- EKG shows no more PVCs but now there are PACs.  She has an appointment tomorrow with cardiology regarding this. -     EKG 12-Lead  Psychogenic vomiting with nausea  Current severe episode of major depressive disorder with psychotic features, unspecified whether recurrent ([place])- Will continue Zoloft at the current dose of 200 mg a day.  We will add an antipsychotic to boost the treatment of depression and control  psychotic symptoms. -     ARIPiprazole (ABILIFY) 2 MG tablet; Take 1 tablet (2 mg total) by mouth daily. -     Thyroid Panel With TSH; Future  Essential hypertension- Her blood pressure is adequately well controlled. -     Basic metabolic panel; Future -     Thyroid Panel With TSH; Future  Vitamin B12 deficiency anemia due to intrinsic factor deficiency -     CBC with Differential/Platelet; Future  Type 2 diabetes mellitus with complication, without long-term current use of insulin ([place])- Her A1c is at 7.1%.  Her blood sugars are adequately well controlled. -     Basic metabolic panel; Future -     Hemoglobin A1c; Future  Frequent PVCs -     Magnesium; Future  Chronic hypokalemia- this may be contributing to the PVCs.  Will start potassium replacement therapy. -     Discontinue: potassium chloride (K-DUR) 10 MEQ tablet; Take 1 tablet (10 mEq total) by mouth 3 (three) times daily.   I am having [name] start on ARIPiprazole. I am also having her maintain her traZODone, Cholecalciferol, cyanocobalamin, sertraline, Fexofenadine HCl (MUCINEX ALLERGY PO), glucose blood, Insulin Pen Needle, repaglinide, pantoprazole, bromocriptine, tizanidine, BYSTOLIC, canagliflozin, atorvastatin, SUMAtriptan, and Tiotropium Bromide-Olodaterol.  Meds ordered this encounter  Medications  . ARIPiprazole (ABILIFY) 2 MG tablet    Sig: Take 1 tablet (2 mg total) by mouth daily.    Dispense:  30 tablet    Refill:  3  . DISCONTD: potassium chloride (K-DUR) 10 MEQ tablet    Sig: Take 1 tablet (10 mEq total) by mouth 3 (three) times daily.    Dispense:  270 tablet    Refill:  1     Follow-up: Return in about 2 weeks (around 08/29/2017).  [name], MD

## 2017-08-16 ENCOUNTER — Ambulatory Visit (INDEPENDENT_AMBULATORY_CARE_PROVIDER_SITE_OTHER): Payer: Medicare Other | Admitting: Physician Assistant

## 2017-08-16 ENCOUNTER — Telehealth: Payer: Self-pay | Admitting: *Deleted

## 2017-08-16 ENCOUNTER — Encounter (INDEPENDENT_AMBULATORY_CARE_PROVIDER_SITE_OTHER): Payer: Self-pay

## 2017-08-16 ENCOUNTER — Encounter: Payer: Self-pay | Admitting: Physician Assistant

## 2017-08-16 VITALS — BP 150/90 | HR 96 | Resp 16 | Ht 65.0 in | Wt 222.4 lb

## 2017-08-16 DIAGNOSIS — R0609 Other forms of dyspnea: Secondary | ICD-10-CM | POA: Diagnosis not present

## 2017-08-16 DIAGNOSIS — R0600 Dyspnea, unspecified: Secondary | ICD-10-CM

## 2017-08-16 DIAGNOSIS — I491 Atrial premature depolarization: Secondary | ICD-10-CM | POA: Diagnosis not present

## 2017-08-16 DIAGNOSIS — E876 Hypokalemia: Secondary | ICD-10-CM | POA: Diagnosis not present

## 2017-08-16 DIAGNOSIS — I493 Ventricular premature depolarization: Secondary | ICD-10-CM | POA: Diagnosis not present

## 2017-08-16 LAB — THYROID PANEL WITH TSH
Free Thyroxine Index: 3.6 (ref 1.4–3.8)
T3 Uptake: 34 % (ref 22–35)
T4, Total: 10.6 ug/dL (ref 5.1–11.9)
TSH: 0.82 mIU/L (ref 0.40–4.50)

## 2017-08-16 MED ORDER — POTASSIUM CHLORIDE CRYS ER 20 MEQ PO TBCR: 20.0000 meq | EXTENDED_RELEASE_TABLET | Freq: Every day | ORAL | 3 refills | Status: DC

## 2017-08-16 MED ORDER — SPIRONOLACTONE 25 MG PO TABS: 12.5000 mg | ORAL_TABLET | Freq: Every day | ORAL | 3 refills | Status: DC

## 2017-08-16 MED ORDER — TELMISARTAN 80 MG PO TABS: 80.0000 mg | ORAL_TABLET | Freq: Every day | ORAL | 3 refills | Status: DC

## 2017-08-16 NOTE — Progress Notes (Signed)
Cardiology Office Note    Date:  08/16/2017   ID:  [name], DOB 1946/03/19, MRN [id number]  PCP:  Janith Lima, MD  Cardiologist: New to Dr. Tamala Julian  Chief Complaint: PVCs  History of Present Illness:   [name] is a 72 y.o. female with hx of HTN, PVCs, depression with psychotic features, DM referred by Dr. Ronnald Ramp for PVCs.   Echo 04/2014 showed normal LVEF with grade 2 DD.   Recently evaluated by PCP for cough and chronic bronchitis. CXR 08/15/17 without acute disease. K of 2.8 yesterday. Takes Kdur 36meq TID.   Here today for follow up by her self. Severe psychotic features. She is very anxious today. She has severe anxiety and depression. Exacerbated by watching news. She is cutter but hasn't done lately. Limited ambulation. Uses cane. She chronic DOE without chest pain/pression. No alcohol drinking or tobacco abuse. She denies orthopnea, PND, syncope, LE edema or dizziness.   Father had stoke in 25s and committed suicide in 58s.    Past Medical History:  Diagnosis Date  . Acid reflux disease   . Acute kidney injury ([place])   . Anxiety   . Depression   . Diabetes mellitus without complication ([place])   . Dizziness   . Falls   . Hydrocephalus   . Hypertension   . Hypotension   . MVP (mitral valve prolapse)   . Sinus bradycardia   . Transaminitis     Past Surgical History:  Procedure Laterality Date  . BRAIN SURGERY    . BREAST BIOPSY Left   . CHOLECYSTECTOMY    . ERCP N/A 02/28/2014   Procedure: ENDOSCOPIC RETROGRADE CHOLANGIOPANCREATOGRAPHY (ERCP);  Surgeon: Gatha Mayer, MD;  Location: [facility] ENDOSCOPY;  Service: Endoscopy;  Laterality: N/A;  talked to tiffany from or /ebp  . ESOPHAGOGASTRODUODENOSCOPY N/A 02/28/2014   Procedure: ESOPHAGOGASTRODUODENOSCOPY (EGD);  Surgeon: Gatha Mayer, MD;  Location: [facility] ENDOSCOPY;  Service: Endoscopy;  Laterality: N/A;  . VENTRICULOPERITONEAL SHUNT     right occipital    Current Medications: Prior to  Admission medications   Medication Sig Start Date End Date Taking? Authorizing Provider  ARIPiprazole (ABILIFY) 2 MG tablet Take 1 tablet (2 mg total) by mouth daily. 08/15/17   Janith Lima, MD  atorvastatin (LIPITOR) 20 MG tablet Take 1 tablet (20 mg total) by mouth daily. 06/01/17   Janith Lima, MD  bromocriptine (PARLODEL) 2.5 MG tablet 1/4 tab daily 12/16/16   Renato Shin, MD  BYSTOLIC 10 MG tablet TAKE 1 TABLET BY MOUTH EVERY DAY 05/23/17   Janith Lima, MD  canagliflozin [facility]) 100 MG TABS tablet Take 1 tablet (100 mg total) by mouth daily before breakfast. 06/01/17   Janith Lima, MD  Cholecalciferol 2000 units TABS Take 1 tablet (2,000 Units total) by mouth daily. 08/09/15   Janith Lima, MD  cyanocobalamin 2000 MCG tablet Take 1 tablet (2,000 mcg total) by mouth daily. 08/09/15   Janith Lima, MD  Fexofenadine HCl [facility] ALLERGY PO) Take by mouth.    [provider]  glucose blood test strip Use TID 10/13/16   Janith Lima, MD  Insulin Pen Needle (NOVOFINE) 32G X 6 MM MISC 1 Act by Does not apply route daily. 10/13/16   Janith Lima, MD  pantoprazole (PROTONIX) 40 MG tablet TAKE 1 TABLET EVERY DAY 12/08/16   Janith Lima, MD  potassium chloride (K-DUR) 10 MEQ tablet Take 1 tablet (10 mEq total) by  mouth 3 (three) times daily. 08/15/17   Janith Lima, MD  repaglinide (PRANDIN) 2 MG tablet Take 1 tablet (2 mg total) by mouth 3 (three) times daily before meals. 11/15/16   Renato Shin, MD  sertraline (ZOLOFT) 100 MG tablet Take 200 mg by mouth daily.  01/13/16   [provider]  SUMAtriptan (IMITREX) 50 MG tablet TAKE 1 TABLET BY MOUTH AS NEEDED FOR MIGRAINE, CAN REPEAT IN 2 HOURS IF HEADACHE PERSISTS 07/12/17   Janith Lima, MD  telmisartan-hydrochlorothiazide (MICARDIS HCT) 80-12.5 MG tablet Take 1 tablet by mouth daily. 08/01/17   Janith Lima, MD  Tiotropium Bromide-Olodaterol (STIOLTO RESPIMAT) 2.5-2.5 MCG/ACT AERS Inhale 2 puffs into  the lungs daily. 08/10/17   Janith Lima, MD  tizanidine (ZANAFLEX) 2 MG capsule TAKE ONE CAPSULE 3 TIMES A DAY 04/19/17   Janith Lima, MD  traZODone (DESYREL) 50 MG tablet Reported on 08/07/2015 07/24/15   [provider]    Allergies:   Metformin and related; Ace inhibitors; Erythromycin; Penicillins; and Topamax [topiramate]   Social History   Socioeconomic History  . Marital status: Divorced    Spouse name: None  . Number of children: None  . Years of education: None  . Highest education level: None  Social Needs  . Financial resource strain: None  . Food insecurity - worry: None  . Food insecurity - inability: None  . Transportation needs - medical: None  . Transportation needs - non-medical: None  Occupational History  . None  Tobacco Use  . Smoking status: Never Smoker  . Smokeless tobacco: Never Used  Substance and Sexual Activity  . Alcohol use: No  . Drug use: No  . Sexual activity: Not Currently  Other Topics Concern  . None  Social History Narrative  . None     Family History:  The patient's family history includes Alcohol abuse in her father; Diabetes in her father; Heart disease in her mother; Hypertension in her mother; Stroke in her mother.   ROS:   Please see the history of present illness.    ROS All other systems reviewed and are negative.   PHYSICAL EXAM:   VS:  BP (!) 150/90   Pulse 96   Resp 16   Ht 5\' 5"  (1.651 m)   Wt 222 lb 6.4 oz (100.9 kg)   SpO2 97%   BMI 37.01 kg/m    GEN: Well nourished, well developed, in no acute distress  HEENT: normal  Neck: no JVD, carotid bruits, or masses Cardiac: irregular ; no murmurs, rubs, or gallops,no edema  Respiratory:  clear to auscultation bilaterally, normal work of breathing GI: soft, nontender, nondistended, + BS MS: no deformity or atrophy  Skin: warm and dry, no rash Neuro:  Alert and Oriented x 3, Strength and sensation are intact Psych: euthymic mood, full affect  Wt  Readings from Last 3 Encounters:  08/16/17 222 lb 6.4 oz (100.9 kg)  08/15/17 223 lb (101.2 kg)  08/10/17 222 lb 12 oz (101 kg)      Studies/Labs Reviewed:   EKG:  EKG not ordered today.   The ekg ordered 08/15/17 demonstrates SR with frequent PACs and LVH EKG 08/10/17 - SR with PVCs and PACs   Recent Labs: 01/19/2017: ALT 11 08/15/2017: BUN 10; Creatinine, Ser 0.84; Hemoglobin 14.8; Magnesium 2.0; Platelets 202.0; Potassium 2.8; Sodium 140; TSH 0.82   Lipid Panel    Component Value Date/Time   CHOL 186 05/31/2017 1155   TRIG  230.0 (H) 05/31/2017 1155   HDL 50.00 05/31/2017 1155   CHOLHDL 4 05/31/2017 1155   VLDL 46.0 (H) 05/31/2017 1155   LDLCALC 124 (H) 05/11/2016 1334   LDLDIRECT 106.0 05/31/2017 1155    Additional studies/ records that were reviewed today include:   Echocardiogram: 04/2014 Study Conclusions  - Left ventricle: The cavity size was normal. There was mild focal basal and mild concentric hypertrophy of the septum. Systolic function was vigorous. The estimated ejection fraction was in the range of 65% to 70%. Wall motion was normal; there were no regional wall motion abnormalities. Features are consistent with a pseudonormal left ventricular filling pattern, with concomitant abnormal relaxation and increased filling pressure (grade 2 diastolic dysfunction). - Mitral valve: Calcified annulus.    ASSESSMENT & PLAN:   1. PVC/PVCs -Essentially asymptomatic.  Likely related to hypokalemia.  Reviewed PCP lab work.  Medication changes as described below.  Correct electrolyte abnormality first and then 48-hour monitor.  Will get echocardiogram to assess LV function.  2.  Hypertension -Her blood pressure is running high.  It was elevated during prior PCP visit as well.  Given hypokalemia and elevated blood pressure r/o hyperaldosteronism by PCP.  - Meds changes as below.  3. DOE - likely due to deconditioning. Get Echo.   4. Hypokalemia - Meds  changes as below.   Reviewed with DOD Dr. Tamala Julian.  Plan to correct electrolyte abnormalities>>> 48-hour monitor and echocardiogram.  If reassuring follow-up as needed. Cost would be significant issue. We have called pharmacy for cost and current medications. She has picked up has pysch and potassium supplement for pharmacy yesterday.   Current medications as listed below. The patient has given money (staff donation) to take below medications. Check BMET next week.   Medication Adjustments/Labs and Tests Ordered: Current medicines are reviewed at length with the patient today.  Concerns regarding medicines are outlined above.  Medication changes, Labs and Tests ordered today are listed in the Patient Instructions below. Patient Instructions  Medication Instructions:  1. CHANGE POTASSIUM TO 20 MEQ TABLET WITH DIRECTIONS TO TAKE 1 TABLET DAILY; NEW RX HAS BEEN SENT IN  2. STOP MICARDIS/HCTZ  3. START MICARDIS 80 MG DAILY; NEW RX HAS BEEN SENT IN  4. START SPIRONOLACTONE 25 MG TABLET WITH DIRECTIONS TO TAKE 1/2 TABLET DAILY; NEW RX HAS BEEN SENT IN  Labwork: 1. TODAY MAGNESIUM LEVEL  2. IN 1 WEEK BMET TO BE DONE ON 08/24/17; YOU CAN COME IN ANYTIME BETWEEN THE HOURS OF 7:30 AM AND 4:30 PM, THIS IS NOT A FASTING LAB SO YOU MAY HAVE BREAKFAST OR LUNCH BEFORE LAB WORK IS DONE .   Testing/Procedures: 1. Your physician has requested that you have an echocardiogram. Echocardiography is a painless test that uses sound waves to create images of your heart. It provides your doctor with information about the size and shape of your heart and how well your heart's chambers and valves are working. This procedure takes approximately one hour. There are no restrictions for this procedure. THIS IS TO BE ON 08/22/17 @ 10:30 AM     Follow-Up: FOLLOW WILL BE BASED ON PENDING TEST RESULTS   Any Other Special Instructions Will Be Listed Below (If Applicable).     If you need a refill on your cardiac  medications before your next appointment, please call your pharmacy.     Jarrett Soho, [place]  08/16/2017 11:43 AM    [address] [place], [place]  [address] Phone: [phone]; Fax: [phone]

## 2017-08-16 NOTE — Telephone Encounter (Signed)
I called [facility] today to confirm that the pt did indeed pick up her new Rx's for Micardis and Spironolactone. Pt did not have money for her appt today or the new medications. Our office was able to help the pt by gathering the amount she would need today to pick up these 2 new medications. Pt was very grateful for our help from our office.

## 2017-08-16 NOTE — Patient Instructions (Addendum)
Medication Instructions:  1. CHANGE POTASSIUM TO 20 MEQ TABLET WITH DIRECTIONS TO TAKE 1 TABLET DAILY; NEW RX HAS BEEN SENT IN  2. STOP MICARDIS/HCTZ  3. START MICARDIS 80 MG DAILY; NEW RX HAS BEEN SENT IN  4. START SPIRONOLACTONE 25 MG TABLET WITH DIRECTIONS TO TAKE 1/2 TABLET DAILY; NEW RX HAS BEEN SENT IN  Labwork: 1. TODAY MAGNESIUM LEVEL  2. IN 1 WEEK BMET TO BE DONE ON 08/24/17; YOU CAN COME IN ANYTIME BETWEEN THE HOURS OF 7:30 AM AND 4:30 PM, THIS IS NOT A FASTING LAB SO YOU MAY HAVE BREAKFAST OR LUNCH BEFORE LAB WORK IS DONE .   Testing/Procedures: 1. Your physician has requested that you have an echocardiogram. Echocardiography is a painless test that uses sound waves to create images of your heart. It provides your doctor with information about the size and shape of your heart and how well your heart's chambers and valves are working. This procedure takes approximately one hour. There are no restrictions for this procedure. THIS IS TO BE ON 08/22/17 @ 10:30 AM     Follow-Up: FOLLOW WILL BE BASED ON PENDING TEST RESULTS   Any Other Special Instructions Will Be Listed Below (If Applicable).     If you need a refill on your cardiac medications before your next appointment, please call your pharmacy.

## 2017-08-17 LAB — MAGNESIUM: Magnesium: 2.3 mg/dL (ref 1.6–2.3)

## 2017-08-22 ENCOUNTER — Ambulatory Visit (HOSPITAL_COMMUNITY): Payer: Self-pay

## 2017-08-22 ENCOUNTER — Ambulatory Visit (INDEPENDENT_AMBULATORY_CARE_PROVIDER_SITE_OTHER): Payer: Medicare Other | Admitting: Internal Medicine

## 2017-08-22 ENCOUNTER — Other Ambulatory Visit: Payer: Self-pay

## 2017-08-22 ENCOUNTER — Encounter: Payer: Self-pay | Admitting: Internal Medicine

## 2017-08-22 VITALS — BP 140/100 | HR 100 | Temp 98.4°F | Ht 65.0 in | Wt 214.0 lb

## 2017-08-22 DIAGNOSIS — I519 Heart disease, unspecified: Secondary | ICD-10-CM

## 2017-08-22 DIAGNOSIS — E876 Hypokalemia: Secondary | ICD-10-CM

## 2017-08-22 DIAGNOSIS — I5189 Other ill-defined heart diseases: Secondary | ICD-10-CM

## 2017-08-22 DIAGNOSIS — T50905A Adverse effect of unspecified drugs, medicaments and biological substances, initial encounter: Secondary | ICD-10-CM

## 2017-08-22 DIAGNOSIS — I10 Essential (primary) hypertension: Secondary | ICD-10-CM

## 2017-08-22 DIAGNOSIS — R05 Cough: Secondary | ICD-10-CM | POA: Diagnosis not present

## 2017-08-22 DIAGNOSIS — G43001 Migraine without aura, not intractable, with status migrainosus: Secondary | ICD-10-CM | POA: Diagnosis not present

## 2017-08-22 DIAGNOSIS — R059 Cough, unspecified: Secondary | ICD-10-CM

## 2017-08-22 LAB — PULMONARY FUNCTION TEST
DL/VA % pred: 81 %
DL/VA: 4.01 ml/min/mmHg/L
DLCO cor % pred: 74 %
DLCO cor: 19.15 ml/min/mmHg
DLCO unc % pred: 80 %
DLCO unc: 20.71 ml/min/mmHg
FEF 25-75 Post: 2.89 L/sec
FEF 25-75 Pre: 3.24 L/sec
FEF2575-%Change-Post: -10 %
FEF2575-%Pred-Post: 149 %
FEF2575-%Pred-Pre: 167 %
FEV1-%Change-Post: -4 %
FEV1-%Pred-Post: 106 %
FEV1-%Pred-Pre: 111 %
FEV1-Post: 2.51 L
FEV1-Pre: 2.62 L
FEV1FVC-%Change-Post: 0 %
FEV1FVC-%Pred-Pre: 112 %
FEV6-%Change-Post: -4 %
FEV6-%Pred-Post: 98 %
FEV6-%Pred-Pre: 103 %
FEV6-Post: 2.92 L
FEV6-Pre: 3.06 L
FEV6FVC-%Pred-Post: 105 %
FEV6FVC-%Pred-Pre: 105 %
FVC-%Change-Post: -4 %
FVC-%Pred-Post: 94 %
FVC-%Pred-Pre: 98 %
FVC-Post: 2.92 L
FVC-Pre: 3.06 L
Post FEV1/FVC ratio: 86 %
Post FEV6/FVC ratio: 100 %
Pre FEV1/FVC ratio: 86 %
Pre FEV6/FVC Ratio: 100 %
RV % pred: 109 %
RV: 2.46 L
TLC % pred: 106 %
TLC: 5.53 L

## 2017-08-22 MED ORDER — NEBIVOLOL HCL 10 MG PO TABS: 10.0000 mg | ORAL_TABLET | Freq: Every day | ORAL | 0 refills | Status: DC

## 2017-08-22 NOTE — Patient Instructions (Signed)

## 2017-08-22 NOTE — Progress Notes (Signed)
PFT completed today 08/22/17

## 2017-08-22 NOTE — Progress Notes (Signed)
Subjective:  Patient ID: [name], female    DOB: 1946/05/27  Age: 72 y.o. MRN: [id number]  CC: Hypertension   HPI [name] presents for a BP check - she ran out of bystolic about 1 week ago and her BP is therefore not well controlled.  Outpatient Medications Prior to Visit  Medication Sig Dispense Refill  . ARIPiprazole (ABILIFY) 2 MG tablet Take 1 tablet (2 mg total) by mouth daily. 30 tablet 3  . atorvastatin (LIPITOR) 20 MG tablet Take 1 tablet (20 mg total) by mouth daily. 90 tablet 1  . bromocriptine (PARLODEL) 2.5 MG tablet 1/4 tab daily 8 tablet 11  . canagliflozin (INVOKANA) 100 MG TABS tablet Take 1 tablet (100 mg total) by mouth daily before breakfast. 90 tablet 1  . Cholecalciferol 2000 units TABS Take 1 tablet (2,000 Units total) by mouth daily. 90 tablet 3  . cyanocobalamin 2000 MCG tablet Take 1 tablet (2,000 mcg total) by mouth daily. 90 tablet 3  . Fexofenadine HCl (MUCINEX ALLERGY PO) Take by mouth.    Marland Kitchen glucose blood test strip Use TID 100 each 11  . Insulin Pen Needle (NOVOFINE) 32G X 6 MM MISC 1 Act by Does not apply route daily. 100 each 3  . LORazepam (ATIVAN) 1 MG tablet TAKE 1 TABLET BY MOUTH THREE TIMES A DAY. (11/3, 12/3, 1/2, 2/1, 3/2)  4  . pantoprazole (PROTONIX) 40 MG tablet TAKE 1 TABLET EVERY DAY 90 tablet 3  . potassium chloride SA (KLOR-CON M20) 20 MEQ tablet Take 1 tablet (20 mEq total) by mouth daily. 90 tablet 3  . repaglinide (PRANDIN) 2 MG tablet Take 1 tablet (2 mg total) by mouth 3 (three) times daily before meals. 90 tablet 11  . sertraline (ZOLOFT) 100 MG tablet Take 200 mg by mouth daily.   12  . spironolactone (ALDACTONE) 25 MG tablet Take 0.5 tablets (12.5 mg total) by mouth daily. 90 tablet 3  . SUMAtriptan (IMITREX) 50 MG tablet TAKE 1 TABLET BY MOUTH AS NEEDED FOR MIGRAINE, CAN REPEAT IN 2 HOURS IF HEADACHE PERSISTS 10 tablet 5  . telmisartan (MICARDIS) 80 MG tablet Take 1 tablet (80 mg total) by mouth daily. 90 tablet  3  . Tiotropium Bromide-Olodaterol (STIOLTO RESPIMAT) 2.5-2.5 MCG/ACT AERS Inhale 2 puffs into the lungs daily. 4 g 5  . tizanidine (ZANAFLEX) 2 MG capsule TAKE ONE CAPSULE 3 TIMES A DAY 270 capsule 1  . traZODone (DESYREL) 50 MG tablet Reported on 08/07/2015  3  . BYSTOLIC 10 MG tablet TAKE 1 TABLET BY MOUTH EVERY DAY 90 tablet 1   No facility-administered medications prior to visit.     ROS Review of Systems  Constitutional: Negative for diaphoresis, fatigue and unexpected weight change.  HENT: Negative.   Eyes: Negative.  Negative for visual disturbance.  Respiratory: Negative for cough, chest tightness, shortness of breath and wheezing.   Cardiovascular: Negative for chest pain, palpitations and leg swelling.  Gastrointestinal: Negative for abdominal pain, diarrhea, nausea and vomiting.  Endocrine: Negative.   Genitourinary: Negative.  Negative for difficulty urinating.  Musculoskeletal: Negative.  Negative for back pain and neck pain.  Skin: Negative.   Neurological: Negative.  Negative for dizziness, weakness, light-headedness, numbness and headaches.  Hematological: Negative for adenopathy. Does not bruise/bleed easily.  Psychiatric/Behavioral: Negative for decreased concentration, dysphoric mood, sleep disturbance and suicidal ideas. The patient is nervous/anxious.     Objective:  BP (!) 140/100 (BP Location: Left Arm, Patient Position: Sitting, Cuff  Size: Large)   Pulse 100   Temp 98.4 F (36.9 C) (Oral)   Ht 5\' 5"  (1.651 m)   Wt 214 lb (97.1 kg)   SpO2 97%   BMI 35.61 kg/m   BP Readings from Last 3 Encounters:  08/22/17 (!) 140/100  08/16/17 (!) 150/90  08/15/17 140/80    Wt Readings from Last 3 Encounters:  08/22/17 214 lb (97.1 kg)  08/16/17 222 lb 6.4 oz (100.9 kg)  08/15/17 223 lb (101.2 kg)    Physical Exam  Constitutional: She is oriented to person, place, and time. No distress.  HENT:  Mouth/Throat: Oropharynx is clear and moist. No oropharyngeal  exudate.  Eyes: Conjunctivae are normal. Left eye exhibits no discharge. No scleral icterus.  Neck: Normal range of motion. Neck supple. No JVD present. No thyromegaly present.  Cardiovascular: Normal rate, regular rhythm and normal heart sounds.  No murmur heard. Pulmonary/Chest: Effort normal and breath sounds normal. No respiratory distress. She has no wheezes. She has no rales.  Abdominal: Soft. Bowel sounds are normal. She exhibits no distension and no mass. There is no tenderness.  Musculoskeletal: Normal range of motion. She exhibits no edema, tenderness or deformity.  Neurological: She is alert and oriented to person, place, and time.  Skin: Skin is warm and dry. No rash noted. She is not diaphoretic. No erythema. No pallor.  Psychiatric: She has a normal mood and affect. Her behavior is normal. Judgment and thought content normal.    Lab Results  Component Value Date   WBC 9.7 08/15/2017   HGB 14.8 08/15/2017   HCT 44.0 08/15/2017   PLT 202.0 08/15/2017   GLUCOSE 136 (H) 08/15/2017   CHOL 186 05/31/2017   TRIG 230.0 (H) 05/31/2017   HDL 50.00 05/31/2017   LDLDIRECT 106.0 05/31/2017   LDLCALC 124 (H) 05/11/2016   ALT 11 01/19/2017   AST 11 01/19/2017   NA 140 08/15/2017   K 2.8 (LL) 08/15/2017   CL 102 08/15/2017   CREATININE 0.84 08/15/2017   BUN 10 08/15/2017   CO2 26 08/15/2017   TSH 0.82 08/15/2017   INR 1.33 07/25/2014   HGBA1C 7.1 (H) 08/15/2017   MICROALBUR 3.3 (H) 05/31/2017    Dg Chest 2 View  Result Date: 08/15/2017 CLINICAL DATA:  Cough and congestion. EXAM: CHEST  2 VIEW COMPARISON:  08/01/2017; 12/24/2014 FINDINGS: Grossly unchanged cardiac silhouette and mediastinal contours with atherosclerotic plaque within the thoracic aorta. The lungs appear hyperexpanded with flattening of the diaphragms. Diffuse slightly nodular thickening of the pulmonary interstitium is unchanged. No new focal airspace opacities. No pleural effusion or pneumothorax. No evidence  of edema. No acute osseous abnormalities. Post cholecystectomy. Ventriculoperitoneal catheter tubing courses within the subcutaneous tissues of the midline of the chest. IMPRESSION: Similar findings of lung hyperexpansion and chronic bronchitic change without superimposed acute cardiopulmonary disease. Electronically Signed   By: Sandi Mariscal M.D.   On: 08/15/2017 13:23    Assessment & Plan:   Undine was seen today for hypertension.  Diagnoses and all orders for this visit:  Drug-induced hypokalemia- will recheck her K+ and Mg++ levels today -     Magnesium; Future  Essential hypertension- Her BP is not well controlled, will restart Bystolic (samples given) and will continue spironolactone to stabilize the K+ level -     nebivolol (BYSTOLIC) 10 MG tablet; Take 1 tablet (10 mg total) by mouth daily.  Left ventricular diastolic dysfunction, NYHA class 2- will restart Bystolic -  nebivolol (BYSTOLIC) 10 MG tablet; Take 1 tablet (10 mg total) by mouth daily.  Migraine without aura and with status migrainosus, not intractable -     nebivolol (BYSTOLIC) 10 MG tablet; Take 1 tablet (10 mg total) by mouth daily.   I have changed [name]'s BYSTOLIC to nebivolol. I am also having her maintain her traZODone, Cholecalciferol, cyanocobalamin, sertraline, Fexofenadine HCl (MUCINEX ALLERGY PO), glucose blood, Insulin Pen Needle, repaglinide, pantoprazole, bromocriptine, tizanidine, canagliflozin, atorvastatin, SUMAtriptan, Tiotropium Bromide-Olodaterol, ARIPiprazole, LORazepam, potassium chloride SA, telmisartan, and spironolactone.  Meds ordered this encounter  Medications  . nebivolol (BYSTOLIC) 10 MG tablet    Sig: Take 1 tablet (10 mg total) by mouth daily.    Dispense:  126 tablet    Refill:  0     Follow-up: Return in about 3 months (around 11/20/2017).  [name], MD

## 2017-08-29 ENCOUNTER — Other Ambulatory Visit (INDEPENDENT_AMBULATORY_CARE_PROVIDER_SITE_OTHER): Payer: Medicare Other

## 2017-08-29 ENCOUNTER — Ambulatory Visit: Payer: Medicare Other | Admitting: Internal Medicine

## 2017-08-29 ENCOUNTER — Encounter: Payer: Self-pay | Admitting: Internal Medicine

## 2017-08-29 ENCOUNTER — Other Ambulatory Visit: Payer: Self-pay | Admitting: Internal Medicine

## 2017-08-29 VITALS — BP 130/80 | HR 87 | Temp 97.6°F | Resp 16 | Ht 65.0 in | Wt 212.1 lb

## 2017-08-29 DIAGNOSIS — I493 Ventricular premature depolarization: Secondary | ICD-10-CM | POA: Diagnosis not present

## 2017-08-29 DIAGNOSIS — I10 Essential (primary) hypertension: Secondary | ICD-10-CM

## 2017-08-29 DIAGNOSIS — E876 Hypokalemia: Secondary | ICD-10-CM

## 2017-08-29 DIAGNOSIS — T50905A Adverse effect of unspecified drugs, medicaments and biological substances, initial encounter: Secondary | ICD-10-CM | POA: Diagnosis not present

## 2017-08-29 LAB — MAGNESIUM: Magnesium: 2 mg/dL (ref 1.5–2.5)

## 2017-08-29 LAB — BASIC METABOLIC PANEL
BUN: 30 mg/dL — ABNORMAL HIGH (ref 6–23)
CO2: 26 mEq/L (ref 19–32)
Calcium: 9.8 mg/dL (ref 8.4–10.5)
Chloride: 99 mEq/L (ref 96–112)
Creatinine, Ser: 1.19 mg/dL (ref 0.40–1.20)
GFR: 47.59 mL/min — ABNORMAL LOW (ref >60.00)
Glucose, Bld: 188 mg/dL — ABNORMAL HIGH (ref 70–99)
Potassium: 3.2 mEq/L — ABNORMAL LOW (ref 3.5–5.1)
Sodium: 138 mEq/L (ref 135–145)

## 2017-08-29 NOTE — Assessment & Plan Note (Signed)
She is asymptomatic with respect to this. On exam today her heart rate is normal with no extra beats. This was likely related to the hypokalemia. She agrees to pursue the valuation that was recommended by cardiology.

## 2017-08-29 NOTE — Patient Instructions (Signed)

## 2017-08-29 NOTE — Progress Notes (Signed)
Subjective:  Patient ID: [name], female    DOB: May 30, 1946  Age: 72 y.o. MRN: [id number]  CC: Hypertension   HPI [name] presents for f/up - She tells me she is compliant with the spironolactone and potassium supplement.  She has felt well recently with no cough, shortness of breath, palpitations, dizziness, or lightheadedness.  Outpatient Medications Prior to Visit  Medication Sig Dispense Refill  . ARIPiprazole (ABILIFY) 2 MG tablet Take 1 tablet (2 mg total) by mouth daily. 30 tablet 3  . atorvastatin (LIPITOR) 20 MG tablet Take 1 tablet (20 mg total) by mouth daily. 90 tablet 1  . bromocriptine (PARLODEL) 2.5 MG tablet 1/4 tab daily 8 tablet 11  . canagliflozin (INVOKANA) 100 MG TABS tablet Take 1 tablet (100 mg total) by mouth daily before breakfast. 90 tablet 1  . Cholecalciferol 2000 units TABS Take 1 tablet (2,000 Units total) by mouth daily. 90 tablet 3  . cyanocobalamin 2000 MCG tablet Take 1 tablet (2,000 mcg total) by mouth daily. 90 tablet 3  . Fexofenadine HCl (MUCINEX ALLERGY PO) Take by mouth.    Marland Kitchen glucose blood test strip Use TID 100 each 11  . Insulin Pen Needle (NOVOFINE) 32G X 6 MM MISC 1 Act by Does not apply route daily. 100 each 3  . LORazepam (ATIVAN) 1 MG tablet TAKE 1 TABLET BY MOUTH THREE TIMES A DAY. (11/3, 12/3, 1/2, 2/1, 3/2)  4  . nebivolol (BYSTOLIC) 10 MG tablet Take 1 tablet (10 mg total) by mouth daily. 126 tablet 0  . pantoprazole (PROTONIX) 40 MG tablet TAKE 1 TABLET EVERY DAY 90 tablet 3  . potassium chloride SA (KLOR-CON M20) 20 MEQ tablet Take 1 tablet (20 mEq total) by mouth daily. 90 tablet 3  . repaglinide (PRANDIN) 2 MG tablet Take 1 tablet (2 mg total) by mouth 3 (three) times daily before meals. 90 tablet 11  . sertraline (ZOLOFT) 100 MG tablet Take 200 mg by mouth daily.   12  . spironolactone (ALDACTONE) 25 MG tablet Take 0.5 tablets (12.5 mg total) by mouth daily. 90 tablet 3  . SUMAtriptan (IMITREX) 50 MG tablet  TAKE 1 TABLET BY MOUTH AS NEEDED FOR MIGRAINE, CAN REPEAT IN 2 HOURS IF HEADACHE PERSISTS 10 tablet 5  . telmisartan (MICARDIS) 80 MG tablet Take 1 tablet (80 mg total) by mouth daily. 90 tablet 3  . Tiotropium Bromide-Olodaterol (STIOLTO RESPIMAT) 2.5-2.5 MCG/ACT AERS Inhale 2 puffs into the lungs daily. 4 g 5  . tizanidine (ZANAFLEX) 2 MG capsule TAKE ONE CAPSULE 3 TIMES A DAY 270 capsule 1  . traZODone (DESYREL) 50 MG tablet Reported on 08/07/2015  3   No facility-administered medications prior to visit.     ROS Review of Systems  Constitutional: Negative for appetite change, diaphoresis and fatigue.  HENT: Negative.   Eyes: Negative for visual disturbance.  Respiratory: Negative for cough, chest tightness and wheezing.   Cardiovascular: Negative for chest pain, palpitations and leg swelling.  Gastrointestinal: Negative for abdominal pain, diarrhea and nausea.  Endocrine: Negative.   Genitourinary: Negative.  Negative for dysuria.  Musculoskeletal: Negative.  Negative for arthralgias and myalgias.  Skin: Negative.  Negative for rash.  Allergic/Immunologic: Negative.   Neurological: Negative.  Negative for dizziness, weakness, light-headedness and numbness.  Hematological: Negative for adenopathy. Does not bruise/bleed easily.  Psychiatric/Behavioral: Negative.     Objective:  BP 130/80 (BP Location: Left Arm, Patient Position: Sitting, Cuff Size: Large)   Pulse 87  Temp 97.6 F (36.4 C) (Oral)   Resp 16   Ht 5\' 5"  (1.651 m)   Wt 212 lb 1.9 oz (96.2 kg)   SpO2 96%   BMI 35.30 kg/m   BP Readings from Last 3 Encounters:  08/29/17 130/80  08/22/17 (!) 140/100  08/16/17 (!) 150/90    Wt Readings from Last 3 Encounters:  08/29/17 212 lb 1.9 oz (96.2 kg)  08/22/17 214 lb (97.1 kg)  08/16/17 222 lb 6.4 oz (100.9 kg)    Physical Exam  Constitutional: She is oriented to person, place, and time. No distress.  HENT:  Mouth/Throat: Oropharynx is clear and moist. No  oropharyngeal exudate.  Eyes: Conjunctivae are normal. Left eye exhibits no discharge. No scleral icterus.  Neck: Neck supple. No JVD present. No thyromegaly present.  Cardiovascular: Normal rate, regular rhythm and normal heart sounds. Exam reveals no gallop.  No murmur heard. Pulmonary/Chest: Effort normal and breath sounds normal. No respiratory distress. She has no wheezes. She has no rales.  Abdominal: Soft. Bowel sounds are normal. She exhibits no distension and no mass. There is no tenderness.  Musculoskeletal: Normal range of motion. She exhibits no edema, tenderness or deformity.  Neurological: She is alert and oriented to person, place, and time.  Skin: Skin is warm and dry. No rash noted. She is not diaphoretic. No erythema.  Vitals reviewed.   Lab Results  Component Value Date   WBC 9.7 08/15/2017   HGB 14.8 08/15/2017   HCT 44.0 08/15/2017   PLT 202.0 08/15/2017   GLUCOSE 188 (H) 08/29/2017   CHOL 186 05/31/2017   TRIG 230.0 (H) 05/31/2017   HDL 50.00 05/31/2017   LDLDIRECT 106.0 05/31/2017   LDLCALC 124 (H) 05/11/2016   ALT 11 01/19/2017   AST 11 01/19/2017   NA 138 08/29/2017   K 3.2 (L) 08/29/2017   CL 99 08/29/2017   CREATININE 1.19 08/29/2017   BUN 30 (H) 08/29/2017   CO2 26 08/29/2017   TSH 0.82 08/15/2017   INR 1.33 07/25/2014   HGBA1C 7.1 (H) 08/15/2017   MICROALBUR 3.3 (H) 05/31/2017    Dg Chest 2 View  Result Date: 08/15/2017 CLINICAL DATA:  Cough and congestion. EXAM: CHEST  2 VIEW COMPARISON:  08/01/2017; 12/24/2014 FINDINGS: Grossly unchanged cardiac silhouette and mediastinal contours with atherosclerotic plaque within the thoracic aorta. The lungs appear hyperexpanded with flattening of the diaphragms. Diffuse slightly nodular thickening of the pulmonary interstitium is unchanged. No new focal airspace opacities. No pleural effusion or pneumothorax. No evidence of edema. No acute osseous abnormalities. Post cholecystectomy. Ventriculoperitoneal  catheter tubing courses within the subcutaneous tissues of the midline of the chest. IMPRESSION: Similar findings of lung hyperexpansion and chronic bronchitic change without superimposed acute cardiopulmonary disease. Electronically Signed   By: Sandi Mariscal M.D.   On: 08/15/2017 13:23    Assessment & Plan:   Charliegh was seen today for hypertension.  Diagnoses and all orders for this visit:  Essential hypertension- Her blood pressure is well controlled.  Will continue Bystolic, spironolactone, and telmisartan.  Chronic hypokalemia- as below  Drug-induced hypokalemia-her potassium level is up to 3.2.  She agrees to continue practicing good compliance with the spironolactone and potassium supplement.   I am having [name] maintain her traZODone, Cholecalciferol, cyanocobalamin, sertraline, Fexofenadine HCl (MUCINEX ALLERGY PO), glucose blood, Insulin Pen Needle, repaglinide, pantoprazole, bromocriptine, tizanidine, canagliflozin, atorvastatin, SUMAtriptan, Tiotropium Bromide-Olodaterol, ARIPiprazole, LORazepam, potassium chloride SA, telmisartan, spironolactone, and nebivolol.  No orders of the defined types  were placed in this encounter.    Follow-up: Return in about 4 months (around 12/27/2017).  [name], MD

## 2017-08-30 ENCOUNTER — Telehealth: Payer: Self-pay | Admitting: Internal Medicine

## 2017-08-30 NOTE — Telephone Encounter (Unsigned)
Copied from [place] [id number]. Topic: Quick Communication - See Telephone Encounter >> Aug 30, 2017 12:46 PM Hewitt Shorts wrote: CRM for notification. See Telephone encounter for:   08/30/17.pt wanted to inform Dr. Ronnald Ramp that she had an episode of fast heart beat from about 8-11 las night but is better today best number [phone]

## 2017-08-31 NOTE — Telephone Encounter (Signed)
Contacted pt and she stated that she used 2 lidocaine patches instead of one (one on each knee) and she also drank a whole pepsi. Pt is asking if this could have caused the fast heart beat.

## 2017-08-31 NOTE — Telephone Encounter (Signed)
LVM for pt to call back as soon as possible.   RE: seeing how pt is doing.

## 2017-09-08 ENCOUNTER — Encounter: Payer: Self-pay | Admitting: Internal Medicine

## 2017-09-08 ENCOUNTER — Other Ambulatory Visit (INDEPENDENT_AMBULATORY_CARE_PROVIDER_SITE_OTHER): Payer: Medicare Other

## 2017-09-08 ENCOUNTER — Ambulatory Visit: Payer: Medicare Other | Admitting: Internal Medicine

## 2017-09-08 VITALS — BP 138/70 | HR 72 | Temp 97.8°F | Resp 16 | Ht 65.0 in | Wt 215.0 lb

## 2017-09-08 DIAGNOSIS — I10 Essential (primary) hypertension: Secondary | ICD-10-CM

## 2017-09-08 DIAGNOSIS — E876 Hypokalemia: Secondary | ICD-10-CM

## 2017-09-08 DIAGNOSIS — J410 Simple chronic bronchitis: Secondary | ICD-10-CM

## 2017-09-08 DIAGNOSIS — T50905A Adverse effect of unspecified drugs, medicaments and biological substances, initial encounter: Secondary | ICD-10-CM | POA: Diagnosis not present

## 2017-09-08 DIAGNOSIS — N644 Mastodynia: Secondary | ICD-10-CM

## 2017-09-08 LAB — BASIC METABOLIC PANEL
BUN: 17 mg/dL (ref 6–23)
CO2: 27 mEq/L (ref 19–32)
Calcium: 9.8 mg/dL (ref 8.4–10.5)
Chloride: 101 mEq/L (ref 96–112)
Creatinine, Ser: 0.9 mg/dL (ref 0.40–1.20)
GFR: 65.68 mL/min (ref >60.00)
Glucose, Bld: 173 mg/dL — ABNORMAL HIGH (ref 70–99)
Potassium: 3.4 mEq/L — ABNORMAL LOW (ref 3.5–5.1)
Sodium: 137 mEq/L (ref 135–145)

## 2017-09-08 MED ORDER — MUCINEX DM 30-600 MG PO TB12: 1.0000 | ORAL_TABLET | Freq: Two times a day (BID) | ORAL | 5 refills | Status: DC | PRN

## 2017-09-08 NOTE — Patient Instructions (Signed)

## 2017-09-08 NOTE — Progress Notes (Signed)
Subjective:  Patient ID: [name], female    DOB: [birth year]  Age: 72 y.o. MRN: [id number]  CC: Cough and Hypertension   HPI [name] presents for f/up - She complains of persistent cough that is productive of clear phlegm.  She complains that her shoulders and back hurt when she coughs.  She also complains of right breast pain and wants to have a diagnostic mammogram performed.  She complains of persistent anxiety but tells me she is getting symptom relief with sertraline and lorazepam.  She has diffuse bilateral lower extremity pain but she denies claudication.  Outpatient Medications Prior to Visit  Medication Sig Dispense Refill  . ARIPiprazole (ABILIFY) 2 MG tablet Take 1 tablet (2 mg total) by mouth daily. 30 tablet 3  . atorvastatin (LIPITOR) 20 MG tablet Take 1 tablet (20 mg total) by mouth daily. 90 tablet 1  . bromocriptine (PARLODEL) 2.5 MG tablet 1/4 tab daily 8 tablet 11  . canagliflozin (INVOKANA) 100 MG TABS tablet Take 1 tablet (100 mg total) by mouth daily before breakfast. 90 tablet 1  . Cholecalciferol 2000 units TABS Take 1 tablet (2,000 Units total) by mouth daily. 90 tablet 3  . cyanocobalamin 2000 MCG tablet Take 1 tablet (2,000 mcg total) by mouth daily. 90 tablet 3  . glucose blood test strip Use TID 100 each 11  . Insulin Pen Needle (NOVOFINE) 32G X 6 MM MISC 1 Act by Does not apply route daily. 100 each 3  . LORazepam (ATIVAN) 1 MG tablet TAKE 1 TABLET BY MOUTH THREE TIMES A DAY. (11/3, 12/3, 1/2, 2/1, 3/2)  4  . nebivolol (BYSTOLIC) 10 MG tablet Take 1 tablet (10 mg total) by mouth daily. 126 tablet 0  . pantoprazole (PROTONIX) 40 MG tablet TAKE 1 TABLET EVERY DAY 90 tablet 3  . potassium chloride SA (KLOR-CON M20) 20 MEQ tablet Take 1 tablet (20 mEq total) by mouth daily. 90 tablet 3  . repaglinide (PRANDIN) 2 MG tablet Take 1 tablet (2 mg total) by mouth 3 (three) times daily before meals. 90 tablet 11  . sertraline (ZOLOFT) 100 MG tablet  Take 200 mg by mouth daily.   12  . spironolactone (ALDACTONE) 25 MG tablet Take 0.5 tablets (12.5 mg total) by mouth daily. 90 tablet 3  . SUMAtriptan (IMITREX) 50 MG tablet TAKE 1 TABLET BY MOUTH AS NEEDED FOR MIGRAINE, CAN REPEAT IN 2 HOURS IF HEADACHE PERSISTS 10 tablet 5  . telmisartan (MICARDIS) 80 MG tablet Take 1 tablet (80 mg total) by mouth daily. 90 tablet 3  . Tiotropium Bromide-Olodaterol (STIOLTO RESPIMAT) 2.5-2.5 MCG/ACT AERS Inhale 2 puffs into the lungs daily. 4 g 5  . tizanidine (ZANAFLEX) 2 MG capsule TAKE ONE CAPSULE 3 TIMES A DAY 270 capsule 1  . traZODone (DESYREL) 50 MG tablet Reported on 08/07/2015  3  . Fexofenadine HCl (MUCINEX ALLERGY PO) Take by mouth.     No facility-administered medications prior to visit.     ROS Review of Systems  Constitutional: Negative for appetite change, diaphoresis, fatigue, fever and unexpected weight change.  HENT: Negative.   Eyes: Negative for visual disturbance.  Respiratory: Positive for cough. Negative for chest tightness, shortness of breath and wheezing.   Cardiovascular: Negative for chest pain, palpitations and leg swelling.  Gastrointestinal: Negative for abdominal pain, constipation, diarrhea, nausea and vomiting.  Endocrine: Negative.   Genitourinary: Negative.  Negative for difficulty urinating.  Musculoskeletal: Positive for arthralgias. Negative for back pain and neck  pain.  Skin: Negative.  Negative for color change and rash.  Neurological: Negative.  Negative for dizziness, weakness, numbness and headaches.  Hematological: Negative for adenopathy. Does not bruise/bleed easily.  Psychiatric/Behavioral: Negative for confusion, decreased concentration, dysphoric mood, sleep disturbance and suicidal ideas. The patient is nervous/anxious.     Objective:  BP 138/70 (BP Location: Right Arm, Patient Position: Sitting, Cuff Size: Large)   Pulse 72   Temp 97.8 F (36.6 C) (Oral)   Resp 16   Ht 5\' 5"  (1.651 m)   Wt 215  lb 0.6 oz (97.5 kg)   SpO2 98%   BMI 35.78 kg/m   BP Readings from Last 3 Encounters:  09/08/17 138/70  08/29/17 130/80  08/22/17 (!) 140/100    Wt Readings from Last 3 Encounters:  09/08/17 215 lb 0.6 oz (97.5 kg)  08/29/17 212 lb 1.9 oz (96.2 kg)  08/22/17 214 lb (97.1 kg)    Physical Exam  Constitutional: She is oriented to person, place, and time. No distress.  HENT:  Mouth/Throat: Oropharynx is clear and moist. No oropharyngeal exudate.  Eyes: Conjunctivae are normal. Left eye exhibits no discharge. No scleral icterus.  Neck: Normal range of motion. Neck supple. No JVD present. No thyromegaly present.  Cardiovascular: Normal rate, regular rhythm and normal heart sounds. Exam reveals no gallop.  No murmur heard. Pulses:      Carotid pulses are 1+ on the right side, and 1+ on the left side.      Radial pulses are 1+ on the right side, and 1+ on the left side.       Femoral pulses are 1+ on the right side, and 1+ on the left side.      Popliteal pulses are 1+ on the right side, and 1+ on the left side.       Dorsalis pedis pulses are 1+ on the right side, and 1+ on the left side.       Posterior tibial pulses are 1+ on the right side, and 1+ on the left side.  Pulmonary/Chest: Effort normal and breath sounds normal. No respiratory distress. She has no wheezes. She has no rales. She exhibits no mass, no tenderness, no bony tenderness, no edema, no deformity and no swelling. Right breast exhibits no inverted nipple, no mass, no nipple discharge, no skin change and no tenderness. Left breast exhibits no inverted nipple, no mass, no nipple discharge, no skin change and no tenderness. Breasts are symmetrical.  Abdominal: Soft. Bowel sounds are normal. She exhibits no distension and no mass. There is no tenderness.  Musculoskeletal: Normal range of motion. She exhibits no edema or tenderness.  Lymphadenopathy:    She has no cervical adenopathy.  Neurological: She is alert and  oriented to person, place, and time.  Skin: Skin is warm and dry. No rash noted. She is not diaphoretic. No erythema. No pallor.  Psychiatric: She has a normal mood and affect. Her behavior is normal. Judgment and thought content normal.  Vitals reviewed.   Lab Results  Component Value Date   WBC 9.7 08/15/2017   HGB 14.8 08/15/2017   HCT 44.0 08/15/2017   PLT 202.0 08/15/2017   GLUCOSE 173 (H) 09/08/2017   CHOL 186 05/31/2017   TRIG 230.0 (H) 05/31/2017   HDL 50.00 05/31/2017   LDLDIRECT 106.0 05/31/2017   LDLCALC 124 (H) 05/11/2016   ALT 11 01/19/2017   AST 11 01/19/2017   NA 137 09/08/2017   K 3.4 (L) 09/08/2017  CL 101 09/08/2017   CREATININE 0.90 09/08/2017   BUN 17 09/08/2017   CO2 27 09/08/2017   TSH 0.82 08/15/2017   INR 1.33 07/25/2014   HGBA1C 7.1 (H) 08/15/2017   MICROALBUR 3.3 (H) 05/31/2017    Dg Chest 2 View  Result Date: 08/15/2017 CLINICAL DATA:  Cough and congestion. EXAM: CHEST  2 VIEW COMPARISON:  08/01/2017; 12/24/2014 FINDINGS: Grossly unchanged cardiac silhouette and mediastinal contours with atherosclerotic plaque within the thoracic aorta. The lungs appear hyperexpanded with flattening of the diaphragms. Diffuse slightly nodular thickening of the pulmonary interstitium is unchanged. No new focal airspace opacities. No pleural effusion or pneumothorax. No evidence of edema. No acute osseous abnormalities. Post cholecystectomy. Ventriculoperitoneal catheter tubing courses within the subcutaneous tissues of the midline of the chest. IMPRESSION: Similar findings of lung hyperexpansion and chronic bronchitic change without superimposed acute cardiopulmonary disease. Electronically Signed   By: Sandi Mariscal M.D.   On: 08/15/2017 13:23    Assessment & Plan:   Airyanna was seen today for cough and hypertension.  Diagnoses and all orders for this visit:  Essential hypertension- Her blood pressure is adequately well controlled. -     Basic metabolic panel;  Future  Chronic hypokalemia  Drug-induced hypokalemia- Her potassium level has improved up to 3.4.  Will continue Spironolactone and the potassium supplement at the current doses -     Basic metabolic panel; Future  Simple chronic bronchitis ([place])- Will continue Tiotropium at the current dose.  Will add dextromethorphan to suppress the cough. -     Dextromethorphan-Guaifenesin (MUCINEX DM) 30-600 MG TB12; Take 1 tablet by mouth 2 (two) times daily as needed.  Breast pain, right -     MM DIAG BREAST TOMO BILATERAL; Future   I have discontinued Clemon Chambers Fexofenadine HCl (MUCINEX ALLERGY PO). I am also having her start on [place] DM. Additionally, I am having her maintain her traZODone, Cholecalciferol, cyanocobalamin, sertraline, glucose blood, Insulin Pen Needle, repaglinide, pantoprazole, bromocriptine, tizanidine, canagliflozin, atorvastatin, SUMAtriptan, Tiotropium Bromide-Olodaterol, ARIPiprazole, LORazepam, potassium chloride SA, telmisartan, spironolactone, and nebivolol.  Meds ordered this encounter  Medications  . Dextromethorphan-Guaifenesin (MUCINEX DM) 30-600 MG TB12    Sig: Take 1 tablet by mouth 2 (two) times daily as needed.    Dispense:  60 tablet    Refill:  5     Follow-up: Return in about 4 months (around 01/06/2018).  Kiara Calico, MD

## 2017-09-12 ENCOUNTER — Other Ambulatory Visit: Payer: Self-pay | Admitting: Internal Medicine

## 2017-09-12 DIAGNOSIS — N644 Mastodynia: Secondary | ICD-10-CM

## 2017-09-15 ENCOUNTER — Ambulatory Visit (HOSPITAL_COMMUNITY): Payer: Medicare Other | Attending: Internal Medicine

## 2017-09-15 ENCOUNTER — Other Ambulatory Visit: Payer: Self-pay

## 2017-09-15 ENCOUNTER — Encounter (INDEPENDENT_AMBULATORY_CARE_PROVIDER_SITE_OTHER): Payer: Self-pay

## 2017-09-15 DIAGNOSIS — I491 Atrial premature depolarization: Secondary | ICD-10-CM | POA: Diagnosis not present

## 2017-09-15 DIAGNOSIS — I10 Essential (primary) hypertension: Secondary | ICD-10-CM | POA: Insufficient documentation

## 2017-09-15 DIAGNOSIS — R0600 Dyspnea, unspecified: Secondary | ICD-10-CM

## 2017-09-15 DIAGNOSIS — E119 Type 2 diabetes mellitus without complications: Secondary | ICD-10-CM | POA: Diagnosis not present

## 2017-09-15 DIAGNOSIS — R0609 Other forms of dyspnea: Secondary | ICD-10-CM | POA: Diagnosis not present

## 2017-09-15 DIAGNOSIS — I493 Ventricular premature depolarization: Secondary | ICD-10-CM | POA: Diagnosis not present

## 2017-09-15 DIAGNOSIS — Z8249 Family history of ischemic heart disease and other diseases of the circulatory system: Secondary | ICD-10-CM | POA: Insufficient documentation

## 2017-09-16 NOTE — Progress Notes (Signed)
Pt has been made aware of normal result and verbalized understanding.  jw

## 2017-09-20 ENCOUNTER — Ambulatory Visit
Admission: RE | Admit: 2017-09-20 | Discharge: 2017-09-20 | Disposition: A | Discharge status: Home / Self Care | Payer: Medicare Other | Source: Ambulatory Visit | Attending: Internal Medicine | Admitting: Internal Medicine

## 2017-09-20 ENCOUNTER — Ambulatory Visit: Payer: Self-pay

## 2017-09-20 DIAGNOSIS — N644 Mastodynia: Secondary | ICD-10-CM

## 2017-09-22 ENCOUNTER — Ambulatory Visit (INDEPENDENT_AMBULATORY_CARE_PROVIDER_SITE_OTHER)
Admission: RE | Admit: 2017-09-22 | Discharge: 2017-09-22 | Disposition: A | Discharge status: Home / Self Care | Payer: Medicare Other | Source: Ambulatory Visit | Attending: Internal Medicine | Admitting: Internal Medicine

## 2017-09-22 ENCOUNTER — Ambulatory Visit (INDEPENDENT_AMBULATORY_CARE_PROVIDER_SITE_OTHER): Payer: Medicare Other | Admitting: Internal Medicine

## 2017-09-22 ENCOUNTER — Encounter: Payer: Self-pay | Admitting: Internal Medicine

## 2017-09-22 VITALS — BP 160/74 | HR 111 | Temp 99.7°F | Resp 16 | Wt 210.0 lb

## 2017-09-22 DIAGNOSIS — G8929 Other chronic pain: Secondary | ICD-10-CM

## 2017-09-22 DIAGNOSIS — M545 Low back pain, unspecified: Secondary | ICD-10-CM | POA: Insufficient documentation

## 2017-09-22 DIAGNOSIS — J410 Simple chronic bronchitis: Secondary | ICD-10-CM | POA: Diagnosis not present

## 2017-09-22 MED ORDER — GLYCOPYRROLATE 25 MCG/ML IN SOLN: 1.0000 | Freq: Two times a day (BID) | RESPIRATORY_TRACT | 11 refills | Status: DC

## 2017-09-22 MED ORDER — GLYCOPYRROLATE 25 MCG/ML IN SOLN
1.0000 | Freq: Two times a day (BID) | RESPIRATORY_TRACT | 11 refills | Status: DC
Start: 2017-09-22 — End: 2017-10-01

## 2017-09-22 NOTE — Progress Notes (Signed)
Subjective:  Patient ID: [name], female    DOB: 07-Sep-1945  Age: 72 y.o. MRN: [id number]  CC: Cough and Back Pain   HPI [name] presents for concerns about chronic, recurrent episodes of cough that is mostly non- productive.  She rarely brings up phlegm that is pale yellow.  She is using the Tiotropium inhaler but she does not think much is getting into her lungs.  She has not noticed much symptom relief with this.  She denies chest pain, hemoptysis, wheezing, or shortness of breath.  She also complains of chronic, nonradiating, low back pain that she describes as an achy sensation.  She denies paresthesias in her lower extremity.  Outpatient Medications Prior to Visit  Medication Sig Dispense Refill  . ARIPiprazole (ABILIFY) 2 MG tablet Take 1 tablet (2 mg total) by mouth daily. 30 tablet 3  . atorvastatin (LIPITOR) 20 MG tablet Take 1 tablet (20 mg total) by mouth daily. 90 tablet 1  . bromocriptine (PARLODEL) 2.5 MG tablet 1/4 tab daily 8 tablet 11  . canagliflozin (INVOKANA) 100 MG TABS tablet Take 1 tablet (100 mg total) by mouth daily before breakfast. 90 tablet 1  . Cholecalciferol 2000 units TABS Take 1 tablet (2,000 Units total) by mouth daily. 90 tablet 3  . cyanocobalamin 2000 MCG tablet Take 1 tablet (2,000 mcg total) by mouth daily. 90 tablet 3  . Dextromethorphan-Guaifenesin (MUCINEX DM) 30-600 MG TB12 Take 1 tablet by mouth 2 (two) times daily as needed. 60 tablet 5  . glucose blood test strip Use TID 100 each 11  . Insulin Pen Needle (NOVOFINE) 32G X 6 MM MISC 1 Act by Does not apply route daily. 100 each 3  . LORazepam (ATIVAN) 1 MG tablet TAKE 1 TABLET BY MOUTH THREE TIMES A DAY. (11/3, 12/3, 1/2, 2/1, 3/2)  4  . nebivolol (BYSTOLIC) 10 MG tablet Take 1 tablet (10 mg total) by mouth daily. 126 tablet 0  . pantoprazole (PROTONIX) 40 MG tablet TAKE 1 TABLET EVERY DAY 90 tablet 3  . potassium chloride SA (KLOR-CON M20) 20 MEQ tablet Take 1 tablet (20 mEq  total) by mouth daily. 90 tablet 3  . repaglinide (PRANDIN) 2 MG tablet Take 1 tablet (2 mg total) by mouth 3 (three) times daily before meals. 90 tablet 11  . sertraline (ZOLOFT) 100 MG tablet Take 200 mg by mouth daily.   12  . spironolactone (ALDACTONE) 25 MG tablet Take 0.5 tablets (12.5 mg total) by mouth daily. 90 tablet 3  . SUMAtriptan (IMITREX) 50 MG tablet TAKE 1 TABLET BY MOUTH AS NEEDED FOR MIGRAINE, CAN REPEAT IN 2 HOURS IF HEADACHE PERSISTS 10 tablet 5  . telmisartan (MICARDIS) 80 MG tablet Take 1 tablet (80 mg total) by mouth daily. 90 tablet 3  . tizanidine (ZANAFLEX) 2 MG capsule TAKE ONE CAPSULE 3 TIMES A DAY 270 capsule 1  . traZODone (DESYREL) 50 MG tablet Reported on 08/07/2015  3  . Tiotropium Bromide-Olodaterol (STIOLTO RESPIMAT) 2.5-2.5 MCG/ACT AERS Inhale 2 puffs into the lungs daily. 4 g 5   No facility-administered medications prior to visit.     ROS Review of Systems  Constitutional: Negative for appetite change, chills, diaphoresis, fatigue and fever.  HENT: Negative.   Eyes: Negative for visual disturbance.  Respiratory: Positive for cough. Negative for chest tightness, shortness of breath and wheezing.   Cardiovascular: Negative for chest pain, palpitations and leg swelling.  Gastrointestinal: Negative for abdominal pain, constipation, diarrhea, nausea and  vomiting.  Endocrine: Negative.   Genitourinary: Negative.  Negative for difficulty urinating.  Musculoskeletal: Positive for back pain. Negative for arthralgias, myalgias and neck pain.  Skin: Negative.  Negative for color change and rash.  Allergic/Immunologic: Negative.   Neurological: Negative.  Negative for dizziness, weakness and light-headedness.  Hematological: Negative for adenopathy. Does not bruise/bleed easily.  Psychiatric/Behavioral: Negative.     Objective:  BP (!) 160/74 (BP Location: Left Arm, Patient Position: Sitting, Cuff Size: Large)   Pulse (!) 111   Temp 99.7 F (37.6 C) (Oral)    Resp 16   Wt 210 lb (95.3 kg)   SpO2 99%   BMI 34.95 kg/m   BP Readings from Last 3 Encounters:  09/22/17 (!) 160/74  09/08/17 138/70  08/29/17 130/80    Wt Readings from Last 3 Encounters:  09/22/17 210 lb (95.3 kg)  09/08/17 215 lb 0.6 oz (97.5 kg)  08/29/17 212 lb 1.9 oz (96.2 kg)    Physical Exam  Constitutional: She is oriented to person, place, and time. No distress.  HENT:  Mouth/Throat: Oropharynx is clear and moist. No oropharyngeal exudate.  Eyes: Conjunctivae are normal. Left eye exhibits no discharge. No scleral icterus.  Neck: Normal range of motion. Neck supple. No JVD present. No thyromegaly present.  Cardiovascular: Normal rate, regular rhythm and normal heart sounds. Exam reveals no gallop.  No murmur heard. Pulmonary/Chest: Effort normal and breath sounds normal. No respiratory distress. She has no wheezes. She has no rales.  Abdominal: Soft. Bowel sounds are normal. She exhibits no distension and no mass. There is no tenderness. There is no guarding.  Musculoskeletal: Normal range of motion. She exhibits no edema, tenderness or deformity.       Lumbar back: Normal. She exhibits normal range of motion, no tenderness, no bony tenderness, no swelling, no edema and no deformity.  Lymphadenopathy:    She has no cervical adenopathy.  Neurological: She is alert and oriented to person, place, and time. She has normal strength. She displays no atrophy, no tremor and normal reflexes. No sensory deficit. She exhibits normal muscle tone. She displays a negative Romberg sign. She displays no seizure activity. Coordination and gait normal.  Neg SLR in BLE  Skin: Skin is warm and dry. No rash noted. She is not diaphoretic. No erythema. No pallor.  Vitals reviewed.   Lab Results  Component Value Date   WBC 9.7 08/15/2017   HGB 14.8 08/15/2017   HCT 44.0 08/15/2017   PLT 202.0 08/15/2017   GLUCOSE 173 (H) 09/08/2017   CHOL 186 05/31/2017   TRIG 230.0 (H) 05/31/2017     HDL 50.00 05/31/2017   LDLDIRECT 106.0 05/31/2017   LDLCALC 124 (H) 05/11/2016   ALT 11 01/19/2017   AST 11 01/19/2017   NA 137 09/08/2017   K 3.4 (L) 09/08/2017   CL 101 09/08/2017   CREATININE 0.90 09/08/2017   BUN 17 09/08/2017   CO2 27 09/08/2017   TSH 0.82 08/15/2017   INR 1.33 07/25/2014   HGBA1C 7.1 (H) 08/15/2017   MICROALBUR 3.3 (H) 05/31/2017    Mm Diag Breast Tomo Uni Right  Result Date: 09/20/2017 CLINICAL DATA:  72 year old presenting with intermittent diffuse right breast pain and tenderness. EXAM: DIGITAL DIAGNOSTIC UNILATERAL RIGHT MAMMOGRAM WITH CAD AND TOMO COMPARISON:  06/28/2017. ACR Breast Density Category b: There are scattered areas of fibroglandular density. FINDINGS: Tomosynthesis and synthesized full field CC and MLO views of the right breast were obtained. Benign coarse linear secretory type  calcifications throughout the right breast, unchanged. No findings suspicious for malignancy in the right breast. Mammographic images were processed with CAD. IMPRESSION: No mammographic evidence of malignancy involving the right breast. RECOMMENDATION: Annual bilateral screening mammography which is due in late November, 2019. Strategies for alleviating breast pain including decreasing caffeine intake, vitamin-E supplementation, and avoidance of tight compression brassieres, including underwire brassieres, were discussed with the patient. I have discussed the findings and recommendations with the patient. Results were also provided in writing at the conclusion of the visit. If applicable, a reminder letter will be sent to the patient regarding the next appointment. BI-RADS CATEGORY  2: Benign. Electronically Signed   By: Evangeline Dakin M.D.   On: 09/20/2017 14:19   Dg Lumbar Spine Complete  Result Date: 09/22/2017 CLINICAL DATA:  Low back pain, cough for 2 weeks, no injury EXAM: LUMBAR SPINE - COMPLETE 4+ VIEW COMPARISON:  CT abdomen pelvis of 02/22/2014 FINDINGS: There is  anterolisthesis of L4 on L5 by 10 mm which appears to be due to degenerative change of the facet joints of L4-5 and L5-S1. Intervertebral disc spaces appear normal. No compression deformity is seen. This anterolisthesis may have increased somewhat compared to the sagittal images from CT of 2015. The SI joints appear corticated. IMPRESSION: 1. 10 mm anterolisthesis of L4 on L5 which appears to have increased compared to the CT images from 2015, most likely due to degenerative change of the facet joints. 2. 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 Electronically Signed   By: Ivar Drape M.D.   On: 09/22/2017 09:26    Assessment & Plan:   Kaylinn was seen today for cough and back pain.  Diagnoses and all orders for this visit:  Simple chronic bronchitis ([place])- I am concerned she is not technically competent to use the Tiotropium inhaler so I have asked her to upgrade to nebulized LAMA therapy. -     Glycopyrrolate (LONHALA MAGNAIR REFILL KIT) 25 MCG/ML SOLN; Inhale 1 puff into the lungs 2 (two) times daily. -     Glycopyrrolate (LONHALA MAGNAIR STARTER KIT) 25 MCG/ML SOLN; Inhale 1 puff into the lungs 2 (two) times daily.  Chronic midline low back pain, with sciatica presence unspecified- Her plain film shows a 10 mm anterolisthesis at L4-L5 which has been present for at least 3 years.  There are also some degenerative changes.  She is neurologically intact.  She is getting symptom relief with over-the-counter remedies such as Tylenol and ibuprofen. -     DG Lumbar Spine Complete; Future   I have discontinued Karen Chafe. [place]'s Tiotropium Bromide-Olodaterol. I am also having her start on Glycopyrrolate and Glycopyrrolate. Additionally, I am having her maintain her traZODone, Cholecalciferol, cyanocobalamin, sertraline, glucose blood, Insulin Pen Needle, repaglinide, pantoprazole, bromocriptine, tizanidine, canagliflozin, atorvastatin, SUMAtriptan, ARIPiprazole, LORazepam, potassium  chloride SA, telmisartan, spironolactone, nebivolol, and MUCINEX DM.  Meds ordered this encounter  Medications  . Glycopyrrolate (LONHALA MAGNAIR REFILL KIT) 25 MCG/ML SOLN    Sig: Inhale 1 puff into the lungs 2 (two) times daily.    Dispense:  60 mL    Refill:  11  . Glycopyrrolate (LONHALA MAGNAIR STARTER KIT) 25 MCG/ML SOLN    Sig: Inhale 1 puff into the lungs 2 (two) times daily.    Dispense:  60 mL    Refill:  11     Follow-up: Return in about 4 weeks (around 10/20/2017).  [name], MD

## 2017-09-22 NOTE — Patient Instructions (Signed)

## 2017-09-27 ENCOUNTER — Ambulatory Visit (INDEPENDENT_AMBULATORY_CARE_PROVIDER_SITE_OTHER): Payer: Medicare Other | Admitting: Internal Medicine

## 2017-09-27 ENCOUNTER — Ambulatory Visit: Payer: Self-pay | Admitting: Internal Medicine

## 2017-09-27 ENCOUNTER — Encounter: Payer: Self-pay | Admitting: Internal Medicine

## 2017-09-27 VITALS — BP 150/70 | HR 89 | Temp 98.4°F | Resp 16 | Ht 65.0 in | Wt 210.0 lb

## 2017-09-27 DIAGNOSIS — G8929 Other chronic pain: Secondary | ICD-10-CM

## 2017-09-27 DIAGNOSIS — M5440 Lumbago with sciatica, unspecified side: Secondary | ICD-10-CM | POA: Diagnosis not present

## 2017-09-27 DIAGNOSIS — M170 Bilateral primary osteoarthritis of knee: Secondary | ICD-10-CM

## 2017-09-27 MED ORDER — ETODOLAC ER 400 MG PO TB24: 400.0000 mg | ORAL_TABLET | Freq: Every day | ORAL | 0 refills | Status: DC

## 2017-09-27 NOTE — Patient Instructions (Signed)

## 2017-09-27 NOTE — Progress Notes (Signed)
Subjective:  Patient ID: [name], female    DOB: 1946/01/03  Age: 72 y.o. MRN: [id number]  CC: Back Pain and Osteoarthritis   HPI [name] presents for the complaint of low back pain and chronic knee pain.  She is taking Tylenol which has not helped.  She has a strange history with respect to NSAIDs.  She says that Aleve caused hives but she is taking aspirin and ibuprofen with no side effects.  She tells me the back pain does not radiate into her lower extremities and she denies paresthesias in her lower extremities.  She has degenerative changes and a spondylolisthesis on her recent plain films.  Outpatient Medications Prior to Visit  Medication Sig Dispense Refill  . ARIPiprazole (ABILIFY) 2 MG tablet Take 1 tablet (2 mg total) by mouth daily. 30 tablet 3  . atorvastatin (LIPITOR) 20 MG tablet Take 1 tablet (20 mg total) by mouth daily. 90 tablet 1  . bromocriptine (PARLODEL) 2.5 MG tablet 1/4 tab daily 8 tablet 11  . canagliflozin (INVOKANA) 100 MG TABS tablet Take 1 tablet (100 mg total) by mouth daily before breakfast. 90 tablet 1  . Cholecalciferol 2000 units TABS Take 1 tablet (2,000 Units total) by mouth daily. 90 tablet 3  . cyanocobalamin 2000 MCG tablet Take 1 tablet (2,000 mcg total) by mouth daily. 90 tablet 3  . Dextromethorphan-Guaifenesin (MUCINEX DM) 30-600 MG TB12 Take 1 tablet by mouth 2 (two) times daily as needed. 60 tablet 5  . glucose blood test strip Use TID 100 each 11  . Insulin Pen Needle (NOVOFINE) 32G X 6 MM MISC 1 Act by Does not apply route daily. 100 each 3  . LORazepam (ATIVAN) 1 MG tablet TAKE 1 TABLET BY MOUTH THREE TIMES A DAY. (11/3, 12/3, 1/2, 2/1, 3/2)  4  . nebivolol (BYSTOLIC) 10 MG tablet Take 1 tablet (10 mg total) by mouth daily. 126 tablet 0  . pantoprazole (PROTONIX) 40 MG tablet TAKE 1 TABLET EVERY DAY 90 tablet 3  . potassium chloride SA (KLOR-CON M20) 20 MEQ tablet Take 1 tablet (20 mEq total) by mouth daily. 90 tablet 3    . repaglinide (PRANDIN) 2 MG tablet Take 1 tablet (2 mg total) by mouth 3 (three) times daily before meals. 90 tablet 11  . sertraline (ZOLOFT) 100 MG tablet Take 200 mg by mouth daily.   12  . spironolactone (ALDACTONE) 25 MG tablet Take 0.5 tablets (12.5 mg total) by mouth daily. 90 tablet 3  . SUMAtriptan (IMITREX) 50 MG tablet TAKE 1 TABLET BY MOUTH AS NEEDED FOR MIGRAINE, CAN REPEAT IN 2 HOURS IF HEADACHE PERSISTS 10 tablet 5  . telmisartan (MICARDIS) 80 MG tablet Take 1 tablet (80 mg total) by mouth daily. 90 tablet 3  . tizanidine (ZANAFLEX) 2 MG capsule TAKE ONE CAPSULE 3 TIMES A DAY 270 capsule 1  . traZODone (DESYREL) 50 MG tablet Reported on 08/07/2015  3  . Glycopyrrolate (LONHALA MAGNAIR REFILL KIT) 25 MCG/ML SOLN Inhale 1 puff into the lungs 2 (two) times daily. (Patient not taking: Reported on 09/27/2017) 60 mL 11  . Glycopyrrolate (LONHALA MAGNAIR STARTER KIT) 25 MCG/ML SOLN Inhale 1 puff into the lungs 2 (two) times daily. (Patient not taking: Reported on 09/27/2017) 60 mL 11   No facility-administered medications prior to visit.     ROS Review of Systems  Constitutional: Negative.  Negative for chills, diaphoresis, fatigue and fever.  HENT: Negative.   Eyes: Negative.  Respiratory: Negative for cough, chest tightness, shortness of breath and wheezing.   Cardiovascular: Negative for chest pain, palpitations and leg swelling.  Gastrointestinal: Negative for abdominal pain, constipation, diarrhea, nausea and vomiting.  Endocrine: Negative.   Genitourinary: Negative.   Musculoskeletal: Positive for arthralgias and back pain. Negative for myalgias and neck pain.  Skin: Negative.   Allergic/Immunologic: Negative.   Neurological: Negative for dizziness, weakness, light-headedness and numbness.  Hematological: Negative for adenopathy. Does not bruise/bleed easily.  Psychiatric/Behavioral: Negative.     Objective:  BP (!) 150/70 (BP Location: Left Arm, Patient Position:  Sitting, Cuff Size: Large)   Pulse 89   Temp 98.4 F (36.9 C) (Oral)   Resp 16   Ht 5' 5" (1.651 m)   Wt 210 lb (95.3 kg)   SpO2 96%   BMI 34.95 kg/m   BP Readings from Last 3 Encounters:  09/27/17 (!) 150/70  09/22/17 (!) 160/74  09/08/17 138/70    Wt Readings from Last 3 Encounters:  09/27/17 210 lb (95.3 kg)  09/22/17 210 lb (95.3 kg)  09/08/17 215 lb 0.6 oz (97.5 kg)    Physical Exam  Constitutional: She is oriented to person, place, and time. No distress.  HENT:  Mouth/Throat: Oropharynx is clear and moist. No oropharyngeal exudate.  Eyes: Conjunctivae are normal. Left eye exhibits no discharge. No scleral icterus.  Neck: Normal range of motion. Neck supple. No JVD present. No thyromegaly present.  Cardiovascular: Normal rate, regular rhythm and normal heart sounds. Exam reveals no gallop.  No murmur heard. Pulmonary/Chest: Effort normal and breath sounds normal. No respiratory distress. She has no wheezes. She has no rales.  Abdominal: Soft. Bowel sounds are normal. She exhibits no distension and no mass. There is no tenderness. There is no guarding.  Musculoskeletal: Normal range of motion. She exhibits no edema, tenderness or deformity.  Lymphadenopathy:    She has no cervical adenopathy.  Neurological: She is alert and oriented to person, place, and time. She has normal reflexes. She displays normal reflexes. No cranial nerve deficit. She exhibits normal muscle tone. Coordination normal.  Neg SLR in BLE  Skin: Skin is warm and dry. No rash noted. She is not diaphoretic. No erythema. No pallor.  Vitals reviewed.   Lab Results  Component Value Date   WBC 9.7 08/15/2017   HGB 14.8 08/15/2017   HCT 44.0 08/15/2017   PLT 202.0 08/15/2017   GLUCOSE 173 (H) 09/08/2017   CHOL 186 05/31/2017   TRIG 230.0 (H) 05/31/2017   HDL 50.00 05/31/2017   LDLDIRECT 106.0 05/31/2017   LDLCALC 124 (H) 05/11/2016   ALT 11 01/19/2017   AST 11 01/19/2017   NA 137 09/08/2017    K 3.4 (L) 09/08/2017   CL 101 09/08/2017   CREATININE 0.90 09/08/2017   BUN 17 09/08/2017   CO2 27 09/08/2017   TSH 0.82 08/15/2017   INR 1.33 07/25/2014   HGBA1C 7.1 (H) 08/15/2017   MICROALBUR 3.3 (H) 05/31/2017    Dg Lumbar Spine Complete  Result Date: 09/22/2017 CLINICAL DATA:  Low back pain, cough for 2 weeks, no injury EXAM: LUMBAR SPINE - COMPLETE 4+ VIEW COMPARISON:  CT abdomen pelvis of 02/22/2014 FINDINGS: There is anterolisthesis of L4 on L5 by 10 mm which appears to be due to degenerative change of the facet joints of L4-5 and L5-S1. Intervertebral disc spaces appear normal. No compression deformity is seen. This anterolisthesis may have increased somewhat compared to the sagittal images from CT of 2015. The   SI joints appear corticated. IMPRESSION: 1. 10 mm anterolisthesis of L4 on L5 which appears to have increased compared to the CT images from 2015, most likely due to degenerative change of the facet joints. 2. 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 Electronically Signed   By: Paul  Barry M.D.   On: 09/22/2017 09:26    Assessment & Plan:   Armida was seen today for back pain and osteoarthritis.  Diagnoses and all orders for this visit:  Chronic midline low back pain with sciatica, sciatica laterality unspecified -     Cancel: Ambulatory referral to Physical Therapy -     etodolac (LODINE XL) 400 MG 24 hr tablet; Take 1 tablet (400 mg total) by mouth daily. -     Ambulatory referral to Physical Therapy  Primary osteoarthritis of both knees -     etodolac (LODINE XL) 400 MG 24 hr tablet; Take 1 tablet (400 mg total) by mouth daily. -     Ambulatory referral to Physical Therapy   I am having Keanu G. Ballinger start on etodolac. I am also having her maintain her traZODone, Cholecalciferol, cyanocobalamin, sertraline, glucose blood, Insulin Pen Needle, repaglinide, pantoprazole, bromocriptine, tizanidine, canagliflozin, atorvastatin, SUMAtriptan,  ARIPiprazole, LORazepam, potassium chloride SA, telmisartan, spironolactone, nebivolol, MUCINEX DM, Glycopyrrolate, and Glycopyrrolate.  Meds ordered this encounter  Medications  . etodolac (LODINE XL) 400 MG 24 hr tablet    Sig: Take 1 tablet (400 mg total) by mouth daily.    Dispense:  90 tablet    Refill:  0     Follow-up: Return in about 3 months (around 12/25/2017).   , MD 

## 2017-09-30 ENCOUNTER — Telehealth: Payer: Self-pay | Admitting: Internal Medicine

## 2017-09-30 ENCOUNTER — Ambulatory Visit: Payer: Self-pay | Admitting: *Deleted

## 2017-09-30 NOTE — Telephone Encounter (Signed)
Copied from [place] [id number]. Topic: Quick Communication - See Telephone Encounter >> Sep 30, 2017  2:35 PM Hewitt Shorts wrote: CRM for notification. See Telephone encounter for: [facility] is calling stating that they can not get in touch with the patient and is asking that the office  call the pt to let her know that the medication Lonhala and for her to call the number on the invoice to let her know that she received it the operater from [facility] would not give me the number  09/30/17.

## 2017-09-30 NOTE — Telephone Encounter (Signed)
Per laura murray,NP, ok to have patient take 10 units Toujea and patient has scheduled appt to see dr Ronnald Ramp tomorrow---keep checking sugars periodically and go to ED if you become symptomatic or sugars become uncontrolled---patient is currently on steroids for bronchitis which is likely cause for sugar increase

## 2017-09-30 NOTE — Telephone Encounter (Addendum)
Also see initial telephone encounter dated 09/30/17 at 1339. Reason for Disposition . Caller has URGENT medication or insulin pump question and triager unable to answer question  Answer Assessment - Initial Assessment Questions 1. BLOOD GLUCOSE: "What is your blood glucose level?"      301 2. ONSET: "When did you check the blood glucose?"     A little bit ago 3. USUAL RANGE: "What is your glucose level usually?" (e.g., usual fasting morning value, usual evening value)      4. KETONES: "Do you check for ketones (urine or blood test strips)?" If yes, ask: "What does the test show now?"      5. TYPE 1 or 2:  "Do you know what type of diabetes you have?"  (e.g., Type 1, Type 2, Gestational; doesn't know)     Type 2 6. INSULIN: "Do you take insulin?" If yes, ask: "Have you missed any shots recently?"    Not sure  7. DIABETES PILLS: "Do you take any pills for your diabetes?" If yes, ask: "Have you missed taking any pills recently?"     yes 8. OTHER SYMPTOMS: "Do you have any symptoms?" (e.g., fever, frequent urination, difficulty breathing, dizziness, weakness, vomiting)      9. PREGNANCY: "Is there any chance you are pregnant?" "When was your last menstrual period?"    no  Protocols used: DIABETES - HIGH BLOOD SUGAR-A-AH

## 2017-09-30 NOTE — Telephone Encounter (Signed)
Pt called wanting to know if she should take her insulin if her blood sugar is high? She states that her blood sugar is 301; no insulin on medication list; conference call initiated with Sam at [facility]; the pt states that she took toujeo but was not sure how much she took; Triage RN disconnected call when connected with Jonelle Sidle RN at [place]; will route to office for notification of this encounter.

## 2017-10-01 ENCOUNTER — Other Ambulatory Visit: Payer: Self-pay

## 2017-10-01 ENCOUNTER — Encounter: Payer: Self-pay | Admitting: Internal Medicine

## 2017-10-01 ENCOUNTER — Ambulatory Visit: Payer: Medicare Other | Admitting: Internal Medicine

## 2017-10-01 VITALS — BP 126/82 | HR 94 | Temp 98.4°F | Resp 16 | Ht 65.0 in | Wt 211.0 lb

## 2017-10-01 DIAGNOSIS — E118 Type 2 diabetes mellitus with unspecified complications: Secondary | ICD-10-CM

## 2017-10-01 LAB — POCT GLYCOSYLATED HEMOGLOBIN (HGB A1C): Hemoglobin A1C: 6.9

## 2017-10-01 LAB — POCT CBG (FASTING - GLUCOSE)-MANUAL ENTRY: Glucose Fasting, POC: 188 mg/dL — AB (ref 70–99)

## 2017-10-01 MED ORDER — SITAGLIPTIN PHOSPHATE 100 MG PO TABS: 100.0000 mg | ORAL_TABLET | Freq: Every day | ORAL | 1 refills | Status: DC

## 2017-10-01 NOTE — Progress Notes (Signed)
Subjective:  Patient ID: [name], female    DOB: Oct 25, 1945  Age: 72 y.o. MRN: [id number]  CC: Hyperglycemia   HPI [name] presents for f/up - She called one day prior to this visit concerned that her blood pressure had gone up to 301.  She was asymptomatic at the time.  In fact, she tells me she has felt well lately.  She treated the hyperglycemia with an injection of insulin.  She is taking the SGLT-2 inhibitor but she says she has recently run out of the DPP4 inhibitor and has not been taking it.  Outpatient Medications Prior to Visit  Medication Sig Dispense Refill  . ARIPiprazole (ABILIFY) 2 MG tablet Take 1 tablet (2 mg total) by mouth daily. 30 tablet 3  . atorvastatin (LIPITOR) 20 MG tablet Take 1 tablet (20 mg total) by mouth daily. 90 tablet 1  . bromocriptine (PARLODEL) 2.5 MG tablet 1/4 tab daily 8 tablet 11  . canagliflozin (INVOKANA) 100 MG TABS tablet Take 1 tablet (100 mg total) by mouth daily before breakfast. 90 tablet 1  . Cholecalciferol 2000 units TABS Take 1 tablet (2,000 Units total) by mouth daily. 90 tablet 3  . cyanocobalamin 2000 MCG tablet Take 1 tablet (2,000 mcg total) by mouth daily. 90 tablet 3  . Dextromethorphan-Guaifenesin (MUCINEX DM) 30-600 MG TB12 Take 1 tablet by mouth 2 (two) times daily as needed. 60 tablet 5  . etodolac (LODINE XL) 400 MG 24 hr tablet Take 1 tablet (400 mg total) by mouth daily. 90 tablet 0  . glucose blood test strip Use TID 100 each 11  . Glycopyrrolate (LONHALA MAGNAIR REFILL KIT) 25 MCG/ML SOLN Inhale 1 puff into the lungs 2 (two) times daily. 60 mL 11  . Insulin Pen Needle (NOVOFINE) 32G X 6 MM MISC 1 Act by Does not apply route daily. 100 each 3  . LORazepam (ATIVAN) 1 MG tablet TAKE 1 TABLET BY MOUTH THREE TIMES A DAY. (11/3, 12/3, 1/2, 2/1, 3/2)  4  . nebivolol (BYSTOLIC) 10 MG tablet Take 1 tablet (10 mg total) by mouth daily. 126 tablet 0  . pantoprazole (PROTONIX) 40 MG tablet TAKE 1 TABLET EVERY DAY  90 tablet 3  . potassium chloride SA (KLOR-CON M20) 20 MEQ tablet Take 1 tablet (20 mEq total) by mouth daily. 90 tablet 3  . repaglinide (PRANDIN) 2 MG tablet Take 1 tablet (2 mg total) by mouth 3 (three) times daily before meals. 90 tablet 11  . sertraline (ZOLOFT) 100 MG tablet Take 200 mg by mouth daily.   12  . spironolactone (ALDACTONE) 25 MG tablet Take 0.5 tablets (12.5 mg total) by mouth daily. 90 tablet 3  . SUMAtriptan (IMITREX) 50 MG tablet TAKE 1 TABLET BY MOUTH AS NEEDED FOR MIGRAINE, CAN REPEAT IN 2 HOURS IF HEADACHE PERSISTS 10 tablet 5  . telmisartan (MICARDIS) 80 MG tablet Take 1 tablet (80 mg total) by mouth daily. 90 tablet 3  . tizanidine (ZANAFLEX) 2 MG capsule TAKE ONE CAPSULE 3 TIMES A DAY 270 capsule 1  . traZODone (DESYREL) 50 MG tablet Reported on 08/07/2015  3  . Glycopyrrolate (LONHALA MAGNAIR STARTER KIT) 25 MCG/ML SOLN Inhale 1 puff into the lungs 2 (two) times daily. 60 mL 11   No facility-administered medications prior to visit.     ROS Review of Systems  Constitutional: Negative.  Negative for diaphoresis and fatigue.  HENT: Negative.  Negative for trouble swallowing.   Eyes: Negative.  Respiratory: Positive for cough. Negative for chest tightness, shortness of breath, wheezing and stridor.   Cardiovascular: Negative for chest pain, palpitations and leg swelling.  Gastrointestinal: Negative for abdominal pain, constipation, diarrhea, nausea and vomiting.  Endocrine: Negative.  Negative for polyphagia and polyuria.  Genitourinary: Negative.  Negative for difficulty urinating.  Musculoskeletal: Positive for back pain. Negative for arthralgias, myalgias and neck pain.  Skin: Negative.  Negative for color change, pallor and rash.  Allergic/Immunologic: Negative.   Neurological: Negative.  Negative for dizziness, weakness and light-headedness.  Hematological: Negative for adenopathy. Does not bruise/bleed easily.  Psychiatric/Behavioral: Negative.      Objective:  BP 126/82   Pulse 94   Temp 98.4 F (36.9 C) (Oral)   Resp 16   Ht _0  (1.651 m)   Wt 211 lb (95.7 kg)   SpO2 97%   BMI 35.11 kg/m   BP Readings from Last 3 Encounters:  10/01/17 126/82  09/27/17 (!) 150/70  09/22/17 (!) 160/74    Wt Readings from Last 3 Encounters:  10/01/17 211 lb (95.7 kg)  09/27/17 210 lb (95.3 kg)  09/22/17 210 lb (95.3 kg)    Physical Exam  Constitutional: She is oriented to person, place, and time. No distress.  HENT:  Mouth/Throat: Oropharynx is clear and moist. No oropharyngeal exudate.  Eyes: Conjunctivae are normal. Left eye exhibits no discharge. No scleral icterus.  Neck: Neck supple. No JVD present. No thyromegaly present.  Cardiovascular: Normal rate, regular rhythm and normal heart sounds. Exam reveals no gallop and no friction rub.  No murmur heard. Pulmonary/Chest: Effort normal and breath sounds normal. No respiratory distress. She has no wheezes. She has no rales.  Abdominal: Soft. Bowel sounds are normal. She exhibits no distension and no mass. There is no tenderness. There is no guarding.  Musculoskeletal: Normal range of motion. She exhibits no edema, tenderness or deformity.  Lymphadenopathy:    She has no cervical adenopathy.  Neurological: She is alert and oriented to person, place, and time.  Skin: Skin is warm and dry. No rash noted. She is not diaphoretic. No erythema.  Vitals reviewed.   Lab Results  Component Value Date   WBC 9.7 08/15/2017   HGB 14.8 08/15/2017   HCT 44.0 08/15/2017   PLT 202.0 08/15/2017   GLUCOSE 173 (H) 09/08/2017   CHOL 186 05/31/2017   TRIG 230.0 (H) 05/31/2017   HDL 50.00 05/31/2017   LDLDIRECT 106.0 05/31/2017   LDLCALC 124 (H) 05/11/2016   ALT 11 01/19/2017   AST 11 01/19/2017   NA 137 09/08/2017   K 3.4 (L) 09/08/2017   CL 101 09/08/2017   CREATININE 0.90 09/08/2017   BUN 17 09/08/2017   CO2 27 09/08/2017   TSH 0.82 08/15/2017   INR 1.33 07/25/2014   HGBA1C  6.9 10/01/2017   MICROALBUR 3.3 (H) 05/31/2017    Dg Lumbar Spine Complete  Result Date: 09/22/2017 CLINICAL DATA:  Low back pain, cough for 2 weeks, no injury EXAM: LUMBAR SPINE - COMPLETE 4+ VIEW COMPARISON:  CT abdomen pelvis of 02/22/2014 FINDINGS: There is anterolisthesis of L4 on L5 by 10 mm which appears to be due to degenerative change of the facet joints of L4-5 and L5-S1. Intervertebral disc spaces appear normal. No compression deformity is seen. This anterolisthesis may have increased somewhat compared to the sagittal images from CT of 2015. The SI joints appear corticated. IMPRESSION: 1. 10 mm anterolisthesis of L4 on L5 which appears to have increased compared to the  CT images from 2015, most likely due to degenerative change of the facet joints. 2. 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 Electronically Signed   By: Ivar Drape M.D.   On: 09/22/2017 09:26    Assessment & Plan:   Brittanny was seen today for hyperglycemia.  Diagnoses and all orders for this visit:  Type 2 diabetes mellitus with complication, without long-term current use of insulin ([place])- Her postprandial blood sugar today is 188 and her A1c is 6.9%.  She will continue with the insulin as directed and will continue taking the SGLT-2 inhibitor.  Will also restart the DPP4 inhibitor. -     POCT CBG (Fasting - Glucose) -     POCT HgB A1C -     sitaGLIPtin (JANUVIA) 100 MG tablet; Take 1 tablet (100 mg total) by mouth daily.   I am having [name] start on sitaGLIPtin. I am also having her maintain her traZODone, Cholecalciferol, cyanocobalamin, sertraline, glucose blood, Insulin Pen Needle, repaglinide, pantoprazole, bromocriptine, tizanidine, canagliflozin, atorvastatin, SUMAtriptan, ARIPiprazole, LORazepam, potassium chloride SA, telmisartan, spironolactone, nebivolol, MUCINEX DM, Glycopyrrolate, and etodolac.  Meds ordered this encounter  Medications  . sitaGLIPtin (JANUVIA) 100 MG tablet     Sig: Take 1 tablet (100 mg total) by mouth daily.    Dispense:  90 tablet    Refill:  1     Follow-up: Return in about 4 months (around 01/31/2018).  [name], MD

## 2017-10-01 NOTE — Patient Instructions (Signed)

## 2017-10-05 NOTE — Telephone Encounter (Signed)
Pt came in office today. She was given the information for [facility]. Pt requested a letter for her apartment complex. Letter was written RE: pt dx's and that she was under Dr. Ronnald Ramp care.

## 2017-10-08 ENCOUNTER — Other Ambulatory Visit: Payer: Self-pay | Admitting: Internal Medicine

## 2017-10-08 DIAGNOSIS — M5412 Radiculopathy, cervical region: Secondary | ICD-10-CM

## 2017-10-10 ENCOUNTER — Ambulatory Visit (INDEPENDENT_AMBULATORY_CARE_PROVIDER_SITE_OTHER): Payer: Medicare Other | Admitting: Internal Medicine

## 2017-10-10 ENCOUNTER — Encounter: Payer: Self-pay | Admitting: Internal Medicine

## 2017-10-10 VITALS — BP 138/78 | HR 72 | Temp 97.4°F | Resp 16 | Ht 65.0 in | Wt 215.1 lb

## 2017-10-10 DIAGNOSIS — N3281 Overactive bladder: Secondary | ICD-10-CM

## 2017-10-10 DIAGNOSIS — I10 Essential (primary) hypertension: Secondary | ICD-10-CM

## 2017-10-10 MED ORDER — MIRABEGRON ER 50 MG PO TB24: 50.0000 mg | ORAL_TABLET | Freq: Every day | ORAL | 1 refills | Status: DC

## 2017-10-10 NOTE — Progress Notes (Signed)
Subjective:  Patient ID: [name], female    DOB: [birth year]  Age: 72 y.o. MRN: [id number]  CC: Urinary Frequency   HPI ELY SPRAGG presents for f/up - She complains of urinary frequency, urgency, and a couple of episodes of overflow incontinence.  She gets up about 3-4 times a night to urinate.  She was previously diagnosed with overactive bladder but for some reason has not been treating it recently.  Outpatient Medications Prior to Visit  Medication Sig Dispense Refill  . ARIPiprazole (ABILIFY) 2 MG tablet Take 1 tablet (2 mg total) by mouth daily. 30 tablet 3  . atorvastatin (LIPITOR) 20 MG tablet Take 1 tablet (20 mg total) by mouth daily. 90 tablet 1  . bromocriptine (PARLODEL) 2.5 MG tablet 1/4 tab daily 8 tablet 11  . canagliflozin (INVOKANA) 100 MG TABS tablet Take 1 tablet (100 mg total) by mouth daily before breakfast. 90 tablet 1  . Cholecalciferol 2000 units TABS Take 1 tablet (2,000 Units total) by mouth daily. 90 tablet 3  . cyanocobalamin 2000 MCG tablet Take 1 tablet (2,000 mcg total) by mouth daily. 90 tablet 3  . Dextromethorphan-Guaifenesin (MUCINEX DM) 30-600 MG TB12 Take 1 tablet by mouth 2 (two) times daily as needed. 60 tablet 5  . etodolac (LODINE XL) 400 MG 24 hr tablet Take 1 tablet (400 mg total) by mouth daily. 90 tablet 0  . glucose blood test strip Use TID 100 each 11  . Glycopyrrolate (LONHALA MAGNAIR REFILL KIT) 25 MCG/ML SOLN Inhale 1 puff into the lungs 2 (two) times daily. 60 mL 11  . Insulin Pen Needle (NOVOFINE) 32G X 6 MM MISC 1 Act by Does not apply route daily. 100 each 3  . LORazepam (ATIVAN) 1 MG tablet TAKE 1 TABLET BY MOUTH THREE TIMES A DAY. (11/3, 12/3, 1/2, 2/1, 3/2)  4  . nebivolol (BYSTOLIC) 10 MG tablet Take 1 tablet (10 mg total) by mouth daily. 126 tablet 0  . pantoprazole (PROTONIX) 40 MG tablet TAKE 1 TABLET EVERY DAY 90 tablet 3  . potassium chloride SA (KLOR-CON M20) 20 MEQ tablet Take 1 tablet (20 mEq total) by  mouth daily. 90 tablet 3  . repaglinide (PRANDIN) 2 MG tablet Take 1 tablet (2 mg total) by mouth 3 (three) times daily before meals. 90 tablet 11  . sertraline (ZOLOFT) 100 MG tablet Take 200 mg by mouth daily.   12  . sitaGLIPtin (JANUVIA) 100 MG tablet Take 1 tablet (100 mg total) by mouth daily. 90 tablet 1  . spironolactone (ALDACTONE) 25 MG tablet Take 0.5 tablets (12.5 mg total) by mouth daily. 90 tablet 3  . SUMAtriptan (IMITREX) 50 MG tablet TAKE 1 TABLET BY MOUTH AS NEEDED FOR MIGRAINE, CAN REPEAT IN 2 HOURS IF HEADACHE PERSISTS 10 tablet 5  . telmisartan (MICARDIS) 80 MG tablet Take 1 tablet (80 mg total) by mouth daily. 90 tablet 3  . tizanidine (ZANAFLEX) 2 MG capsule TAKE ONE CAPSULE 3 TIMES A DAY 270 capsule 1  . traZODone (DESYREL) 50 MG tablet Reported on 08/07/2015  3   No facility-administered medications prior to visit.     ROS Review of Systems  Constitutional: Negative.  Negative for chills, diaphoresis, fatigue and fever.  HENT: Negative.   Respiratory: Negative.  Negative for cough, chest tightness, shortness of breath and wheezing.   Cardiovascular: Negative for chest pain, palpitations and leg swelling.  Gastrointestinal: Negative for abdominal pain, constipation, diarrhea, nausea and vomiting.  Genitourinary: Positive  for frequency and urgency. Negative for decreased urine volume, difficulty urinating, dysuria, enuresis, flank pain, hematuria and pelvic pain.  Musculoskeletal: Positive for back pain. Negative for myalgias.  Skin: Negative.  Negative for color change, pallor and rash.  Neurological: Negative.  Negative for dizziness, weakness and light-headedness.  Hematological: Negative for adenopathy. Does not bruise/bleed easily.  Psychiatric/Behavioral: Negative.     Objective:  BP 138/78 (BP Location: Left Arm, Patient Position: Sitting, Cuff Size: Large)   Pulse 72   Temp (!) 97.4 F (36.3 C) (Oral)   Resp 16   Ht '5\' 5"'$  (1.651 m)   Wt 215 lb 1.3 oz  (97.6 kg)   SpO2 98%   BMI 35.79 kg/m   BP Readings from Last 3 Encounters:  10/10/17 138/78  10/01/17 126/82  09/27/17 (!) 150/70    Wt Readings from Last 3 Encounters:  10/10/17 215 lb 1.3 oz (97.6 kg)  10/01/17 211 lb (95.7 kg)  09/27/17 210 lb (95.3 kg)    Physical Exam  Constitutional: She is oriented to person, place, and time. No distress.  HENT:  Mouth/Throat: Oropharynx is clear and moist. No oropharyngeal exudate.  Eyes: Conjunctivae are normal. Left eye exhibits no discharge. No scleral icterus.  Neck: Normal range of motion. Neck supple. No JVD present. No thyromegaly present.  Cardiovascular: Normal rate, regular rhythm and normal heart sounds. Exam reveals no gallop and no friction rub.  No murmur heard. Pulmonary/Chest: Effort normal and breath sounds normal. No respiratory distress. She has no wheezes. She has no rales.  Abdominal: Soft. Bowel sounds are normal. She exhibits no distension and no mass. There is no tenderness. There is no guarding.  Musculoskeletal: Normal range of motion. She exhibits no edema, tenderness or deformity.  Lymphadenopathy:    She has no cervical adenopathy.  Neurological: She is alert and oriented to person, place, and time.  Skin: Skin is warm and dry. No rash noted. She is not diaphoretic. No erythema. No pallor.  Psychiatric: She has a normal mood and affect. Her behavior is normal. Judgment and thought content normal.  Vitals reviewed.   Lab Results  Component Value Date   WBC 9.7 08/15/2017   HGB 14.8 08/15/2017   HCT 44.0 08/15/2017   PLT 202.0 08/15/2017   GLUCOSE 173 (H) 09/08/2017   CHOL 186 05/31/2017   TRIG 230.0 (H) 05/31/2017   HDL 50.00 05/31/2017   LDLDIRECT 106.0 05/31/2017   LDLCALC 124 (H) 05/11/2016   ALT 11 01/19/2017   AST 11 01/19/2017   NA 137 09/08/2017   K 3.4 (L) 09/08/2017   CL 101 09/08/2017   CREATININE 0.90 09/08/2017   BUN 17 09/08/2017   CO2 27 09/08/2017   TSH 0.82 08/15/2017   INR  1.33 07/25/2014   HGBA1C 6.9 10/01/2017   MICROALBUR 3.3 (H) 05/31/2017    Dg Lumbar Spine Complete  Result Date: 09/22/2017 CLINICAL DATA:  Low back pain, cough for 2 weeks, no injury EXAM: LUMBAR SPINE - COMPLETE 4+ VIEW COMPARISON:  CT abdomen pelvis of 02/22/2014 FINDINGS: There is anterolisthesis of L4 on L5 by 10 mm which appears to be due to degenerative change of the facet joints of L4-5 and L5-S1. Intervertebral disc spaces appear normal. No compression deformity is seen. This anterolisthesis may have increased somewhat compared to the sagittal images from CT of 2015. The SI joints appear corticated. IMPRESSION: 1. 10 mm anterolisthesis of L4 on L5 which appears to have increased compared to the CT images from 2015,  most likely due to degenerative change of the facet joints. 2. 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 Electronically Signed   By: Ivar Drape M.D.   On: 09/22/2017 09:26    Assessment & Plan:   Annjeanette was seen today for urinary frequency.  Diagnoses and all orders for this visit:  OAB (overactive bladder) -     mirabegron ER (MYRBETRIQ) 50 MG TB24 tablet; Take 1 tablet (50 mg total) by mouth daily.  Essential hypertension- her blood pressure is adequately well controlled.   I am having [name] start on mirabegron ER. I am also having her maintain her traZODone, Cholecalciferol, cyanocobalamin, sertraline, glucose blood, Insulin Pen Needle, repaglinide, pantoprazole, bromocriptine, canagliflozin, atorvastatin, SUMAtriptan, ARIPiprazole, LORazepam, potassium chloride SA, telmisartan, spironolactone, nebivolol, MUCINEX DM, Glycopyrrolate, etodolac, sitaGLIPtin, and tizanidine.  Meds ordered this encounter  Medications  . mirabegron ER (MYRBETRIQ) 50 MG TB24 tablet    Sig: Take 1 tablet (50 mg total) by mouth daily.    Dispense:  90 tablet    Refill:  1     Follow-up: Return in about 3 months (around 01/10/2018).  [name], MD

## 2017-10-10 NOTE — Patient Instructions (Signed)

## 2017-10-18 ENCOUNTER — Telehealth: Payer: Self-pay | Admitting: Internal Medicine

## 2017-10-18 NOTE — Telephone Encounter (Signed)
Copied from [place]. Topic: Quick Communication - See Telephone Encounter >> Oct 18, 2017  4:39 PM Vernona Rieger wrote: CRM for notification. See Telephone encounter for:   10/18/17.  Patient wants to know if she should stop the mirabegron ER (MYRBETRIQ) 50 MG TB24 tablet, because since she started it she has had more issues with her incontinence. Please advise. [phone]

## 2017-10-24 NOTE — Telephone Encounter (Signed)
Pt contacted and informed of same.  

## 2017-10-24 NOTE — Telephone Encounter (Signed)
Pt contacted and she stated that she is having more issues with urinary incontinence and not able to make it to the rest room. Pt would like to DC the Mybetriq. Please advise.

## 2017-10-24 NOTE — Telephone Encounter (Signed)
Yes, stop taking it

## 2017-10-26 ENCOUNTER — Other Ambulatory Visit: Payer: Self-pay | Admitting: Internal Medicine

## 2017-10-26 DIAGNOSIS — Z760 Encounter for issue of repeat prescription: Secondary | ICD-10-CM

## 2017-11-01 ENCOUNTER — Encounter: Payer: Self-pay | Admitting: Internal Medicine

## 2017-11-01 ENCOUNTER — Ambulatory Visit (INDEPENDENT_AMBULATORY_CARE_PROVIDER_SITE_OTHER): Payer: Medicare Other | Admitting: Internal Medicine

## 2017-11-01 VITALS — BP 160/98 | HR 69 | Temp 97.4°F | Resp 16 | Ht 65.0 in | Wt 208.1 lb

## 2017-11-01 DIAGNOSIS — I10 Essential (primary) hypertension: Secondary | ICD-10-CM

## 2017-11-01 DIAGNOSIS — I5189 Other ill-defined heart diseases: Secondary | ICD-10-CM

## 2017-11-01 DIAGNOSIS — I519 Heart disease, unspecified: Secondary | ICD-10-CM | POA: Diagnosis not present

## 2017-11-01 NOTE — Progress Notes (Signed)
Subjective:  Patient ID: [name], female    DOB: [birth year]  Age: 72 y.o. MRN: [id number]  CC: Hypertension   HPI [name] presents for f/up - She has had a few headaches recently and is concerned that her blood pressure is not well controlled.  She confirms compliance with nebivolol and the thiazide diuretic but she does not think she is taking the ARB.  She does not know why she is not taking it but she does inform me that she did not know that it was an antihypertensive.  She stopped taking Abilify a month ago because it was too expensive.  She stopped taking Myrbetriq because it was not helpful.  Outpatient Medications Prior to Visit  Medication Sig Dispense Refill  . atorvastatin (LIPITOR) 20 MG tablet Take 1 tablet (20 mg total) by mouth daily. 90 tablet 1  . bromocriptine (PARLODEL) 2.5 MG tablet 1/4 tab daily 8 tablet 11  . canagliflozin (INVOKANA) 100 MG TABS tablet Take 1 tablet (100 mg total) by mouth daily before breakfast. 90 tablet 1  . Cholecalciferol 2000 units TABS Take 1 tablet (2,000 Units total) by mouth daily. 90 tablet 3  . cyanocobalamin 2000 MCG tablet Take 1 tablet (2,000 mcg total) by mouth daily. 90 tablet 3  . Dextromethorphan-Guaifenesin (MUCINEX DM) 30-600 MG TB12 Take 1 tablet by mouth 2 (two) times daily as needed. 60 tablet 5  . etodolac (LODINE XL) 400 MG 24 hr tablet Take 1 tablet (400 mg total) by mouth daily. 90 tablet 0  . glucose blood test strip Use TID 100 each 11  . Glycopyrrolate (LONHALA MAGNAIR REFILL KIT) 25 MCG/ML SOLN Inhale 1 puff into the lungs 2 (two) times daily. 60 mL 11  . Insulin Pen Needle (NOVOFINE) 32G X 6 MM MISC 1 Act by Does not apply route daily. 100 each 3  . LORazepam (ATIVAN) 1 MG tablet TAKE 1 TABLET BY MOUTH THREE TIMES A DAY. (11/3, 12/3, 1/2, 2/1, 3/2)  4  . nebivolol (BYSTOLIC) 10 MG tablet Take 1 tablet (10 mg total) by mouth daily. 126 tablet 0  . pantoprazole (PROTONIX) 40 MG tablet Take 1  tablet (40 mg total) by mouth daily. 90 tablet 3  . potassium chloride SA (KLOR-CON M20) 20 MEQ tablet Take 1 tablet (20 mEq total) by mouth daily. 90 tablet 3  . repaglinide (PRANDIN) 2 MG tablet Take 1 tablet (2 mg total) by mouth 3 (three) times daily before meals. 90 tablet 11  . sertraline (ZOLOFT) 100 MG tablet Take 200 mg by mouth daily.   12  . sitaGLIPtin (JANUVIA) 100 MG tablet Take 1 tablet (100 mg total) by mouth daily. 90 tablet 1  . spironolactone (ALDACTONE) 25 MG tablet Take 0.5 tablets (12.5 mg total) by mouth daily. 90 tablet 3  . SUMAtriptan (IMITREX) 50 MG tablet TAKE 1 TABLET BY MOUTH AS NEEDED FOR MIGRAINE, CAN REPEAT IN 2 HOURS IF HEADACHE PERSISTS 10 tablet 5  . telmisartan (MICARDIS) 80 MG tablet Take 1 tablet (80 mg total) by mouth daily. 90 tablet 3  . tizanidine (ZANAFLEX) 2 MG capsule TAKE ONE CAPSULE 3 TIMES A DAY 270 capsule 1  . traZODone (DESYREL) 50 MG tablet Reported on 08/07/2015  3  . mirabegron ER (MYRBETRIQ) 50 MG TB24 tablet Take 1 tablet (50 mg total) by mouth daily. 90 tablet 1  . ARIPiprazole (ABILIFY) 2 MG tablet Take 1 tablet (2 mg total) by mouth daily. 30 tablet 3  No facility-administered medications prior to visit.     ROS Review of Systems  Constitutional: Negative.  Negative for appetite change, diaphoresis, fatigue and unexpected weight change.  HENT: Negative.   Eyes: Negative for visual disturbance.  Respiratory: Negative for cough, chest tightness, shortness of breath and wheezing.   Cardiovascular: Negative for chest pain, palpitations and leg swelling.  Gastrointestinal: Negative for abdominal pain, constipation, diarrhea, nausea and vomiting.  Endocrine: Negative.   Genitourinary: Negative.  Negative for difficulty urinating.  Musculoskeletal: Negative for arthralgias, back pain, myalgias and neck pain.  Skin: Negative for color change, pallor and rash.  Neurological: Positive for headaches. Negative for dizziness, weakness,  light-headedness and numbness.  Hematological: Negative for adenopathy. Does not bruise/bleed easily.  Psychiatric/Behavioral: Negative.  Negative for agitation, dysphoric mood, sleep disturbance and suicidal ideas. The patient is not nervous/anxious.     Objective:  BP (!) 160/98 (BP Location: Left Arm, Patient Position: Sitting, Cuff Size: Large)   Pulse 69   Temp (!) 97.4 F (36.3 C) (Oral)   Resp 16   Ht '5\' 5"'$  (1.651 m)   Wt 208 lb 1.9 oz (94.4 kg)   SpO2 95%   BMI 34.63 kg/m   BP Readings from Last 3 Encounters:  11/01/17 (!) 160/98  10/10/17 138/78  10/01/17 126/82    Wt Readings from Last 3 Encounters:  11/01/17 208 lb 1.9 oz (94.4 kg)  10/10/17 215 lb 1.3 oz (97.6 kg)  10/01/17 211 lb (95.7 kg)    Physical Exam  Constitutional: She is oriented to person, place, and time. No distress.  HENT:  Mouth/Throat: Oropharynx is clear and moist. No oropharyngeal exudate.  Eyes: Conjunctivae are normal. Left eye exhibits no discharge. No scleral icterus.  Neck: Normal range of motion. Neck supple. No JVD present. No tracheal deviation present. No thyromegaly present.  Cardiovascular: Normal rate, regular rhythm and normal heart sounds. Exam reveals no gallop and no friction rub.  No murmur heard. Pulmonary/Chest: Effort normal and breath sounds normal. No respiratory distress. She has no wheezes. She has no rales.  Abdominal: Bowel sounds are normal. She exhibits no distension and no mass. There is no tenderness. There is no guarding.  Musculoskeletal: Normal range of motion. She exhibits no edema, tenderness or deformity.  Lymphadenopathy:    She has no cervical adenopathy.  Neurological: She is alert and oriented to person, place, and time.  Skin: Skin is warm and dry. No rash noted. She is not diaphoretic. No erythema. No pallor.  Psychiatric: She has a normal mood and affect. Her behavior is normal. Judgment and thought content normal.  Vitals reviewed.   Lab Results    Component Value Date   WBC 9.7 08/15/2017   HGB 14.8 08/15/2017   HCT 44.0 08/15/2017   PLT 202.0 08/15/2017   GLUCOSE 173 (H) 09/08/2017   CHOL 186 05/31/2017   TRIG 230.0 (H) 05/31/2017   HDL 50.00 05/31/2017   LDLDIRECT 106.0 05/31/2017   LDLCALC 124 (H) 05/11/2016   ALT 11 01/19/2017   AST 11 01/19/2017   NA 137 09/08/2017   K 3.4 (L) 09/08/2017   CL 101 09/08/2017   CREATININE 0.90 09/08/2017   BUN 17 09/08/2017   CO2 27 09/08/2017   TSH 0.82 08/15/2017   INR 1.33 07/25/2014   HGBA1C 6.9 10/01/2017   MICROALBUR 3.3 (H) 05/31/2017    Dg Lumbar Spine Complete  Result Date: 09/22/2017 CLINICAL DATA:  Low back pain, cough for 2 weeks, no injury EXAM: LUMBAR  SPINE - COMPLETE 4+ VIEW COMPARISON:  CT abdomen pelvis of 02/22/2014 FINDINGS: There is anterolisthesis of L4 on L5 by 10 mm which appears to be due to degenerative change of the facet joints of L4-5 and L5-S1. Intervertebral disc spaces appear normal. No compression deformity is seen. This anterolisthesis may have increased somewhat compared to the sagittal images from CT of 2015. The SI joints appear corticated. IMPRESSION: 1. 10 mm anterolisthesis of L4 on L5 which appears to have increased compared to the CT images from 2015, most likely due to degenerative change of the facet joints. 2. 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 Electronically Signed   By: Ivar Drape M.D.   On: 09/22/2017 09:26    Assessment & Plan:   Henny was seen today for hypertension.  Diagnoses and all orders for this visit:  Left ventricular diastolic dysfunction, NYHA class 2- She has a normal volume status.  I will try to maintain better blood pressure control.  Essential hypertension- Her blood pressure is not adequately well controlled.  I have asked her to restart the ARB.  I have asked her also to be compliant with the diuretic and nebivolol.   I have discontinued Karen Chafe. [place]'s ARIPiprazole and mirabegron ER. I  am also having her maintain her traZODone, Cholecalciferol, cyanocobalamin, sertraline, glucose blood, Insulin Pen Needle, repaglinide, bromocriptine, canagliflozin, atorvastatin, SUMAtriptan, LORazepam, potassium chloride SA, telmisartan, spironolactone, nebivolol, MUCINEX DM, Glycopyrrolate, etodolac, sitaGLIPtin, tizanidine, and pantoprazole.  No orders of the defined types were placed in this encounter.    Follow-up: Return in about 6 weeks (around 12/13/2017).  [name], MD

## 2017-11-01 NOTE — Patient Instructions (Signed)

## 2017-11-14 ENCOUNTER — Encounter: Payer: Self-pay | Admitting: Internal Medicine

## 2017-11-14 ENCOUNTER — Ambulatory Visit: Payer: Medicare Other | Admitting: Internal Medicine

## 2017-11-14 VITALS — BP 150/80 | HR 90 | Temp 98.4°F | Resp 16 | Ht 65.0 in | Wt 210.0 lb

## 2017-11-14 DIAGNOSIS — I10 Essential (primary) hypertension: Secondary | ICD-10-CM | POA: Diagnosis not present

## 2017-11-14 NOTE — Progress Notes (Signed)
Subjective:  Patient ID: [name], female    DOB: 23-Jan-1946  Age: 72 y.o. MRN: [id number]  CC: Hypertension   HPI [name] presents for a BP check - She was last seen about 2 weeks ago and during that visit her blood pressure was elevated at 160/98.  She is not sure why her blood sugar pressure would have been that high. She has been compliant with her antihypertensives since I last saw her and tells me her blood pressure has been much better.  She denies headache, blurred vision, chest pain, or shortness of breath.  Outpatient Medications Prior to Visit  Medication Sig Dispense Refill  . atorvastatin (LIPITOR) 20 MG tablet Take 1 tablet (20 mg total) by mouth daily. 90 tablet 1  . bromocriptine (PARLODEL) 2.5 MG tablet 1/4 tab daily 8 tablet 11  . canagliflozin (INVOKANA) 100 MG TABS tablet Take 1 tablet (100 mg total) by mouth daily before breakfast. 90 tablet 1  . Cholecalciferol 2000 units TABS Take 1 tablet (2,000 Units total) by mouth daily. 90 tablet 3  . cyanocobalamin 2000 MCG tablet Take 1 tablet (2,000 mcg total) by mouth daily. 90 tablet 3  . Dextromethorphan-Guaifenesin (MUCINEX DM) 30-600 MG TB12 Take 1 tablet by mouth 2 (two) times daily as needed. 60 tablet 5  . etodolac (LODINE XL) 400 MG 24 hr tablet Take 1 tablet (400 mg total) by mouth daily. 90 tablet 0  . glucose blood test strip Use TID 100 each 11  . Glycopyrrolate (LONHALA MAGNAIR REFILL KIT) 25 MCG/ML SOLN Inhale 1 puff into the lungs 2 (two) times daily. 60 mL 11  . Insulin Pen Needle (NOVOFINE) 32G X 6 MM MISC 1 Act by Does not apply route daily. 100 each 3  . LORazepam (ATIVAN) 1 MG tablet TAKE 1 TABLET BY MOUTH THREE TIMES A DAY. (11/3, 12/3, 1/2, 2/1, 3/2)  4  . nebivolol (BYSTOLIC) 10 MG tablet Take 1 tablet (10 mg total) by mouth daily. 126 tablet 0  . pantoprazole (PROTONIX) 40 MG tablet Take 1 tablet (40 mg total) by mouth daily. 90 tablet 3  . potassium chloride SA (KLOR-CON M20) 20  MEQ tablet Take 1 tablet (20 mEq total) by mouth daily. 90 tablet 3  . repaglinide (PRANDIN) 2 MG tablet Take 1 tablet (2 mg total) by mouth 3 (three) times daily before meals. 90 tablet 11  . sertraline (ZOLOFT) 100 MG tablet Take 200 mg by mouth daily.   12  . sitaGLIPtin (JANUVIA) 100 MG tablet Take 1 tablet (100 mg total) by mouth daily. 90 tablet 1  . spironolactone (ALDACTONE) 25 MG tablet Take 0.5 tablets (12.5 mg total) by mouth daily. 90 tablet 3  . SUMAtriptan (IMITREX) 50 MG tablet TAKE 1 TABLET BY MOUTH AS NEEDED FOR MIGRAINE, CAN REPEAT IN 2 HOURS IF HEADACHE PERSISTS 10 tablet 5  . telmisartan (MICARDIS) 80 MG tablet Take 1 tablet (80 mg total) by mouth daily. 90 tablet 3  . tizanidine (ZANAFLEX) 2 MG capsule TAKE ONE CAPSULE 3 TIMES A DAY 270 capsule 1  . traZODone (DESYREL) 50 MG tablet Reported on 08/07/2015  3   No facility-administered medications prior to visit.     ROS Review of Systems  Constitutional: Negative.  Negative for diaphoresis, fatigue and unexpected weight change.  HENT: Negative.   Eyes: Negative for visual disturbance.  Respiratory: Negative for cough, chest tightness, shortness of breath and wheezing.   Cardiovascular: Negative for chest pain, palpitations and  leg swelling.  Gastrointestinal: Negative for abdominal pain, constipation, diarrhea, nausea and vomiting.  Endocrine: Negative.   Genitourinary: Negative.  Negative for difficulty urinating.  Musculoskeletal: Negative.  Negative for arthralgias, back pain, myalgias and neck pain.  Skin: Negative.  Negative for color change.  Allergic/Immunologic: Negative.   Neurological: Negative.  Negative for dizziness, weakness and light-headedness.  Hematological: Negative for adenopathy. Does not bruise/bleed easily.  Psychiatric/Behavioral: Negative.  Negative for dysphoric mood, sleep disturbance and suicidal ideas. The patient is not nervous/anxious.     Objective:  BP (!) 150/80 (BP Location: Left  Arm, Patient Position: Sitting, Cuff Size: Large)   Pulse 90   Temp 98.4 F (36.9 C) (Oral)   Resp 16   Ht _0  (1.651 m)   Wt 210 lb (95.3 kg)   SpO2 96%   BMI 34.95 kg/m   BP Readings from Last 3 Encounters:  11/14/17 (!) 150/80  11/01/17 (!) 160/98  10/10/17 138/78    Wt Readings from Last 3 Encounters:  11/14/17 210 lb (95.3 kg)  11/01/17 208 lb 1.9 oz (94.4 kg)  10/10/17 215 lb 1.3 oz (97.6 kg)    Physical Exam  Constitutional: She is oriented to person, place, and time. No distress.  HENT:  Mouth/Throat: Oropharynx is clear and moist. No oropharyngeal exudate.  Eyes: Conjunctivae are normal. No scleral icterus.  Neck: Normal range of motion. Neck supple. No JVD present. No thyromegaly present.  Cardiovascular: Normal rate, regular rhythm, normal heart sounds and intact distal pulses. Exam reveals no gallop and no friction rub.  No murmur heard. Pulmonary/Chest: Effort normal and breath sounds normal. No stridor. No respiratory distress. She has no wheezes. She has no rales.  Abdominal: Soft. Bowel sounds are normal. She exhibits no distension and no mass. There is no tenderness. There is no guarding.  Musculoskeletal: Normal range of motion. She exhibits no edema, tenderness or deformity.  Neurological: She is alert and oriented to person, place, and time.  Skin: Skin is warm and dry. She is not diaphoretic.  Vitals reviewed.   Lab Results  Component Value Date   WBC 9.7 08/15/2017   HGB 14.8 08/15/2017   HCT 44.0 08/15/2017   PLT 202.0 08/15/2017   GLUCOSE 173 (H) 09/08/2017   CHOL 186 05/31/2017   TRIG 230.0 (H) 05/31/2017   HDL 50.00 05/31/2017   LDLDIRECT 106.0 05/31/2017   LDLCALC 124 (H) 05/11/2016   ALT 11 01/19/2017   AST 11 01/19/2017   NA 137 09/08/2017   K 3.4 (L) 09/08/2017   CL 101 09/08/2017   CREATININE 0.90 09/08/2017   BUN 17 09/08/2017   CO2 27 09/08/2017   TSH 0.82 08/15/2017   INR 1.33 07/25/2014   HGBA1C 6.9 10/01/2017    MICROALBUR 3.3 (H) 05/31/2017    Dg Lumbar Spine Complete  Result Date: 09/22/2017 CLINICAL DATA:  Low back pain, cough for 2 weeks, no injury EXAM: LUMBAR SPINE - COMPLETE 4+ VIEW COMPARISON:  CT abdomen pelvis of 02/22/2014 FINDINGS: There is anterolisthesis of L4 on L5 by 10 mm which appears to be due to degenerative change of the facet joints of L4-5 and L5-S1. Intervertebral disc spaces appear normal. No compression deformity is seen. This anterolisthesis may have increased somewhat compared to the sagittal images from CT of 2015. The SI joints appear corticated. IMPRESSION: 1. 10 mm anterolisthesis of L4 on L5 which appears to have increased compared to the CT images from 2015, most likely due to degenerative change of the  facet joints. 2. 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 Electronically Signed   By: Ivar Drape M.D.   On: 09/22/2017 09:26    Assessment & Plan:   Genine was seen today for hypertension.  Diagnoses and all orders for this visit:  Essential hypertension- Her blood pressure is adequately well controlled.  She has a history of hypokalemia.  Will continue the combination of an ARB, potassium sparing diuretic, and Bystolic.   I am having [name] maintain her traZODone, Cholecalciferol, cyanocobalamin, sertraline, glucose blood, Insulin Pen Needle, repaglinide, bromocriptine, canagliflozin, atorvastatin, SUMAtriptan, LORazepam, potassium chloride SA, telmisartan, spironolactone, nebivolol, MUCINEX DM, Glycopyrrolate, etodolac, sitaGLIPtin, tizanidine, and pantoprazole.  No orders of the defined types were placed in this encounter.    Follow-up: Return in about 6 months (around 05/16/2018).  [name], MD

## 2017-11-14 NOTE — Patient Instructions (Signed)

## 2017-11-23 ENCOUNTER — Other Ambulatory Visit: Payer: Self-pay | Admitting: Internal Medicine

## 2017-11-23 ENCOUNTER — Encounter: Payer: Self-pay | Admitting: Internal Medicine

## 2017-11-23 DIAGNOSIS — E785 Hyperlipidemia, unspecified: Secondary | ICD-10-CM

## 2017-11-28 ENCOUNTER — Observation Stay (HOSPITAL_COMMUNITY)
Admission: EM | Admit: 2017-11-28 | Discharge: 2017-11-30 | Disposition: A | Discharge status: Home / Self Care | Payer: Medicare Other | Attending: Family Medicine | Admitting: Family Medicine

## 2017-11-28 ENCOUNTER — Other Ambulatory Visit: Payer: Self-pay

## 2017-11-28 ENCOUNTER — Telehealth: Payer: Self-pay | Admitting: *Deleted

## 2017-11-28 ENCOUNTER — Encounter (HOSPITAL_COMMUNITY): Payer: Self-pay

## 2017-11-28 ENCOUNTER — Emergency Department (HOSPITAL_COMMUNITY): Payer: Medicare Other

## 2017-11-28 DIAGNOSIS — E876 Hypokalemia: Secondary | ICD-10-CM | POA: Diagnosis not present

## 2017-11-28 DIAGNOSIS — N390 Urinary tract infection, site not specified: Secondary | ICD-10-CM | POA: Insufficient documentation

## 2017-11-28 DIAGNOSIS — E861 Hypovolemia: Secondary | ICD-10-CM | POA: Insufficient documentation

## 2017-11-28 DIAGNOSIS — I5189 Other ill-defined heart diseases: Secondary | ICD-10-CM

## 2017-11-28 DIAGNOSIS — R197 Diarrhea, unspecified: Secondary | ICD-10-CM | POA: Insufficient documentation

## 2017-11-28 DIAGNOSIS — R6510 Systemic inflammatory response syndrome (SIRS) of non-infectious origin without acute organ dysfunction: Secondary | ICD-10-CM

## 2017-11-28 DIAGNOSIS — Z79899 Other long term (current) drug therapy: Secondary | ICD-10-CM | POA: Insufficient documentation

## 2017-11-28 DIAGNOSIS — E559 Vitamin D deficiency, unspecified: Secondary | ICD-10-CM | POA: Diagnosis not present

## 2017-11-28 DIAGNOSIS — E538 Deficiency of other specified B group vitamins: Secondary | ICD-10-CM | POA: Diagnosis not present

## 2017-11-28 DIAGNOSIS — R51 Headache: Secondary | ICD-10-CM | POA: Insufficient documentation

## 2017-11-28 DIAGNOSIS — Z8249 Family history of ischemic heart disease and other diseases of the circulatory system: Secondary | ICD-10-CM | POA: Insufficient documentation

## 2017-11-28 DIAGNOSIS — E118 Type 2 diabetes mellitus with unspecified complications: Secondary | ICD-10-CM

## 2017-11-28 DIAGNOSIS — Z794 Long term (current) use of insulin: Secondary | ICD-10-CM | POA: Diagnosis not present

## 2017-11-28 DIAGNOSIS — E1165 Type 2 diabetes mellitus with hyperglycemia: Secondary | ICD-10-CM | POA: Diagnosis not present

## 2017-11-28 DIAGNOSIS — E669 Obesity, unspecified: Secondary | ICD-10-CM | POA: Diagnosis not present

## 2017-11-28 DIAGNOSIS — F411 Generalized anxiety disorder: Secondary | ICD-10-CM | POA: Diagnosis not present

## 2017-11-28 DIAGNOSIS — I4581 Long QT syndrome: Secondary | ICD-10-CM | POA: Diagnosis not present

## 2017-11-28 DIAGNOSIS — I10 Essential (primary) hypertension: Secondary | ICD-10-CM | POA: Diagnosis not present

## 2017-11-28 DIAGNOSIS — G43001 Migraine without aura, not intractable, with status migrainosus: Secondary | ICD-10-CM

## 2017-11-28 DIAGNOSIS — Z881 Allergy status to other antibiotic agents status: Secondary | ICD-10-CM | POA: Insufficient documentation

## 2017-11-28 DIAGNOSIS — R001 Bradycardia, unspecified: Secondary | ICD-10-CM | POA: Diagnosis not present

## 2017-11-28 DIAGNOSIS — Z6835 Body mass index (BMI) 35.0-35.9, adult: Secondary | ICD-10-CM | POA: Insufficient documentation

## 2017-11-28 DIAGNOSIS — K219 Gastro-esophageal reflux disease without esophagitis: Secondary | ICD-10-CM | POA: Insufficient documentation

## 2017-11-28 DIAGNOSIS — I959 Hypotension, unspecified: Secondary | ICD-10-CM | POA: Insufficient documentation

## 2017-11-28 DIAGNOSIS — I341 Nonrheumatic mitral (valve) prolapse: Secondary | ICD-10-CM | POA: Insufficient documentation

## 2017-11-28 DIAGNOSIS — Z888 Allergy status to other drugs, medicaments and biological substances status: Secondary | ICD-10-CM | POA: Insufficient documentation

## 2017-11-28 DIAGNOSIS — Z880 Allergy status to penicillin: Secondary | ICD-10-CM | POA: Insufficient documentation

## 2017-11-28 DIAGNOSIS — R918 Other nonspecific abnormal finding of lung field: Secondary | ICD-10-CM | POA: Insufficient documentation

## 2017-11-28 DIAGNOSIS — A4151 Sepsis due to Escherichia coli [E. coli]: Secondary | ICD-10-CM | POA: Diagnosis not present

## 2017-11-28 DIAGNOSIS — N3000 Acute cystitis without hematuria: Secondary | ICD-10-CM

## 2017-11-28 DIAGNOSIS — I519 Heart disease, unspecified: Secondary | ICD-10-CM

## 2017-11-28 DIAGNOSIS — F329 Major depressive disorder, single episode, unspecified: Secondary | ICD-10-CM | POA: Insufficient documentation

## 2017-11-28 DIAGNOSIS — Z982 Presence of cerebrospinal fluid drainage device: Secondary | ICD-10-CM | POA: Insufficient documentation

## 2017-11-28 LAB — CBC WITH DIFFERENTIAL/PLATELET
Basophils Absolute: 0 10*3/uL (ref 0.0–0.1)
Basophils Relative: 1 %
Eosinophils Absolute: 0.2 10*3/uL (ref 0.0–0.7)
Eosinophils Relative: 4 %
HCT: 38.2 % (ref 36.0–46.0)
Hemoglobin: 13.1 g/dL (ref 12.0–15.0)
Lymphocytes Relative: 30 %
Lymphs Abs: 1.5 10*3/uL (ref 0.7–4.0)
MCH: 32.2 pg (ref 26.0–34.0)
MCHC: 34.3 g/dL (ref 30.0–36.0)
MCV: 93.9 fL (ref 78.0–100.0)
Monocytes Absolute: 0.3 10*3/uL (ref 0.1–1.0)
Monocytes Relative: 7 %
Neutro Abs: 2.9 10*3/uL (ref 1.7–7.7)
Neutrophils Relative %: 58 %
Platelets: 221 10*3/uL (ref 150–400)
RBC: 4.07 MIL/uL (ref 3.87–5.11)
RDW: 13.1 % (ref 11.5–15.5)
WBC: 4.9 10*3/uL (ref 4.0–10.5)

## 2017-11-28 LAB — URINALYSIS, ROUTINE W REFLEX MICROSCOPIC
Bilirubin Urine: NEGATIVE
Glucose, UA: NEGATIVE mg/dL
Hgb urine dipstick: NEGATIVE
Ketones, ur: NEGATIVE mg/dL
Nitrite: POSITIVE — AB
Protein, ur: NEGATIVE mg/dL
Specific Gravity, Urine: 1.018 (ref 1.005–1.030)
WBC, UA: >50 WBC/hpf — ABNORMAL HIGH (ref 0–5)
pH: 5 (ref 5.0–8.0)

## 2017-11-28 LAB — COMPREHENSIVE METABOLIC PANEL
ALT: 12 U/L — ABNORMAL LOW (ref 14–54)
AST: 17 U/L (ref 15–41)
Albumin: 3.5 g/dL (ref 3.5–5.0)
Alkaline Phosphatase: 58 U/L (ref 38–126)
Anion gap: 9 (ref 5–15)
BUN: 13 mg/dL (ref 6–20)
CO2: 21 mmol/L — ABNORMAL LOW (ref 22–32)
Calcium: 9 mg/dL (ref 8.9–10.3)
Chloride: 107 mmol/L (ref 101–111)
Creatinine, Ser: 1.02 mg/dL — ABNORMAL HIGH (ref 0.44–1.00)
GFR calc Af Amer: >60 mL/min (ref >60)
GFR calc non Af Amer: 54 mL/min — ABNORMAL LOW (ref >60)
Glucose, Bld: 194 mg/dL — ABNORMAL HIGH (ref 65–99)
Potassium: 3.1 mmol/L — ABNORMAL LOW (ref 3.5–5.1)
Sodium: 137 mmol/L (ref 135–145)
Total Bilirubin: 0.9 mg/dL (ref 0.3–1.2)
Total Protein: 6.6 g/dL (ref 6.5–8.1)

## 2017-11-28 LAB — I-STAT CG4 LACTIC ACID, ED: Lactic Acid, Venous: 1.9 mmol/L (ref 0.5–1.9)

## 2017-11-28 LAB — I-STAT TROPONIN, ED: Troponin i, poc: 0 ng/mL (ref 0.00–0.08)

## 2017-11-28 LAB — I-STAT CHEM 8, ED
BUN: 16 mg/dL (ref 6–20)
Calcium, Ion: 1.16 mmol/L (ref 1.15–1.40)
Chloride: 106 mmol/L (ref 101–111)
Creatinine, Ser: 0.9 mg/dL (ref 0.44–1.00)
Glucose, Bld: 190 mg/dL — ABNORMAL HIGH (ref 65–99)
HCT: 38 % (ref 36.0–46.0)
Hemoglobin: 12.9 g/dL (ref 12.0–15.0)
Potassium: 3.6 mmol/L (ref 3.5–5.1)
Sodium: 139 mmol/L (ref 135–145)
TCO2: 24 mmol/L (ref 22–32)

## 2017-11-28 LAB — GLUCOSE, CAPILLARY
Glucose-Capillary: 132 mg/dL — ABNORMAL HIGH (ref 65–99)
Glucose-Capillary: 149 mg/dL — ABNORMAL HIGH (ref 65–99)

## 2017-11-28 MED ORDER — AZTREONAM IN DEXTROSE 2 GM/50ML IV SOLN
2.0000 g | Freq: Once | INTRAVENOUS | Status: AC
  Administered 2017-11-28: 2 g via INTRAVENOUS
  Filled 2017-11-28: qty 50

## 2017-11-28 MED ORDER — ENOXAPARIN SODIUM 40 MG/0.4ML ~~LOC~~ SOLN
40.0000 mg | SUBCUTANEOUS | Status: DC
  Administered 2017-11-28 – 2017-11-30 (×3): 40 mg via SUBCUTANEOUS
  Filled 2017-11-28 (×3): qty 0.4

## 2017-11-28 MED ORDER — SODIUM CHLORIDE 0.9 % IV SOLN
1.0000 g | INTRAVENOUS | Status: DC
  Administered 2017-11-28 – 2017-11-29 (×2): 1 g via INTRAVENOUS
  Filled 2017-11-28 (×2): qty 1

## 2017-11-28 MED ORDER — POLYVINYL ALCOHOL 1.4 % OP SOLN
1.0000 [drp] | OPHTHALMIC | Status: DC | PRN
  Filled 2017-11-28: qty 15

## 2017-11-28 MED ORDER — CANAGLIFLOZIN 100 MG PO TABS
100.0000 mg | ORAL_TABLET | Freq: Every day | ORAL | Status: DC
  Administered 2017-11-29: 100 mg via ORAL
  Filled 2017-11-28: qty 1

## 2017-11-28 MED ORDER — SODIUM CHLORIDE 0.9 % IV BOLUS (SEPSIS)
1000.0000 mL | Freq: Once | INTRAVENOUS | Status: AC
  Administered 2017-11-28: 1000 mL via INTRAVENOUS

## 2017-11-28 MED ORDER — INSULIN ASPART 100 UNIT/ML ~~LOC~~ SOLN: 0.0000 [IU] | Freq: Every day | SUBCUTANEOUS | Status: DC

## 2017-11-28 MED ORDER — FAMOTIDINE 20 MG PO TABS
20.0000 mg | ORAL_TABLET | Freq: Every day | ORAL | Status: DC
  Administered 2017-11-28 – 2017-11-30 (×3): 20 mg via ORAL
  Filled 2017-11-28 (×3): qty 1

## 2017-11-28 MED ORDER — ETODOLAC 200 MG PO CAPS
400.0000 mg | ORAL_CAPSULE | Freq: Every day | ORAL | Status: DC
  Administered 2017-11-28 – 2017-11-30 (×3): 400 mg via ORAL
  Filled 2017-11-28 (×3): qty 2

## 2017-11-28 MED ORDER — GLUCAGON HCL RDNA (DIAGNOSTIC) 1 MG IJ SOLR
1.0000 mg | Freq: Once | INTRAMUSCULAR | Status: AC
  Administered 2017-11-28: 1 mg via INTRAVENOUS
  Filled 2017-11-28: qty 1

## 2017-11-28 MED ORDER — ACETAMINOPHEN 325 MG PO TABS
650.0000 mg | ORAL_TABLET | Freq: Four times a day (QID) | ORAL | Status: DC | PRN
  Administered 2017-11-28 – 2017-11-30 (×3): 650 mg via ORAL
  Filled 2017-11-28 (×3): qty 2

## 2017-11-28 MED ORDER — LORAZEPAM 0.5 MG PO TABS
0.5000 mg | ORAL_TABLET | Freq: Three times a day (TID) | ORAL | Status: DC | PRN
  Administered 2017-11-28 – 2017-11-29 (×3): 0.5 mg via ORAL
  Filled 2017-11-28 (×3): qty 1

## 2017-11-28 MED ORDER — SERTRALINE HCL 100 MG PO TABS
200.0000 mg | ORAL_TABLET | Freq: Every day | ORAL | Status: DC
  Administered 2017-11-28 – 2017-11-30 (×3): 200 mg via ORAL
  Filled 2017-11-28 (×3): qty 2

## 2017-11-28 MED ORDER — ONDANSETRON HCL 4 MG/2ML IJ SOLN
4.0000 mg | Freq: Four times a day (QID) | INTRAMUSCULAR | Status: DC | PRN
  Administered 2017-11-30: 4 mg via INTRAVENOUS
  Filled 2017-11-28: qty 2

## 2017-11-28 MED ORDER — VITAMIN B-12 1000 MCG PO TABS
2000.0000 ug | ORAL_TABLET | Freq: Every day | ORAL | Status: DC
Start: 2017-11-28 — End: 2017-11-30
  Administered 2017-11-28 – 2017-11-30 (×3): 2000 ug via ORAL
  Filled 2017-11-28 (×3): qty 2

## 2017-11-28 MED ORDER — SODIUM CHLORIDE 0.9 % IV SOLN
2.0000 g | Freq: Once | INTRAVENOUS | Status: DC
  Filled 2017-11-28: qty 2

## 2017-11-28 MED ORDER — ACETAMINOPHEN 650 MG RE SUPP: 650.0000 mg | Freq: Four times a day (QID) | RECTAL | Status: DC | PRN

## 2017-11-28 MED ORDER — SODIUM CHLORIDE 0.9 % IV BOLUS
1000.0000 mL | Freq: Once | INTRAVENOUS | Status: AC
  Administered 2017-11-28: 1000 mL via INTRAVENOUS

## 2017-11-28 MED ORDER — TIZANIDINE HCL 2 MG PO TABS
2.0000 mg | ORAL_TABLET | Freq: Three times a day (TID) | ORAL | Status: DC
  Administered 2017-11-28 – 2017-11-30 (×7): 2 mg via ORAL
  Filled 2017-11-28 (×8): qty 1

## 2017-11-28 MED ORDER — ATORVASTATIN CALCIUM 20 MG PO TABS
20.0000 mg | ORAL_TABLET | Freq: Every day | ORAL | Status: DC
  Administered 2017-11-28 – 2017-11-30 (×3): 20 mg via ORAL
  Filled 2017-11-28 (×3): qty 1

## 2017-11-28 MED ORDER — ONDANSETRON HCL 4 MG PO TABS
4.0000 mg | ORAL_TABLET | Freq: Four times a day (QID) | ORAL | Status: DC | PRN
  Administered 2017-11-28 – 2017-11-29 (×3): 4 mg via ORAL
  Filled 2017-11-28 (×3): qty 1

## 2017-11-28 MED ORDER — SODIUM CHLORIDE 0.9 % IV SOLN
INTRAVENOUS | Status: DC
  Administered 2017-11-28 – 2017-11-29 (×2): via INTRAVENOUS

## 2017-11-28 MED ORDER — INSULIN ASPART 100 UNIT/ML ~~LOC~~ SOLN
0.0000 [IU] | Freq: Three times a day (TID) | SUBCUTANEOUS | Status: DC
  Administered 2017-11-28: 1 [IU] via SUBCUTANEOUS
  Administered 2017-11-29: 2 [IU] via SUBCUTANEOUS
  Administered 2017-11-29 – 2017-11-30 (×2): 1 [IU] via SUBCUTANEOUS
  Administered 2017-11-30: 2 [IU] via SUBCUTANEOUS

## 2017-11-28 MED ORDER — LIVING WELL WITH DIABETES BOOK
Freq: Once | Status: AC
  Administered 2017-11-28: 15:00:00
  Filled 2017-11-28: qty 1

## 2017-11-28 MED ORDER — TRAZODONE HCL 100 MG PO TABS
100.0000 mg | ORAL_TABLET | Freq: Every evening | ORAL | Status: DC | PRN
  Administered 2017-11-28: 100 mg via ORAL
  Filled 2017-11-28: qty 1

## 2017-11-28 MED ORDER — LEVOFLOXACIN IN D5W 750 MG/150ML IV SOLN
750.0000 mg | Freq: Once | INTRAVENOUS | Status: AC
  Administered 2017-11-28: 750 mg via INTRAVENOUS
  Filled 2017-11-28: qty 150

## 2017-11-28 NOTE — Progress Notes (Signed)
Inpatient Diabetes Program Recommendations  AACE/ADA: New Consensus Statement on Inpatient Glycemic Control (2015)  Target Ranges:  Prepandial:   less than 140 mg/dL      Peak postprandial:   less than 180 mg/dL (1-2 hours)      Critically ill patients:  140 - 180 mg/dL   Lab Results  Component Value Date   GLUCAP 178 (H) 12/27/2014   HGBA1C 6.9 10/01/2017    Review of Glycemic Control  Diabetes history: DM2 Outpatient Diabetes medications: Prandin 2 mg tid Current orders for Inpatient glycemic control: Invokana qd  Inpatient Diabetes Program Recommendations: Glycemic control order set with sensitive correction tid + hs 0-5 units   Spoke with patient @ bedside. Patient shared she has lost 52 lbs since diagnosed with DM 2 last August. Patient sees Dr. Loanne Drilling as her endocrinologist and has met with CDE Leonia Reader @ Dr. Cordelia Pen office regarding diabetes education. Patient has multiple questions regarding diabetes and discussed with patient. Ordered Living Well with diabetes book for patient. Shared information with pharmacist regarding home medications. Patient states she has support from her church for food support but ashamed to let her church know her need. Patient shared she likes to spend $100 per month on books and a friend is sharing books with her to help her. Case management consulted and patient was given list of churches with food banks while in the ER. Patient received groceries from a church recently and items were random and unhealthy.  Thank you, Nani Gasser. Jamonica Schoff, RN, MSN, CDE  Diabetes Coordinator Inpatient Glycemic Control Team Team Pager [phone] (8am-5pm) 11/28/2017 12:09 PM

## 2017-11-28 NOTE — H&P (Signed)
[facility] History and Physical   Patient: [name] JQB:[id number]   PCP: Janith Lima, MD DOB: 10-30-1945   DOA: 11/28/2017   DOS: 11/28/2017   DOS: the patient was seen and examined on 11/28/2017  Patient coming from: The patient is coming from home.  Chief Complaint: Nausea vomiting diarrhea  HPI: [name] is a 72 y.o. female with Past medical history of type 2 diabetes mellitus, depression, anxiety, HTN, chronic sinus bradycardia. Presents with generalized weakness and nausea vomiting diarrhea.  Patient reportedly had nausea vomiting with multiple episodes ongoing since last few days.  Patient also had 5-10 bowel movements yesterday and day before as well. Denies having any blood in the stool. No abdominal pain reported at the time of my evaluation. She did not have fever but had chills.  She also had dizziness and lightheadedness as well as seen black spots when changing her position. She checked her blood pressure at home and it was 60 systolic and therefore decided to come to the hospital with 911. At the time of my evaluation she feels better denies having any dizziness no chest pain abdominal pain.  Nausea has resolved.  Diarrhea has resolved.  No burning urination reported by the patient as well.  ED Course: Presented with above symptoms, concern for sepsis.  Started on IV antibiotics.  Admission was recommended  At her baseline ambulates without support And is independent for most of her ADL; manages her medication on her own.  Review of Systems: as mentioned in the history of present illness.  All other systems reviewed and are negative.  Past Medical History:  Diagnosis Date  . Acid reflux disease   . Acute kidney injury ([place])   . Anxiety   . Depression   . Diabetes mellitus without complication ([place])   . Dizziness   . Falls   . Hydrocephalus   . Hypertension   . Hypotension   . MVP (mitral valve prolapse)   . Sinus bradycardia   .  Transaminitis    Past Surgical History:  Procedure Laterality Date  . BRAIN SURGERY    . BREAST BIOPSY Left   . CHOLECYSTECTOMY    . ERCP N/A 02/28/2014   Procedure: ENDOSCOPIC RETROGRADE CHOLANGIOPANCREATOGRAPHY (ERCP);  Surgeon: Gatha Mayer, MD;  Location: [facility] ENDOSCOPY;  Service: Endoscopy;  Laterality: N/A;  talked to tiffany from or /ebp  . ESOPHAGOGASTRODUODENOSCOPY N/A 02/28/2014   Procedure: ESOPHAGOGASTRODUODENOSCOPY (EGD);  Surgeon: Gatha Mayer, MD;  Location: [facility] ENDOSCOPY;  Service: Endoscopy;  Laterality: N/A;  . VENTRICULOPERITONEAL SHUNT     right occipital   Social History:  reports that she has never smoked. She has never used smokeless tobacco. She reports that she does not drink alcohol or use drugs.  Allergies  Allergen Reactions  . Metformin And Related Diarrhea    (takes metformin at home w/ meals)  . Ace Inhibitors Other (See Comments)    unknown  . Erythromycin Hives  . Penicillins Hives    Has patient had a PCN reaction causing immediate rash, facial/tongue/throat swelling, SOB or lightheadedness with hypotension: Yes Has patient had a PCN reaction causing severe rash involving mucus membranes or skin necrosis: No Has patient had a PCN reaction that required hospitalization: No Has patient had a PCN reaction occurring within the last 10 years: No If all of the above answers are "NO", then may proceed with Cephalosporin use.   . Topamax [Topiramate] Rash    Family History  Problem Relation  Age of Onset  . Heart disease Mother   . Stroke Mother   . Hypertension Mother   . Alcohol abuse Father   . Diabetes Father   . Cancer Neg Hx      Prior to Admission medications   Medication Sig Start Date End Date Taking? Authorizing Provider  atorvastatin (LIPITOR) 20 MG tablet TAKE 1 TABLET BY MOUTH EVERY DAY 11/23/17  Yes Janith Lima, MD  canagliflozin [facility]) 100 MG TABS tablet Take 1 tablet (100 mg total) by mouth daily before breakfast. 06/01/17   Yes Janith Lima, MD  cyanocobalamin 2000 MCG tablet Take 1 tablet (2,000 mcg total) by mouth daily. 08/09/15  Yes Janith Lima, MD  Dextromethorphan-Guaifenesin [facility] DM) 30-600 MG TB12 Take 1 tablet by mouth 2 (two) times daily as needed. Patient taking differently: Take 1 tablet by mouth 2 (two) times daily as needed (congestion).  09/08/17  Yes Janith Lima, MD  etodolac (LODINE XL) 400 MG 24 hr tablet Take 1 tablet (400 mg total) by mouth daily. 09/27/17  Yes Janith Lima, MD  glucose blood test strip Use TID 10/13/16  Yes Janith Lima, MD  Glycopyrrolate [facility] REFILL KIT) 25 MCG/ML SOLN Inhale 1 puff into the lungs 2 (two) times daily. 09/22/17  Yes Janith Lima, MD  Insulin Pen Needle (NOVOFINE) 32G X 6 MM MISC 1 Act by Does not apply route daily. 10/13/16  Yes Janith Lima, MD  LORazepam (ATIVAN) 1 MG tablet Take 1 mg by mouth three times daily as needed for anxiety 08/03/17  Yes [provider]  nebivolol (BYSTOLIC) 10 MG tablet Take 1 tablet (10 mg total) by mouth daily. 08/22/17  Yes Janith Lima, MD  pantoprazole (PROTONIX) 40 MG tablet Take 1 tablet (40 mg total) by mouth daily. Patient taking differently: Take 40 mg by mouth daily as needed (reflux).  10/26/17  Yes Janith Lima, MD  repaglinide (PRANDIN) 2 MG tablet Take 1 tablet (2 mg total) by mouth 3 (three) times daily before meals. 11/15/16  Yes Renato Shin, MD  sertraline (ZOLOFT) 100 MG tablet Take 200 mg by mouth daily.  01/13/16  Yes [provider]  sitaGLIPtin (JANUVIA) 100 MG tablet Take 1 tablet (100 mg total) by mouth daily. 10/01/17  Yes Janith Lima, MD  SUMAtriptan (IMITREX) 50 MG tablet TAKE 1 TABLET BY MOUTH AS NEEDED FOR MIGRAINE, CAN REPEAT IN 2 HOURS IF HEADACHE PERSISTS 07/12/17  Yes Janith Lima, MD  telmisartan (MICARDIS) 80 MG tablet Take 1 tablet (80 mg total) by mouth daily. 08/16/17  Yes Bhagat, Bhavinkumar, PA  tizanidine (ZANAFLEX) 2 MG capsule TAKE ONE  CAPSULE 3 TIMES A DAY 10/09/17  Yes Janith Lima, MD  traZODone (DESYREL) 50 MG tablet Take 100 mg by mouth at bedtime 07/24/15  Yes [provider]  potassium chloride SA (KLOR-CON M20) 20 MEQ tablet Take 1 tablet (20 mEq total) by mouth daily. 08/16/17 11/14/17  Leanor Kail, PA  spironolactone (ALDACTONE) 25 MG tablet Take 0.5 tablets (12.5 mg total) by mouth daily. Patient not taking: Reported on 11/28/2017 08/16/17 11/14/17  Leanor Kail, PA    Physical Exam: Vitals:   11/28/17 0730 11/28/17 0735 11/28/17 0849 11/28/17 1614  BP: 135/66  (!) 149/70 (!) 131/58  Pulse: 66  70 70  Resp: 16  (!) 22 20  Temp:  (!) 97.5 F (36.4 C) 97.9 F (36.6 C) 98.8 F (37.1 C)  TempSrc:  Oral  Oral Oral  SpO2: 97%  100%   Weight:      Height:        General: Alert, Awake and Oriented to Time, Place and Person. Appear in mild distress, affect appropriate Eyes: PERRL, Conjunctiva normal ENT: Oral Mucosa clear moist. Neck: no JVD, no Abnormal Mass Or lumps Cardiovascular: S1 and S2 Present, no Murmur, Peripheral Pulses Present Respiratory: normal respiratory effort, Bilateral Air entry equal and Decreased, no use of accessory muscle, Clear to Auscultation, no Crackles, no wheezes Abdomen: Bowel Sound present, Soft and no tenderness, no hernia Skin: no redness, no Rash, no induration Extremities: no Pedal edema, no calf tenderness Neurologic: Grossly no focal neuro deficit. Bilaterally Equal motor strength  Labs on Admission:  CBC: Recent Labs  Lab 11/28/17 0317 11/28/17 0334  WBC 4.9  --   NEUTROABS 2.9  --   HGB 13.1 12.9  HCT 38.2 38.0  MCV 93.9  --   PLT 221  --    Basic Metabolic Panel: Recent Labs  Lab 11/28/17 0317 11/28/17 0334  NA 137 139  K 3.1* 3.6  CL 107 106  CO2 21*  --   GLUCOSE 194* 190*  BUN 13 16  CREATININE 1.02* 0.90  CALCIUM 9.0  --    GFR: Estimated Creatinine Clearance: 66.4 mL/min (by C-G formula based on SCr of 0.9 mg/dL). Liver  Function Tests: Recent Labs  Lab 11/28/17 0317  AST 17  ALT 12*  ALKPHOS 58  BILITOT 0.9  PROT 6.6  ALBUMIN 3.5   No results for input(s): LIPASE, AMYLASE in the last 168 hours. No results for input(s): AMMONIA in the last 168 hours. Coagulation Profile: No results for input(s): INR, PROTIME in the last 168 hours. Cardiac Enzymes: No results for input(s): CKTOTAL, CKMB, CKMBINDEX, TROPONINI in the last 168 hours. BNP (last 3 results) No results for input(s): PROBNP in the last 8760 hours. HbA1C: No results for input(s): HGBA1C in the last 72 hours. CBG: Recent Labs  Lab 11/28/17 1647  GLUCAP 132*   Lipid Profile: No results for input(s): CHOL, HDL, LDLCALC, TRIG, CHOLHDL, LDLDIRECT in the last 72 hours. Thyroid Function Tests: No results for input(s): TSH, T4TOTAL, FREET4, T3FREE, THYROIDAB in the last 72 hours. Anemia Panel: No results for input(s): VITAMINB12, FOLATE, FERRITIN, TIBC, IRON, RETICCTPCT in the last 72 hours. Urine analysis:    Component Value Date/Time   COLORURINE YELLOW 11/28/2017 0317   APPEARANCEUR HAZY (A) 11/28/2017 0317   LABSPEC 1.018 11/28/2017 0317   PHURINE 5.0 11/28/2017 0317   GLUCOSEU NEGATIVE 11/28/2017 0317   GLUCOSEU >=1000 (A) 05/31/2017 1155   HGBUR NEGATIVE 11/28/2017 0317   BILIRUBINUR NEGATIVE 11/28/2017 0317   KETONESUR NEGATIVE 11/28/2017 0317   PROTEINUR NEGATIVE 11/28/2017 0317   UROBILINOGEN 0.2 05/31/2017 1155   NITRITE POSITIVE (A) 11/28/2017 0317   LEUKOCYTESUR LARGE (A) 11/28/2017 0317    Radiological Exams on Admission: Dg Chest Portable 1 View  Result Date: 11/28/2017 CLINICAL DATA:  Hypotension EXAM: PORTABLE CHEST 1 VIEW COMPARISON:  08/15/2017 FINDINGS: Emphysematous hyperinflation of lungs. Top-normal size heart with minimal aortic atherosclerosis. No aneurysm. No acute pulmonary consolidation, effusion or pneumothorax. No suspicious osseous abnormality. IMPRESSION: Hyperinflated lungs without active pulmonary  disease. Electronically Signed   By: Ashley Royalty M.D.   On: 11/28/2017 03:57   EKG: Independently reviewed. normal sinus rhythm, nonspecific ST and T waves changes, sinus bradycardia, prolonged QT interval.  Assessment/Plan 1.  SIRS. ?  Sepsis with UTI. Nausea vomiting and diarrhea. Currently  symptoms are resolved. Blood pressure is better. Urine has evidence of possible UTI, will follow urine culture as well as blood cultures. Currently on IV ceftriaxone for empiric coverage. Low threshold to discontinue antibiotics. Suspect that patient was severely dehydrated due to nausea vomiting and diarrhea and she was continuously taking her beta-blocker and therefore presented to the hospital with symptoms of dizziness and lightheadedness and low blood pressure.  2.  Essential hypertension. Hypotension. Sinus bradycardia. Prolonged QT. Monitor on telemetry. Hold Bystolic. Also hold Aldactone telmisartan. QT interval presentation is likely secondary to hypokalemia from GI losses. Will replace and monitor on telemetry.  3.  Anxiety and depression. Continuing home regimen of Zoloft, Ativan at reduced dose, trazodone at reduced dose  4.  Type 2 diabetes mellitus. Continue carb modified diet. Continue sliding scale insulin. Continue Invokana, hold other hypoglycemic agents.  Nutrition: Cardiac and carb modified diet DVT Prophylaxis: subcutaneous Heparin  Advance goals of care discussion: full code   Consults: none  Family Communication: no family was present at bedside, at the time of interview.  Disposition: Admitted as observation, telemetry unit. Likely to be discharged home, in 1 days.  Author: Berle Mull, MD [facility] 11/28/2017  If 7PM-7AM, please contact night-coverage [email or web address] Password [email or web address]

## 2017-11-28 NOTE — ED Notes (Signed)
ED TO INPATIENT HANDOFF REPORT  Name/Age/Gender [name] 72 y.o. female  Code Status Code Status History    Date Active Date Inactive Code Status Order ID Comments User Context   07/23/2014 1815 07/28/2014 1804 Full Code [id number]  Donne Hazel, MD ED   04/03/2014 0102 04/04/2014 2054 Full Code [id number]  Rise Patience, MD Inpatient   03/22/2014 2153 03/25/2014 1550 Full Code [id number]  Theressa Millard, MD Inpatient   02/22/2014 2008 03/01/2014 1454 Full Code [id number]  Hosie Poisson, MD Inpatient   02/13/2014 0708 02/15/2014 1715 Full Code [id number]  Berle Mull, MD ED      Home/SNF/Other Home  Chief Complaint hypotension  Level of Care/Admitting Diagnosis ED Disposition    ED Disposition Condition [place] [facility] Area: [facility] [[id number]]  Level of Care: Telemetry [5]  Admit to tele based on following criteria: Monitor for Ischemic changes  Diagnosis: Hypotension [540086]  Admitting Physician: Rise Patience [phone]  Attending Physician: Rise Patience [phone]  PT Class (Do Not Modify): Observation [104]  PT Acc Code (Do Not Modify): Observation [10022]       Medical History Past Medical History:  Diagnosis Date  . Acid reflux disease   . Acute kidney injury ([place])   . Anxiety   . Depression   . Diabetes mellitus without complication ([place])   . Dizziness   . Falls   . Hydrocephalus   . Hypertension   . Hypotension   . MVP (mitral valve prolapse)   . Sinus bradycardia   . Transaminitis     Allergies Allergies  Allergen Reactions  . Metformin And Related Diarrhea    (takes metformin at home w/ meals)  . Ace Inhibitors Other (See Comments)    unknown  . Erythromycin Hives  . Penicillins Hives    Has patient had a PCN reaction causing immediate rash, facial/tongue/throat swelling, SOB or lightheadedness with hypotension: Yes Has patient had a PCN reaction causing severe rash involving mucus  membranes or skin necrosis: No Has patient had a PCN reaction that required hospitalization: No Has patient had a PCN reaction occurring within the last 10 years: No If all of the above answers are "NO", then may proceed with Cephalosporin use.   . Topamax [Topiramate] Rash    IV Location/Drains/Wounds Patient Lines/Drains/Airways Status   Active Line/Drains/Airways    Name:   Placement date:   Placement time:   Site:   Days:   Peripheral IV 11/28/17 Right Forearm   11/28/17    0348    Forearm   less than 1   Peripheral IV 11/28/17 Left Hand   11/28/17    0459    Hand   less than 1          Labs/Imaging Results for orders placed or performed during the hospital encounter of 11/28/17 (from the past 48 hour(s))  CBC with Differential/Platelet     Status: None   Collection Time: 11/28/17  3:17 AM  Result Value Ref Range   WBC 4.9 4.0 - 10.5 K/uL   RBC 4.07 3.87 - 5.11 MIL/uL   Hemoglobin 13.1 12.0 - 15.0 g/dL   HCT 38.2 36.0 - 46.0 %   MCV 93.9 78.0 - 100.0 fL   MCH 32.2 26.0 - 34.0 pg   MCHC 34.3 30.0 - 36.0 g/dL   RDW 13.1 11.5 - 15.5 %   Platelets 221 150 - 400 K/uL   Neutrophils Relative % 58 %  Neutro Abs 2.9 1.7 - 7.7 K/uL   Lymphocytes Relative 30 %   Lymphs Abs 1.5 0.7 - 4.0 K/uL   Monocytes Relative 7 %   Monocytes Absolute 0.3 0.1 - 1.0 K/uL   Eosinophils Relative 4 %   Eosinophils Absolute 0.2 0.0 - 0.7 K/uL   Basophils Relative 1 %   Basophils Absolute 0.0 0.0 - 0.1 K/uL    Comment: Performed at [facility], [place] [address], [place], [place] [address]  Urinalysis, Routine w reflex microscopic     Status: Abnormal   Collection Time: 11/28/17  3:17 AM  Result Value Ref Range   Color, Urine YELLOW YELLOW   APPearance HAZY (A) CLEAR   Specific Gravity, Urine 1.018 1.005 - 1.030   pH 5.0 5.0 - 8.0   Glucose, UA NEGATIVE NEGATIVE mg/dL   Hgb urine dipstick NEGATIVE NEGATIVE   Bilirubin Urine NEGATIVE NEGATIVE   Ketones, ur NEGATIVE NEGATIVE  mg/dL   Protein, ur NEGATIVE NEGATIVE mg/dL   Nitrite POSITIVE (A) NEGATIVE   Leukocytes, UA LARGE (A) NEGATIVE   RBC / HPF 0-5 0 - 5 RBC/hpf   WBC, UA >50 (H) 0 - 5 WBC/hpf   Bacteria, UA FEW (A) NONE SEEN   Squamous Epithelial / LPF 0-5 0 - 5    Comment: Please note change in reference range.   WBC Clumps PRESENT    Mucus PRESENT    Budding Yeast PRESENT    Hyaline Casts, UA PRESENT     Comment: Performed at [facility], [place] [address] [place], [place] [address]  Comprehensive metabolic panel     Status: Abnormal   Collection Time: 11/28/17  3:17 AM  Result Value Ref Range   Sodium 137 135 - 145 mmol/L   Potassium 3.1 (L) 3.5 - 5.1 mmol/L   Chloride 107 101 - 111 mmol/L   CO2 21 (L) 22 - 32 mmol/L   Glucose, Bld 194 (H) 65 - 99 mg/dL   BUN 13 6 - 20 mg/dL   Creatinine, Ser 1.02 (H) 0.44 - 1.00 mg/dL   Calcium 9.0 8.9 - 10.3 mg/dL   Total Protein 6.6 6.5 - 8.1 g/dL   Albumin 3.5 3.5 - 5.0 g/dL   AST 17 15 - 41 U/L   ALT 12 (L) 14 - 54 U/L   Alkaline Phosphatase 58 38 - 126 U/L   Total Bilirubin 0.9 0.3 - 1.2 mg/dL   GFR calc non Af Amer 54 (L) >60 mL/min   GFR calc Af Amer >60 >60 mL/min    Comment: (NOTE) The eGFR has been calculated using the CKD EPI equation. This calculation has not been validated in all clinical situations. eGFR's persistently <60 mL/min signify possible Chronic Kidney Disease.    Anion gap 9 5 - 15    Comment: Performed at [facility], [place] [address] [place], [place] [address]  I-stat troponin, ED     Status: None   Collection Time: 11/28/17  3:30 AM  Result Value Ref Range   Troponin i, poc 0.00 0.00 - 0.08 ng/mL   Comment 3            Comment: Due to the release kinetics of cTnI, a negative result within the first hours of the onset of symptoms does not rule out myocardial infarction with certainty. If myocardial infarction is still suspected, repeat the test at appropriate intervals.   I-stat  chem 8, ed     Status: Abnormal  Collection Time: 11/28/17  3:34 AM  Result Value Ref Range   Sodium 139 135 - 145 mmol/L   Potassium 3.6 3.5 - 5.1 mmol/L   Chloride 106 101 - 111 mmol/L   BUN 16 6 - 20 mg/dL   Creatinine, Ser 0.90 0.44 - 1.00 mg/dL   Glucose, Bld 190 (H) 65 - 99 mg/dL   Calcium, Ion 1.16 1.15 - 1.40 mmol/L   TCO2 24 22 - 32 mmol/L   Hemoglobin 12.9 12.0 - 15.0 g/dL   HCT 38.0 36.0 - 46.0 %  I-Stat CG4 Lactic Acid, ED  (not at  [facility])     Status: None   Collection Time: 11/28/17  5:26 AM  Result Value Ref Range   Lactic Acid, Venous 1.90 0.5 - 1.9 mmol/L   Dg Chest Portable 1 View  Result Date: 11/28/2017 CLINICAL DATA:  Hypotension EXAM: PORTABLE CHEST 1 VIEW COMPARISON:  08/15/2017 FINDINGS: Emphysematous hyperinflation of lungs. Top-normal size heart with minimal aortic atherosclerosis. No aneurysm. No acute pulmonary consolidation, effusion or pneumothorax. No suspicious osseous abnormality. IMPRESSION: Hyperinflated lungs without active pulmonary disease. Electronically Signed   By: Ashley Royalty M.D.   On: 11/28/2017 03:57    Pending Labs Unresulted Labs (From admission, onward)   Start     Ordered   11/28/17 0424  Blood Culture (routine x 2)  BLOOD CULTURE X 2,   STAT     11/28/17 0428      Vitals/Pain Today's Vitals   11/28/17 0500 11/28/17 0530 11/28/17 0600 11/28/17 0724  BP: 110/64 109/61 125/68 128/60  Pulse: (!) 57 (!) 58 (!) 59 63  Resp: _0 Temp:      TempSrc:      SpO2: 97% 98% 96% 99%  Weight:      Height:      PainSc:        Isolation Precautions No active isolations  Medications Medications  sodium chloride 0.9 % bolus 1,000 mL (0 mLs Intravenous Stopped 11/28/17 0718)    And  sodium chloride 0.9 % bolus 1,000 mL (1,000 mLs Intravenous New Bag/Given 11/28/17 0643)    And  sodium chloride 0.9 % bolus 1,000 mL (1,000 mLs Intravenous New Bag/Given 11/28/17 0718)  sodium chloride 0.9 % bolus 1,000 mL (0 mLs Intravenous Stopped  11/28/17 0508)  glucagon (human recombinant) (GLUCAGEN) injection 1 mg (1 mg Intravenous Given 11/28/17 0508)  levofloxacin (LEVAQUIN) IVPB 750 mg (0 mg Intravenous Stopped 11/28/17 0658)  aztreonam (AZACTAM) 2 GM IVPB (0 g Intravenous Stopped 11/28/17 0539)    Mobility walks with device

## 2017-11-28 NOTE — ED Notes (Signed)
This nurse applied more warm blankets on pt to continue to warm pt's body temperature, which was last 97.5 orally.

## 2017-11-28 NOTE — Progress Notes (Signed)
A consult was received from an ED physician for azactam and levaquin per pharmacy dosing.  The patient's profile has been reviewed for ht/wt/allergies/indication/available labs.   A one time order has been placed for azactam 2 gm and levaquin 750 mg.    She has tolerated ceftriaxone and cefepime in the past  Further antibiotics/pharmacy consults should be ordered by admitting physician if indicated.                       Thank you, Eudelia Bunch, Pharm.D. [phone] 11/28/2017 4:52 AM

## 2017-11-28 NOTE — ED Provider Notes (Signed)
[facility] DEPT Provider Note   CSN: [id number] Arrival date & time: 11/28/17  0255     History   Chief Complaint Chief Complaint  Patient presents with  . Hypotension    HPI [name] is a 72 y.o. female.  The history is provided by the patient.  Hyperglycemia  Severity:  Moderate Onset quality:  Gradual Timing:  Constant Progression:  Unchanged Chronicity:  Chronic Diabetes status:  Controlled with insulin Context comment:  Out of her medication Relieved by:  Nothing Ineffective treatments:  None tried Associated symptoms: no abdominal pain, no chest pain, no fever and no shortness of breath   Associated symptoms comment:  Seeing spots upon standing Risk factors: family hx of diabetes and obesity     Past Medical History:  Diagnosis Date  . Acid reflux disease   . Acute kidney injury ([place])   . Anxiety   . Depression   . Diabetes mellitus without complication ([place])   . Dizziness   . Falls   . Hydrocephalus   . Hypertension   . Hypotension   . MVP (mitral valve prolapse)   . Sinus bradycardia   . Transaminitis     Patient Active Problem List   Diagnosis Date Noted  . Chronic midline low back pain 09/22/2017  . Drug-induced hypokalemia 08/22/2017  . Current severe episode of major depressive disorder with psychotic features ([place]) 08/15/2017  . Simple chronic bronchitis ([place]) 06/28/2017  . Excessive daytime sleepiness 12/21/2016  . Dysphagia 10/13/2016  . Migraine without aura and with status migrainosus, not intractable 03/04/2016  . Left ventricular diastolic dysfunction, NYHA class 2 01/21/2016  . Obesity (BMI 30-39.9) 01/21/2016  . 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 Cervical radiculitis 08/07/2015  . GAD (generalized anxiety disorder) 07/18/2014  . Primary osteoarthritis of both knees 05/23/2014  . Visit for screening mammogram 05/22/2014  . Hyperlipidemia with target LDL less than 100 05/22/2014   . Calculus of bile duct without mention of cholecystitis or obstruction 02/26/2014  . Vitamin D deficiency 12/28/2013  . Estrogen deficiency 12/27/2013  . Routine general medical examination at a health care facility 12/27/2013  . Type II diabetes mellitus with manifestations ([place]) 08/15/2013  . OAB (overactive bladder) 08/15/2013  . Vitamin B12 deficiency anemia 06/05/2010  . HYDROCEPHALUS, NORMAL PRESSURE 06/03/2010  . Essential hypertension 05/15/2008    Past Surgical History:  Procedure Laterality Date  . BRAIN SURGERY    . BREAST BIOPSY Left   . CHOLECYSTECTOMY    . ERCP N/A 02/28/2014   Procedure: ENDOSCOPIC RETROGRADE CHOLANGIOPANCREATOGRAPHY (ERCP);  Surgeon: Gatha Mayer, MD;  Location: [facility] ENDOSCOPY;  Service: Endoscopy;  Laterality: N/A;  talked to tiffany from or /ebp  . ESOPHAGOGASTRODUODENOSCOPY N/A 02/28/2014   Procedure: ESOPHAGOGASTRODUODENOSCOPY (EGD);  Surgeon: Gatha Mayer, MD;  Location: [facility] ENDOSCOPY;  Service: Endoscopy;  Laterality: N/A;  . VENTRICULOPERITONEAL SHUNT     right occipital     OB History   None      Home Medications    Prior to Admission medications   Medication Sig Start Date End Date Taking? Authorizing Provider  atorvastatin (LIPITOR) 20 MG tablet TAKE 1 TABLET BY MOUTH EVERY DAY 11/23/17  Yes Janith Lima, MD  canagliflozin [facility]) 100 MG TABS tablet Take 1 tablet (100 mg total) by mouth daily before breakfast. 06/01/17  Yes Janith Lima, MD  cyanocobalamin 2000 MCG tablet Take 1 tablet (2,000 mcg total) by mouth daily. 08/09/15  Yes [name]  L, MD  Dextromethorphan-Guaifenesin (MUCINEX DM) 30-600 MG TB12 Take 1 tablet by mouth 2 (two) times daily as needed. Patient taking differently: Take 1 tablet by mouth 2 (two) times daily as needed (congestion).  09/08/17  Yes Janith Lima, MD  etodolac (LODINE XL) 400 MG 24 hr tablet Take 1 tablet (400 mg total) by mouth daily. 09/27/17  Yes Janith Lima, MD  glucose blood test  strip Use TID 10/13/16  Yes Janith Lima, MD  Glycopyrrolate [facility] REFILL KIT) 25 MCG/ML SOLN Inhale 1 puff into the lungs 2 (two) times daily. 09/22/17  Yes Janith Lima, MD  Insulin Pen Needle (NOVOFINE) 32G X 6 MM MISC 1 Act by Does not apply route daily. 10/13/16  Yes Janith Lima, MD  LORazepam (ATIVAN) 1 MG tablet Take 1 mg by mouth three times daily as needed for anxiety 08/03/17  Yes [provider]  nebivolol (BYSTOLIC) 10 MG tablet Take 1 tablet (10 mg total) by mouth daily. 08/22/17  Yes Janith Lima, MD  pantoprazole (PROTONIX) 40 MG tablet Take 1 tablet (40 mg total) by mouth daily. Patient taking differently: Take 40 mg by mouth daily as needed (reflux).  10/26/17  Yes Janith Lima, MD  repaglinide (PRANDIN) 2 MG tablet Take 1 tablet (2 mg total) by mouth 3 (three) times daily before meals. 11/15/16  Yes Renato Shin, MD  sertraline (ZOLOFT) 100 MG tablet Take 200 mg by mouth daily.  01/13/16  Yes [provider]  sitaGLIPtin (JANUVIA) 100 MG tablet Take 1 tablet (100 mg total) by mouth daily. 10/01/17  Yes Janith Lima, MD  SUMAtriptan (IMITREX) 50 MG tablet TAKE 1 TABLET BY MOUTH AS NEEDED FOR MIGRAINE, CAN REPEAT IN 2 HOURS IF HEADACHE PERSISTS 07/12/17  Yes Janith Lima, MD  telmisartan (MICARDIS) 80 MG tablet Take 1 tablet (80 mg total) by mouth daily. 08/16/17  Yes Bhagat, Bhavinkumar, PA  tizanidine (ZANAFLEX) 2 MG capsule TAKE ONE CAPSULE 3 TIMES A DAY 10/09/17  Yes Janith Lima, MD  traZODone (DESYREL) 50 MG tablet Take 100 mg by mouth at bedtime 07/24/15  Yes [provider]  Cholecalciferol 2000 units TABS Take 1 tablet (2,000 Units total) by mouth daily. Patient not taking: Reported on 11/28/2017 08/09/15   Janith Lima, MD  potassium chloride SA (KLOR-CON M20) 20 MEQ tablet Take 1 tablet (20 mEq total) by mouth daily. 08/16/17 11/14/17  Leanor Kail, PA  spironolactone (ALDACTONE) 25 MG tablet Take 0.5 tablets (12.5 mg  total) by mouth daily. 08/16/17 11/14/17  Leanor Kail, PA    Family History Family History  Problem Relation Age of Onset  . Heart disease Mother   . Stroke Mother   . Hypertension Mother   . Alcohol abuse Father   . Diabetes Father   . Cancer Neg Hx     Social History Social History   Tobacco Use  . Smoking status: Never Smoker  . Smokeless tobacco: Never Used  Substance Use Topics  . Alcohol use: No  . Drug use: No     Allergies   Metformin and related; Ace inhibitors; Erythromycin; Penicillins; and Topamax [topiramate]   Review of Systems Review of Systems  Constitutional: Negative for fever.  Respiratory: Negative for shortness of breath.   Cardiovascular: Negative for chest pain, palpitations and leg swelling.  Gastrointestinal: Negative for abdominal pain.  Genitourinary: Negative for frequency.  All other systems reviewed and are negative.    Physical Exam  Updated Vital Signs BP 110/64   Pulse (!) 57   Temp (!) 97.2 F (36.2 C) (Oral)   Resp 16   Ht '5\' 5"'$  (1.651 m)   Wt 95.3 kg (210 lb)   SpO2 97%   BMI 34.95 kg/m   Physical Exam  Constitutional: She is oriented to person, place, and time. She appears well-developed and well-nourished. No distress.  HENT:  Head: Normocephalic and atraumatic.  Mouth/Throat: No oropharyngeal exudate.  Eyes: Pupils are equal, round, and reactive to light. Conjunctivae are normal.  Neck: Normal range of motion. Neck supple.  Cardiovascular: Normal rate, regular rhythm, normal heart sounds and intact distal pulses.  Pulmonary/Chest: Effort normal and breath sounds normal. No stridor. She has no wheezes. She has no rales.  Abdominal: Soft. Bowel sounds are normal. She exhibits no mass. There is no tenderness. There is no rebound and no guarding.  Musculoskeletal: Normal range of motion.  Neurological: She is alert and oriented to person, place, and time. She displays normal reflexes.  Skin: Skin is warm and  dry. Capillary refill takes less than 2 seconds.  Psychiatric: She has a normal mood and affect.     ED Treatments / Results  Labs (all labs ordered are listed, but only abnormal results are displayed) Results for orders placed or performed during the hospital encounter of 11/28/17  CBC with Differential/Platelet  Result Value Ref Range   WBC 4.9 4.0 - 10.5 K/uL   RBC 4.07 3.87 - 5.11 MIL/uL   Hemoglobin 13.1 12.0 - 15.0 g/dL   HCT 38.2 36.0 - 46.0 %   MCV 93.9 78.0 - 100.0 fL   MCH 32.2 26.0 - 34.0 pg   MCHC 34.3 30.0 - 36.0 g/dL   RDW 13.1 11.5 - 15.5 %   Platelets 221 150 - 400 K/uL   Neutrophils Relative % 58 %   Neutro Abs 2.9 1.7 - 7.7 K/uL   Lymphocytes Relative 30 %   Lymphs Abs 1.5 0.7 - 4.0 K/uL   Monocytes Relative 7 %   Monocytes Absolute 0.3 0.1 - 1.0 K/uL   Eosinophils Relative 4 %   Eosinophils Absolute 0.2 0.0 - 0.7 K/uL   Basophils Relative 1 %   Basophils Absolute 0.0 0.0 - 0.1 K/uL  Urinalysis, Routine w reflex microscopic  Result Value Ref Range   Color, Urine YELLOW YELLOW   APPearance HAZY (A) CLEAR   Specific Gravity, Urine 1.018 1.005 - 1.030   pH 5.0 5.0 - 8.0   Glucose, UA NEGATIVE NEGATIVE mg/dL   Hgb urine dipstick NEGATIVE NEGATIVE   Bilirubin Urine NEGATIVE NEGATIVE   Ketones, ur NEGATIVE NEGATIVE mg/dL   Protein, ur NEGATIVE NEGATIVE mg/dL   Nitrite POSITIVE (A) NEGATIVE   Leukocytes, UA LARGE (A) NEGATIVE   RBC / HPF 0-5 0 - 5 RBC/hpf   WBC, UA >50 (H) 0 - 5 WBC/hpf   Bacteria, UA FEW (A) NONE SEEN   Squamous Epithelial / LPF 0-5 0 - 5   WBC Clumps PRESENT    Mucus PRESENT    Budding Yeast PRESENT    Hyaline Casts, UA PRESENT   Comprehensive metabolic panel  Result Value Ref Range   Sodium 137 135 - 145 mmol/L   Potassium 3.1 (L) 3.5 - 5.1 mmol/L   Chloride 107 101 - 111 mmol/L   CO2 21 (L) 22 - 32 mmol/L   Glucose, Bld 194 (H) 65 - 99 mg/dL   BUN 13 6 - 20 mg/dL  Creatinine, Ser 1.02 (H) 0.44 - 1.00 mg/dL   Calcium 9.0 8.9  - 10.3 mg/dL   Total Protein 6.6 6.5 - 8.1 g/dL   Albumin 3.5 3.5 - 5.0 g/dL   AST 17 15 - 41 U/L   ALT 12 (L) 14 - 54 U/L   Alkaline Phosphatase 58 38 - 126 U/L   Total Bilirubin 0.9 0.3 - 1.2 mg/dL   GFR calc non Af Amer 54 (L) >60 mL/min   GFR calc Af Amer >60 >60 mL/min   Anion gap 9 5 - 15  I-stat chem 8, ed  Result Value Ref Range   Sodium 139 135 - 145 mmol/L   Potassium 3.6 3.5 - 5.1 mmol/L   Chloride 106 101 - 111 mmol/L   BUN 16 6 - 20 mg/dL   Creatinine, Ser 0.90 0.44 - 1.00 mg/dL   Glucose, Bld 190 (H) 65 - 99 mg/dL   Calcium, Ion 1.16 1.15 - 1.40 mmol/L   TCO2 24 22 - 32 mmol/L   Hemoglobin 12.9 12.0 - 15.0 g/dL   HCT 38.0 36.0 - 46.0 %  I-stat troponin, ED  Result Value Ref Range   Troponin i, poc 0.00 0.00 - 0.08 ng/mL   Comment 3           Dg Chest Portable 1 View  Result Date: 11/28/2017 CLINICAL DATA:  Hypotension EXAM: PORTABLE CHEST 1 VIEW COMPARISON:  08/15/2017 FINDINGS: Emphysematous hyperinflation of lungs. Top-normal size heart with minimal aortic atherosclerosis. No aneurysm. No acute pulmonary consolidation, effusion or pneumothorax. No suspicious osseous abnormality. IMPRESSION: Hyperinflated lungs without active pulmonary disease. Electronically Signed   By: Ashley Royalty M.D.   On: 11/28/2017 03:57    EKG None  Radiology Dg Chest Portable 1 View  Result Date: 11/28/2017 CLINICAL DATA:  Hypotension EXAM: PORTABLE CHEST 1 VIEW COMPARISON:  08/15/2017 FINDINGS: Emphysematous hyperinflation of lungs. Top-normal size heart with minimal aortic atherosclerosis. No aneurysm. No acute pulmonary consolidation, effusion or pneumothorax. No suspicious osseous abnormality. IMPRESSION: Hyperinflated lungs without active pulmonary disease. Electronically Signed   By: Ashley Royalty M.D.   On: 11/28/2017 03:57    Procedures Procedures (including critical care time)  Medications Ordered in ED Medications  levofloxacin (LEVAQUIN) IVPB 750 mg (750 mg Intravenous  New Bag/Given 11/28/17 0508)  aztreonam (AZACTAM) 2 GM IVPB (2 g Intravenous New Bag/Given 11/28/17 0508)  sodium chloride 0.9 % bolus 1,000 mL (has no administration in time range)    And  sodium chloride 0.9 % bolus 1,000 mL (has no administration in time range)    And  sodium chloride 0.9 % bolus 1,000 mL (has no administration in time range)  sodium chloride 0.9 % bolus 1,000 mL (0 mLs Intravenous Stopped 11/28/17 0508)  glucagon (human recombinant) (GLUCAGEN) injection 1 mg (1 mg Intravenous Given 11/28/17 0508)     MDM Reviewed: previous chart, nursing note and vitals Interpretation: labs, ECG and x-ray (urinalysis consistent with UTI, mildly increased glucose  no PNA on cxr) Total time providing critical care: 75-105 minutes. This excludes time spent performing separately reportable procedures and services. Consults: admitting MD  CRITICAL CARE Performed by: Carlisle Beers Total critical care time: 75  minutes Critical care time was exclusive of separately billable procedures and treating other patients. Critical care was necessary to treat or prevent imminent or life-threatening deterioration. Critical care was time spent personally by me on the following activities: development of treatment plan with patient and/or surrogate as well as nursing, discussions with consultants,  evaluation of patient's response to treatment, examination of patient, obtaining history from patient or surrogate, ordering and performing treatments and interventions, ordering and review of laboratory studies, ordering and review of radiographic studies, pulse oximetry and re-evaluation of patient's condition.   Final Clinical Impressions(s) / ED Diagnoses   Will admit for sepsis   Elizandro Laura, MD 11/28/17 1155

## 2017-11-28 NOTE — ED Notes (Signed)
Bed: RESB Expected date:  Expected time:  Means of arrival:  Comments: EMS-Hypotension

## 2017-11-28 NOTE — ED Triage Notes (Signed)
Pt arrived from home via [facility] due to hypotension. Pt reports she has not been able to pay for her insulin in over a week. Pt states she has dizziness and black spots when standing.

## 2017-11-28 NOTE — Telephone Encounter (Signed)
Recived call from pharmacist Arvilla Market wanting to verify what insulin pt is taking. Pt states she takes an insulin but needing to confirm. Inform pharmacist per chart pt was previously on Toujeo, but was D/C on 05/31/17 by Dr. Ronnald Ramp. Per MD notes he states " She has not been taking Januvia or using Toujeo. She has been controlling her blood sugars with Invokana and Prandin. I have discontinued Ms. Scheirer's JANUVIA and Insulin Glargine. I have also changed her INVOKANA to canagliflozin". ..Johny Chess

## 2017-11-28 NOTE — ED Notes (Signed)
This nurse applied a warm compress to the back of the neck of the pt for neck tension.

## 2017-11-29 ENCOUNTER — Ambulatory Visit: Payer: Self-pay | Admitting: Internal Medicine

## 2017-11-29 DIAGNOSIS — R6510 Systemic inflammatory response syndrome (SIRS) of non-infectious origin without acute organ dysfunction: Secondary | ICD-10-CM | POA: Diagnosis not present

## 2017-11-29 LAB — COMPREHENSIVE METABOLIC PANEL
ALT: 11 U/L — ABNORMAL LOW (ref 14–54)
AST: 13 U/L — ABNORMAL LOW (ref 15–41)
Albumin: 2.9 g/dL — ABNORMAL LOW (ref 3.5–5.0)
Alkaline Phosphatase: 57 U/L (ref 38–126)
Anion gap: 9 (ref 5–15)
BUN: 9 mg/dL (ref 6–20)
CO2: 20 mmol/L — ABNORMAL LOW (ref 22–32)
Calcium: 9 mg/dL (ref 8.9–10.3)
Chloride: 112 mmol/L — ABNORMAL HIGH (ref 101–111)
Creatinine, Ser: 0.9 mg/dL (ref 0.44–1.00)
GFR calc Af Amer: >60 mL/min (ref >60)
GFR calc non Af Amer: >60 mL/min (ref >60)
Glucose, Bld: 138 mg/dL — ABNORMAL HIGH (ref 65–99)
Potassium: 3 mmol/L — ABNORMAL LOW (ref 3.5–5.1)
Sodium: 141 mmol/L (ref 135–145)
Total Bilirubin: 0.7 mg/dL (ref 0.3–1.2)
Total Protein: 5.9 g/dL — ABNORMAL LOW (ref 6.5–8.1)

## 2017-11-29 LAB — CBC
HCT: 36.5 % (ref 36.0–46.0)
Hemoglobin: 12.3 g/dL (ref 12.0–15.0)
MCH: 32 pg (ref 26.0–34.0)
MCHC: 33.7 g/dL (ref 30.0–36.0)
MCV: 95.1 fL (ref 78.0–100.0)
Platelets: 177 10*3/uL (ref 150–400)
RBC: 3.84 MIL/uL — ABNORMAL LOW (ref 3.87–5.11)
RDW: 13.2 % (ref 11.5–15.5)
WBC: 4.5 10*3/uL (ref 4.0–10.5)

## 2017-11-29 LAB — GLUCOSE, CAPILLARY
Glucose-Capillary: 140 mg/dL — ABNORMAL HIGH (ref 65–99)
Glucose-Capillary: 173 mg/dL — ABNORMAL HIGH (ref 65–99)
Glucose-Capillary: 120 mg/dL — ABNORMAL HIGH (ref 65–99)
Glucose-Capillary: 133 mg/dL — ABNORMAL HIGH (ref 65–99)

## 2017-11-29 MED ORDER — ALUM & MAG HYDROXIDE-SIMETH 200-200-20 MG/5ML PO SUSP
30.0000 mL | Freq: Four times a day (QID) | ORAL | Status: DC | PRN
  Administered 2017-11-30: 30 mL via ORAL
  Filled 2017-11-29: qty 30

## 2017-11-29 MED ORDER — POTASSIUM CHLORIDE CRYS ER 20 MEQ PO TBCR
40.0000 meq | EXTENDED_RELEASE_TABLET | ORAL | Status: AC
  Administered 2017-11-29 (×2): 40 meq via ORAL
  Filled 2017-11-29 (×2): qty 2

## 2017-11-29 NOTE — Care Management Obs Status (Signed)
[place] NOTIFICATION   Patient Details  Name: [name] MRN: [id number] Date of Birth: 10-Mar-1946   Medicare Observation Status Notification Given:  Yes    Lynnell Catalan, RN 11/29/2017, 2:06 PM

## 2017-11-29 NOTE — Progress Notes (Signed)
Pharmacy Progress Note - Invokana  Canagliflozin contraindications:  Urinary tract infection   Acute renal failure   eGFR less than 45 DT/OIZ/1.24 m2   Metabolic acidosis (including Diabetic ketoacidosis))  NPO status   Dehydration   Volume depletion   A/P: Patient currently on Rocephin for possible UTI - per policy, will discontinue Invokana for now. If UTI ruled out, Md to reorder medication  Adrian Saran, PharmD, BCPS Pager [phone] 11/29/2017 9:04 AM

## 2017-11-29 NOTE — Progress Notes (Signed)
[facility] Progress Note  Patient: [name] MPN:[id number]   PCP: Janith Lima, MD DOB: 07/31/1946   DOA: 11/28/2017   DOS: 11/29/2017   Date of Service: the patient was seen and examined on 11/29/2017  Subjective: Feeling better, had one episode of nausea this morning.  No vomiting.  Patient had one loose watery BM yesterday no further BM today.  Unable to provide any sample yesterday.  Brief hospital course: Pt. with PMH of type II DM, depression, anxiety, HTN; admitted on 11/28/2017, presented with complaint of nausea vomiting and diarrhea, was found to have hypotension due to hypovolemia and UTI. Currently further plan is continue IV antibiotics.  Assessment and Plan: 1.  Sepsis with UTI.  Now resolved Nausea vomiting and diarrhea. Currently symptoms are resolved. Blood pressure is better. Urine culture growing E. coli, sensitivities pending, blood culture negative. Currently on IV ceftriaxone for empiric coverage. Suspect that patient was severely dehydrated due to nausea vomiting and diarrhea and she was continuously taking her beta-blocker and therefore presented to the hospital with symptoms of dizziness and lightheadedness and low blood pressure.  2.  Essential hypertension. Hypotension. Sinus bradycardia. Prolonged QT. Monitor on telemetry. Hold Bystolic. Also hold Aldactone telmisartan. QT interval presentation is likely secondary to hypokalemia from GI losses. Will replace and monitor on telemetry.  3.  Anxiety and depression. Continuing home regimen of Zoloft, Ativan at reduced dose, trazodone at reduced dose  4.  Type 2 diabetes mellitus. Continue carb modified diet. Continue sliding scale insulin. Continue Invokana, hold other hypoglycemic agents.  Diet: Cardiac and carb modified DVT Prophylaxis: subcutaneous Heparin  Advance goals of care discussion: full code  Family Communication: no family was present at bedside, at the time of  interview.  Disposition:  Discharge to home tomorrow.  Consultants: none Procedures: none  Antibiotics: Anti-infectives (From admission, onward)   Start     Dose/Rate Route Frequency Ordered Stop   11/28/17 1600  cefTRIAXone (ROCEPHIN) 1 g in sodium chloride 0.9 % 100 mL IVPB     1 g 200 mL/hr over 30 Minutes Intravenous Every 24 hours 11/28/17 1256     11/28/17 0500  aztreonam (AZACTAM) 2 GM IVPB     2 g 100 mL/hr over 30 Minutes Intravenous  Once 11/28/17 0447 11/28/17 0539   11/28/17 0430  levofloxacin (LEVAQUIN) IVPB 750 mg     750 mg 100 mL/hr over 90 Minutes Intravenous  Once 11/28/17 0428 11/28/17 0658   11/28/17 0430  aztreonam (AZACTAM) 2 g in sodium chloride 0.9 % 100 mL IVPB  Status:  Discontinued     2 g 200 mL/hr over 30 Minutes Intravenous  Once 11/28/17 0428 11/28/17 0447       Objective: Physical Exam: Vitals:   11/28/17 2019 11/29/17 0500 11/29/17 0512 11/29/17 1322  BP: (!) 164/96  (!) 154/74 (!) 141/76  Pulse: 71  76 73  Resp: 18  18 20   Temp: 98.2 F (36.8 C)  98.2 F (36.8 C) 98.5 F (36.9 C)  TempSrc: Oral  Oral Oral  SpO2: 97%  94% 95%  Weight:  95.6 kg (210 lb 12.2 oz)    Height:        Intake/Output Summary (Last 24 hours) at 11/29/2017 1651 Last data filed at 11/29/2017 1633 Gross per 24 hour  Intake 1425 ml  Output 2100 ml  Net -675 ml   Filed Weights   11/28/17 0305 11/29/17 0500  Weight: 95.3 kg (210 lb) 95.6 kg (210 lb 12.2  oz)   General: Alert, Awake and Oriented to Time, Place and Person. Appear in mild distress, affect appropriate Eyes: PERRL, Conjunctiva normal ENT: Oral Mucosa clear moist. Neck: no JVD, no Abnormal Mass Or lumps Cardiovascular: S1 and S2 Present, no Murmur, Peripheral Pulses Present Respiratory: normal respiratory effort, Bilateral Air entry equal and Decreased, no use of accessory muscle, Clear to Auscultation, no Crackles, no wheezes Abdomen: Bowel Sound present, Soft and no tenderness, no hernia Skin: no  redness, no Rash, no induration Extremities: no Pedal edema, no calf tenderness Neurologic: Grossly no focal neuro deficit. Bilaterally Equal motor strength  Data Reviewed: CBC: Recent Labs  Lab 11/28/17 0317 11/28/17 0334 11/29/17 0505  WBC 4.9  --  4.5  NEUTROABS 2.9  --   --   HGB 13.1 12.9 12.3  HCT 38.2 38.0 36.5  MCV 93.9  --  95.1  PLT 221  --  456   Basic Metabolic Panel: Recent Labs  Lab 11/28/17 0317 11/28/17 0334 11/29/17 0505  NA 137 139 141  K 3.1* 3.6 3.0*  CL 107 106 112*  CO2 21*  --  20*  GLUCOSE 194* 190* 138*  BUN 13 16 9   CREATININE 1.02* 0.90 0.90  CALCIUM 9.0  --  9.0    Liver Function Tests: Recent Labs  Lab 11/28/17 0317 11/29/17 0505  AST 17 13*  ALT 12* 11*  ALKPHOS 58 57  BILITOT 0.9 0.7  PROT 6.6 5.9*  ALBUMIN 3.5 2.9*   No results for input(s): LIPASE, AMYLASE in the last 168 hours. No results for input(s): AMMONIA in the last 168 hours. Coagulation Profile: No results for input(s): INR, PROTIME in the last 168 hours. Cardiac Enzymes: No results for input(s): CKTOTAL, CKMB, CKMBINDEX, TROPONINI in the last 168 hours. BNP (last 3 results) No results for input(s): PROBNP in the last 8760 hours. CBG: Recent Labs  Lab 11/28/17 1647 11/28/17 2134 11/29/17 0807 11/29/17 1140  GLUCAP 132* 149* 140* 173*   Studies: No results found.  Scheduled Meds: . atorvastatin  20 mg Oral Daily  . enoxaparin (LOVENOX) injection  40 mg Subcutaneous Q24H  . etodolac  400 mg Oral Daily  . famotidine  20 mg Oral Daily  . insulin aspart  0-5 Units Subcutaneous QHS  . insulin aspart  0-9 Units Subcutaneous TID WC  . sertraline  200 mg Oral Daily  . tiZANidine  2 mg Oral TID  . cyanocobalamin  2,000 mcg Oral Daily   Continuous Infusions: . cefTRIAXone (ROCEPHIN)  IV Stopped (11/29/17 1703)   PRN Meds: acetaminophen **OR** acetaminophen, LORazepam, ondansetron **OR** ondansetron (ZOFRAN) IV, polyvinyl alcohol, traZODone  Time spent: 35  minutes  Author: Berle Mull, MD [facility] Pager: [phone] 11/29/2017 4:51 PM  If 7PM-7AM, please contact night-coverage at [email or web address], password [facility]

## 2017-11-29 NOTE — Care Management Note (Signed)
Case Management Note  Patient Details  Name: [name] MRN: [id number] Date of Birth: 12/15/1945  Pt from home and to dc back home. CM consult for medication and food needs. Pt states that she has a problem spending too much money at [employer] and has trouble affording her necessities. Pt states her insulin is ordered for 3 months at a time which is $90 copay for 3 pens. She states that she is unable to afford that much money at a time and she doesn't use 3 pens a month. This CM suggested to pt that she call her PCP office to have them only order 1 insulin pen for her at a time. She states she sees her MD on Monday and will ask them to do this. Pt also given food pantry and free meal information for [facility]. Lynnell Catalan, RN 11/29/2017, 2:09 PM

## 2017-11-29 NOTE — Evaluation (Signed)
Physical Therapy One Time Evaluation and Discharge Patient Details Name: [name] MRN: [id number] DOB: 04/09/1946 Today's Date: 11/29/2017   History of Present Illness  72 y.o. female with past medical history of type 2 diabetes mellitus, depression, anxiety, HTN, chronic sinus bradycardia, VP shunt, chronic pain and admitted for SIRS  Clinical Impression  Patient evaluated by Physical Therapy with no further acute PT needs identified. All education has been completed and the patient has no further questions.  Pt pleasant and cooperative.  Pt reports she is familiar with physical therapy and reports a hx of poor balance and falls at baseline so she now uses RW and SPC.  Pt states her mobility is at baseline, only c/o mild nausea.  See below for any follow-up Physical Therapy or equipment needs. PT is signing off. Thank you for this referral.     Follow Up Recommendations No PT follow up    Equipment Recommendations  None recommended by PT    Recommendations for Other Services       Precautions / Restrictions Precautions Precautions: Fall Restrictions Weight Bearing Restrictions: No      Mobility  Bed Mobility Overal bed mobility: Modified Independent                Transfers Overall transfer level: Modified independent Equipment used: Rolling walker (2 wheeled)                Ambulation/Gait Ambulation/Gait assistance: Supervision;Min guard Ambulation Distance (Feet): 400 Feet Assistive device: Rolling walker (2 wheeled) Gait Pattern/deviations: Step-through pattern;Decreased stride length     General Gait Details: pt ambulated with RW and appears steady with RW support, tolerated distance well, reports ambulation feels at baseline, only c/o mild nausea  Stairs            Wheelchair Mobility    Modified Rankin (Stroke Patients Only)       Balance Overall balance assessment: Mild deficits observed, not formally tested(pt reports hx of  poor balance and falls)                                           Pertinent Vitals/Pain Pain Assessment: No/denies pain    Home Living Family/patient expects to be discharged to:: Private residence Living Arrangements: Alone Available Help at Discharge: Available PRN/intermittently;Friend(s) Type of Home: House       Home Layout: One level Home Equipment: Environmental consultant - 2 wheels;Cane - single point      Prior Function Level of Independence: Needs assistance   Gait / Transfers Assistance Needed: reports she uses RW for ambulation in home and SPC in community, reports frequent falls (last fall in October) and poor balance at baseline           Hand Dominance        Extremity/Trunk Assessment        Lower Extremity Assessment Lower Extremity Assessment: Generalized weakness(reports "bad knees" at baseline due to hx of fall with bil dislocation as she reports)       Communication   Communication: HOH  Cognition Arousal/Alertness: Awake/alert Behavior During Therapy: WFL for tasks assessed/performed Overall Cognitive Status: Within Functional Limits for tasks assessed  General Comments      Exercises     Assessment/Plan    PT Assessment Patent does not need any further PT services  PT Problem List         PT Treatment Interventions      PT Goals (Current goals can be found in the Care Plan section)  Acute Rehab PT Goals PT Goal Formulation: All assessment and education complete, DC therapy    Frequency     Barriers to discharge        Co-evaluation               AM-PAC PT "6 Clicks" Daily Activity  Outcome Measure Difficulty turning over in bed (including adjusting bedclothes, sheets and blankets)?: None Difficulty moving from lying on back to sitting on the side of the bed? : None Difficulty sitting down on and standing up from a chair with arms (e.g., wheelchair,  bedside commode, etc,.)?: None Help needed moving to and from a bed to chair (including a wheelchair)?: A Little Help needed walking in hospital room?: A Little Help needed climbing 3-5 steps with a railing? : A Little 6 Click Score: 21    End of Session Equipment Utilized During Treatment: Gait belt Activity Tolerance: Patient tolerated treatment well Patient left: in bed;with bed alarm set;with call bell/phone within reach Nurse Communication: Mobility status PT Visit Diagnosis: Difficulty in walking, not elsewhere classified (R26.2)    Time: 0912-0930 PT Time Calculation (min) (ACUTE ONLY): 18 min   Charges:   PT Evaluation $PT Eval Low Complexity: 1 Low     PT G CodesCarmelia Bake, PT, DPT 11/29/2017 Pager: [phone]   York Ram E 11/29/2017, 11:32 AM

## 2017-11-30 DIAGNOSIS — I9589 Other hypotension: Secondary | ICD-10-CM

## 2017-11-30 DIAGNOSIS — N390 Urinary tract infection, site not specified: Secondary | ICD-10-CM | POA: Diagnosis not present

## 2017-11-30 DIAGNOSIS — B9620 Unspecified Escherichia coli [E. coli] as the cause of diseases classified elsewhere: Secondary | ICD-10-CM | POA: Diagnosis not present

## 2017-11-30 DIAGNOSIS — I10 Essential (primary) hypertension: Secondary | ICD-10-CM

## 2017-11-30 DIAGNOSIS — E861 Hypovolemia: Secondary | ICD-10-CM

## 2017-11-30 LAB — GASTROINTESTINAL PANEL BY PCR, STOOL (REPLACES STOOL CULTURE)

## 2017-11-30 LAB — URINE CULTURE: Culture: >=100000 — AB

## 2017-11-30 LAB — GLUCOSE, CAPILLARY
Glucose-Capillary: 136 mg/dL — ABNORMAL HIGH (ref 65–99)
Glucose-Capillary: 164 mg/dL — ABNORMAL HIGH (ref 65–99)

## 2017-11-30 MED ORDER — CEPHALEXIN 500 MG PO CAPS: 500.0000 mg | ORAL_CAPSULE | Freq: Two times a day (BID) | ORAL | 0 refills | Status: AC

## 2017-11-30 MED ORDER — CANAGLIFLOZIN 100 MG PO TABS: 100.0000 mg | ORAL_TABLET | Freq: Every day | ORAL | 0 refills | Status: DC

## 2017-11-30 MED ORDER — POTASSIUM CHLORIDE CRYS ER 20 MEQ PO TBCR: 60.0000 meq | EXTENDED_RELEASE_TABLET | Freq: Once | ORAL | Status: DC

## 2017-11-30 MED ORDER — NEBIVOLOL HCL 10 MG PO TABS: 10.0000 mg | ORAL_TABLET | Freq: Every day | ORAL | 0 refills | Status: DC

## 2017-11-30 MED ORDER — TELMISARTAN 80 MG PO TABS: 80.0000 mg | ORAL_TABLET | Freq: Every day | ORAL | 3 refills | Status: DC

## 2017-11-30 MED ORDER — CEPHALEXIN 500 MG PO CAPS: 500.0000 mg | ORAL_CAPSULE | Freq: Three times a day (TID) | ORAL | Status: DC

## 2017-11-30 MED ORDER — LORAZEPAM 1 MG PO TABS: 0.5000 mg | ORAL_TABLET | Freq: Three times a day (TID) | ORAL | 4 refills | Status: DC | PRN

## 2017-11-30 NOTE — Discharge Summary (Signed)
Physician Discharge Summary  [name]  VQQ:[id number]  DOB: September 21, 1945  DOA: 11/28/2017 PCP: Janith Lima, MD  Admit date: 11/28/2017 Discharge date: 11/30/2017  Admitted From: Home  Disposition: Home   Recommendations for Outpatient Follow-up:  1. Follow up with PCP in 1 week 2. Please obtain BMP/CBC in one week monitor renal function  Discharge Condition: Stable CODE STATUS: Full code Diet recommendation: Heart Healthy / Carb Modified  Brief/Interim Summary: For full details see H&P/Progress note, but in brief, [name] is a 72 year old female with past medical history of diabetes mellitus type 2, anxiety, depression, hypertension who was admitted on 4/29 after presenting with complaint of nausea vomiting and diarrhea.  Patient was found to have hypotension and hypovolemia and urine analysis was consistent with UTI.  Urine culture grew E. coli.  Patient was treated with antibiotic and significantly improved and was deemed stable for discharge.  Subjective: Patient seen and examined, doing well this morning.  Remains afebrile.  Has no complaints.  No more diarrhea.  Tolerating diet well.  Discharge Diagnoses/Hospital Course:  Sepsis secondary to E. coli UTI Septic physiology has resolved. Urine culture grew E. coli pansensitive patient was treated with IV ceftriaxone will switch to Keflex to complete total of 7 days.  Essential hypertension Initially hypotensive sinus bradycardic, suspected due to severe dehydration from nausea/vomiting and diarrhea and continuously taking beta-blocker causing dizziness and lightheadedness. Resume home blood pressure medications in a.m. Prolonged QTC on admission from hypokalemia due to GI losses Potassium was repleted Follow-up with PCP  Type 2 diabetes mellitus CBGs stable during hospital stay Continue home medication with no changes  Anxiety and depression Stable Continue home medication  Hypokalemia  Due to  diarrhea, K repleted  Patient on K supplements  Check BMP in 1 week   All other chronic medical condition were stable during the hospitalization.  On the day of the discharge the patient's vitals were stable, and no other acute medical condition were reported by patient. the patient was felt safe to be discharge to home  Discharge Instructions  You were cared for by a hospitalist during your hospital stay. If you have any questions about your discharge medications or the care you received while you were in the hospital after you are discharged, you can call the unit and asked to speak with the hospitalist on call if the hospitalist that took care of you is not available. Once you are discharged, your primary care physician will handle any further medical issues. Please note that NO REFILLS for any discharge medications will be authorized once you are discharged, as it is imperative that you return to your primary care physician (or establish a relationship with a primary care physician if you do not have one) for your aftercare needs so that they can reassess your need for medications and monitor your lab values.  Discharge Instructions    Call MD for:  difficulty breathing, headache or visual disturbances   Complete by:  As directed    Call MD for:  extreme fatigue   Complete by:  As directed    Call MD for:  hives   Complete by:  As directed    Call MD for:  persistant dizziness or light-headedness   Complete by:  As directed    Call MD for:  persistant nausea and vomiting   Complete by:  As directed    Call MD for:  redness, tenderness, or signs of infection (pain, swelling, redness, odor or  green/yellow discharge around incision site)   Complete by:  As directed    Call MD for:  severe uncontrolled pain   Complete by:  As directed    Call MD for:  temperature >100.4   Complete by:  As directed    Diet - low sodium heart healthy   Complete by:  As directed    Increase activity slowly    Complete by:  As directed      Allergies as of 11/30/2017      Reactions   Metformin And Related Diarrhea   (takes metformin at home w/ meals)   Ace Inhibitors Other (See Comments)   unknown   Erythromycin Hives   Penicillins Hives   Has patient had a PCN reaction causing immediate rash, facial/tongue/throat swelling, SOB or lightheadedness with hypotension: Yes Has patient had a PCN reaction causing severe rash involving mucus membranes or skin necrosis: No Has patient had a PCN reaction that required hospitalization: No Has patient had a PCN reaction occurring within the last 10 years: No If all of the above answers are "NO", then may proceed with Cephalosporin use.   Topamax [topiramate] Rash      Medication List    STOP taking these medications   spironolactone 25 MG tablet Commonly known as:  ALDACTONE     TAKE these medications   atorvastatin 20 MG tablet Commonly known as:  LIPITOR TAKE 1 TABLET BY MOUTH EVERY DAY   canagliflozin 100 MG Tabs tablet Commonly known as:  INVOKANA Take 1 tablet (100 mg total) by mouth daily before breakfast.   cephALEXin 500 MG capsule Commonly known as:  KEFLEX Take 1 capsule (500 mg total) by mouth 2 (two) times daily for 4 days.   cyanocobalamin 2000 MCG tablet Take 1 tablet (2,000 mcg total) by mouth daily.   etodolac 400 MG 24 hr tablet Commonly known as:  LODINE XL Take 1 tablet (400 mg total) by mouth daily.   glucose blood test strip Use TID   Glycopyrrolate 25 MCG/ML Soln Commonly known as:  LONHALA MAGNAIR REFILL KIT Inhale 1 puff into the lungs 2 (two) times daily.   Insulin Pen Needle 32G X 6 MM Misc Commonly known as:  NOVOFINE 1 Act by Does not apply route daily.   LORazepam 1 MG tablet Commonly known as:  ATIVAN Take 0.5 tablets (0.5 mg total) by mouth every 8 (eight) hours as needed for anxiety. What changed:  See the new instructions.   MUCINEX DM 30-600 MG Tb12 Take 1 tablet by mouth 2 (two) times  daily as needed. What changed:  reasons to take this   nebivolol 10 MG tablet Commonly known as:  BYSTOLIC Take 1 tablet (10 mg total) by mouth daily. Start taking on:  12/01/2017   pantoprazole 40 MG tablet Commonly known as:  PROTONIX Take 1 tablet (40 mg total) by mouth daily. What changed:    when to take this  reasons to take this   potassium chloride SA 20 MEQ tablet Commonly known as:  KLOR-CON M20 Take 1 tablet (20 mEq total) by mouth daily.   repaglinide 2 MG tablet Commonly known as:  PRANDIN Take 1 tablet (2 mg total) by mouth 3 (three) times daily before meals.   sertraline 100 MG tablet Commonly known as:  ZOLOFT Take 200 mg by mouth daily.   sitaGLIPtin 100 MG tablet Commonly known as:  JANUVIA Take 1 tablet (100 mg total) by mouth daily.   SUMAtriptan 50 MG  tablet Commonly known as:  IMITREX TAKE 1 TABLET BY MOUTH AS NEEDED FOR MIGRAINE, CAN REPEAT IN 2 HOURS IF HEADACHE PERSISTS   telmisartan 80 MG tablet Commonly known as:  MICARDIS Take 1 tablet (80 mg total) by mouth daily. Start taking on:  12/01/2017   tizanidine 2 MG capsule Commonly known as:  ZANAFLEX TAKE ONE CAPSULE 3 TIMES A DAY   traZODone 50 MG tablet Commonly known as:  DESYREL Take 100 mg by mouth at bedtime      Follow-up Information    Janith Lima, MD. Schedule an appointment as soon as possible for a visit in 1 week(s).   Specialty:  Internal Medicine Why:  Hospital follow-up Contact information: [address] [place] [address] [phone]          Allergies  Allergen Reactions  . Metformin And Related Diarrhea    (takes metformin at home w/ meals)  . Ace Inhibitors Other (See Comments)    unknown  . Erythromycin Hives  . Penicillins Hives    Has patient had a PCN reaction causing immediate rash, facial/tongue/throat swelling, SOB or lightheadedness with hypotension: Yes Has patient had a PCN reaction causing severe rash involving mucus  membranes or skin necrosis: No Has patient had a PCN reaction that required hospitalization: No Has patient had a PCN reaction occurring within the last 10 years: No If all of the above answers are "NO", then may proceed with Cephalosporin use.   . Topamax [Topiramate] Rash    Consultations:     Procedures/Studies: Dg Chest Portable 1 View  Result Date: 11/28/2017 CLINICAL DATA:  Hypotension EXAM: PORTABLE CHEST 1 VIEW COMPARISON:  08/15/2017 FINDINGS: Emphysematous hyperinflation of lungs. Top-normal size heart with minimal aortic atherosclerosis. No aneurysm. No acute pulmonary consolidation, effusion or pneumothorax. No suspicious osseous abnormality. IMPRESSION: Hyperinflated lungs without active pulmonary disease. Electronically Signed   By: Ashley Royalty M.D.   On: 11/28/2017 03:57    Discharge Exam: Vitals:   11/29/17 2034 11/30/17 0428  BP: (!) 150/71 (!) 159/92  Pulse: 74 87  Resp: 18 20  Temp: 98.1 F (36.7 C) 98.6 F (37 C)  SpO2: 96% 92%   Vitals:   11/29/17 1322 11/29/17 2034 11/30/17 0428 11/30/17 0439  BP: (!) 141/76 (!) 150/71 (!) 159/92   Pulse: 73 74 87   Resp: '20 18 20   '$ Temp: 98.5 F (36.9 C) 98.1 F (36.7 C) 98.6 F (37 C)   TempSrc: Oral Oral Oral   SpO2: 95% 96% 92%   Weight:    96 kg (211 lb 11.2 oz)  Height:        General: NAD Cardiovascular: RRR, S1/S2 +, no rubs, no gallops Respiratory: CTA bilaterally, no wheezing, no rhonchi Abdominal: Soft, NT, ND, bowel sounds + Extremities: no edema  The results of significant diagnostics from this hospitalization (including imaging, microbiology, ancillary and laboratory) are listed below for reference.     Microbiology: Recent Results (from the past 240 hour(s))  Blood Culture (routine x 2)     Status: None (Preliminary result)   Collection Time: 11/28/17  4:24 AM  Result Value Ref Range Status   Specimen Description   Final    BLOOD LEFT HAND Performed at [facility] [address] [place], [place] [address]    Special Requests   Final    BOTTLES DRAWN AEROBIC AND ANAEROBIC Blood Culture adequate volume Performed at [facility]  [address] [place], [place] [address]    Culture   Final    NO GROWTH 2 DAYS Performed at [facility], [place] [address] [place], [place] [address]    Report Status PENDING  Incomplete  Blood Culture (routine x 2)     Status: None (Preliminary result)   Collection Time: 11/28/17  4:29 AM  Result Value Ref Range Status   Specimen Description   Final    BLOOD RIGHT FOREARM Performed at [facility] [address], [place], [place] [address]    Special Requests   Final    BOTTLES DRAWN AEROBIC AND ANAEROBIC Blood Culture adequate volume Performed at [facility] [address] [place], [place] [address]    Culture   Final    NO GROWTH 2 DAYS Performed at [facility] [address] [place], [place] [address]    Report Status PENDING  Incomplete  Urine culture     Status: Abnormal   Collection Time: 11/28/17  9:29 AM  Result Value Ref Range Status   Specimen Description   Final    Urine Performed at [facility] [address] [place], [place] [address]    Special Requests   Final    NONE Performed at [facility], [place] [address], [place], [place] [address]    Culture >=100,000 COLONIES/mL ESCHERICHIA COLI (A)  Final   Report Status 11/30/2017 FINAL  Final   Organism ID, Bacteria ESCHERICHIA COLI (A)  Final      Susceptibility   Escherichia coli - MIC*    AMPICILLIN <=2 SENSITIVE Sensitive     CEFAZOLIN <=4 SENSITIVE Sensitive     CEFEPIME <=1 SENSITIVE Sensitive     CEFTAZIDIME <=1 SENSITIVE Sensitive     CEFTRIAXONE <=1 SENSITIVE Sensitive     CIPROFLOXACIN <=0.25 SENSITIVE Sensitive     GENTAMICIN <=1 SENSITIVE Sensitive     IMIPENEM <=0.25 SENSITIVE Sensitive     TRIMETH/SULFA <=20  SENSITIVE Sensitive     AMPICILLIN/SULBACTAM <=2 SENSITIVE Sensitive     PIP/TAZO <=4 SENSITIVE Sensitive     Extended ESBL NEGATIVE Sensitive     * >=100,000 COLONIES/mL ESCHERICHIA COLI     Labs: BNP (last 3 results) No results for input(s): BNP in the last 8760 hours. Basic Metabolic Panel: Recent Labs  Lab 11/28/17 0317 11/28/17 0334 11/29/17 0505  NA 137 139 141  K 3.1* 3.6 3.0*  CL 107 106 112*  CO2 21*  --  20*  GLUCOSE 194* 190* 138*  BUN '13 16 9  '$ CREATININE 1.02* 0.90 0.90  CALCIUM 9.0  --  9.0   Liver Function Tests: Recent Labs  Lab 11/28/17 0317 11/29/17 0505  AST 17 13*  ALT 12* 11*  ALKPHOS 58 57  BILITOT 0.9 0.7  PROT 6.6 5.9*  ALBUMIN 3.5 2.9*   No results for input(s): LIPASE, AMYLASE in the last 168 hours. No results for input(s): AMMONIA in the last 168 hours. CBC: Recent Labs  Lab 11/28/17 0317 11/28/17 0334 11/29/17 0505  WBC 4.9  --  4.5  NEUTROABS 2.9  --   --   HGB 13.1 12.9 12.3  HCT 38.2 38.0 36.5  MCV 93.9  --  95.1  PLT 221  --  177   Cardiac Enzymes: No results for input(s): CKTOTAL, CKMB, CKMBINDEX, TROPONINI in the last 168 hours. BNP: Invalid input(s): POCBNP CBG: Recent Labs  Lab 11/29/17 1140 11/29/17 1648 11/29/17 2035 11/30/17 0751  11/30/17 1201  GLUCAP 173* 120* 133* 136* 164*   D-Dimer No results for input(s): DDIMER in the last 72 hours. Hgb A1c No results for input(s): HGBA1C in the last 72 hours. Lipid Profile No results for input(s): CHOL, HDL, LDLCALC, TRIG, CHOLHDL, LDLDIRECT in the last 72 hours. Thyroid function studies No results for input(s): TSH, T4TOTAL, T3FREE, THYROIDAB in the last 72 hours.  Invalid input(s): FREET3 Anemia work up No results for input(s): VITAMINB12, FOLATE, FERRITIN, TIBC, IRON, RETICCTPCT in the last 72 hours. Urinalysis    Component Value Date/Time   COLORURINE YELLOW 11/28/2017 0317   APPEARANCEUR HAZY (A) 11/28/2017 0317   LABSPEC 1.018 11/28/2017 0317    PHURINE 5.0 11/28/2017 0317   GLUCOSEU NEGATIVE 11/28/2017 0317   GLUCOSEU >=1000 (A) 05/31/2017 1155   HGBUR NEGATIVE 11/28/2017 0317   BILIRUBINUR NEGATIVE 11/28/2017 0317   KETONESUR NEGATIVE 11/28/2017 0317   PROTEINUR NEGATIVE 11/28/2017 0317   UROBILINOGEN 0.2 05/31/2017 1155   NITRITE POSITIVE (A) 11/28/2017 0317   LEUKOCYTESUR LARGE (A) 11/28/2017 0317   Sepsis Labs Invalid input(s): PROCALCITONIN,  WBC,  LACTICIDVEN Microbiology Recent Results (from the past 240 hour(s))  Blood Culture (routine x 2)     Status: None (Preliminary result)   Collection Time: 11/28/17  4:24 AM  Result Value Ref Range Status   Specimen Description   Final    BLOOD LEFT HAND Performed at [facility], [place] [address] [place], [place] [address]    Special Requests   Final    BOTTLES DRAWN AEROBIC AND ANAEROBIC Blood Culture adequate volume Performed at [facility] [address], [place], [place] [address]    Culture   Final    NO GROWTH 2 DAYS Performed at [facility] [address], [place], [place] [address]    Report Status PENDING  Incomplete  Blood Culture (routine x 2)     Status: None (Preliminary result)   Collection Time: 11/28/17  4:29 AM  Result Value Ref Range Status   Specimen Description   Final    BLOOD RIGHT FOREARM Performed at [facility] [address], [place], [place] [address]    Special Requests   Final    BOTTLES DRAWN AEROBIC AND ANAEROBIC Blood Culture adequate volume Performed at [facility] [address], [place], [place] [address]    Culture   Final    NO GROWTH 2 DAYS Performed at [facility] [address], [place], [place] [address]    Report Status PENDING  Incomplete  Urine culture     Status: Abnormal   Collection Time: 11/28/17  9:29 AM  Result Value Ref Range Status   Specimen Description   Final    Urine Performed at [facility] [address], [place], [place] [address]    Special Requests   Final    NONE Performed at [facility], [place] [address], [place], [place] [address]    Culture >=100,000 COLONIES/mL ESCHERICHIA COLI (A)  Final   Report Status 11/30/2017 FINAL  Final   Organism ID, Bacteria ESCHERICHIA COLI (A)  Final      Susceptibility   Escherichia coli - MIC*    AMPICILLIN <=2 SENSITIVE Sensitive     CEFAZOLIN <=4 SENSITIVE Sensitive     CEFEPIME <=1 SENSITIVE Sensitive     CEFTAZIDIME <=1 SENSITIVE Sensitive     CEFTRIAXONE <=1 SENSITIVE Sensitive     CIPROFLOXACIN <=  0.25 SENSITIVE Sensitive     GENTAMICIN <=1 SENSITIVE Sensitive     IMIPENEM <=0.25 SENSITIVE Sensitive     TRIMETH/SULFA <=20 SENSITIVE Sensitive     AMPICILLIN/SULBACTAM <=2 SENSITIVE Sensitive     PIP/TAZO <=4 SENSITIVE Sensitive     Extended ESBL NEGATIVE Sensitive     * >=100,000 COLONIES/mL ESCHERICHIA COLI     Time coordinating discharge: 32 minutes  SIGNED:  Chipper Oman, MD  [facility] 11/30/2017, 12:36 PM  Pager please text page via  [email or web address]  Note - This record has been created using [employer]. Chart creation errors have been sought, but may not always have been located. Such creation errors do not reflect on the standard of medical care.

## 2017-11-30 NOTE — Progress Notes (Signed)
Pt discharged from the unit via wheelchair. Discharge instructions reviewed with pt. No questions or concerns @ this time.

## 2017-12-01 ENCOUNTER — Telehealth: Payer: Self-pay | Admitting: *Deleted

## 2017-12-01 NOTE — Telephone Encounter (Signed)
Transition Care Management Follow-up Telephone Call   Date discharged? 11/30/17   How have you been since you were released from the hospital? Pt states she is doing alright   Do you understand why you were in the hospital? YES   Do you understand the discharge instructions? YES   Where were you discharged to? Home   Items Reviewed:  Medications reviewed: YES, still taking antibiotic  Allergies reviewed: YES  Dietary changes reviewed: YES, diabetic and carb modified  Referrals reviewed: No referral needd   Functional Questionnaire:   Activities of Daily Living (ADLs):   She states she are independent in the following: bathing and hygiene, feeding, continence, grooming, toileting and dressing States they require assistance with the following: ambulation   Any transportation issues/concerns?: NO   Any patient concerns? NO   Confirmed importance and date/time of follow-up visits scheduled YES, appt 12/05/17  Provider Appointment booked with Dr. Ronnald Ramp  Confirmed with patient if condition begins to worsen call PCP or go to the ER.  Patient was given the office number and encouraged to call back with question or concerns.  : YES

## 2017-12-03 LAB — CULTURE, BLOOD (ROUTINE X 2)
Culture: NO GROWTH
Culture: NO GROWTH
Special Requests: ADEQUATE
Special Requests: ADEQUATE

## 2017-12-05 ENCOUNTER — Encounter: Payer: Self-pay | Admitting: Internal Medicine

## 2017-12-05 ENCOUNTER — Ambulatory Visit (INDEPENDENT_AMBULATORY_CARE_PROVIDER_SITE_OTHER): Payer: Medicare Other | Admitting: Internal Medicine

## 2017-12-05 VITALS — BP 160/80 | HR 104 | Temp 98.4°F | Ht 65.0 in | Wt 211.0 lb

## 2017-12-05 DIAGNOSIS — T50905A Adverse effect of unspecified drugs, medicaments and biological substances, initial encounter: Secondary | ICD-10-CM | POA: Diagnosis not present

## 2017-12-05 DIAGNOSIS — I10 Essential (primary) hypertension: Secondary | ICD-10-CM

## 2017-12-05 DIAGNOSIS — E876 Hypokalemia: Secondary | ICD-10-CM | POA: Diagnosis not present

## 2017-12-05 DIAGNOSIS — N390 Urinary tract infection, site not specified: Secondary | ICD-10-CM | POA: Insufficient documentation

## 2017-12-05 DIAGNOSIS — B9620 Unspecified Escherichia coli [E. coli] as the cause of diseases classified elsewhere: Secondary | ICD-10-CM | POA: Diagnosis not present

## 2017-12-05 NOTE — Progress Notes (Signed)
Subjective:  Patient ID: [name], female    DOB: September 09, 1945  Age: 72 y.o. MRN: [id number]  CC: Urinary Tract Infection and Hypertension   HPI [name] presents for f/up - she was recently admitted for a UTI with sepsis - she was treated with 4 days of IV antibiotics and is completing a course of Keflex.  She feels well today and offers no complaints.  Recommendations for Outpatient Follow-up:  1. Follow up with PCP in 1 week 2. Please obtain BMP/CBC in one week monitor renal function  Discharge Condition: Stable CODE STATUS: Full code Diet recommendation: Heart Healthy / Carb Modified  Brief/Interim Summary: For full details see H&P/Progress note, but in brief, [name] is a 72 year old female with past medical history of diabetes mellitus type 2, anxiety, depression, hypertension who was admitted on 4/29 after presenting with complaint of nausea vomiting and diarrhea.  Patient was found to have hypotension and hypovolemia and urine analysis was consistent with UTI.  Urine culture grew E. coli.  Patient was treated with antibiotic and significantly improved and was deemed stable for discharge.   Outpatient Medications Prior to Visit  Medication Sig Dispense Refill  . atorvastatin (LIPITOR) 20 MG tablet TAKE 1 TABLET BY MOUTH EVERY DAY 90 tablet 1  . canagliflozin (INVOKANA) 100 MG TABS tablet Take 1 tablet (100 mg total) by mouth daily before breakfast. 90 tablet 0  . cyanocobalamin 2000 MCG tablet Take 1 tablet (2,000 mcg total) by mouth daily. 90 tablet 3  . Dextromethorphan-Guaifenesin (MUCINEX DM) 30-600 MG TB12 Take 1 tablet by mouth 2 (two) times daily as needed. (Patient taking differently: Take 1 tablet by mouth 2 (two) times daily as needed (congestion). ) 60 tablet 5  . etodolac (LODINE XL) 400 MG 24 hr tablet Take 1 tablet (400 mg total) by mouth daily. 90 tablet 0  . glucose blood test strip Use TID 100 each 11  . Glycopyrrolate (LONHALA  MAGNAIR REFILL KIT) 25 MCG/ML SOLN Inhale 1 puff into the lungs 2 (two) times daily. 60 mL 11  . Insulin Pen Needle (NOVOFINE) 32G X 6 MM MISC 1 Act by Does not apply route daily. 100 each 3  . LORazepam (ATIVAN) 1 MG tablet Take 0.5 tablets (0.5 mg total) by mouth every 8 (eight) hours as needed for anxiety. 30 tablet 4  . nebivolol (BYSTOLIC) 10 MG tablet Take 1 tablet (10 mg total) by mouth daily. 126 tablet 0  . pantoprazole (PROTONIX) 40 MG tablet Take 1 tablet (40 mg total) by mouth daily. (Patient taking differently: Take 40 mg by mouth daily as needed (reflux). ) 90 tablet 3  . repaglinide (PRANDIN) 2 MG tablet Take 1 tablet (2 mg total) by mouth 3 (three) times daily before meals. 90 tablet 11  . sertraline (ZOLOFT) 100 MG tablet Take 200 mg by mouth daily.   12  . sitaGLIPtin (JANUVIA) 100 MG tablet Take 1 tablet (100 mg total) by mouth daily. 90 tablet 1  . SUMAtriptan (IMITREX) 50 MG tablet TAKE 1 TABLET BY MOUTH AS NEEDED FOR MIGRAINE, CAN REPEAT IN 2 HOURS IF HEADACHE PERSISTS 10 tablet 5  . telmisartan (MICARDIS) 80 MG tablet Take 1 tablet (80 mg total) by mouth daily. 90 tablet 3  . tizanidine (ZANAFLEX) 2 MG capsule TAKE ONE CAPSULE 3 TIMES A DAY 270 capsule 1  . traZODone (DESYREL) 50 MG tablet Take 100 mg by mouth at bedtime  3  . potassium chloride SA (KLOR-CON  M20) 20 MEQ tablet Take 1 tablet (20 mEq total) by mouth daily. 90 tablet 3   No facility-administered medications prior to visit.     ROS Review of Systems  Constitutional: Negative for chills, diaphoresis, fatigue and fever.  HENT: Negative.   Eyes: Negative for visual disturbance.  Respiratory: Negative for cough, chest tightness, shortness of breath and wheezing.   Cardiovascular: Negative.  Negative for chest pain, palpitations and leg swelling.  Gastrointestinal: Negative.  Negative for abdominal pain, diarrhea, nausea and vomiting.  Endocrine: Negative.   Genitourinary: Negative.  Negative for decreased  urine volume, difficulty urinating, dysuria, flank pain, frequency, hematuria and urgency.  Musculoskeletal: Negative.  Negative for back pain and myalgias.  Skin: Negative.  Negative for color change.  Neurological: Negative.  Negative for dizziness, weakness and light-headedness.  Hematological: Negative for adenopathy. Does not bruise/bleed easily.  Psychiatric/Behavioral: Negative.     Objective:  BP (!) 160/80 (BP Location: Left Arm, Patient Position: Sitting, Cuff Size: Large)   Pulse (!) 104   Temp 98.4 F (36.9 C) (Oral)   Ht _0  (1.651 m)   Wt 211 lb (95.7 kg)   SpO2 97%   BMI 35.11 kg/m   BP Readings from Last 3 Encounters:  12/05/17 (!) 160/80  11/30/17 (!) 159/92  11/14/17 (!) 150/80    Wt Readings from Last 3 Encounters:  12/05/17 211 lb (95.7 kg)  11/30/17 211 lb 11.2 oz (96 kg)  11/14/17 210 lb (95.3 kg)    Physical Exam  Constitutional: She is oriented to person, place, and time. No distress.  HENT:  Mouth/Throat: Oropharynx is clear and moist. No oropharyngeal exudate.  Eyes: Conjunctivae are normal. No scleral icterus.  Neck: Normal range of motion. Neck supple. No JVD present. No tracheal deviation present.  Cardiovascular: Normal rate, regular rhythm and normal heart sounds. Exam reveals no gallop and no friction rub.  No murmur heard. Pulmonary/Chest: Effort normal and breath sounds normal. No stridor. No respiratory distress. She has no wheezes. She has no rales.  Abdominal: Soft. Bowel sounds are normal. She exhibits no mass. There is no hepatosplenomegaly. There is no tenderness.  Musculoskeletal: Normal range of motion. She exhibits no edema, tenderness or deformity.  Lymphadenopathy:    She has no cervical adenopathy.  Neurological: She is alert and oriented to person, place, and time.  Skin: Skin is warm and dry. She is not diaphoretic.  Vitals reviewed.   Lab Results  Component Value Date   WBC 4.5 11/29/2017   HGB 12.3 11/29/2017    HCT 36.5 11/29/2017   PLT 177 11/29/2017   GLUCOSE 102 (H) 12/06/2017   CHOL 186 05/31/2017   TRIG 230.0 (H) 05/31/2017   HDL 50.00 05/31/2017   LDLDIRECT 106.0 05/31/2017   LDLCALC 124 (H) 05/11/2016   ALT 11 (L) 11/29/2017   AST 13 (L) 11/29/2017   NA 143 12/06/2017   K 3.3 (L) 12/06/2017   CL 109 12/06/2017   CREATININE 0.73 12/06/2017   BUN 11 12/06/2017   CO2 24 12/06/2017   TSH 0.82 08/15/2017   INR 1.33 07/25/2014   HGBA1C 6.9 10/01/2017   MICROALBUR 3.3 (H) 05/31/2017    Dg Chest Portable 1 View  Result Date: 11/28/2017 CLINICAL DATA:  Hypotension EXAM: PORTABLE CHEST 1 VIEW COMPARISON:  08/15/2017 FINDINGS: Emphysematous hyperinflation of lungs. Top-normal size heart with minimal aortic atherosclerosis. No aneurysm. No acute pulmonary consolidation, effusion or pneumothorax. No suspicious osseous abnormality. IMPRESSION: Hyperinflated lungs without active pulmonary disease. Electronically  Signed   By: Ashley Royalty M.D.   On: 11/28/2017 03:57    Assessment & Plan:   Larin was seen today for urinary tract infection and hypertension.  Diagnoses and all orders for this visit:  Drug-induced hypokalemia- Her potassium level is better than it was during the admission but is still low at 3.3.  I have asked her to increase the dose of her potassium supplement.  I will also screen her for primary hyperaldosteronism. -     Basic metabolic panel; Future -     Aldosterone + renin activity w/ ratio; Future  Essential hypertension- Her blood pressure is adequately well controlled.  I will treat the hyperkalemia and will screen her for aldosteronism. -     Basic metabolic panel; Future -     Aldosterone + renin activity w/ ratio; Future -     potassium chloride SA (KLOR-CON M20) 20 MEQ tablet; Take 1 tablet (20 mEq total) by mouth 2 (two) times daily.  E. coli UTI (urinary tract infection)- Based on her symptoms and exam this has resolved.  I will recheck her urine culture to confirm  that the infection has been eradicated. -     CULTURE, URINE COMPREHENSIVE; Future  Hypokalemia- as above -     potassium chloride SA (KLOR-CON M20) 20 MEQ tablet; Take 1 tablet (20 mEq total) by mouth 2 (two) times daily.   I have changed Karen Chafe. Dorsey's potassium chloride SA. I am also having her maintain her traZODone, cyanocobalamin, sertraline, glucose blood, Insulin Pen Needle, repaglinide, SUMAtriptan, MUCINEX DM, Glycopyrrolate, etodolac, sitaGLIPtin, tizanidine, pantoprazole, atorvastatin, nebivolol, telmisartan, LORazepam, and canagliflozin.  Meds ordered this encounter  Medications  . potassium chloride SA (KLOR-CON M20) 20 MEQ tablet    Sig: Take 1 tablet (20 mEq total) by mouth 2 (two) times daily.    Dispense:  180 tablet    Refill:  1     Follow-up: Return in about 3 months (around 03/07/2018).  [name], MD

## 2017-12-05 NOTE — Patient Instructions (Signed)

## 2017-12-06 ENCOUNTER — Other Ambulatory Visit (INDEPENDENT_AMBULATORY_CARE_PROVIDER_SITE_OTHER): Payer: Medicare Other

## 2017-12-06 DIAGNOSIS — E876 Hypokalemia: Secondary | ICD-10-CM | POA: Diagnosis not present

## 2017-12-06 DIAGNOSIS — I10 Essential (primary) hypertension: Secondary | ICD-10-CM

## 2017-12-06 DIAGNOSIS — N390 Urinary tract infection, site not specified: Secondary | ICD-10-CM

## 2017-12-06 DIAGNOSIS — B9620 Unspecified Escherichia coli [E. coli] as the cause of diseases classified elsewhere: Secondary | ICD-10-CM

## 2017-12-06 DIAGNOSIS — T50905A Adverse effect of unspecified drugs, medicaments and biological substances, initial encounter: Secondary | ICD-10-CM | POA: Diagnosis not present

## 2017-12-06 LAB — BASIC METABOLIC PANEL
BUN: 11 mg/dL (ref 6–23)
CO2: 24 mEq/L (ref 19–32)
Calcium: 9 mg/dL (ref 8.4–10.5)
Chloride: 109 mEq/L (ref 96–112)
Creatinine, Ser: 0.73 mg/dL (ref 0.40–1.20)
GFR: 83.57 mL/min (ref >60.00)
Glucose, Bld: 102 mg/dL — ABNORMAL HIGH (ref 70–99)
Potassium: 3.3 mEq/L — ABNORMAL LOW (ref 3.5–5.1)
Sodium: 143 mEq/L (ref 135–145)

## 2017-12-06 MED ORDER — POTASSIUM CHLORIDE CRYS ER 20 MEQ PO TBCR: 20.0000 meq | EXTENDED_RELEASE_TABLET | Freq: Two times a day (BID) | ORAL | 1 refills | Status: DC

## 2017-12-08 LAB — CULTURE, URINE COMPREHENSIVE
MICRO NUMBER:: 90555253
RESULT:: NO GROWTH
SPECIMEN QUALITY:: ADEQUATE

## 2017-12-11 LAB — ALDOSTERONE + RENIN ACTIVITY W/ RATIO
ALDO / PRA Ratio: 9.1 Ratio (ref 0.9–28.9)
Aldosterone: 3 ng/dL
Renin Activity: 0.33 ng/mL/h (ref 0.25–5.82)

## 2017-12-19 ENCOUNTER — Ambulatory Visit (INDEPENDENT_AMBULATORY_CARE_PROVIDER_SITE_OTHER): Payer: Medicare Other | Admitting: Family

## 2017-12-19 ENCOUNTER — Ambulatory Visit: Payer: Self-pay

## 2017-12-19 ENCOUNTER — Encounter: Payer: Self-pay | Admitting: Family

## 2017-12-19 VITALS — BP 142/82 | HR 90 | Temp 98.0°F | Ht 65.0 in | Wt 213.0 lb

## 2017-12-19 DIAGNOSIS — S40021A Contusion of right upper arm, initial encounter: Secondary | ICD-10-CM | POA: Diagnosis not present

## 2017-12-19 DIAGNOSIS — T148XXA Other injury of unspecified body region, initial encounter: Secondary | ICD-10-CM

## 2017-12-19 NOTE — Patient Instructions (Signed)
Hematoma A hematoma is a collection of blood. The collection of blood can turn into a hard, painful lump under the skin. Your skin may turn blue or yellow if the hematoma is close to the surface of the skin. Most hematomas get better in a few days to weeks. Some hematomas are serious and need medical care. Hematomas can be very small or very big. Follow these instructions at home:  Apply ice to the injured area: ? Put ice in a plastic bag. ? Place a towel between your skin and the bag. ? Leave the ice on for 20 minutes, 2-3 times a day for the first 1 to 2 days.  After the first 2 days, switch to using warm packs on the injured area.  Raise (elevate) the injured area to lessen pain and puffiness (swelling). You may also wrap the area with an elastic bandage. Make sure the bandage is not wrapped too tight.  If you have a painful hematoma on your leg or foot, you may use crutches for a couple days.  Only take medicines as told by your doctor. Get help right away if:  Your pain gets worse.  Your pain is not controlled with medicine.  You have a fever.  Your puffiness gets worse.  Your skin turns more blue or yellow.  Your skin over the hematoma breaks or starts bleeding.  Your hematoma is in your chest or belly (abdomen) and you are short of breath, feel weak, or have a change in consciousness.  Your hematoma is on your scalp and you have a headache that gets worse or a change in alertness or consciousness. This information is not intended to replace advice given to you by your health care provider. Make sure you discuss any questions you have with your health care provider. Document Released: 08/26/2004 Document Revised: 12/25/2015 Document Reviewed: 12/27/2012 Elsevier Interactive Patient Education  2017 [employer] Inc[employer]  

## 2017-12-19 NOTE — Progress Notes (Signed)
[name] is a 72 y.o. female with the following history as recorded in [employer]:  Patient Active Problem List   Diagnosis Date Noted  . E. coli UTI (urinary tract infection) 12/05/2017  . Chronic midline low back pain 09/22/2017  . Hypokalemia 08/22/2017  . Current severe episode of major depressive disorder with psychotic features ([place]) 08/15/2017  . Simple chronic bronchitis ([place]) 06/28/2017  . Excessive daytime sleepiness 12/21/2016  . Dysphagia 10/13/2016  . Migraine without aura and with status migrainosus, not intractable 03/04/2016  . Left ventricular diastolic dysfunction, NYHA class 2 01/21/2016  . Obesity (BMI 30-39.9) 01/21/2016  . 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 Cervical radiculitis 08/07/2015  . GAD (generalized anxiety disorder) 07/18/2014  . Primary osteoarthritis of both knees 05/23/2014  . Visit for screening mammogram 05/22/2014  . Hyperlipidemia with target LDL less than 100 05/22/2014  . Calculus of bile duct without mention of cholecystitis or obstruction 02/26/2014  . Vitamin D deficiency 12/28/2013  . Estrogen deficiency 12/27/2013  . Routine general medical examination at a health care facility 12/27/2013  . Type II diabetes mellitus with manifestations ([place]) 08/15/2013  . OAB (overactive bladder) 08/15/2013  . Vitamin B12 deficiency anemia 06/05/2010  . HYDROCEPHALUS, NORMAL PRESSURE 06/03/2010  . Essential hypertension 05/15/2008    Current Outpatient Medications  Medication Sig Dispense Refill  . atorvastatin (LIPITOR) 20 MG tablet TAKE 1 TABLET BY MOUTH EVERY DAY 90 tablet 1  . canagliflozin (INVOKANA) 100 MG TABS tablet Take 1 tablet (100 mg total) by mouth daily before breakfast. 90 tablet 0  . cyanocobalamin 2000 MCG tablet Take 1 tablet (2,000 mcg total) by mouth daily. 90 tablet 3  . Dextromethorphan-Guaifenesin (MUCINEX DM) 30-600 MG TB12 Take 1 tablet by mouth 2 (two) times daily as needed. (Patient taking differently:  Take 1 tablet by mouth 2 (two) times daily as needed (congestion). ) 60 tablet 5  . etodolac (LODINE XL) 400 MG 24 hr tablet Take 1 tablet (400 mg total) by mouth daily. 90 tablet 0  . glucose blood test strip Use TID 100 each 11  . Glycopyrrolate (LONHALA MAGNAIR REFILL KIT) 25 MCG/ML SOLN Inhale 1 puff into the lungs 2 (two) times daily. 60 mL 11  . Insulin Pen Needle (NOVOFINE) 32G X 6 MM MISC 1 Act by Does not apply route daily. 100 each 3  . LORazepam (ATIVAN) 1 MG tablet Take 0.5 tablets (0.5 mg total) by mouth every 8 (eight) hours as needed for anxiety. 30 tablet 4  . nebivolol (BYSTOLIC) 10 MG tablet Take 1 tablet (10 mg total) by mouth daily. 126 tablet 0  . pantoprazole (PROTONIX) 40 MG tablet Take 1 tablet (40 mg total) by mouth daily. (Patient taking differently: Take 40 mg by mouth daily as needed (reflux). ) 90 tablet 3  . potassium chloride SA (KLOR-CON M20) 20 MEQ tablet Take 1 tablet (20 mEq total) by mouth 2 (two) times daily. 180 tablet 1  . repaglinide (PRANDIN) 2 MG tablet Take 1 tablet (2 mg total) by mouth 3 (three) times daily before meals. 90 tablet 11  . sertraline (ZOLOFT) 100 MG tablet Take 200 mg by mouth daily.   12  . sitaGLIPtin (JANUVIA) 100 MG tablet Take 1 tablet (100 mg total) by mouth daily. 90 tablet 1  . SUMAtriptan (IMITREX) 50 MG tablet TAKE 1 TABLET BY MOUTH AS NEEDED FOR MIGRAINE, CAN REPEAT IN 2 HOURS IF HEADACHE PERSISTS 10 tablet 5  . telmisartan (MICARDIS) 80 MG tablet  Take 1 tablet (80 mg total) by mouth daily. 90 tablet 3  . tizanidine (ZANAFLEX) 2 MG capsule TAKE ONE CAPSULE 3 TIMES A DAY 270 capsule 1  . traZODone (DESYREL) 50 MG tablet Take 100 mg by mouth at bedtime  3   No current facility-administered medications for this visit.     Allergies: Metformin and related; Ace inhibitors; Erythromycin; Penicillins; and Topamax [topiramate]  Past Medical History:  Diagnosis Date  . Acid reflux disease   . Acute kidney injury ([place])   . Anxiety    . Depression   . Diabetes mellitus without complication ([place])   . Dizziness   . Falls   . Hydrocephalus   . Hypertension   . Hypotension   . MVP (mitral valve prolapse)   . Sinus bradycardia   . Transaminitis     Past Surgical History:  Procedure Laterality Date  . BRAIN SURGERY    . BREAST BIOPSY Left   . CHOLECYSTECTOMY    . ERCP N/A 02/28/2014   Procedure: ENDOSCOPIC RETROGRADE CHOLANGIOPANCREATOGRAPHY (ERCP);  Surgeon: Gatha Mayer, MD;  [facility] END[facility]COPY;  Service: Endoscopy;  Laterality: N/A;  talked to tiffany from or /ebp  . ESOPHAGOGASTRODUODENOSCOPY N/A 02/28/2014   Procedure: ESOPHAGOGASTRODUODENOSCOPY (EGD);  Surgeon: Gatha Mayer, MD;  Location: [facility] ENDOSCOPY;  Service: Endoscopy;  Laterality: N/A;  . VENTRICULOPERITONEAL SHUNT     right occipital    Family History  Problem Relation Age of Onset  . Heart disease Mother   . Stroke Mother   . Hypertension Mother   . Alcohol abuse Father   . Diabetes Father   . Cancer Neg Hx     Social History   Tobacco Use  . Smoking status: Never Smoker  . Smokeless tobacco: Never Used  Substance Use Topics  . Alcohol use: No    Subjective:  Patient presents with extensive bruising over her right arm ( antecubetal) space and up into medial bicep; gave plasma last week- taken from left arm and whole blood was returned into right arm; she remembers telling the phlebotomist that she was having "more pain" than usual during the process; process was actually stopped early due to her discomfort; denies any numbness or tingling down into her fingertips; denies any chest pain or shortness of breath;   Objective:  Vitals:   12/19/17 0822  BP: (!) 142/82  Pulse: 90  Temp: 98 F (36.7 C)  TempSrc: Oral  SpO2: 97%  Weight: 213 lb 0.6 oz (96.6 kg)  Height: '5\' 5"'$  (1.651 m)    General: Well developed, well nourished, in no acute distress  Skin : Warm and dry. Extensive bruising noted over right bicep and antecubetal space;  no warmth or streaking; colors c/w healing bruise;  Head: Normocephalic and atraumatic  Lungs: Respirations unlabored; Musculoskeletal: No deformities; no active joint inflammation  Extremities: No edema, cyanosis, clubbing  Vessels: Symmetric bilaterally  Neurologic: Alert and oriented; speech intact; face symmetrical; moves all extremities well; CNII-XII intact without focal deficit   Assessment:  1. Bruising     Plan:  Reassurance given; do not see any swelling that would be c/w blood clot; discussed use of NSAID but she defers; can try alternating heat/ ice; follow-up worse, no better.    No follow-ups on file.  No orders of the defined types were placed in this encounter.   Requested Prescriptions    No prescriptions requested or ordered in this encounter

## 2017-12-19 NOTE — Telephone Encounter (Signed)
Pt. Reports she donated plasma last Tuesday and Friday she noticed bruising and her right arm began to hurt at the elbow.States "it looks like the bruise is going up my arm." Denies any swelling. Reports can wiggle her fingers, no sensation deficit.States she is "worried she has a blood clot." Appointment made for this morning. Reason for Disposition . Localized pain, redness or hard lump along vein  Answer Assessment - Initial Assessment Questions 1. ONSET: "When did the pain start?"     Started Friday 2. LOCATION: "Where is the pain located?"     In right elbow and bend of arm 3. PAIN: "How bad is the pain?" (Scale 1-10; or mild, moderate, severe)   - MILD (1-3): doesn't interfere with normal activities   - MODERATE (4-7): interferes with normal activities (e.g., work or school) or awakens from sleep   - SEVERE (8-10): excruciating pain, unable to do any normal activities, unable to hold a cup of water     3 4. WORK OR EXERCISE: "Has there been any recent work or exercise that involved this part of the body?"     Gave plasma Tuesday 5. CAUSE: "What do you think is causing the arm pain?"     I gave plasma 6. OTHER SYMPTOMS: "Do you have any other symptoms?" (e.g., neck pain, swelling, rash, fever, numbness, weakness)     Nausea, vomiting and "big conference" 7. PREGNANCY: "Is there any chance you are pregnant?" "When was your last menstrual period?"     No  Protocols used: ARM PAIN-A-AH

## 2018-01-02 ENCOUNTER — Other Ambulatory Visit: Payer: Self-pay | Admitting: Internal Medicine

## 2018-01-02 DIAGNOSIS — M5412 Radiculopathy, cervical region: Secondary | ICD-10-CM

## 2018-01-03 ENCOUNTER — Ambulatory Visit: Payer: Medicare Other | Admitting: Internal Medicine

## 2018-01-03 ENCOUNTER — Encounter: Payer: Self-pay | Admitting: Internal Medicine

## 2018-01-03 ENCOUNTER — Encounter

## 2018-01-03 VITALS — BP 138/84 | HR 68 | Temp 98.2°F | Resp 16 | Ht 65.0 in | Wt 203.0 lb

## 2018-01-03 DIAGNOSIS — I10 Essential (primary) hypertension: Secondary | ICD-10-CM

## 2018-01-03 DIAGNOSIS — E118 Type 2 diabetes mellitus with unspecified complications: Secondary | ICD-10-CM

## 2018-01-03 NOTE — Patient Instructions (Signed)

## 2018-01-03 NOTE — Progress Notes (Signed)
Subjective:  Patient ID: [name], female    DOB: 23-Nov-1945  Age: 72 y.o. MRN: [id number]  CC: Hypertension and Diabetes   HPI [name] presents for f/up - She complains of persistent fatigue, insomnia, and anxiety.  She tells me the combination of trazodone and lorazepam helps her sleep.  She is working on her lifestyle modifications to lose weight.  She tells me her blood pressure has been well controlled and she has had no recent episodes of DOE, CP, palpitations, or edema.  Outpatient Medications Prior to Visit  Medication Sig Dispense Refill  . atorvastatin (LIPITOR) 20 MG tablet TAKE 1 TABLET BY MOUTH EVERY DAY 90 tablet 1  . canagliflozin (INVOKANA) 100 MG TABS tablet Take 1 tablet (100 mg total) by mouth daily before breakfast. 90 tablet 0  . cyanocobalamin 2000 MCG tablet Take 1 tablet (2,000 mcg total) by mouth daily. 90 tablet 3  . Dextromethorphan-Guaifenesin (MUCINEX DM) 30-600 MG TB12 Take 1 tablet by mouth 2 (two) times daily as needed. (Patient taking differently: Take 1 tablet by mouth 2 (two) times daily as needed (congestion). ) 60 tablet 5  . etodolac (LODINE XL) 400 MG 24 hr tablet Take 1 tablet (400 mg total) by mouth daily. 90 tablet 0  . glucose blood test strip Use TID 100 each 11  . Glycopyrrolate (LONHALA MAGNAIR REFILL KIT) 25 MCG/ML SOLN Inhale 1 puff into the lungs 2 (two) times daily. 60 mL 11  . Insulin Pen Needle (NOVOFINE) 32G X 6 MM MISC 1 Act by Does not apply route daily. 100 each 3  . LORazepam (ATIVAN) 1 MG tablet Take 0.5 tablets (0.5 mg total) by mouth every 8 (eight) hours as needed for anxiety. 30 tablet 4  . nebivolol (BYSTOLIC) 10 MG tablet Take 1 tablet (10 mg total) by mouth daily. 126 tablet 0  . pantoprazole (PROTONIX) 40 MG tablet Take 1 tablet (40 mg total) by mouth daily. (Patient taking differently: Take 40 mg by mouth daily as needed (reflux). ) 90 tablet 3  . potassium chloride SA (KLOR-CON M20) 20 MEQ tablet Take 1  tablet (20 mEq total) by mouth 2 (two) times daily. 180 tablet 1  . repaglinide (PRANDIN) 2 MG tablet Take 1 tablet (2 mg total) by mouth 3 (three) times daily before meals. 90 tablet 11  . sertraline (ZOLOFT) 100 MG tablet Take 200 mg by mouth daily.   12  . sitaGLIPtin (JANUVIA) 100 MG tablet Take 1 tablet (100 mg total) by mouth daily. 90 tablet 1  . SUMAtriptan (IMITREX) 50 MG tablet TAKE 1 TABLET BY MOUTH AS NEEDED FOR MIGRAINE, CAN REPEAT IN 2 HOURS IF HEADACHE PERSISTS 10 tablet 5  . telmisartan (MICARDIS) 80 MG tablet Take 1 tablet (80 mg total) by mouth daily. 90 tablet 3  . tizanidine (ZANAFLEX) 2 MG capsule TAKE ONE CAPSULE 3 TIMES A DAY 270 capsule 1  . traZODone (DESYREL) 50 MG tablet Take 100 mg by mouth at bedtime  3   No facility-administered medications prior to visit.     ROS Review of Systems  Constitutional: Positive for fatigue. Negative for appetite change, chills, diaphoresis, fever and unexpected weight change.  HENT: Negative for sore throat and trouble swallowing.   Eyes: Negative for visual disturbance.  Respiratory: Negative for cough, chest tightness, shortness of breath and wheezing.   Cardiovascular: Negative.   Gastrointestinal: Negative for abdominal pain, constipation, diarrhea, nausea and vomiting.  Genitourinary: Negative.  Negative for decreased urine  volume, difficulty urinating, dysuria and urgency.  Musculoskeletal: Negative.  Negative for arthralgias and myalgias.  Skin: Negative.   Neurological: Negative.  Negative for dizziness, weakness and light-headedness.  Hematological: Negative for adenopathy. Does not bruise/bleed easily.  Psychiatric/Behavioral: Positive for sleep disturbance. Negative for confusion, decreased concentration, dysphoric mood and suicidal ideas. The patient is nervous/anxious.     Objective:  BP 138/84 (BP Location: Left Arm, Patient Position: Sitting, Cuff Size: Large)   Pulse 68   Temp 98.2 F (36.8 C) (Oral)   Resp  16   Ht '5\' 5"'$  (1.651 m)   Wt 203 lb (92.1 kg)   SpO2 99%   BMI 33.78 kg/m   BP Readings from Last 3 Encounters:  01/03/18 138/84  12/19/17 (!) 142/82  12/05/17 (!) 160/80    Wt Readings from Last 3 Encounters:  01/03/18 203 lb (92.1 kg)  12/19/17 213 lb 0.6 oz (96.6 kg)  12/05/17 211 lb (95.7 kg)    Physical Exam  Constitutional: She is oriented to person, place, and time. No distress.  HENT:  Mouth/Throat: Oropharynx is clear and moist. No oropharyngeal exudate.  Eyes: Conjunctivae are normal. No scleral icterus.  Neck: Normal range of motion. Neck supple. No JVD present. No thyromegaly present.  Cardiovascular: Normal rate, regular rhythm and normal heart sounds. Exam reveals no gallop.  No murmur heard. Pulmonary/Chest: Effort normal and breath sounds normal. No respiratory distress. She has no wheezes. She has no rales.  Abdominal: Soft. Bowel sounds are normal. She exhibits no mass. There is no hepatosplenomegaly. There is no tenderness.  Musculoskeletal: Normal range of motion. She exhibits no edema, tenderness or deformity.  Lymphadenopathy:    She has no cervical adenopathy.  Neurological: She is alert and oriented to person, place, and time.  Skin: Skin is warm and dry. She is not diaphoretic.  Psychiatric: She has a normal mood and affect. Her behavior is normal. Judgment and thought content normal.  Vitals reviewed.   Lab Results  Component Value Date   WBC 4.5 11/29/2017   HGB 12.3 11/29/2017   HCT 36.5 11/29/2017   PLT 177 11/29/2017   GLUCOSE 102 (H) 12/06/2017   CHOL 186 05/31/2017   TRIG 230.0 (H) 05/31/2017   HDL 50.00 05/31/2017   LDLDIRECT 106.0 05/31/2017   LDLCALC 124 (H) 05/11/2016   ALT 11 (L) 11/29/2017   AST 13 (L) 11/29/2017   NA 143 12/06/2017   K 3.3 (L) 12/06/2017   CL 109 12/06/2017   CREATININE 0.73 12/06/2017   BUN 11 12/06/2017   CO2 24 12/06/2017   TSH 0.82 08/15/2017   INR 1.33 07/25/2014   HGBA1C 6.9 10/01/2017    MICROALBUR 3.3 (H) 05/31/2017    Dg Chest Portable 1 View  Result Date: 11/28/2017 CLINICAL DATA:  Hypotension EXAM: PORTABLE CHEST 1 VIEW COMPARISON:  08/15/2017 FINDINGS: Emphysematous hyperinflation of lungs. Top-normal size heart with minimal aortic atherosclerosis. No aneurysm. No acute pulmonary consolidation, effusion or pneumothorax. No suspicious osseous abnormality. IMPRESSION: Hyperinflated lungs without active pulmonary disease. Electronically Signed   By: Ashley Royalty M.D.   On: 11/28/2017 03:57    Assessment & Plan:   Shaquera was seen today for hypertension and diabetes.  Diagnoses and all orders for this visit:  Type 2 diabetes mellitus with complication, without long-term current use of insulin ([place])- Her blood sugars have been well controlled.  Essential hypertension- Her blood pressure is well controlled.  Recent electrolytes and renal function were normal.  Morbid obesity (HCC)-he has  lost 10 pounds since her last visit.  She was praised for her success with lifestyle modifications.   I am having [name] maintain her traZODone, cyanocobalamin, sertraline, glucose blood, Insulin Pen Needle, repaglinide, SUMAtriptan, MUCINEX DM, Glycopyrrolate, etodolac, sitaGLIPtin, pantoprazole, atorvastatin, nebivolol, telmisartan, LORazepam, canagliflozin, potassium chloride SA, and tizanidine.  No orders of the defined types were placed in this encounter.    Follow-up: Return in about 3 months (around 04/05/2018).  [name], MD

## 2018-01-04 ENCOUNTER — Telehealth: Payer: Self-pay
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
## 2018-01-07 ENCOUNTER — Other Ambulatory Visit: Payer: Self-pay | Admitting: Internal Medicine

## 2018-01-07 ENCOUNTER — Other Ambulatory Visit: Payer: Self-pay | Admitting: Endocrinology

## 2018-01-07 DIAGNOSIS — E118 Type 2 diabetes mellitus with unspecified complications: Secondary | ICD-10-CM

## 2018-01-08 NOTE — Telephone Encounter (Signed)
Please refill x 3 mos Ov is due 

## 2018-01-12 ENCOUNTER — Encounter: Payer: Self-pay | Admitting: Internal Medicine

## 2018-01-12 ENCOUNTER — Ambulatory Visit: Payer: Medicare Other | Admitting: Internal Medicine

## 2018-01-12 DIAGNOSIS — R200 Anesthesia of skin: Secondary | ICD-10-CM | POA: Insufficient documentation

## 2018-01-12 DIAGNOSIS — E118 Type 2 diabetes mellitus with unspecified complications: Secondary | ICD-10-CM | POA: Diagnosis not present

## 2018-01-12 LAB — POCT GLYCOSYLATED HEMOGLOBIN (HGB A1C): Hemoglobin A1C: 6 % — AB (ref 4.0–5.6)

## 2018-01-12 NOTE — Telephone Encounter (Addendum)
Adding information to this:   Pt states that she signed a HCPOA to her friend: Narda Bonds at [place] and she confirmed that she does have HCPOA over pt that pt was to bring [place] a copy. Informed Collie Siad that we did not receive one today.   Contacted Marlon Pel (pt attorney at [phone] -cell) and informed that we have completed a letter and told her over the phone what the letter states. Malachy Mood requested that the letter be mailed to [address] [place].   Letter completed, printed and signed.

## 2018-01-12 NOTE — Patient Instructions (Signed)

## 2018-01-12 NOTE — Progress Notes (Signed)
Subjective:  Patient ID: [name], female    DOB: September 09, 1945  Age: 72 y.o. MRN: [id number]  CC: Hypertension and Diabetes   HPI [name] presents for f/up - She complains of a 3-week history of intermittent numbness in her left hand.  She is right-handed, has a history of cervical radiculitis, and NPH.  She denies any recent episodes of headache, neck pain, or other paresthesias elsewhere.  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  Outpatient Medications Prior to Visit  Medication Sig Dispense Refill  . atorvastatin (LIPITOR) 20 MG tablet TAKE 1 TABLET BY MOUTH EVERY DAY 90 tablet 1  . cyanocobalamin 2000 MCG tablet Take 1 tablet (2,000 mcg total) by mouth daily. 90 tablet 3  . Dextromethorphan-Guaifenesin (MUCINEX DM) 30-600 MG TB12 Take 1 tablet by mouth 2 (two) times daily as needed. (Patient taking differently: Take 1 tablet by mouth 2 (two) times daily as needed (congestion). ) 60 tablet 5  . etodolac (LODINE XL) 400 MG 24 hr tablet Take 1 tablet (400 mg total) by mouth daily. 90 tablet 0  . glucose blood test strip Use TID 100 each 11  . Glycopyrrolate (LONHALA MAGNAIR REFILL KIT) 25 MCG/ML SOLN Inhale 1 puff into the lungs 2 (two) times daily. 60 mL 11  . Insulin Pen Needle (NOVOFINE) 32G X 6 MM MISC 1 Act by Does not apply route daily. 100 each 3  . JANUVIA 100 MG tablet TAKE 1 TABLET (100 MG TOTAL) BY MOUTH DAILY. 90 tablet 1  . LORazepam (ATIVAN) 1 MG tablet Take 0.5 tablets (0.5 mg total) by mouth every 8 (eight) hours as needed for anxiety. 30 tablet 4  . nebivolol (BYSTOLIC) 10 MG tablet Take 1 tablet (10 mg total) by mouth daily. 126 tablet 0  . pantoprazole (PROTONIX) 40 MG tablet Take 1 tablet (40 mg total) by mouth daily. (Patient taking differently: Take 40 mg by mouth daily as needed (reflux). ) 90 tablet 3  . potassium chloride SA (KLOR-CON M20) 20 MEQ tablet Take 1 tablet (20 mEq total) by mouth 2 (two) times daily. 180 tablet 1  .  repaglinide (PRANDIN) 2 MG tablet TAKE 1 TABLET (2 MG TOTAL) BY MOUTH 3 (THREE) TIMES DAILY BEFORE MEALS. 90 tablet 9  . sertraline (ZOLOFT) 100 MG tablet Take 200 mg by mouth daily.   12  . SUMAtriptan (IMITREX) 50 MG tablet TAKE 1 TABLET BY MOUTH AS NEEDED FOR MIGRAINE, CAN REPEAT IN 2 HOURS IF HEADACHE PERSISTS 10 tablet 5  . telmisartan (MICARDIS) 80 MG tablet Take 1 tablet (80 mg total) by mouth daily. 90 tablet 3  . tizanidine (ZANAFLEX) 2 MG capsule TAKE ONE CAPSULE 3 TIMES A DAY 270 capsule 1  . traZODone (DESYREL) 50 MG tablet Take 100 mg by mouth at bedtime  3  . canagliflozin (INVOKANA) 100 MG TABS tablet Take 1 tablet (100 mg total) by mouth daily before breakfast. 90 tablet 0  . sitaGLIPtin (JANUVIA) 100 MG tablet Take 1 tablet (100 mg total) by mouth daily. 90 tablet 1   No facility-administered medications prior to visit.     ROS Review of Systems  Constitutional: Negative.  Negative for diaphoresis and fatigue.  HENT: Negative.  Negative for sinus pressure and trouble swallowing.   Eyes: Negative for visual disturbance.  Respiratory: Negative for cough, chest tightness, shortness of breath and wheezing.   Cardiovascular: Negative for chest pain, palpitations and leg swelling.  Endocrine: Negative.   Genitourinary: Negative.  Negative for difficulty urinating.  Musculoskeletal: Negative.  Negative for back pain and neck pain.  Skin: Negative for color change, pallor and rash.  Allergic/Immunologic: Negative.   Neurological: Positive for numbness. Negative for dizziness and weakness.  Hematological: Negative for adenopathy. Does not bruise/bleed easily.  Psychiatric/Behavioral: Negative.     Objective:  BP 124/64 (BP Location: Left Arm, Patient Position: Sitting, Cuff Size: Large)   Pulse 67   Temp 98 F (36.7 C) (Oral)   Resp 16   Ht _0  (1.651 m)   Wt 205 lb (93 kg)   SpO2 96%   BMI 34.11 kg/m   BP Readings from Last 3 Encounters:  01/12/18 124/64    01/03/18 138/84  12/19/17 (!) 142/82    Wt Readings from Last 3 Encounters:  01/12/18 205 lb (93 kg)  01/03/18 203 lb (92.1 kg)  12/19/17 213 lb 0.6 oz (96.6 kg)    Physical Exam  Constitutional: She is oriented to person, place, and time. No distress.  HENT:  Mouth/Throat: Oropharynx is clear and moist. No oropharyngeal exudate.  Eyes: Conjunctivae are normal. No scleral icterus.  Neck: Normal range of motion. Neck supple. No JVD present. No thyromegaly present.  Cardiovascular: Normal rate, regular rhythm and normal heart sounds.  No murmur heard. Pulmonary/Chest: Effort normal and breath sounds normal. No respiratory distress. She has no wheezes. She has no rales.  Abdominal: Soft. Bowel sounds are normal. She exhibits no mass. There is no tenderness.  Musculoskeletal: Normal range of motion. She exhibits no edema, tenderness or deformity.  Lymphadenopathy:    She has no cervical adenopathy.  Neurological: She is alert and oriented to person, place, and time. She has normal strength and normal reflexes. She displays no atrophy and normal reflexes. No sensory deficit. She exhibits normal muscle tone. She displays no seizure activity. Coordination and gait abnormal.  Skin: Skin is warm and dry. She is not diaphoretic. No pallor.  Vitals reviewed.   Lab Results  Component Value Date   WBC 4.5 11/29/2017   HGB 12.3 11/29/2017   HCT 36.5 11/29/2017   PLT 177 11/29/2017   GLUCOSE 102 (H) 12/06/2017   CHOL 186 05/31/2017   TRIG 230.0 (H) 05/31/2017   HDL 50.00 05/31/2017   LDLDIRECT 106.0 05/31/2017   LDLCALC 124 (H) 05/11/2016   ALT 11 (L) 11/29/2017   AST 13 (L) 11/29/2017   NA 143 12/06/2017   K 3.3 (L) 12/06/2017   CL 109 12/06/2017   CREATININE 0.73 12/06/2017   BUN 11 12/06/2017   CO2 24 12/06/2017   TSH 0.82 08/15/2017   INR 1.33 07/25/2014   HGBA1C 6.0 (A) 01/12/2018   MICROALBUR 3.3 (H) 05/31/2017    Dg Chest Portable 1 View  Result Date:  11/28/2017 CLINICAL DATA:  Hypotension EXAM: PORTABLE CHEST 1 VIEW COMPARISON:  08/15/2017 FINDINGS: Emphysematous hyperinflation of lungs. Top-normal size heart with minimal aortic atherosclerosis. No aneurysm. No acute pulmonary consolidation, effusion or pneumothorax. No suspicious osseous abnormality. IMPRESSION: Hyperinflated lungs without active pulmonary disease. Electronically Signed   By: Ashley Royalty M.D.   On: 11/28/2017 03:57    Assessment & Plan:   Nefertari was seen today for hypertension and diabetes.  Diagnoses and all orders for this visit:  Numbness of left hand- She is neurologically intact on examination.  I have asked her to undergo a NCS/EMG to try to identify where her symptoms are coming from. -     Ambulatory referral to Neurology  Type 2 diabetes  mellitus with complication, without long-term current use of insulin ([place])- Her A1c is down to 6%.  Her blood sugars are over controlled so I have asked her to stop taking the SGLT2 inhibitor.  She will continue taking the DPP 4 inhibitor. -     POCT glycosylated hemoglobin (Hb A1C)   I have discontinued Karen Chafe. [place]'s sitaGLIPtin and canagliflozin. I am also having her maintain her traZODone, cyanocobalamin, sertraline, glucose blood, Insulin Pen Needle, SUMAtriptan, MUCINEX DM, Glycopyrrolate, etodolac, pantoprazole, atorvastatin, nebivolol, telmisartan, LORazepam, potassium chloride SA, tizanidine, repaglinide, and JANUVIA.  No orders of the defined types were placed in this encounter.    Follow-up: Return in about 4 months (around 05/14/2018).  [name], MD

## 2018-01-17 ENCOUNTER — Encounter: Payer: Self-pay | Admitting: Internal Medicine

## 2018-01-17 ENCOUNTER — Other Ambulatory Visit: Payer: Medicare Other

## 2018-01-17 ENCOUNTER — Ambulatory Visit (INDEPENDENT_AMBULATORY_CARE_PROVIDER_SITE_OTHER): Payer: Medicare Other | Admitting: Internal Medicine

## 2018-01-17 VITALS — BP 160/80 | HR 70 | Temp 99.1°F | Resp 16 | Ht 65.0 in | Wt 205.0 lb

## 2018-01-17 DIAGNOSIS — R300 Dysuria: Secondary | ICD-10-CM | POA: Diagnosis not present

## 2018-01-17 DIAGNOSIS — N390 Urinary tract infection, site not specified: Secondary | ICD-10-CM | POA: Insufficient documentation

## 2018-01-17 DIAGNOSIS — N3000 Acute cystitis without hematuria: Secondary | ICD-10-CM

## 2018-01-17 DIAGNOSIS — B961 Klebsiella pneumoniae [K. pneumoniae] as the cause of diseases classified elsewhere: Secondary | ICD-10-CM

## 2018-01-17 DIAGNOSIS — B9689 Other specified bacterial agents as the cause of diseases classified elsewhere: Secondary | ICD-10-CM | POA: Insufficient documentation

## 2018-01-17 LAB — POCT URINALYSIS DIPSTICK
Bilirubin, UA: NEGATIVE
Blood, UA: NEGATIVE
Glucose, UA: NEGATIVE
Ketones, UA: NEGATIVE
Nitrite, UA: NEGATIVE
Protein, UA: NEGATIVE
Spec Grav, UA: 1.015 (ref 1.010–1.025)
Urobilinogen, UA: 0.2 E.U./dL
pH, UA: 6 (ref 5.0–8.0)

## 2018-01-17 MED ORDER — NITROFURANTOIN MONOHYD MACRO 100 MG PO CAPS: 100.0000 mg | ORAL_CAPSULE | Freq: Two times a day (BID) | ORAL | 0 refills | Status: AC

## 2018-01-17 NOTE — Assessment & Plan Note (Signed)
See above

## 2018-01-17 NOTE — Progress Notes (Signed)
Subjective:  Patient ID: [name], female    DOB: 1945/10/18  Age: 72 y.o. MRN: [id number]  CC: Urinary Tract Infection   HPI [name] presents for a 4-day history of dysuria, urgency, and frequency.  Outpatient Medications Prior to Visit  Medication Sig Dispense Refill  . atorvastatin (LIPITOR) 20 MG tablet TAKE 1 TABLET BY MOUTH EVERY DAY 90 tablet 1  . cyanocobalamin 2000 MCG tablet Take 1 tablet (2,000 mcg total) by mouth daily. 90 tablet 3  . Dextromethorphan-Guaifenesin (MUCINEX DM) 30-600 MG TB12 Take 1 tablet by mouth 2 (two) times daily as needed. (Patient taking differently: Take 1 tablet by mouth 2 (two) times daily as needed (congestion). ) 60 tablet 5  . etodolac (LODINE XL) 400 MG 24 hr tablet Take 1 tablet (400 mg total) by mouth daily. 90 tablet 0  . glucose blood test strip Use TID 100 each 11  . Glycopyrrolate (LONHALA MAGNAIR REFILL KIT) 25 MCG/ML SOLN Inhale 1 puff into the lungs 2 (two) times daily. 60 mL 11  . Insulin Pen Needle (NOVOFINE) 32G X 6 MM MISC 1 Act by Does not apply route daily. 100 each 3  . JANUVIA 100 MG tablet TAKE 1 TABLET (100 MG TOTAL) BY MOUTH DAILY. 90 tablet 1  . LORazepam (ATIVAN) 1 MG tablet Take 0.5 tablets (0.5 mg total) by mouth every 8 (eight) hours as needed for anxiety. 30 tablet 4  . nebivolol (BYSTOLIC) 10 MG tablet Take 1 tablet (10 mg total) by mouth daily. 126 tablet 0  . pantoprazole (PROTONIX) 40 MG tablet Take 1 tablet (40 mg total) by mouth daily. (Patient taking differently: Take 40 mg by mouth daily as needed (reflux). ) 90 tablet 3  . potassium chloride SA (KLOR-CON M20) 20 MEQ tablet Take 1 tablet (20 mEq total) by mouth 2 (two) times daily. 180 tablet 1  . repaglinide (PRANDIN) 2 MG tablet TAKE 1 TABLET (2 MG TOTAL) BY MOUTH 3 (THREE) TIMES DAILY BEFORE MEALS. 90 tablet 9  . sertraline (ZOLOFT) 100 MG tablet Take 200 mg by mouth daily.   12  . SUMAtriptan (IMITREX) 50 MG tablet TAKE 1 TABLET BY MOUTH AS  NEEDED FOR MIGRAINE, CAN REPEAT IN 2 HOURS IF HEADACHE PERSISTS 10 tablet 5  . telmisartan (MICARDIS) 80 MG tablet Take 1 tablet (80 mg total) by mouth daily. 90 tablet 3  . tizanidine (ZANAFLEX) 2 MG capsule TAKE ONE CAPSULE 3 TIMES A DAY 270 capsule 1  . traZODone (DESYREL) 50 MG tablet Take 100 mg by mouth at bedtime  3   No facility-administered medications prior to visit.     ROS Review of Systems  Constitutional: Negative for chills, diaphoresis, fatigue and fever.  HENT: Negative.   Eyes: Negative for visual disturbance.  Respiratory: Negative.  Negative for cough, chest tightness and shortness of breath.   Cardiovascular: Negative for chest pain, palpitations and leg swelling.  Gastrointestinal: Negative for abdominal pain, diarrhea, nausea and vomiting.  Endocrine: Positive for polyuria. Negative for polydipsia and polyphagia.  Genitourinary: Positive for dysuria, frequency and urgency. Negative for decreased urine volume, difficulty urinating, flank pain, hematuria, pelvic pain and vaginal pain.  Musculoskeletal: Negative.   Skin: Negative.   Neurological: Negative.  Negative for dizziness, weakness and headaches.  Hematological: Negative for adenopathy. Does not bruise/bleed easily.  Psychiatric/Behavioral: Negative.     Objective:  BP (!) 160/80 (BP Location: Left Arm, Patient Position: Sitting, Cuff Size: Large)   Pulse 70  Temp 99.1 F (37.3 C) (Oral)   Resp 16   Ht '5\' 5"'$  (1.651 m)   Wt 205 lb (93 kg)   SpO2 97%   BMI 34.11 kg/m   BP Readings from Last 3 Encounters:  01/17/18 (!) 160/80  01/12/18 124/64  01/03/18 138/84    Wt Readings from Last 3 Encounters:  01/17/18 205 lb (93 kg)  01/12/18 205 lb (93 kg)  01/03/18 203 lb (92.1 kg)    Physical Exam  Constitutional: She is oriented to person, place, and time. No distress.  HENT:  Mouth/Throat: Oropharynx is clear and moist. No oropharyngeal exudate.  Eyes: Conjunctivae are normal. No scleral  icterus.  Neck: Normal range of motion. Neck supple. No JVD present. No thyromegaly present.  Cardiovascular: Normal rate, regular rhythm and normal heart sounds.  No murmur heard. Pulmonary/Chest: Effort normal and breath sounds normal. She has no wheezes. She has no rales.  Abdominal: Soft. Normal appearance and bowel sounds are normal. She exhibits no mass. There is no hepatosplenomegaly. There is no tenderness.  Musculoskeletal: Normal range of motion. She exhibits no edema, tenderness or deformity.  Lymphadenopathy:    She has no cervical adenopathy.  Neurological: She is alert and oriented to person, place, and time.  Skin: Skin is warm and dry. She is not diaphoretic.  Vitals reviewed.   Lab Results  Component Value Date   WBC 4.5 11/29/2017   HGB 12.3 11/29/2017   HCT 36.5 11/29/2017   PLT 177 11/29/2017   GLUCOSE 102 (H) 12/06/2017   CHOL 186 05/31/2017   TRIG 230.0 (H) 05/31/2017   HDL 50.00 05/31/2017   LDLDIRECT 106.0 05/31/2017   LDLCALC 124 (H) 05/11/2016   ALT 11 (L) 11/29/2017   AST 13 (L) 11/29/2017   NA 143 12/06/2017   K 3.3 (L) 12/06/2017   CL 109 12/06/2017   CREATININE 0.73 12/06/2017   BUN 11 12/06/2017   CO2 24 12/06/2017   TSH 0.82 08/15/2017   INR 1.33 07/25/2014   HGBA1C 6.0 (A) 01/12/2018   MICROALBUR 3.3 (H) 05/31/2017   No results found for this or any previous visit (from the past 1 hour(s)).   Dg Chest Portable 1 View  Result Date: 11/28/2017 CLINICAL DATA:  Hypotension EXAM: PORTABLE CHEST 1 VIEW COMPARISON:  08/15/2017 FINDINGS: Emphysematous hyperinflation of lungs. Top-normal size heart with minimal aortic atherosclerosis. No aneurysm. No acute pulmonary consolidation, effusion or pneumothorax. No suspicious osseous abnormality. IMPRESSION: Hyperinflated lungs without active pulmonary disease. Electronically Signed   By: Ashley Royalty M.D.   On: 11/28/2017 03:57    Assessment & Plan:    I am having [name] start on  nitrofurantoin (macrocrystal-monohydrate). I am also having her maintain her traZODone, cyanocobalamin, sertraline, glucose blood, Insulin Pen Needle, SUMAtriptan, MUCINEX DM, Glycopyrrolate, etodolac, pantoprazole, atorvastatin, nebivolol, telmisartan, LORazepam, potassium chloride SA, tizanidine, repaglinide, and JANUVIA.  Meds ordered this encounter  Medications  . nitrofurantoin, macrocrystal-monohydrate, (MACROBID) 100 MG capsule    Sig: Take 1 capsule (100 mg total) by mouth 2 (two) times daily for 7 days.    Dispense:  14 capsule    Refill:  0     Follow-up: Return in about 1 month (around 02/14/2018).  [name], MD

## 2018-01-17 NOTE — Assessment & Plan Note (Signed)
She is symptomatic and has white cells in your urine. Urine culture is pending at this time. I will empirically treat with nitrofurantoin.

## 2018-01-17 NOTE — Patient Instructions (Signed)

## 2018-01-19 LAB — CULTURE, URINE COMPREHENSIVE
MICRO NUMBER:: 90730022
SPECIMEN QUALITY:: ADEQUATE

## 2018-01-25 ENCOUNTER — Other Ambulatory Visit: Payer: Self-pay | Admitting: Internal Medicine

## 2018-01-25 DIAGNOSIS — B9689 Other specified bacterial agents as the cause of diseases classified elsewhere: Secondary | ICD-10-CM

## 2018-01-25 DIAGNOSIS — N390 Urinary tract infection, site not specified: Secondary | ICD-10-CM

## 2018-01-25 DIAGNOSIS — B961 Klebsiella pneumoniae [K. pneumoniae] as the cause of diseases classified elsewhere: Principal | ICD-10-CM

## 2018-01-25 MED ORDER — CIPROFLOXACIN HCL 250 MG PO TABS: 250.0000 mg | ORAL_TABLET | Freq: Two times a day (BID) | ORAL | 0 refills | Status: AC

## 2018-01-29 ENCOUNTER — Other Ambulatory Visit: Payer: Self-pay | Admitting: Internal Medicine

## 2018-01-29 DIAGNOSIS — N3000 Acute cystitis without hematuria: Secondary | ICD-10-CM

## 2018-02-06 ENCOUNTER — Other Ambulatory Visit: Payer: Self-pay | Admitting: Internal Medicine

## 2018-02-06 DIAGNOSIS — F323 Major depressive disorder, single episode, severe with psychotic features: Secondary | ICD-10-CM

## 2018-02-07 ENCOUNTER — Encounter: Payer: Self-pay | Admitting: Internal Medicine

## 2018-02-07 ENCOUNTER — Ambulatory Visit (INDEPENDENT_AMBULATORY_CARE_PROVIDER_SITE_OTHER): Payer: Medicare Other | Admitting: Internal Medicine

## 2018-02-07 ENCOUNTER — Other Ambulatory Visit (INDEPENDENT_AMBULATORY_CARE_PROVIDER_SITE_OTHER): Payer: Medicare Other

## 2018-02-07 VITALS — BP 140/80 | HR 72 | Temp 99.0°F | Ht 65.0 in | Wt 205.5 lb

## 2018-02-07 DIAGNOSIS — T310 Burns involving less than 10% of body surface: Secondary | ICD-10-CM

## 2018-02-07 DIAGNOSIS — E876 Hypokalemia: Secondary | ICD-10-CM | POA: Diagnosis not present

## 2018-02-07 DIAGNOSIS — X12XXXA Contact with other hot fluids, initial encounter: Secondary | ICD-10-CM

## 2018-02-07 DIAGNOSIS — Z23 Encounter for immunization: Secondary | ICD-10-CM | POA: Diagnosis not present

## 2018-02-07 DIAGNOSIS — I10 Essential (primary) hypertension: Secondary | ICD-10-CM

## 2018-02-07 LAB — BASIC METABOLIC PANEL
BUN: 16 mg/dL (ref 6–23)
CO2: 31 mEq/L (ref 19–32)
Calcium: 9.7 mg/dL (ref 8.4–10.5)
Chloride: 103 mEq/L (ref 96–112)
Creatinine, Ser: 0.79 mg/dL (ref 0.40–1.20)
GFR: 76.25 mL/min (ref >60.00)
Glucose, Bld: 144 mg/dL — ABNORMAL HIGH (ref 70–99)
Potassium: 3.9 mEq/L (ref 3.5–5.1)
Sodium: 140 mEq/L (ref 135–145)

## 2018-02-07 LAB — MAGNESIUM: Magnesium: 2 mg/dL (ref 1.5–2.5)
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
## 2018-02-07 NOTE — Patient Instructions (Signed)

## 2018-02-10 ENCOUNTER — Other Ambulatory Visit: Payer: Self-pay | Admitting: Internal Medicine

## 2018-02-10 DIAGNOSIS — G43001 Migraine without aura, not intractable, with status migrainosus: Secondary | ICD-10-CM

## 2018-02-14 ENCOUNTER — Telehealth: Payer: Self-pay | Admitting: *Deleted

## 2018-02-14 NOTE — Telephone Encounter (Signed)
appt made

## 2018-02-14 NOTE — Telephone Encounter (Signed)
You can work her in to see me
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
## 2018-02-15 ENCOUNTER — Ambulatory Visit: Payer: Medicare Other | Admitting: Internal Medicine

## 2018-02-15 ENCOUNTER — Encounter: Payer: Self-pay | Admitting: Internal Medicine

## 2018-02-15 VITALS — BP 142/88 | HR 74 | Temp 99.0°F | Resp 16 | Ht 65.0 in | Wt 209.0 lb

## 2018-02-15 DIAGNOSIS — T148XXA Other injury of unspecified body region, initial encounter: Secondary | ICD-10-CM

## 2018-02-15 DIAGNOSIS — L089 Local infection of the skin and subcutaneous tissue, unspecified: Secondary | ICD-10-CM | POA: Insufficient documentation

## 2018-02-15 MED ORDER — MUPIROCIN 2 % EX OINT
1.0000 | TOPICAL_OINTMENT | Freq: Two times a day (BID) | CUTANEOUS | 0 refills | Status: DC
Start: 2018-02-15 — End: 2018-02-15

## 2018-02-15 MED ORDER — MUPIROCIN 2 % EX OINT: TOPICAL_OINTMENT | CUTANEOUS | 0 refills | Status: DC

## 2018-02-15 NOTE — Patient Instructions (Signed)
Wound Infection A wound infection happens when germs start to grow in the wound. Germs that cause wound infections are most commonly bacteria. Other types of infections can occur as well. In some cases, infection can cause the wound to break open. Wound infections need treatment. If a wound infection is left untreated, complications can occur. This may include an infection in your bloodstream (sepsis) or in a bone (osteomyelitis). What are the causes? This condition is caused by germs growing in the wound. What increases the risk? The following factors may make you more likely to develop this condition:  Having a weak body defense system (immune system).  Having diabetes.  Taking steroid medicines for a long time (chronic use).  Smoking.  Older age.  Being overweight.  What are the signs or symptoms? Symptoms of this condition include:  Having more redness, swelling, or pain at the wound site.  Having more blood, pus, or fluid at the wound site.  A bad smell coming from a wound or bandage (dressing).  Having a fever.  Feeling tired or fatigued.  How is this diagnosed? This condition is diagnosed with a medical history and physical exam. You may also have blood tests. How is this treated? This condition is treated with an antibiotic medicine. The infection should improve 24-48 hours after you start antibiotics. Any redness around the wound should stop spreading, and the wound should be less painful. Follow these instructions at home: Medicines  Take or apply over-the-counter and prescription medicines only as told by your health care provider.  If you were prescribed antibiotic medicine, take or apply it as told by your health care provider. Do not stop using the antibiotic even if your condition improves. Wound care  Clean the wound each day or as told by your health care provider. ? Wash the wound with mild soap and water. ? Rinse the wound with water to remove all  soap. ? Pat the wound dry with a clean towel. Do not rub it.  Follow instructions from your health care provider about how to take care of your wound. Make sure you: ? Wash your hands with soap and water before you change your dressing. If soap and water are not available, use hand sanitizer. ? Change your dressing as told by your health care provider. ? Leave stitches (sutures), skin glue, or adhesive strips in place, if this applies. These skin closures may need to stay in place for 2 weeks or longer. If adhesive strip edges start to loosen and curl up, you may trim the loose edges. Do not remove adhesive strips completely unless your health care provider tells you to do that. Some wounds are left open to heal on their own.  Check your wound every day for signs of infection. Watch for: ? More redness, swelling, or pain. ? More fluid or blood. ? Warmth. ? Pus or a bad smell. General instructions  Keep the dressing dry until your health care provider says it can be removed.  Do not take baths, swim, use a hot tub, or do anything that would put your wound underwater until your health care provider approves.  Raise (elevate) the injured area above the level of your heart while you are sitting or lying down.  Do not scratch or pick at the wound.  Keep all follow-up visits as told by your health care provider. This is important. Contact a health care provider if:  Your pain is not controlled with medicine.  You have more   redness, swelling, or pain around your wound.  You have more fluid or blood coming from your wound.  Your wound feels warm to the touch.  You have pus coming from your wound.  You continue to notice a bad smell coming from your wound or your dressing.  Your wound that was closed breaks open. Get help right away if:  You have a red streak going away from your wound.  You have a fever. This information is not intended to replace advice given to you by your  health care provider. Make sure you discuss any questions you have with your health care provider. Document Released: 04/17/2003 Document Revised: 12/31/2015 Document Reviewed: 01/06/2015 Elsevier Interactive Patient Education  2018 [employer] Inc[employer]  
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
## 2018-03-02 ENCOUNTER — Encounter: Payer: Self-pay | Admitting: Diagnostic Neuroimaging

## 2018-03-02 ENCOUNTER — Ambulatory Visit (INDEPENDENT_AMBULATORY_CARE_PROVIDER_SITE_OTHER): Payer: Medicare Other | Admitting: Diagnostic Neuroimaging

## 2018-03-02 DIAGNOSIS — R200 Anesthesia of skin: Secondary | ICD-10-CM | POA: Diagnosis not present

## 2018-03-02 DIAGNOSIS — Z0289 Encounter for other administrative examinations: Secondary | ICD-10-CM

## 2018-03-03 NOTE — Procedures (Signed)
[facility]  NCS (NERVE CONDUCTION STUDY) WITH EMG (ELECTROMYOGRAPHY) REPORT   STUDY DATE: 03/02/18 PATIENT NAME: [name] DOB: 1946-07-29 MRN: [id number]  ORDERING CLINICIAN: [name], MD  TECHNOLOGIST: Oneita Jolly ELECTROMYOGRAPHER: Earlean Polka. Timm Bonenberger, MD  CLINICAL INFORMATION: 72 year old female with left hand numbness.  FINDINGS: NERVE CONDUCTION STUDY: Left median motor response is prolonged distal latency (5.5 ms) normal amplitude, normal conduction velocity.  Right median motor response is normal.  Bilateral ulnar motor responses have normal distal latencies, slightly decreased amplitudes, normal conduction velocities.  Bilateral ulnar F-wave latencies are normal.  Bilateral radial and ulnar sensory responses are normal.  Bilateral median sensory responses have prolonged peak latencies and decreased amplitudes.   NEEDLE ELECTROMYOGRAPHY:  Needle examination of left upper externally is normal.   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 - Mild decreased amplitudes of bilateral ulnar motor responses may reflect underlying axonal neuropathy vs technical artifact.    INTERPRETING PHYSICIAN:  Penni Bombard, MD Certified in Neurology, Neurophysiology and Neuroimaging  [facility] [address] [place], [place] [address] [phone]   [facility]    Nerve / Sites Muscle Latency Ref. Amplitude Ref. Rel Amp Segments Distance Velocity Ref. Area    ms ms mV mV %  cm m/s m/s mVms  L Median - APB     Wrist APB 5.5 ?4.4 4.9 ?4.0 100 Wrist - APB 7   22.0     Upper arm APB 9.3  4.7  96.4 Upper arm - Wrist 20 53 ?49 21.7  R Median - APB     Wrist APB 3.2 ?4.4 6.4 ?4.0 100 Wrist - APB 7   22.3     Upper arm APB 6.8  6.1  94.4 Upper arm - Wrist 20 56 ?49 22.1  L Ulnar - ADM     Wrist ADM 2.7 ?3.3 4.2 ?6.0 100 Wrist  - ADM 7   13.7     B.Elbow ADM 6.4  4.0  96.1 B.Elbow - Wrist 20 54 ?49 12.8     A.Elbow ADM 8.3  4.1  100 A.Elbow - B.Elbow 10 52 ?49 12.9         A.Elbow - Wrist      R Ulnar - ADM     Wrist ADM 2.5 ?3.3 3.8 ?6.0 100 Wrist - ADM 7   11.3     B.Elbow ADM 5.9  3.8  100 B.Elbow - Wrist 20 58 ?49 11.2     A.Elbow ADM 7.9  3.4  88.5 A.Elbow - B.Elbow 10 52 ?49 11.0         A.Elbow - Wrist                 SNC    Nerve / Sites Rec. Site Peak Lat Ref.  Amp Ref. Segments Distance    ms ms V V  cm  L Radial - Anatomical snuff box (Forearm)     Forearm Wrist 2.2 ?2.9 11 ?15 Forearm - Wrist 10  R Radial - Anatomical snuff box (Forearm)     Forearm Wrist 2.4 ?2.9 16 ?15 Forearm - Wrist 10  L Median - Orthodromic (Dig II, Mid palm)     Dig II Wrist 4.8 ?3.4 3 ?10 Dig II - Wrist 13  R Median - Orthodromic (Dig II, Mid palm)     Dig II Wrist 3.4 ?3.4 6 ?  10 Dig II - Wrist 13  L Ulnar - Orthodromic, (Dig V, Mid palm)     Dig V Wrist 2.6 ?3.1 6 ?5 Dig V - Wrist 11  R Ulnar - Orthodromic, (Dig V, Mid palm)     Dig V Wrist 2.4 ?3.1 6 ?5 Dig V - Wrist 8                 F  Wave    Nerve F Lat Ref.   ms ms  L Ulnar - ADM 27.1 ?32.0  R Ulnar - ADM 28.0 ?32.0         EMG full       EMG Summary Table    Spontaneous MUAP Recruitment  Muscle IA Fib PSW Fasc Other Amp Dur. Poly Pattern  L. Deltoid Normal None None None _______ Normal Normal Normal Normal  L. Biceps brachii Normal None None None _______ Normal Normal Normal Normal  L. Triceps brachii Normal None None None _______ Normal Normal Normal Normal  L. Flexor carpi radialis Normal None None None _______ Normal Normal Normal Normal  L. First dorsal interosseous Normal None None None _______ Normal Normal Normal Normal

## 2018-03-07 ENCOUNTER — Encounter: Payer: Self-pay | Admitting: Internal Medicine

## 2018-03-07 ENCOUNTER — Ambulatory Visit: Payer: Medicare Other | Admitting: Internal Medicine

## 2018-03-07 ENCOUNTER — Other Ambulatory Visit: Payer: Medicare Other

## 2018-03-07 VITALS — BP 136/86 | HR 71 | Temp 98.4°F | Resp 16 | Ht 65.0 in | Wt 206.2 lb

## 2018-03-07 DIAGNOSIS — N390 Urinary tract infection, site not specified: Secondary | ICD-10-CM

## 2018-03-07 DIAGNOSIS — N3000 Acute cystitis without hematuria: Secondary | ICD-10-CM | POA: Diagnosis not present

## 2018-03-07 DIAGNOSIS — G5602 Carpal tunnel syndrome, left upper limb: Secondary | ICD-10-CM | POA: Diagnosis not present

## 2018-03-07 DIAGNOSIS — R300 Dysuria: Secondary | ICD-10-CM | POA: Diagnosis not present

## 2018-03-07 NOTE — Patient Instructions (Signed)
Carpal Tunnel Syndrome Carpal tunnel syndrome is a condition that causes pain in your hand and arm. The carpal tunnel is a narrow area located on the palm side of your wrist. Repeated wrist motion or certain diseases may cause swelling within the tunnel. This swelling pinches the main nerve in the wrist (median nerve). What are the causes? This condition may be caused by:  Repeated wrist motions.  Wrist injuries.  Arthritis.  A cyst or tumor in the carpal tunnel.  Fluid buildup during pregnancy.  Sometimes the cause of this condition is not known. What increases the risk? This condition is more likely to develop in:  People who have jobs that cause them to repeatedly move their wrists in the same motion, such as butchers and cashiers.  Women.  People with certain conditions, such as: ? Diabetes. ? Obesity. ? An underactive thyroid (hypothyroidism). ? Kidney failure.  What are the signs or symptoms? Symptoms of this condition include:  A tingling feeling in your fingers, especially in your thumb, index, and middle fingers.  Tingling or numbness in your hand.  An aching feeling in your entire arm, especially when your wrist and elbow are bent for long periods of time.  Wrist pain that goes up your arm to your shoulder.  Pain that goes down into your palm or fingers.  A weak feeling in your hands. You may have trouble grabbing and holding items.  Your symptoms may feel worse during the night. How is this diagnosed? This condition is diagnosed with a medical history and physical exam. You may also have tests, including:  An electromyogram (EMG). This test measures electrical signals sent by your nerves into the muscles.  X-rays.  How is this treated? Treatment for this condition includes:  Lifestyle changes. It is important to stop doing or modify the activity that caused your condition.  Physical or occupational therapy.  Medicines for pain and inflammation.  This may include medicine that is injected into your wrist.  A wrist splint.  Surgery.  Follow these instructions at home: If you have a splint:  Wear it as told by your health care provider. Remove it only as told by your health care provider.  Loosen the splint if your fingers become numb and tingle, or if they turn cold and blue.  Keep the splint clean and dry. General instructions  Take over-the-counter and prescription medicines only as told by your health care provider.  Rest your wrist from any activity that may be causing your pain. If your condition is work related, talk to your employer about changes that can be made, such as getting a wrist pad to use while typing.  If directed, apply ice to the painful area: ? Put ice in a plastic bag. ? Place a towel between your skin and the bag. ? Leave the ice on for 20 minutes, 2-3 times per day.  Keep all follow-up visits as told by your health care provider. This is important.  Do any exercises as told by your health care provider, physical therapist, or occupational therapist. Contact a health care provider if:  You have new symptoms.  Your pain is not controlled with medicines.  Your symptoms get worse. This information is not intended to replace advice given to you by your health care provider. Make sure you discuss any questions you have with your health care provider. Document Released: 07/16/2000 Document Revised: 11/27/2015 Document Reviewed: 12/04/2014 Elsevier Interactive Patient Education  2018 Elsevier Inc.  

## 2018-03-07 NOTE — Progress Notes (Signed)
Subjective:  Patient ID: [name], female    DOB: 25-Jul-1946  Age: 72 y.o. MRN: [id number]  CC: Hypertension and Urinary Tract Infection   HPI [name] presents for f/up - she thinks she has another UTI.  For several days she has noticed bladder pain with dysuria, urgency, and frequency.  She just completed a course of Cipro for a Klebsiella UTI.  This would be her fourth UTI this year.  She also recently had a nerve conduction study done that showed mild left-sided carpal tunnel syndrome.  She has mild numbness in that area but no pain or disability.  She does not want to treat this with medications or therapy.  Outpatient Medications Prior to Visit  Medication Sig Dispense Refill  . atorvastatin (LIPITOR) 20 MG tablet TAKE 1 TABLET BY MOUTH EVERY DAY 90 tablet 1  . cyanocobalamin 2000 MCG tablet Take 1 tablet (2,000 mcg total) by mouth daily. 90 tablet 3  . etodolac (LODINE XL) 400 MG 24 hr tablet Take 1 tablet (400 mg total) by mouth daily. 90 tablet 0  . glucose blood test strip Use TID 100 each 11  . Glycopyrrolate (LONHALA MAGNAIR REFILL KIT) 25 MCG/ML SOLN Inhale 1 puff into the lungs 2 (two) times daily. 60 mL 11  . Insulin Pen Needle (NOVOFINE) 32G X 6 MM MISC 1 Act by Does not apply route daily. 100 each 3  . JANUVIA 100 MG tablet TAKE 1 TABLET (100 MG TOTAL) BY MOUTH DAILY. 90 tablet 1  . LORazepam (ATIVAN) 1 MG tablet Take 0.5 tablets (0.5 mg total) by mouth every 8 (eight) hours as needed for anxiety. 30 tablet 4  . mupirocin ointment (BACTROBAN) 2 % Apply to wound on right thigh BID for 7 days 22 g 0  . nebivolol (BYSTOLIC) 10 MG tablet Take 1 tablet (10 mg total) by mouth daily. 126 tablet 0  . pantoprazole (PROTONIX) 40 MG tablet Take 1 tablet (40 mg total) by mouth daily. (Patient taking differently: Take 40 mg by mouth daily as needed (reflux). ) 90 tablet 3  . potassium chloride SA (KLOR-CON M20) 20 MEQ tablet Take 1 tablet (20 mEq total) by mouth 2  (two) times daily. 180 tablet 1  . repaglinide (PRANDIN) 2 MG tablet TAKE 1 TABLET (2 MG TOTAL) BY MOUTH 3 (THREE) TIMES DAILY BEFORE MEALS. 90 tablet 9  . sertraline (ZOLOFT) 100 MG tablet Take 200 mg by mouth daily.   12  . SUMAtriptan (IMITREX) 50 MG tablet TAKE 1 TABLET BY MOUTH AS NEEDED FOR MIGRAINE, CAN REPEAT IN 2 HOURS IF HEADACHE PERSISTS 10 tablet 5  . telmisartan (MICARDIS) 80 MG tablet Take 1 tablet (80 mg total) by mouth daily. 90 tablet 3  . tizanidine (ZANAFLEX) 2 MG capsule TAKE ONE CAPSULE 3 TIMES A DAY 270 capsule 1  . traZODone (DESYREL) 50 MG tablet Take 100 mg by mouth at bedtime  3  . Dextromethorphan-Guaifenesin (MUCINEX DM) 30-600 MG TB12 Take 1 tablet by mouth 2 (two) times daily as needed. (Patient taking differently: Take 1 tablet by mouth 2 (two) times daily as needed (congestion). ) 60 tablet 5   No facility-administered medications prior to visit.     ROS Review of Systems  Constitutional: Negative for chills, diaphoresis, fatigue and fever.  HENT: Negative.   Eyes: Negative.  Negative for visual disturbance.  Respiratory: Negative for cough, chest tightness and shortness of breath.   Cardiovascular: Negative.  Negative for chest pain, palpitations  and leg swelling.  Gastrointestinal: Negative for abdominal pain, diarrhea, nausea and vomiting.  Genitourinary: Positive for dysuria, frequency and urgency. Negative for decreased urine volume, difficulty urinating and flank pain.  Musculoskeletal: Negative.  Negative for arthralgias and myalgias.  Skin: Negative.  Negative for color change, pallor and rash.  Neurological: Negative.  Negative for dizziness, weakness and light-headedness.  Hematological: Negative for adenopathy. Does not bruise/bleed easily.  Psychiatric/Behavioral: Negative.     Objective:  BP 136/86 (BP Location: Left Arm, Patient Position: Sitting, Cuff Size: Large)   Pulse 71   Temp 98.4 F (36.9 C) (Oral)   Resp 16   Ht '5\' 5"'$  (1.651 m)    Wt 206 lb 4 oz (93.6 kg)   SpO2 97%   BMI 34.32 kg/m   BP Readings from Last 3 Encounters:  03/07/18 136/86  02/15/18 (!) 142/88  02/07/18 140/80    Wt Readings from Last 3 Encounters:  03/07/18 206 lb 4 oz (93.6 kg)  02/15/18 209 lb (94.8 kg)  02/07/18 205 lb 8 oz (93.2 kg)    Physical Exam  Constitutional: She is oriented to person, place, and time. No distress.  HENT:  Mouth/Throat: Oropharynx is clear and moist. No oropharyngeal exudate.  Eyes: Conjunctivae are normal. No scleral icterus.  Neck: Normal range of motion. Neck supple. No JVD present. No thyromegaly present.  Cardiovascular: Normal rate, regular rhythm and normal heart sounds. Exam reveals no gallop.  No murmur heard. Pulmonary/Chest: Effort normal and breath sounds normal. She has no wheezes. She has no rales.  Abdominal: Soft. Normal appearance and bowel sounds are normal. She exhibits no mass. There is no hepatosplenomegaly. There is tenderness in the suprapubic area. There is no rigidity, no guarding and no CVA tenderness.  Musculoskeletal: Normal range of motion. She exhibits no edema, tenderness or deformity.  Lymphadenopathy:    She has no cervical adenopathy.  Neurological: She is alert and oriented to person, place, and time.  Skin: Skin is warm and dry. She is not diaphoretic. No pallor.  Vitals reviewed.   Lab Results  Component Value Date   WBC 4.5 11/29/2017   HGB 12.3 11/29/2017   HCT 36.5 11/29/2017   PLT 177 11/29/2017   GLUCOSE 144 (H) 02/07/2018   CHOL 186 05/31/2017   TRIG 230.0 (H) 05/31/2017   HDL 50.00 05/31/2017   LDLDIRECT 106.0 05/31/2017   LDLCALC 124 (H) 05/11/2016   ALT 11 (L) 11/29/2017   AST 13 (L) 11/29/2017   NA 140 02/07/2018   K 3.9 02/07/2018   CL 103 02/07/2018   CREATININE 0.79 02/07/2018   BUN 16 02/07/2018   CO2 31 02/07/2018   TSH 0.82 08/15/2017   INR 1.33 07/25/2014   HGBA1C 6.0 (A) 01/12/2018   MICROALBUR 3.3 (H) 05/31/2017    Dg Chest Portable  1 View  Result Date: 11/28/2017 CLINICAL DATA:  Hypotension EXAM: PORTABLE CHEST 1 VIEW COMPARISON:  08/15/2017 FINDINGS: Emphysematous hyperinflation of lungs. Top-normal size heart with minimal aortic atherosclerosis. No aneurysm. No acute pulmonary consolidation, effusion or pneumothorax. No suspicious osseous abnormality. IMPRESSION: Hyperinflated lungs without active pulmonary disease. Electronically Signed   By: Ashley Royalty M.D.   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 - Mild decreased amplitudes of bilateral ulnar motor responses  may reflect underlying axonal neuropathy vs technical   Assessment & Plan:   Kimyatta was seen  today for hypertension and urinary tract infection.  Diagnoses and all orders for this visit:  Carpal tunnel syndrome of left wrist-she will let me know if she wants to see an occupational therapist about this.  Dysuria- Her urinalysis is suspicious for a UTI.  The culture is pending. -     Urinalysis, Routine w reflex microscopic; Future -     CULTURE, URINE COMPREHENSIVE; Future  Acute cystitis without hematuria- Will empirically treat her with ciprofloxacin. -     ciprofloxacin (CIPRO) 250 MG tablet; Take 1 tablet (250 mg total) by mouth 2 (two) times daily for 7 days.  Frequent UTI- I have asked her to see urology to try to identify why she is having such frequent UTIs and to consider preventive options. -     Ambulatory referral to Urology   I have discontinued Darrol Jump DM. I am also having her start on ciprofloxacin. Additionally, I am having her maintain her traZODone, cyanocobalamin, sertraline, glucose blood, Insulin Pen Needle, Glycopyrrolate, etodolac, pantoprazole, atorvastatin, nebivolol, telmisartan, LORazepam, potassium chloride SA, tizanidine, repaglinide, JANUVIA, SUMAtriptan, and mupirocin  ointment.  Meds ordered this encounter  Medications  . ciprofloxacin (CIPRO) 250 MG tablet    Sig: Take 1 tablet (250 mg total) by mouth 2 (two) times daily for 7 days.    Dispense:  14 tablet    Refill:  0     Follow-up: Return in about 6 months (around 09/07/2018).  [name], MD

## 2018-03-08 ENCOUNTER — Ambulatory Visit: Payer: Self-pay | Admitting: Internal Medicine

## 2018-03-08 ENCOUNTER — Other Ambulatory Visit (INDEPENDENT_AMBULATORY_CARE_PROVIDER_SITE_OTHER): Payer: Medicare Other

## 2018-03-08 DIAGNOSIS — R300 Dysuria: Secondary | ICD-10-CM

## 2018-03-08 DIAGNOSIS — N390 Urinary tract infection, site not specified: Secondary | ICD-10-CM | POA: Insufficient documentation

## 2018-03-08 DIAGNOSIS — N3000 Acute cystitis without hematuria: Secondary | ICD-10-CM | POA: Insufficient documentation

## 2018-03-08 LAB — URINALYSIS, ROUTINE W REFLEX MICROSCOPIC
Bilirubin Urine: NEGATIVE
Ketones, ur: NEGATIVE
Nitrite: NEGATIVE
RBC / HPF: NONE SEEN (ref 0–?)
Specific Gravity, Urine: 1.015 (ref 1.000–1.030)
Total Protein, Urine: NEGATIVE
Urine Glucose: NEGATIVE
Urobilinogen, UA: 0.2 (ref 0.0–1.0)
pH: 6 (ref 5.0–8.0)

## 2018-03-08 MED ORDER — CIPROFLOXACIN HCL 250 MG PO TABS: 250.0000 mg | ORAL_TABLET | Freq: Two times a day (BID) | ORAL | 0 refills | Status: AC

## 2018-03-10 LAB — CULTURE, URINE COMPREHENSIVE
MICRO NUMBER:: 90934449
SPECIMEN QUALITY:: ADEQUATE

## 2018-03-13 ENCOUNTER — Ambulatory Visit: Payer: Medicare Other | Admitting: Podiatry

## 2018-03-20 ENCOUNTER — Other Ambulatory Visit: Payer: Self-pay | Admitting: Neurosurgery

## 2018-03-20 DIAGNOSIS — G919 Hydrocephalus, unspecified: Secondary | ICD-10-CM

## 2018-03-21 ENCOUNTER — Telehealth: Payer: Self-pay | Admitting: Internal Medicine

## 2018-03-21 NOTE — Telephone Encounter (Signed)
Tried to call number. Sat on hold for some time and then was hung up on.

## 2018-03-21 NOTE — Telephone Encounter (Signed)
Copied from [place] [id number]. Topic: Quick Communication - See Telephone Encounter >> Mar 21, 2018 11:38 AM Bea Graff, NT wrote: CRM for notification. See Telephone encounter for: 03/21/18. Claiborne Billings, health coach with [facility] calling to request a nurse to give her a call regarding this pt and her diabatic needs. CB#: [phone]

## 2018-03-22 ENCOUNTER — Encounter: Payer: Self-pay | Admitting: Internal Medicine

## 2018-03-22 ENCOUNTER — Ambulatory Visit: Payer: Medicare Other | Admitting: Internal Medicine

## 2018-03-22 ENCOUNTER — Other Ambulatory Visit (INDEPENDENT_AMBULATORY_CARE_PROVIDER_SITE_OTHER): Payer: Medicare Other

## 2018-03-22 VITALS — BP 140/70 | HR 70 | Temp 98.5°F | Resp 16 | Ht 65.0 in | Wt 216.0 lb

## 2018-03-22 DIAGNOSIS — E118 Type 2 diabetes mellitus with unspecified complications: Secondary | ICD-10-CM

## 2018-03-22 DIAGNOSIS — N390 Urinary tract infection, site not specified: Secondary | ICD-10-CM

## 2018-03-22 DIAGNOSIS — I10 Essential (primary) hypertension: Secondary | ICD-10-CM | POA: Diagnosis not present

## 2018-03-22 LAB — COMPREHENSIVE METABOLIC PANEL
ALT: 13 U/L (ref 0–35)
AST: 10 U/L (ref 0–37)
Albumin: 4.1 g/dL (ref 3.5–5.2)
Alkaline Phosphatase: 77 U/L (ref 39–117)
BUN: 19 mg/dL (ref 6–23)
CO2: 26 mEq/L (ref 19–32)
Calcium: 9.9 mg/dL (ref 8.4–10.5)
Chloride: 101 mEq/L (ref 96–112)
Creatinine, Ser: 0.99 mg/dL (ref 0.40–1.20)
GFR: 58.75 mL/min — ABNORMAL LOW (ref >60.00)
Glucose, Bld: 212 mg/dL — ABNORMAL HIGH (ref 70–99)
Potassium: 4.4 mEq/L (ref 3.5–5.1)
Sodium: 136 mEq/L (ref 135–145)
Total Bilirubin: 0.5 mg/dL (ref 0.2–1.2)
Total Protein: 7.8 g/dL (ref 6.0–8.3)

## 2018-03-22 LAB — CBC WITH DIFFERENTIAL/PLATELET
Basophils Absolute: 0.1 10*3/uL (ref 0.0–0.1)
Basophils Relative: 1.3 % (ref 0.0–3.0)
Eosinophils Absolute: 0.3 10*3/uL (ref 0.0–0.7)
Eosinophils Relative: 5.6 % — ABNORMAL HIGH (ref 0.0–5.0)
HCT: 42.1 % (ref 36.0–46.0)
Hemoglobin: 14.6 g/dL (ref 12.0–15.0)
Lymphocytes Relative: 27 % (ref 12.0–46.0)
Lymphs Abs: 1.4 10*3/uL (ref 0.7–4.0)
MCHC: 34.7 g/dL (ref 30.0–36.0)
MCV: 92 fl (ref 78.0–100.0)
Monocytes Absolute: 0.4 10*3/uL (ref 0.1–1.0)
Monocytes Relative: 6.6 % (ref 3.0–12.0)
Neutro Abs: 3.2 10*3/uL (ref 1.4–7.7)
Neutrophils Relative %: 59.5 % (ref 43.0–77.0)
Platelets: 215 10*3/uL (ref 150.0–400.0)
RBC: 4.57 Mil/uL (ref 3.87–5.11)
RDW: 12.5 % (ref 11.5–15.5)
WBC: 5.3 10*3/uL (ref 4.0–10.5)

## 2018-03-22 LAB — TSH: TSH: 1.9 u[IU]/mL (ref 0.35–4.50)

## 2018-03-22 NOTE — Patient Instructions (Signed)

## 2018-03-22 NOTE — Progress Notes (Signed)
Subjective:  Patient ID: [name], female    DOB: 1946-05-05  Age: 72 y.o. 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  Today she has a multitude of vague complaints including fatigue, sleepiness, diffuse neck and shoulder pain, poor balance, and continued episodes of vomiting while she is eating.  She has recently gained weight.  Outpatient Medications Prior to Visit  Medication Sig Dispense Refill  . atorvastatin (LIPITOR) 20 MG tablet TAKE 1 TABLET BY MOUTH EVERY DAY 90 tablet 1  . cyanocobalamin 2000 MCG tablet Take 1 tablet (2,000 mcg total) by mouth daily. 90 tablet 3  . etodolac (LODINE XL) 400 MG 24 hr tablet Take 1 tablet (400 mg total) by mouth daily. 90 tablet 0  . glucose blood test strip Use TID 100 each 11  . Glycopyrrolate (LONHALA MAGNAIR REFILL KIT) 25 MCG/ML SOLN Inhale 1 puff into the lungs 2 (two) times daily. 60 mL 11  . Insulin Pen Needle (NOVOFINE) 32G X 6 MM MISC 1 Act by Does not apply route daily. 100 each 3  . JANUVIA 100 MG tablet TAKE 1 TABLET (100 MG TOTAL) BY MOUTH DAILY. 90 tablet 1  . LORazepam (ATIVAN) 1 MG tablet Take 0.5 tablets (0.5 mg total) by mouth every 8 (eight) hours as needed for anxiety. 30 tablet 4  . mupirocin ointment (BACTROBAN) 2 % Apply to wound on right thigh BID for 7 days 22 g 0  . nebivolol (BYSTOLIC) 10 MG tablet Take 1 tablet (10 mg total) by mouth daily. 126 tablet 0  . pantoprazole (PROTONIX) 40 MG tablet Take 1 tablet (40 mg total) by mouth daily. (Patient taking differently: Take 40 mg by mouth daily as needed (reflux). ) 90 tablet 3  . potassium chloride SA (KLOR-CON M20) 20 MEQ tablet Take 1 tablet (20 mEq total) by mouth 2 (two) times daily. 180 tablet 1  . repaglinide (PRANDIN) 2 MG tablet  TAKE 1 TABLET (2 MG TOTAL) BY MOUTH 3 (THREE) TIMES DAILY BEFORE MEALS. 90 tablet 9  . sertraline (ZOLOFT) 100 MG tablet Take 200 mg by mouth daily.   12  . SUMAtriptan (IMITREX) 50 MG tablet TAKE 1 TABLET BY MOUTH AS NEEDED FOR MIGRAINE, CAN REPEAT IN 2 HOURS IF HEADACHE PERSISTS 10 tablet 5  . telmisartan (MICARDIS) 80 MG tablet Take 1 tablet (80 mg total) by mouth daily. 90 tablet 3  . tizanidine (ZANAFLEX) 2 MG capsule TAKE ONE CAPSULE 3 TIMES A DAY 270 capsule 1  . traZODone (DESYREL) 50 MG tablet Take 100 mg by mouth at bedtime  3   No facility-administered medications prior to visit.     ROS Review of Systems  Constitutional: Positive for fatigue and unexpected weight change. Negative for activity change, appetite change, chills and diaphoresis.  HENT: Negative.   Eyes: Negative.   Respiratory: Negative for cough, chest tightness, shortness of breath and wheezing.   Cardiovascular: Negative for chest pain, palpitations and leg swelling.  Gastrointestinal: Positive for vomiting. Negative for abdominal pain, constipation, diarrhea and nausea.  Genitourinary: Negative.  Negative for decreased urine volume, difficulty urinating, dyspareunia, dysuria, flank pain, frequency, hematuria and urgency.  Musculoskeletal: Positive for neck pain. Negative for back pain.  Skin: Negative.  Negative for color  change and pallor.  Neurological: Negative.  Negative for dizziness, weakness, light-headedness and headaches.  Hematological: Negative for adenopathy. Does not bruise/bleed easily.  Psychiatric/Behavioral: Negative.     Objective:  BP 140/70 (BP Location: Left Arm, Patient Position: Sitting, Cuff Size: Large)   Pulse 70   Temp 98.5 F (36.9 C) (Oral)   Resp 16   Ht _0  (1.651 m)   Wt 216 lb (98 kg)   SpO2 97%   BMI 35.94 kg/m   BP Readings from Last 3 Encounters:  03/22/18 140/70  03/07/18 136/86  02/15/18 (!) 142/88    Wt Readings from Last 3 Encounters:  03/22/18 216 lb  (98 kg)  03/07/18 206 lb 4 oz (93.6 kg)  02/15/18 209 lb (94.8 kg)    Physical Exam  Constitutional: She is oriented to person, place, and time. No distress.  HENT:  Mouth/Throat: Oropharynx is clear and moist. No oropharyngeal exudate.  Eyes: Conjunctivae are normal. No scleral icterus.  Neck: Normal range of motion. Neck supple. No JVD present. No thyromegaly present.  Cardiovascular: Normal rate, regular rhythm and normal heart sounds. Exam reveals no gallop and no friction rub.  No murmur heard. Pulmonary/Chest: Effort normal and breath sounds normal. She has no wheezes. She has no rales.  Abdominal: Soft. Bowel sounds are normal. She exhibits no mass. There is no hepatosplenomegaly. There is no tenderness.  Musculoskeletal: Normal range of motion. She exhibits no edema, tenderness or deformity.  Lymphadenopathy:    She has no cervical adenopathy.  Neurological: She is alert and oriented to person, place, and time.  Skin: Skin is warm and dry. She is not diaphoretic. No pallor.  Psychiatric: She has a normal mood and affect. Her behavior is normal. Judgment and thought content normal.  Vitals reviewed.   Lab Results  Component Value Date   WBC 5.3 03/22/2018   HGB 14.6 03/22/2018   HCT 42.1 03/22/2018   PLT 215.0 03/22/2018   GLUCOSE 212 (H) 03/22/2018   CHOL 186 05/31/2017   TRIG 230.0 (H) 05/31/2017   HDL 50.00 05/31/2017   LDLDIRECT 106.0 05/31/2017   LDLCALC 124 (H) 05/11/2016   ALT 13 03/22/2018   AST 10 03/22/2018   NA 136 03/22/2018   K 4.4 03/22/2018   CL 101 03/22/2018   CREATININE 0.99 03/22/2018   BUN 19 03/22/2018   CO2 26 03/22/2018   TSH 1.90 03/22/2018   INR 1.33 07/25/2014   HGBA1C 6.0 (A) 01/12/2018   MICROALBUR 3.3 (H) 05/31/2017    Dg Chest Portable 1 View  Result Date: 11/28/2017 CLINICAL DATA:  Hypotension EXAM: PORTABLE CHEST 1 VIEW COMPARISON:  08/15/2017 FINDINGS: Emphysematous hyperinflation of lungs. Top-normal size heart with minimal  aortic atherosclerosis. No aneurysm. No acute pulmonary consolidation, effusion or pneumothorax. No suspicious osseous abnormality. IMPRESSION: Hyperinflated lungs without active pulmonary disease. Electronically Signed   By: Ashley Royalty M.D.   On: 11/28/2017 03:57    Assessment & Plan:   Braniyah was seen today for hypertension, diabetes and urinary tract infection.  Diagnoses and all orders for this visit:  Frequent UTI- Her urine culture remains positive for Klebsiella despite 2 recent treatments with ciprofloxacin.  She is not symptomatic with respect to this so I do not think another round of antibiotics would be beneficial as this appears to be a colonization.  She has been referred to urology to see if she needs a work-up for frequent UTIs. -     CULTURE, URINE COMPREHENSIVE; Future  Essential  hypertension- Her blood pressure is well controlled.  Electrolytes and renal function are normal.  Her labs are normal.  I do not see anything that explains her symptoms. -     CBC with Differential/Platelet; Future -     Comprehensive metabolic panel; Future -     TSH; Future  Type 2 diabetes mellitus with complication, without long-term current use of insulin ([place])- Her blood sugars are adequately well controlled. -     Comprehensive metabolic panel; Future   I am having [name] maintain her traZODone, cyanocobalamin, sertraline, glucose blood, Insulin Pen Needle, Glycopyrrolate, etodolac, pantoprazole, atorvastatin, nebivolol, telmisartan, LORazepam, potassium chloride SA, tizanidine, repaglinide, JANUVIA, SUMAtriptan, and mupirocin ointment.  No orders of the defined types were placed in this encounter.    Follow-up: Return in about 3 months (around 06/22/2018).  [name], MD

## 2018-03-23 ENCOUNTER — Telehealth: Payer: Self-pay | Admitting: Internal Medicine

## 2018-03-23 NOTE — Telephone Encounter (Signed)
Pt given results per notes of Dr. Ronnald Ramp on 03/23/18. Pt verbalized understanding. Pt states she has not been contacted by a urologist to schedule an appt. Pt states she has urinary incontinence but does not currently having any burning or stinging.Unable to document in result note due to result note not being routed to [facility].

## 2018-03-24 LAB — CULTURE, URINE COMPREHENSIVE
MICRO NUMBER:: 90996727
SPECIMEN QUALITY:: ADEQUATE

## 2018-03-27 ENCOUNTER — Ambulatory Visit: Payer: Self-pay

## 2018-03-27 ENCOUNTER — Ambulatory Visit (INDEPENDENT_AMBULATORY_CARE_PROVIDER_SITE_OTHER): Payer: Medicare Other | Admitting: Podiatry

## 2018-03-27 DIAGNOSIS — M779 Enthesopathy, unspecified: Secondary | ICD-10-CM

## 2018-03-27 DIAGNOSIS — L989 Disorder of the skin and subcutaneous tissue, unspecified: Secondary | ICD-10-CM | POA: Diagnosis not present

## 2018-03-27 DIAGNOSIS — M7751 Other enthesopathy of right foot: Secondary | ICD-10-CM

## 2018-03-29 ENCOUNTER — Telehealth: Payer: Self-pay | Admitting: Internal Medicine

## 2018-03-29 NOTE — Progress Notes (Signed)
   HPI: 72 year old female presenting today for follow up evaluation of capsulitis of the 5th MPJ of the right foot. She states the pain has returned but she received significant relief after the injection.  She also reports a painful callus lesion on the plantar aspect of the left foot. She is concerned she may have stepped on glass after breaking a glass jar. Walking and bearing weight for long periods of time increases the pain. She has not done anything for treatment. Patient is here for further evaluation and treatment.   Past Medical History:  Diagnosis Date  . Acid reflux disease   . Acute kidney injury ([place])   . Anxiety   . Depression   . Diabetes mellitus without complication ([place])   . Dizziness   . Falls   . Hydrocephalus   . Hypertension   . Hypotension   . MVP (mitral valve prolapse)   . Sinus bradycardia   . Transaminitis      Physical Exam: General: The patient is alert and oriented x3 in no acute distress.  Dermatology: Hyperkeratotic lesion present on the left forefoot. Pain on palpation with a central nucleated core noted. Skin is warm, dry and supple bilateral lower extremities. Negative for open lesions or macerations.  Vascular: Palpable pedal pulses bilaterally. No edema or erythema noted. Capillary refill within normal limits.  Neurological: Epicritic and protective threshold grossly intact bilaterally.   Musculoskeletal Exam: Pain with palpation to the 5th MPJ of the right foot. Range of motion within normal limits to all pedal and ankle joints bilateral. Muscle strength 5/5 in all groups bilateral.   Assessment: 1. 5th MPJ capsulitis right  2. Porokeratosis left forefoot    Plan of Care:  1. Patient evaluated.  2. Injection of 0.5 mLs Celestone Soluspan injected into the 5th MPJ of the right foot.  3. Excisional debridement of keratoic lesion using a chisel blade was performed without incident. Light dressing applied.  4. Return to clinic as needed.        Edrick Kins, DPM [facility]  Dr. Edrick Kins, DPM    [address], [place] [address]                Office [phone]  Fax [phone]

## 2018-03-29 NOTE — Telephone Encounter (Signed)
Copied from [place] [id number]. Topic: Quick Communication - See Telephone Encounter >> Mar 29, 2018  3:03 PM Synthia Innocent wrote: CRM for notification. See Telephone encounter for: 03/29/18. Urologist is unable to get patient in till 04/20/18 for UTI, would like to know if she should be put on antibiotic? Concerned with waiting till then with no meds. [facility]. Please advise

## 2018-03-29 NOTE — Telephone Encounter (Signed)
Patient was seen on 03/22/18 for this issue. Please advise or follow up with patient. Thank you.

## 2018-03-30 ENCOUNTER — Telehealth: Payer: Self-pay | Admitting: Internal Medicine

## 2018-03-30 NOTE — Telephone Encounter (Signed)
Got disconnected with patient, need to know if she is having any urinary symptoms.

## 2018-03-30 NOTE — Telephone Encounter (Signed)
Copied from [place] [phone]. Topic: General - Other >> Mar 30, 2018  3:15 PM Carolyn Stare wrote:  Claiborne Billings with [facility]  diabetic and is asking for a call back to see if she can get pt some help because pt is having a hard time giving herself insulin .Said pt had brain surgery

## 2018-03-30 NOTE — Telephone Encounter (Signed)
LVM for pt to call back as soon as possible.   

## 2018-03-30 NOTE — Telephone Encounter (Signed)
If patient calls back please make nurse appt for instruction on insulin use,  If she would like stefannie 8/30 is the best day for appt.

## 2018-03-31 ENCOUNTER — Encounter: Payer: Self-pay | Admitting: Internal Medicine

## 2018-03-31 ENCOUNTER — Ambulatory Visit: Payer: Medicare Other | Admitting: Internal Medicine

## 2018-03-31 ENCOUNTER — Ambulatory Visit: Payer: Medicare Other

## 2018-03-31 VITALS — BP 120/82 | HR 94 | Temp 98.1°F | Ht 65.0 in | Wt 206.0 lb

## 2018-03-31 DIAGNOSIS — R197 Diarrhea, unspecified: Secondary | ICD-10-CM | POA: Diagnosis not present

## 2018-03-31 DIAGNOSIS — Z23 Encounter for immunization: Secondary | ICD-10-CM | POA: Diagnosis not present

## 2018-03-31 DIAGNOSIS — N390 Urinary tract infection, site not specified: Secondary | ICD-10-CM | POA: Diagnosis not present

## 2018-03-31 DIAGNOSIS — R112 Nausea with vomiting, unspecified: Secondary | ICD-10-CM

## 2018-03-31 MED ORDER — SULFAMETHOXAZOLE-TRIMETHOPRIM 800-160 MG PO TABS: 1.0000 | ORAL_TABLET | Freq: Two times a day (BID) | ORAL | 0 refills | Status: DC

## 2018-03-31 NOTE — Patient Instructions (Addendum)
We have sent in bactrim. Take 1 pill twice a day for 1 week.    Food Choices to Help Relieve Diarrhea, Adult When you have diarrhea, the foods you eat and your eating habits are very important. Choosing the right foods and drinks can help:  Relieve diarrhea.  Replace lost fluids and nutrients.  Prevent dehydration.  What general guidelines should I follow? Relieving diarrhea  Choose foods with less than 2 g or .07 oz. of fiber per serving.  Limit fats to less than 8 tsp (38 g or 1.34 oz.) a day.  Avoid the following: ? Foods and beverages sweetened with high-fructose corn syrup, honey, or sugar alcohols such as xylitol, sorbitol, and mannitol. ? Foods that contain a lot of fat or sugar. ? Fried, greasy, or spicy foods. ? High-fiber grains, breads, and cereals. ? Raw fruits and vegetables.  Eat foods that are rich in probiotics. These foods include dairy products such as yogurt and fermented milk products. They help increase healthy bacteria in the stomach and intestines (gastrointestinal tract, or GI tract).  If you have lactose intolerance, avoid dairy products. These may make your diarrhea worse.  Take medicine to help stop diarrhea (antidiarrheal medicine) only as told by your health care provider. Replacing nutrients  Eat small meals or snacks every 3-4 hours.  Eat bland foods, such as white rice, toast, or baked potato, until your diarrhea starts to get better. Gradually reintroduce nutrient-rich foods as tolerated or as told by your health care provider. This includes: ? Well-cooked protein foods. ? Peeled, seeded, and soft-cooked fruits and vegetables. ? Low-fat dairy products.  Take vitamin and mineral supplements as told by your health care provider. Preventing dehydration   Start by sipping water or a special solution to prevent dehydration (oral rehydration solution, ORS). Urine that is clear or pale yellow means that you are getting enough fluid.  Try to  drink at least 8-10 cups of fluid each day to help replace lost fluids.  You may add other liquids in addition to water, such as clear juice or decaffeinated sports drinks, as tolerated or as told by your health care provider.  Avoid drinks with caffeine, such as coffee, tea, or soft drinks.  Avoid alcohol. What foods are recommended? The items listed may not be a complete list. Talk with your health care provider about what dietary choices are best for you. Grains White rice. White, [place], or pita breads (fresh or toasted), including plain rolls, buns, or bagels. White pasta. Saltine, soda, or graham crackers. Pretzels. Low-fiber cereal. Cooked cereals made with water (such as cornmeal, farina, or cream cereals). Plain muffins. Matzo. Melba toast. Zwieback. Vegetables Potatoes (without the skin). Most well-cooked and canned vegetables without skins or seeds. Tender lettuce. Fruits Apple sauce. Fruits canned in juice. Cooked apricots, cherries, grapefruit, peaches, pears, or plums. Fresh bananas and cantaloupe. Meats and other protein foods Baked or boiled chicken. Eggs. Tofu. Fish. Seafood. Smooth nut butters. Ground or well-cooked tender beef, ham, veal, lamb, pork, or poultry. Dairy Plain yogurt, kefir, and unsweetened liquid yogurt. Lactose-free milk, buttermilk, skim milk, or soy milk. Low-fat or nonfat hard cheese. Beverages Water. Low-calorie sports drinks. Fruit juices without pulp. Strained tomato and vegetable juices. Decaffeinated teas. Sugar-free beverages not sweetened with sugar alcohols. Oral rehydration solutions, if approved by your health care provider. Seasoning and other foods Bouillon, broth, or soups made from recommended foods. What foods are not recommended? The items listed may not be a complete list. Talk with  your health care provider about what dietary choices are best for you. Grains Whole grain, whole wheat, bran, or rye breads, rolls, pastas, and crackers.  Wild or brown rice. Whole grain or bran cereals. Barley. Oats and oatmeal. Corn tortillas or taco shells. Granola. Popcorn. Vegetables Raw vegetables. Fried vegetables. Cabbage, broccoli, Brussels sprouts, artichokes, baked beans, beet greens, corn, kale, legumes, peas, sweet potatoes, and yams. Potato skins. Cooked spinach and cabbage. Fruits Dried fruit, including raisins and dates. Raw fruits. Stewed or dried prunes. Canned fruits with syrup. Meat and other protein foods Fried or fatty meats. Deli meats. Chunky nut butters. Nuts and seeds. Beans and lentils. Berniece Salines. Hot dogs. Sausage. Dairy High-fat cheeses. Whole milk, chocolate milk, and beverages made with milk, such as milk shakes. Half-and-half. Cream. sour cream. Ice cream. Beverages Caffeinated beverages (such as coffee, tea, soda, or energy drinks). Alcoholic beverages. Fruit juices with pulp. Prune juice. Soft drinks sweetened with high-fructose corn syrup or sugar alcohols. High-calorie sports drinks. Fats and oils Butter. Cream sauces. Margarine. Salad oils. Plain salad dressings. Olives. Avocados. Mayonnaise. Sweets and desserts Sweet rolls, doughnuts, and sweet breads. Sugar-free desserts sweetened with sugar alcohols such as xylitol and sorbitol. Seasoning and other foods Honey. Hot sauce. Chili powder. Gravy. Cream-based or milk-based soups. Pancakes and waffles. Summary  When you have diarrhea, the foods you eat and your eating habits are very important.  Make sure you get at least 8-10 cups of fluid each day, or enough to keep your urine clear or pale yellow.  Eat bland foods and gradually reintroduce healthy, nutrient-rich foods as tolerated, or as told by your health care provider.  Avoid high-fiber, fried, greasy, or spicy foods. This information is not intended to replace advice given to you by your health care provider. Make sure you discuss any questions you have with your health care provider. Document Released:  10/09/2003 Document Revised: 07/16/2016 Document Reviewed: 07/16/2016 Elsevier Interactive Patient Education  [employer].

## 2018-03-31 NOTE — Assessment & Plan Note (Signed)
Recent culture with infection persistent and she is having symptoms. Will treat with bactrim 7 day course. She will still keep visit with urology in several weeks.

## 2018-03-31 NOTE — Assessment & Plan Note (Signed)
Declines rx for zofran. Overall it is improving and she feels it is related to recent GI illness. Advised to increase fluid intake and BRAT diet given.

## 2018-03-31 NOTE — Assessment & Plan Note (Signed)
Likely related to food exposure. She is improving already and we talked about brat diet. She is eating a lot of watermelon and we talked about how that can worsen or prolong diarrhea due to the fiber and sugar in it.

## 2018-03-31 NOTE — Progress Notes (Signed)
   Subjective:    Patient ID: [name], female    DOB: Apr 24, 1946, 72 y.o.   MRN: [id number]  HPI The patient is a 72 YO female coming in for several concerns including recurrent UTI (some pressure while urinating, seeing urology but not for 2-3 weeks and was worried about this, denies fevers or chills, currently nausea and vomiting from GI issue, no vomiting today), and diarrhea (started yesterday, with nausea and vomiting, denies blood in diarrhea, she thinks that she ate some bad food, denies new medications or changes in doses, is drinking water and can eat watermelon).   Review of Systems  Constitutional: Negative.   HENT: Negative.   Eyes: Negative.   Respiratory: Negative for cough, chest tightness and shortness of breath.   Cardiovascular: Negative for chest pain, palpitations and leg swelling.  Gastrointestinal: Positive for diarrhea, nausea and vomiting. Negative for abdominal distention, abdominal pain and constipation.  Genitourinary: Positive for frequency and pelvic pain.  Musculoskeletal: Negative.   Skin: Negative.   Neurological: Negative.   Psychiatric/Behavioral: Negative.       Objective:   Physical Exam  Constitutional: She is oriented to person, place, and time. She appears well-developed and well-nourished.  HENT:  Head: Normocephalic and atraumatic.  Eyes: EOM are normal.  Neck: Normal range of motion.  Cardiovascular: Normal rate and regular rhythm.  Pulmonary/Chest: Effort normal and breath sounds normal. No respiratory distress. She has no wheezes. She has no rales.  Abdominal: Soft. Bowel sounds are normal. She exhibits no distension. There is no tenderness. There is no rebound.  Musculoskeletal: She exhibits no edema.  Neurological: She is alert and oriented to person, place, and time. Coordination normal.  Skin: Skin is warm and dry.  Psychiatric: She has a normal mood and affect.   Vitals:   03/31/18 1412  BP: 120/82  Pulse: 94  Temp:  98.1 F (36.7 C)  TempSrc: Oral  SpO2: 96%  Weight: 206 lb (93.4 kg)  Height: 5\' 5"  (1.651 m)      Assessment & Plan:  Flu shot given at visit

## 2018-03-31 NOTE — Telephone Encounter (Signed)
Pt was seen in office today

## 2018-04-06 ENCOUNTER — Telehealth: Payer: Self-pay | Admitting: Internal Medicine

## 2018-04-06 NOTE — Telephone Encounter (Signed)
Can you have them refax please?

## 2018-04-06 NOTE — Telephone Encounter (Signed)
Stayed on hold til 5:00pm , did not get anyone to answer, I will not be here tomorrow

## 2018-04-06 NOTE — Telephone Encounter (Signed)
Copied from [place]. Topic: General - Other >> Apr 06, 2018 10:41 AM Yvette Rack wrote: Reason for CRM: Claiborne Billings with [facility] states she faxed a form on 03/22/18 for Diabetic Education but she has not received anything back. Claiborne Billings requests a call back. Cb# [phone]

## 2018-04-10 NOTE — Telephone Encounter (Signed)
[name] will be resending. Main fax number given.

## 2018-04-17 NOTE — Telephone Encounter (Signed)
[name] asking if you received this fax.

## 2018-04-17 NOTE — Telephone Encounter (Signed)
Called and gave fax number for them to resend form.

## 2018-04-18 ENCOUNTER — Other Ambulatory Visit: Payer: Self-pay | Admitting: Internal Medicine

## 2018-04-18 DIAGNOSIS — E118 Type 2 diabetes mellitus with unspecified complications: Secondary | ICD-10-CM

## 2018-04-19 ENCOUNTER — Telehealth: Payer: Self-pay | Admitting: Internal Medicine

## 2018-04-19 NOTE — Telephone Encounter (Signed)
Copied from [place] [id number]. Topic: General - Other >> Apr 19, 2018  3:32 PM Janace Aris A wrote: Reason for CRM: patient called in wanting to know if there is another specialist she could see, she says she does not have 40$ to pay for her copay, and without the co pay payment upfront she can not be seen.      Please advise

## 2018-04-21 NOTE — Telephone Encounter (Signed)
[name] has not received the form yet. [name] will refax again

## 2018-05-10 ENCOUNTER — Ambulatory Visit: Payer: Medicare Other | Admitting: Internal Medicine

## 2018-05-10 ENCOUNTER — Encounter: Payer: Self-pay | Admitting: Internal Medicine

## 2018-05-10 VITALS — BP 128/88 | HR 71 | Temp 98.4°F | Resp 16 | Ht 65.0 in | Wt 212.0 lb

## 2018-05-10 DIAGNOSIS — I10 Essential (primary) hypertension: Secondary | ICD-10-CM

## 2018-05-10 DIAGNOSIS — G910 Communicating hydrocephalus: Secondary | ICD-10-CM

## 2018-05-10 DIAGNOSIS — E118 Type 2 diabetes mellitus with unspecified complications: Secondary | ICD-10-CM

## 2018-05-10 NOTE — Progress Notes (Signed)
Subjective:  Patient ID: [name], female    DOB: April 14, 1946  Age: 72 y.o. MRN: [id number]  CC: Hypertension and Diabetes   HPI [name] presents for f/up - She continues to complain of episodes of nausea and regurgitation.  She says recently it has only happen when she eats meat.  She is not losing weight and she is getting her calories elsewhere.  She has had a few episodes of double vision recently and tells me she is seeing her ophthalmologist soon.  She continues to complain of her usual symptoms without any change.  Outpatient Medications Prior to Visit  Medication Sig Dispense Refill  . atorvastatin (LIPITOR) 20 MG tablet TAKE 1 TABLET BY MOUTH EVERY DAY 90 tablet 1  . cyanocobalamin 2000 MCG tablet Take 1 tablet (2,000 mcg total) by mouth daily. 90 tablet 3  . etodolac (LODINE XL) 400 MG 24 hr tablet Take 1 tablet (400 mg total) by mouth daily. 90 tablet 0  . glucose blood (ONE TOUCH ULTRA TEST) test strip USE TO CHECK BLOOD SUGAR 3 TIMES DAILY 100 each 11  . Glycopyrrolate (LONHALA MAGNAIR REFILL KIT) 25 MCG/ML SOLN Inhale 1 puff into the lungs 2 (two) times daily. 60 mL 11  . Insulin Pen Needle (NOVOFINE) 32G X 6 MM MISC 1 Act by Does not apply route daily. 100 each 3  . JANUVIA 100 MG tablet TAKE 1 TABLET (100 MG TOTAL) BY MOUTH DAILY. 90 tablet 1  . LORazepam (ATIVAN) 1 MG tablet Take 0.5 tablets (0.5 mg total) by mouth every 8 (eight) hours as needed for anxiety. 30 tablet 4  . mupirocin ointment (BACTROBAN) 2 % Apply to wound on right thigh BID for 7 days 22 g 0  . nebivolol (BYSTOLIC) 10 MG tablet Take 1 tablet (10 mg total) by mouth daily. 126 tablet 0  . pantoprazole (PROTONIX) 40 MG tablet Take 1 tablet (40 mg total) by mouth daily. (Patient taking differently: Take 40 mg by mouth daily as needed (reflux). ) 90 tablet 3  . potassium chloride SA (KLOR-CON M20) 20 MEQ tablet Take 1 tablet (20 mEq total) by mouth 2 (two) times daily. 180 tablet 1  .  repaglinide (PRANDIN) 2 MG tablet TAKE 1 TABLET (2 MG TOTAL) BY MOUTH 3 (THREE) TIMES DAILY BEFORE MEALS. 90 tablet 9  . SUMAtriptan (IMITREX) 50 MG tablet TAKE 1 TABLET BY MOUTH AS NEEDED FOR MIGRAINE, CAN REPEAT IN 2 HOURS IF HEADACHE PERSISTS 10 tablet 5  . telmisartan (MICARDIS) 80 MG tablet Take 1 tablet (80 mg total) by mouth daily. 90 tablet 3  . tizanidine (ZANAFLEX) 2 MG capsule TAKE ONE CAPSULE 3 TIMES A DAY 270 capsule 1  . traZODone (DESYREL) 50 MG tablet Take 100 mg by mouth at bedtime  3  . Vilazodone HCl (VIIBRYD) 40 MG TABS Take by mouth daily.    . sertraline (ZOLOFT) 100 MG tablet Take 200 mg by mouth daily.   12  . sulfamethoxazole-trimethoprim (BACTRIM DS,SEPTRA DS) 800-160 MG tablet Take 1 tablet by mouth 2 (two) times daily. 14 tablet 0   No facility-administered medications prior to visit.     ROS Review of Systems  Constitutional: Positive for fatigue. Negative for appetite change, diaphoresis and unexpected weight change.  HENT: Negative.  Negative for trouble swallowing.   Eyes: Positive for visual disturbance. Negative for photophobia.  Respiratory: Negative for cough, chest tightness, shortness of breath and wheezing.   Cardiovascular: Negative for chest pain, palpitations and  leg swelling.  Gastrointestinal: Negative for abdominal pain, constipation, diarrhea, nausea and vomiting.  Genitourinary: Negative.  Negative for difficulty urinating and dysuria.  Musculoskeletal: Positive for arthralgias and gait problem. Negative for back pain, myalgias and neck pain.  Skin: Negative.   Neurological: Negative for dizziness, weakness and headaches.  Hematological: Negative for adenopathy. Does not bruise/bleed easily.  Psychiatric/Behavioral: Negative.     Objective:  BP 128/88 (BP Location: Left Arm, Patient Position: Sitting, Cuff Size: Large)   Pulse 71   Temp 98.4 F (36.9 C) (Oral)   Resp 16   Ht '5\' 5"'$  (1.651 m)   Wt 212 lb (96.2 kg)   SpO2 96%   BMI  35.28 kg/m   BP Readings from Last 3 Encounters:  05/10/18 128/88  03/31/18 120/82  03/22/18 140/70    Wt Readings from Last 3 Encounters:  05/10/18 212 lb (96.2 kg)  03/31/18 206 lb (93.4 kg)  03/22/18 216 lb (98 kg)    Physical Exam  Constitutional: She is oriented to person, place, and time. No distress.  HENT:  Mouth/Throat: Oropharynx is clear and moist. No oropharyngeal exudate.  Eyes: Conjunctivae are normal. No scleral icterus.  Neck: Normal range of motion. Neck supple. No JVD present. No thyromegaly present.  Cardiovascular: Normal rate, regular rhythm and normal heart sounds. Exam reveals no gallop.  No murmur heard. Pulmonary/Chest: Effort normal and breath sounds normal. No respiratory distress. She has no wheezes. She has no rales.  Abdominal: Soft. Bowel sounds are normal. She exhibits no mass. There is no hepatosplenomegaly. There is no tenderness.  Musculoskeletal: Normal range of motion. She exhibits no edema, tenderness or deformity.  Lymphadenopathy:    She has no cervical adenopathy.  Neurological: She is alert and oriented to person, place, and time. She displays normal reflexes. No sensory deficit. She exhibits normal muscle tone. Coordination abnormal.  Skin: Skin is warm and dry. She is not diaphoretic. No pallor.  Psychiatric: She has a normal mood and affect. Her behavior is normal. Judgment and thought content normal.  Vitals reviewed.   Lab Results  Component Value Date   WBC 5.3 03/22/2018   HGB 14.6 03/22/2018   HCT 42.1 03/22/2018   PLT 215.0 03/22/2018   GLUCOSE 212 (H) 03/22/2018   CHOL 186 05/31/2017   TRIG 230.0 (H) 05/31/2017   HDL 50.00 05/31/2017   LDLDIRECT 106.0 05/31/2017   LDLCALC 124 (H) 05/11/2016   ALT 13 03/22/2018   AST 10 03/22/2018   NA 136 03/22/2018   K 4.4 03/22/2018   CL 101 03/22/2018   CREATININE 0.99 03/22/2018   BUN 19 03/22/2018   CO2 26 03/22/2018   TSH 1.90 03/22/2018   INR 1.33 07/25/2014   HGBA1C  6.0 (A) 01/12/2018   MICROALBUR 3.3 (H) 05/31/2017    Dg Chest Portable 1 View  Result Date: 11/28/2017 CLINICAL DATA:  Hypotension EXAM: PORTABLE CHEST 1 VIEW COMPARISON:  08/15/2017 FINDINGS: Emphysematous hyperinflation of lungs. Top-normal size heart with minimal aortic atherosclerosis. No aneurysm. No acute pulmonary consolidation, effusion or pneumothorax. No suspicious osseous abnormality. IMPRESSION: Hyperinflated lungs without active pulmonary disease. Electronically Signed   By: Ashley Royalty M.D.   On: 11/28/2017 03:57    Assessment & Plan:   [name] was seen today for hypertension and diabetes.  Diagnoses and all orders for this visit:  Essential hypertension- Her BP is adequately well controlled.  Type II diabetes mellitus with manifestations ([place])- Her blood sugars are well controlled.  HYDROCEPHALUS, NORMAL PRESSURE- She  has mild lingering symptoms but her neurologic exam is stable.  She recently saw her neurosurgeon and was told that her shunt is performing normally.  No concerns noted and no changes needed at this time.   I have discontinued Karen Chafe. Goll's sertraline and sulfamethoxazole-trimethoprim. I am also having her maintain her traZODone, cyanocobalamin, Insulin Pen Needle, Glycopyrrolate, etodolac, pantoprazole, atorvastatin, nebivolol, telmisartan, LORazepam, potassium chloride SA, tizanidine, repaglinide, JANUVIA, SUMAtriptan, mupirocin ointment, glucose blood, and Vilazodone HCl.  No orders of the defined types were placed in this encounter.    Follow-up: Return in about 4 months (around 09/10/2018).  [name], MD

## 2018-05-10 NOTE — Patient Instructions (Signed)

## 2018-05-11 ENCOUNTER — Other Ambulatory Visit: Payer: Self-pay | Admitting: Internal Medicine

## 2018-05-11 DIAGNOSIS — Z1231 Encounter for screening mammogram for malignant neoplasm of breast: Secondary | ICD-10-CM

## 2018-05-17 NOTE — Telephone Encounter (Signed)
error 

## 2018-05-18 ENCOUNTER — Telehealth: Payer: Self-pay | Admitting: Internal Medicine

## 2018-05-18 NOTE — Telephone Encounter (Signed)
Copied from [place] [id number]. Topic: General - Other >> May 18, 2018  1:54 PM Cecelia Byars, NT wrote: Reason for CRM: [facility] called and said they have received a referral  that is completed was returned back to them, it needs to go to the patient unless there is a place a for her to for diabetic counseling,

## 2018-06-07 ENCOUNTER — Other Ambulatory Visit (INDEPENDENT_AMBULATORY_CARE_PROVIDER_SITE_OTHER): Payer: Medicare Other

## 2018-06-07 ENCOUNTER — Ambulatory Visit: Payer: Medicare Other | Admitting: Internal Medicine

## 2018-06-07 ENCOUNTER — Encounter: Payer: Self-pay | Admitting: Internal Medicine

## 2018-06-07 VITALS — BP 144/80 | HR 74 | Temp 98.2°F | Resp 16 | Ht 65.0 in | Wt 214.0 lb

## 2018-06-07 DIAGNOSIS — I10 Essential (primary) hypertension: Secondary | ICD-10-CM

## 2018-06-07 DIAGNOSIS — D510 Vitamin B12 deficiency anemia due to intrinsic factor deficiency: Secondary | ICD-10-CM

## 2018-06-07 DIAGNOSIS — E118 Type 2 diabetes mellitus with unspecified complications: Secondary | ICD-10-CM

## 2018-06-07 LAB — URINALYSIS, ROUTINE W REFLEX MICROSCOPIC
Hgb urine dipstick: NEGATIVE
Ketones, ur: NEGATIVE
Nitrite: NEGATIVE
Specific Gravity, Urine: 1.025 (ref 1.000–1.030)
Total Protein, Urine: NEGATIVE
Urine Glucose: NEGATIVE
Urobilinogen, UA: 0.2 (ref 0.0–1.0)
pH: 6 (ref 5.0–8.0)

## 2018-06-07 LAB — CBC WITH DIFFERENTIAL/PLATELET
Basophils Absolute: 0.1 10*3/uL (ref 0.0–0.1)
Basophils Relative: 1.2 % (ref 0.0–3.0)
Eosinophils Absolute: 0.1 10*3/uL (ref 0.0–0.7)
Eosinophils Relative: 2 % (ref 0.0–5.0)
HCT: 45.5 % (ref 36.0–46.0)
Hemoglobin: 15.9 g/dL — ABNORMAL HIGH (ref 12.0–15.0)
Lymphocytes Relative: 20 % (ref 12.0–46.0)
Lymphs Abs: 1.3 10*3/uL (ref 0.7–4.0)
MCHC: 35.1 g/dL (ref 30.0–36.0)
MCV: 92.1 fl (ref 78.0–100.0)
Monocytes Absolute: 0.4 10*3/uL (ref 0.1–1.0)
Monocytes Relative: 6 % (ref 3.0–12.0)
Neutro Abs: 4.7 10*3/uL (ref 1.4–7.7)
Neutrophils Relative %: 70.8 % (ref 43.0–77.0)
Platelets: 253 10*3/uL (ref 150.0–400.0)
RBC: 4.94 Mil/uL (ref 3.87–5.11)
RDW: 12.6 % (ref 11.5–15.5)
WBC: 6.7 10*3/uL (ref 4.0–10.5)

## 2018-06-07 LAB — COMPREHENSIVE METABOLIC PANEL
ALT: 11 U/L (ref 0–35)
AST: 12 U/L (ref 0–37)
Albumin: 4.5 g/dL (ref 3.5–5.2)
Alkaline Phosphatase: 75 U/L (ref 39–117)
BUN: 15 mg/dL (ref 6–23)
CO2: 30 mEq/L (ref 19–32)
Calcium: 10.3 mg/dL (ref 8.4–10.5)
Chloride: 100 mEq/L (ref 96–112)
Creatinine, Ser: 0.86 mg/dL (ref 0.40–1.20)
GFR: 69.07 mL/min (ref >60.00)
Glucose, Bld: 190 mg/dL — ABNORMAL HIGH (ref 70–99)
Potassium: 3.8 mEq/L (ref 3.5–5.1)
Sodium: 139 mEq/L (ref 135–145)
Total Bilirubin: 0.7 mg/dL (ref 0.2–1.2)
Total Protein: 8.2 g/dL (ref 6.0–8.3)

## 2018-06-07 LAB — TSH: TSH: 1.01 u[IU]/mL (ref 0.35–4.50)

## 2018-06-07 LAB — FOLATE: Folate: 15.3 ng/mL (ref >5.9)

## 2018-06-07 LAB — MICROALBUMIN / CREATININE URINE RATIO
Creatinine,U: 253.8 mg/dL
Microalb Creat Ratio: 0.9 mg/g (ref 0.0–30.0)
Microalb, Ur: 2.3 mg/dL — ABNORMAL HIGH (ref 0.0–1.9)

## 2018-06-07 LAB — VITAMIN B12: Vitamin B-12: 236 pg/mL (ref 211–911)

## 2018-06-07 LAB — HEMOGLOBIN A1C: Hgb A1c MFr Bld: 7.1 % — ABNORMAL HIGH (ref 4.6–6.5)

## 2018-06-07 NOTE — Progress Notes (Signed)
Subjective:  Patient ID: [name], female    DOB: 01/20/1946  Age: 72 y.o. MRN: [id number]  CC: Diabetes and Hypertension   HPI [name] presents for f/up - She complains of a one-month history of mild weight gain, fatigue, and excessive somnolence.  She said she can sleep for 16 hours straight.  She denies CP, DOE, palpitations, edema, dysuria, or hematuria.  Outpatient Medications Prior to Visit  Medication Sig Dispense Refill  . atorvastatin (LIPITOR) 20 MG tablet TAKE 1 TABLET BY MOUTH EVERY DAY 90 tablet 1  . cyanocobalamin 2000 MCG tablet Take 1 tablet (2,000 mcg total) by mouth daily. 90 tablet 3  . etodolac (LODINE XL) 400 MG 24 hr tablet Take 1 tablet (400 mg total) by mouth daily. 90 tablet 0  . glucose blood (ONE TOUCH ULTRA TEST) test strip USE TO CHECK BLOOD SUGAR 3 TIMES DAILY 100 each 11  . Glycopyrrolate (LONHALA MAGNAIR REFILL KIT) 25 MCG/ML SOLN Inhale 1 puff into the lungs 2 (two) times daily. 60 mL 11  . Insulin Pen Needle (NOVOFINE) 32G X 6 MM MISC 1 Act by Does not apply route daily. 100 each 3  . JANUVIA 100 MG tablet TAKE 1 TABLET (100 MG TOTAL) BY MOUTH DAILY. 90 tablet 1  . LORazepam (ATIVAN) 1 MG tablet Take 0.5 tablets (0.5 mg total) by mouth every 8 (eight) hours as needed for anxiety. 30 tablet 4  . nebivolol (BYSTOLIC) 10 MG tablet Take 1 tablet (10 mg total) by mouth daily. 126 tablet 0  . pantoprazole (PROTONIX) 40 MG tablet Take 1 tablet (40 mg total) by mouth daily. (Patient taking differently: Take 40 mg by mouth daily as needed (reflux). ) 90 tablet 3  . repaglinide (PRANDIN) 2 MG tablet TAKE 1 TABLET (2 MG TOTAL) BY MOUTH 3 (THREE) TIMES DAILY BEFORE MEALS. 90 tablet 9  . SUMAtriptan (IMITREX) 50 MG tablet TAKE 1 TABLET BY MOUTH AS NEEDED FOR MIGRAINE, CAN REPEAT IN 2 HOURS IF HEADACHE PERSISTS 10 tablet 5  . telmisartan (MICARDIS) 80 MG tablet Take 1 tablet (80 mg total) by mouth daily. 90 tablet 3  . tizanidine (ZANAFLEX) 2 MG  capsule TAKE ONE CAPSULE 3 TIMES A DAY 270 capsule 1  . traZODone (DESYREL) 50 MG tablet Take 100 mg by mouth at bedtime  3  . Vilazodone HCl (VIIBRYD) 40 MG TABS Take by mouth daily.    . mupirocin ointment (BACTROBAN) 2 % Apply to wound on right thigh BID for 7 days 22 g 0  . potassium chloride SA (KLOR-CON M20) 20 MEQ tablet Take 1 tablet (20 mEq total) by mouth 2 (two) times daily. 180 tablet 1   No facility-administered medications prior to visit.     ROS Review of Systems  Constitutional: Positive for fatigue and unexpected weight change. Negative for appetite change, chills and diaphoresis.  HENT: Negative.   Eyes: Negative for visual disturbance.  Respiratory: Negative for cough, chest tightness, shortness of breath and wheezing.   Cardiovascular: Negative for chest pain, palpitations and leg swelling.  Gastrointestinal: Negative for abdominal pain, constipation, diarrhea, nausea and vomiting.  Endocrine: Negative.  Negative for polydipsia, polyphagia and polyuria.  Genitourinary: Negative.   Musculoskeletal: Negative.  Negative for myalgias.  Skin: Negative.  Negative for color change.  Neurological: Negative.  Negative for dizziness, weakness, light-headedness and headaches.  Hematological: Negative for adenopathy. Does not bruise/bleed easily.  Psychiatric/Behavioral: Positive for sleep disturbance. Negative for dysphoric mood. The patient is not  nervous/anxious.     Objective:  BP (!) 144/80 (BP Location: Left Arm, Patient Position: Sitting, Cuff Size: Large)   Pulse 74   Temp 98.2 F (36.8 C) (Oral)   Resp 16   Ht _0  (1.651 m)   Wt 214 lb (97.1 kg)   SpO2 97%   BMI 35.61 kg/m   BP Readings from Last 3 Encounters:  06/07/18 (!) 144/80  05/10/18 128/88  03/31/18 120/82    Wt Readings from Last 3 Encounters:  06/07/18 214 lb (97.1 kg)  05/10/18 212 lb (96.2 kg)  03/31/18 206 lb (93.4 kg)    Physical Exam  Constitutional: She is oriented to person,  place, and time. No distress.  HENT:  Mouth/Throat: Oropharynx is clear and moist. No oropharyngeal exudate.  Eyes: Conjunctivae are normal. No scleral icterus.  Neck: Normal range of motion. Neck supple. No JVD present. No thyromegaly present.  Cardiovascular: Normal rate, regular rhythm and normal heart sounds. Exam reveals no gallop.  No murmur heard. Pulmonary/Chest: Effort normal and breath sounds normal. No respiratory distress. She has no wheezes. She has no rales.  Abdominal: Soft. Bowel sounds are normal. She exhibits no mass. There is no hepatosplenomegaly. There is no tenderness.  Musculoskeletal: Normal range of motion. She exhibits no edema, tenderness or deformity.  Lymphadenopathy:    She has no cervical adenopathy.  Neurological: She is alert and oriented to person, place, and time.  Skin: Skin is warm and dry. She is not diaphoretic. No pallor.  Psychiatric: She has a normal mood and affect. Her behavior is normal. Judgment and thought content normal.    Lab Results  Component Value Date   WBC 6.7 06/07/2018   HGB 15.9 (H) 06/07/2018   HCT 45.5 06/07/2018   PLT 253.0 06/07/2018   GLUCOSE 190 (H) 06/07/2018   CHOL 186 05/31/2017   TRIG 230.0 (H) 05/31/2017   HDL 50.00 05/31/2017   LDLDIRECT 106.0 05/31/2017   LDLCALC 124 (H) 05/11/2016   ALT 11 06/07/2018   AST 12 06/07/2018   NA 139 06/07/2018   K 3.8 06/07/2018   CL 100 06/07/2018   CREATININE 0.86 06/07/2018   BUN 15 06/07/2018   CO2 30 06/07/2018   TSH 1.01 06/07/2018   INR 1.33 07/25/2014   HGBA1C 7.1 (H) 06/07/2018   MICROALBUR 2.3 (H) 06/07/2018    Dg Chest Portable 1 View  Result Date: 11/28/2017 CLINICAL DATA:  Hypotension EXAM: PORTABLE CHEST 1 VIEW COMPARISON:  08/15/2017 FINDINGS: Emphysematous hyperinflation of lungs. Top-normal size heart with minimal aortic atherosclerosis. No aneurysm. No acute pulmonary consolidation, effusion or pneumothorax. No suspicious osseous abnormality. IMPRESSION:  Hyperinflated lungs without active pulmonary disease. Electronically Signed   By: Ashley Royalty M.D.   On: 11/28/2017 03:57    Assessment & Plan:   Joe was seen today for diabetes and hypertension.  Diagnoses and all orders for this visit:  Type II diabetes mellitus with manifestations ([place])- Her A1c is up to 7.1%.  Medical therapy is not indicated at this time.  She was encouraged to improve her lifestyle modifications.  I do not see anything in her lab work that explains her symptoms. -     Hemoglobin A1c; Future -     Microalbumin / creatinine urine ratio; Future -     Comprehensive metabolic panel; Future  Essential hypertension- Her blood pressure is well controlled.  Electrolytes and renal function are normal. -     TSH; Future -     Urinalysis,  Routine w reflex microscopic; Future -     Comprehensive metabolic panel; Future  Vitamin B12 deficiency anemia due to intrinsic factor deficiency- Her H&H are normal and her B12 and folate levels are normal.  She will continue the high-dose oral B12 supplement. -     CBC with Differential/Platelet; Future -     Vitamin B12; Future -     Folate; Future   I have discontinued Karen Chafe. Panning's mupirocin ointment. I am also having her maintain her traZODone, cyanocobalamin, Insulin Pen Needle, Glycopyrrolate, etodolac, pantoprazole, atorvastatin, nebivolol, telmisartan, LORazepam, potassium chloride SA, tizanidine, repaglinide, JANUVIA, SUMAtriptan, glucose blood, and Vilazodone HCl.  No orders of the defined types were placed in this encounter.    Follow-up: No follow-ups on file.  [name], MD

## 2018-06-08 ENCOUNTER — Encounter: Payer: Self-pay | Admitting: Internal Medicine

## 2018-06-08 NOTE — Patient Instructions (Signed)

## 2018-06-15 ENCOUNTER — Telehealth: Payer: Self-pay | Admitting: Internal Medicine

## 2018-06-15 NOTE — Telephone Encounter (Signed)
Copied from [place] [id number]. Topic: General - Other >> Jun 14, 2018  4:59 PM Mcneil, Ja-Kwan wrote: Reason for CRM: Pt states she had 3 falls in which she hit the back of her head. Pt stated she had the first fall a week ago and the next fall was 4 days ago and the latest fall was last night. Pt refused to schedule an appt at this time. Pt stated she stayed fully conscious during each fall and she just wanted to make Dr. Ronnald Ramp aware. Pt stated she would schedule an appt if Dr. Ronnald Ramp feels it is necessary.

## 2018-06-27 ENCOUNTER — Ambulatory Visit (INDEPENDENT_AMBULATORY_CARE_PROVIDER_SITE_OTHER)
Admission: RE | Admit: 2018-06-27 | Discharge: 2018-06-27 | Disposition: A | Discharge status: Home / Self Care | Payer: Medicare Other | Source: Ambulatory Visit | Attending: Internal Medicine | Admitting: Internal Medicine

## 2018-06-27 ENCOUNTER — Ambulatory Visit: Payer: Self-pay | Admitting: Internal Medicine

## 2018-06-27 ENCOUNTER — Ambulatory Visit: Payer: Medicare Other | Admitting: Internal Medicine

## 2018-06-27 ENCOUNTER — Encounter: Payer: Self-pay | Admitting: Internal Medicine

## 2018-06-27 VITALS — BP 200/100 | HR 80 | Temp 97.9°F | Ht 65.0 in | Wt 220.0 lb

## 2018-06-27 DIAGNOSIS — R05 Cough: Secondary | ICD-10-CM

## 2018-06-27 DIAGNOSIS — I10 Essential (primary) hypertension: Secondary | ICD-10-CM

## 2018-06-27 DIAGNOSIS — R059 Cough, unspecified: Secondary | ICD-10-CM

## 2018-06-27 DIAGNOSIS — R51 Headache: Secondary | ICD-10-CM

## 2018-06-27 DIAGNOSIS — J410 Simple chronic bronchitis: Secondary | ICD-10-CM

## 2018-06-27 DIAGNOSIS — R519 Headache, unspecified: Secondary | ICD-10-CM | POA: Insufficient documentation

## 2018-06-27 DIAGNOSIS — F333 Major depressive disorder, recurrent, severe with psychotic symptoms: Secondary | ICD-10-CM

## 2018-06-27 LAB — POCT EXHALED NITRIC OXIDE: FeNO level (ppb): 17

## 2018-06-27 MED ORDER — AZILSARTAN-CHLORTHALIDONE 40-25 MG PO TABS: 1.0000 | ORAL_TABLET | Freq: Every day | ORAL | 0 refills | Status: DC

## 2018-06-27 MED ORDER — HYDROCODONE-HOMATROPINE 5-1.5 MG/5ML PO SYRP: 5.0000 mL | ORAL_SOLUTION | Freq: Three times a day (TID) | ORAL | 0 refills | Status: AC | PRN

## 2018-06-27 MED ORDER — HYDROCODONE-HOMATROPINE 5-1.5 MG/5ML PO SYRP: 5.0000 mL | ORAL_SOLUTION | Freq: Three times a day (TID) | ORAL | 0 refills | Status: DC | PRN

## 2018-06-27 NOTE — Patient Instructions (Signed)

## 2018-06-27 NOTE — Progress Notes (Signed)
Subjective:  Patient ID: [name], female    DOB: [birth year]  Age: 72 y.o. MRN: [id number]  CC: Cough; Hypertension; and Headache   HPI [name] presents for f/up - She complains of a 3-week history of headache with visual hallucinations.  She states that on the right side she feels like she sees someone in a white coat and on the left side of her vision she feels like someone is throwing snowballs at her.  She denies nausea, vomiting, or paresthesias.  She also complains of a 2-week history of NP cough with temperature to 100.0 with chills, myalgias, and shortness of breath.  She denies night sweats, chest pain, or hemoptysis.  She is not taking the ARB that was prescribed months ago.  She tells me the only thing she is taking for her blood pressure is nebivolol.  Outpatient Medications Prior to Visit  Medication Sig Dispense Refill  . atorvastatin (LIPITOR) 20 MG tablet TAKE 1 TABLET BY MOUTH EVERY DAY 90 tablet 1  . cyanocobalamin 2000 MCG tablet Take 1 tablet (2,000 mcg total) by mouth daily. 90 tablet 3  . glucose blood (ONE TOUCH ULTRA TEST) test strip USE TO CHECK BLOOD SUGAR 3 TIMES DAILY 100 each 11  . Insulin Pen Needle (NOVOFINE) 32G X 6 MM MISC 1 Act by Does not apply route daily. 100 each 3  . JANUVIA 100 MG tablet TAKE 1 TABLET (100 MG TOTAL) BY MOUTH DAILY. 90 tablet 1  . LORazepam (ATIVAN) 1 MG tablet Take 0.5 tablets (0.5 mg total) by mouth every 8 (eight) hours as needed for anxiety. 30 tablet 4  . nebivolol (BYSTOLIC) 10 MG tablet Take 1 tablet (10 mg total) by mouth daily. 126 tablet 0  . pantoprazole (PROTONIX) 40 MG tablet Take 1 tablet (40 mg total) by mouth daily. (Patient taking differently: Take 40 mg by mouth daily as needed (reflux). ) 90 tablet 3  . repaglinide (PRANDIN) 2 MG tablet TAKE 1 TABLET (2 MG TOTAL) BY MOUTH 3 (THREE) TIMES DAILY BEFORE MEALS. 90 tablet 9  . SUMAtriptan (IMITREX) 50 MG tablet TAKE 1 TABLET BY MOUTH AS NEEDED FOR  MIGRAINE, CAN REPEAT IN 2 HOURS IF HEADACHE PERSISTS 10 tablet 5  . tizanidine (ZANAFLEX) 2 MG capsule TAKE ONE CAPSULE 3 TIMES A DAY 270 capsule 1  . traZODone (DESYREL) 50 MG tablet Take 100 mg by mouth at bedtime  3  . Vilazodone HCl (VIIBRYD) 40 MG TABS Take by mouth daily.    Marland Kitchen etodolac (LODINE XL) 400 MG 24 hr tablet Take 1 tablet (400 mg total) by mouth daily. 90 tablet 0  . Glycopyrrolate (LONHALA MAGNAIR REFILL KIT) 25 MCG/ML SOLN Inhale 1 puff into the lungs 2 (two) times daily. 60 mL 11  . potassium chloride SA (KLOR-CON M20) 20 MEQ tablet Take 1 tablet (20 mEq total) by mouth 2 (two) times daily. 180 tablet 1  . telmisartan (MICARDIS) 80 MG tablet Take 1 tablet (80 mg total) by mouth daily. 90 tablet 3   No facility-administered medications prior to visit.     ROS Review of Systems  Constitutional: Positive for chills and fever. Negative for diaphoresis and fatigue.  HENT: Negative.   Eyes: Negative.   Respiratory: Positive for cough and shortness of breath. Negative for chest tightness and wheezing.   Cardiovascular: Negative for chest pain, palpitations and leg swelling.  Gastrointestinal: Negative for abdominal pain, constipation, nausea and vomiting.  Endocrine: Negative.   Genitourinary: Negative.  Negative for difficulty urinating.  Musculoskeletal: Negative for back pain, myalgias and neck pain.  Skin: Negative.  Negative for color change, pallor and rash.  Neurological: Positive for headaches. Negative for dizziness, weakness and light-headedness.  Hematological: Negative for adenopathy. Does not bruise/bleed easily.  Psychiatric/Behavioral: Positive for decreased concentration, dysphoric mood, hallucinations and sleep disturbance. Negative for agitation, self-injury and suicidal ideas. The patient is nervous/anxious. The patient is not hyperactive.     Objective:  BP (!) 200/100 (BP Location: Left Arm, Patient Position: Sitting, Cuff Size: Large)   Pulse 80   Temp  97.9 F (36.6 C) (Oral)   Ht '5\' 5"'$  (1.651 m)   Wt 220 lb (99.8 kg)   SpO2 96%   BMI 36.61 kg/m   BP Readings from Last 3 Encounters:  06/27/18 (!) 200/100  06/07/18 (!) 144/80  05/10/18 128/88    Wt Readings from Last 3 Encounters:  06/27/18 220 lb (99.8 kg)  06/07/18 214 lb (97.1 kg)  05/10/18 212 lb (96.2 kg)    Physical Exam  Constitutional: She is oriented to person, place, and time.  Non-toxic appearance. She does not appear ill. No distress.  HENT:  Mouth/Throat: Oropharynx is clear and moist.  Eyes: Pupils are equal, round, and reactive to light. EOM are normal. Right eye exhibits normal extraocular motion and no nystagmus. Left eye exhibits normal extraocular motion and no nystagmus. Right pupil is reactive. Left pupil is reactive.  Neck: Normal range of motion. Neck supple. No JVD present. No neck rigidity.  Cardiovascular: Normal rate, regular rhythm and normal heart sounds.  Pulmonary/Chest: Effort normal and breath sounds normal. No stridor. No respiratory distress. She has no decreased breath sounds. She has no wheezes. She has no rhonchi. She has no rales.  Abdominal: Soft. Bowel sounds are normal. She exhibits no mass. There is no tenderness.  Musculoskeletal: Normal range of motion. She exhibits no edema, tenderness or deformity.  Neurological: She is alert and oriented to person, place, and time. She displays normal reflexes. No cranial nerve deficit or sensory deficit. She exhibits normal muscle tone. Coordination normal.  Skin: Skin is warm and dry. No pallor.  Psychiatric: Her behavior is normal. Thought content normal. Her mood appears anxious. Her speech is tangential. Her speech is not rapid and/or pressured, not delayed and not slurred. She is not agitated, not slowed and not withdrawn. Thought content is not paranoid. Cognition and memory are normal. Cognition and memory are not impaired. She exhibits a depressed mood. She expresses no homicidal and no  suicidal ideation. She is communicative. She exhibits normal recent memory and normal remote memory.  ++ tearful  Vitals reviewed.   Lab Results  Component Value Date   WBC 6.7 06/07/2018   HGB 15.9 (H) 06/07/2018   HCT 45.5 06/07/2018   PLT 253.0 06/07/2018   GLUCOSE 190 (H) 06/07/2018   CHOL 186 05/31/2017   TRIG 230.0 (H) 05/31/2017   HDL 50.00 05/31/2017   LDLDIRECT 106.0 05/31/2017   LDLCALC 124 (H) 05/11/2016   ALT 11 06/07/2018   AST 12 06/07/2018   NA 139 06/07/2018   K 3.8 06/07/2018   CL 100 06/07/2018   CREATININE 0.86 06/07/2018   BUN 15 06/07/2018   CO2 30 06/07/2018   TSH 1.01 06/07/2018   INR 1.33 07/25/2014   HGBA1C 7.1 (H) 06/07/2018   MICROALBUR 2.3 (H) 06/07/2018    Dg Chest Portable 1 View  Result Date: 11/28/2017 CLINICAL DATA:  Hypotension EXAM: PORTABLE CHEST 1 VIEW  COMPARISON:  08/15/2017 FINDINGS: Emphysematous hyperinflation of lungs. Top-normal size heart with minimal aortic atherosclerosis. No aneurysm. No acute pulmonary consolidation, effusion or pneumothorax. No suspicious osseous abnormality. IMPRESSION: Hyperinflated lungs without active pulmonary disease. Electronically Signed   By: Ashley Royalty M.D.   On: 11/28/2017 03:57    Dg Chest 2 View  Result Date: 06/27/2018 CLINICAL DATA:  Cough over the last 2 weeks EXAM: CHEST - 2 VIEW COMPARISON:  Portable chest x-ray of 11/28/2016 FINDINGS: No active infiltrate or effusion is seen. There is mild peribronchial thickening which can be seen with bronchitis. Mediastinal and hilar contours are unremarkable and the heart is within upper limits normal. No acute bony abnormality is seen. IMPRESSION: No pneumonia or effusion. Peribronchial thickening may indicate bronchitis. Electronically Signed   By: Ivar Drape M.D.   On: 06/27/2018 11:50   Ct Head Wo Contrast  Result Date: 06/27/2018 CLINICAL DATA:  Recent falls with headaches, initial encounter EXAM: CT HEAD WITHOUT CONTRAST TECHNIQUE: Contiguous  axial images were obtained from the base of the skull through the vertex without intravenous contrast. COMPARISON:  07/23/2014 FINDINGS: Brain: Right ventricular shunt catheter is again identified. The ventricles are slightly smaller in size when compared with the prior exam. Stable mild prominence of the CSF space along the convexities is noted. Mild chronic white matter ischemic change is seen. No findings to suggest acute hemorrhage, acute infarction or space-occupying mass lesion are noted. Vascular: No hyperdense vessel or unexpected calcification. Skull: Normal. Negative for fracture or focal lesion. Sinuses/Orbits: No acute finding. Other: None. IMPRESSION: Stable right ventricular shunt catheter. Ventricles are slightly smaller than that seen on the prior exam. No new focal abnormality is noted. Electronically Signed   By: Inez Catalina M.D.   On: 06/27/2018 14:01    Assessment & Plan:   [name] was seen today for cough, hypertension and headache.  Diagnoses and all orders for this visit:  Cough-chest x-ray is negative for edema, mass, or infiltrate. -     DG Chest 2 View; Future -     POCT EXHALED NITRIC OXIDE -     Discontinue: HYDROcodone-homatropine (HYCODAN) 5-1.5 MG/5ML syrup; Take 5 mLs by mouth every 8 (eight) hours as needed for up to 7 days for cough. -     HYDROcodone-homatropine (HYCODAN) 5-1.5 MG/5ML syrup; Take 5 mLs by mouth every 8 (eight) hours as needed for up to 7 days for cough.  Intractable episodic headache, unspecified headache type-CT of the head shows that the shunt is in place, the size of her ventricles is diminishing, there is no acute abnormal finding. -     CT Head Wo Contrast; Future  Essential hypertension- Her blood pressure is not well controlled and she is symptomatic.  She was not willing to be admitted for control of her hypertension.  She will continue the nebivolol.  I have asked her to restart an ARB and a thiazide diuretic as well.  She was given samples  of Edarbyclor and she took a dose here in the office. -     Azilsartan-Chlorthalidone (EDARBYCLOR) 40-25 MG TABS; Take 1 tablet by mouth daily.  Simple chronic bronchitis ([place])- She is not willing to use a LAMA nebulizer to treat this.  Will control the symptoms with Hycodan as needed. -     Discontinue: HYDROcodone-homatropine (HYCODAN) 5-1.5 MG/5ML syrup; Take 5 mLs by mouth every 8 (eight) hours as needed for up to 7 days for cough. -     HYDROcodone-homatropine (HYCODAN) 5-1.5 MG/5ML  syrup; Take 5 mLs by mouth every 8 (eight) hours as needed for up to 7 days for cough.  Severe episode of recurrent major depressive disorder, with psychotic features ([place])- I have asked her to add Abilify to the Viibryd to help treat the symptoms. -     ARIPiprazole (ABILIFY) 2 MG tablet; Take 1 tablet (2 mg total) by mouth daily.   I have discontinued Clemon Chambers Glycopyrrolate, etodolac, and telmisartan. I am also having her start on Azilsartan-Chlorthalidone and ARIPiprazole. Additionally, I am having her maintain her traZODone, cyanocobalamin, Insulin Pen Needle, pantoprazole, atorvastatin, nebivolol, LORazepam, potassium chloride SA, tizanidine, repaglinide, JANUVIA, SUMAtriptan, glucose blood, Vilazodone HCl, and HYDROcodone-homatropine.  Meds ordered this encounter  Medications  . Azilsartan-Chlorthalidone (EDARBYCLOR) 40-25 MG TABS    Sig: Take 1 tablet by mouth daily.    Dispense:  70 tablet    Refill:  0  . DISCONTD: HYDROcodone-homatropine (HYCODAN) 5-1.5 MG/5ML syrup    Sig: Take 5 mLs by mouth every 8 (eight) hours as needed for up to 7 days for cough.    Dispense:  120 mL    Refill:  0  . HYDROcodone-homatropine (HYCODAN) 5-1.5 MG/5ML syrup    Sig: Take 5 mLs by mouth every 8 (eight) hours as needed for up to 7 days for cough.    Dispense:  120 mL    Refill:  0  . ARIPiprazole (ABILIFY) 2 MG tablet    Sig: Take 1 tablet (2 mg total) by mouth daily.    Dispense:  90 tablet     Refill:  0     Follow-up: Return in about 1 week (around 07/04/2018).  [name], MD

## 2018-06-28 MED ORDER — GLYCOPYRROLATE 25 MCG/ML IN SOLN: 1.0000 | Freq: Two times a day (BID) | RESPIRATORY_TRACT | 11 refills | Status: DC

## 2018-06-28 MED ORDER — ARIPIPRAZOLE 2 MG PO TABS: 2.0000 mg | ORAL_TABLET | Freq: Every day | ORAL | 0 refills | Status: DC

## 2018-06-28 NOTE — Addendum Note (Signed)
Addended by: Janith Lima on: 06/28/2018 10:40 AM   Modules accepted: Orders

## 2018-07-03 ENCOUNTER — Ambulatory Visit: Payer: Self-pay

## 2018-07-04 ENCOUNTER — Ambulatory Visit: Payer: Medicare Other | Admitting: Internal Medicine

## 2018-07-04 ENCOUNTER — Other Ambulatory Visit (INDEPENDENT_AMBULATORY_CARE_PROVIDER_SITE_OTHER): Payer: Medicare Other

## 2018-07-04 ENCOUNTER — Encounter: Payer: Self-pay | Admitting: Internal Medicine

## 2018-07-04 VITALS — BP 140/84 | HR 93 | Temp 98.0°F | Ht 65.0 in | Wt 211.2 lb

## 2018-07-04 DIAGNOSIS — E785 Hyperlipidemia, unspecified: Secondary | ICD-10-CM

## 2018-07-04 DIAGNOSIS — I10 Essential (primary) hypertension: Secondary | ICD-10-CM | POA: Diagnosis not present

## 2018-07-04 DIAGNOSIS — G910 Communicating hydrocephalus: Secondary | ICD-10-CM

## 2018-07-04 DIAGNOSIS — R296 Repeated falls: Secondary | ICD-10-CM | POA: Diagnosis not present

## 2018-07-04 LAB — LIPID PANEL
Cholesterol: 257 mg/dL — ABNORMAL HIGH (ref 0–200)
HDL: 60.6 mg/dL (ref >39.00)
NonHDL: 196.54
Total CHOL/HDL Ratio: 4
Triglycerides: 293 mg/dL — ABNORMAL HIGH (ref 0.0–149.0)
VLDL: 58.6 mg/dL — ABNORMAL HIGH (ref 0.0–40.0)

## 2018-07-04 LAB — LDL CHOLESTEROL, DIRECT: Direct LDL: 154 mg/dL

## 2018-07-04 MED ORDER — ATORVASTATIN CALCIUM 40 MG PO TABS: 40.0000 mg | ORAL_TABLET | Freq: Every day | ORAL | 1 refills | Status: DC

## 2018-07-04 NOTE — Patient Instructions (Signed)

## 2018-07-04 NOTE — Progress Notes (Signed)
Subjective:  Patient ID: [name], female    DOB: 12-05-1945  Age: 72 y.o. MRN: [id number]  CC: Hypertension; Hyperlipidemia; and Diabetes   HPI [name] presents for f/up - She complains that she is falling nearly every day.  She has a sensation that she is going to lose her balance and fall backwards.  She has not injured herself within year for falls.  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  Outpatient Medications Prior to Visit  Medication Sig Dispense Refill  . ARIPiprazole (ABILIFY) 2 MG tablet Take 1 tablet (2 mg total) by mouth daily. 90 tablet 0  . Azilsartan-Chlorthalidone (EDARBYCLOR) 40-25 MG TABS Take 1 tablet by mouth daily. 70 tablet 0  . cyanocobalamin 2000 MCG tablet Take 1 tablet (2,000 mcg total) by mouth daily. 90 tablet 3  . glucose blood (ONE TOUCH ULTRA TEST) test strip USE TO CHECK BLOOD SUGAR 3 TIMES DAILY 100 each 11  . Glycopyrrolate (LONHALA MAGNAIR REFILL KIT) 25 MCG/ML SOLN Inhale 1 puff into the lungs 2 (two) times daily. 60 mL 11  . Glycopyrrolate (LONHALA MAGNAIR STARTER KIT) 25 MCG/ML SOLN Inhale 1 Act into the lungs 2 (two) times daily. 60 mL 11  . HYDROcodone-homatropine (HYCODAN) 5-1.5 MG/5ML syrup Take 5 mLs by mouth every 8 (eight) hours as needed for up to 7 days for cough. 120 mL 0  . Insulin Pen Needle (NOVOFINE) 32G X 6 MM MISC 1 Act by Does not apply route daily. 100 each 3  . JANUVIA 100 MG tablet TAKE 1 TABLET (100 MG TOTAL) BY MOUTH DAILY. 90 tablet 1  . LORazepam (ATIVAN) 1 MG tablet Take 0.5 tablets (0.5 mg total) by mouth every 8 (eight) hours as needed for anxiety. 30 tablet 4  . nebivolol (BYSTOLIC) 10 MG tablet Take 1 tablet (10 mg total) by mouth daily. 126 tablet 0  . pantoprazole (PROTONIX) 40 MG tablet Take 1 tablet (40 mg total) by mouth daily. (Patient taking differently: Take 40 mg by mouth daily as needed (reflux). ) 90 tablet 3  . repaglinide (PRANDIN) 2 MG tablet TAKE 1 TABLET (2  MG TOTAL) BY MOUTH 3 (THREE) TIMES DAILY BEFORE MEALS. 90 tablet 9  . SUMAtriptan (IMITREX) 50 MG tablet TAKE 1 TABLET BY MOUTH AS NEEDED FOR MIGRAINE, CAN REPEAT IN 2 HOURS IF HEADACHE PERSISTS 10 tablet 5  . tizanidine (ZANAFLEX) 2 MG capsule TAKE ONE CAPSULE 3 TIMES A DAY 270 capsule 1  . traZODone (DESYREL) 50 MG tablet Take 100 mg by mouth at bedtime  3  . Vilazodone HCl (VIIBRYD) 40 MG TABS Take by mouth daily.    Marland Kitchen atorvastatin (LIPITOR) 20 MG tablet TAKE 1 TABLET BY MOUTH EVERY DAY 90 tablet 1  . potassium chloride SA (KLOR-CON M20) 20 MEQ tablet Take 1 tablet (20 mEq total) by mouth 2 (two) times daily. 180 tablet 1   No facility-administered medications prior to visit.     ROS Review of Systems  Constitutional: Negative.   HENT: Negative.   Respiratory: Positive for cough. Negative for chest tightness and shortness of breath.   Cardiovascular: Negative for chest pain, palpitations and leg swelling.  Gastrointestinal: Negative for abdominal pain, constipation, diarrhea, nausea and vomiting.  Endocrine: Negative.   Genitourinary: Negative.  Negative for difficulty urinating.  Musculoskeletal: Positive for arthralgias (knees). Negative for myalgias and neck pain.  Skin: Negative.   Neurological: Negative for dizziness, syncope, weakness and light-headedness.  Hematological: Negative for adenopathy.  Does not bruise/bleed easily.  Psychiatric/Behavioral: Negative.     Objective:  BP 140/84 (BP Location: Left Arm, Patient Position: Sitting, Cuff Size: Large)   Pulse 93   Temp 98 F (36.7 C) (Oral)   Ht _0  (1.651 m)   Wt 211 lb 4 oz (95.8 kg)   SpO2 92%   BMI 35.15 kg/m   BP Readings from Last 3 Encounters:  07/04/18 140/84  06/27/18 (!) 200/100  06/07/18 (!) 144/80    Wt Readings from Last 3 Encounters:  07/04/18 211 lb 4 oz (95.8 kg)  06/27/18 220 lb (99.8 kg)  06/07/18 214 lb (97.1 kg)    Physical Exam  Constitutional: She is oriented to person, place, and  time. No distress.  HENT:  Mouth/Throat: Oropharynx is clear and moist. No oropharyngeal exudate.  Eyes: Conjunctivae are normal. No scleral icterus.  Neck: Normal range of motion. Neck supple. No JVD present. No thyromegaly present.  Cardiovascular: Normal rate, regular rhythm and normal heart sounds.  Pulmonary/Chest: Effort normal and breath sounds normal. She has no wheezes. She has no rales.  Abdominal: Soft. Bowel sounds are normal. She exhibits no mass. There is no hepatosplenomegaly. There is no tenderness.  Musculoskeletal: Normal range of motion. She exhibits no edema, tenderness or deformity.  Lymphadenopathy:    She has no cervical adenopathy.  Neurological: She is alert and oriented to person, place, and time.  Skin: Skin is warm and dry. She is not diaphoretic. No pallor.  Vitals reviewed.   Lab Results  Component Value Date   WBC 6.7 06/07/2018   HGB 15.9 (H) 06/07/2018   HCT 45.5 06/07/2018   PLT 253.0 06/07/2018   GLUCOSE 190 (H) 06/07/2018   CHOL 257 (H) 07/04/2018   TRIG 293.0 (H) 07/04/2018   HDL 60.60 07/04/2018   LDLDIRECT 154.0 07/04/2018   LDLCALC 124 (H) 05/11/2016   ALT 11 06/07/2018   AST 12 06/07/2018   NA 139 06/07/2018   K 3.8 06/07/2018   CL 100 06/07/2018   CREATININE 0.86 06/07/2018   BUN 15 06/07/2018   CO2 30 06/07/2018   TSH 1.01 06/07/2018   INR 1.33 07/25/2014   HGBA1C 7.1 (H) 06/07/2018   MICROALBUR 2.3 (H) 06/07/2018    Dg Chest 2 View  Result Date: 06/27/2018 CLINICAL DATA:  Cough over the last 2 weeks EXAM: CHEST - 2 VIEW COMPARISON:  Portable chest x-ray of 11/28/2016 FINDINGS: No active infiltrate or effusion is seen. There is mild peribronchial thickening which can be seen with bronchitis. Mediastinal and hilar contours are unremarkable and the heart is within upper limits normal. No acute bony abnormality is seen. IMPRESSION: No pneumonia or effusion. Peribronchial thickening may indicate bronchitis. Electronically Signed    By: Ivar Drape M.D.   On: 06/27/2018 11:50   Ct Head Wo Contrast  Result Date: 06/27/2018 CLINICAL DATA:  Recent falls with headaches, initial encounter EXAM: CT HEAD WITHOUT CONTRAST TECHNIQUE: Contiguous axial images were obtained from the base of the skull through the vertex without intravenous contrast. COMPARISON:  07/23/2014 FINDINGS: Brain: Right ventricular shunt catheter is again identified. The ventricles are slightly smaller in size when compared with the prior exam. Stable mild prominence of the CSF space along the convexities is noted. Mild chronic white matter ischemic change is seen. No findings to suggest acute hemorrhage, acute infarction or space-occupying mass lesion are noted. Vascular: No hyperdense vessel or unexpected calcification. Skull: Normal. Negative for fracture or focal lesion. Sinuses/Orbits: No acute finding. Other:  None. IMPRESSION: Stable right ventricular shunt catheter. Ventricles are slightly smaller than that seen on the prior exam. No new focal abnormality is noted. Electronically Signed   By: Inez Catalina M.D.   On: 06/27/2018 14:01    Assessment & Plan:   Simonne was seen today for hypertension, hyperlipidemia and diabetes.  Diagnoses and all orders for this visit:  Hyperlipidemia with target LDL less than 100- She has not achieved her LDL goal and she has an elevated ASCVD risk score.  According to her prescription refills she would have run out of her statin about a month ago.  I have asked her to restart a statin for CV risk reduction. -     Lipid panel; Future -     atorvastatin (LIPITOR) 40 MG tablet; Take 1 tablet (40 mg total) by mouth daily.  HYDROCEPHALUS, NORMAL PRESSURE- She will follow-up with her neurosurgeon. -     Ambulatory referral to Neurosurgery  Falls frequently- I have sent a referral for home health care to help her prevent falls. -     Ambulatory referral to [place] hypertension- Her blood pressure is adequately  well controlled.   I have discontinued Karen Chafe. Strnad's atorvastatin. I am also having her start on atorvastatin. Additionally, I am having her maintain her traZODone, cyanocobalamin, Insulin Pen Needle, pantoprazole, nebivolol, LORazepam, potassium chloride SA, tizanidine, repaglinide, JANUVIA, SUMAtriptan, glucose blood, Vilazodone HCl, Azilsartan-Chlorthalidone, HYDROcodone-homatropine, ARIPiprazole, Glycopyrrolate, and Glycopyrrolate.  Meds ordered this encounter  Medications  . atorvastatin (LIPITOR) 40 MG tablet    Sig: Take 1 tablet (40 mg total) by mouth daily.    Dispense:  90 tablet    Refill:  1     Follow-up: Return in about 4 months (around 11/03/2018).  [name], MD

## 2018-07-06 LAB — HM DIABETES EYE EXAM

## 2018-07-07 ENCOUNTER — Telehealth: Payer: Self-pay | Admitting: Internal Medicine

## 2018-07-07 NOTE — Telephone Encounter (Signed)
Copied from [place] [id number]. Topic: Quick Communication - See Telephone Encounter >> Jul 07, 2018 12:44 PM Antonieta Iba C wrote: CRM for notification. See Telephone encounter for: 07/07/18.  Shree - PTAlvis Lemmings [phone]   Frequency: 1 week 4   Would also like to add on orders to have social worker and nursing services.

## 2018-07-10 NOTE — Telephone Encounter (Signed)
Okay for verbal orders. 

## 2018-07-10 NOTE — Telephone Encounter (Signed)
yes

## 2018-07-10 NOTE — Telephone Encounter (Signed)
Spoke with [name] and gave verbal orders per MD

## 2018-07-11 ENCOUNTER — Encounter: Payer: Self-pay | Admitting: Internal Medicine

## 2018-07-11 ENCOUNTER — Ambulatory Visit: Payer: Self-pay | Admitting: Internal Medicine

## 2018-07-11 ENCOUNTER — Other Ambulatory Visit (INDEPENDENT_AMBULATORY_CARE_PROVIDER_SITE_OTHER): Payer: Medicare Other

## 2018-07-11 ENCOUNTER — Ambulatory Visit (INDEPENDENT_AMBULATORY_CARE_PROVIDER_SITE_OTHER): Payer: Medicare Other | Admitting: Internal Medicine

## 2018-07-11 ENCOUNTER — Ambulatory Visit (INDEPENDENT_AMBULATORY_CARE_PROVIDER_SITE_OTHER)
Admission: RE | Admit: 2018-07-11 | Discharge: 2018-07-11 | Disposition: A | Discharge status: Home / Self Care | Payer: Medicare Other | Source: Ambulatory Visit | Attending: Internal Medicine | Admitting: Internal Medicine

## 2018-07-11 VITALS — BP 140/80 | HR 75 | Temp 98.0°F | Resp 16 | Ht 65.0 in | Wt 215.0 lb

## 2018-07-11 DIAGNOSIS — R0789 Other chest pain: Secondary | ICD-10-CM

## 2018-07-11 DIAGNOSIS — I10 Essential (primary) hypertension: Secondary | ICD-10-CM

## 2018-07-11 LAB — TROPONIN I: TNIDX: 0 ug/l (ref 0.00–0.06)

## 2018-07-11 LAB — D-DIMER, QUANTITATIVE: D-Dimer, Quant: 0.28 mcg/mL FEU (ref <0.50)

## 2018-07-11 NOTE — Patient Instructions (Signed)
Chest Wall Pain °Chest wall pain is pain in or around the bones and muscles of your chest. Sometimes, an injury causes this pain. Sometimes, the cause may not be known. This pain may take several weeks or longer to get better. °Follow these instructions at home: °Pay attention to any changes in your symptoms. Take these actions to help with your pain: °· Rest as told by your health care provider. °· Avoid activities that cause pain. These include any activities that use your chest muscles or your abdominal and side muscles to lift heavy items. °· If directed, apply ice to the painful area: °? Put ice in a plastic bag. °? Place a towel between your skin and the bag. °? Leave the ice on for 20 minutes, 2-3 times per day. °· Take over-the-counter and prescription medicines only as told by your health care provider. °· Do not use tobacco products, including cigarettes, chewing tobacco, and e-cigarettes. If you need help quitting, ask your health care provider. °· Keep all follow-up visits as told by your health care provider. This is important. ° °Contact a health care provider if: °· You have a fever. °· Your chest pain becomes worse. °· You have new symptoms. °Get help right away if: °· You have nausea or vomiting. °· You feel sweaty or light-headed. °· You have a cough with phlegm (sputum) or you cough up blood. °· You develop shortness of breath. °This information is not intended to replace advice given to you by your health care provider. Make sure you discuss any questions you have with your health care provider. °Document Released: 07/19/2005 Document Revised: 11/27/2015 Document Reviewed: 10/14/2014 °Elsevier Interactive Patient Education © 2018 [employer] ° °

## 2018-07-11 NOTE — Progress Notes (Signed)
Subjective:  Patient ID: [name], female    DOB: 03/28/1946  Age: 72 y.o. MRN: [id number]  CC: Chest Pain   HPI Kiara DELPOZO presents for f/up - She complains of a one-week history of diffuse right breast pain.  She also tells me that 1 day prior to this visit she fell in her home and injured the front of her chest on a dresser.  Since that injury she has had mild diffuse anterior chest wall pain. She describes the diffuse chest pain as a pressure.  Outpatient Medications Prior to Visit  Medication Sig Dispense Refill  . ARIPiprazole (ABILIFY) 2 MG tablet Take 1 tablet (2 mg total) by mouth daily. 90 tablet 0  . atorvastatin (LIPITOR) 40 MG tablet Take 1 tablet (40 mg total) by mouth daily. 90 tablet 1  . Azilsartan-Chlorthalidone (EDARBYCLOR) 40-25 MG TABS Take 1 tablet by mouth daily. 70 tablet 0  . cyanocobalamin 2000 MCG tablet Take 1 tablet (2,000 mcg total) by mouth daily. 90 tablet 3  . glucose blood (ONE TOUCH ULTRA TEST) test strip USE TO CHECK BLOOD SUGAR 3 TIMES DAILY 100 each 11  . Glycopyrrolate (LONHALA MAGNAIR REFILL KIT) 25 MCG/ML SOLN Inhale 1 puff into the lungs 2 (two) times daily. 60 mL 11  . Glycopyrrolate (LONHALA MAGNAIR STARTER KIT) 25 MCG/ML SOLN Inhale 1 Act into the lungs 2 (two) times daily. 60 mL 11  . Insulin Pen Needle (NOVOFINE) 32G X 6 MM MISC 1 Act by Does not apply route daily. 100 each 3  . JANUVIA 100 MG tablet TAKE 1 TABLET (100 MG TOTAL) BY MOUTH DAILY. 90 tablet 1  . LORazepam (ATIVAN) 1 MG tablet Take 0.5 tablets (0.5 mg total) by mouth every 8 (eight) hours as needed for anxiety. 30 tablet 4  . nebivolol (BYSTOLIC) 10 MG tablet Take 1 tablet (10 mg total) by mouth daily. 126 tablet 0  . pantoprazole (PROTONIX) 40 MG tablet Take 1 tablet (40 mg total) by mouth daily. (Patient taking differently: Take 40 mg by mouth daily as needed (reflux). ) 90 tablet 3  . repaglinide (PRANDIN) 2 MG tablet TAKE 1 TABLET (2 MG TOTAL) BY MOUTH 3  (THREE) TIMES DAILY BEFORE MEALS. 90 tablet 9  . SUMAtriptan (IMITREX) 50 MG tablet TAKE 1 TABLET BY MOUTH AS NEEDED FOR MIGRAINE, CAN REPEAT IN 2 HOURS IF HEADACHE PERSISTS 10 tablet 5  . tizanidine (ZANAFLEX) 2 MG capsule TAKE ONE CAPSULE 3 TIMES A DAY 270 capsule 1  . traZODone (DESYREL) 50 MG tablet Take 100 mg by mouth at bedtime  3  . Vilazodone HCl (VIIBRYD) 40 MG TABS Take by mouth daily.    . potassium chloride SA (KLOR-CON M20) 20 MEQ tablet Take 1 tablet (20 mEq total) by mouth 2 (two) times daily. 180 tablet 1   No facility-administered medications prior to visit.     ROS Review of Systems  Constitutional: Negative.  Negative for diaphoresis, fatigue and unexpected weight change.  HENT: Negative.  Negative for trouble swallowing.   Eyes: Negative.   Respiratory: Negative for cough, chest tightness, shortness of breath and wheezing.   Cardiovascular: Positive for chest pain. Negative for palpitations and leg swelling.  Gastrointestinal: Negative for abdominal pain, constipation, diarrhea, nausea and vomiting.  Genitourinary: Negative.   Musculoskeletal: Negative for back pain and neck pain.  Skin: Negative.   Neurological: Negative.  Negative for dizziness, weakness and light-headedness.  Hematological: Negative for adenopathy. Does not bruise/bleed easily.  Psychiatric/Behavioral:  Negative.     Objective:  BP 140/80 (BP Location: Left Arm, Patient Position: Sitting, Cuff Size: Large)   Pulse 75   Temp 98 F (36.7 C) (Oral)   Resp 16   Ht '5\' 5"'$  (1.651 m)   Wt 215 lb (97.5 kg)   SpO2 97%   BMI 35.78 kg/m   BP Readings from Last 3 Encounters:  07/11/18 140/80  07/04/18 140/84  06/27/18 (!) 200/100    Wt Readings from Last 3 Encounters:  07/11/18 215 lb (97.5 kg)  07/04/18 211 lb 4 oz (95.8 kg)  06/27/18 220 lb (99.8 kg)    Physical Exam Vitals signs reviewed. Exam conducted with a chaperone present.  Constitutional:      General: She is not in acute  distress.    Appearance: She is not diaphoretic.  HENT:     Mouth/Throat:     Mouth: Oropharynx is clear and moist.     Pharynx: No oropharyngeal exudate.  Eyes:     General: No scleral icterus.    Conjunctiva/sclera: Conjunctivae normal.  Neck:     Musculoskeletal: Normal range of motion and neck supple.     Thyroid: No thyromegaly.     Vascular: No JVD.  Cardiovascular:     Rate and Rhythm: Normal rate and regular rhythm.     Heart sounds: Normal heart sounds, S1 normal and S2 normal. No murmur. No friction rub. No gallop.      Comments: EKG ---  Sinus  Rhythm  - frequent PAC s  # PACs = 2. Voltage criteria for LVH  (R(I)+S(III) exceeds 2.50 mV)  -Voltage criteria w/o ST/T abnormality may be normal.   BORDERLINE- no change from the prior EKG Pulmonary:     Effort: Pulmonary effort is normal. No respiratory distress.     Breath sounds: Normal breath sounds. No wheezing or rales.  Chest:     Chest wall: No mass, deformity, swelling, tenderness, crepitus or edema.     Breasts: No discharge from either breast. No breast swelling, tenderness and bleeding. Breasts are symmetrical.        Right: No inverted nipple, mass, nipple discharge, skin change or tenderness.        Left: No inverted nipple, mass, nipple discharge, skin change or tenderness.  Abdominal:     General: Bowel sounds are normal. There is no distension.     Palpations: Abdomen is soft. There is no mass.     Tenderness: There is no abdominal tenderness. There is no guarding.  Musculoskeletal: Normal range of motion.        General: No tenderness, deformity or edema.  Skin:    General: Skin is warm and dry.     Coloration: Skin is not pale.     Findings: No erythema or rash.  Neurological:     Mental Status: She is alert and oriented to person, place, and time.     Lab Results  Component Value Date   WBC 6.7 06/07/2018   HGB 15.9 (H) 06/07/2018   HCT 45.5 06/07/2018   PLT 253.0 06/07/2018   GLUCOSE 190  (H) 06/07/2018   CHOL 257 (H) 07/04/2018   TRIG 293.0 (H) 07/04/2018   HDL 60.60 07/04/2018   LDLDIRECT 154.0 07/04/2018   LDLCALC 124 (H) 05/11/2016   ALT 11 06/07/2018   AST 12 06/07/2018   NA 139 06/07/2018   K 3.8 06/07/2018   CL 100 06/07/2018   CREATININE 0.86 06/07/2018   BUN 15 06/07/2018  CO2 30 06/07/2018   TSH 1.01 06/07/2018   INR 1.33 07/25/2014   HGBA1C 7.1 (H) 06/07/2018   MICROALBUR 2.3 (H) 06/07/2018    Dg Chest 2 View  Result Date: 06/27/2018 CLINICAL DATA:  Cough over the last 2 weeks EXAM: CHEST - 2 VIEW COMPARISON:  Portable chest x-ray of 11/28/2016 FINDINGS: No active infiltrate or effusion is seen. There is mild peribronchial thickening which can be seen with bronchitis. Mediastinal and hilar contours are unremarkable and the heart is within upper limits normal. No acute bony abnormality is seen. IMPRESSION: No pneumonia or effusion. Peribronchial thickening may indicate bronchitis. Electronically Signed   By: Ivar Drape M.D.   On: 06/27/2018 11:50   Ct Head Wo Contrast  Result Date: 06/27/2018 CLINICAL DATA:  Recent falls with headaches, initial encounter EXAM: CT HEAD WITHOUT CONTRAST TECHNIQUE: Contiguous axial images were obtained from the base of the skull through the vertex without intravenous contrast. COMPARISON:  07/23/2014 FINDINGS: Brain: Right ventricular shunt catheter is again identified. The ventricles are slightly smaller in size when compared with the prior exam. Stable mild prominence of the CSF space along the convexities is noted. Mild chronic white matter ischemic change is seen. No findings to suggest acute hemorrhage, acute infarction or space-occupying mass lesion are noted. Vascular: No hyperdense vessel or unexpected calcification. Skull: Normal. Negative for fracture or focal lesion. Sinuses/Orbits: No acute finding. Other: None. IMPRESSION: Stable right ventricular shunt catheter. Ventricles are slightly smaller than that seen on the  prior exam. No new focal abnormality is noted. Electronically Signed   By: Inez Catalina M.D.   On: 06/27/2018 14:01    Assessment & Plan:   Zoila was seen today for chest pain.  Diagnoses and all orders for this visit:  Chest pressure- Her EKG is unchanged compared to her prior EKG and is not concerning for ischemia.  Her troponin and d-dimer are normal.  Chest x-ray is negative for any acute injury.  Examination of the right breast is normal.  I offered her reassurance and recommended that she take over-the-counter doses of ibuprofen and Tylenol as needed. -     EKG 12-Lead -     Troponin I -; Future -     D-dimer, quantitative (not at [facility]); Future -     DG Chest 2 View; Future  Essential hypertension- Her blood pressure is adequately well controlled.   I am having [name] maintain her traZODone, cyanocobalamin, Insulin Pen Needle, pantoprazole, nebivolol, LORazepam, potassium chloride SA, tizanidine, repaglinide, JANUVIA, SUMAtriptan, glucose blood, Vilazodone HCl, Azilsartan-Chlorthalidone, ARIPiprazole, Glycopyrrolate, Glycopyrrolate, and atorvastatin.  No orders of the defined types were placed in this encounter.    Follow-up: Return if symptoms worsen or fail to improve.  [name], MD

## 2018-07-17 ENCOUNTER — Emergency Department (HOSPITAL_COMMUNITY)
Admission: EM | Admit: 2018-07-17 | Discharge: 2018-07-18 | Disposition: A | Discharge status: Home / Self Care | Payer: Medicare Other | Source: Home / Self Care

## 2018-07-17 ENCOUNTER — Encounter (HOSPITAL_COMMUNITY): Payer: Self-pay | Admitting: Emergency Medicine

## 2018-07-17 DIAGNOSIS — R1110 Vomiting, unspecified: Secondary | ICD-10-CM | POA: Insufficient documentation

## 2018-07-17 DIAGNOSIS — I9589 Other hypotension: Secondary | ICD-10-CM | POA: Diagnosis not present

## 2018-07-17 DIAGNOSIS — Z5321 Procedure and treatment not carried out due to patient leaving prior to being seen by health care provider: Secondary | ICD-10-CM

## 2018-07-17 LAB — URINALYSIS, ROUTINE W REFLEX MICROSCOPIC
Bilirubin Urine: NEGATIVE
Glucose, UA: NEGATIVE mg/dL
Hgb urine dipstick: NEGATIVE
Ketones, ur: 5 mg/dL — AB
Nitrite: NEGATIVE
Protein, ur: NEGATIVE mg/dL
Specific Gravity, Urine: 1.017 (ref 1.005–1.030)
WBC, UA: >50 WBC/hpf — ABNORMAL HIGH (ref 0–5)
pH: 5 (ref 5.0–8.0)

## 2018-07-17 LAB — CBC
HCT: 43.4 % (ref 36.0–46.0)
Hemoglobin: 15 g/dL (ref 12.0–15.0)
MCH: 31.3 pg (ref 26.0–34.0)
MCHC: 34.6 g/dL (ref 30.0–36.0)
MCV: 90.4 fL (ref 80.0–100.0)
Platelets: 195 10*3/uL (ref 150–400)
RBC: 4.8 MIL/uL (ref 3.87–5.11)
RDW: 12.2 % (ref 11.5–15.5)
WBC: 7.2 10*3/uL (ref 4.0–10.5)
nRBC: 0 % (ref 0.0–0.2)

## 2018-07-17 MED ORDER — ONDANSETRON 4 MG PO TBDP
4.0000 mg | ORAL_TABLET | Freq: Once | ORAL | Status: AC | PRN
  Administered 2018-07-17: 4 mg via ORAL
  Filled 2018-07-17: qty 1

## 2018-07-17 NOTE — ED Notes (Signed)
Pt did not want to wait Pt left.

## 2018-07-17 NOTE — ED Notes (Signed)
Pt is hard of hearing.  Hears better on rt side

## 2018-07-17 NOTE — ED Triage Notes (Signed)
Pt is very hard of hearing, can hear alittle out her right ear and she can read lips.  Pt reports she has been throwing up since 8pm tonight and her acid reflux "is real bad."

## 2018-07-18 ENCOUNTER — Telehealth: Payer: Self-pay | Admitting: Internal Medicine

## 2018-07-18 LAB — COMPREHENSIVE METABOLIC PANEL
ALT: 13 U/L (ref 0–44)
AST: 16 U/L (ref 15–41)
Albumin: 3.7 g/dL (ref 3.5–5.0)
Alkaline Phosphatase: 58 U/L (ref 38–126)
Anion gap: 14 (ref 5–15)
BUN: 9 mg/dL (ref 8–23)
CO2: 28 mmol/L (ref 22–32)
Calcium: 10.1 mg/dL (ref 8.9–10.3)
Chloride: 102 mmol/L (ref 98–111)
Creatinine, Ser: 1.21 mg/dL — ABNORMAL HIGH (ref 0.44–1.00)
GFR calc Af Amer: 52 mL/min — ABNORMAL LOW (ref >60)
GFR calc non Af Amer: 45 mL/min — ABNORMAL LOW (ref >60)
Glucose, Bld: 202 mg/dL — ABNORMAL HIGH (ref 70–99)
Potassium: 3.2 mmol/L — ABNORMAL LOW (ref 3.5–5.1)
Sodium: 144 mmol/L (ref 135–145)
Total Bilirubin: 0.6 mg/dL (ref 0.3–1.2)
Total Protein: 7.2 g/dL (ref 6.5–8.1)

## 2018-07-18 LAB — LIPASE, BLOOD: Lipase: 26 U/L (ref 11–51)

## 2018-07-18 NOTE — Telephone Encounter (Signed)
Copied from [place] [phone]. Topic: Quick Communication - See Telephone Encounter >> Jul 18, 2018  4:15 PM Sheran Luz wrote: CRM for notification. See Telephone encounter for: 07/18/18.  Langley Gauss, from [facility], calling to make PCP aware that patient went to ED late Sunday night for reflux and that she thinks that patient has not been taking her medications or testing her b/s. Langley Gauss is requesting a call back from medical assistant to discuss concerns further and obtain a current medication list for patient. Please advise.

## 2018-07-20 ENCOUNTER — Emergency Department (HOSPITAL_COMMUNITY): Payer: Medicare Other

## 2018-07-20 ENCOUNTER — Inpatient Hospital Stay (HOSPITAL_COMMUNITY)
Admission: EM | Admit: 2018-07-20 | Discharge: 2018-07-25 | DRG: 314 | Disposition: A | Discharge status: Skilled Nursing Facility | Payer: Medicare Other | Attending: Internal Medicine | Admitting: Internal Medicine

## 2018-07-20 ENCOUNTER — Encounter (HOSPITAL_COMMUNITY): Payer: Self-pay

## 2018-07-20 ENCOUNTER — Other Ambulatory Visit: Payer: Self-pay

## 2018-07-20 DIAGNOSIS — Z79899 Other long term (current) drug therapy: Secondary | ICD-10-CM

## 2018-07-20 DIAGNOSIS — I10 Essential (primary) hypertension: Secondary | ICD-10-CM | POA: Diagnosis present

## 2018-07-20 DIAGNOSIS — R7989 Other specified abnormal findings of blood chemistry: Secondary | ICD-10-CM | POA: Diagnosis present

## 2018-07-20 DIAGNOSIS — K219 Gastro-esophageal reflux disease without esophagitis: Secondary | ICD-10-CM | POA: Diagnosis present

## 2018-07-20 DIAGNOSIS — E785 Hyperlipidemia, unspecified: Secondary | ICD-10-CM | POA: Diagnosis present

## 2018-07-20 DIAGNOSIS — Z794 Long term (current) use of insulin: Secondary | ICD-10-CM

## 2018-07-20 DIAGNOSIS — L8990 Pressure ulcer of unspecified site, unspecified stage: Secondary | ICD-10-CM

## 2018-07-20 DIAGNOSIS — F419 Anxiety disorder, unspecified: Secondary | ICD-10-CM | POA: Diagnosis present

## 2018-07-20 DIAGNOSIS — Z880 Allergy status to penicillin: Secondary | ICD-10-CM

## 2018-07-20 DIAGNOSIS — F333 Major depressive disorder, recurrent, severe with psychotic symptoms: Secondary | ICD-10-CM

## 2018-07-20 DIAGNOSIS — R778 Other specified abnormalities of plasma proteins: Secondary | ICD-10-CM

## 2018-07-20 DIAGNOSIS — R001 Bradycardia, unspecified: Secondary | ICD-10-CM | POA: Diagnosis present

## 2018-07-20 DIAGNOSIS — G9341 Metabolic encephalopathy: Secondary | ICD-10-CM | POA: Diagnosis present

## 2018-07-20 DIAGNOSIS — N179 Acute kidney failure, unspecified: Secondary | ICD-10-CM | POA: Diagnosis present

## 2018-07-20 DIAGNOSIS — I952 Hypotension due to drugs: Secondary | ICD-10-CM | POA: Diagnosis not present

## 2018-07-20 DIAGNOSIS — Z888 Allergy status to other drugs, medicaments and biological substances status: Secondary | ICD-10-CM

## 2018-07-20 DIAGNOSIS — N390 Urinary tract infection, site not specified: Secondary | ICD-10-CM | POA: Diagnosis present

## 2018-07-20 DIAGNOSIS — R1310 Dysphagia, unspecified: Secondary | ICD-10-CM | POA: Diagnosis present

## 2018-07-20 DIAGNOSIS — E1122 Type 2 diabetes mellitus with diabetic chronic kidney disease: Secondary | ICD-10-CM | POA: Diagnosis present

## 2018-07-20 DIAGNOSIS — Z9119 Patient's noncompliance with other medical treatment and regimen: Secondary | ICD-10-CM | POA: Diagnosis not present

## 2018-07-20 DIAGNOSIS — B961 Klebsiella pneumoniae [K. pneumoniae] as the cause of diseases classified elsewhere: Secondary | ICD-10-CM | POA: Diagnosis present

## 2018-07-20 DIAGNOSIS — E876 Hypokalemia: Secondary | ICD-10-CM | POA: Diagnosis not present

## 2018-07-20 DIAGNOSIS — I341 Nonrheumatic mitral (valve) prolapse: Secondary | ICD-10-CM | POA: Diagnosis present

## 2018-07-20 DIAGNOSIS — I9589 Other hypotension: Principal | ICD-10-CM | POA: Diagnosis present

## 2018-07-20 DIAGNOSIS — Z982 Presence of cerebrospinal fluid drainage device: Secondary | ICD-10-CM

## 2018-07-20 DIAGNOSIS — Z87440 Personal history of urinary (tract) infections: Secondary | ICD-10-CM | POA: Diagnosis not present

## 2018-07-20 DIAGNOSIS — F329 Major depressive disorder, single episode, unspecified: Secondary | ICD-10-CM | POA: Diagnosis present

## 2018-07-20 DIAGNOSIS — T50901A Poisoning by unspecified drugs, medicaments and biological substances, accidental (unintentional), initial encounter: Secondary | ICD-10-CM

## 2018-07-20 DIAGNOSIS — K220 Achalasia of cardia: Secondary | ICD-10-CM | POA: Diagnosis present

## 2018-07-20 DIAGNOSIS — M5412 Radiculopathy, cervical region: Secondary | ICD-10-CM

## 2018-07-20 DIAGNOSIS — J9690 Respiratory failure, unspecified, unspecified whether with hypoxia or hypercapnia: Secondary | ICD-10-CM

## 2018-07-20 DIAGNOSIS — Z9114 Patient's other noncompliance with medication regimen: Secondary | ICD-10-CM

## 2018-07-20 LAB — CBC
HCT: 41.1 % (ref 36.0–46.0)
HCT: 39.2 % (ref 36.0–46.0)
Hemoglobin: 13.2 g/dL (ref 12.0–15.0)
Hemoglobin: 12.9 g/dL (ref 12.0–15.0)
MCH: 30.9 pg (ref 26.0–34.0)
MCH: 31.2 pg (ref 26.0–34.0)
MCHC: 32.1 g/dL (ref 30.0–36.0)
MCHC: 32.9 g/dL (ref 30.0–36.0)
MCV: 96.3 fL (ref 80.0–100.0)
MCV: 94.7 fL (ref 80.0–100.0)
Platelets: 211 10*3/uL (ref 150–400)
Platelets: 206 10*3/uL (ref 150–400)
RBC: 4.27 MIL/uL (ref 3.87–5.11)
RBC: 4.14 MIL/uL (ref 3.87–5.11)
RDW: 12.8 % (ref 11.5–15.5)
RDW: 12.6 % (ref 11.5–15.5)
WBC: 7.9 10*3/uL (ref 4.0–10.5)
WBC: 7 10*3/uL (ref 4.0–10.5)
nRBC: 0 % (ref 0.0–0.2)
nRBC: 0 % (ref 0.0–0.2)

## 2018-07-20 LAB — GLUCOSE, CAPILLARY
Glucose-Capillary: 100 mg/dL — ABNORMAL HIGH (ref 70–99)
Glucose-Capillary: 137 mg/dL — ABNORMAL HIGH (ref 70–99)

## 2018-07-20 LAB — I-STAT VENOUS BLOOD GAS, ED
Acid-Base Excess: 4 mmol/L — ABNORMAL HIGH (ref 0.0–2.0)
Bicarbonate: 25.7 mmol/L (ref 20.0–28.0)
O2 Saturation: 85 %
TCO2: 27 mmol/L (ref 22–32)
pCO2, Ven: 28.3 mmHg — ABNORMAL LOW (ref 44.0–60.0)
pH, Ven: 7.567 — ABNORMAL HIGH (ref 7.250–7.430)
pO2, Ven: 42 mmHg (ref 32.0–45.0)

## 2018-07-20 LAB — MRSA PCR SCREENING: MRSA by PCR: NEGATIVE

## 2018-07-20 LAB — DIFFERENTIAL
Abs Immature Granulocytes: 0.02 10*3/uL (ref 0.00–0.07)
Basophils Absolute: 0.1 10*3/uL (ref 0.0–0.1)
Basophils Relative: 1 %
Eosinophils Absolute: 0.4 10*3/uL (ref 0.0–0.5)
Eosinophils Relative: 5 %
Immature Granulocytes: 0 %
Lymphocytes Relative: 23 %
Lymphs Abs: 1.8 10*3/uL (ref 0.7–4.0)
Monocytes Absolute: 0.5 10*3/uL (ref 0.1–1.0)
Monocytes Relative: 7 %
Neutro Abs: 5 10*3/uL (ref 1.7–7.7)
Neutrophils Relative %: 64 %

## 2018-07-20 LAB — COMPREHENSIVE METABOLIC PANEL
ALT: 13 U/L (ref 0–44)
AST: 23 U/L (ref 15–41)
Albumin: 3.5 g/dL (ref 3.5–5.0)
Alkaline Phosphatase: 49 U/L (ref 38–126)
Anion gap: 13 (ref 5–15)
BUN: 36 mg/dL — ABNORMAL HIGH (ref 8–23)
CO2: 23 mmol/L (ref 22–32)
Calcium: 8.7 mg/dL — ABNORMAL LOW (ref 8.9–10.3)
Chloride: 99 mmol/L (ref 98–111)
Creatinine, Ser: 2.44 mg/dL — ABNORMAL HIGH (ref 0.44–1.00)
GFR calc Af Amer: 22 mL/min — ABNORMAL LOW (ref >60)
GFR calc non Af Amer: 19 mL/min — ABNORMAL LOW (ref >60)
Glucose, Bld: 205 mg/dL — ABNORMAL HIGH (ref 70–99)
Potassium: 3.6 mmol/L (ref 3.5–5.1)
Sodium: 135 mmol/L (ref 135–145)
Total Bilirubin: 0.9 mg/dL (ref 0.3–1.2)
Total Protein: 6.4 g/dL — ABNORMAL LOW (ref 6.5–8.1)

## 2018-07-20 LAB — I-STAT CHEM 8, ED
BUN: 34 mg/dL — ABNORMAL HIGH (ref 8–23)
Calcium, Ion: 0.97 mmol/L — ABNORMAL LOW (ref 1.15–1.40)
Chloride: 101 mmol/L (ref 98–111)
Creatinine, Ser: 2.5 mg/dL — ABNORMAL HIGH (ref 0.44–1.00)
Glucose, Bld: 200 mg/dL — ABNORMAL HIGH (ref 70–99)
HCT: 39 % (ref 36.0–46.0)
Hemoglobin: 13.3 g/dL (ref 12.0–15.0)
Potassium: 3.6 mmol/L (ref 3.5–5.1)
Sodium: 135 mmol/L (ref 135–145)
TCO2: 23 mmol/L (ref 22–32)

## 2018-07-20 LAB — CREATININE, SERUM
Creatinine, Ser: 1.86 mg/dL — ABNORMAL HIGH (ref 0.44–1.00)
GFR calc Af Amer: 31 mL/min — ABNORMAL LOW (ref >60)
GFR calc non Af Amer: 27 mL/min — ABNORMAL LOW (ref >60)

## 2018-07-20 LAB — APTT: aPTT: 28 seconds (ref 24–36)

## 2018-07-20 LAB — TROPONIN I
Troponin I: 0.03 ng/mL (ref <0.03)
Troponin I: <0.03 ng/mL (ref <0.03)

## 2018-07-20 LAB — I-STAT TROPONIN, ED: Troponin i, poc: 0.62 ng/mL (ref 0.00–0.08)

## 2018-07-20 LAB — ETHANOL: Alcohol, Ethyl (B): <10 mg/dL (ref <10)

## 2018-07-20 LAB — PROTIME-INR
INR: 1.13
Prothrombin Time: 14.4 seconds (ref 11.4–15.2)

## 2018-07-20 LAB — CBG MONITORING, ED: Glucose-Capillary: 197 mg/dL — ABNORMAL HIGH (ref 70–99)

## 2018-07-20 MED ORDER — LORAZEPAM 0.5 MG PO TABS
0.5000 mg | ORAL_TABLET | Freq: Three times a day (TID) | ORAL | Status: DC | PRN
  Administered 2018-07-20 – 2018-07-25 (×9): 0.5 mg via ORAL
  Filled 2018-07-20 (×9): qty 1

## 2018-07-20 MED ORDER — SODIUM CHLORIDE 0.9 % IV SOLN
INTRAVENOUS | Status: DC
  Administered 2018-07-20 – 2018-07-21 (×3): via INTRAVENOUS

## 2018-07-20 MED ORDER — SODIUM CHLORIDE 0.9 % IV BOLUS
1000.0000 mL | Freq: Once | INTRAVENOUS | Status: AC
  Administered 2018-07-20: 1000 mL via INTRAVENOUS

## 2018-07-20 MED ORDER — GLYCOPYRROLATE 25 MCG/ML IN SOLN: 1.0000 | Freq: Two times a day (BID) | RESPIRATORY_TRACT | Status: DC

## 2018-07-20 MED ORDER — VANCOMYCIN VARIABLE DOSE PER UNSTABLE RENAL FUNCTION (PHARMACIST DOSING): Status: DC

## 2018-07-20 MED ORDER — VANCOMYCIN HCL 10 G IV SOLR
2000.0000 mg | Freq: Once | INTRAVENOUS | Status: DC
  Filled 2018-07-20: qty 2000

## 2018-07-20 MED ORDER — ARIPIPRAZOLE 2 MG PO TABS
2.0000 mg | ORAL_TABLET | Freq: Every day | ORAL | Status: DC
  Administered 2018-07-21 – 2018-07-25 (×5): 2 mg via ORAL
  Filled 2018-07-20 (×5): qty 1

## 2018-07-20 MED ORDER — SODIUM CHLORIDE 0.9 % IV SOLN: 2.0000 g | Freq: Once | INTRAVENOUS | Status: DC

## 2018-07-20 MED ORDER — ACETAMINOPHEN 325 MG PO TABS
650.0000 mg | ORAL_TABLET | ORAL | Status: DC | PRN
  Administered 2018-07-20 – 2018-07-24 (×7): 650 mg via ORAL
  Filled 2018-07-20 (×7): qty 2

## 2018-07-20 MED ORDER — NOREPINEPHRINE BITARTRATE 1 MG/ML IV SOLN
2.0000 ug/min | INTRAVENOUS | Status: DC
  Administered 2018-07-20: 2.5 ug/min via INTRAVENOUS
  Filled 2018-07-20: qty 4

## 2018-07-20 MED ORDER — SODIUM CHLORIDE 0.9 % IV SOLN: 250.0000 mL | INTRAVENOUS | Status: DC

## 2018-07-20 MED ORDER — VANCOMYCIN HCL IN DEXTROSE 1-5 GM/200ML-% IV SOLN: 1000.0000 mg | Freq: Once | INTRAVENOUS | Status: DC

## 2018-07-20 MED ORDER — SODIUM CHLORIDE 0.9 % IV SOLN: 1.0000 g | INTRAVENOUS | Status: DC

## 2018-07-20 MED ORDER — ONDANSETRON HCL 4 MG/2ML IJ SOLN
4.0000 mg | Freq: Four times a day (QID) | INTRAMUSCULAR | Status: DC | PRN
  Administered 2018-07-21 (×2): 4 mg via INTRAVENOUS
  Filled 2018-07-20 (×2): qty 2

## 2018-07-20 MED ORDER — TRAZODONE HCL 50 MG PO TABS
50.0000 mg | ORAL_TABLET | Freq: Every day | ORAL | Status: DC
  Administered 2018-07-20 – 2018-07-24 (×4): 50 mg via ORAL
  Filled 2018-07-20 (×5): qty 1

## 2018-07-20 MED ORDER — VITAMIN B-12 1000 MCG PO TABS
2000.0000 ug | ORAL_TABLET | Freq: Every day | ORAL | Status: DC
  Administered 2018-07-21 – 2018-07-25 (×5): 2000 ug via ORAL
  Filled 2018-07-20 (×5): qty 2

## 2018-07-20 MED ORDER — METRONIDAZOLE IN NACL 5-0.79 MG/ML-% IV SOLN: 500.0000 mg | Freq: Three times a day (TID) | INTRAVENOUS | Status: DC

## 2018-07-20 MED ORDER — NOREPINEPHRINE BITARTRATE 1 MG/ML IV SOLN
0.0000 ug/min | INTRAVENOUS | Status: DC
  Administered 2018-07-20: 5 ug/min via INTRAVENOUS

## 2018-07-20 MED ORDER — HEPARIN SODIUM (PORCINE) 5000 UNIT/ML IJ SOLN
5000.0000 [IU] | Freq: Three times a day (TID) | INTRAMUSCULAR | Status: DC
  Administered 2018-07-20 – 2018-07-25 (×15): 5000 [IU] via SUBCUTANEOUS
  Filled 2018-07-20 (×15): qty 1

## 2018-07-20 MED ORDER — INSULIN ASPART 100 UNIT/ML ~~LOC~~ SOLN
3.0000 [IU] | SUBCUTANEOUS | Status: DC
  Administered 2018-07-20: 6 [IU] via SUBCUTANEOUS
  Administered 2018-07-20 – 2018-07-21 (×2): 3 [IU] via SUBCUTANEOUS
  Administered 2018-07-21: 6 [IU] via SUBCUTANEOUS
  Administered 2018-07-21: 3 [IU] via SUBCUTANEOUS

## 2018-07-20 MED ORDER — PANTOPRAZOLE SODIUM 40 MG PO TBEC: 40.0000 mg | DELAYED_RELEASE_TABLET | Freq: Every day | ORAL | Status: DC

## 2018-07-20 NOTE — Progress Notes (Addendum)
Stroke Neurology Consultation Note  Consult Requested by: Dr. Ellender Hose  Reason for Consult: code stroke  Consult Date: 07/20/18  The history was obtained from the pt and stroke RN.  During history and examination, all items were able to obtain unless otherwise noted.  History of Present Illness:  [name] is a 72 y.o. 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 When friend went to her house later, she was found to be lethargic, sleepy, and less responsive. EMS called, on arrival, pt was not able to follow any commands, very drowsy, slurry speech and not able to walk. Code stroke activated.   Pt in ER gradually improved and became more awake alert, initially still has slurry speech but that also gradually cleared after CT head which showed no acute abnormality, but chronic right BG infarct and shunt catheter. However, pt was found to be hypotensive, bradycardia and elevated troponin.   Pt denies any CP, SOB, but stated that she was not taking her meds regularly but yesterday, home health nurse helped her make all her meds and started took it regularly last night. She felt some drowsiness last night dose. Today she took the morning and afternoon meds, but she felt very drowsy after and fell to sleep when her friends found her.   LSN: 10:15 am tPA Given: No: neuro symptoms resolved, etiology likes non-stroke  Past Medical History:  Diagnosis Date  . Acid reflux disease   . Acute kidney injury ([place])   . Anxiety   . Depression   . Diabetes mellitus without complication ([place])   . Dizziness   . Falls   . Hydrocephalus ([place])   . Hypertension   . Hypotension   . MVP (mitral valve prolapse)   . Sinus bradycardia   . Transaminitis     Past Surgical History:  Procedure Laterality Date  . BRAIN SURGERY    . BREAST BIOPSY Left   . CHOLECYSTECTOMY    .  ERCP N/A 02/28/2014   Procedure: ENDOSCOPIC RETROGRADE CHOLANGIOPANCREATOGRAPHY (ERCP);  Surgeon: Gatha Mayer, MD;  Location: [facility] ENDOSCOPY;  Service: Endoscopy;  Laterality: N/A;  talked to tiffany from or /ebp  . ESOPHAGOGASTRODUODENOSCOPY N/A 02/28/2014   Procedure: ESOPHAGOGASTRODUODENOSCOPY (EGD);  Surgeon: Gatha Mayer, MD;  Location: [facility] ENDOSCOPY;  Service: Endoscopy;  Laterality: N/A;  . VENTRICULOPERITONEAL SHUNT     right occipital    Family History  Problem Relation Age of Onset  . Heart disease Mother   . Stroke Mother   . Hypertension Mother   . Alcohol abuse Father   . Diabetes Father   . Cancer Neg Hx     Social History:  reports that she has never smoked. She has never used smokeless tobacco. She reports that she does not drink alcohol or use drugs.  Allergies:  Allergies  Allergen Reactions  . Metformin And Related Diarrhea    (takes metformin at home w/ meals)  . Ace Inhibitors Other (See Comments)    unknown  . Erythromycin Hives  . Penicillins Hives    Has patient had a PCN reaction causing immediate rash, facial/tongue/throat swelling, SOB or lightheadedness with hypotension: Yes Has patient had a PCN reaction causing severe rash involving mucus membranes or skin necrosis: No Has patient had a PCN reaction that required hospitalization: No Has patient had a PCN reaction  occurring within the last 10 years: No If all of the above answers are "NO", then may proceed with Cephalosporin use.   . Topamax [Topiramate] Rash    No current facility-administered medications on file prior to encounter.    Current Outpatient Medications on File Prior to Encounter  Medication Sig Dispense Refill  . ARIPiprazole (ABILIFY) 2 MG tablet Take 1 tablet (2 mg total) by mouth daily. 90 tablet 0  . atorvastatin (LIPITOR) 40 MG tablet Take 1 tablet (40 mg total) by mouth daily. 90 tablet 1  . Azilsartan-Chlorthalidone (EDARBYCLOR) 40-25 MG TABS Take 1 tablet by mouth daily.  70 tablet 0  . cyanocobalamin 2000 MCG tablet Take 1 tablet (2,000 mcg total) by mouth daily. 90 tablet 3  . glucose blood (ONE TOUCH ULTRA TEST) test strip USE TO CHECK BLOOD SUGAR 3 TIMES DAILY 100 each 11  . Glycopyrrolate (LONHALA MAGNAIR REFILL KIT) 25 MCG/ML SOLN Inhale 1 puff into the lungs 2 (two) times daily. 60 mL 11  . Glycopyrrolate (LONHALA MAGNAIR STARTER KIT) 25 MCG/ML SOLN Inhale 1 Act into the lungs 2 (two) times daily. 60 mL 11  . Insulin Pen Needle (NOVOFINE) 32G X 6 MM MISC 1 Act by Does not apply route daily. 100 each 3  . JANUVIA 100 MG tablet TAKE 1 TABLET (100 MG TOTAL) BY MOUTH DAILY. 90 tablet 1  . LORazepam (ATIVAN) 1 MG tablet Take 0.5 tablets (0.5 mg total) by mouth every 8 (eight) hours as needed for anxiety. 30 tablet 4  . nebivolol (BYSTOLIC) 10 MG tablet Take 1 tablet (10 mg total) by mouth daily. 126 tablet 0  . pantoprazole (PROTONIX) 40 MG tablet Take 1 tablet (40 mg total) by mouth daily. (Patient taking differently: Take 40 mg by mouth daily as needed (reflux). ) 90 tablet 3  . potassium chloride SA (KLOR-CON M20) 20 MEQ tablet Take 1 tablet (20 mEq total) by mouth 2 (two) times daily. 180 tablet 1  . repaglinide (PRANDIN) 2 MG tablet TAKE 1 TABLET (2 MG TOTAL) BY MOUTH 3 (THREE) TIMES DAILY BEFORE MEALS. 90 tablet 9  . SUMAtriptan (IMITREX) 50 MG tablet TAKE 1 TABLET BY MOUTH AS NEEDED FOR MIGRAINE, CAN REPEAT IN 2 HOURS IF HEADACHE PERSISTS 10 tablet 5  . tizanidine (ZANAFLEX) 2 MG capsule TAKE ONE CAPSULE 3 TIMES A DAY 270 capsule 1  . traZODone (DESYREL) 50 MG tablet Take 100 mg by mouth at bedtime  3  . Vilazodone HCl (VIIBRYD) 40 MG TABS Take by mouth daily.      Review of Systems: A full ROS was attempted today and was able to be performed.  Systems assessed include - Constitutional, Eyes, HENT, Respiratory, Cardiovascular, Gastrointestinal, Genitourinary, Integument/breast, Hematologic/lymphatic, Musculoskeletal, Neurological, Behavioral/Psych,  Endocrine, Allergic/Immunologic - with pertinent responses as per HPI.  Physical Examination: Temp:  [97.5 F (36.4 C)] 97.5 F (36.4 C) (12/19 1408) Pulse Rate:  [57] 57 (12/19 1408) Resp:  [18] 18 (12/19 1408) BP: (68)/(38) 68/38 (12/19 1408) SpO2:  [96 %] 96 % (12/19 1408)  General - well nourished, well developed, in no apparent distress.    Ophthalmologic - fundi not visualized due to noncooperation.    Cardiovascular - bradycardia with PACs on tele  Mental Status -  Level of arousal and orientation to time, place, and person were intact. Language including expression, naming, repetition, comprehension, reading, and writing was assessed and found intact.  Cranial Nerves II - XII - II - Vision intact OU. III, IV, VI -  Extraocular movements intact. V - Facial sensation intact bilaterally. VII - Facial movement intact bilaterally. VIII - Hearing & vestibular intact bilaterally. X - Palate elevates symmetrically. XI - Chin turning & shoulder shrug intact bilaterally. XII - Tongue protrusion intact.  Motor Strength - The patient's strength was normal in all extremities and pronator drift was absent.   Motor Tone & Bulk - Muscle tone was assessed at the neck and appendages and was normal.  Bulk was normal and fasciculations were absent.   Reflexes - The patient's reflexes were normal in all extremities and she had no pathological reflexes.  Sensory - Light touch, temperature/pinprick were assessed and were normal.    Coordination - The patient had normal movements in the hands and feet with no ataxia or dysmetria.  Tremor was absent.  Gait and Station - deferred  Data Reviewed: Dg Chest 2 View  Result Date: 07/12/2018 CLINICAL DATA:  Patient fell yesterday and has had chest pain since that time. EXAM: CHEST - 2 VIEW COMPARISON:  PA and lateral chest x-ray of June 27, 2018. FINDINGS: The lungs are mildly hyperinflated. There is no pleural effusion, pneumothorax, or  pneumomediastinum. The heart and pulmonary vascularity are normal. The mediastinum is normal in width. There is calcification in the wall of the aortic arch. There is mild multilevel degenerative disc disease of the thoracic spine. IMPRESSION: Chronic bronchitic changes, stable. No acute cardiopulmonary abnormality. No evidence of acute post traumatic injury. Electronically Signed   By: David  Martinique M.D.   On: 07/12/2018 07:53   Dg Chest 2 View  Result Date: 06/27/2018 CLINICAL DATA:  Cough over the last 2 weeks EXAM: CHEST - 2 VIEW COMPARISON:  Portable chest x-ray of 11/28/2016 FINDINGS: No active infiltrate or effusion is seen. There is mild peribronchial thickening which can be seen with bronchitis. Mediastinal and hilar contours are unremarkable and the heart is within upper limits normal. No acute bony abnormality is seen. IMPRESSION: No pneumonia or effusion. Peribronchial thickening may indicate bronchitis. Electronically Signed   By: Ivar Drape M.D.   On: 06/27/2018 11:50   Ct Head Wo Contrast  Result Date: 06/27/2018 CLINICAL DATA:  Recent falls with headaches, initial encounter EXAM: CT HEAD WITHOUT CONTRAST TECHNIQUE: Contiguous axial images were obtained from the base of the skull through the vertex without intravenous contrast. COMPARISON:  07/23/2014 FINDINGS: Brain: Right ventricular shunt catheter is again identified. The ventricles are slightly smaller in size when compared with the prior exam. Stable mild prominence of the CSF space along the convexities is noted. Mild chronic white matter ischemic change is seen. No findings to suggest acute hemorrhage, acute infarction or space-occupying mass lesion are noted. Vascular: No hyperdense vessel or unexpected calcification. Skull: Normal. Negative for fracture or focal lesion. Sinuses/Orbits: No acute finding. Other: None. IMPRESSION: Stable right ventricular shunt catheter. Ventricles are slightly smaller than that seen on the prior  exam. No new focal abnormality is noted. Electronically Signed   By: Inez Catalina M.D.   On: 06/27/2018 14:01   Ct Head Code Stroke Wo Contrast  Result Date: 07/20/2018 CLINICAL DATA:  Code stroke. 72 y/o F; unexplained altered level of consciousness. EXAM: CT HEAD WITHOUT CONTRAST TECHNIQUE: Contiguous axial images were obtained from the base of the skull through the vertex without intravenous contrast. COMPARISON:  06/27/2018 CT head FINDINGS: Brain: No evidence of acute infarction, hemorrhage, hydrocephalus, extra-axial collection or mass lesion/mass effect. Right parietal approach ventriculostomy catheter with tip in the right lateral ventricle adjacent to  the foramen of [place]. Stable ventricle size. 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 Stable chronic microvascular ischemic changes and volume loss of the brain. Vascular: Calcific atherosclerosis of carotid siphons. No hyperdense vessel. Skull: Chronic postsurgical changes related to a right parietal burr hole. No acute abnormality. Sinuses/Orbits: No acute finding. Other: Bilateral intra-ocular lens replacement. ASPECTS [facility] Stroke Program Early CT Score) - Ganglionic level infarction (caudate, lentiform nuclei, internal capsule, insula, M1-M3 cortex): 7 - Supraganglionic infarction (M4-M6 cortex): 3 Total score (0-10 with 10 being normal): 10 IMPRESSION: 1. No acute intracranial abnormality identified. 2. ASPECTS is 10. 3. Stable chronic microvascular ischemic changes and volume loss of the brain. 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 4. Stable position of right parietal approach ventriculostomy catheter. Stable ventricle size. These results were called by telephone at the time of interpretation on 07/20/2018 at 1:51 pm to Dr. Duffy Bruce , who verbally acknowledged these results. Electronically Signed   By: Kristine Garbe M.D.   On: 07/20/2018 13:53    Assessment: 72 y.o. female with history of  hypertension, diabetes, hyperlipidemia, NPH status post VP shunt in 2013 presented for code stroke of drowsy, slurred speech and decreased level consciousness.  Neuro symptoms gradually improved and resolved, CT no acute abnormality.  However patient was found to have profound hypotension, bradycardia and elevated troponin.  Etiology likely due to medication effect, or overdose of medication.  Code stroke canceled.   Plan: -Continue management of hypertension and bradycardia as well as elevated troponin as per ER physician -CT no acute abnormality except chronic right BG infarct.  Since etiology felt non-stroke this time, MRI brain or cerebrovascular imaging not necessary unless further neuro changes. -Follow-up with Dr. Trenton Gammon neurosurgery for NPH status post VP shunt follow-up  -Stroke risk factor modification -Medication compliance  Thank you for this consultation and allowing [place] to participate in the care of this patient.  Rosalin Hawking, MD PhD Stroke Neurology 07/20/2018 2:55 PM  This patient is critically ill due to code stroke, profound hypotension, bradycardia and elevated troponin and at significant risk of neurological worsening, death form hypovolemic shock, heart failure, cardiac arrest, myocardial infarction. This patient's care requires constant monitoring of vital signs, hemodynamics, respiratory and cardiac monitoring, review of multiple databases, neurological assessment, discussion with family, other specialists and medical decision making of high complexity. I spent 50 minutes of neurocritical care time in the care of this patient.

## 2018-07-20 NOTE — Progress Notes (Signed)
eLink Physician-Brief Progress Note Patient Name: [name] DOB: [birth year] MRN: [id number]   Date of Service  07/20/2018  HPI/Events of Note  Pt complained of chest pains related to eating.  She has seen GI for possible esophageal dilation.    EKG unchanged compared to prior.    eICU Interventions  Follow up troponin.   Pt remains on norepinephrine.  Change diet to soft.     Intervention Category Major Interventions: Other:  Elsie Lincoln 07/20/2018, 8:08 PM

## 2018-07-20 NOTE — ED Provider Notes (Signed)
[facility] Provider Note   CSN: [id number] Arrival date & time: 07/20/18  1331     History   Chief Complaint Chief Complaint  Patient presents with  . Code Stroke    HPI [name] is a 71 y.o. female.  HPI   72 yo F with h/o DM, HTN, hydrocephalus, MVP here w/ AMS. Initially activated as a CODE STROKE 2/2 slurred speech. Initial BP 120s. CT Head neg. Pt w/ slurred speech but denies any other complaints. Upon returning to resusc room, pt noted to become more confused, bradycardic, and hypotensive. 3L NS given, and levophed started with improvement in BP and mentation. She admits that she may have gotten her daily medications confused and taken a double dose today. She has a h/o non adherence per report but took 2 days worth of meds today. She had some mild nausea, vomiting before that but denies any fevers or infectious sx. She feels tired currently but o/w denies complaints.  Past Medical History:  Diagnosis Date  . Acid reflux disease   . Acute kidney injury ([place])   . Anxiety   . Depression   . Diabetes mellitus without complication ([place])   . Dizziness   . Falls   . Hydrocephalus ([place])   . Hypertension   . Hypotension   . MVP (mitral valve prolapse)   . Sinus bradycardia   . Transaminitis     Patient Active Problem List   Diagnosis Date Noted  . Chest pressure 07/11/2018  . Intractable episodic headache 06/27/2018  . Frequent UTI 03/08/2018  . Carpal tunnel syndrome of left wrist 03/07/2018  . Chronic midline low back pain 09/22/2017  . Current severe episode of major depressive disorder with psychotic features ([place]) 08/15/2017  . Simple chronic bronchitis ([place]) 06/28/2017  . Excessive daytime sleepiness 12/21/2016  . Migraine without aura and with status migrainosus, not intractable 03/04/2016  . Left ventricular diastolic dysfunction, NYHA class 2 01/21/2016  . Morbid obesity ([place]) 01/21/2016  . Psychogenic  vomiting with nausea 11/02/2015  . Cervical radiculitis 08/07/2015  . GAD (generalized anxiety disorder) 07/18/2014  . Primary osteoarthritis of both knees 05/23/2014  . Visit for screening mammogram 05/22/2014  . Hyperlipidemia with target LDL less than 100 05/22/2014  . Falls frequently 04/02/2014  . Vitamin D deficiency 12/28/2013  . Estrogen deficiency 12/27/2013  . Routine general medical examination at a health care facility 12/27/2013  . Type II diabetes mellitus with manifestations ([place]) 08/15/2013  . OAB (overactive bladder) 08/15/2013  . Vitamin B12 deficiency anemia 06/05/2010  . HYDROCEPHALUS, NORMAL PRESSURE 06/03/2010  . Essential hypertension 05/15/2008    Past Surgical History:  Procedure Laterality Date  . BRAIN SURGERY    . BREAST BIOPSY Left   . CHOLECYSTECTOMY    . ERCP N/A 02/28/2014   Procedure: ENDOSCOPIC RETROGRADE CHOLANGIOPANCREATOGRAPHY (ERCP);  Surgeon: Gatha Mayer, MD;  Location: [facility] ENDOSCOPY;  Service: Endoscopy;  Laterality: N/A;  talked to tiffany from or /ebp  . ESOPHAGOGASTRODUODENOSCOPY N/A 02/28/2014   Procedure: ESOPHAGOGASTRODUODENOSCOPY (EGD);  Surgeon: Gatha Mayer, MD;  Location: [facility] ENDOSCOPY;  Service: Endoscopy;  Laterality: N/A;  . VENTRICULOPERITONEAL SHUNT     right occipital     OB History   No obstetric history on file.      Home Medications    Prior to Admission medications   Medication Sig Start Date End Date Taking? Authorizing Provider  ARIPiprazole (ABILIFY) 2 MG tablet Take 1 tablet (2 mg total)  by mouth daily. 06/28/18   Janith Lima, MD  atorvastatin (LIPITOR) 40 MG tablet Take 1 tablet (40 mg total) by mouth daily. 07/04/18   Janith Lima, MD  Azilsartan-Chlorthalidone (EDARBYCLOR) 40-25 MG TABS Take 1 tablet by mouth daily. 06/27/18   Janith Lima, MD  cyanocobalamin 2000 MCG tablet Take 1 tablet (2,000 mcg total) by mouth daily. 08/09/15   Janith Lima, MD  glucose blood (ONE TOUCH ULTRA TEST) test  strip USE TO CHECK BLOOD SUGAR 3 TIMES DAILY 04/18/18   Janith Lima, MD  Glycopyrrolate [facility] REFILL KIT) 25 MCG/ML SOLN Inhale 1 puff into the lungs 2 (two) times daily. 06/28/18   Janith Lima, MD  Glycopyrrolate [facility] MAGNAIR STARTER KIT) 25 MCG/ML SOLN Inhale 1 Act into the lungs 2 (two) times daily. 06/28/18   Janith Lima, MD  Insulin Pen Needle (NOVOFINE) 32G X 6 MM MISC 1 Act by Does not apply route daily. 10/13/16   Janith Lima, MD  JANUVIA 100 MG tablet TAKE 1 TABLET (100 MG TOTAL) BY MOUTH DAILY. 01/08/18   Janith Lima, MD  LORazepam (ATIVAN) 1 MG tablet Take 0.5 tablets (0.5 mg total) by mouth every 8 (eight) hours as needed for anxiety. 11/30/17   Doreatha Lew, MD  nebivolol (BYSTOLIC) 10 MG tablet Take 1 tablet (10 mg total) by mouth daily. 12/01/17   Doreatha Lew, MD  pantoprazole (PROTONIX) 40 MG tablet Take 1 tablet (40 mg total) by mouth daily. Patient taking differently: Take 40 mg by mouth daily as needed (reflux).  10/26/17   Janith Lima, MD  potassium chloride SA (KLOR-CON M20) 20 MEQ tablet Take 1 tablet (20 mEq total) by mouth 2 (two) times daily. 12/06/17 06/06/18  Janith Lima, MD  repaglinide (PRANDIN) 2 MG tablet TAKE 1 TABLET (2 MG TOTAL) BY MOUTH 3 (THREE) TIMES DAILY BEFORE MEALS. 01/09/18   Renato Shin, MD  SUMAtriptan (IMITREX) 50 MG tablet TAKE 1 TABLET BY MOUTH AS NEEDED FOR MIGRAINE, CAN REPEAT IN 2 HOURS IF HEADACHE PERSISTS 02/10/18   Janith Lima, MD  tizanidine (ZANAFLEX) 2 MG capsule TAKE ONE CAPSULE 3 TIMES A DAY 01/02/18   Janith Lima, MD  traZODone (DESYREL) 50 MG tablet Take 100 mg by mouth at bedtime 07/24/15   [provider]  Vilazodone HCl (VIIBRYD) 40 MG TABS Take by mouth daily.    [provider]    Family History Family History  Problem Relation Age of Onset  . Heart disease Mother   . Stroke Mother   . Hypertension Mother   . Alcohol abuse Father   . Diabetes Father   .  Cancer Neg Hx     Social History Social History   Tobacco Use  . Smoking status: Never Smoker  . Smokeless tobacco: Never Used  Substance Use Topics  . Alcohol use: No  . Drug use: No     Allergies   Metformin and related; Ace inhibitors; Erythromycin; Penicillins; and Topamax [topiramate]   Review of Systems Review of Systems  Unable to perform ROS: Mental status change  Constitutional: Positive for fatigue.  Neurological: Positive for speech difficulty and weakness.  All other systems reviewed and are negative.    Physical Exam Updated Vital Signs BP 135/82   Pulse (!) 46   Temp (!) 97.5 F (36.4 C) (Temporal)   Resp 16   SpO2 100%   Physical Exam Vitals signs and nursing note  reviewed.  Constitutional:      General: She is not in acute distress.    Appearance: She is well-developed.  HENT:     Head: Normocephalic and atraumatic.     Mouth/Throat:     Mouth: Mucous membranes are dry.  Eyes:     Conjunctiva/sclera: Conjunctivae normal.  Neck:     Musculoskeletal: Neck supple.  Cardiovascular:     Rate and Rhythm: Regular rhythm. Bradycardia present.     Heart sounds: Normal heart sounds. No murmur. No friction rub.  Pulmonary:     Effort: Pulmonary effort is normal. No respiratory distress.     Breath sounds: Normal breath sounds. No wheezing or rales.  Abdominal:     General: There is no distension.  Skin:    Capillary Refill: Capillary refill takes less than 2 seconds.     Coloration: Skin is pale.  Neurological:     Mental Status: She is alert.     Motor: No abnormal muscle tone.     Comments: Oriented x 3. Speech slurred but not aphasic. CN otherwise intact. Strength 5/5 bl UE and LE. Normal sensation to light touch.      ED Treatments / Results  Labs (all labs ordered are listed, but only abnormal results are displayed) Labs Reviewed  COMPREHENSIVE METABOLIC PANEL - Abnormal; Notable for the following components:      Result Value    Glucose, Bld 205 (*)    BUN 36 (*)    Creatinine, Ser 2.44 (*)    Calcium 8.7 (*)    Total Protein 6.4 (*)    GFR calc non Af Amer 19 (*)    GFR calc Af Amer 22 (*)    All other components within normal limits  TROPONIN I - Abnormal; Notable for the following components:   Troponin I 0.03 (*)    All other components within normal limits  I-STAT CHEM 8, ED - Abnormal; Notable for the following components:   BUN 34 (*)    Creatinine, Ser 2.50 (*)    Glucose, Bld 200 (*)    Calcium, Ion 0.97 (*)    All other components within normal limits  I-STAT TROPONIN, ED - Abnormal; Notable for the following components:   Troponin i, poc 0.62 (*)    All other components within normal limits  I-STAT VENOUS BLOOD GAS, ED - Abnormal; Notable for the following components:   pH, Ven 7.567 (*)    pCO2, Ven 28.3 (*)    Acid-Base Excess 4.0 (*)    All other components within normal limits  CBG MONITORING, ED - Abnormal; Notable for the following components:   Glucose-Capillary 197 (*)    All other components within normal limits  CULTURE, BLOOD (ROUTINE X 2)  CULTURE, BLOOD (ROUTINE X 2)  URINE CULTURE  ETHANOL  PROTIME-INR  APTT  CBC  DIFFERENTIAL  RAPID URINE DRUG SCREEN, HOSP PERFORMED  URINALYSIS, ROUTINE W REFLEX MICROSCOPIC  I-STAT CG4 LACTIC ACID, ED    EKG None  Radiology Dg Chest Portable 1 View  Result Date: 07/20/2018 CLINICAL DATA:  Shortness of breath EXAM: PORTABLE CHEST 1 VIEW COMPARISON:  July 11, 2018 FINDINGS: The distal tip of a right central line is not well visualized but terminates at least at the level of the SVC. Mild increased interstitial markings in the lungs. No pneumothorax. No focal infiltrate. No other change. IMPRESSION: 1. A new right central line is difficult to see distally but likely terminates in the SVC. No  pneumothorax. 2. Findings suggest pulmonary venous congestion/mild edema. Electronically Signed   By: Dorise Bullion III M.D   On: 07/20/2018  14:59   Ct Head Code Stroke Wo Contrast  Result Date: 07/20/2018 CLINICAL DATA:  Code stroke. 72 y/o F; unexplained altered level of consciousness. EXAM: CT HEAD WITHOUT CONTRAST TECHNIQUE: Contiguous axial images were obtained from the base of the skull through the vertex without intravenous contrast. COMPARISON:  06/27/2018 CT head FINDINGS: Brain: No evidence of acute infarction, hemorrhage, hydrocephalus, extra-axial collection or mass lesion/mass effect. Right parietal approach ventriculostomy catheter with tip in the right lateral ventricle adjacent to the foramen of [place]ro. Stable ventricle size. 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 Stable chronic microvascular ischemic changes and volume loss of the brain. Vascular: Calcific atherosclerosis of carotid siphons. No hyperdense vessel. Skull: Chronic postsurgical changes related to a right parietal burr hole. No acute abnormality. Sinuses/Orbits: No acute finding. Other: Bilateral intra-ocular lens replacement. ASPECTS [facility] Stroke Program Early CT Score) - Ganglionic level infarction (caudate, lentiform nuclei, internal capsule, insula, M1-M3 cortex): 7 - Supraganglionic infarction (M4-M6 cortex): 3 Total score (0-10 with 10 being normal): 10 IMPRESSION: 1. No acute intracranial abnormality identified. 2. ASPECTS is 10. 3. Stable chronic microvascular ischemic changes and volume loss of the brain. 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 4. Stable position of right parietal approach ventriculostomy catheter. Stable ventricle size. These results were called by telephone at the time of interpretation on 07/20/2018 at 1:51 pm to Dr. Duffy Bruce , who verbally acknowledged these results. Electronically Signed   By: Kristine Garbe M.D.   On: 07/20/2018 13:53    Procedures .Critical Care Performed by: Duffy Bruce, MD Authorized by: Duffy Bruce, MD   Critical care provider statement:    Critical  care time (minutes):  45   Critical care time was exclusive of:  Separately billable procedures and treating other patients and teaching time   Critical care was necessary to treat or prevent imminent or life-threatening deterioration of the following conditions:  Cardiac failure, circulatory failure, metabolic crisis and dehydration   Critical care was time spent personally by me on the following activities:  Development of treatment plan with patient or surrogate, discussions with consultants, evaluation of patient's response to treatment, examination of patient, obtaining history from patient or surrogate, ordering and performing treatments and interventions, ordering and review of laboratory studies, ordering and review of radiographic studies, pulse oximetry, re-evaluation of patient's condition and review of old charts   I assumed direction of critical care for this patient from another provider in my specialty: no     (including critical care time)  Medications Ordered in ED Medications  norepinephrine (LEVOPHED) 4 mg in dextrose 5 % 250 mL (0.016 mg/mL) infusion (5 mcg/min Intravenous New Bag/Given 07/20/18 1423)  sodium chloride 0.9 % bolus 1,000 mL (has no administration in time range)  sodium chloride 0.9 % bolus 1,000 mL (has no administration in time range)  metroNIDAZOLE (FLAGYL) IVPB 500 mg (has no administration in time range)  ceFEPIme (MAXIPIME) 2 g in sodium chloride 0.9 % 100 mL IVPB (has no administration in time range)  vancomycin (VANCOCIN) 2,000 mg in sodium chloride 0.9 % 500 mL IVPB (has no administration in time range)  ceFEPIme (MAXIPIME) 1 g in sodium chloride 0.9 % 100 mL IVPB (has no administration in time range)  vancomycin variable dose per unstable renal function (pharmacist dosing) (has no administration in time range)  sodium chloride 0.9 % bolus  1,000 mL (0 mLs Intravenous Stopped 07/20/18 1443)     Initial Impression / Assessment and Plan / ED Course  I  have reviewed the triage vital signs and the nursing notes.  Pertinent labs & imaging results that were available during my care of the patient were reviewed by me and considered in my medical decision making (see chart for details).     72 yo F with PMHx as above here initially as a CODE STROKE, but found to be hypotensive with AKI and Trop elevation. Pt increasingly hypotensive, bradycardic here. 3L IVF given, started on levophed gtt. Initial sepsis protocol delayed 2/2 unclear presentation, no reported infectious sx, and initially no documented hypotension. Once perfusing, however, on levophed, pt now awake, alert. She has some mild slurred speech that persists but markedly dry MM and this could be form dry mouth, versus mild dysarthria 2/2 hypoperfusion injury. Neuro is following. She now admits to taking double dose of her daily meds, which include nebivolol and multiple sedating meds. Will admit for likely accidental overdose from polypharmacy. Less likely sepsis/infectious. No STEMI on EKG.  Final Clinical Impressions(s) / ED Diagnoses   Final diagnoses:  Polypharmacy  Accidental overdose, initial encounter  Elevated troponin  Bradycardia    ED Discharge Orders    None       Duffy Bruce, MD 07/20/18 1530

## 2018-07-20 NOTE — Progress Notes (Signed)
Pharmacy Antibiotic Note  [name] is a 72 y.o. female admitted on 07/20/2018 with stroke symptoms.  Pharmacy has been consulted for vancomycin and cefepime dosing. Pt is afebrile and WBC is WNL. SCr is elevated, well above baseline.   Plan: Vancomycin 2gm IV x 1  Trend Scr for further doses Cefepime 2gm IV x 1 then 1gm IV Q24H F/u renal fxn, C&S, clinical status and vanc peak/trough PRN     Temp (24hrs), Avg:97.5 F (36.4 C), Min:97.5 F (36.4 C), Max:97.5 F (36.4 C)  Recent Labs  Lab 07/17/18 2323 07/20/18 1333 07/20/18 1336  WBC 7.2 7.9  --   CREATININE 1.21* 2.44* 2.50*    Estimated Creatinine Clearance: 23.9 mL/min (A) (by C-G formula based on SCr of 2.5 mg/dL (H)).    Allergies  Allergen Reactions  . Metformin And Related Diarrhea    (takes metformin at home w/ meals)  . Ace Inhibitors Other (See Comments)    unknown  . Erythromycin Hives  . Penicillins Hives    Has patient had a PCN reaction causing immediate rash, facial/tongue/throat swelling, SOB or lightheadedness with hypotension: Yes Has patient had a PCN reaction causing severe rash involving mucus membranes or skin necrosis: No Has patient had a PCN reaction that required hospitalization: No Has patient had a PCN reaction occurring within the last 10 years: No If all of the above answers are "NO", then may proceed with Cephalosporin use.   . Topamax [Topiramate] Rash    Antimicrobials this admission: Vanc 12/19>> Cefepime 12/19>> Flagyl 12/19>>  Dose adjustments this admission: N/A  Microbiology results: Pending  Thank you for allowing pharmacy to be a part of this patient's care.  [name], [name] 07/20/2018 3:14 PM

## 2018-07-20 NOTE — H&P (Signed)
See critical care consultation note 07/20/2018

## 2018-07-20 NOTE — ED Notes (Signed)
Levo drip turned down to 2.42mcg by ICU MD at 1525, then turned off at 1535, pt's BP dropped into the 94Z systolic, Levo drip turned back on by ICU MD at 1541 to 2.67mcg. Pt's BP now back up to 117/65. Pt's mentation unchanged. Pt can eat

## 2018-07-20 NOTE — Progress Notes (Signed)
Pt c/o severe 10/10 pain as she was eating her dinner tray. [employer] called and notified. Pts pain stopped as RN was going to get an EKG. Warren Lacy suggested this may be more of a GI issue than cardiac issue, as pt had a similar episode in the emergency department when eating a sandwich earlier today. ELINK to place new orders. EKG obtained and placed in chart. No changes from previous EKG.Will continue to monitor closely.  Clint Bolder, RN 07/20/18 8:14 PM

## 2018-07-20 NOTE — ED Notes (Addendum)
Levo started at 93mcg. Hannah RN unable to auscultate manual BP. Pt still alert.

## 2018-07-20 NOTE — ED Triage Notes (Signed)
Per [facility], pt from home. LSN [id number] when pt's friend(POA) called the pt and had a normal conversation. The friend then came to see the pt at 1300 and found the pt to have slurred speech and to be drowsy. Pt has an Therapist, sports that comes by Mon-Friday to help the pt with her medicaitons because she is noncompliant. This RN found all of the pt's medications to be gone from yesterday to today. Pt had gabapentin bottle at bedside but it had pills in it, unknown how may were gone compared to how many should be in it. Pt is axox4. No weakness, visual changes or any other sx other than slurred speech. CBG normal and BP 112/51

## 2018-07-20 NOTE — ED Notes (Addendum)
Pt has low BP, Dr Ellender Hose notified, pt has received 1L bolus already, Pt has 2 IVs, second liter almost finished as well. Pt is axox4.

## 2018-07-20 NOTE — Code Documentation (Signed)
72 yo female coming from home where she lives by herself with complaint of drowsiness and slurred speech. Pt has HX of DM, HTN, and depression. She was last known well at 1015 when talking on the phone with her friend. The Friend went over to her house after noon and noted that the patient was hard to arouse and had some slurred speech. EMS was called and EMS activated a Code Stroke. Stroke Team met patient upon arrival. Initial NIHSS 1 for slurred speech. CT Head negative for hemorrhage. Upon further assessment, pt reports taking both "morning and evening meds" already today. Pt has a home health nurse that comes by and places her medications in dividers. EMS noted that yesterday's meds and today's morning meds were missing from the compartment along with a bottle of gabapentin by her bedside. The patient took one set of meds this morning and the other one around lunch time. Pt taken back too room and placed on Cardiac monitor. HR noted to be in the 60s and BP 68/38. Fluids started and patient received 3 Liters urgently. No change in BP noted. EDP made aware during the giving of fluids and ordered Levophed. After medication started, pt's HR dropped into the 30s. Pt placed on the Zoll Pads and new EKG captured. EDP called and came to the bedside. Pt's HR came up to the 50s. NO change in mentation during this whole episode. Continues to be alert and oriented x4 and on the cardiac monitor. Care Handoff to Lennette Bihari, RN and Dr. Ellender Hose. Code Stroke cancelled due to not suspected.

## 2018-07-20 NOTE — ED Notes (Signed)
Pt is now chest pain free.  

## 2018-07-20 NOTE — ED Notes (Signed)
Pt has received 3 total Liters of NS now, on levophed drip at 44mcg. BP now normal. Axox4.

## 2018-07-20 NOTE — ED Notes (Signed)
CBG 162 

## 2018-07-20 NOTE — Consult Note (Signed)
NAME:  [name], MRN:  [id number], DOB:  May 09, 1946, LOS: 0 ADMISSION DATE:  07/20/2018, CONSULTATION DATE:  07/20/2018 REFERRING MD:  Ellender Hose, CHIEF COMPLAINT:  Hypotension.   HPI/course in hospital  72 year old woman consulted for hypotension.  History of medical non-compliance. Home health RN helped her take medications as prescribed yesterday.  Found by friend lethargic.  EMS was called and was found to have slurred speech so a code stroke was activated.  Neurological findings began to clear on arrival and her CT scan showed no acute abnormality.  Her change in mental status was therefore deemed to be due to to hypotension from her medications.  In spite of 3 L of IV fluids, she remained hypotensive and was started on low-dose  norepinephrine with improvement in her blood pressure.  Critical care was consulted for this reason to admit the patient.  Past Medical History  She,  has a past medical history of Acid reflux disease, Acute kidney injury ([place]), Anxiety, Depression, Diabetes mellitus without complication ([place]), Dizziness, Falls, Hydrocephalus ([place]), Hypertension, Hypotension, MVP (mitral valve prolapse), Sinus bradycardia, and Transaminitis.  Past Surgical History:  Procedure Laterality Date  . BRAIN SURGERY    . BREAST BIOPSY Left   . CHOLECYSTECTOMY    . ERCP N/A 02/28/2014   Procedure: ENDOSCOPIC RETROGRADE CHOLANGIOPANCREATOGRAPHY (ERCP);  Surgeon: Gatha Mayer, MD;  Location: [facility] ENDOSCOPY;  Service: Endoscopy;  Laterality: N/A;  talked to tiffany from or /ebp  . ESOPHAGOGASTRODUODENOSCOPY N/A 02/28/2014   Procedure: ESOPHAGOGASTRODUODENOSCOPY (EGD);  Surgeon: Gatha Mayer, MD;  Location: [facility] ENDOSCOPY;  Service: Endoscopy;  Laterality: N/A;  . VENTRICULOPERITONEAL SHUNT     right occipital     Review of Systems:   Review of Systems  All other systems reviewed and are negative.   Social History   reports that she has never smoked. She has never used smokeless  tobacco. She reports that she does not drink alcohol or use drugs.   Family History   Her family history includes Alcohol abuse in her father; Diabetes in her father; Heart disease in her mother; Hypertension in her mother; Stroke in her mother. There is no history of Cancer.   Allergies Allergies  Allergen Reactions  . Metformin And Related Diarrhea    (takes metformin at home w/ meals)  . Ace Inhibitors Other (See Comments)    unknown  . Erythromycin Hives  . Penicillins Hives    Has patient had a PCN reaction causing immediate rash, facial/tongue/throat swelling, SOB or lightheadedness with hypotension: Yes Has patient had a PCN reaction causing severe rash involving mucus membranes or skin necrosis: No Has patient had a PCN reaction that required hospitalization: No Has patient had a PCN reaction occurring within the last 10 years: No If all of the above answers are "NO", then may proceed with Cephalosporin use.   . Topamax [Topiramate] Rash     Home Medications  Prior to Admission medications   Medication Sig Start Date End Date Taking? Authorizing Provider  ARIPiprazole (ABILIFY) 2 MG tablet Take 1 tablet (2 mg total) by mouth daily. 06/28/18   Janith Lima, MD  atorvastatin (LIPITOR) 40 MG tablet Take 1 tablet (40 mg total) by mouth daily. 07/04/18   Janith Lima, MD  Azilsartan-Chlorthalidone (EDARBYCLOR) 40-25 MG TABS Take 1 tablet by mouth daily. 06/27/18   Janith Lima, MD  cyanocobalamin 2000 MCG tablet Take 1 tablet (2,000 mcg total) by mouth daily. 08/09/15   [name]  L, MD  glucose blood (ONE TOUCH ULTRA TEST) test strip USE TO CHECK BLOOD SUGAR 3 TIMES DAILY 04/18/18   Janith Lima, MD  Glycopyrrolate [facility] REFILL KIT) 25 MCG/ML SOLN Inhale 1 puff into the lungs 2 (two) times daily. 06/28/18   Janith Lima, MD  Glycopyrrolate [facility] MAGNAIR STARTER KIT) 25 MCG/ML SOLN Inhale 1 Act into the lungs 2 (two) times daily. 06/28/18   Janith Lima, MD  Insulin Pen Needle (NOVOFINE) 32G X 6 MM MISC 1 Act by Does not apply route daily. 10/13/16   Janith Lima, MD  JANUVIA 100 MG tablet TAKE 1 TABLET (100 MG TOTAL) BY MOUTH DAILY. 01/08/18   Janith Lima, MD  LORazepam (ATIVAN) 1 MG tablet Take 0.5 tablets (0.5 mg total) by mouth every 8 (eight) hours as needed for anxiety. 11/30/17   Doreatha Lew, MD  nebivolol (BYSTOLIC) 10 MG tablet Take 1 tablet (10 mg total) by mouth daily. 12/01/17   Doreatha Lew, MD  pantoprazole (PROTONIX) 40 MG tablet Take 1 tablet (40 mg total) by mouth daily. Patient taking differently: Take 40 mg by mouth daily as needed (reflux).  10/26/17   Janith Lima, MD  potassium chloride SA (KLOR-CON M20) 20 MEQ tablet Take 1 tablet (20 mEq total) by mouth 2 (two) times daily. 12/06/17 06/06/18  Janith Lima, MD  repaglinide (PRANDIN) 2 MG tablet TAKE 1 TABLET (2 MG TOTAL) BY MOUTH 3 (THREE) TIMES DAILY BEFORE MEALS. 01/09/18   Renato Shin, MD  SUMAtriptan (IMITREX) 50 MG tablet TAKE 1 TABLET BY MOUTH AS NEEDED FOR MIGRAINE, CAN REPEAT IN 2 HOURS IF HEADACHE PERSISTS 02/10/18   Janith Lima, MD  tizanidine (ZANAFLEX) 2 MG capsule TAKE ONE CAPSULE 3 TIMES A DAY 01/02/18   Janith Lima, MD  traZODone (DESYREL) 50 MG tablet Take 100 mg by mouth at bedtime 07/24/15   [provider]  Vilazodone HCl (VIIBRYD) 40 MG TABS Take by mouth daily.    [provider]     Interim history/subjective:  Now awake and denies any symptoms apart from thirst or hunger.  Objective   Blood pressure 135/82, pulse (!) 46, temperature (!) 97.5 F (36.4 C), temperature source Temporal, resp. rate 16, SpO2 100 %.        Intake/Output Summary (Last 24 hours) at 07/20/2018 1527 Last data filed at 07/20/2018 1427 Gross per 24 hour  Intake 3000 ml  Output -  Net 3000 ml   There were no vitals filed for this visit.  Examination: Physical Exam  Constitutional: She is oriented to person, place, and time.  She appears well-developed and well-nourished.  HENT:  Head: Normocephalic and atraumatic.  Dry mucus membrane  Eyes: Conjunctivae are normal.  Neck: Neck supple.  Cardiovascular: Normal rate, regular rhythm and normal heart sounds.  Respiratory: Breath sounds normal.  GI: Soft. Bowel sounds are normal.  Musculoskeletal:        General: No edema.  Neurological: She is alert and oriented to person, place, and time.  Shunt reservoir soft.  Skin: Skin is warm and dry.  Psychiatric: She has a normal mood and affect. Her behavior is normal. Judgment and thought content normal.     Ancillary tests (personally reviewed)  CBC: Recent Labs  Lab 07/17/18 2323 07/20/18 1333 07/20/18 1336  WBC 7.2 7.9  --   NEUTROABS  --  5.0  --   HGB 15.0 13.2 13.3  HCT 43.4 41.1  39.0  MCV 90.4 96.3  --   PLT 195 211  --     Basic Metabolic Panel: Recent Labs  Lab 07/17/18 2323 07/20/18 1333 07/20/18 1336  NA 144 135 135  K 3.2* 3.6 3.6  CL 102 99 101  CO2 28 23  --   GLUCOSE 202* 205* 200*  BUN 9 36* 34*  CREATININE 1.21* 2.44* 2.50*  CALCIUM 10.1 8.7*  --    GFR: Estimated Creatinine Clearance: 23.9 mL/min (A) (by C-G formula based on SCr of 2.5 mg/dL (H)). Recent Labs  Lab 07/17/18 2323 07/20/18 1333  WBC 7.2 7.9    Liver Function Tests: Recent Labs  Lab 07/17/18 2323 07/20/18 1333  AST 16 23  ALT 13 13  ALKPHOS 58 49  BILITOT 0.6 0.9  PROT 7.2 6.4*  ALBUMIN 3.7 3.5   Recent Labs  Lab 07/17/18 2323  LIPASE 26   No results for input(s): AMMONIA in the last 168 hours.  ABG    Component Value Date/Time   PHART 7.340 (L) 07/23/2014 1831   PCO2ART 46.4 (H) 07/23/2014 1831   PO2ART 78.0 (L) 07/23/2014 1831   HCO3 25.7 07/20/2018 1348   TCO2 27 07/20/2018 1348   ACIDBASEDEF 1.0 07/23/2014 1831   O2SAT 85.0 07/20/2018 1348     Coagulation Profile: Recent Labs  Lab 07/20/18 1333  INR 1.13    Cardiac Enzymes: Recent Labs  Lab 07/20/18 1352  TROPONINI  0.03*    HbA1C: Hemoglobin A1C  Date/Time Value Ref Range Status  01/12/2018 02:20 PM 6.0 (A) 4.0 - 5.6 % Final  10/01/2017 10:21 AM 6.9  Final  01/21/2016 7.9  Final   Hgb A1c MFr Bld  Date/Time Value Ref Range Status  06/07/2018 02:29 PM 7.1 (H) 4.6 - 6.5 % Final    Comment:    Glycemic Control Guidelines for People with Diabetes:Non Diabetic:  <6%Goal of Therapy: <7%Additional Action Suggested:  >8%   08/15/2017 12:02 PM 7.1 (H) 4.6 - 6.5 % Final    Comment:    Glycemic Control Guidelines for People with Diabetes:Non Diabetic:  <6%Goal of Therapy: <7%Additional Action Suggested:  >8%     CBG: Recent Labs  Lab 07/20/18 1330  GLUCAP 197*     Assessment & Plan:  Acutely altered mental status and hypotension due to polypharmacy. Doses likely incorrect due to prior non compliance.   Hypotension is improving and the patient is feeling better, but remains critically ill as BP still low with MAP<60 and continues to require norepinephrine to maintain adequate perfusion.  Will admit to ICU and continue IV fluids and wean vasopressors as tolerated  Acute on chronic renal failure should improve with fluids.  Hold home BP meds for now - can be restarted sequentially at discharge. SSI insulin for T2DM coverage.  Sepsis unlikely by screen and patient no source and improved too quickly.  Sepsis code cancelled  Best practice:  Diet: Diabetic Pain/Anxiety/Delirium protocol (if indicated): avoid narcotics. VAP protocol (if indicated): N/a DVT prophylaxis: heparin [place]. GI prophylaxis: N/a, treat home reflux. Glucose control: SSI. Mobility: Ad libe Code Status: Full  Family Communication: Patient updated. Disposition:    Critical care time: 30 min including, chart review, patient examination, assessing perfusion and titrating fluids and vasopressors.    Kipp Brood, MD [facility] ICU Physician [place]  Pager: [phone] Mobile: [phone] After hours:  [phone].  07/20/2018, 3:27 PM

## 2018-07-20 NOTE — ED Notes (Signed)
Pt given food. While pt eating pt began having severe central chest pain 10/10. Non radiating in nature, no associated sx. EKG captured and given to MD.

## 2018-07-21 DIAGNOSIS — I9589 Other hypotension: Principal | ICD-10-CM

## 2018-07-21 LAB — CBC
HCT: 39.5 % (ref 36.0–46.0)
Hemoglobin: 13.6 g/dL (ref 12.0–15.0)
MCH: 32.5 pg (ref 26.0–34.0)
MCHC: 34.4 g/dL (ref 30.0–36.0)
MCV: 94.3 fL (ref 80.0–100.0)
Platelets: 209 10*3/uL (ref 150–400)
RBC: 4.19 MIL/uL (ref 3.87–5.11)
RDW: 12.8 % (ref 11.5–15.5)
WBC: 8.3 10*3/uL (ref 4.0–10.5)
nRBC: 0 % (ref 0.0–0.2)

## 2018-07-21 LAB — URINALYSIS, MICROSCOPIC (REFLEX): RBC / HPF: NONE SEEN RBC/hpf (ref 0–5)

## 2018-07-21 LAB — BASIC METABOLIC PANEL
Anion gap: 12 (ref 5–15)
BUN: 23 mg/dL (ref 8–23)
CO2: 21 mmol/L — ABNORMAL LOW (ref 22–32)
Calcium: 8.6 mg/dL — ABNORMAL LOW (ref 8.9–10.3)
Chloride: 108 mmol/L (ref 98–111)
Creatinine, Ser: 1.43 mg/dL — ABNORMAL HIGH (ref 0.44–1.00)
GFR calc Af Amer: 43 mL/min — ABNORMAL LOW (ref >60)
GFR calc non Af Amer: 37 mL/min — ABNORMAL LOW (ref >60)
Glucose, Bld: 132 mg/dL — ABNORMAL HIGH (ref 70–99)
Potassium: 3.4 mmol/L — ABNORMAL LOW (ref 3.5–5.1)
Sodium: 141 mmol/L (ref 135–145)

## 2018-07-21 LAB — RAPID URINE DRUG SCREEN, HOSP PERFORMED
Amphetamines: NOT DETECTED
Barbiturates: NOT DETECTED
Benzodiazepines: NOT DETECTED
Cocaine: NOT DETECTED
Opiates: NOT DETECTED
Tetrahydrocannabinol: NOT DETECTED

## 2018-07-21 LAB — URINALYSIS, ROUTINE W REFLEX MICROSCOPIC
Glucose, UA: NEGATIVE mg/dL
Hgb urine dipstick: NEGATIVE
Ketones, ur: NEGATIVE mg/dL
Nitrite: NEGATIVE
Protein, ur: NEGATIVE mg/dL
Specific Gravity, Urine: 1.015 (ref 1.005–1.030)
pH: 6 (ref 5.0–8.0)

## 2018-07-21 LAB — MAGNESIUM: Magnesium: 2.1 mg/dL (ref 1.7–2.4)

## 2018-07-21 LAB — GLUCOSE, CAPILLARY
Glucose-Capillary: 119 mg/dL — ABNORMAL HIGH (ref 70–99)
Glucose-Capillary: 132 mg/dL — ABNORMAL HIGH (ref 70–99)
Glucose-Capillary: 142 mg/dL — ABNORMAL HIGH (ref 70–99)
Glucose-Capillary: 151 mg/dL — ABNORMAL HIGH (ref 70–99)

## 2018-07-21 MED ORDER — PANTOPRAZOLE SODIUM 40 MG PO TBEC: 40.0000 mg | DELAYED_RELEASE_TABLET | Freq: Every day | ORAL | Status: DC | PRN

## 2018-07-21 MED ORDER — PANTOPRAZOLE SODIUM 40 MG PO TBEC
40.0000 mg | DELAYED_RELEASE_TABLET | Freq: Every day | ORAL | Status: DC
  Administered 2018-07-22 – 2018-07-25 (×4): 40 mg via ORAL
  Filled 2018-07-21 (×4): qty 1

## 2018-07-21 MED ORDER — PANTOPRAZOLE SODIUM 40 MG PO TBEC
40.0000 mg | DELAYED_RELEASE_TABLET | Freq: Every day | ORAL | Status: DC
  Administered 2018-07-21 (×2): 40 mg via ORAL
  Filled 2018-07-21 (×2): qty 1

## 2018-07-21 MED ORDER — FENTANYL CITRATE (PF) 100 MCG/2ML IJ SOLN
25.0000 ug | Freq: Once | INTRAMUSCULAR | Status: AC
  Administered 2018-07-21: 25 ug via INTRAVENOUS
  Filled 2018-07-21: qty 2

## 2018-07-21 MED ORDER — SODIUM CHLORIDE 0.9% FLUSH
3.0000 mL | Freq: Two times a day (BID) | INTRAVENOUS | Status: DC
  Administered 2018-07-21 – 2018-07-25 (×8): 3 mL via INTRAVENOUS

## 2018-07-21 MED ORDER — MORPHINE SULFATE 15 MG PO TABS
15.0000 mg | ORAL_TABLET | ORAL | Status: DC | PRN
  Administered 2018-07-21 – 2018-07-25 (×4): 15 mg via ORAL
  Filled 2018-07-21 (×5): qty 1

## 2018-07-21 MED ORDER — VILAZODONE HCL 40 MG PO TABS
40.0000 mg | ORAL_TABLET | Freq: Every day | ORAL | Status: DC
  Administered 2018-07-21 – 2018-07-25 (×5): 40 mg via ORAL
  Filled 2018-07-21 (×6): qty 1

## 2018-07-21 MED ORDER — POTASSIUM CHLORIDE CRYS ER 20 MEQ PO TBCR
20.0000 meq | EXTENDED_RELEASE_TABLET | Freq: Two times a day (BID) | ORAL | Status: DC
  Administered 2018-07-21 – 2018-07-25 (×9): 20 meq via ORAL
  Filled 2018-07-21 (×9): qty 1

## 2018-07-21 NOTE — Progress Notes (Signed)
Pt stated her chest pain doesn't seem to be getting better with morphine and seems to be getting worse. Pt states pain is increased with slight pressure applied to chest. [facility] called and notified. One order received for fentanyl. Pt does report relief from fentanyl.  Clint Bolder, RN 07/21/18 6:00 AM

## 2018-07-21 NOTE — Progress Notes (Signed)
Pt transferred to 6E18 per w/c accompanied by RN with all belongings pt placed in bed and monitor applied by next floor nurses

## 2018-07-21 NOTE — Progress Notes (Addendum)
eLink Physician-Brief Progress Note Patient Name: [name] DOB: [birth year] MRN: [id number]   Date of Service  07/21/2018  HPI/Events of Note  Pt was resting comfortably when she experienced chest pain again.    Troponin repeated earlier 0.03. Repeat EKG done - no significant ST-T wave changes.  Pt is awake and alert on video assessment.  She has reproducible pain on palpation by the RN.  eICU Interventions  May be musculoskeletal in nature. Morphine prn ordered.     Intervention Category Intermediate Interventions: Pain - evaluation and management  Elsie Lincoln 07/21/2018, 3:33 AM    4:30 AM Pt again called the RN complaining of worsening pain since the morphine.  Plan> Trial of fentanyl IV.

## 2018-07-21 NOTE — Progress Notes (Signed)
Discussed pt's pain that she gets in her chest when she eats - Decided to take Protonix before she eats - said that another DR has told her he would like to stretch her feeding tube & that she's had this problem for awhile now- made sure that pt's head of bed was up high - pt on soft carb mod diet & reminded pt to chew her food well with sips of water every 2 bites - No c/o any chest pain with breakfast - said she only wanted a small amout of her french toast - also noted results of her urine sent - pt denies andy burning or discomfort pt says I wish I would get a feeling but I've had numerous urinary infections and I never know that I have one because I never feel bad down there.

## 2018-07-21 NOTE — Progress Notes (Addendum)
Pt's POA has found numbers of 2 living relatives [name] who lives in [place] who lives in [place] area [phone]

## 2018-07-21 NOTE — Progress Notes (Signed)
Pt complaining of acid-reflux. Pt has protonix ordered for 10am, RN called [facility] to verify that giving protonix early is okay. RN gave protonix early per [facility] orders.  0200: Pt stated she felt nauseous and began throwing up small amounts of sputum, stating it was stomach acid. Pt given zofran with some relief.   Will continue to monitor closely. Clint Bolder, RN  07/21/18 2:00 AM

## 2018-07-21 NOTE — Progress Notes (Signed)
Pt has been awake watching TV. Pt just complained of severe chest pain (mid sternal area) 10/10. Pt hadn't had anything to eat or drink and was just resting in bed. EKG obtained and placed in chart. [place] notified. Pt states now she also feels pain in her back which is different from the chest pain she felt before. Orders received for morphine. Will continue to monitor closely. Clint Bolder, RN 07/21/18 3:15 AM

## 2018-07-21 NOTE — Progress Notes (Signed)
NAME:  [name], MRN:  [id number], DOB:  July 28, 1946, LOS: 1 ADMISSION DATE:  07/20/2018, CONSULTATION DATE:  07/20/2018 REFERRING MD:  Ellender Hose, CHIEF COMPLAINT:  Hypotension.   HPI/course in hospital  72 year old woman consulted for hypotension.  History of medical non-compliance. Home health RN helped her take medications as prescribed yesterday.  Found by friend lethargic.  EMS was called and was found to have slurred speech so a code stroke was activated.  Neurological findings began to clear on arrival and her CT scan showed no acute abnormality.  Her change in mental status was therefore deemed to be due to to hypotension from her medications.  In spite of 3 L of IV fluids, she remained hypotensive and was started on low-dose  norepinephrine with improvement in her blood pressure.  Critical care was consulted for this reason to admit the patient.  Past Medical History  She,  has a past medical history of Acid reflux disease, Acute kidney injury ([place]), Anxiety, Depression, Diabetes mellitus without complication ([place]), Dizziness, Falls, Hydrocephalus ([place]), Hypertension, Hypotension, MVP (mitral valve prolapse), Sinus bradycardia, and Transaminitis.  Past Surgical History:  Procedure Laterality Date  . BRAIN SURGERY    . BREAST BIOPSY Left   . CHOLECYSTECTOMY    . ERCP N/A 02/28/2014   Procedure: ENDOSCOPIC RETROGRADE CHOLANGIOPANCREATOGRAPHY (ERCP);  Surgeon: Gatha Mayer, MD;  Location: [facility] ENDOSCOPY;  Service: Endoscopy;  Laterality: N/A;  talked to tiffany from or /ebp  . ESOPHAGOGASTRODUODENOSCOPY N/A 02/28/2014   Procedure: ESOPHAGOGASTRODUODENOSCOPY (EGD);  Surgeon: Gatha Mayer, MD;  Location: [facility] ENDOSCOPY;  Service: Endoscopy;  Laterality: N/A;  . VENTRICULOPERITONEAL SHUNT     right occipital       Social History   reports that she has never smoked. She has never used smokeless tobacco. She reports that she does not drink alcohol or use drugs.   Family History     Her family history includes Alcohol abuse in her father; Diabetes in her father; Heart disease in her mother; Hypertension in her mother; Stroke in her mother. There is no history of Cancer.   Allergies Allergies  Allergen Reactions  . Metformin And Related Diarrhea    (takes metformin at home w/ meals)  . Ace Inhibitors Other (See Comments)    unknown  . Erythromycin Hives  . Penicillins Hives    Has patient had a PCN reaction causing immediate rash, facial/tongue/throat swelling, SOB or lightheadedness with hypotension: Yes Has patient had a PCN reaction causing severe rash involving mucus membranes or skin necrosis: No Has patient had a PCN reaction that required hospitalization: No Has patient had a PCN reaction occurring within the last 10 years: No If all of the above answers are "NO", then may proceed with Cephalosporin use.   . Topamax [Topiramate] Rash     Home Medications  Prior to Admission medications   Medication Sig Start Date End Date Taking? Authorizing Provider  ARIPiprazole (ABILIFY) 2 MG tablet Take 1 tablet (2 mg total) by mouth daily. 06/28/18   Janith Lima, MD  atorvastatin (LIPITOR) 40 MG tablet Take 1 tablet (40 mg total) by mouth daily. 07/04/18   Janith Lima, MD  Azilsartan-Chlorthalidone (EDARBYCLOR) 40-25 MG TABS Take 1 tablet by mouth daily. 06/27/18   Janith Lima, MD  cyanocobalamin 2000 MCG tablet Take 1 tablet (2,000 mcg total) by mouth daily. 08/09/15   Janith Lima, MD  glucose blood (ONE TOUCH ULTRA TEST) test strip USE TO CHECK BLOOD  SUGAR 3 TIMES DAILY 04/18/18   Janith Lima, MD  Glycopyrrolate [facility] REFILL KIT) 25 MCG/ML SOLN Inhale 1 puff into the lungs 2 (two) times daily. 06/28/18   Janith Lima, MD  Glycopyrrolate [facility] MAGNAIR STARTER KIT) 25 MCG/ML SOLN Inhale 1 Act into the lungs 2 (two) times daily. 06/28/18   Janith Lima, MD  Insulin Pen Needle (NOVOFINE) 32G X 6 MM MISC 1 Act by Does not apply route  daily. 10/13/16   Janith Lima, MD  JANUVIA 100 MG tablet TAKE 1 TABLET (100 MG TOTAL) BY MOUTH DAILY. 01/08/18   Janith Lima, MD  LORazepam (ATIVAN) 1 MG tablet Take 0.5 tablets (0.5 mg total) by mouth every 8 (eight) hours as needed for anxiety. 11/30/17   Doreatha Lew, MD  nebivolol (BYSTOLIC) 10 MG tablet Take 1 tablet (10 mg total) by mouth daily. 12/01/17   Doreatha Lew, MD  pantoprazole (PROTONIX) 40 MG tablet Take 1 tablet (40 mg total) by mouth daily. Patient taking differently: Take 40 mg by mouth daily as needed (reflux).  10/26/17   Janith Lima, MD  potassium chloride SA (KLOR-CON M20) 20 MEQ tablet Take 1 tablet (20 mEq total) by mouth 2 (two) times daily. 12/06/17 06/06/18  Janith Lima, MD  repaglinide (PRANDIN) 2 MG tablet TAKE 1 TABLET (2 MG TOTAL) BY MOUTH 3 (THREE) TIMES DAILY BEFORE MEALS. 01/09/18   Renato Shin, MD  SUMAtriptan (IMITREX) 50 MG tablet TAKE 1 TABLET BY MOUTH AS NEEDED FOR MIGRAINE, CAN REPEAT IN 2 HOURS IF HEADACHE PERSISTS 02/10/18   Janith Lima, MD  tizanidine (ZANAFLEX) 2 MG capsule TAKE ONE CAPSULE 3 TIMES A DAY 01/02/18   Janith Lima, MD  traZODone (DESYREL) 50 MG tablet Take 100 mg by mouth at bedtime 07/24/15   [provider]  Vilazodone HCl (VIIBRYD) 40 MG TABS Take by mouth daily.    [provider]     Interim history/subjective:  Off pressors, on RA, asking for ativan Strange affect  Objective   Blood pressure (!) 116/55, pulse 81, temperature 97.8 F (36.6 C), temperature source Oral, resp. rate 20, height _0  (1.651 m), weight 99.4 kg, SpO2 98 %.        Intake/Output Summary (Last 24 hours) at 07/21/2018 1103 Last data filed at 07/21/2018 0600 Gross per 24 hour  Intake 7640.08 ml  Output 2900 ml  Net 4740.08 ml   Filed Weights   07/20/18 1711 07/21/18 0600  Weight: 99.4 kg 99.4 kg    Examination: General:HOH, pleasant, scratching at skin, strange affect, asking for ativan. Skin:Warm and  dry, brisk capillary refill, L antecub with ? Hematoma, cording, rash HEENT:normocephalic, sclera clear, mucus membranes moist, No LAD Neck:supple, no JVD, no bruits  Heart: S1, S2, RRR, No RMG Lungs:clear , few rales per bases, no rhonchi, or wheezes, diminished per bases NLG:XQJJ, non tender, + BS, do not palpate liver spleen or masses, Body mass index is 36.47 kg/m. Ext:no lower ext edema, 2+ pedal pulses, 2+ radial pulses, L antecub as above Neuro:alert and oriented X 3, MAE, follows commands, + facial symmetry, HOH  Ancillary tests (personally reviewed)  CBC: Recent Labs  Lab 07/17/18 2323 07/20/18 1333 07/20/18 1336 07/20/18 1842 07/21/18 0305  WBC 7.2 7.9  --  7.0 8.3  NEUTROABS  --  5.0  --   --   --   HGB 15.0 13.2 13.3 12.9 13.6  HCT 43.4 41.1 39.0 39.2  39.5  MCV 90.4 96.3  --  94.7 94.3  PLT 195 211  --  206 093    Basic Metabolic Panel: Recent Labs  Lab 07/17/18 2323 07/20/18 1333 07/20/18 1336 07/20/18 1842 07/21/18 0305  NA 144 135 135  --  141  K 3.2* 3.6 3.6  --  3.4*  CL 102 99 101  --  108  CO2 28 23  --   --  21*  GLUCOSE 202* 205* 200*  --  132*  BUN 9 36* 34*  --  23  CREATININE 1.21* 2.44* 2.50* 1.86* 1.43*  CALCIUM 10.1 8.7*  --   --  8.6*  MG  --   --   --   --  2.1   GFR: Estimated Creatinine Clearance: 42.2 mL/min (A) (by C-G formula based on SCr of 1.43 mg/dL (H)). Recent Labs  Lab 07/17/18 2323 07/20/18 1333 07/20/18 1842 07/21/18 0305  WBC 7.2 7.9 7.0 8.3    Liver Function Tests: Recent Labs  Lab 07/17/18 2323 07/20/18 1333  AST 16 23  ALT 13 13  ALKPHOS 58 49  BILITOT 0.6 0.9  PROT 7.2 6.4*  ALBUMIN 3.7 3.5   Recent Labs  Lab 07/17/18 2323  LIPASE 26   No results for input(s): AMMONIA in the last 168 hours.  ABG    Component Value Date/Time   PHART 7.340 (L) 07/23/2014 1831   PCO2ART 46.4 (H) 07/23/2014 1831   PO2ART 78.0 (L) 07/23/2014 1831   HCO3 25.7 07/20/2018 1348   TCO2 27 07/20/2018 1348    ACIDBASEDEF 1.0 07/23/2014 1831   O2SAT 85.0 07/20/2018 1348     Coagulation Profile: Recent Labs  Lab 07/20/18 1333  INR 1.13    Cardiac Enzymes: Recent Labs  Lab 07/20/18 1352 07/20/18 2049  TROPONINI 0.03* <0.03    HbA1C: Hemoglobin A1C  Date/Time Value Ref Range Status  01/12/2018 02:20 PM 6.0 (A) 4.0 - 5.6 % Final  10/01/2017 10:21 AM 6.9  Final  01/21/2016 7.9  Final   Hgb A1c MFr Bld  Date/Time Value Ref Range Status  06/07/2018 02:29 PM 7.1 (H) 4.6 - 6.5 % Final    Comment:    Glycemic Control Guidelines for People with Diabetes:Non Diabetic:  <6%Goal of Therapy: <7%Additional Action Suggested:  >8%   08/15/2017 12:02 PM 7.1 (H) 4.6 - 6.5 % Final    Comment:    Glycemic Control Guidelines for People with Diabetes:Non Diabetic:  <6%Goal of Therapy: <7%Additional Action Suggested:  >8%     CBG: Recent Labs  Lab 07/20/18 1330 07/20/18 1946 07/20/18 2336 07/21/18 0308 07/21/18 0736  GLUCAP 197* 137* 100* 119* 132*     Assessment & Plan:  Hypotension requiring vasopressor support 12/19>> resolved Most likely due to polypharmacy Last echo EF 65-70% Off pressors + 4 L Plan MAP goal is 65 Vaso pressors as needed Decrease IVF to [place] to hold home HTN regimen for now Culture urine  Vascular congestion per CXR Sats 98% on RA Plan CXR daily KVO IVF CXR 12/21 Consider diuretic if BP remains stable IS Q 1 while awake   Acutely altered mental status  due to polypharmacy.>> resolved Per case management through home care takes medication at her whim, is non-compliant Multiple self care issues Plan  Minimize sedation Will add Valazadone  home dose to ensure no withdrawal Evaluate home medication list Consult to case management / social work for assistance with multiple social and care issues Consider Psych consult  Acute  on Chronic Renal Failure Creatinine down trending Hypokalemia + 4 L Plan' BMET 12/21 Monitor UO Replete  electrolytes as needed   DMT2 Non-compliant with medication and insulin at home  Plan CBG Q 4 SSI HGB A1C  ID Afebrile WBC 8.3 Hx of urine infections ( Klebsiella pneumonia 03/2018) 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 Complains of chest pain that has negative  work up by cards Has Hx of esophogeal stricture Plan Continue Protonix Consider Barium swallow  L Antecubital firmness/ hematoma Cording Good pulses, perfusion Plan Vascular checks per nursing Q 4 Cap refill checks per nursing Q 4 Monitor    Best practice:  Diet: Diabetic Pain/Anxiety/Delirium protocol (if indicated): avoid narcotics. VAP protocol (if indicated): N/a DVT prophylaxis: heparin Why. GI prophylaxis: N/a, treat home reflux. Glucose control: SSI. Mobility: OOB Code Status: Full  Family Communication: Patient updated. Disposition:    Critical care time: 29 min       Magdalen Spatz, AGACNP-BC [place] Pager # [phone] After 3 pm [phone]  07/21/2018, 11:03 AM

## 2018-07-21 NOTE — Progress Notes (Signed)
Gave report to [facility]  RN on [facility] pt to transfer to [place] bed 18 with all belongings

## 2018-07-21 NOTE — Care Management Note (Signed)
Case Management Note Manya Silvas, RN MSN CM Transitions of Care 20M [place] [phone]  Patient Details  Name: MARYIAH OLVEY MRN: [id number] Date of Birth: Oct 28, 1945  Subjective/Objective:              Hypotension, iatrogenic      Action/Plan: PTA home alone with [facility]. Verified with Tommi Rumps that patient has Therapist, sports, PT, and SW services. Tommi Rumps stated that POA called HHRN and stated that patient is not safe to be home alone. Tommi Rumps expressed concerns about patient transitioning home alone, but stated if she does transition home [facility] services will need resumption of [place] orders. Noted SW consult for snf. Spoke with ICU RN who will discuss with attending consults for  PT, OT, and Psych. Will continue to follow for transition of care needs.    Expected Discharge Date:                  Expected Discharge Plan:  [place]  In-House Referral:  Clinical Social Work  Discharge planning Services  CM Consult  Post Acute Care Choice:    Choice offered to:     DME Arranged:    DME Agency:     HH Arranged:    [place] Agency:     Status of Service:  In process, will continue to follow  If discussed at Long Length of Stay Meetings, dates discussed:    Additional Comments:  Bartholomew Crews, RN 07/21/2018, 3:57 PM

## 2018-07-21 NOTE — Progress Notes (Addendum)
Spoke to pt's POA 4 times today - summation of calls follows: POA is not related but used to work with her - had not seen her for years but a mutual friend said that the patient wanted this Collie Siad to be her POA & she agreed. Per POA pt has a home that she was unable to take care of and moved into an apartment. POA says that the patient has not been paying her bills for at least 4 months and when she entered the apartment on 12/19  She found her sitting on the sofa slumped over and unresponsive. POA states that the apartment was extremely filthy with food caked on the counters and partial plates with food on tables and chairs. Was able to arouse her enough to ask her if she took her meds and she said yes but when the POA checked the weekly pill box no meds had been taken. The POA, Collie Siad says that this whole situation has been extremely stressful on her and would rather not continue trying to care for her and wants the patient to be safe. States that the patient still drives occasionally. Collie Siad has been buying her groceries for her and some medications. Collie Siad says the patients house is in [place] for sale and that the patient should be getting some money from [employer] in early January but owes a lot of money for past bills and vet care. POA also stated that Patient has not filed any taxes for the last 10 years ands her finances are a mess.     This am I asked pt about how she takes her insulin and she said some days she takes insulin and some days she takes pills for her diabetes. Her POA says they found her glucometer and there were no batteries in it.     Gave patient some of her psych meds and she said most days she doesn't take them because they make her feel like Derrill Memo - asked her if she wanted to hurt anyone and she said no. Patient has jerky hand and arm movements rubbing them up and down her body while talking very loud and fast. Her major concern this afternoon has been the safety of her cat - her POA and 2  other friends said they will make sure her cat is fed and taken care of while she is hospitalized.Becomes easily upset if they think the cat may be taken out of the apartment for care. Pt was assisted to [facility] and not very steady - extremely impulsive - refused any ambulation at this time - did not want to sit in recliner & jumped back into the bed

## 2018-07-21 NOTE — Telephone Encounter (Signed)
[name] requested the rx list but since pt is admitted at this time we will wait as the medication may change.   [name] stated the concerns that she has for pt safety and the safety of others. Informed [name] that pt may be able to be placed once dc'ed from [place] may by deemed as incompetent and could be placed easier.

## 2018-07-22 ENCOUNTER — Inpatient Hospital Stay (HOSPITAL_COMMUNITY): Payer: Medicare Other

## 2018-07-22 DIAGNOSIS — L8990 Pressure ulcer of unspecified site, unspecified stage: Secondary | ICD-10-CM

## 2018-07-22 LAB — COMPREHENSIVE METABOLIC PANEL
ALT: 13 U/L (ref 0–44)
AST: 18 U/L (ref 15–41)
Albumin: 3.4 g/dL — ABNORMAL LOW (ref 3.5–5.0)
Alkaline Phosphatase: 51 U/L (ref 38–126)
Anion gap: 9 (ref 5–15)
BUN: 11 mg/dL (ref 8–23)
CO2: 27 mmol/L (ref 22–32)
Calcium: 9.2 mg/dL (ref 8.9–10.3)
Chloride: 104 mmol/L (ref 98–111)
Creatinine, Ser: 1.09 mg/dL — ABNORMAL HIGH (ref 0.44–1.00)
GFR calc Af Amer: 59 mL/min — ABNORMAL LOW (ref >60)
GFR calc non Af Amer: 51 mL/min — ABNORMAL LOW (ref >60)
Glucose, Bld: 215 mg/dL — ABNORMAL HIGH (ref 70–99)
Potassium: 4 mmol/L (ref 3.5–5.1)
Sodium: 140 mmol/L (ref 135–145)
Total Bilirubin: 1 mg/dL (ref 0.3–1.2)
Total Protein: 6.9 g/dL (ref 6.5–8.1)

## 2018-07-22 LAB — GLUCOSE, CAPILLARY: Glucose-Capillary: 150 mg/dL — ABNORMAL HIGH (ref 70–99)

## 2018-07-22 LAB — CBC
HCT: 40.5 % (ref 36.0–46.0)
Hemoglobin: 13.7 g/dL (ref 12.0–15.0)
MCH: 31.9 pg (ref 26.0–34.0)
MCHC: 33.8 g/dL (ref 30.0–36.0)
MCV: 94.2 fL (ref 80.0–100.0)
Platelets: 213 10*3/uL (ref 150–400)
RBC: 4.3 MIL/uL (ref 3.87–5.11)
RDW: 12.8 % (ref 11.5–15.5)
WBC: 6.1 10*3/uL (ref 4.0–10.5)
nRBC: 0 % (ref 0.0–0.2)

## 2018-07-22 LAB — HEMOGLOBIN A1C
Hgb A1c MFr Bld: 7.2 % — ABNORMAL HIGH (ref 4.8–5.6)
Mean Plasma Glucose: 159.94 mg/dL

## 2018-07-22 MED ORDER — SODIUM CHLORIDE 0.9 % IV SOLN
1.0000 g | INTRAVENOUS | Status: DC
  Administered 2018-07-22: 1 g via INTRAVENOUS
  Filled 2018-07-22 (×2): qty 10

## 2018-07-22 NOTE — Evaluation (Signed)
Physical Therapy Evaluation Patient Details Name: ERICHA Wolf MRN: [id number] DOB: July 07, 1946 Today's Date: 07/22/2018   History of Present Illness  Pt is a 72 y.o. female admitted 07/20/18 after being found by friend, lethargic with slurred speech, code stroke activated; head CT showed no acute abnormality. AMS deemed due to hypotension from medications. PMH includes HTN, hydrocephalus, brain sx, falls, depression, anxiety.    Clinical Impression  Pt presents with an overall decrease in functional mobility secondary to above. PTA, pt indep with use of SPC/RW and lives alone; reports cluttered home environment and multiple falls in the past few months. Today, pt ambulate with RW and close min guard for balance; demonstrates decreased attention and very poor awareness, this in combination with poor balance strategies/postural reactions, places pt at significant increased risk for falls. Pt would benefit from continued acute PT services to maximize functional mobility and independence prior to d/c with SNF-level therapies.    Follow Up Recommendations SNF;Supervision for mobility/OOB    Equipment Recommendations  None recommended by PT    Recommendations for Other Services       Precautions / Restrictions Precautions Precautions: Fall Restrictions Weight Bearing Restrictions: No      Mobility  Bed Mobility Overal bed mobility: Independent                Transfers Overall transfer level: Needs assistance Equipment used: Rolling walker (2 wheeled) Transfers: Sit to/from Stand Sit to Stand: Min guard         General transfer comment: Min guard for safety/balance. Poor safety awareness, requiring frequent cues to keep RW close and to use safely  Ambulation/Gait Ambulation/Gait assistance: Min guard Gait Distance (Feet): 80 Feet Assistive device: Rolling walker (2 wheeled) Gait Pattern/deviations: Step-through pattern;Decreased stride length Gait velocity:  Decreased Gait velocity interpretation: 1.31 - 2.62 ft/sec, indicative of limited community ambulator General Gait Details: Ambulation with RW and min guard for balance; poor safety awareness and decreased attention placing pt at significant risk for falls. Frequent cues for safe use of RW  Stairs            Wheelchair Mobility    Modified Rankin (Stroke Patients Only)       Balance Overall balance assessment: Needs assistance   Sitting balance-Leahy Scale: Fair       Standing balance-Leahy Scale: Fair Standing balance comment: Can static stand without UE support and supervision                             Pertinent Vitals/Pain Pain Assessment: No/denies pain    Home Living Family/patient expects to be discharged to:: Private residence Living Arrangements: Alone Available Help at Discharge: Friend(s) Type of Home: Apartment Home Access: Stairs to enter Entrance Stairs-Rails: Psychiatric nurse of Steps: 3 Home Layout: One level Home Equipment: Shower seat;Grab bars - tub/shower;Grab bars - toilet;Cane - single point;Walker - 2 wheels;Adaptive equipment      Prior Function Level of Independence: Needs assistance   Gait / Transfers Assistance Needed: Drives. Uses RW at home; uses [facility] in community. Endorses falls. Owns cat  ADL's / Homemaking Assistance Needed: Reports indep with ADLs. Eats a lot of fast food. HHRN assists with medication management (uses daily pillboxes)  Comments: Reports living in a clutterred environment with magazines/other items all over floor resulting in falls     Hand Dominance   Dominant Hand: Right    Extremity/Trunk Assessment   Upper Extremity Assessment Upper  Extremity Assessment: Overall WFL for tasks assessed    Lower Extremity Assessment Lower Extremity Assessment: RLE deficits/detail;LLE deficits/detail RLE Deficits / Details: c/o chronic knee pain/stiffness; strength >3/5 RLE Coordination:  decreased gross motor LLE Deficits / Details: c/o chronic knee pain/stiffness; strength >3/5 LLE Coordination: decreased gross motor       Communication   Communication: HOH  Cognition Arousal/Alertness: Awake/alert Behavior During Therapy: WFL for tasks assessed/performed Overall Cognitive Status: No family/caregiver present to determine baseline cognitive functioning Area of Impairment: Attention;Safety/judgement;Awareness;Following commands                   Current Attention Level: Selective   Following Commands: Follows one step commands inconsistently Safety/Judgement: Decreased awareness of safety;Decreased awareness of deficits Awareness: Emergent   General Comments: Very talkative, poor attention. Aware of deficits, but poor problem solving when it comes to safety (i.e. able to explain she is falling on objects all over floor at home, but does not connect importance of cleaning floor to decrease tripping hazards)      General Comments      Exercises     Assessment/Plan    PT Assessment Patient needs continued PT services  PT Problem List Decreased strength;Decreased activity tolerance;Decreased balance;Decreased mobility;Decreased cognition;Decreased knowledge of use of DME;Decreased safety awareness       PT Treatment Interventions DME instruction;Gait training;Stair training;Functional mobility training;Patient/family education;Therapeutic activities;Therapeutic exercise;Balance training    PT Goals (Current goals can be found in the Care Plan section)  Acute Rehab PT Goals Patient Stated Goal: Return home PT Goal Formulation: With patient Time For Goal Achievement: 08/05/18 Potential to Achieve Goals: Fair    Frequency Min 2X/week   Barriers to discharge Decreased caregiver support;Inaccessible home environment      Co-evaluation PT/OT/SLP Co-Evaluation/Treatment: Yes Reason for Co-Treatment: Necessary to address cognition/behavior during  functional activity;For patient/therapist safety;To address functional/ADL transfers PT goals addressed during session: Mobility/safety with mobility;Balance;Proper use of DME         AM-PAC PT "6 Clicks" Mobility  Outcome Measure Help needed turning from your back to your side while in a flat bed without using bedrails?: None Help needed moving from lying on your back to sitting on the side of a flat bed without using bedrails?: None Help needed moving to and from a bed to a chair (including a wheelchair)?: A Little Help needed standing up from a chair using your arms (e.g., wheelchair or bedside chair)?: A Little Help needed to walk in hospital room?: A Little Help needed climbing 3-5 steps with a railing? : A Lot 6 Click Score: 19    End of Session Equipment Utilized During Treatment: Gait belt Activity Tolerance: Patient tolerated treatment well Patient left: in chair;with call bell/phone within reach;with chair alarm set;with nursing/sitter in room Nurse Communication: Mobility status PT Visit Diagnosis: Other abnormalities of gait and mobility (R26.89);Repeated falls (R29.6)    Time: 1028-1050 PT Time Calculation (min) (ACUTE ONLY): 22 min   Charges:   PT Evaluation $PT Eval Moderate Complexity: [place], PT, DPT Acute Rehabilitation Services  Pager [phone] Office [phone]  Derry Lory 07/22/2018, 11:21 AM

## 2018-07-22 NOTE — Evaluation (Signed)
Occupational Therapy Evaluation Patient Details Name: [name] MRN: [id number] DOB: 1946/03/07 Today's Date: 07/22/2018    History of Present Illness Pt is a 72 y.o. female admitted 07/20/18 after being found by friend, lethargic with slurred speech, code stroke activated; head CT showed no acute abnormality. AMS deemed due to hypotension from medications. PMH includes HTN, hydrocephalus, brain sx, falls, depression, anxiety.   Clinical Impression   Pt with decline in function and safety with ADLs and ADL mobility with decreased balance, endurance and cognition. Min guard for LB ADLs and ADL mobility for safety/balance. Poor safety awareness, requiring frequent cues to keep RW close and to use safely. Pt reports that she has magazines cluttered all over her den floor but doesn't comprehend th  need to clean up, organize or ask friends to help as this increases her risks for injury from falls. Pt lives at home alone with her cat. Pt would benefit from acute OT services to address impairments to maximize level of function and safety    Follow Up Recommendations  SNF;Supervision/Assistance - 24 hour    Equipment Recommendations  Other (comment)(TBD at next venue of care)    Recommendations for Other Services       Precautions / Restrictions Precautions Precautions: Fall Restrictions Weight Bearing Restrictions: No      Mobility Bed Mobility Overal bed mobility: Independent                Transfers Overall transfer level: Needs assistance Equipment used: Rolling walker (2 wheeled) Transfers: Sit to/from Stand Sit to Stand: Min guard         General transfer comment: Min guard for safety/balance. Poor safety awareness, requiring frequent cues to keep RW close and to use safely    Balance Overall balance assessment: Needs assistance   Sitting balance-Leahy Scale: Fair     Standing balance support: Bilateral upper extremity supported;Single extremity  supported;During functional activity Standing balance-Leahy Scale: Fair Standing balance comment: Can static stand without UE support and supervision                           ADL either performed or assessed with clinical judgement   ADL Overall ADL's : Needs assistance/impaired Eating/Feeding: Independent;Sitting   Grooming: Wash/dry hands;Wash/dry face;Standing;Min guard   Upper Body Bathing: Set up;Sitting   Lower Body Bathing: Min guard;Sit to/from stand   Upper Body Dressing : Set up;Sitting   Lower Body Dressing: Min guard;Sit to/from stand   Toilet Transfer: Min guard;RW;Ambulation;Grab bars;Cueing for safety   Toileting- Water quality scientist and Hygiene: Min guard;Sit to/from stand   Tub/ Shower Transfer: Agricultural engineer;Ambulation;Grab bars;Cueing for safety   Functional mobility during ADLs: Min guard;Cueing for safety General ADL Comments: mod verbal and physical cues to use RW, keeping hands on RW and to not ditch RW before reaching her destination     Vision Baseline Vision/History: Wears glasses Wears Glasses: Reading only Patient Visual Report: No change from baseline       Perception     Praxis      Pertinent Vitals/Pain Pain Assessment: No/denies pain     Hand Dominance Right   Extremity/Trunk Assessment Upper Extremity Assessment Upper Extremity Assessment: Overall WFL for tasks assessed   Lower Extremity Assessment Lower Extremity Assessment: Defer to PT evaluation RLE Deficits / Details: c/o chronic knee pain/stiffness; strength >3/5 RLE Coordination: decreased gross motor LLE Deficits / Details: c/o chronic knee pain/stiffness; strength >3/5 LLE Coordination: decreased gross  motor       Communication Communication Communication: HOH   Cognition Arousal/Alertness: Awake/alert Behavior During Therapy: WFL for tasks assessed/performed Overall Cognitive Status: No family/caregiver present to determine baseline  cognitive functioning Area of Impairment: Attention;Safety/judgement;Awareness;Following commands                   Current Attention Level: Selective   Following Commands: Follows one step commands inconsistently Safety/Judgement: Decreased awareness of safety;Decreased awareness of deficits Awareness: Emergent   General Comments: Very talkative, poor attention. Aware of deficits, but poor problem solving when it comes to safety (i.e. able to explain she is falling on objects all over floor at home, but does not connect importance of cleaning floor to decrease tripping hazards)   General Comments       Exercises     Shoulder Instructions      Home Living Family/patient expects to be discharged to:: Private residence Living Arrangements: Alone Available Help at Discharge: Friend(s) Type of Home: Apartment Home Access: Stairs to enter [employer] of Steps: 3 Entrance Stairs-Rails: Right;Left Home Layout: One level     Bathroom Shower/Tub: Corporate investment banker: [place]: Shower seat;Grab bars - tub/shower;Grab bars - toilet;Cane - single point;Walker - 2 wheels;Adaptive equipment Adaptive Equipment: [place]acher        Prior Functioning/Environment Level of Independence: Needs assistance  Gait / Transfers Assistance Needed: Drives. Uses RW at home; uses [facility] in community. Endorses falls. Owns cat ADL's / Homemaking Assistance Needed: Reports indep with ADLs. Eats a lot of fast food. HHRN assists with medication management (uses daily pillboxes)   Comments: Reports living in a clutterred environment with magazines/other items all over floor resulting in falls        OT Problem List: Decreased activity tolerance;Decreased cognition;Decreased knowledge of use of DME or AE;Decreased knowledge of precautions;Decreased safety awareness;Impaired balance (sitting and/or standing)      OT Treatment/Interventions:  Self-care/ADL training;Balance training;Therapeutic exercise;DME and/or AE instruction;Therapeutic activities;Patient/family education    OT Goals(Current goals can be found in the care plan section) Acute Rehab OT Goals Patient Stated Goal: Return home OT Goal Formulation: With patient Time For Goal Achievement: 08/05/18 Potential to Achieve Goals: Good ADL Goals Pt Will Perform Grooming: with set-up;with supervision;standing Pt Will Perform Lower Body Bathing: with supervision;with set-up;sit to/from stand Pt Will Perform Lower Body Dressing: with supervision;with set-up;sit to/from stand Pt Will Transfer to Toilet: with supervision;ambulating Pt Will Perform Toileting - Clothing Manipulation and hygiene: with supervision;sit to/from stand Pt Will Perform Tub/Shower Transfer: with supervision;ambulating;rolling walker;shower seat;3 in 1;grab bars  OT Frequency: Min 2X/week   Barriers to D/C: Decreased caregiver support          Co-evaluation PT/OT/SLP Co-Evaluation/Treatment: Yes Reason for Co-Treatment: Necessary to address cognition/behavior during functional activity;For patient/therapist safety;To address functional/ADL transfers PT goals addressed during session: Mobility/safety with mobility;Balance;Proper use of DME OT goals addressed during session: ADL's and self-care;Proper use of Adaptive equipment and DME      AM-PAC OT "6 Clicks" Daily Activity     Outcome Measure Help from another person eating meals?: None Help from another person taking care of personal grooming?: A Little Help from another person toileting, which includes using toliet, bedpan, or urinal?: A Little Help from another person bathing (including washing, rinsing, drying)?: A Little Help from another person to put on and taking off regular upper body clothing?: None Help from another person to put on and taking off regular lower  body clothing?: A Little 6 Click Score: 20   End of Session Equipment  Utilized During Treatment: Gait belt;Rolling walker  Activity Tolerance: Patient tolerated treatment well Patient left: in chair;with call bell/phone within reach;with chair alarm set  OT Visit Diagnosis: Unsteadiness on feet (R26.81);History of falling (Z91.81);Repeated falls (R29.6);Other symptoms and signs involving cognitive function                Time: 8768-1157 OT Time Calculation (min): 25 min Charges:  OT General Charges $OT Visit: 1 Visit OT Evaluation $OT Eval Moderate Complexity: 1 Mod    Britt Bottom 07/22/2018, 11:55 AM

## 2018-07-22 NOTE — Progress Notes (Signed)
PROGRESS NOTE    [name]  ZOX:[id number] DOB: 01-Nov-1945 DOA: 07/20/2018 PCP: Janith Lima, MD   Brief Narrative: 72 year old female with history of medical noncompliance, hypertension, diabetes mellitus, urinary tract infection, hyperlipidemia who was brought to the emergency department by EMS because of hypotension.  She was also found to be altered.  She was also found to have slurred speech so code stroke was called.  CT imaging did not reveal any stroke.  Despite being given IV fluids she remained hypotensive and she was started on low-dose norepinephrine with improvement in the blood pressure.  Hypotension thought to be secondary to polypharmacy.  Patient was initially admitted under PCCM service.  Took over by [facility] on 07/22/2018.  Current plan is to discharge to skilled nursing facility.  Social worker consulted.  Assessment & Plan:   Active Problems:   Hypotension, iatrogenic   Pressure injury of skin   Hypotension: Thought to be secondary to polypharmacy.  Home antihypertensives on hold.  She had to be started on low-dose vasopressors with improvement.  IV fluids stopped. Blood pressure currently stable. She was on Bystolic,Edarbyclor at home, held.  Acute metabolic encephalopathy: Most likely secondary to polypharmacy.  Currently mental status on baseline.  Case manager/social worker consulted for multiple social acute issues.  Diabetes type 2: Noncompliant with medication insulin at home.  Continue sliding-scale insulin here.  Hemoglobin A1c of 7.2.  She is on Januvia, repaglinide at home.  Acute kidney injury: Currently resolved  Chest pain: Very atypical.  Most likely musculoskeletal or associated with GERD.  Troponins negative.  She also has history of achalasia and reports of dysphagia/choking-like sensation.  She follows with GI as an outpatient.  Continue PPI  Arm bruising from BP cuff: Continue supportive care.  Improving  Anxiety/depression: She is on  multiple medications at home.  She is on Abilify, Ativan, tizanidine, trazodone, Vilazodone  Hyperlipidemia: Continue Lipitor on discharge.  Urinary tract infection: Urine culture growing significant amount of Klebsiella Pneumoniae.  She complains of vague discomfort on urination.  She has history of UTIs in the past.  We will continue ceftriaxone for now.  Patient is medically stable for discharge to SNF as soon as  the bed is available.        DVT prophylaxis:  Heparin [place] Code Status: Full Family Communication: None present at the bedside Disposition Plan: Skilled nursing facility   Consultants: PCCM  Procedures: None  Antimicrobials: None  Subjective: Patient seen and examined the bedside this afternoon.  Remains hemodynamically stable.    Denies any new complaints.  Complains of some atypical chest pain.  Constantly  moving her arms, appears jittery.  Objective: Vitals:   07/21/18 1800 07/21/18 1900 07/22/18 0100 07/22/18 0500  BP: (!) 151/60 (!) 150/72 (!) 145/65 140/62  Pulse: 73 79 85 65  Resp: 20 (!) 21    Temp:  98.5 F (36.9 C) 98.6 F (37 C) 98.5 F (36.9 C)  TempSrc:  Oral Oral Oral  SpO2: 100% 100% 94% 95%  Weight:      Height:        Intake/Output Summary (Last 24 hours) at 07/22/2018 1432 Last data filed at 07/22/2018 1420 Gross per 24 hour  Intake 580 ml  Output 1000 ml  Net -420 ml   Filed Weights   07/20/18 1711 07/21/18 0600  Weight: 99.4 kg 99.4 kg    Examination:  General exam: Appears calm and comfortable ,Not in distress,obese HEENT:PERRL,Oral mucosa moist, Ear/Nose normal on gross  exam Respiratory system: Bilateral equal air entry, normal vesicular breath sounds, no wheezes or crackles  Cardiovascular system: S1 & S2 heard, RRR. No JVD, murmurs, rubs, gallops or clicks. Trace pedal edema. Gastrointestinal system: Abdomen is nondistended, soft and nontender. No organomegaly or masses felt. Normal bowel sounds heard. Central nervous  system: Alert and oriented. No focal neurological deficits. Extremities: No edema, no clubbing ,no cyanosis, distal peripheral pulses palpable. Skin: No rashes, lesions or ulcers,no icterus ,no pallor MSK: Normal muscle bulk,tone ,power Psychiatry:  Mood & affect appropriate.     Data Reviewed: I have personally reviewed following labs and imaging studies  CBC: Recent Labs  Lab 07/17/18 2323 07/20/18 1333 07/20/18 1336 07/20/18 1842 07/21/18 0305 07/22/18 0356  WBC 7.2 7.9  --  7.0 8.3 6.1  NEUTROABS  --  5.0  --   --   --   --   HGB 15.0 13.2 13.3 12.9 13.6 13.7  HCT 43.4 41.1 39.0 39.2 39.5 40.5  MCV 90.4 96.3  --  94.7 94.3 94.2  PLT 195 211  --  206 209 706   Basic Metabolic Panel: Recent Labs  Lab 07/17/18 2323 07/20/18 1333 07/20/18 1336 07/20/18 1842 07/21/18 0305 07/22/18 0356  NA 144 135 135  --  141 140  K 3.2* 3.6 3.6  --  3.4* 4.0  CL 102 99 101  --  108 104  CO2 28 23  --   --  21* 27  GLUCOSE 202* 205* 200*  --  132* 215*  BUN 9 36* 34*  --  23 11  CREATININE 1.21* 2.44* 2.50* 1.86* 1.43* 1.09*  CALCIUM 10.1 8.7*  --   --  8.6* 9.2  MG  --   --   --   --  2.1  --    GFR: Estimated Creatinine Clearance: 55.3 mL/min (A) (by C-G formula based on SCr of 1.09 mg/dL (H)). Liver Function Tests: Recent Labs  Lab 07/17/18 2323 07/20/18 1333 07/22/18 0356  AST 16 23 18   ALT 13 13 13   ALKPHOS 58 49 51  BILITOT 0.6 0.9 1.0  PROT 7.2 6.4* 6.9  ALBUMIN 3.7 3.5 3.4*   Recent Labs  Lab 07/17/18 2323  LIPASE 26   No results for input(s): AMMONIA in the last 168 hours. Coagulation Profile: Recent Labs  Lab 07/20/18 1333  INR 1.13   Cardiac Enzymes: Recent Labs  Lab 07/20/18 1352 07/20/18 2049  TROPONINI 0.03* <0.03   BNP (last 3 results) No results for input(s): PROBNP in the last 8760 hours. HbA1C: Recent Labs    07/22/18 0356  HGBA1C 7.2*   CBG: Recent Labs  Lab 07/20/18 2336 07/21/18 0308 07/21/18 0736 07/21/18 1122  07/21/18 1502  GLUCAP 100* 119* 132* 142* 151*   Lipid Profile: No results for input(s): CHOL, HDL, LDLCALC, TRIG, CHOLHDL, LDLDIRECT in the last 72 hours. Thyroid Function Tests: No results for input(s): TSH, T4TOTAL, FREET4, T3FREE, THYROIDAB in the last 72 hours. Anemia Panel: No results for input(s): VITAMINB12, FOLATE, FERRITIN, TIBC, IRON, RETICCTPCT in the last 72 hours. Sepsis Labs: No results for input(s): PROCALCITON, LATICACIDVEN in the last 168 hours.  Recent Results (from the past 240 hour(s))  MRSA PCR Screening     Status: None   Collection Time: 07/20/18  5:06 PM  Result Value Ref Range Status   MRSA by PCR NEGATIVE NEGATIVE Final    Comment:        The GeneXpert MRSA Assay (FDA approved for NASAL specimens  only), is one component of a comprehensive MRSA colonization surveillance program. It is not intended to diagnose MRSA infection nor to guide or monitor treatment for MRSA infections. Performed at [facility], [place] [address], [place], [place] [address]   Culture, Urine     Status: Abnormal (Preliminary result)   Collection Time: 07/21/18  4:31 PM  Result Value Ref Range Status   Specimen Description URINE, CLEAN CATCH  Final   Special Requests   Final    NONE Normal Performed at [facility], [place] [address] [place], [place] [address]    Culture >=100,000 COLONIES/mL KLEBSIELLA PNEUMONIAE (A)  Final   Report Status PENDING  Incomplete         Radiology Studies: Dg Chest Port 1 View  Result Date: 07/22/2018 CLINICAL DATA:  Respiratory failure. EXAM: PORTABLE CHEST 1 VIEW COMPARISON:  07/20/2018 FINDINGS: Patient slightly rotated to the left. Right IJ central venous catheter unchanged. Lungs are adequately inflated and demonstrate mild prominence of the central pulmonary vasculature likely mild vascular congestion. No focal airspace consolidation or effusion. Mild stable cardiomegaly. Remainder of the exam is unchanged. IMPRESSION: Mild  stable cardiomegaly with evidence of mild vascular congestion. Right IJ central venous catheter unchanged. Electronically Signed   By: Marin Olp M.D.   On: 07/22/2018 08:50   Dg Chest Portable 1 View  Result Date: 07/20/2018 CLINICAL DATA:  Shortness of breath EXAM: PORTABLE CHEST 1 VIEW COMPARISON:  July 11, 2018 FINDINGS: The distal tip of a right central line is not well visualized but terminates at least at the level of the SVC. Mild increased interstitial markings in the lungs. No pneumothorax. No focal infiltrate. No other change. IMPRESSION: 1. A new right central line is difficult to see distally but likely terminates in the SVC. No pneumothorax. 2. Findings suggest pulmonary venous congestion/mild edema. Electronically Signed   By: Dorise Bullion III M.D   On: 07/20/2018 14:59        Scheduled Meds: . ARIPiprazole  2 mg Oral Daily  . heparin  5,000 Units Subcutaneous Q8H  . pantoprazole  40 mg Oral Daily  . potassium chloride  20 mEq Oral BID  . sodium chloride flush  3 mL Intravenous Q12H  . traZODone  50 mg Oral QHS  . Vilazodone HCl  40 mg Oral Daily  . cyanocobalamin  2,000 mcg Oral Daily   Continuous Infusions: . sodium chloride    . cefTRIAXone (ROCEPHIN)  IV       LOS: 2 days    Time spent: 35 mins.More than 50% of that time was spent in counseling and/or coordination of care.      Shelly Coss, MD [facility] Pager [phone]  If 7PM-7AM, please contact night-coverage [email or web address] Password [facility] 07/22/2018, 2:32 PM

## 2018-07-23 LAB — URINE CULTURE
Culture: >=100000 — AB
Special Requests: NORMAL

## 2018-07-23 LAB — GLUCOSE, CAPILLARY
Glucose-Capillary: 156 mg/dL — ABNORMAL HIGH (ref 70–99)
Glucose-Capillary: 174 mg/dL — ABNORMAL HIGH (ref 70–99)
Glucose-Capillary: 142 mg/dL — ABNORMAL HIGH (ref 70–99)
Glucose-Capillary: 182 mg/dL — ABNORMAL HIGH (ref 70–99)

## 2018-07-23 MED ORDER — HYDROCORTISONE 1 % EX CREA
TOPICAL_CREAM | Freq: Three times a day (TID) | CUTANEOUS | Status: DC
  Administered 2018-07-23: 21:00:00 via TOPICAL
  Administered 2018-07-23: 1 via TOPICAL
  Administered 2018-07-24 (×3): via TOPICAL
  Administered 2018-07-25: 1 via TOPICAL
  Filled 2018-07-23: qty 28

## 2018-07-23 MED ORDER — CEPHALEXIN 500 MG PO CAPS
500.0000 mg | ORAL_CAPSULE | Freq: Two times a day (BID) | ORAL | Status: DC
  Administered 2018-07-23 – 2018-07-25 (×5): 500 mg via ORAL
  Filled 2018-07-23 (×5): qty 1

## 2018-07-23 NOTE — Clinical Social Work Note (Signed)
Clinical Social Work Assessment  Patient Details  Name: [name] MRN: [id number] Date of Birth: [birth year]  Date of referral:  07/23/18               Reason for consult:  Facility Placement                Permission sought to share information with:    Permission granted to share information::     Name::        Agency::     Relationship::     Contact Information:     Housing/Transportation Living arrangements for the past 2 months:  Apartment Source of Information:  Patient, Power of Attorney(Sue- POA ) Patient Interpreter Needed:  None Criminal Activity/Legal Involvement Pertinent to Current Situation/Hospitalization:    Significant Relationships:  Other(Comment)(POA) Lives with:  Self Do you feel safe going back to the place where you live?  No Need for family participation in patient care:  Yes (Comment)  Care giving concerns:  Patients POA, Collie Siad, was in room at time of assessment and voiced her concerns with patient returning home alone- stating "she cant take care of herself".    Social Worker assessment / plan:  CSW met with patient and patients POA, Collie Siad, via bedside to discuss disposition plans. CSW informed both of PT's recommendation for SNF at discharge. Patient voiced being agreeable to SNF placement at this time. Per patient, she has been apprehensive to go to a SNF facility but is agreeable at this time. CSW faxed out information to facilities in the [place] area. Will provide with CMS ratings once patient receives bed offers.    Employment status:  Retired Nurse, adult PT Recommendations:  [place] / Referral to community resources:  [place]  Patient/Family's Response to care:  Patient appropriate and pleasant during conversation.  Patient/Family's Understanding of and Emotional Response to Diagnosis, Current Treatment, and Prognosis:  Understands disposition will be to SNF  placement and we are waiting on bed offers.   Emotional Assessment Appearance:  Appears stated age Attitude/Demeanor/Rapport:    Affect (typically observed):  Pleasant, Stable, Happy Orientation:  Oriented to Self, Oriented to Place, Oriented to Situation, Oriented to  Time Alcohol / Substance use:    Psych involvement (Current and /or in the community):  No (Comment)  Discharge Needs  Concerns to be addressed:  No discharge needs identified Readmission within the last 30 days:  No Current discharge risk:  None Barriers to Discharge:  No Barriers Identified   Weston Anna, LCSW 07/23/2018, 2:02 PM

## 2018-07-23 NOTE — Progress Notes (Signed)
PROGRESS NOTE    [name]  FXT:[id number] DOB: 04/12/1946 DOA: 07/20/2018 PCP: Janith Lima, MD   Brief Narrative: 72 year old female with history of medical noncompliance, hypertension, diabetes mellitus, urinary tract infection, hyperlipidemia who was brought to the emergency department by EMS because of hypotension.  She was also found to be altered.  She was also found to have slurred speech so code stroke was called.  CT imaging did not reveal any stroke.  Despite being given IV fluids she remained hypotensive and she was started on low-dose norepinephrine with improvement in the blood pressure.  Hypotension thought to be secondary to polypharmacy.  Patient was initially admitted under PCCM service.  Took over by [facility] on 07/22/2018.  Current plan is to discharge to skilled nursing facility.  Social worker consulted.  Assessment & Plan:   Active Problems:   Hypotension, iatrogenic   Pressure injury of skin   Hypotension: Thought to be secondary to polypharmacy.  Home antihypertensives on hold.  She had to be started on low-dose vasopressors with improvement.  IV fluids stopped. Blood pressure currently stable. She was on Bystolic,Edarbyclor at home, held.  Acute metabolic encephalopathy: Most likely secondary to polypharmacy.  Currently mental status on baseline.  Case manager/social worker consulted for multiple social acute issues.  Diabetes type 2: Noncompliant with medication insulin at home.  Continue sliding-scale insulin here.  Hemoglobin A1c of 7.2.  She is on Januvia, repaglinide at home.  Acute kidney injury: Currently resolved  Chest pain: Very atypical.  Most likely musculoskeletal or associated with GERD.  Troponins negative.  She also has history of achalasia and reports of dysphagia/choking-like sensation.  She follows with GI as an outpatient.  Continue PPI  Arm bruising from BP cuff: Continue supportive care.  Improving  Anxiety/depression: She is on  multiple medications at home.  She is on Abilify, Ativan, tizanidine, trazodone, Vilazodone .  She follows with her family physician for her prescriptions.  Hyperlipidemia: Continue Lipitor on discharge.  Urinary tract infection: Urine culture growing significant amount of pansensitive Klebsiella Pneumoniae.  She complains of vague discomfort on urination.  She has history of UTIs in the past.  Continue keflex.  Patient is medically stable for discharge to SNF as soon as  the bed is available.        DVT prophylaxis:  Heparin  Code Status: Full Family Communication: None present at the bedside Disposition Plan: Skilled nursing facility   Consultants: PCCM  Procedures: None  Antimicrobials: None  Subjective: Patient seen and examined the bedside this afternoon.  Remains hemodynamically stable.    Denies any new complaints.      Objective: Vitals:   07/22/18 1948 07/23/18 0011 07/23/18 0318 07/23/18 0809  BP: (!) 158/84 (!) 144/94 (!) 151/92 (!) 157/85  Pulse:  79 80 68  Resp:   (!) 22 18  Temp:  98.1 F (36.7 C) 98.3 F (36.8 C) 98.4 F (36.9 C)  TempSrc:  Oral Oral Oral  SpO2:  94% 95% 92%  Weight:   95.3 kg   Height:        Intake/Output Summary (Last 24 hours) at 07/23/2018 1025 Last data filed at 07/23/2018 1004 Gross per 24 hour  Intake 820.46 ml  Output 600 ml  Net 220.46 ml   Filed Weights   07/20/18 1711 07/21/18 0600 07/23/18 0318  Weight: 99.4 kg 99.4 kg 95.3 kg    Examination:  General exam: Appears calm and comfortable ,Not in distress,obese HEENT:PERRL,Oral mucosa moist, Ear/Nose  normal on gross exam Respiratory system: Bilateral equal air entry, normal vesicular breath sounds, no wheezes or crackles  Cardiovascular system: S1 & S2 heard, RRR. No JVD, murmurs, rubs, gallops or clicks. Trace pedal edema. Gastrointestinal system: Abdomen is nondistended, soft and nontender. No organomegaly or masses felt. Normal bowel sounds heard. Central  nervous system: Alert and oriented. No focal neurological deficits. Extremities: No edema, no clubbing ,no cyanosis, distal peripheral pulses palpable. Skin: No rashes, lesions or ulcers,no icterus ,no pallor MSK: Normal muscle bulk,tone ,power Psychiatry:  Appears jittery sometimes.  Constantly moving her upper arms.  Looks very happy and excited.    Data Reviewed: I have personally reviewed following labs and imaging studies  CBC: Recent Labs  Lab 07/17/18 2323 07/20/18 1333 07/20/18 1336 07/20/18 1842 07/21/18 0305 07/22/18 0356  WBC 7.2 7.9  --  7.0 8.3 6.1  NEUTROABS  --  5.0  --   --   --   --   HGB 15.0 13.2 13.3 12.9 13.6 13.7  HCT 43.4 41.1 39.0 39.2 39.5 40.5  MCV 90.4 96.3  --  94.7 94.3 94.2  PLT 195 211  --  206 209 947   Basic Metabolic Panel: Recent Labs  Lab 07/17/18 2323 07/20/18 1333 07/20/18 1336 07/20/18 1842 07/21/18 0305 07/22/18 0356  NA 144 135 135  --  141 140  K 3.2* 3.6 3.6  --  3.4* 4.0  CL 102 99 101  --  108 104  CO2 28 23  --   --  21* 27  GLUCOSE 202* 205* 200*  --  132* 215*  BUN 9 36* 34*  --  23 11  CREATININE 1.21* 2.44* 2.50* 1.86* 1.43* 1.09*  CALCIUM 10.1 8.7*  --   --  8.6* 9.2  MG  --   --   --   --  2.1  --    GFR: Estimated Creatinine Clearance: 54 mL/min (A) (by C-G formula based on SCr of 1.09 mg/dL (H)). Liver Function Tests: Recent Labs  Lab 07/17/18 2323 07/20/18 1333 07/22/18 0356  AST 16 23 18   ALT 13 13 13   ALKPHOS 58 49 51  BILITOT 0.6 0.9 1.0  PROT 7.2 6.4* 6.9  ALBUMIN 3.7 3.5 3.4*   Recent Labs  Lab 07/17/18 2323  LIPASE 26   No results for input(s): AMMONIA in the last 168 hours. Coagulation Profile: Recent Labs  Lab 07/20/18 1333  INR 1.13   Cardiac Enzymes: Recent Labs  Lab 07/20/18 1352 07/20/18 2049  TROPONINI 0.03* <0.03   BNP (last 3 results) No results for input(s): PROBNP in the last 8760 hours. HbA1C: Recent Labs    07/22/18 0356  HGBA1C 7.2*   CBG: Recent Labs    Lab 07/21/18 0736 07/21/18 1122 07/21/18 1502 07/22/18 2109 07/23/18 0743  GLUCAP 132* 142* 151* 150* 156*   Lipid Profile: No results for input(s): CHOL, HDL, LDLCALC, TRIG, CHOLHDL, LDLDIRECT in the last 72 hours. Thyroid Function Tests: No results for input(s): TSH, T4TOTAL, FREET4, T3FREE, THYROIDAB in the last 72 hours. Anemia Panel: No results for input(s): VITAMINB12, FOLATE, FERRITIN, TIBC, IRON, RETICCTPCT in the last 72 hours. Sepsis Labs: No results for input(s): PROCALCITON, LATICACIDVEN in the last 168 hours.  Recent Results (from the past 240 hour(s))  MRSA PCR Screening     Status: None   Collection Time: 07/20/18  5:06 PM  Result Value Ref Range Status   MRSA by PCR NEGATIVE NEGATIVE Final    Comment:  The GeneXpert MRSA Assay (FDA approved for NASAL specimens only), is one component of a comprehensive MRSA colonization surveillance program. It is not intended to diagnose MRSA infection nor to guide or monitor treatment for MRSA infections. Performed at [facility], [place] [address], [place], [place] [address]   Culture, Urine     Status: Abnormal   Collection Time: 07/21/18  4:31 PM  Result Value Ref Range Status   Specimen Description URINE, CLEAN CATCH  Final   Special Requests   Final    NONE Normal Performed at [facility], [place] [address], [place], [place] [address]    Culture >=100,000 COLONIES/mL KLEBSIELLA PNEUMONIAE (A)  Final   Report Status 07/23/2018 FINAL  Final   Organism ID, Bacteria KLEBSIELLA PNEUMONIAE (A)  Final      Susceptibility   Klebsiella pneumoniae - MIC*    AMPICILLIN RESISTANT Resistant     CEFAZOLIN <=4 SENSITIVE Sensitive     CEFTRIAXONE <=1 SENSITIVE Sensitive     CIPROFLOXACIN <=0.25 SENSITIVE Sensitive     GENTAMICIN <=1 SENSITIVE Sensitive     IMIPENEM <=0.25 SENSITIVE Sensitive     NITROFURANTOIN 32 SENSITIVE Sensitive     TRIMETH/SULFA <=20 SENSITIVE Sensitive     AMPICILLIN/SULBACTAM <=2  SENSITIVE Sensitive     PIP/TAZO <=4 SENSITIVE Sensitive     Extended ESBL NEGATIVE Sensitive     * >=100,000 COLONIES/mL KLEBSIELLA PNEUMONIAE         Radiology Studies: Dg Chest Port 1 View  Result Date: 07/22/2018 CLINICAL DATA:  Respiratory failure. EXAM: PORTABLE CHEST 1 VIEW COMPARISON:  07/20/2018 FINDINGS: Patient slightly rotated to the left. Right IJ central venous catheter unchanged. Lungs are adequately inflated and demonstrate mild prominence of the central pulmonary vasculature likely mild vascular congestion. No focal airspace consolidation or effusion. Mild stable cardiomegaly. Remainder of the exam is unchanged. IMPRESSION: Mild stable cardiomegaly with evidence of mild vascular congestion. Right IJ central venous catheter unchanged. Electronically Signed   By: Marin Olp M.D.   On: 07/22/2018 08:50        Scheduled Meds: . ARIPiprazole  2 mg Oral Daily  . cephALEXin  500 mg Oral Q12H  . heparin  5,000 Units Subcutaneous Q8H  . hydrocortisone cream   Topical TID  . pantoprazole  40 mg Oral Daily  . potassium chloride  20 mEq Oral BID  . sodium chloride flush  3 mL Intravenous Q12H  . traZODone  50 mg Oral QHS  . Vilazodone HCl  40 mg Oral Daily  . cyanocobalamin  2,000 mcg Oral Daily   Continuous Infusions: . sodium chloride       LOS: 3 days    Time spent: 35 mins.More than 50% of that time was spent in counseling and/or coordination of care.      Shelly Coss, MD [facility] Pager [phone]  If 7PM-7AM, please contact night-coverage [email or web address] Password [facility] 07/23/2018, 10:25 AM

## 2018-07-23 NOTE — [place] FL2 (Addendum)
[place] MEDICAID FL2 LEVEL OF CARE SCREENING TOOL     IDENTIFICATION  Patient Name: [name] Birthdate: June 12, 1946 Sex: female Admission Date (Current Location): 07/20/2018  [facility] and [place] Number:  Herbalist and Address:  [facility], [place] [address], [place], [place] [address]      Provider Number: [id number]  Attending Physician Name and Address:  Shelly Coss, MD  Relative Name and Phone Number:       Current Level of Care: Hospital Recommended Level of Care: [place] Prior Approval Number:    Date Approved/Denied:   PASRR Number:   [id number]  Discharge Plan: SNF    Current Diagnoses: Patient Active Problem List   Diagnosis Date Noted  . Pressure injury of skin 07/22/2018  . Hypotension, iatrogenic 07/20/2018  . Chest pressure 07/11/2018  . Intractable episodic headache 06/27/2018  . Frequent UTI 03/08/2018  . Carpal tunnel syndrome of left wrist 03/07/2018  . Chronic midline low back pain 09/22/2017  . Current severe episode of major depressive disorder with psychotic features ([place]) 08/15/2017  . Simple chronic bronchitis ([place]) 06/28/2017  . Excessive daytime sleepiness 12/21/2016  . Migraine without aura and with status migrainosus, not intractable 03/04/2016  . Left ventricular diastolic dysfunction, NYHA class 2 01/21/2016  . Morbid obesity ([place]) 01/21/2016  . 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 Cervical radiculitis 08/07/2015  . GAD (generalized anxiety disorder) 07/18/2014  . Primary osteoarthritis of both knees 05/23/2014  . Visit for screening mammogram 05/22/2014  . Hyperlipidemia with target LDL less than 100 05/22/2014  . Falls frequently 04/02/2014  . Vitamin D deficiency 12/28/2013  . Estrogen deficiency 12/27/2013  . Routine general medical examination at a health care facility 12/27/2013  . Type II diabetes mellitus with manifestations ([place]) 08/15/2013  . OAB  (overactive bladder) 08/15/2013  . Vitamin B12 deficiency anemia 06/05/2010  . HYDROCEPHALUS, NORMAL PRESSURE 06/03/2010  . Essential hypertension 05/15/2008    Orientation RESPIRATION BLADDER Height & Weight     Self, Time, Situation, Place  Normal Continent Weight: 95.3 kg Height:  5\' 5"  (165.1 cm)  BEHAVIORAL SYMPTOMS/MOOD NEUROLOGICAL BOWEL NUTRITION STATUS      Continent Diet(see DC summary )  AMBULATORY STATUS COMMUNICATION OF NEEDS Skin   Limited Assist Verbally Other (Comment)(Stage 1- left proximal)                       Personal Care Assistance Level of Assistance  Bathing, Feeding, Dressing Bathing Assistance: Limited assistance Feeding assistance: Independent Dressing Assistance: Limited assistance     Functional Limitations Info             SPECIAL CARE FACTORS FREQUENCY  PT (By licensed PT), OT (By licensed OT)     PT Frequency: 5x/week OT Frequency: 5x/week            Contractures      Additional Factors Info  Allergies, Code Status Code Status Info: Full code  Allergies Info: METFORMIN AND RELATED, ACE INHIBITORS, ERYTHROMYCIN, PENICILLINS, TOPAMAX TOPIRAMATE            Current Medications (07/23/2018):  This is the current hospital active medication list Current Facility-Administered Medications  Medication Dose Route Frequency Provider Last Rate Last Dose  . 0.9 %  sodium chloride infusion  250 mL Intravenous Continuous Agarwala, Ravi, MD      . acetaminophen (TYLENOL) tablet 650 mg  650 mg Oral Q4H PRN Kipp Brood, MD  650 mg at 07/22/18 1601  . ARIPiprazole (ABILIFY) tablet 2 mg  2 mg Oral Daily Kipp Brood, MD   2 mg at 07/23/18 0920  . cephALEXin (KEFLEX) capsule 500 mg  500 mg Oral Q12H Adhikari, Amrit, MD   500 mg at 07/23/18 0921  . heparin injection 5,000 Units  5,000 Units Subcutaneous Q8H Kipp Brood, MD   5,000 Units at 07/23/18 0515  . hydrocortisone cream 1 %   Topical TID Shelly Coss, MD   1 application at  68/61/68 1102  . LORazepam (ATIVAN) tablet 0.5 mg  0.5 mg Oral Q8H PRN Kipp Brood, MD   0.5 mg at 07/22/18 1938  . morphine (MSIR) tablet 15 mg  15 mg Oral Q4H PRN Elsie Lincoln, MD   15 mg at 07/23/18 0955  . ondansetron (ZOFRAN) injection 4 mg  4 mg Intravenous Q6H PRN Kipp Brood, MD   4 mg at 07/21/18 1947  . pantoprazole (PROTONIX) EC tablet 40 mg  40 mg Oral Daily Harvel Quale, [place]   40 mg at 07/23/18 3729  . potassium chloride SA (K-DUR,KLOR-CON) CR tablet 20 mEq  20 mEq Oral BID Magdalen Spatz, NP   20 mEq at 07/23/18 0920  . sodium chloride flush (NS) 0.9 % injection 3 mL  3 mL Intravenous Q12H Mannam, Praveen, MD   3 mL at 07/23/18 0922  . traZODone (DESYREL) tablet 50 mg  50 mg Oral QHS Kipp Brood, MD   50 mg at 07/22/18 1939  . Vilazodone HCl (VIIBRYD) TABS 40 mg  40 mg Oral Daily Magdalen Spatz, NP   40 mg at 07/23/18 0211  . vitamin B-12 (CYANOCOBALAMIN) tablet 2,000 mcg  2,000 mcg Oral Daily Kipp Brood, MD   2,000 mcg at 07/23/18 0920     Discharge Medications: Please see discharge summary for a list of discharge medications.  Relevant Imaging Results:  Relevant Lab Results:   Additional Information  SSN: [id number]  Weston Anna, LCSW

## 2018-07-24 ENCOUNTER — Encounter: Payer: Self-pay | Admitting: Internal Medicine

## 2018-07-24 LAB — GLUCOSE, CAPILLARY
Glucose-Capillary: 179 mg/dL — ABNORMAL HIGH (ref 70–99)
Glucose-Capillary: 166 mg/dL — ABNORMAL HIGH (ref 70–99)
Glucose-Capillary: 152 mg/dL — ABNORMAL HIGH (ref 70–99)
Glucose-Capillary: 211 mg/dL — ABNORMAL HIGH (ref 70–99)

## 2018-07-24 NOTE — Progress Notes (Addendum)
1:56 pm Patient's POA indicated a preference for [facility]. CSW discussed this with patient at bedside. Patient is agreeable to [facility]. Asked [facility] admissions to [facility]. Will follow to support with discharge planning.  12:12 pm CSW met with patient at bedside and also spoke to patient's POA, Collie Siad, on the phone. Gave patient and Collie Siad SNF bed offers. Will follow up for choice. Patient will need [facility] authorization prior to admitting to SNF. SNF of choice to initiate auth. CSW to follow.  Estanislado Emms, LCSW [phone]

## 2018-07-24 NOTE — Care Management Important Message (Signed)
Important Message  Patient Details  Name: [name] MRN: [id number] Date of Birth: [birth year]   Medicare Important Message Given:  Yes    Barb Merino Hillery Bhalla 07/24/2018, 4:07 PM

## 2018-07-24 NOTE — Progress Notes (Signed)
PROGRESS NOTE    [name]  HKV:[id number] DOB: 1946/04/18 DOA: 07/20/2018 PCP: Janith Lima, MD   Brief Narrative: 72 year old female with history of medical noncompliance, hypertension, diabetes mellitus, urinary tract infection, hyperlipidemia who was brought to the emergency department by EMS because of hypotension.  She was also found to be altered.  She was also found to have slurred speech so code stroke was called.  CT imaging did not reveal any stroke.  Despite being given IV fluids she remained hypotensive and she was started on low-dose norepinephrine with improvement in the blood pressure.  Hypotension thought to be secondary to polypharmacy.  Patient was initially admitted under PCCM service.  Took over by [facility] on 07/22/2018.  Current plan is to discharge to skilled nursing facility.  Social worker consulted.She is stable for discharge.  Assessment & Plan:   Active Problems:   Hypotension, iatrogenic   Pressure injury of skin   Hypotension: Thought to be secondary to polypharmacy.  Home antihypertensives on hold.  She had to be started on low-dose vasopressors with improvement.  IV fluids stopped. Blood pressure currently stable. She was on Bystolic,Edarbyclor at home, held.  Acute metabolic encephalopathy: Most likely secondary to polypharmacy.  Currently mental status on baseline.  Case manager/social worker consulted for multiple social acute issues.  Diabetes type 2: Noncompliant with medication insulin at home.  Continue sliding-scale insulin here.  Hemoglobin A1c of 7.2.  She is on Januvia, repaglinide at home.  Acute kidney injury: Currently resolved  Chest pain: Very atypical.  Most likely musculoskeletal or associated with GERD.  Troponins negative.  She also has history of achalasia and reports of dysphagia/choking-like sensation.  She follows with GI as an outpatient.  Continue PPI  Arm bruising from BP cuff: Continue supportive care.   Improving  Anxiety/depression: She is on multiple medications at home.  She is on Abilify, Ativan, tizanidine, trazodone, Vilazodone .  She follows with her family physician for her prescriptions.  Hyperlipidemia: Continue Lipitor on discharge.  Urinary tract infection: Urine culture growing significant amount of pansensitive Klebsiella Pneumoniae.  She complains of vague discomfort on urination.  She has history of UTIs in the past.  Continue keflex to complete course.  Patient is medically stable for discharge to SNF as soon as  the bed is available.        DVT prophylaxis:  Heparin  Code Status: Full Family Communication: None present at the bedside Disposition Plan: Skilled nursing facility   Consultants: PCCM  Procedures: None  Antimicrobials: None  Subjective: Patient seen and examined the bedside this afternoon.  Remains hemodynamically stable.    Denies any new complaints.    Comfortable. No new issues.  Objective: Vitals:   07/23/18 2000 07/23/18 2338 07/24/18 0624 07/24/18 0744  BP: 125/76 126/74 129/84 120/72  Pulse: 83 77 91 84  Resp:      Temp: 98.4 F (36.9 C) 98 F (36.7 C) 98.4 F (36.9 C) 98 F (36.7 C)  TempSrc: Oral Oral Oral   SpO2: 96% 93% 94% 95%  Weight:   94.5 kg   Height:        Intake/Output Summary (Last 24 hours) at 07/24/2018 1115 Last data filed at 07/24/2018 0625 Gross per 24 hour  Intake 720 ml  Output 1575 ml  Net -855 ml   Filed Weights   07/21/18 0600 07/23/18 0318 07/24/18 0624  Weight: 99.4 kg 95.3 kg 94.5 kg    Examination:  General exam: Appears calm and  comfortable ,Not in distress,obese HEENT:PERRL,Oral mucosa moist, Ear/Nose normal on gross exam Respiratory system: Bilateral equal air entry, normal vesicular breath sounds, no wheezes or crackles  Cardiovascular system: S1 & S2 heard, RRR. No JVD, murmurs, rubs, gallops or clicks. Trace pedal edema. Gastrointestinal system: Abdomen is nondistended, soft and  nontender. No organomegaly or masses felt. Normal bowel sounds heard. Central nervous system: Alert and oriented. No focal neurological deficits. Extremities: No edema, no clubbing ,no cyanosis, distal peripheral pulses palpable. Skin: No rashes, lesions or ulcers,no icterus ,no pallor MSK: Normal muscle bulk,tone ,power Psychiatry:  Appears jittery sometimes.  Constantly moving her upper arms during conversation.  Looks very happy .    Data Reviewed: I have personally reviewed following labs and imaging studies  CBC: Recent Labs  Lab 07/17/18 2323 07/20/18 1333 07/20/18 1336 07/20/18 1842 07/21/18 0305 07/22/18 0356  WBC 7.2 7.9  --  7.0 8.3 6.1  NEUTROABS  --  5.0  --   --   --   --   HGB 15.0 13.2 13.3 12.9 13.6 13.7  HCT 43.4 41.1 39.0 39.2 39.5 40.5  MCV 90.4 96.3  --  94.7 94.3 94.2  PLT 195 211  --  206 209 329   Basic Metabolic Panel: Recent Labs  Lab 07/17/18 2323 07/20/18 1333 07/20/18 1336 07/20/18 1842 07/21/18 0305 07/22/18 0356  NA 144 135 135  --  141 140  K 3.2* 3.6 3.6  --  3.4* 4.0  CL 102 99 101  --  108 104  CO2 28 23  --   --  21* 27  GLUCOSE 202* 205* 200*  --  132* 215*  BUN 9 36* 34*  --  23 11  CREATININE 1.21* 2.44* 2.50* 1.86* 1.43* 1.09*  CALCIUM 10.1 8.7*  --   --  8.6* 9.2  MG  --   --   --   --  2.1  --    GFR: Estimated Creatinine Clearance: 53.8 mL/min (A) (by C-G formula based on SCr of 1.09 mg/dL (H)). Liver Function Tests: Recent Labs  Lab 07/17/18 2323 07/20/18 1333 07/22/18 0356  AST 16 23 18   ALT 13 13 13   ALKPHOS 58 49 51  BILITOT 0.6 0.9 1.0  PROT 7.2 6.4* 6.9  ALBUMIN 3.7 3.5 3.4*   Recent Labs  Lab 07/17/18 2323  LIPASE 26   No results for input(s): AMMONIA in the last 168 hours. Coagulation Profile: Recent Labs  Lab 07/20/18 1333  INR 1.13   Cardiac Enzymes: Recent Labs  Lab 07/20/18 1352 07/20/18 2049  TROPONINI 0.03* <0.03   BNP (last 3 results) No results for input(s): PROBNP in the last  8760 hours. HbA1C: Recent Labs    07/22/18 0356  HGBA1C 7.2*   CBG: Recent Labs  Lab 07/23/18 0743 07/23/18 1124 07/23/18 1628 07/23/18 2138 07/24/18 0741  GLUCAP 156* 174* 142* 182* 179*   Lipid Profile: No results for input(s): CHOL, HDL, LDLCALC, TRIG, CHOLHDL, LDLDIRECT in the last 72 hours. Thyroid Function Tests: No results for input(s): TSH, T4TOTAL, FREET4, T3FREE, THYROIDAB in the last 72 hours. Anemia Panel: No results for input(s): VITAMINB12, FOLATE, FERRITIN, TIBC, IRON, RETICCTPCT in the last 72 hours. Sepsis Labs: No results for input(s): PROCALCITON, LATICACIDVEN in the last 168 hours.  Recent Results (from the past 240 hour(s))  MRSA PCR Screening     Status: None   Collection Time: 07/20/18  5:06 PM  Result Value Ref Range Status   MRSA by PCR NEGATIVE  NEGATIVE Final    Comment:        The GeneXpert MRSA Assay (FDA approved for NASAL specimens only), is one component of a comprehensive MRSA colonization surveillance program. It is not intended to diagnose MRSA infection nor to guide or monitor treatment for MRSA infections. Performed at [facility], [place] [address] [place], [place] [address]   Culture, Urine     Status: Abnormal   Collection Time: 07/21/18  4:31 PM  Result Value Ref Range Status   Specimen Description URINE, CLEAN CATCH  Final   Special Requests   Final    NONE Normal Performed at [facility], [place] [address] [place], [place] [address]    Culture >=100,000 COLONIES/mL KLEBSIELLA PNEUMONIAE (A)  Final   Report Status 07/23/2018 FINAL  Final   Organism ID, Bacteria KLEBSIELLA PNEUMONIAE (A)  Final      Susceptibility   Klebsiella pneumoniae - MIC*    AMPICILLIN RESISTANT Resistant     CEFAZOLIN <=4 SENSITIVE Sensitive     CEFTRIAXONE <=1 SENSITIVE Sensitive     CIPROFLOXACIN <=0.25 SENSITIVE Sensitive     GENTAMICIN <=1 SENSITIVE Sensitive     IMIPENEM <=0.25 SENSITIVE Sensitive     NITROFURANTOIN 32  SENSITIVE Sensitive     TRIMETH/SULFA <=20 SENSITIVE Sensitive     AMPICILLIN/SULBACTAM <=2 SENSITIVE Sensitive     PIP/TAZO <=4 SENSITIVE Sensitive     Extended ESBL NEGATIVE Sensitive     * >=100,000 COLONIES/mL KLEBSIELLA PNEUMONIAE         Radiology Studies: No results found.      Scheduled Meds: . ARIPiprazole  2 mg Oral Daily  . cephALEXin  500 mg Oral Q12H  . heparin  5,000 Units Subcutaneous Q8H  . hydrocortisone cream   Topical TID  . pantoprazole  40 mg Oral Daily  . potassium chloride  20 mEq Oral BID  . sodium chloride flush  3 mL Intravenous Q12H  . traZODone  50 mg Oral QHS  . Vilazodone HCl  40 mg Oral Daily  . cyanocobalamin  2,000 mcg Oral Daily   Continuous Infusions: . sodium chloride       LOS: 4 days    Time spent: 35 mins.More than 50% of that time was spent in counseling and/or coordination of care.      Shelly Coss, MD [facility] Pager [phone]  If 7PM-7AM, please contact night-coverage [email or web address] Password [facility] 07/24/2018, 11:15 AM

## 2018-07-24 NOTE — Progress Notes (Signed)
Occupational Therapy Treatment Patient Details Name: [name] MRN: [id number] DOB: 02-12-1946 Today's Date: 07/24/2018    History of present illness Pt is a 72 y.o. female admitted 07/20/18 after being found by friend, lethargic with slurred speech, code stroke activated; head CT showed no acute abnormality. AMS deemed due to hypotension from medications. PMH includes HTN, hydrocephalus, brain sx, falls, depression, anxiety.   OT comments  Pt agreeable to therapy. Pt performing bed mobility with modified independence. Pt performing ADL functional transfers with minguardA; ADL functional mobility a community distance with RW and minA for avoiding obstacles and finding her way back to her room. Pt performing LB dressing with MInA in standing with RW. Pt overall minA for UB ADL and ModA overall for LB ADL. Pt continues to progress and continues to have LLE knee pain- tylenol provided from RN to assist with pain- no pain reported during session. Pt requires cues for safety as pt very anxious and hard of hearing. Pt would benefit from continued OT skilled services for ADL, mobility and safety. Recommend: SNF.    Follow Up Recommendations  SNF;Supervision/Assistance - 24 hour    Equipment Recommendations       Recommendations for Other Services      Precautions / Restrictions Precautions Precautions: Fall Restrictions Weight Bearing Restrictions: No       Mobility Bed Mobility Overal bed mobility: Modified Independent                Transfers Overall transfer level: Needs assistance Equipment used: Rolling walker (2 wheeled) Transfers: Sit to/from Stand Sit to Stand: Min guard         General transfer comment: Min guard for safety/balance. Poor safety awareness, requiring frequent cues to keep RW close and to use safely    Balance Overall balance assessment: Needs assistance Sitting-balance support: Feet supported Sitting balance-Leahy Scale: Fair      Standing balance support: Bilateral upper extremity supported Standing balance-Leahy Scale: Fair                             ADL either performed or assessed with clinical judgement   ADL Overall ADL's : Needs assistance/impaired                     Lower Body Dressing: Min guard;Sit to/from stand               Functional mobility during ADLs: Min guard;Cueing for safety General ADL Comments: ModA overall for safety and use of RW for mobility for ADLs. decreased attention span requiring cueing for safety.     Vision       Perception     Praxis      Cognition Arousal/Alertness: Awake/alert Behavior During Therapy: WFL for tasks assessed/performed Overall Cognitive Status: No family/caregiver present to determine baseline cognitive functioning Area of Impairment: Attention;Safety/judgement;Awareness;Following commands                   Current Attention Level: Selective   Following Commands: Follows one step commands inconsistently Safety/Judgement: Decreased awareness of safety;Decreased awareness of deficits     General Comments: Hard of hearing requiring increased time for processing commands.        Exercises     Shoulder Instructions       General Comments L knee pain     Pertinent Vitals/ Pain       Pain Assessment: No/denies pain  Home Living  Prior Functioning/Environment              Frequency  Min 2X/week        Progress Toward Goals  OT Goals(current goals can now be found in the care plan section)  Progress towards OT goals: Progressing toward goals  Acute Rehab OT Goals Patient Stated Goal: Return home OT Goal Formulation: With patient Time For Goal Achievement: 08/05/18 Potential to Achieve Goals: Good ADL Goals Pt Will Perform Grooming: with set-up;with supervision;standing Pt Will Perform Lower Body Bathing: with supervision;with  set-up;sit to/from stand Pt Will Perform Lower Body Dressing: with supervision;with set-up;sit to/from stand Pt Will Transfer to Toilet: with supervision;ambulating Pt Will Perform Toileting - Clothing Manipulation and hygiene: with supervision;sit to/from stand Pt Will Perform Tub/Shower Transfer: with supervision;ambulating;rolling walker;shower seat;3 in 1;grab bars  Plan Frequency remains appropriate    Co-evaluation                 AM-PAC OT "6 Clicks" Daily Activity     Outcome Measure   Help from another person eating meals?: None Help from another person taking care of personal grooming?: A Little Help from another person toileting, which includes using toliet, bedpan, or urinal?: A Little Help from another person bathing (including washing, rinsing, drying)?: A Lot Help from another person to put on and taking off regular upper body clothing?: A Little Help from another person to put on and taking off regular lower body clothing?: A Lot 6 Click Score: 17    End of Session Equipment Utilized During Treatment: Gait belt;Rolling walker  OT Visit Diagnosis: Unsteadiness on feet (R26.81);Muscle weakness (generalized) (M62.81);Repeated falls (R29.6)   Activity Tolerance     Patient Left     Nurse Communication          Time: 1450-1520 OT Time Calculation (min): 30 min  Charges: OT General Charges $OT Visit: 1 Visit OT Treatments $Self Care/Home Management : 8-22 mins $Therapeutic Activity: 8-22 mins  Darryl Nestle) Marsa Aris OTR/L Acute Rehabilitation Services Pager: [phone] Office: [phone]    Fredda Hammed 07/24/2018, 3:36 PM

## 2018-07-25 LAB — GLUCOSE, CAPILLARY
Glucose-Capillary: 173 mg/dL — ABNORMAL HIGH (ref 70–99)
Glucose-Capillary: 175 mg/dL — ABNORMAL HIGH (ref 70–99)

## 2018-07-25 MED ORDER — ARIPIPRAZOLE 2 MG PO TABS: 2.0000 mg | ORAL_TABLET | Freq: Every day | ORAL | 0 refills | Status: DC

## 2018-07-25 MED ORDER — CEPHALEXIN 500 MG PO CAPS: 500.0000 mg | ORAL_CAPSULE | Freq: Two times a day (BID) | ORAL | 0 refills | Status: DC

## 2018-07-25 MED ORDER — LORAZEPAM 1 MG PO TABS: 0.5000 mg | ORAL_TABLET | Freq: Three times a day (TID) | ORAL | 0 refills | Status: DC | PRN

## 2018-07-25 MED ORDER — TIZANIDINE HCL 2 MG PO CAPS: 2.0000 mg | ORAL_CAPSULE | Freq: Three times a day (TID) | ORAL | Status: DC | PRN

## 2018-07-25 MED ORDER — AMLODIPINE BESYLATE 5 MG PO TABS: 5.0000 mg | ORAL_TABLET | Freq: Every day | ORAL | 11 refills | Status: DC

## 2018-07-25 NOTE — Discharge Instructions (Signed)
Urinary Tract Infection, Adult A urinary tract infection (UTI) is an infection of any part of the urinary tract. The urinary tract includes:  The kidneys.  The ureters.  The bladder.  The urethra. These organs make, store, and get rid of pee (urine) in the body. What are the causes? This is caused by germs (bacteria) in your genital area. These germs grow and cause swelling (inflammation) of your urinary tract. What increases the risk? You are more likely to develop this condition if:  You have a small, thin tube (catheter) to drain pee.  You cannot control when you pee or poop (incontinence).  You are female, and: ? You use these methods to prevent pregnancy: ? A medicine that kills sperm (spermicide). ? A device that blocks sperm (diaphragm). ? You have low levels of a female hormone (estrogen). ? You are pregnant.  You have genes that add to your risk.  You are sexually active.  You take antibiotic medicines.  You have trouble peeing because of: ? A prostate that is bigger than normal, if you are female. ? A blockage in the part of your body that drains pee from the bladder (urethra). ? A kidney stone. ? A nerve condition that affects your bladder (neurogenic bladder). ? Not getting enough to drink. ? Not peeing often enough.  You have other conditions, such as: ? Diabetes. ? A weak disease-fighting system (immune system). ? Sickle cell disease. ? Gout. ? Injury of the spine. What are the signs or symptoms? Symptoms of this condition include:  Needing to pee right away (urgently).  Peeing often.  Peeing small amounts often.  Pain or burning when peeing.  Blood in the pee.  Pee that smells bad or not like normal.  Trouble peeing.  Pee that is cloudy.  Fluid coming from the vagina, if you are female.  Pain in the belly or lower back. Other symptoms include:  Throwing up (vomiting).  No urge to eat.  Feeling mixed up (confused).  Being tired  and grouchy (irritable).  A fever.  Watery poop (diarrhea). How is this treated? This condition may be treated with:  Antibiotic medicine.  Other medicines.  Drinking enough water. Follow these instructions at home:  Medicines  Take over-the-counter and prescription medicines only as told by your doctor.  If you were prescribed an antibiotic medicine, take it as told by your doctor. Do not stop taking it even if you start to feel better. General instructions  Make sure you: ? Pee until your bladder is empty. ? Do not hold pee for a long time. ? Empty your bladder after sex. ? Wipe from front to back after pooping if you are a female. Use each tissue one time when you wipe.  Drink enough fluid to keep your pee pale yellow.  Keep all follow-up visits as told by your doctor. This is important. Contact a doctor if:  You do not get better after 1-2 days.  Your symptoms go away and then come back. Get help right away if:  You have very bad back pain.  You have very bad pain in your lower belly.  You have a fever.  You are sick to your stomach (nauseous).  You are throwing up. Summary  A urinary tract infection (UTI) is an infection of any part of the urinary tract.  This condition is caused by germs in your genital area.  There are many risk factors for a UTI. These include having a small, thin   tube to drain pee and not being able to control when you pee or poop.  Treatment includes antibiotic medicines for germs.  Drink enough fluid to keep your pee pale yellow. This information is not intended to replace advice given to you by your health care provider. Make sure you discuss any questions you have with your health care provider. Document Released: 01/05/2008 Document Revised: 01/26/2018 Document Reviewed: 01/26/2018 Elsevier Interactive Patient Education  2019 [employer] Inc.  

## 2018-07-25 NOTE — Progress Notes (Signed)
CSW lvm with [facility] to get an update on [facility] insurance auth as patient is medically stable to dc.   CSW will continue to follow up.   [place], [place]

## 2018-07-25 NOTE — Discharge Summary (Signed)
Physician Discharge Summary  [name] EXN:[id number] DOB: January 28, 1946 DOA: 07/20/2018  PCP: Janith Lima, MD  Admit date: 07/20/2018 Discharge date: 07/25/2018  Admitted From: Home Disposition:  SNF  Discharge Condition:Stable CODE STATUS:FULL Diet recommendation: Heart Healthy  Brief/Interim Summary:  72 year old female with history of medical noncompliance, hypertension, diabetes mellitus, urinary tract infection, hyperlipidemia who was brought to the emergency department by EMS because of hypotension.  She was also found to be altered.  She was also found to have slurred speech so code stroke was called.  CT imaging did not reveal any stroke.  Despite being given IV fluids she remained hypotensive and she was started on low-dose norepinephrine with improvement in the blood pressure.  Hypotension thought to be secondary to polypharmacy.  Patient was initially admitted under PCCM service.  Took over by [facility] on 07/22/2018.  Current plan is to discharge to skilled nursing facility.  Social worker consulted.She is stable for discharge.  Following problems were addressed during her hospitalization:   Hypotension: Thought to be secondary to polypharmacy.  Home antihypertensives on hold.  She had to be started on low-dose vasopressors with improvement.  IV fluids stopped. Blood pressure currently stable. She was on Bystolic,Edarbyclor at home, held. Started on Norvasc 5 mg daily.  Acute metabolic encephalopathy: Most likely secondary to polypharmacy.  Currently mental status on baseline.  Case manager/social worker consulted for multiple social acute issues.  Diabetes type 2: Noncompliant with medication insulin at home.  Continue sliding-scale insulin here.  Hemoglobin A1c of 7.2.  She is on Januvia, repaglinide at home.  Acute kidney injury: Currently resolved  Chest pain: Very atypical.  Most likely musculoskeletal or associated with GERD.  Troponins negative.  She also  has history of achalasia and reports of dysphagia/choking-like sensation.  She follows with GI as an outpatient.  Continue PPI  Arm bruising from BP cuff: Continue supportive care.  Improving  Anxiety/depression: She is on multiple medications at home.  She is on Abilify, Ativan, tizanidine, trazodone, Vilazodone .  She follows with her family physician for her prescriptions.She has H/O severe anxiety.  Hyperlipidemia: Continue Lipitor on discharge.  Urinary tract infection: Urine culture growing significant amount of pansensitive Klebsiella Pneumoniae.  She complains of vague discomfort on urination.  She has history of UTIs in the past.  Continue keflex to complete course.  Patient is medically stable for discharge to SNF as soon as  the bed is available. Discharge Diagnoses:  Active Problems:   Hypotension, iatrogenic   Pressure injury of skin    Discharge Instructions  Discharge Instructions    Diet - low sodium heart healthy   Complete by:  As directed    Discharge instructions   Complete by:  As directed    1) Take prescribed medications as instructed. 2)Follow up with your psychiatrist as an outpatient.   Increase activity slowly   Complete by:  As directed      Allergies as of 07/25/2018      Reactions   Metformin And Related Diarrhea   (takes metformin at home w/ meals)   Ace Inhibitors Other (See Comments)   unknown   Erythromycin Hives   Penicillins Hives   Has patient had a PCN reaction causing immediate rash, facial/tongue/throat swelling, SOB or lightheadedness with hypotension: Yes Has patient had a PCN reaction causing severe rash involving mucus membranes or skin necrosis: No Has patient had a PCN reaction that required hospitalization: No Has patient had a PCN reaction occurring within  the last 10 years: No If all of the above answers are "NO", then may proceed with Cephalosporin use.   Topamax [topiramate] Rash      Medication List    STOP  taking these medications   Azilsartan-Chlorthalidone 40-25 MG Tabs Commonly known as:  EDARBYCLOR   nebivolol 10 MG tablet Commonly known as:  BYSTOLIC   potassium chloride SA 20 MEQ tablet Commonly known as:  KLOR-CON M20     TAKE these medications   amLODipine 5 MG tablet Commonly known as:  NORVASC Take 1 tablet (5 mg total) by mouth daily.   ARIPiprazole 2 MG tablet Commonly known as:  ABILIFY Take 1 tablet (2 mg total) by mouth daily.   atorvastatin 40 MG tablet Commonly known as:  LIPITOR Take 1 tablet (40 mg total) by mouth daily.   cephALEXin 500 MG capsule Commonly known as:  KEFLEX Take 1 capsule (500 mg total) by mouth every 12 (twelve) hours. Stop after 07/26/18   cyanocobalamin 2000 MCG tablet Take 1 tablet (2,000 mcg total) by mouth daily.   glucose blood test strip Commonly known as:  ONE TOUCH ULTRA TEST USE TO CHECK BLOOD SUGAR 3 TIMES DAILY   Glycopyrrolate 25 MCG/ML Soln Commonly known as:  LONHALA MAGNAIR REFILL KIT Inhale 1 puff into the lungs 2 (two) times daily. What changed:  Another medication with the same name was removed. Continue taking this medication, and follow the directions you see here.   Insulin Pen Needle 32G X 6 MM Misc Commonly known as:  NOVOFINE 1 Act by Does not apply route daily.   JANUVIA 100 MG tablet Generic drug:  sitaGLIPtin TAKE 1 TABLET (100 MG TOTAL) BY MOUTH DAILY.   LORazepam 1 MG tablet Commonly known as:  ATIVAN Take 0.5 tablets (0.5 mg total) by mouth every 8 (eight) hours as needed for anxiety.   pantoprazole 40 MG tablet Commonly known as:  PROTONIX Take 1 tablet (40 mg total) by mouth daily. What changed:    when to take this  reasons to take this   repaglinide 2 MG tablet Commonly known as:  PRANDIN TAKE 1 TABLET (2 MG TOTAL) BY MOUTH 3 (THREE) TIMES DAILY BEFORE MEALS.   SUMAtriptan 50 MG tablet Commonly known as:  IMITREX TAKE 1 TABLET BY MOUTH AS NEEDED FOR MIGRAINE, CAN REPEAT IN 2 HOURS  IF HEADACHE PERSISTS What changed:  See the new instructions.   tizanidine 2 MG capsule Commonly known as:  ZANAFLEX Take 1 capsule (2 mg total) by mouth 3 (three) times daily as needed. What changed:  See the new instructions.   traZODone 50 MG tablet Commonly known as:  DESYREL Take 100 mg by mouth at bedtime.   VIIBRYD 40 MG Tabs Generic drug:  Vilazodone HCl Take 40 mg by mouth daily.      Contact information for after-discharge care    Destination    [facility] .   Service:  Skilled Nursing Contact information: [place] [address] [phone]             Allergies  Allergen Reactions  . Metformin And Related Diarrhea    (takes metformin at home w/ meals)  . Ace Inhibitors Other (See Comments)    unknown  . Erythromycin Hives  . Penicillins Hives    Has patient had a PCN reaction causing immediate rash, facial/tongue/throat swelling, SOB or lightheadedness with hypotension: Yes Has patient had a PCN reaction causing severe rash involving  mucus membranes or skin necrosis: No Has patient had a PCN reaction that required hospitalization: No Has patient had a PCN reaction occurring within the last 10 years: No If all of the above answers are "NO", then may proceed with Cephalosporin use.   . Topamax [Topiramate] Rash    Consultations:  PCCM   Procedures/Studies: Dg Chest 2 View  Result Date: 07/12/2018 CLINICAL DATA:  Patient fell yesterday and has had chest pain since that time. EXAM: CHEST - 2 VIEW COMPARISON:  PA and lateral chest x-ray of June 27, 2018. FINDINGS: The lungs are mildly hyperinflated. There is no pleural effusion, pneumothorax, or pneumomediastinum. The heart and pulmonary vascularity are normal. The mediastinum is normal in width. There is calcification in the wall of the aortic arch. There is mild multilevel degenerative disc disease of the thoracic spine. IMPRESSION: Chronic bronchitic  changes, stable. No acute cardiopulmonary abnormality. No evidence of acute post traumatic injury. Electronically Signed   By: David  Martinique M.D.   On: 07/12/2018 07:53   Dg Chest 2 View  Result Date: 06/27/2018 CLINICAL DATA:  Cough over the last 2 weeks EXAM: CHEST - 2 VIEW COMPARISON:  Portable chest x-ray of 11/28/2016 FINDINGS: No active infiltrate or effusion is seen. There is mild peribronchial thickening which can be seen with bronchitis. Mediastinal and hilar contours are unremarkable and the heart is within upper limits normal. No acute bony abnormality is seen. IMPRESSION: No pneumonia or effusion. Peribronchial thickening may indicate bronchitis. Electronically Signed   By: Ivar Drape M.D.   On: 06/27/2018 11:50   Ct Head Wo Contrast  Result Date: 06/27/2018 CLINICAL DATA:  Recent falls with headaches, initial encounter EXAM: CT HEAD WITHOUT CONTRAST TECHNIQUE: Contiguous axial images were obtained from the base of the skull through the vertex without intravenous contrast. COMPARISON:  07/23/2014 FINDINGS: Brain: Right ventricular shunt catheter is again identified. The ventricles are slightly smaller in size when compared with the prior exam. Stable mild prominence of the CSF space along the convexities is noted. Mild chronic white matter ischemic change is seen. No findings to suggest acute hemorrhage, acute infarction or space-occupying mass lesion are noted. Vascular: No hyperdense vessel or unexpected calcification. Skull: Normal. Negative for fracture or focal lesion. Sinuses/Orbits: No acute finding. Other: None. IMPRESSION: Stable right ventricular shunt catheter. Ventricles are slightly smaller than that seen on the prior exam. No new focal abnormality is noted. Electronically Signed   By: Inez Catalina M.D.   On: 06/27/2018 14:01   Dg Chest Port 1 View  Result Date: 07/22/2018 CLINICAL DATA:  Respiratory failure. EXAM: PORTABLE CHEST 1 VIEW COMPARISON:  07/20/2018 FINDINGS:  Patient slightly rotated to the left. Right IJ central venous catheter unchanged. Lungs are adequately inflated and demonstrate mild prominence of the central pulmonary vasculature likely mild vascular congestion. No focal airspace consolidation or effusion. Mild stable cardiomegaly. Remainder of the exam is unchanged. IMPRESSION: Mild stable cardiomegaly with evidence of mild vascular congestion. Right IJ central venous catheter unchanged. Electronically Signed   By: Marin Olp M.D.   On: 07/22/2018 08:50   Dg Chest Portable 1 View  Result Date: 07/20/2018 CLINICAL DATA:  Shortness of breath EXAM: PORTABLE CHEST 1 VIEW COMPARISON:  July 11, 2018 FINDINGS: The distal tip of a right central line is not well visualized but terminates at least at the level of the SVC. Mild increased interstitial markings in the lungs. No pneumothorax. No focal infiltrate. No other change. IMPRESSION: 1. A new right central line  is difficult to see distally but likely terminates in the SVC. No pneumothorax. 2. Findings suggest pulmonary venous congestion/mild edema. Electronically Signed   By: Dorise Bullion III M.D   On: 07/20/2018 14:59   Ct Head Code Stroke Wo Contrast  Result Date: 07/20/2018 CLINICAL DATA:  Code stroke. 72 y/o F; unexplained altered level of consciousness. EXAM: CT HEAD WITHOUT CONTRAST TECHNIQUE: Contiguous axial images were obtained from the base of the skull through the vertex without intravenous contrast. COMPARISON:  06/27/2018 CT head FINDINGS: Brain: No evidence of acute infarction, hemorrhage, hydrocephalus, extra-axial collection or mass lesion/mass effect. Right parietal approach ventriculostomy catheter with tip in the right lateral ventricle adjacent to the foramen of Monro. Stable ventricle size. 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 Stable chronic microvascular ischemic changes and volume loss of the brain. Vascular: Calcific atherosclerosis of carotid  siphons. No hyperdense vessel. Skull: Chronic postsurgical changes related to a right parietal burr hole. No acute abnormality. Sinuses/Orbits: No acute finding. Other: Bilateral intra-ocular lens replacement. ASPECTS [facility] Stroke Program Early CT Score) - Ganglionic level infarction (caudate, lentiform nuclei, internal capsule, insula, M1-M3 cortex): 7 - Supraganglionic infarction (M4-M6 cortex): 3 Total score (0-10 with 10 being normal): 10 IMPRESSION: 1. No acute intracranial abnormality identified. 2. ASPECTS is 10. 3. Stable chronic microvascular ischemic changes and volume loss of the brain. 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 4. Stable position of right parietal approach ventriculostomy catheter. Stable ventricle size. These results were called by telephone at the time of interpretation on 07/20/2018 at 1:51 pm to Dr. Duffy Bruce , who verbally acknowledged these results. Electronically Signed   By: Kristine Garbe M.D.   On: 07/20/2018 13:53      Subjective:  Patient seen and examined at bedside this morning.  Remains comfortable.  Hemodynamically stable. Discharge Exam: Vitals:   07/25/18 0405 07/25/18 0807  BP: (!) 157/90 136/79  Pulse: 96 98  Resp:    Temp: 98.2 F (36.8 C) 98.1 F (36.7 C)  SpO2: 94% 97%   Vitals:   07/25/18 0016 07/25/18 0105 07/25/18 0405 07/25/18 0807  BP: (!) 165/90 (!) 147/81 (!) 157/90 136/79  Pulse: 91  96 98  Resp:      Temp: 98.3 F (36.8 C)  98.2 F (36.8 C) 98.1 F (36.7 C)  TempSrc: Oral  Oral Oral  SpO2: 91%  94% 97%  Weight:   95.1 kg   Height:        General: Pt is alert, awake, not in acute distress,anxious Cardiovascular: RRR, S1/S2 +, no rubs, no gallops Respiratory: CTA bilaterally, no wheezing, no rhonchi Abdominal: Soft, NT, ND, bowel sounds + Extremities: no edema, no cyanosis    The results of significant diagnostics from this hospitalization (including imaging, microbiology, ancillary and  laboratory) are listed below for reference.     Microbiology: Recent Results (from the past 240 hour(s))  MRSA PCR Screening     Status: None   Collection Time: 07/20/18  5:06 PM  Result Value Ref Range Status   MRSA by PCR NEGATIVE NEGATIVE Final    Comment:        The GeneXpert MRSA Assay (FDA approved for NASAL specimens only), is one component of a comprehensive MRSA colonization surveillance program. It is not intended to diagnose MRSA infection nor to guide or monitor treatment for MRSA infections. Performed at [facility], [place] [address], [place], [place] [address]   Culture, Urine     Status: Abnormal  Collection Time: 07/21/18  4:31 PM  Result Value Ref Range Status   Specimen Description URINE, CLEAN CATCH  Final   Special Requests   Final    NONE Normal Performed at [facility], [place] [address] [place],  [address]    Culture >=100,000 COLONIES/mL KLEBSIELLA PNEUMONIAE (A)  Final   Report Status 07/23/2018 FINAL  Final   Organism ID, Bacteria KLEBSIELLA PNEUMONIAE (A)  Final      Susceptibility   Klebsiella pneumoniae - MIC*    AMPICILLIN RESISTANT Resistant     CEFAZOLIN <=4 SENSITIVE Sensitive     CEFTRIAXONE <=1 SENSITIVE Sensitive     CIPROFLOXACIN <=0.25 SENSITIVE Sensitive     GENTAMICIN <=1 SENSITIVE Sensitive     IMIPENEM <=0.25 SENSITIVE Sensitive     NITROFURANTOIN 32 SENSITIVE Sensitive     TRIMETH/SULFA <=20 SENSITIVE Sensitive     AMPICILLIN/SULBACTAM <=2 SENSITIVE Sensitive     PIP/TAZO <=4 SENSITIVE Sensitive     Extended ESBL NEGATIVE Sensitive     * >=100,000 COLONIES/mL KLEBSIELLA PNEUMONIAE     Labs: BNP (last 3 results) No results for input(s): BNP in the last 8760 hours. Basic Metabolic Panel: Recent Labs  Lab 07/20/18 1333 07/20/18 1336 07/20/18 1842 07/21/18 0305 07/22/18 0356  NA 135 135  --  141 140  K 3.6 3.6  --  3.4* 4.0  CL 99 101  --  108 104  CO2 23  --   --  21* 27  GLUCOSE 205* 200*  --   132* 215*  BUN 36* 34*  --  23 11  CREATININE 2.44* 2.50* 1.86* 1.43* 1.09*  CALCIUM 8.7*  --   --  8.6* 9.2  MG  --   --   --  2.1  --    Liver Function Tests: Recent Labs  Lab 07/20/18 1333 07/22/18 0356  AST 23 18  ALT 13 13  ALKPHOS 49 51  BILITOT 0.9 1.0  PROT 6.4* 6.9  ALBUMIN 3.5 3.4*   No results for input(s): LIPASE, AMYLASE in the last 168 hours. No results for input(s): AMMONIA in the last 168 hours. CBC: Recent Labs  Lab 07/20/18 1333 07/20/18 1336 07/20/18 1842 07/21/18 0305 07/22/18 0356  WBC 7.9  --  7.0 8.3 6.1  NEUTROABS 5.0  --   --   --   --   HGB 13.2 13.3 12.9 13.6 13.7  HCT 41.1 39.0 39.2 39.5 40.5  MCV 96.3  --  94.7 94.3 94.2  PLT 211  --  206 209 213   Cardiac Enzymes: Recent Labs  Lab 07/20/18 1352 07/20/18 2049  TROPONINI 0.03* <0.03   BNP: Invalid input(s): POCBNP CBG: Recent Labs  Lab 07/24/18 0741 07/24/18 1142 07/24/18 1647 07/24/18 2106 07/25/18 0804  GLUCAP 179* 166* 152* 211* 173*   D-Dimer No results for input(s): DDIMER in the last 72 hours. Hgb A1c No results for input(s): HGBA1C in the last 72 hours. Lipid Profile No results for input(s): CHOL, HDL, LDLCALC, TRIG, CHOLHDL, LDLDIRECT in the last 72 hours. Thyroid function studies No results for input(s): TSH, T4TOTAL, T3FREE, THYROIDAB in the last 72 hours.  Invalid input(s): FREET3 Anemia work up No results for input(s): VITAMINB12, FOLATE, FERRITIN, TIBC, IRON, RETICCTPCT in the last 72 hours. Urinalysis    Component Value Date/Time   COLORURINE YELLOW 07/20/2018 0216   APPEARANCEUR CLOUDY (A) 07/20/2018 0216   LABSPEC 1.015 07/20/2018 0216   PHURINE 6.0 07/20/2018 0216   GLUCOSEU NEGATIVE 07/20/2018  0216   GLUCOSEU NEGATIVE 06/07/2018 1509   HGBUR NEGATIVE 07/20/2018 0216   BILIRUBINUR SMALL (A) 07/20/2018 0216   BILIRUBINUR negative 01/17/2018 1352   KETONESUR NEGATIVE 07/20/2018 0216   PROTEINUR NEGATIVE 07/20/2018 0216   UROBILINOGEN 0.2  06/07/2018 1509   NITRITE NEGATIVE 07/20/2018 0216   LEUKOCYTESUR LARGE (A) 07/20/2018 0216   Sepsis Labs Invalid input(s): PROCALCITONIN,  WBC,  LACTICIDVEN Microbiology Recent Results (from the past 240 hour(s))  MRSA PCR Screening     Status: None   Collection Time: 07/20/18  5:06 PM  Result Value Ref Range Status   MRSA by PCR NEGATIVE NEGATIVE Final    Comment:        The GeneXpert MRSA Assay (FDA approved for NASAL specimens only), is one component of a comprehensive MRSA colonization surveillance program. It is not intended to diagnose MRSA infection nor to guide or monitor treatment for MRSA infections. Performed at [facility], [place] [address], [place], [place] [address]   Culture, Urine     Status: Abnormal   Collection Time: 07/21/18  4:31 PM  Result Value Ref Range Status   Specimen Description URINE, CLEAN CATCH  Final   Special Requests   Final    NONE Normal Performed at [facility], [place] [address] [place], [place] [address]    Culture >=100,000 COLONIES/mL KLEBSIELLA PNEUMONIAE (A)  Final   Report Status 07/23/2018 FINAL  Final   Organism ID, Bacteria KLEBSIELLA PNEUMONIAE (A)  Final      Susceptibility   Klebsiella pneumoniae - MIC*    AMPICILLIN RESISTANT Resistant     CEFAZOLIN <=4 SENSITIVE Sensitive     CEFTRIAXONE <=1 SENSITIVE Sensitive     CIPROFLOXACIN <=0.25 SENSITIVE Sensitive     GENTAMICIN <=1 SENSITIVE Sensitive     IMIPENEM <=0.25 SENSITIVE Sensitive     NITROFURANTOIN 32 SENSITIVE Sensitive     TRIMETH/SULFA <=20 SENSITIVE Sensitive     AMPICILLIN/SULBACTAM <=2 SENSITIVE Sensitive     PIP/TAZO <=4 SENSITIVE Sensitive     Extended ESBL NEGATIVE Sensitive     * >=100,000 COLONIES/mL KLEBSIELLA PNEUMONIAE    Please note: You were cared for by a hospitalist during your hospital stay. Once you are discharged, your primary care physician will handle any further medical issues. Please note that NO REFILLS for any discharge  medications will be authorized once you are discharged, as it is imperative that you return to your primary care physician (or establish a relationship with a primary care physician if you do not have one) for your post hospital discharge needs so that they can reassess your need for medications and monitor your lab values.    Time coordinating discharge: 40 minutes  SIGNED:   Shelly Coss, MD  [facility] 07/25/2018, 11:23 AM Pager [phone]  If 7PM-7AM, please contact night-coverage [email or web address] Password [email or web address]

## 2018-07-25 NOTE — Progress Notes (Signed)
Patient will DC [id number] Anticipated DC date: 07/25/18 Family notified: Collie Siad Transport by: Corey Harold  Per MD patient ready for DC to [facility] . RN, patient, patient's family, and facility notified of DC. Discharge Summary sent to facility. RN given number for report [phone]. DC packet on chart. Ambulance transport requested for patient.  CSW signing off.  [place], [place]

## 2018-07-25 NOTE — Progress Notes (Signed)
Report called to Freddi Starr RN. Pt Vital Signs stable. Away transport to facility

## 2018-07-25 NOTE — Clinical Social Work Placement (Signed)
   CLINICAL SOCIAL WORK PLACEMENT  NOTE  Date:  07/25/2018  Patient Details  Name: [name] MRN: [id number] Date of Birth: [birth year]  Clinical Social Work is seeking post-discharge placement for this patient at the [place] level of care (*CSW will initial, date and re-position this form in  chart as items are completed):  Yes   Patient/family provided with [facility] Work Department's list of facilities offering this level of care within the geographic area requested by the patient (or if unable, by the patient's family).      Patient/family informed of their freedom to choose among providers that offer the needed level of care, that participate in Medicare, Medicaid or managed care program needed by the patient, have an available bed and are willing to accept the patient.      Patient/family informed of [facility]'s ownership interest in [facility] and [facility], as well as of the fact that they are under no obligation to receive care at these facilities.  PASRR submitted to EDS on       PASRR number received on 07/23/18     Existing PASRR number confirmed on       FL2 transmitted to all facilities in geographic area requested by pt/family on 07/23/18     FL2 transmitted to all facilities within larger geographic area on       Patient informed that his/her managed care company has contracts with or will negotiate with certain facilities, including the following:        Yes   Patient/family informed of bed offers received.  Patient chooses bed at [facility]     Physician recommends and patient chooses bed at      Patient to be transferred to [facility] on 07/25/18.  Patient to be transferred to facility by [facility]     Patient family notified on 07/25/18 of transfer.  Name of family member notified:  Collie Siad     PHYSICIAN       Additional Comment:    _______________________________________________ Alberteen Sam,  LCSW 07/25/2018, 1:54 PM

## 2018-07-25 NOTE — Progress Notes (Signed)
Physical Therapy Treatment Patient Details Name: [name] MRN: [id number] DOB: [birth year] Today's Date: 07/25/2018    History of Present Illness Pt is a 72 y.o. female admitted 07/20/18 after being found by friend, lethargic with slurred speech, code stroke activated; head CT showed no acute abnormality. AMS deemed due to hypotension from medications. PMH includes HTN, hydrocephalus, brain sx, falls, depression, anxiety.    PT Comments    Pt admitted with above diagnosis. Pt currently with functional limitations due to the deficits listed below (see PT Problem List). Pt was able to ambulate with RW with min assist and mod cues overall due to poor safety awareness. Will need SNF.  Pt will benefit from skilled PT to increase their independence and safety with mobility to allow discharge to the venue listed below.     Follow Up Recommendations  SNF;Supervision for mobility/OOB     Equipment Recommendations  None recommended by PT    Recommendations for Other Services       Precautions / Restrictions Precautions Precautions: Fall Restrictions Weight Bearing Restrictions: No    Mobility  Bed Mobility Overal bed mobility: Modified Independent                Transfers Overall transfer level: Needs assistance Equipment used: Rolling walker (2 wheeled) Transfers: Sit to/from Stand Sit to Stand: Supervision         General transfer comment: Min guard for safety/balance. Poor safety awareness, requiring frequent cues to keep RW close and to use safely  Ambulation/Gait Ambulation/Gait assistance: Min guard Gait Distance (Feet): 150 Feet Assistive device: Rolling walker (2 wheeled) Gait Pattern/deviations: Step-through pattern;Decreased stride length;Trunk flexed;Wide base of support;Drifts right/left Gait velocity: Decreased Gait velocity interpretation: 1.31 - 2.62 ft/sec, indicative of limited community ambulator General Gait Details: Ambulation with RW and  min guard for balance; poor safety awareness and decreased attention placing pt at significant risk for falls. Frequent cues for safe use of RW   Stairs             Wheelchair Mobility    Modified Rankin (Stroke Patients Only)       Balance Overall balance assessment: Needs assistance Sitting-balance support: Feet supported;No upper extremity supported Sitting balance-Leahy Scale: Fair     Standing balance support: Bilateral upper extremity supported Standing balance-Leahy Scale: Poor Standing balance comment: Can static stand without UE support and supervision but needs min to min guard with RW due to poor saftety.                             Cognition Arousal/Alertness: Awake/alert Behavior During Therapy: WFL for tasks assessed/performed Overall Cognitive Status: No family/caregiver present to determine baseline cognitive functioning Area of Impairment: Attention;Safety/judgement;Awareness;Following commands                   Current Attention Level: Selective   Following Commands: Follows one step commands inconsistently Safety/Judgement: Decreased awareness of safety;Decreased awareness of deficits Awareness: Emergent   General Comments: Hard of hearing requiring increased time for processing commands.      Exercises      General Comments        Pertinent Vitals/Pain Pain Assessment: No/denies pain    Home Living                      Prior Function            PT Goals (current goals can now  be found in the care plan section) Acute Rehab PT Goals Patient Stated Goal: Return home Progress towards PT goals: Progressing toward goals    Frequency    Min 2X/week      PT Plan Current plan remains appropriate    Co-evaluation              AM-PAC PT "6 Clicks" Mobility   Outcome Measure  Help needed turning from your back to your side while in a flat bed without using bedrails?: None Help needed moving from  lying on your back to sitting on the side of a flat bed without using bedrails?: None Help needed moving to and from a bed to a chair (including a wheelchair)?: A Little Help needed standing up from a chair using your arms (e.g., wheelchair or bedside chair)?: A Little Help needed to walk in hospital room?: A Lot Help needed climbing 3-5 steps with a railing? : A Lot 6 Click Score: 18    End of Session Equipment Utilized During Treatment: Gait belt Activity Tolerance: Patient tolerated treatment well Patient left: with call bell/phone within reach;in bed;with bed alarm set Nurse Communication: Mobility status PT Visit Diagnosis: Other abnormalities of gait and mobility (R26.89);Repeated falls (R29.6)     Time: 3403-7096 PT Time Calculation (min) (ACUTE ONLY): 11 min  Charges:  $Gait Training: 8-22 mins                     [place] Pager:  [phone]  Office:  [place] 07/25/2018, 10:23 AM

## 2018-07-28 ENCOUNTER — Telehealth: Payer: Self-pay | Admitting: Internal Medicine

## 2018-07-28 ENCOUNTER — Telehealth: Payer: Self-pay | Admitting: *Deleted

## 2018-07-28 NOTE — Telephone Encounter (Signed)
I spoke with Mrs. [name] today in regards to patient.  Mrs. [name] would like to know if Dr. Ronnald Ramp has been updated on patients health?  States patient has been in the hospital and is now at [facility] for rehab.  States she sent Dr. Ronnald Ramp correspondence a couple weeks ago in reference to patients ability to stay at home by herself.  States she will be in touch on Monday of next week after following up with the Education officer, museum at [employer].

## 2018-07-28 NOTE — Telephone Encounter (Signed)
Patient discharged to skill nursing facility not eligible for TCM

## 2018-07-31 NOTE — Telephone Encounter (Signed)
Routing to dr jones, fyi.... 

## 2018-08-03 ENCOUNTER — Ambulatory Visit: Payer: Medicare Other | Admitting: Internal Medicine

## 2018-08-06 ENCOUNTER — Other Ambulatory Visit: Payer: Self-pay | Admitting: Internal Medicine

## 2018-08-06 DIAGNOSIS — M5412 Radiculopathy, cervical region: Secondary | ICD-10-CM

## 2018-08-18 ENCOUNTER — Ambulatory Visit: Payer: Medicare Other

## 2018-08-18 ENCOUNTER — Encounter: Payer: Self-pay | Admitting: Podiatry

## 2018-08-18 ENCOUNTER — Ambulatory Visit: Payer: Medicare Other | Admitting: Podiatry

## 2018-08-18 DIAGNOSIS — M79671 Pain in right foot: Secondary | ICD-10-CM

## 2018-08-18 DIAGNOSIS — Q828 Other specified congenital malformations of skin: Secondary | ICD-10-CM

## 2018-08-18 DIAGNOSIS — M79675 Pain in left toe(s): Secondary | ICD-10-CM

## 2018-08-18 DIAGNOSIS — M79674 Pain in right toe(s): Secondary | ICD-10-CM | POA: Diagnosis not present

## 2018-08-18 DIAGNOSIS — B351 Tinea unguium: Secondary | ICD-10-CM

## 2018-08-18 DIAGNOSIS — M7752 Other enthesopathy of left foot: Secondary | ICD-10-CM

## 2018-08-18 DIAGNOSIS — M779 Enthesopathy, unspecified: Secondary | ICD-10-CM

## 2018-08-18 DIAGNOSIS — M79672 Pain in left foot: Secondary | ICD-10-CM

## 2018-08-18 MED ORDER — TRIAMCINOLONE ACETONIDE 10 MG/ML IJ SUSP
10.0000 mg | Freq: Once | INTRAMUSCULAR | Status: AC
  Administered 2018-08-18: 10 mg

## 2018-08-18 NOTE — Progress Notes (Signed)
Subjective:   Patient ID: [name], female   DOB: 73 y.o.   MRN: [id number]   HPI Patient presents with painful nail disease 1-5 both feet that she cannot cut along with inflammation of the fifth MPJ right that is painful and pain with lesion formation fifth digit metatarsal right foot.  Presents with caregiver   ROS      Objective:  Physical Exam  Neurovascular status intact with patient found to have incurvated nailbeds that are thick 1-5 both feet and is noted to have fluid buildup around the fifth MPJ right with keratotic lesion     Assessment:  Capsulitis fifth MPJ right with fluid buildup and lesion and nail disease with incurvation 1-5 both feet     Plan:  Mycotic nail infection with pain 1-5 both feet with lesion and inflammatory capsulitis fifth MPJ right today I went ahead and I did do a careful injection fifth MPJ 3 mg dexamethasone Kenalog and debrided the lesion with sterile instrumentation no iatrogenic bleeding and debrided nailbeds 1-5 both feet with no iatrogenic

## 2018-08-21 ENCOUNTER — Ambulatory Visit: Payer: Medicare Other | Admitting: Podiatry

## 2018-08-31 ENCOUNTER — Ambulatory Visit: Payer: Self-pay | Admitting: Internal Medicine

## 2018-09-16 ENCOUNTER — Emergency Department (HOSPITAL_COMMUNITY): Payer: Medicare Other

## 2018-09-16 ENCOUNTER — Emergency Department (HOSPITAL_COMMUNITY)
Admission: EM | Admit: 2018-09-16 | Discharge: 2018-09-17 | Disposition: A | Discharge status: Other Acute Inpatient Hospital | Payer: Medicare Other | Attending: Emergency Medicine | Admitting: Emergency Medicine

## 2018-09-16 ENCOUNTER — Other Ambulatory Visit: Payer: Self-pay

## 2018-09-16 ENCOUNTER — Encounter (HOSPITAL_COMMUNITY): Payer: Self-pay

## 2018-09-16 DIAGNOSIS — F419 Anxiety disorder, unspecified: Secondary | ICD-10-CM | POA: Insufficient documentation

## 2018-09-16 DIAGNOSIS — R45851 Suicidal ideations: Secondary | ICD-10-CM | POA: Diagnosis not present

## 2018-09-16 DIAGNOSIS — E119 Type 2 diabetes mellitus without complications: Secondary | ICD-10-CM | POA: Insufficient documentation

## 2018-09-16 DIAGNOSIS — E118 Type 2 diabetes mellitus with unspecified complications: Secondary | ICD-10-CM

## 2018-09-16 DIAGNOSIS — Z008 Encounter for other general examination: Secondary | ICD-10-CM

## 2018-09-16 DIAGNOSIS — Z79899 Other long term (current) drug therapy: Secondary | ICD-10-CM | POA: Diagnosis not present

## 2018-09-16 DIAGNOSIS — I10 Essential (primary) hypertension: Secondary | ICD-10-CM | POA: Diagnosis not present

## 2018-09-16 DIAGNOSIS — Z794 Long term (current) use of insulin: Secondary | ICD-10-CM | POA: Insufficient documentation

## 2018-09-16 DIAGNOSIS — Z9049 Acquired absence of other specified parts of digestive tract: Secondary | ICD-10-CM | POA: Diagnosis not present

## 2018-09-16 DIAGNOSIS — F333 Major depressive disorder, recurrent, severe with psychotic symptoms: Secondary | ICD-10-CM

## 2018-09-16 DIAGNOSIS — F332 Major depressive disorder, recurrent severe without psychotic features: Secondary | ICD-10-CM | POA: Diagnosis not present

## 2018-09-16 DIAGNOSIS — F99 Mental disorder, not otherwise specified: Secondary | ICD-10-CM | POA: Diagnosis present

## 2018-09-16 DIAGNOSIS — J410 Simple chronic bronchitis: Secondary | ICD-10-CM

## 2018-09-16 LAB — BASIC METABOLIC PANEL
Anion gap: 9 (ref 5–15)
BUN: 15 mg/dL (ref 8–23)
CO2: 27 mmol/L (ref 22–32)
Calcium: 9.4 mg/dL (ref 8.9–10.3)
Chloride: 103 mmol/L (ref 98–111)
Creatinine, Ser: 0.78 mg/dL (ref 0.44–1.00)
GFR calc Af Amer: >60 mL/min (ref >60)
GFR calc non Af Amer: >60 mL/min (ref >60)
Glucose, Bld: 111 mg/dL — ABNORMAL HIGH (ref 70–99)
Potassium: 3.6 mmol/L (ref 3.5–5.1)
Sodium: 139 mmol/L (ref 135–145)

## 2018-09-16 LAB — URINALYSIS, ROUTINE W REFLEX MICROSCOPIC
Bilirubin Urine: NEGATIVE
Glucose, UA: NEGATIVE mg/dL
Hgb urine dipstick: NEGATIVE
Ketones, ur: NEGATIVE mg/dL
Leukocytes,Ua: NEGATIVE
Nitrite: NEGATIVE
Protein, ur: NEGATIVE mg/dL
Specific Gravity, Urine: 1.014 (ref 1.005–1.030)
pH: 7 (ref 5.0–8.0)

## 2018-09-16 LAB — CBC WITH DIFFERENTIAL/PLATELET
Abs Immature Granulocytes: 0.02 10*3/uL (ref 0.00–0.07)
Basophils Absolute: 0 10*3/uL (ref 0.0–0.1)
Basophils Relative: 1 %
Eosinophils Absolute: 0.2 10*3/uL (ref 0.0–0.5)
Eosinophils Relative: 3 %
HCT: 45.3 % (ref 36.0–46.0)
Hemoglobin: 14.9 g/dL (ref 12.0–15.0)
Immature Granulocytes: 0 %
Lymphocytes Relative: 19 %
Lymphs Abs: 1.1 10*3/uL (ref 0.7–4.0)
MCH: 32.1 pg (ref 26.0–34.0)
MCHC: 32.9 g/dL (ref 30.0–36.0)
MCV: 97.6 fL (ref 80.0–100.0)
Monocytes Absolute: 0.4 10*3/uL (ref 0.1–1.0)
Monocytes Relative: 6 %
Neutro Abs: 4.4 10*3/uL (ref 1.7–7.7)
Neutrophils Relative %: 71 %
Platelets: 194 10*3/uL (ref 150–400)
RBC: 4.64 MIL/uL (ref 3.87–5.11)
RDW: 12 % (ref 11.5–15.5)
WBC: 6.1 10*3/uL (ref 4.0–10.5)
nRBC: 0 % (ref 0.0–0.2)

## 2018-09-16 LAB — CBG MONITORING, ED
Glucose-Capillary: 101 mg/dL — ABNORMAL HIGH (ref 70–99)
Glucose-Capillary: 91 mg/dL (ref 70–99)
Glucose-Capillary: 117 mg/dL — ABNORMAL HIGH (ref 70–99)

## 2018-09-16 LAB — RAPID URINE DRUG SCREEN, HOSP PERFORMED
Amphetamines: NOT DETECTED
Barbiturates: NOT DETECTED
Benzodiazepines: NOT DETECTED
Cocaine: NOT DETECTED
Opiates: NOT DETECTED
Tetrahydrocannabinol: NOT DETECTED

## 2018-09-16 LAB — ETHANOL: Alcohol, Ethyl (B): <10 mg/dL (ref <10)

## 2018-09-16 MED ORDER — REPAGLINIDE 2 MG PO TABS
2.0000 mg | ORAL_TABLET | Freq: Three times a day (TID) | ORAL | Status: DC
  Administered 2018-09-16 – 2018-09-17 (×3): 2 mg via ORAL
  Filled 2018-09-16 (×5): qty 1

## 2018-09-16 MED ORDER — VILAZODONE HCL 40 MG PO TABS: 40.0000 mg | ORAL_TABLET | Freq: Every day | ORAL | Status: DC

## 2018-09-16 MED ORDER — ATORVASTATIN CALCIUM 40 MG PO TABS
40.0000 mg | ORAL_TABLET | Freq: Every day | ORAL | Status: DC
  Administered 2018-09-16: 40 mg via ORAL
  Filled 2018-09-16: qty 1

## 2018-09-16 MED ORDER — INSULIN ASPART 100 UNIT/ML ~~LOC~~ SOLN
0.0000 [IU] | Freq: Three times a day (TID) | SUBCUTANEOUS | Status: DC
  Administered 2018-09-17: 2 [IU] via SUBCUTANEOUS
  Filled 2018-09-16: qty 1

## 2018-09-16 MED ORDER — LORAZEPAM 0.5 MG PO TABS
0.5000 mg | ORAL_TABLET | Freq: Once | ORAL | Status: AC
  Administered 2018-09-16: 0.5 mg via ORAL
  Filled 2018-09-16: qty 1

## 2018-09-16 MED ORDER — AMLODIPINE BESYLATE 5 MG PO TABS
5.0000 mg | ORAL_TABLET | Freq: Every day | ORAL | Status: DC
  Administered 2018-09-17: 5 mg via ORAL
  Filled 2018-09-16: qty 1

## 2018-09-16 MED ORDER — VILAZODONE HCL 20 MG PO TABS
40.0000 mg | ORAL_TABLET | Freq: Every day | ORAL | Status: DC
  Filled 2018-09-16: qty 2

## 2018-09-16 MED ORDER — ARIPIPRAZOLE 2 MG PO TABS
2.0000 mg | ORAL_TABLET | Freq: Every day | ORAL | Status: DC
  Administered 2018-09-17: 2 mg via ORAL
  Filled 2018-09-16 (×2): qty 1

## 2018-09-16 MED ORDER — TRAZODONE HCL 100 MG PO TABS
100.0000 mg | ORAL_TABLET | Freq: Every day | ORAL | Status: DC
  Administered 2018-09-16: 100 mg via ORAL
  Filled 2018-09-16: qty 1

## 2018-09-16 MED ORDER — LINAGLIPTIN 5 MG PO TABS
5.0000 mg | ORAL_TABLET | Freq: Every day | ORAL | Status: DC
  Administered 2018-09-16 – 2018-09-17 (×2): 5 mg via ORAL
  Filled 2018-09-16 (×2): qty 1

## 2018-09-16 MED ORDER — ACETAMINOPHEN 325 MG PO TABS
650.0000 mg | ORAL_TABLET | Freq: Four times a day (QID) | ORAL | Status: DC | PRN
  Administered 2018-09-16 – 2018-09-17 (×2): 650 mg via ORAL
  Filled 2018-09-16 (×2): qty 2

## 2018-09-16 NOTE — ED Notes (Signed)
Bed: OB49 Expected date:  Expected time:  Means of arrival:  Comments: 73 yo SI

## 2018-09-16 NOTE — ED Provider Notes (Signed)
[facility] DEPT Provider Note   CSN: [id number] Arrival date & time: 09/16/18  1115     History   Chief Complaint Chief Complaint  Patient presents with  . Suicidal  . Anxiety    HPI [name] is a 73 y.o. female.  Patient is a 73 year old female with a history of hypertension, normal pressure hydrocephalus, diabetes, chronic anxiety and depression presenting today with suicidal thoughts.  Patient states that 1 month ago she moved into assisted living because of her inability to take care of herself at home as she was having multiple falls and having difficulty remembering to take her medication.  She states since moving to the assisted living facility she has not been happy.  She is had multiple things promised to her that she has not received.  She has seen the doctor there twice and states 1 week ago her Ativan was discontinued.  She states that she had been taking Ativan for 14 years but that the doctor told her she was on Ativan and trazodone and did not need to be on both.  She has noted that her anxiety has been much more severe in the last week.  She states in the past she has considered suicide and even used to own a gun and would take the bullets out and pretend to put it in her mouth and shoot but never could go through with it.  She states she no longer owns the gun so she admits to stealing a knife and trying to cut her wrists but it did not cut well so she returned it after 4 days.  She says she does not want to live but she does not really have the means to kill herself.  She does admit to her father also committing suicide.  The history is provided by the patient.  Anxiety  This is a chronic problem.    Past Medical History:  Diagnosis Date  . Acid reflux disease   . Acute kidney injury ([place])   . Anxiety   . Depression   . Diabetes mellitus without complication ([place])   . Dizziness   . Falls   . Hydrocephalus ([place])   .  Hypertension   . Hypotension   . MVP (mitral valve prolapse)   . Sinus bradycardia   . Transaminitis     Patient Active Problem List   Diagnosis Date Noted  . Pressure injury of skin 07/22/2018  . Hypotension, iatrogenic 07/20/2018  . Chest pressure 07/11/2018  . Intractable episodic headache 06/27/2018  . Frequent UTI 03/08/2018  . Carpal tunnel syndrome of left wrist 03/07/2018  . Chronic midline low back pain 09/22/2017  . Current severe episode of major depressive disorder with psychotic features ([place]) 08/15/2017  . Simple chronic bronchitis ([place]) 06/28/2017  . Excessive daytime sleepiness 12/21/2016  . Migraine without aura and with status migrainosus, not intractable 03/04/2016  . Left ventricular diastolic dysfunction, NYHA class 2 01/21/2016  . Morbid obesity ([place]) 01/21/2016  . 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 Cervical radiculitis 08/07/2015  . GAD (generalized anxiety disorder) 07/18/2014  . Primary osteoarthritis of both knees 05/23/2014  . Visit for screening mammogram 05/22/2014  . Hyperlipidemia with target LDL less than 100 05/22/2014  . Falls frequently 04/02/2014  . Vitamin D deficiency 12/28/2013  . Estrogen deficiency 12/27/2013  . Routine general medical examination at a health care facility 12/27/2013  . Type II diabetes mellitus with manifestations ([place]) 08/15/2013  . OAB (  overactive bladder) 08/15/2013  . Vitamin B12 deficiency anemia 06/05/2010  . HYDROCEPHALUS, NORMAL PRESSURE 06/03/2010  . Essential hypertension 05/15/2008    Past Surgical History:  Procedure Laterality Date  . BRAIN SURGERY    . BREAST BIOPSY Left   . CHOLECYSTECTOMY    . ERCP N/A 02/28/2014   Procedure: ENDOSCOPIC RETROGRADE CHOLANGIOPANCREATOGRAPHY (ERCP);  Surgeon: Gatha Mayer, MD;  Location[facility] ENDOSCOPY;  Service: Endoscopy;  Laterality: N/A;  talked to tiffany from or /ebp  . ESOPHAGOGASTRODUODENOSCOPY N/A 02/28/2014   Procedure: ESOPHAGOGASTRODUODENOSCOPY  (EGD);  Surgeon: Gatha Mayer, MD;  Location[facility] ENDOSCOPY;  Service: Endoscopy;  Laterality: N/A;  . VENTRICULOPERITONEAL SHUNT     right occipital     OB History   No obstetric history on file.      Home Medications    Prior to Admission medications   Medication Sig Start Date End Date Taking? Authorizing Provider  amLODipine (NORVASC) 5 MG tablet Take 1 tablet (5 mg total) by mouth daily. 07/25/18 07/25/19  Shelly Coss, MD  ARIPiprazole (ABILIFY) 2 MG tablet Take 1 tablet (2 mg total) by mouth daily. 07/25/18   Shelly Coss, MD  atorvastatin (LIPITOR) 40 MG tablet Take 1 tablet (40 mg total) by mouth daily. 07/04/18   Janith Lima, MD  cephALEXin (KEFLEX) 500 MG capsule Take 1 capsule (500 mg total) by mouth every 12 (twelve) hours. Stop after 07/26/18 07/25/18   Shelly Coss, MD  cyanocobalamin 2000 MCG tablet Take 1 tablet (2,000 mcg total) by mouth daily. 08/09/15   Janith Lima, MD  glucose blood (ONE TOUCH ULTRA TEST) test strip USE TO CHECK BLOOD SUGAR 3 TIMES DAILY 04/18/18   Janith Lima, MD  Glycopyrrolate [facility] REFILL KIT) 25 MCG/ML SOLN Inhale 1 puff into the lungs 2 (two) times daily. 06/28/18   Janith Lima, MD  Insulin Pen Needle (NOVOFINE) 32G X 6 MM MISC 1 Act by Does not apply route daily. 10/13/16   Janith Lima, MD  JANUVIA 100 MG tablet TAKE 1 TABLET (100 MG TOTAL) BY MOUTH DAILY. 01/08/18   Janith Lima, MD  LORazepam (ATIVAN) 1 MG tablet Take 0.5 tablets (0.5 mg total) by mouth every 8 (eight) hours as needed for anxiety. 07/25/18   Shelly Coss, MD  pantoprazole (PROTONIX) 40 MG tablet Take 1 tablet (40 mg total) by mouth daily. Patient taking differently: Take 40 mg by mouth daily as needed (reflux).  10/26/17   Janith Lima, MD  repaglinide (PRANDIN) 2 MG tablet TAKE 1 TABLET (2 MG TOTAL) BY MOUTH 3 (THREE) TIMES DAILY BEFORE MEALS. 01/09/18   Renato Shin, MD  SUMAtriptan (IMITREX) 50 MG tablet TAKE 1 TABLET BY MOUTH AS  NEEDED FOR MIGRAINE, CAN REPEAT IN 2 HOURS IF HEADACHE PERSISTS Patient taking differently: Take 50 mg by mouth every 2 (two) hours as needed for migraine.  02/10/18   Janith Lima, MD  tizanidine (ZANAFLEX) 2 MG capsule Take 1 capsule (2 mg total) by mouth 3 (three) times daily as needed. 07/25/18   Shelly Coss, MD  tiZANidine (ZANAFLEX) 2 MG tablet  08/14/18   [provider]  traZODone (DESYREL) 50 MG tablet Take 100 mg by mouth at bedtime.  07/24/15   [provider]  Vilazodone HCl (VIIBRYD) 40 MG TABS Take 40 mg by mouth daily.     [provider]    Family History Family History  Problem Relation Age of Onset  . Heart disease Mother   .  Stroke Mother   . Hypertension Mother   . Alcohol abuse Father   . Diabetes Father   . Cancer Neg Hx     Social History Social History   Tobacco Use  . Smoking status: Never Smoker  . Smokeless tobacco: Never Used  Substance Use Topics  . Alcohol use: No  . Drug use: No     Allergies   Metformin and related; Ace inhibitors; Erythromycin; Penicillins; and Topamax [topiramate]   Review of Systems Review of Systems  All other systems reviewed and are negative.    Physical Exam Updated Vital Signs BP 135/79 (BP Location: Right Arm)   Pulse 85   Temp 98.5 F (36.9 C) (Oral)   Resp 18   Ht _0  (1.651 m)   Wt 94.3 kg   SpO2 96%   BMI 34.61 kg/m   Physical Exam Vitals signs and nursing note reviewed.  Constitutional:      General: She is not in acute distress.    Appearance: She is well-developed.  HENT:     Head: Normocephalic and atraumatic.  Eyes:     Pupils: Pupils are equal, round, and reactive to light.  Cardiovascular:     Rate and Rhythm: Normal rate and regular rhythm.     Heart sounds: Normal heart sounds. No murmur. No friction rub.  Pulmonary:     Effort: Pulmonary effort is normal.     Breath sounds: Normal breath sounds. No wheezing or rales.  Abdominal:     General:  Bowel sounds are normal. There is no distension.     Palpations: Abdomen is soft.     Tenderness: There is no abdominal tenderness. There is no guarding or rebound.  Musculoskeletal: Normal range of motion.        General: No tenderness.     Comments: No edema  Skin:    General: Skin is warm and dry.     Findings: No rash.  Neurological:     Mental Status: She is alert and oriented to person, place, and time.     Cranial Nerves: No cranial nerve deficit.  Psychiatric:        Mood and Affect: Mood is anxious.        Behavior: Behavior normal.        Thought Content: Thought content is not delusional. Thought content includes suicidal ideation.        Cognition and Memory: Cognition normal.      ED Treatments / Results  Labs (all labs ordered are listed, but only abnormal results are displayed) Labs Reviewed  BASIC METABOLIC PANEL - Abnormal; Notable for the following components:      Result Value   Glucose, Bld 111 (*)    All other components within normal limits  CBC WITH DIFFERENTIAL/PLATELET  ETHANOL  RAPID URINE DRUG SCREEN, HOSP PERFORMED  URINALYSIS, ROUTINE W REFLEX MICROSCOPIC    EKG None  ED ECG REPORT   Date: 09/16/2018  Rate: 84  Rhythm: normal sinus rhythm  QRS Axis: normal  Intervals: normal  ST/T Wave abnormalities: normal  Conduction Disutrbances:none  Narrative Interpretation:   Old EKG Reviewed: none available and unchanged  I have personally reviewed the EKG tracing and agree with the computerized printout as noted.   Radiology No results found.  Procedures Procedures (including critical care time)  Medications Ordered in ED Medications  LORazepam (ATIVAN) tablet 0.5 mg (has no administration in time range)     Initial Impression / Assessment and Plan /  ED Course  I have reviewed the triage vital signs and the nursing notes.  Pertinent labs & imaging results that were available during my care of the patient were reviewed by me and  considered in my medical decision making (see chart for details).     Patient presenting today with complaints of feeling suicidal as well as being anxious.  Patient has recently moved to an assisted living facility and has not been happy there.  She also recently had her Ativan discontinued 1 week ago because they felt that her being on trazodone was enough.  Patient states she does feel anxious but would rather just die but she does not have a means to kill herself.  She did steal a knife and attempted to cut herself but it did not cut well so she returned it.  She used to own a gun but does not own a gun any longer.  Patient is cooperative and pleasant on exam.  She does seem anxious and distressed.  Vital signs are within normal limits.  No focal medical concerns at this time.  General labs and EKG are pending.  Patient denies any substance use.  We will have TTS evaluate.  1:07 PM Labs and EKG within normal limits.  Patient is medically cleared. Final Clinical Impressions(s) / ED Diagnoses   Final diagnoses:  None    ED Discharge Orders    None       Blanchie Dessert, MD 09/16/18 1307

## 2018-09-16 NOTE — BH Assessment (Signed)
[place] Assessment Progress Note  Case was staffed with Romilda Garret FNP who recommended a inpatient admission (Geropsychiatry) to assist with stabilization.

## 2018-09-16 NOTE — ED Notes (Signed)
Pt alert x4 calm and cooperative, up reading magazines in her room, no c/o discomfort. I will continue to monitor.

## 2018-09-16 NOTE — ED Triage Notes (Signed)
PT BIB EMS FROM [facility] FOR SUICIDAL THOUGHTS AND ANXIETY. PT STS SHE HAS MOVED TO THIS FACILITY A MONTH AGO, AND THE LIVING SITUATION IS NOT GOING WELL. THE DOCTOR HAS CHANGED HER MEDICINES, AND SHE IS NO LONGER GETTING ATIVAN, WHICH SHE HAS BEEN TAKING X14 YEARS. SINCE BEING OFF OF THE MEDICINE X1 WEEK, SHE STS HER ANXIETY AND SUICIDAL THOUGHTS HAVE INCREASED. PT ADMITS TO STEALING A KNIFE, AND TRYING TO CUT HER WRIST, BUT IT DID NOT CUT WELL, SO SHE RETURNED IT AFTER 4 DAYS. PT STS TEARFUL, "I JUST DON'T WANT TO LIVE ANYMORE, BUT I HAVE NO MEANS TO KILL MYSELF."

## 2018-09-16 NOTE — BH Assessment (Addendum)
Assessment Note  [name] is an 73 y.o. female that presents from [facility] assisted living facility with S/I. Patient is vague in reference to a plan this date although reports she acquired a knife earlier this week from the facility and attempted to cut her wrists. Patient denies any H/I or AVH. Patient has recently relocated from her private residence to [place] one month ago. Patient states she is having problems adjusting and "misses home." Patient has a history of hypertension, chronic anxiety and depression with worsening symptoms since she relocated to include: feeling worthless and isolating. Patient's POA Evern Core [phone] is present at bedside who provides collateral information. Patient states that 1 month ago she moved into assisted living because of her inability to take care of herself at home as she was having multiple falls and difficulty remembering to take her medication. Patient states she was diagnosed with depression over ten years ago and is followed by [facility] who assists with medication management. Patient reports current medication compliance. She states since moving to the assisted living facility she has not been happy and was promised "services she didn't receive." Patient states 1 month ago her Ativan was discontinued by resident provider. She states that she had been taking Ativan for 14 years although resident provider told her she was on Ativan and trazodone and did not need to be on both. She has noted that her anxiety has been much more severe in the last week. She states in the past she has considered suicide and even used to own a gun and would take the bullets out and pretend to put it in her mouth and shoot but never could go through with it. Patient also reports she is currently distressed since she was told she could no longer see her OP provider ([facility]) and care would know be managed by [facility]. She states she no longer owns a gun although admits to  stealing a knife and trying to cut her wrists last week but it did not cut well so she returned it after 4 days. She says she does not want to live but she does not really have the means to kill herself. She does admit to her father also committing suicide. Patient is oriented x 4 and speaks in a low soft voice. Patient is noted to have partial hearing loss and ambulates with a walker. Patient denies any past attempts or gestures at self harm. Patient denies any previous inpatient mental health history. Patient denies any SA history. Case was staffed with Romilda Garret FNP who recommended a inpatient admission (Geropsychiatry) to assist with stabilization.   Diagnosis: MDD recurrent without psychotic features, severe, GAD, adjustment disorder  Past Medical History:  Past Medical History:  Diagnosis Date  . Acid reflux disease   . Acute kidney injury ([place])   . Anxiety   . Depression   . Diabetes mellitus without complication ([place])   . Dizziness   . Falls   . Hydrocephalus ([place])   . Hypertension   . Hypotension   . MVP (mitral valve prolapse)   . Sinus bradycardia   . Transaminitis     Past Surgical History:  Procedure Laterality Date  . BRAIN SURGERY    . BREAST BIOPSY Left   . CHOLECYSTECTOMY    . ERCP N/A 02/28/2014   Procedure: ENDOSCOPIC RETROGRADE CHOLANGIOPANCREATOGRAPHY (ERCP);  Surgeon: Gatha Mayer, MD;  Location: [facility] ENDOSCOPY;  Service: Endoscopy;  Laterality: N/A;  talked to tiffany from or /ebp  .  ESOPHAGOGASTRODUODENOSCOPY N/A 02/28/2014   Procedure: ESOPHAGOGASTRODUODENOSCOPY (EGD);  Surgeon: Gatha Mayer, MD;  Location: [facility] ENDOSCOPY;  Service: Endoscopy;  Laterality: N/A;  . VENTRICULOPERITONEAL SHUNT     right occipital    Family History:  Family History  Problem Relation Age of Onset  . Heart disease Mother   . Stroke Mother   . Hypertension Mother   . Alcohol abuse Father   . Diabetes Father   . Cancer Neg Hx     Social History:  reports that she has never  smoked. She has never used smokeless tobacco. She reports that she does not drink alcohol or use drugs.  Additional Social History:  Alcohol / Drug Use Pain Medications: See MAR Prescriptions: See MAR Over the Counter: See MAR History of alcohol / drug use?: No history of alcohol / drug abuse Longest period of sobriety (when/how long): NA Negative Consequences of Use: (NA) Withdrawal Symptoms: (NA)  CIWA: CIWA-Ar BP: 135/79 Pulse Rate: 85 COWS:    Allergies:  Allergies  Allergen Reactions  . Metformin And Related Diarrhea    (takes metformin at home w/ meals)  . Ampicillin Hives    Did it involve swelling of the face/tongue/throat, SOB, or low BP? No Did it involve sudden or severe rash/hives, skin peeling, or any reaction on the inside of your mouth or nose? Yes Did you need to seek medical attention at a hospital or doctor's office? No When did it last happen?Over 10 years ago If all above answers are "NO", may proceed with cephalosporin use.  . Ace Inhibitors Other (See Comments)    Unknown rxn per pt  . Erythromycin Hives  . Penicillins Hives    Has patient had a PCN reaction causing immediate rash, facial/tongue/throat swelling, SOB or lightheadedness with hypotension: Yes Has patient had a PCN reaction causing severe rash involving mucus membranes or skin necrosis: No Has patient had a PCN reaction that required hospitalization: No Has patient had a PCN reaction occurring within the last 10 years: No If all of the above answers are "NO", then may proceed with Cephalosporin use.   . Topamax [Topiramate] Rash    Home Medications: (Not in a hospital admission)   OB/GYN Status:  No LMP recorded. Patient is postmenopausal.  General Assessment Data Location of Assessment: [facility] ED TTS Assessment: In system Is this a Tele or Face-to-Face Assessment?: Face-to-Face Is this an Initial Assessment or a Re-assessment for this encounter?: Initial Assessment Patient Accompanied  by:: Other(POA present Fredirick Maudlin [phone]) Language Other than English: No Living Arrangements: Other (Comment)([facility] living ) What gender do you identify as?: Female Marital status: Single Maiden name: Collyer Pregnancy Status: No Living Arrangements: Other (Comment)(Assisted living) Can pt return to current living arrangement?: Yes Admission Status: Voluntary Is patient capable of signing voluntary admission?: Yes Referral Source: Self/Family/Friend Insurance type: [place] Screening Exam ([place]) Medical Exam completed: Yes  Crisis Care Plan Living Arrangements: Other (Comment)(Assisted living) Legal Guardian: (NA) Name of Psychiatrist: [facility] Name of Therapist: None  Education Status Is patient currently in school?: No Is the patient employed, unemployed or receiving disability?: Receiving disability income  Risk to self with the past 6 months Suicidal Ideation: Yes-Currently Present Has patient been a risk to self within the past 6 months prior to admission? : Yes Suicidal Intent: Yes-Currently Present Has patient had any suicidal intent within the past 6 months prior to admission? : Yes Is patient at risk for suicide?: Yes  Suicidal Plan?: Yes-Currently Present Has patient had any suicidal plan within the past 6 months prior to admission? : Yes Specify Current Suicidal Plan: Cut wrists Access to Means: No What has been your use of drugs/alcohol within the last 12 months?: Denies Previous Attempts/Gestures: No How many times?: 0 Other Self Harm Risks: (Adjustment disorder relocating) Triggers for Past Attempts: Unknown Intentional Self Injurious Behavior: Cutting Comment - Self Injurious Behavior: Hx of cutting Family Suicide History: Yes Recent stressful life event(s): Other (Comment)(Recently relocated to assist living) Persecutory voices/beliefs?: No Depression: Yes Depression Symptoms: Feeling worthless/self  pity Substance abuse history and/or treatment for substance abuse?: No Suicide prevention information given to non-admitted patients: Not applicable  Risk to Others within the past 6 months Homicidal Ideation: No Does patient have any lifetime risk of violence toward others beyond the six months prior to admission? : No Thoughts of Harm to Others: No Current Homicidal Intent: No-Not Currently/Within Last 6 Months Current Homicidal Plan: No Access to Homicidal Means: No Identified Victim: NA History of harm to others?: No Assessment of Violence: None Noted Violent Behavior Description: NA Does patient have access to weapons?: No Criminal Charges Pending?: No Does patient have a court date: No Is patient on probation?: No  Psychosis Hallucinations: None noted Delusions: None noted  Mental Status Report Appearance/Hygiene: In scrubs Eye Contact: Fair Motor Activity: Freedom of movement Speech: Logical/coherent Level of Consciousness: Alert Mood: Anxious Affect: Appropriate to circumstance Anxiety Level: Moderate Thought Processes: Coherent, Relevant Judgement: Partial Orientation: Person, Place, Time Obsessive Compulsive Thoughts/Behaviors: None  Cognitive Functioning Concentration: Good Memory: Recent Intact, Remote Intact Is patient IDD: No Insight: Fair Impulse Control: Poor Appetite: Fair Have you had any weight changes? : No Change Sleep: No Change Total Hours of Sleep: 6 Vegetative Symptoms: None  ADLScreening [facility] Assessment Services) Patient's cognitive ability adequate to safely complete daily activities?: Yes Patient able to express need for assistance with ADLs?: Yes Independently performs ADLs?: No  Prior Inpatient Therapy Prior Inpatient Therapy: No  Prior Outpatient Therapy Prior Outpatient Therapy: Yes Prior Therapy Dates: Ongoing Prior Therapy Facilty/Provider(s): [facility] Reason for Treatment: Med mang Does patient have an ACCT team?:  No Does patient have Intensive In-House Services?  : No Does patient have [facility] services? : No Does patient have P4CC services?: No  ADL Screening (condition at time of admission) Patient's cognitive ability adequate to safely complete daily activities?: Yes Is the patient deaf or have difficulty hearing?: Yes Does the patient have difficulty seeing, even when wearing glasses/contacts?: No Does the patient have difficulty concentrating, remembering, or making decisions?: No Patient able to express need for assistance with ADLs?: Yes Does the patient have difficulty dressing or bathing?: No Independently performs ADLs?: No Communication: Independent Dressing (OT): Independent Grooming: Independent Feeding: Independent Bathing: Needs assistance Is this a change from baseline?: Pre-admission baseline Toileting: Independent In/Out Bed: Independent Walks in Home: Needs assistance Is this a change from baseline?: Pre-admission baseline Does the patient have difficulty walking or climbing stairs?: Yes Weakness of Legs: Both Weakness of Arms/Hands: None  Home Assistive Devices/Equipment Home Assistive Devices/Equipment: Environmental consultant (specify type)  Therapy Consults (therapy consults require a physician order) PT Evaluation Needed: No OT Evalulation Needed: No SLP Evaluation Needed: No Abuse/Neglect Assessment (Assessment to be complete while patient is alone) Physical Abuse: Denies Verbal Abuse: Denies Sexual Abuse: Denies Exploitation of patient/patient's resources: Denies Self-Neglect: Denies Values / Beliefs Cultural Requests During Hospitalization: None Spiritual Requests During Hospitalization: None Consults Spiritual Care Consult Needed: No Social  Work Consult Needed: No Regulatory affairs officer (For Healthcare) Does Patient Have a Catering manager?: No Does patient want to make changes to medical advance directive?: No - Patient declined Type of Advance Directive:  [place] in Chart?: No - copy requested Would patient like information on creating a medical advance directive?: No - Patient declined          Disposition: Case was staffed with Romilda Garret FNP who recommended a inpatient admission (Geropsychiatry) to assist with stabilization.  Disposition Initial Assessment Completed for this Encounter: Yes Disposition of Patient: Admit Type of inpatient treatment program: Adult Patient refused recommended treatment: No Mode of transportation if patient is discharged/movement?: (Unk)  On Site Evaluation by:   Reviewed with Physician:    Mamie Nick 09/16/2018 3:11 PM

## 2018-09-17 DIAGNOSIS — F419 Anxiety disorder, unspecified: Secondary | ICD-10-CM

## 2018-09-17 DIAGNOSIS — R45851 Suicidal ideations: Secondary | ICD-10-CM

## 2018-09-17 LAB — CBG MONITORING, ED
Glucose-Capillary: 121 mg/dL — ABNORMAL HIGH (ref 70–99)
Glucose-Capillary: 77 mg/dL (ref 70–99)

## 2018-09-17 NOTE — Consult Note (Addendum)
[facility] Consult   Reason for Consult:  Anxiety and suicidal ideation Referring Physician:  EDP  Patient Identification: [name] MRN:  [id number] Principal Diagnosis: Anxiety Diagnosis:  Principal Problem:   Anxiety Active Problems:   Suicidal ideation   Total Time spent with patient: 30 minutes  Subjective:   [name] is a 73 y.o. female patient admitted with severe anxiety and suicidal ideation.  HPI:  Pt was seen and chart reviewed with treatment team and Dr Louretta Shorten. Pt recently moved from home to [facility] because she could not take care of herself at home and was falling down. She was weaned off her anxiety medications and she stated she has had increased anxiety and increased suicidal ideation. She does not like living there and stated she will find a way to complete suicide if she has to go back there. Pt has been calm and cooperative in the emergency room but wants help to become stable and have her medications adjusted. Pt is in need of a geriatric psychiatric admission for continued safety.   Past Psychiatric History: As above  Risk to Self: Suicidal Ideation: Yes-Currently Present Suicidal Intent: Yes-Currently Present Is patient at risk for suicide?: Yes Suicidal Plan?: Yes-Currently Present Specify Current Suicidal Plan: Cut wrists Access to Means: No What has been your use of drugs/alcohol within the last 12 months?: Denies How many times?: 0 Other Self Harm Risks: (Adjustment disorder relocating) Triggers for Past Attempts: Unknown Intentional Self Injurious Behavior: Cutting Comment - Self Injurious Behavior: Hx of cutting Risk to Others: Homicidal Ideation: No Thoughts of Harm to Others: No Current Homicidal Intent: No-Not Currently/Within Last 6 Months Current Homicidal Plan: No Access to Homicidal Means: No Identified Victim: NA History of harm to others?: No Assessment of Violence: None  Noted Violent Behavior Description: NA Does patient have access to weapons?: No Criminal Charges Pending?: No Does patient have a court date: No Prior Inpatient Therapy: Prior Inpatient Therapy: No Prior Outpatient Therapy: Prior Outpatient Therapy: Yes Prior Therapy Dates: Ongoing Prior Therapy Facilty/Provider(s): [facility] Reason for Treatment: Med mang Does patient have an ACCT team?: No Does patient have Intensive In-House Services?  : No Does patient have [facility] services? : No Does patient have P4CC services?: No  Past Medical History:  Past Medical History:  Diagnosis Date  . Acid reflux disease   . Acute kidney injury ([place])   . Anxiety   . Depression   . Diabetes mellitus without complication ([place])   . Dizziness   . Falls   . Hydrocephalus ([place])   . Hypertension   . Hypotension   . MVP (mitral valve prolapse)   . Sinus bradycardia   . Transaminitis     Past Surgical History:  Procedure Laterality Date  . BRAIN SURGERY    . BREAST BIOPSY Left   . CHOLECYSTECTOMY    . ERCP N/A 02/28/2014   Procedure: ENDOSCOPIC RETROGRADE CHOLANGIOPANCREATOGRAPHY (ERCP);  Surgeon: Gatha Mayer, MD;  Location: [facility] ENDOSCOPY;  Service: Endoscopy;  Laterality: N/A;  talked to tiffany from or /ebp  . ESOPHAGOGASTRODUODENOSCOPY N/A 02/28/2014   Procedure: ESOPHAGOGASTRODUODENOSCOPY (EGD);  Surgeon: Gatha Mayer, MD;  [facility];  Service: Endoscopy;  Laterality: N/A;  . VENTRICULOPERITONEAL SHUNT     right occipital   Family History:  Family History  Problem Relation Age of Onset  . Heart disease Mother   . Stroke Mother   . Hypertension Mother   . Alcohol abuse Father   .  Diabetes Father   . Cancer Neg Hx    Family Psychiatric  History: Father: Completed suicide Social History:  Social History   Substance and Sexual Activity  Alcohol Use No     Social History   Substance and Sexual Activity  Drug Use No    Social History   Socioeconomic History  .  Marital status: Divorced    Spouse name: Not on file  . Number of children: Not on file  . Years of education: Not on file  . Highest education level: Not on file  Occupational History  . Not on file  Social Needs  . Financial resource strain: Not on file  . Food insecurity:    Worry: Not on file    Inability: Not on file  . Transportation needs:    Medical: Not on file    Non-medical: Not on file  Tobacco Use  . Smoking status: Never Smoker  . Smokeless tobacco: Never Used  Substance and Sexual Activity  . Alcohol use: No  . Drug use: No  . Sexual activity: Not Currently  Lifestyle  . Physical activity:    Days per week: Not on file    Minutes per session: Not on file  . Stress: Not on file  Relationships  . Social connections:    Talks on phone: Not on file    Gets together: Not on file    Attends religious service: Not on file    Active member of club or organization: Not on file    Attends meetings of clubs or organizations: Not on file    Relationship status: Not on file  Other Topics Concern  . Not on file  Social History Narrative  . Not on file   Additional Social History:    Allergies:   Allergies  Allergen Reactions  . Metformin And Related Diarrhea    (takes metformin at home w/ meals)  . Ampicillin Hives    Did it involve swelling of the face/tongue/throat, SOB, or low BP? No Did it involve sudden or severe rash/hives, skin peeling, or any reaction on the inside of your mouth or nose? Yes Did you need to seek medical attention at a hospital or doctor's office? No When did it last happen?Over 10 years ago If all above answers are "NO", may proceed with cephalosporin use.  . Ace Inhibitors Other (See Comments)    Unknown rxn per pt  . Erythromycin Hives  . Penicillins Hives    Has patient had a PCN reaction causing immediate rash, facial/tongue/throat swelling, SOB or lightheadedness with hypotension: Yes Has patient had a PCN reaction causing  severe rash involving mucus membranes or skin necrosis: No Has patient had a PCN reaction that required hospitalization: No Has patient had a PCN reaction occurring within the last 10 years: No If all of the above answers are "NO", then may proceed with Cephalosporin use.   . Topamax [Topiramate] Rash    Labs:  Results for orders placed or performed during the hospital encounter of 09/16/18 (from the past 48 hour(s))  Rapid urine drug screen (hospital performed)     Status: None   Collection Time: 09/16/18 11:53 AM  Result Value Ref Range   Opiates NONE DETECTED NONE DETECTED   Cocaine NONE DETECTED NONE DETECTED   Benzodiazepines NONE DETECTED NONE DETECTED   Amphetamines NONE DETECTED NONE DETECTED   Tetrahydrocannabinol NONE DETECTED NONE DETECTED   Barbiturates NONE DETECTED NONE DETECTED    Comment: (NOTE) DRUG SCREEN  FOR MEDICAL PURPOSES ONLY.  IF CONFIRMATION IS NEEDED FOR ANY PURPOSE, NOTIFY LAB WITHIN 5 DAYS. LOWEST DETECTABLE LIMITS FOR URINE DRUG SCREEN Drug Class                     Cutoff (ng/mL) Amphetamine and metabolites    1000 Barbiturate and metabolites    200 Benzodiazepine                 400 Tricyclics and metabolites     300 Opiates and metabolites        300 Cocaine and metabolites        300 THC                            50 Performed at [facility], [place] [address], [place], [place] [address]   CBC with Differential/Platelet     Status: None   Collection Time: 09/16/18 12:24 PM  Result Value Ref Range   WBC 6.1 4.0 - 10.5 K/uL   RBC 4.64 3.87 - 5.11 MIL/uL   Hemoglobin 14.9 12.0 - 15.0 g/dL   HCT 45.3 36.0 - 46.0 %   MCV 97.6 80.0 - 100.0 fL   MCH 32.1 26.0 - 34.0 pg   MCHC 32.9 30.0 - 36.0 g/dL   RDW 12.0 11.5 - 15.5 %   Platelets 194 150 - 400 K/uL   nRBC 0.0 0.0 - 0.2 %   Neutrophils Relative % 71 %   Neutro Abs 4.4 1.7 - 7.7 K/uL   Lymphocytes Relative 19 %   Lymphs Abs 1.1 0.7 - 4.0 K/uL   Monocytes Relative 6 %    Monocytes Absolute 0.4 0.1 - 1.0 K/uL   Eosinophils Relative 3 %   Eosinophils Absolute 0.2 0.0 - 0.5 K/uL   Basophils Relative 1 %   Basophils Absolute 0.0 0.0 - 0.1 K/uL   Immature Granulocytes 0 %   Abs Immature Granulocytes 0.02 0.00 - 0.07 K/uL    Comment: Performed at [facility], [place] [address], [place], [place] [address]  Basic metabolic panel     Status: Abnormal   Collection Time: 09/16/18 12:24 PM  Result Value Ref Range   Sodium 139 135 - 145 mmol/L   Potassium 3.6 3.5 - 5.1 mmol/L   Chloride 103 98 - 111 mmol/L   CO2 27 22 - 32 mmol/L   Glucose, Bld 111 (H) 70 - 99 mg/dL   BUN 15 8 - 23 mg/dL   Creatinine, Ser 0.78 0.44 - 1.00 mg/dL   Calcium 9.4 8.9 - 10.3 mg/dL   GFR calc non Af Amer >60 >60 mL/min   GFR calc Af Amer >60 >60 mL/min   Anion gap 9 5 - 15    Comment: Performed at [facility] [place] [address], [place], [place] [address]  Ethanol     Status: None   Collection Time: 09/16/18 12:24 PM  Result Value Ref Range   Alcohol, Ethyl (B) <10 <10 mg/dL    Comment: (NOTE) Lowest detectable limit for serum alcohol is 10 mg/dL. For medical purposes only. Performed at [facility], [place] [address] [place],  [address]   Urinalysis, Routine w reflex microscopic     Status: None   Collection Time: 09/16/18  1:00 PM  Result Value Ref Range   Color, Urine YELLOW YELLOW   APPearance CLEAR CLEAR   Specific Gravity, Urine 1.014  1.005 - 1.030   pH 7.0 5.0 - 8.0   Glucose, UA NEGATIVE NEGATIVE mg/dL   Hgb urine dipstick NEGATIVE NEGATIVE   Bilirubin Urine NEGATIVE NEGATIVE   Ketones, ur NEGATIVE NEGATIVE mg/dL   Protein, ur NEGATIVE NEGATIVE mg/dL   Nitrite NEGATIVE NEGATIVE   Leukocytes,Ua NEGATIVE NEGATIVE    Comment: Performed at [place] [address] [place], [place] [address]  CBG monitoring, ED     Status: Abnormal   Collection Time: 09/16/18  3:30 PM  Result Value  Ref Range   Glucose-Capillary 101 (H) 70 - 99 mg/dL  CBG monitoring, ED     Status: None   Collection Time: 09/16/18  5:17 PM  Result Value Ref Range   Glucose-Capillary 91 70 - 99 mg/dL  CBG monitoring, ED     Status: Abnormal   Collection Time: 09/16/18  9:25 PM  Result Value Ref Range   Glucose-Capillary 117 (H) 70 - 99 mg/dL  CBG monitoring, ED     Status: Abnormal   Collection Time: 09/17/18  7:30 AM  Result Value Ref Range   Glucose-Capillary 121 (H) 70 - 99 mg/dL  CBG monitoring, ED     Status: None   Collection Time: 09/17/18 12:03 PM  Result Value Ref Range   Glucose-Capillary 77 70 - 99 mg/dL    Current Facility-Administered Medications  Medication Dose Route Frequency Provider Last Rate Last Dose  . acetaminophen (TYLENOL) tablet 650 mg  650 mg Oral Q6H PRN Dorie Rank, MD   650 mg at 09/17/18 1006  . amLODipine (NORVASC) tablet 5 mg  5 mg Oral Daily Blanchie Dessert, MD   5 mg at 09/17/18 1001  . ARIPiprazole (ABILIFY) tablet 2 mg  2 mg Oral Daily Blanchie Dessert, MD   2 mg at 09/17/18 1001  . atorvastatin (LIPITOR) tablet 40 mg  40 mg Oral Daily Blanchie Dessert, MD   40 mg at 09/16/18 1718  . insulin aspart (novoLOG) injection 0-15 Units  0-15 Units Subcutaneous TID WC Blanchie Dessert, MD   2 Units at 09/17/18 0900  . linagliptin (TRADJENTA) tablet 5 mg  5 mg Oral Daily Blanchie Dessert, MD   5 mg at 09/17/18 1002  . repaglinide (PRANDIN) tablet 2 mg  2 mg Oral TID AC Plunkett, Whitney, MD   2 mg at 09/17/18 0900  . traZODone (DESYREL) tablet 100 mg  100 mg Oral QHS Blanchie Dessert, MD   100 mg at 09/16/18 2146   Current Outpatient Medications  Medication Sig Dispense Refill  . acetaminophen (TYLENOL) 325 MG tablet Take 650 mg by mouth 2 (two) times daily.    Marland Kitchen amLODipine (NORVASC) 5 MG tablet Take 1 tablet (5 mg total) by mouth daily. 30 tablet 11  . ARIPiprazole (ABILIFY) 2 MG tablet Take 1 tablet (2 mg total) by mouth daily. 30 tablet 0  . atorvastatin  (LIPITOR) 40 MG tablet Take 1 tablet (40 mg total) by mouth daily. 90 tablet 1  . cyanocobalamin 2000 MCG tablet Take 1 tablet (2,000 mcg total) by mouth daily. 90 tablet 3  . Glycopyrrolate (LONHALA MAGNAIR REFILL KIT) 25 MCG/ML SOLN Inhale 1 puff into the lungs 2 (two) times daily. 60 mL 11  . JANUVIA 100 MG tablet TAKE 1 TABLET (100 MG TOTAL) BY MOUTH DAILY. 90 tablet 1  . LORazepam (ATIVAN) 1 MG tablet Take 0.5 tablets (0.5 mg total) by mouth every 8 (eight) hours as needed for anxiety. 30 tablet 0  . pantoprazole (PROTONIX)  40 MG tablet Take 1 tablet (40 mg total) by mouth daily. 90 tablet 3  . repaglinide (PRANDIN) 2 MG tablet TAKE 1 TABLET (2 MG TOTAL) BY MOUTH 3 (THREE) TIMES DAILY BEFORE MEALS. 90 tablet 9  . SUMAtriptan (IMITREX) 50 MG tablet TAKE 1 TABLET BY MOUTH AS NEEDED FOR MIGRAINE, CAN REPEAT IN 2 HOURS IF HEADACHE PERSISTS (Patient taking differently: Take 50 mg by mouth every 2 (two) hours as needed for migraine. ) 10 tablet 5  . tizanidine (ZANAFLEX) 2 MG capsule Take 1 capsule (2 mg total) by mouth 3 (three) times daily as needed. (Patient taking differently: Take 2 mg by mouth 3 (three) times daily as needed for muscle spasms. )    . traZODone (DESYREL) 50 MG tablet Take 100 mg by mouth at bedtime.   3  . cephALEXin (KEFLEX) 500 MG capsule Take 1 capsule (500 mg total) by mouth every 12 (twelve) hours. Stop after 07/26/18 (Patient not taking: Reported on 09/16/2018) 3 capsule 0  . glucose blood (ONE TOUCH ULTRA TEST) test strip USE TO CHECK BLOOD SUGAR 3 TIMES DAILY 100 each 11  . Insulin Pen Needle (NOVOFINE) 32G X 6 MM MISC 1 Act by Does not apply route daily. 100 each 3    Musculoskeletal: Strength & Muscle Tone: within normal limits Gait & Station: not observed Patient leans: N/A  Psychiatric Specialty Exam: Physical Exam  Constitutional: She is oriented to person, place, and time. She appears well-developed and well-nourished.  HENT:  Head: Normocephalic.   Musculoskeletal: Normal range of motion.  Neurological: She is alert and oriented to person, place, and time.  Psychiatric: Her speech is normal and behavior is normal. Her mood appears anxious. Cognition and memory are normal. She expresses impulsivity. She expresses suicidal ideation.    Review of Systems  Psychiatric/Behavioral: Positive for depression and suicidal ideas. The patient is nervous/anxious.   All other systems reviewed and are negative.   Blood pressure 117/67, pulse 85, temperature 98.5 F (36.9 C), temperature source Oral, resp. rate 18, height _0  (1.651 m), weight 94.3 kg, SpO2 92 %.Body mass index is 34.61 kg/m.  General Appearance: Casual  Eye Contact:  Good  Speech:  Clear and Coherent  Volume:  Normal  Mood:  Anxious, Depressed and Irritable  Affect:  Congruent  Thought Process:  Coherent, Linear and Descriptions of Associations: Intact  Orientation:  Full (Time, Place, and Person)  Thought Content:  Logical  Suicidal Thoughts:  Yes.  with intent/plan  Homicidal Thoughts:  No  Memory:  Immediate;   Good Recent;   Fair Remote;   Fair  Judgement:  Intact  Insight:  Present  Psychomotor Activity:  Normal  Concentration:  Concentration: Good and Attention Span: Good  Recall:  Good  Fund of Knowledge:  Good  Language:  Good  Akathisia:  No  Handed:  Right  AIMS (if indicated):     Assets:  Agricultural consultant Housing Social Support  ADL's:  Intact  Cognition:  WNL  Sleep:        Treatment Plan Summary: Daily contact with patient to assess and evaluate symptoms and progress in treatment and Medication management (see MAR)  Disposition: Recommend psychiatric Inpatient admission when medically cleared.  Ethelene Hal, NP 09/17/2018 12:14 PM   Patient seen face to face for this evaluation, case discussed with treatment team and physician extender and formulated treatment plan. Reviewed the information  documented and agree with the treatment plan.  Kiegan Macaraeg,  MD 09/17/2018

## 2018-09-17 NOTE — Progress Notes (Signed)
Pt has been accepted at [facility]. Accepting provider Dr. Gerrit Halls, MD. Number to call report is [phone]. Charge Nurse, Manuela Schwartz notified regarding disposition.   [name]. Guerry Bruin, MSW, [place] Work/Disposition Phone: [phone] Fax: [phone]

## 2018-09-17 NOTE — ED Notes (Signed)
Report given to [facility] RN who will resume care.

## 2018-09-17 NOTE — Progress Notes (Signed)
09/17/2018  [facility] for transport [phone].

## 2018-09-17 NOTE — Progress Notes (Signed)
09/17/2018 1225  Called report to [place] [phone] at [facility] in [place], [place]

## 2018-09-17 NOTE — Progress Notes (Signed)
Patient meets criteria for inpatient treatment. No appropriate or available beds at [facility]. CSW faxed referrals to the following facilities for review:  [facility]  [facility]  [facility]   TTS will continue to seek bed placement.  Chalmers Guest. Guerry Bruin, MSW, [place] Work/Disposition Phone: [phone] Fax: [phone]

## 2018-09-27 ENCOUNTER — Encounter: Payer: Self-pay | Admitting: Internal Medicine

## 2018-09-27 ENCOUNTER — Telehealth: Payer: Self-pay | Admitting: Internal Medicine

## 2018-09-27 ENCOUNTER — Encounter: Payer: Self-pay | Admitting: Diagnostic Neuroimaging

## 2018-09-27 ENCOUNTER — Ambulatory Visit: Payer: Medicare Other | Admitting: Diagnostic Neuroimaging

## 2018-09-27 ENCOUNTER — Ambulatory Visit: Payer: Medicare Other | Admitting: Internal Medicine

## 2018-09-27 VITALS — BP 142/88 | HR 96 | Temp 98.3°F | Resp 16 | Ht 65.0 in | Wt 207.0 lb

## 2018-09-27 VITALS — BP 139/79 | HR 82 | Ht 65.0 in | Wt 207.2 lb

## 2018-09-27 DIAGNOSIS — M170 Bilateral primary osteoarthritis of knee: Secondary | ICD-10-CM

## 2018-09-27 DIAGNOSIS — G3184 Mild cognitive impairment, so stated: Secondary | ICD-10-CM | POA: Diagnosis not present

## 2018-09-27 DIAGNOSIS — E118 Type 2 diabetes mellitus with unspecified complications: Secondary | ICD-10-CM

## 2018-09-27 DIAGNOSIS — I10 Essential (primary) hypertension: Secondary | ICD-10-CM | POA: Diagnosis not present

## 2018-09-27 LAB — POCT GLYCOSYLATED HEMOGLOBIN (HGB A1C): Hemoglobin A1C: 6.1 % — AB (ref 4.0–5.6)

## 2018-09-27 LAB — POCT GLUCOSE (DEVICE FOR HOME USE): Glucose Fasting, POC: 158 mg/dL — AB (ref 70–99)

## 2018-09-27 NOTE — Patient Instructions (Signed)
Type 2 Diabetes Mellitus, Diagnosis, Adult Type 2 diabetes (type 2 diabetes mellitus) is a long-term (chronic) disease. In type 2 diabetes, one or both of these problems may be present:  The pancreas does not make enough of a hormone called insulin.  Cells in the body do not respond properly to insulin that the body makes (insulin resistance). Normally, insulin allows blood sugar (glucose) to enter cells in the body. The cells use glucose for energy. Insulin resistance or lack of insulin causes excess glucose to build up in the blood instead of going into cells. As a result, high blood glucose (hyperglycemia) develops. What increases the risk? The following factors may make you more likely to develop type 2 diabetes:  Having a family member with type 2 diabetes.  Being overweight or obese.  Having an inactive (sedentary) lifestyle.  Having been diagnosed with insulin resistance.  Having a history of prediabetes, gestational diabetes, or polycystic ovary syndrome (PCOS).  Being of American-Indian, African-American, Hispanic/Latino, or Asian/Pacific Islander descent. What are the signs or symptoms? In the early stage of this condition, you may not have symptoms. Symptoms develop slowly and may include:  Increased thirst (polydipsia).  Increased hunger(polyphagia).  Increased urination (polyuria).  Increased urination during the night (nocturia).  Unexplained weight loss.  Frequent infections that keep coming back (recurring).  Fatigue.  Weakness.  Vision changes, such as blurry vision.  Cuts or bruises that are slow to heal.  Tingling or numbness in the hands or feet.  Dark patches on the skin (acanthosis nigricans). How is this diagnosed? This condition is diagnosed based on your symptoms, your medical history, a physical exam, and your blood glucose level. Your blood glucose may be checked with one or more of the following blood tests:  A fasting blood glucose (FBG)  test. You will not be allowed to eat (you will fast) for 8 hours or longer before a blood sample is taken.  A random blood glucose test. This test checks blood glucose at any time of day regardless of when you ate.  An A1c (hemoglobin A1c) blood test. This test provides information about blood glucose control over the previous 2-3 months.  An oral glucose tolerance test (OGTT). This test measures your blood glucose at two times: ? After fasting. This is your baseline blood glucose level. ? Two hours after drinking a beverage that contains glucose. You may be diagnosed with type 2 diabetes if:  Your FBG level is 126 mg/dL (7.0 mmol/L) or higher.  Your random blood glucose level is 200 mg/dL (11.1 mmol/L) or higher.  Your A1c level is 6.5% or higher.  Your OGTT result is higher than 200 mg/dL (11.1 mmol/L). These blood tests may be repeated to confirm your diagnosis. How is this treated? Your treatment may be managed by a specialist called an endocrinologist. Type 2 diabetes may be treated by following instructions from your health care provider about:  Making diet and lifestyle changes. This may include: ? Following an individualized nutrition plan that is developed by a diet and nutrition specialist (registered dietitian). ? Exercising regularly. ? Finding ways to manage stress.  Checking your blood glucose level as often as told.  Taking diabetes medicines or insulin daily. This helps to keep your blood glucose levels in the healthy range. ? If you use insulin, you may need to adjust the dosage depending on how physically active you are and what foods you eat. Your health care provider will tell you how to adjust your dosage.    Taking medicines to help prevent complications from diabetes, such as: ? Aspirin. ? Medicine to lower cholesterol. ? Medicine to control blood pressure. Your health care provider will set individualized treatment goals for you. Your goals will be based on  your age, other medical conditions you have, and how you respond to diabetes treatment. Generally, the goal of treatment is to maintain the following blood glucose levels:  Before meals (preprandial): 80-130 mg/dL (4.4-7.2 mmol/L).  After meals (postprandial): below 180 mg/dL (10 mmol/L).  A1c level: less than 7%. Follow these instructions at home: Questions to ask your health care provider  Consider asking the following questions: ? Do I need to meet with a diabetes educator? ? Where can I find a support group for people with diabetes? ? What equipment will I need to manage my diabetes at home? ? What diabetes medicines do I need, and when should I take them? ? How often do I need to check my blood glucose? ? What number can I call if I have questions? ? When is my next appointment? General instructions  Take over-the-counter and prescription medicines only as told by your health care provider.  Keep all follow-up visits as told by your health care provider. This is important.  For more information about diabetes, visit: ? American Diabetes Association (ADA): www.diabetes.org ? American Association of Diabetes Educators (AADE): www.diabeteseducator.org Contact a health care provider if:  Your blood glucose is at or above 240 mg/dL (13.3 mmol/L) for 2 days in a row.  You have been sick or have had a fever for 2 days or longer, and you are not getting better.  You have any of the following problems for more than 6 hours: ? You cannot eat or drink. ? You have nausea and vomiting. ? You have diarrhea. Get help right away if:  Your blood glucose is lower than 54 mg/dL (3.0 mmol/L).  You become confused or you have trouble thinking clearly.  You have difficulty breathing.  You have moderate or large ketone levels in your urine. Summary  Type 2 diabetes (type 2 diabetes mellitus) is a long-term (chronic) disease. In type 2 diabetes, the pancreas does not make enough of a  hormone called insulin, or cells in the body do not respond properly to insulin that the body makes (insulin resistance).  This condition is treated by making diet and lifestyle changes and taking diabetes medicines or insulin.  Your health care provider will set individualized treatment goals for you. Your goals will be based on your age, other medical conditions you have, and how you respond to diabetes treatment.  Keep all follow-up visits as told by your health care provider. This is important. This information is not intended to replace advice given to you by your health care provider. Make sure you discuss any questions you have with your health care provider. Document Released: 07/19/2005 Document Revised: 02/17/2017 Document Reviewed: 08/22/2015 Elsevier Interactive Patient Education  2019 [employer]  

## 2018-09-27 NOTE — Progress Notes (Signed)
Subjective:  Patient ID: [name], female    DOB: [birth year]  Age: 73 y.o. MRN: [id number]  CC: Osteoarthritis and Diabetes   HPI MAIE KESINGER presents for f/up - She has improved her diet significantly because she has been in a personal care home for the last few months.  She tells me her blood sugars have been well controlled.  She complains of worsening left knee pain over the last few months.  She tells me a month ago that her left knee buckled and now it feels unstable.  She complains of locking and clicking in both knees.  Outpatient Medications Prior to Visit  Medication Sig Dispense Refill  . acetaminophen (TYLENOL) 325 MG tablet Take 650 mg by mouth 2 (two) times daily.    Marland Kitchen amLODipine (NORVASC) 5 MG tablet Take 1 tablet (5 mg total) by mouth daily. 30 tablet 11  . ARIPiprazole (ABILIFY) 10 MG tablet Take 10 mg by mouth daily.    Marland Kitchen atorvastatin (LIPITOR) 40 MG tablet Take 1 tablet (40 mg total) by mouth daily. 90 tablet 1  . clonazePAM (KLONOPIN) 0.5 MG tablet Take 0.5 mg by mouth 3 (three) times daily.    Marland Kitchen JANUVIA 100 MG tablet TAKE 1 TABLET (100 MG TOTAL) BY MOUTH DAILY. 90 tablet 1  . pantoprazole (PROTONIX) 40 MG tablet Take 1 tablet (40 mg total) by mouth daily. 90 tablet 3  . prazosin (MINIPRESS) 2 MG capsule Take 2 mg by mouth at bedtime.    . repaglinide (PRANDIN) 2 MG tablet TAKE 1 TABLET (2 MG TOTAL) BY MOUTH 3 (THREE) TIMES DAILY BEFORE MEALS. 90 tablet 9  . sertraline (ZOLOFT) 50 MG tablet Take 50 mg by mouth daily.    Marland Kitchen tiotropium (SPIRIVA) 18 MCG inhalation capsule Place 18 mcg into inhaler and inhale daily.     No facility-administered medications prior to visit.     ROS Review of Systems  Constitutional: Negative.  Negative for chills, diaphoresis, fatigue and unexpected weight change.  HENT: Negative.   Eyes: Negative for visual disturbance.  Respiratory: Negative for cough, chest tightness and shortness of breath.   Cardiovascular:  Negative for chest pain, palpitations and leg swelling.  Gastrointestinal: Negative for abdominal pain, constipation, diarrhea, nausea and vomiting.  Genitourinary: Negative.  Negative for difficulty urinating.  Musculoskeletal: Positive for arthralgias. Negative for back pain and myalgias.  Skin: Negative for color change, pallor and rash.  Neurological: Negative for dizziness, weakness, light-headedness and headaches.  Hematological: Negative for adenopathy. Does not bruise/bleed easily.  Psychiatric/Behavioral: Negative.     Objective:  BP (!) 142/88 (BP Location: Left Arm, Patient Position: Sitting, Cuff Size: Large)   Pulse 96   Temp 98.3 F (36.8 C) (Oral)   Resp 16   Ht 5\' 5"  (1.651 m)   Wt 207 lb (93.9 kg)   SpO2 97%   BMI 34.45 kg/m   BP Readings from Last 3 Encounters:  09/27/18 (!) 142/88  09/27/18 139/79  09/17/18 139/74    Wt Readings from Last 3 Encounters:  09/27/18 207 lb (93.9 kg)  09/27/18 207 lb 3.2 oz (94 kg)  09/16/18 208 lb (94.3 kg)    Physical Exam Vitals signs reviewed.  Constitutional:      Appearance: She is obese. She is not ill-appearing or diaphoretic.  HENT:     Nose: Nose normal. No congestion or rhinorrhea.     Mouth/Throat:     Mouth: Mucous membranes are moist.     Pharynx:  Oropharynx is clear. No oropharyngeal exudate or posterior oropharyngeal erythema.  Eyes:     General: No scleral icterus.    Conjunctiva/sclera: Conjunctivae normal.  Neck:     Musculoskeletal: Normal range of motion and neck supple.  Cardiovascular:     Rate and Rhythm: Normal rate and regular rhythm.     Heart sounds: No murmur. No gallop.   Pulmonary:     Effort: Pulmonary effort is normal. No respiratory distress.     Breath sounds: No stridor. No wheezing, rhonchi or rales.  Abdominal:     General: Abdomen is flat. There is no distension.     Palpations: There is no mass.     Tenderness: There is no abdominal tenderness. There is no guarding.    Musculoskeletal:        General: No swelling.     Right knee: She exhibits deformity (DJD). She exhibits normal range of motion, no swelling and no bony tenderness. No tenderness found.     Left knee: She exhibits deformity (DJD). She exhibits normal range of motion and no swelling. No tenderness found.     Right lower leg: No edema.     Left lower leg: No edema.  Skin:    General: Skin is warm and dry.     Coloration: Skin is not pale.     Findings: No erythema or rash.  Neurological:     General: No focal deficit present.     Mental Status: She is oriented to person, place, and time. Mental status is at baseline.  Psychiatric:        Mood and Affect: Mood normal.        Behavior: Behavior normal.        Judgment: Judgment normal.     Lab Results  Component Value Date   WBC 6.1 09/16/2018   HGB 14.9 09/16/2018   HCT 45.3 09/16/2018   PLT 194 09/16/2018   GLUCOSE 111 (H) 09/16/2018   CHOL 257 (H) 07/04/2018   TRIG 293.0 (H) 07/04/2018   HDL 60.60 07/04/2018   LDLDIRECT 154.0 07/04/2018   LDLCALC 124 (H) 05/11/2016   ALT 13 07/22/2018   AST 18 07/22/2018   NA 139 09/16/2018   K 3.6 09/16/2018   CL 103 09/16/2018   CREATININE 0.78 09/16/2018   BUN 15 09/16/2018   CO2 27 09/16/2018   TSH 1.01 06/07/2018   INR 1.13 07/20/2018   HGBA1C 6.1 (A) 09/27/2018   MICROALBUR 2.3 (H) 06/07/2018    Dg Chest 1 View  Result Date: 09/16/2018 CLINICAL DATA:  Suicidal ideation. EXAM: CHEST  1 VIEW COMPARISON:  07/22/2018 FINDINGS: A right jugular catheter remains in place with suboptimal visualization of its tip due to superimposition of the spine and mediastinal structures. The cardiac silhouette is borderline enlarged. Lung volumes are increased compared to the prior study with decreased pulmonary vascular congestion. There are underlying mild chronic bronchitic changes. No airspace consolidation, edema, pleural effusion, pneumothorax is identified. No acute osseous abnormality is  seen. IMPRESSION: No active disease. Electronically Signed   By: Logan Bores M.D.   On: 09/16/2018 13:47    Assessment & Plan:   Sharin was seen today for osteoarthritis and diabetes.  Diagnoses and all orders for this visit:  Essential hypertension- Her blood pressure is adequately well controlled.  Type II diabetes mellitus with manifestations ([place])- Her A1c is at 6.1%.  Her blood sugars are well controlled.  She was praised for improving her lifestyle modifications.  Will  continue the DPP 4 inhibitor at the current dose. -     POCT glycosylated hemoglobin (Hb A1C) -     POCT Glucose (Device for Home Use)  Primary osteoarthritis of both knees -     Ambulatory referral to Orthopedic Surgery -     For Home Use Only Haivana Nakya for ice pack placement on knees three times a day for osteoarthritis.   I am having [name] maintain her pantoprazole, repaglinide, JANUVIA, atorvastatin, amLODipine, acetaminophen, ARIPiprazole, clonazePAM, tiotropium, sertraline, and prazosin.  No orders of the defined types were placed in this encounter.    Follow-up: No follow-ups on file.  [name], MD
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
## 2018-09-27 NOTE — Telephone Encounter (Signed)
Dropped off physician order sheet.  Has asked to be written on order:  1. Ice pack order  2. Discontinued medication (repaglinide tablet 2mg )  3. If Dr. Ronnald Ramp would like for her blood sugar to be tested.  States that if order is for blood sugar check of once per week that is no cost to the patient.  States that any checks above this would cost the patient up to $700.00.  States patient can not afford this amount of money.  I am placing form in [place] box.

## 2018-09-28 NOTE — Telephone Encounter (Signed)
Tanya Cullers calling from [employer] is calling calling check on the status of the orders. Please advise [phone] Phone [phone]

## 2018-09-29 NOTE — Telephone Encounter (Signed)
Contacted [name] and she picked up the orders yesterday and dropped.   Received fax requesting the dose of the tylenol prn order and also how often the prn ice  Pack can be given.   [place] and was unable to vm due to vm box was full.   RE: May inform that PCP is out of the office on Fridays and to please send blank order form.

## 2018-10-06 ENCOUNTER — Telehealth: Payer: Self-pay | Admitting: Internal Medicine

## 2018-10-06 NOTE — Telephone Encounter (Signed)
Copied from [place] [id number]. Topic: Quick Communication - See Telephone Encounter >> Oct 06, 2018  3:02 PM Rutherford Nail, NT wrote: CRM for notification. See Telephone encounter for: 10/06/18. Patient calling and would like to know if Dr Ronnald Ramp was able to get in contact with anyone at [facility] in regard to her being able to start back lidocaine patches again? Please advise.

## 2018-10-09 ENCOUNTER — Other Ambulatory Visit: Payer: Self-pay | Admitting: Internal Medicine

## 2018-10-09 DIAGNOSIS — G8929 Other chronic pain: Secondary | ICD-10-CM

## 2018-10-09 DIAGNOSIS — M5440 Lumbago with sciatica, unspecified side: Principal | ICD-10-CM

## 2018-10-09 MED ORDER — LIDOCAINE 5 % EX PTCH: 1.0000 | MEDICATED_PATCH | CUTANEOUS | 5 refills | Status: DC

## 2018-10-09 NOTE — Telephone Encounter (Signed)
PCP okay rx for lidocaine. It has been printed, signed and faxed back to [place].

## 2018-10-09 NOTE — Telephone Encounter (Signed)
Are you okay with pt using the lidocaine patches again? If so, can you print an rx for them please.

## 2018-10-11 NOTE — Telephone Encounter (Signed)
Tammy calling from [facility] to request an order to use lidocaine patches on left knee of the patient? Patient says she does not have back pain but needs it for left knee pain. Tammy needs to know if approved, when to apply the patch and when to remove it.

## 2018-10-12 NOTE — Telephone Encounter (Signed)
This has been faxed.

## 2018-10-18 ENCOUNTER — Ambulatory Visit: Payer: Self-pay | Admitting: *Deleted

## 2018-10-18 NOTE — Telephone Encounter (Signed)
Seth Bake from [place] is calling to report the caregiver for patient is reporting that patient is having SOB. Patient used to have nebulizer- but that was taken away when she started Spiriva. Patient has no medication to help her breathe.  Seth Bake reports she has checked the patient and she is SOB- but not in distress. Because patient is SOB and has no rescue inhaler and is not available to talk to- advised Seth Bake to call 911 for her.  Answer Assessment - Initial Assessment Questions 1. ACUTE COMPLAINT: "What is the main problem?" "Tell me what happened?"     SOB- no rescue inhaler- not with patient- call 911  Protocols used: 911 [facility]

## 2018-10-19 NOTE — Telephone Encounter (Addendum)
Spoke to [place]. She stated that the RN at nursing facility did not call 911.   Stated that she is feeling better and was not in respiratory distress.   Called pt and spoke with her. Pt stated that she is feeling fine.

## 2018-10-23 ENCOUNTER — Telehealth: Payer: Self-pay

## 2018-10-23 ENCOUNTER — Other Ambulatory Visit: Payer: Self-pay

## 2018-10-23 DIAGNOSIS — E118 Type 2 diabetes mellitus with unspecified complications: Secondary | ICD-10-CM

## 2018-10-23 MED ORDER — SITAGLIPTIN PHOSPHATE 100 MG PO TABS: 100.0000 mg | ORAL_TABLET | Freq: Every day | ORAL | 1 refills | Status: DC

## 2018-10-23 NOTE — Patient Outreach (Signed)
[facility]) Care Management  10/23/2018  [name] 1945-08-13 [id number]   Medication Adherence call to [name] [facility] Ident[facility] Verify spoke with patient she is not sure if she is to be taking Januvia 100 or Repaglinide she ask if we can call doctor's office and clarify. Doctor's office said  patient is no longer taking Repaglinide but she is still taking Januvia and they will call the patient and advise on her medications. [name] is showing past due under [place].   [facility] [place] [facility] Direct Dial [phone]  Fax [phone] [name]

## 2018-10-23 NOTE — Telephone Encounter (Signed)
[name] (Patient) [name] (Patient) General - Other  Summary: Medication question  Reason for CRM: Pt is due for meds and not sure if she is suppose to still be taking the repaglinide (PRANDIN) tablet 2 mg or if she is suppose to still be taking the JANUVIA 100 MG tablet / please call and advise     If Pt is to continue JANUVIA please send a Rx Refill to pharmacy

## 2018-10-24 ENCOUNTER — Other Ambulatory Visit: Payer: Self-pay | Admitting: Internal Medicine

## 2018-10-24 DIAGNOSIS — E118 Type 2 diabetes mellitus with unspecified complications: Secondary | ICD-10-CM

## 2018-10-24 DIAGNOSIS — J301 Allergic rhinitis due to pollen: Secondary | ICD-10-CM | POA: Insufficient documentation

## 2018-10-24 MED ORDER — SITAGLIPTIN PHOSPHATE 100 MG PO TABS: 100.0000 mg | ORAL_TABLET | Freq: Every day | ORAL | 1 refills | Status: DC

## 2018-10-24 MED ORDER — LEVOCETIRIZINE DIHYDROCHLORIDE 5 MG PO TABS: 5.0000 mg | ORAL_TABLET | Freq: Every evening | ORAL | 1 refills | Status: DC

## 2018-10-24 NOTE — Telephone Encounter (Signed)
Advised patient of dr Ronnald Ramp note/instructions, she repeated back for understanding

## 2018-10-24 NOTE — Telephone Encounter (Signed)
Her last A1c was 6.1%. I do not think she should take Prandin anymore. I have refilled the Januvia. I will send an antihistamine to her pharmacy for the runny nose.

## 2018-10-24 NOTE — Telephone Encounter (Signed)
Routing to dr jones, please advise, thanks 

## 2018-10-24 NOTE — Telephone Encounter (Signed)
This med should have been called into [place], [place] [address] [place] [address] [phone] (Phone)   Adria Devon also stated that pt is having running nose for a few months but worse lately due to allergies .  No fever nothing else.  She would like to know if there is something that could be called in for her   [phone]   Lidocaine  Patch order needs to be change.  They are trying to put it on her back and needs to go on her left knee

## 2018-12-05 ENCOUNTER — Telehealth: Payer: Self-pay | Admitting: Internal Medicine

## 2018-12-05 NOTE — Telephone Encounter (Signed)
Copied from [place] [id number]. Topic: Quick Communication - Rx Refill/Question >> Dec 05, 2018  2:03 PM Alanda Slim E wrote: Medication: SUMAtriptan (IMITREX) 50 MG tablet  Has the patient contacted their pharmacy? Yes- pharmacy needs a new Rx faxed over   Preferred Pharmacy (with phone number or street name): [place], [place] [address] [phone] (Phone) [phone] (Fax)    Agent: Please be advised that RX refills may take up to 3 business days. We ask that you follow-up with your pharmacy.

## 2018-12-05 NOTE — Telephone Encounter (Signed)
Copied from [place] [id number]. Topic: Quick Communication - Rx Refill/Question >> Dec 05, 2018  2:03 PM Alanda Slim E wrote: Medication: SUMAtriptan (IMITREX) 50 MG tablet  Has the patient contacted their pharmacy? Yes- pharmacy needs a new Rx faxed over   Preferred Pharmacy (with phone number or street name): [place], [place] [address] [phone] (Phone) [phone] (Fax)    Agent: Please be advised that RX refills may take up to 3 business days. We ask that you follow-up with your pharmacy.

## 2018-12-06 ENCOUNTER — Other Ambulatory Visit: Payer: Self-pay | Admitting: Internal Medicine

## 2018-12-06 DIAGNOSIS — G43001 Migraine without aura, not intractable, with status migrainosus: Secondary | ICD-10-CM

## 2018-12-06 MED ORDER — SUMATRIPTAN SUCCINATE 50 MG PO TABS: 50.0000 mg | ORAL_TABLET | ORAL | 2 refills | Status: DC | PRN

## 2018-12-06 NOTE — Telephone Encounter (Signed)
RX sent

## 2018-12-19 ENCOUNTER — Telehealth: Payer: Self-pay | Admitting: *Deleted

## 2018-12-19 NOTE — Telephone Encounter (Signed)
Per [name], patient is scheduled with Dr. Toy Care on 12/20/18 to do a virtual visit. However, this will be basically a "therapy session". The reason why [name] requested neuro psych evaluation is due to residual effects from her hydrocephalus.

## 2018-12-19 NOTE — Telephone Encounter (Signed)
The competency issue can be evaluated by Dr Toy Care  Lavone Orn

## 2018-12-19 NOTE — Telephone Encounter (Signed)
   I called Horris Latino- she states patient is having a real hard time with the restrictions at [facility] facility. The patient keeps requesting Horris Latino" to get her car and find her an apartment". The neuro psych has seen her once and felt ok with how the patient was at that time. Horris Latino is requesting PCP's advisement on how to get patient declared incompetent.  Horris Latino states the patient  makes statements like " I would rather be homeless or dead, than to stay at [facility]"   Please advise.

## 2018-12-19 NOTE — Telephone Encounter (Signed)
Are they willing to take her to see a psychiatrist for competency testing?

## 2018-12-20 NOTE — Telephone Encounter (Signed)
Called Bonnie and lvm with PCP response.

## 2019-01-01 ENCOUNTER — Telehealth: Payer: Self-pay | Admitting: Internal Medicine

## 2019-01-01 NOTE — Telephone Encounter (Signed)
Copied from [place] [phone]. Topic: Quick Communication - Home Health Verbal Orders >> Jan 01, 2019  3:34 PM Rutherford Nail, [place] wrote: Caller/Agency: [facility] Number: [phone] (Can leave a voicemail) Requesting OT/PT/Skilled Nursing/Social Work/Speech Therapy: Physical Therapy Frequency:   Requesting physical therapy evaluation

## 2019-01-02 NOTE — Telephone Encounter (Signed)
[name] with [facility] health called to follow up on previous message. Please advise Ph#  [phone] ok to LM

## 2019-01-02 NOTE — Telephone Encounter (Signed)
Routing to dr jones, please advise, I will call home health back, thanks 

## 2019-01-10 ENCOUNTER — Telehealth: Payer: Self-pay | Admitting: Internal Medicine

## 2019-01-10 NOTE — Telephone Encounter (Signed)
Copied from [place] [phone]. Topic: Quick Communication - Home Health Verbal Orders >> Jan 10, 2019  3:05 PM Gustavus Messing wrote: Caller/Agency: Shel[facility] Number: [phone] Levada Dy Requesting OT/PT/Skilled Nursing/Social Work/Speech Therapy: PT Frequency: Therapist has to evaluate to determine the frequency

## 2019-01-11 NOTE — Telephone Encounter (Signed)
lvm advising ok order from dr Jenny Reichmann, call back if any further questions

## 2019-01-11 NOTE — Telephone Encounter (Signed)
Ok for verbals 

## 2019-01-11 NOTE — Telephone Encounter (Signed)
Routing to dr Jenny Reichmann, please advise in the absence of dr Ronnald Ramp, I will call home health back, thanks

## 2019-01-30 ENCOUNTER — Ambulatory Visit: Payer: Self-pay | Admitting: Internal Medicine

## 2019-02-26 ENCOUNTER — Telehealth: Payer: Self-pay | Admitting: Internal Medicine

## 2019-02-26 NOTE — Telephone Encounter (Signed)
Adria Devon Friend, is calling to schedule an appt for the patient at [facility] Please call Nanine Means so that they can facilitate the virtual appt.

## 2019-02-26 NOTE — Telephone Encounter (Signed)
Called team health 7/26 at 7:17pm stating that patient was having incontinence.

## 2019-02-27 ENCOUNTER — Other Ambulatory Visit: Payer: Self-pay | Admitting: Internal Medicine

## 2019-02-27 DIAGNOSIS — N3281 Overactive bladder: Secondary | ICD-10-CM

## 2019-02-27 MED ORDER — SOLIFENACIN SUCCINATE 5 MG PO TABS: 5.0000 mg | ORAL_TABLET | Freq: Every day | ORAL | 1 refills | Status: DC

## 2019-02-27 MED ORDER — SOLIFENACIN SUCCINATE 5 MG PO TABS: 5.0000 mg | ORAL_TABLET | Freq: Every day | ORAL | 1 refills | Status: AC

## 2019-02-27 NOTE — Telephone Encounter (Signed)
[facility]

## 2019-02-27 NOTE — Telephone Encounter (Signed)
Contacted Bonie Clark  To [facility] in [place].   Called Tanya at [facility] [phone]) - Dea picked up and she stated that we send the rx to them at [phone] and to the pharmacy [facility] at [phone]

## 2019-02-27 NOTE — Telephone Encounter (Signed)
Spoke to [place] (pt caregiver) - Request for rx to help with incontinence.   FYI: Pt previously tried Mybetriq but had side effects.

## 2019-02-28 NOTE — Telephone Encounter (Signed)
Spoke to [place] and informed that an appt was not needed at this time. Horris Latino stated understanding.

## 2019-04-15 ENCOUNTER — Telehealth: Payer: Self-pay | Admitting: Family Medicine

## 2019-04-15 NOTE — Telephone Encounter (Signed)
Ophelia Shoulder is calling reporting that [name] had a fall Saturday and today she is c/o shoulder pain, right. Tylenol has not helped much with pain. She wants PCP to "follow" on pt.  Caller does not think she needs to go to the ER now.  They do not have the capability of doing imaging in house. Rest,Tylenol 500 mg 3-4 times as needed,and local ice.  If pain is worse or she has Kiara changes, she needs to be evaluated in the ER,where imaging can be done.  Ophelia Shoulder is asking me to convey message to Dr Adah Salvage office,so shoulder X ray can be arranged tomorrow.  Steel Kerney [place], MD

## 2019-04-16 ENCOUNTER — Telehealth: Payer: Self-pay | Admitting: Internal Medicine

## 2019-04-16 ENCOUNTER — Other Ambulatory Visit: Payer: Self-pay | Admitting: Internal Medicine

## 2019-04-16 DIAGNOSIS — G8929 Other chronic pain: Secondary | ICD-10-CM

## 2019-04-16 DIAGNOSIS — M5440 Lumbago with sciatica, unspecified side: Secondary | ICD-10-CM

## 2019-04-16 MED ORDER — LIDOCAINE 5 % EX PTCH: 1.0000 | MEDICATED_PATCH | CUTANEOUS | 5 refills | Status: AC

## 2019-04-16 NOTE — Telephone Encounter (Addendum)
[name] with [facility] home [facility] calling to request a refill of the   lidocaine (LIDODERM) 5 %  This is for shoulder pain. (both) pt had a fall on 9/12 from her bed. Pt refused ED,  Did not go to ED, but is walking and talking today, been to both meals.  Just sore. Requesting this for pain.  [facility], but they [facility] not use [facility] the home [facility] tonya is going to fax an order form to the office. And besides the other order for the patches was for her knee.  Now this is shoulder pain.  cb [place], [place] [address] [phone] (Phone) [phone] (Fax)

## 2019-04-19 ENCOUNTER — Telehealth: Payer: Self-pay

## 2019-04-19 NOTE — Telephone Encounter (Signed)
Yes on the orders  TJ

## 2019-04-19 NOTE — Telephone Encounter (Signed)
Waiting on the fax to come through. Then can send orders.

## 2019-04-19 NOTE — Telephone Encounter (Signed)
Copied from [place] [phone] [phone]. Topic: General - Other >> Apr 19, 2019  1:18 PM Antonieta Iba C wrote: Reason for CRM: Adria Devon - POA for pt is calling in to speak with PCP assistant Arliss Journey   Please call back at: [phone]

## 2019-04-19 NOTE — Telephone Encounter (Signed)
Patient has an appt for Tuesday due to her shoulder pain.   Horris Latino is requesting future orders allowing [facility] to admin Tylenol for Pain and Fever PRN and Lidocaine sig change to Use PRN for any joint pain.   Also, may [facility] have okay to place ice on shoulder?    I called and LVM for Tanya to fax new form over to side A fax machine.

## 2019-04-19 NOTE — Telephone Encounter (Signed)
I have sent a request to PCP for change in another note. Closing this note.

## 2019-04-19 NOTE — Telephone Encounter (Signed)
Caller name: Horris Latino  Call back number: [phone]   Reason for call: [name] is requesting a new script to reflect lidocaine (LIDODERM) 5 % may apply to shoulder, knee, back and any joint area experiencing pain (has to reflect on Rx).   Requesting Tylenol can administer for pain discomfort and fever (has to reflect on Rx)   Please fax the the following prescription mentioned above to [facility]. As per Horris Latino the fax # is in patient chart.

## 2019-04-23 ENCOUNTER — Telehealth: Payer: Self-pay | Admitting: Internal Medicine

## 2019-04-23 NOTE — Telephone Encounter (Signed)
Medication Refill - Medication: LORazepam (ATIVAN) tablet 1 mg  Markesha from [facility] called to request a refill for Ativan for pt.  Preferred Pharmacy:  [place], [place] [address] [phone] (Phone) [phone] (Fax)

## 2019-04-24 ENCOUNTER — Ambulatory Visit (INDEPENDENT_AMBULATORY_CARE_PROVIDER_SITE_OTHER)
Admission: RE | Admit: 2019-04-24 | Discharge: 2019-04-24 | Disposition: A | Discharge status: Home / Self Care | Payer: Medicare Other | Source: Ambulatory Visit | Attending: Internal Medicine | Admitting: Internal Medicine

## 2019-04-24 ENCOUNTER — Other Ambulatory Visit: Payer: Self-pay

## 2019-04-24 ENCOUNTER — Encounter: Payer: Self-pay | Admitting: Internal Medicine

## 2019-04-24 ENCOUNTER — Ambulatory Visit (INDEPENDENT_AMBULATORY_CARE_PROVIDER_SITE_OTHER): Payer: Medicare Other | Admitting: Internal Medicine

## 2019-04-24 VITALS — BP 120/78 | HR 90 | Temp 98.6°F | Resp 16 | Ht 65.0 in | Wt 207.0 lb

## 2019-04-24 DIAGNOSIS — N3281 Overactive bladder: Secondary | ICD-10-CM | POA: Diagnosis not present

## 2019-04-24 DIAGNOSIS — Z23 Encounter for immunization: Secondary | ICD-10-CM

## 2019-04-24 DIAGNOSIS — S59901A Unspecified injury of right elbow, initial encounter: Secondary | ICD-10-CM | POA: Insufficient documentation

## 2019-04-24 DIAGNOSIS — S4991XA Unspecified injury of right shoulder and upper arm, initial encounter: Secondary | ICD-10-CM

## 2019-04-24 MED ORDER — MIRABEGRON ER 25 MG PO TB24: 25.0000 mg | ORAL_TABLET | Freq: Every day | ORAL | 1 refills | Status: AC

## 2019-04-24 MED ORDER — ZOSTER VAC RECOMB ADJUVANTED 50 MCG/0.5ML IM SUSR: 0.5000 mL | Freq: Once | INTRAMUSCULAR | 1 refills | Status: AC

## 2019-04-24 NOTE — Progress Notes (Addendum)
Subjective:  Patient ID: [name], female    DOB: [birth year]  Age: 73 y.o. MRN: [id number]  CC: Osteoarthritis   HPI [name] presents for concerns about right shoulder and right elbow pain 1 week status post fall.  [name] has not noticed any bruising or swelling.  [name] has normal range of motion in the right elbow.  [name] complains of limited range of motion the right shoulder.  [name] is receiving topical and oral modalities to treat the pain at her personal care home.  [name] also complains of frequent urination and wants to know if something can be added to [facility] for better symptom relief.  Outpatient Medications Prior to Visit  Medication Sig Dispense Refill  . acetaminophen (TYLENOL) 325 MG tablet Take 650 mg by mouth 2 (two) times daily.    Marland Kitchen amLODipine (NORVASC) 5 MG tablet Take 1 tablet (5 mg total) by mouth daily. 30 tablet 11  . ARIPiprazole (ABILIFY) 10 MG tablet Take 10 mg by mouth daily.    Marland Kitchen atorvastatin (LIPITOR) 40 MG tablet Take 1 tablet (40 mg total) by mouth daily. 90 tablet 1  . clonazePAM (KLONOPIN) 0.5 MG tablet Take 0.5 mg by mouth 3 (three) times daily.    Marland Kitchen levocetirizine (XYZAL) 5 MG tablet Take 1 tablet (5 mg total) by mouth every evening. 90 tablet 1  . lidocaine (LIDODERM) 5 % Place 1 patch onto the skin daily. Remove & Discard patch within 12 hours or as directed by MD 30 patch 5  . pantoprazole (PROTONIX) 40 MG tablet Take 1 tablet (40 mg total) by mouth daily. 90 tablet 3  . prazosin (MINIPRESS) 2 MG capsule Take 2 mg by mouth at bedtime.    . sertraline (ZOLOFT) 50 MG tablet Take 50 mg by mouth daily.    . sitaGLIPtin (JANUVIA) 100 MG tablet Take 1 tablet (100 mg total) by mouth daily. 90 tablet 1  . solifenacin (VESICARE) 5 MG tablet Take 1 tablet (5 mg total) by mouth daily. 90 tablet 1  . SUMAtriptan (IMITREX) 50 MG tablet Take 1 tablet (50 mg total) by mouth every 2 (two) hours as needed for migraine. May repeat in 2 hours if headache persists  or recurs. 10 tablet 2  . tiotropium (SPIRIVA) 18 MCG inhalation capsule Place 18 mcg into inhaler and inhale daily.     No facility-administered medications prior to visit.     ROS Review of Systems  Constitutional: Negative.   HENT: Negative.   Eyes: Negative for visual disturbance.  Respiratory: Negative for cough, chest tightness and shortness of breath.   Cardiovascular: Negative for chest pain and leg swelling.  Gastrointestinal: Negative for abdominal pain, constipation and diarrhea.  Endocrine: Positive for polyuria.  Genitourinary: Positive for frequency. Negative for decreased urine volume, difficulty urinating, dysuria, hematuria and urgency.  Musculoskeletal: Positive for arthralgias. Negative for back pain and neck pain.  Skin: Negative for color change and pallor.  Neurological: Negative.  Negative for dizziness, weakness and light-headedness.  Hematological: Negative for adenopathy. Does not bruise/bleed easily.  Psychiatric/Behavioral: Negative.     Objective:  BP 120/78 (BP Location: Left Arm, Patient Position: Sitting, Cuff Size: Large)   Pulse 90   Temp 98.6 F (37 C) (Oral)   Resp 16   Ht 5\' 5"  (1.651 m)   Wt 207 lb (93.9 kg)   SpO2 96%   BMI 34.45 kg/m   BP Readings from Last 3 Encounters:  04/24/19 120/78  09/27/18 (!) 142/88  09/27/18 139/79    Wt Readings from Last 3 Encounters:  04/24/19 207 lb (93.9 kg)  09/27/18 207 lb (93.9 kg)  09/27/18 207 lb 3.2 oz (94 kg)    Physical Exam Vitals signs reviewed.  Constitutional:      Appearance: [name] is obese. [name] is not ill-appearing or diaphoretic.  HENT:     Head: Normocephalic.     Nose: Nose normal.     Mouth/Throat:     Mouth: Mucous membranes are moist.     Pharynx: No oropharyngeal exudate.  Eyes:     General: No scleral icterus. Neck:     Musculoskeletal: Normal range of motion and neck supple. No muscular tenderness.  Cardiovascular:     Rate and Rhythm: Normal rate and regular  rhythm.  Pulmonary:     Effort: Pulmonary effort is normal.     Breath sounds: No stridor. No wheezing, rhonchi or rales.  Abdominal:     General: Abdomen is protuberant. Bowel sounds are normal. There is no distension.     Palpations: There is no hepatomegaly or splenomegaly.     Tenderness: There is no abdominal tenderness.  Musculoskeletal: Normal range of motion.     Right shoulder: [name] exhibits no tenderness, no bony tenderness, no swelling, no effusion, no deformity and no pain.     Right elbow: Normal.[name] exhibits normal range of motion, no swelling and no deformity. No tenderness found.     Right lower leg: No edema.     Left lower leg: No edema.  Skin:    General: Skin is warm and dry.  Neurological:     General: No focal deficit present.     Mental Status: [name] is alert.  Psychiatric:        Mood and Affect: Mood normal.        Behavior: Behavior normal.     Lab Results  Component Value Date   WBC 6.1 09/16/2018   HGB 14.9 09/16/2018   HCT 45.3 09/16/2018   PLT 194 09/16/2018   GLUCOSE 111 (H) 09/16/2018   CHOL 257 (H) 07/04/2018   TRIG 293.0 (H) 07/04/2018   HDL 60.60 07/04/2018   LDLDIRECT 154.0 07/04/2018   LDLCALC 124 (H) 05/11/2016   ALT 13 07/22/2018   AST 18 07/22/2018   NA 139 09/16/2018   K 3.6 09/16/2018   CL 103 09/16/2018   CREATININE 0.78 09/16/2018   BUN 15 09/16/2018   CO2 27 09/16/2018   TSH 1.01 06/07/2018   INR 1.13 07/20/2018   HGBA1C 6.1 (A) 09/27/2018   MICROALBUR 2.3 (H) 06/07/2018    Dg Chest 1 View  Result Date: 09/16/2018 CLINICAL DATA:  Suicidal ideation. EXAM: CHEST  1 VIEW COMPARISON:  07/22/2018 FINDINGS: A right jugular catheter remains in place with suboptimal visualization of its tip due to superimposition of the spine and mediastinal structures. The cardiac silhouette is borderline enlarged. Lung volumes are increased compared to the prior study with decreased pulmonary vascular congestion. There are underlying mild chronic  bronchitic changes. No airspace consolidation, edema, pleural effusion, pneumothorax is identified. No acute osseous abnormality is seen. IMPRESSION: No active disease. Electronically Signed   By: Logan Bores M.D.   On: 09/16/2018 13:47    No results found.  Assessment & Plan:   Mildreth was seen today for osteoarthritis.  Diagnoses and all orders for this visit:  Injury of right shoulder, initial encounter- Exam shows slight decrease in range of motion, Xray is negative for fracture, I have  asked her to continue to work on the range of motion of the right shoulder and to continue the current modalities for pain relief. -     DG Shoulder Right; Future  Elbow injury, right, initial encounter- Exam is normal, plain films are negative for fracture, will continue to treat with the current modalities. -     DG Elbow Complete Right; Future  OAB (overactive bladder)- I have asked her to add Myrbetriq to the Vesicare. -     mirabegron ER (MYRBETRIQ) 25 MG TB24 tablet; Take 1 tablet (25 mg total) by mouth daily.  Need for influenza vaccination -     Flu Vaccine QUAD High Dose(Fluad)  Need for shingles vaccine -     Zoster Vaccine Adjuvanted [facility]) injection; Inject 0.5 mLs into the muscle once for 1 dose.   I am having [name] start on mirabegron ER and Zoster Vaccine Adjuvanted. I am also having her maintain her pantoprazole, atorvastatin, amLODipine, acetaminophen, ARIPiprazole, clonazePAM, tiotropium, sertraline, prazosin, sitaGLIPtin, levocetirizine, SUMAtriptan, solifenacin, and lidocaine.  Meds ordered this encounter  Medications  . mirabegron ER (MYRBETRIQ) 25 MG TB24 tablet    Sig: Take 1 tablet (25 mg total) by mouth daily.    Dispense:  90 tablet    Refill:  1  . Zoster Vaccine Adjuvanted [facility]) injection    Sig: Inject 0.5 mLs into the muscle once for 1 dose.    Dispense:  0.5 mL    Refill:  1     Follow-up: Return in about 3 weeks (around 05/15/2019).   [name], MD

## 2019-04-24 NOTE — Patient Instructions (Addendum)
Shoulder Pain Many things can cause shoulder pain, including:  An injury to the shoulder.  Overuse of the shoulder.  Arthritis. The source of the pain can be:  Inflammation.  An injury to the shoulder joint.  An injury to a tendon, ligament, or bone. Follow these instructions at home: Pay attention to changes in your symptoms. Let your health care provider know about them. Follow these instructions to relieve your pain. If you have a sling:  Wear the sling as told by your health care provider. Remove it only as told by your health care provider.  Loosen the sling if your fingers tingle, become numb, or turn cold and blue.  Keep the sling clean.  If the sling is not waterproof: ? Do not let it get wet. Remove it to shower or bathe.  Move your arm as little as possible, but keep your hand moving to prevent swelling. Managing pain, stiffness, and swelling   If directed, put ice on the painful area: ? Put ice in a plastic bag. ? Place a towel between your skin and the bag. ? Leave the ice on for 20 minutes, 2-3 times per day. Stop applying ice if it does not help with the pain.  Squeeze a soft ball or a foam pad as much as possible. This helps to keep the shoulder from swelling. It also helps to strengthen the arm. General instructions  Take over-the-counter and prescription medicines only as told by your health care provider.  Keep all follow-up visits as told by your health care provider. This is important. Contact a health care provider if:  Your pain gets worse.  Your pain is not relieved with medicines.  New pain develops in your arm, hand, or fingers. Get help right away if:  Your arm, hand, or fingers: ? Tingle. ? Become numb. ? Become swollen. ? Become painful. ? Turn white or blue. Summary  Shoulder pain can be caused by an injury, overuse, or arthritis.  Pay attention to changes in your symptoms. Let your health care provider know about them.   This condition may be treated with a sling, ice, and pain medicines.  Contact your health care provider if the pain gets worse or new pain develops. Get help right away if your arm, hand, or fingers tingle or become numb, swollen, or painful.  Keep all follow-up visits as told by your health care provider. This is important. This information is not intended to replace advice given to you by your health care provider. Make sure you discuss any questions you have with your health care provider. Document Released: 04/28/2005 Document Revised: 01/31/2018 Document Reviewed: 01/31/2018 Elsevier Patient Education  [place].  Kegel Exercises  Kegel exercises can help strengthen your pelvic floor muscles. The pelvic floor is a group of muscles that support your rectum, small intestine, and bladder. In females, pelvic floor muscles also help support the womb (uterus). These muscles help you control the flow of urine and stool. Kegel exercises are painless and simple, and they do not require any equipment. Your provider may suggest Kegel exercises to:  Improve bladder and bowel control.  Improve sexual response.  Improve weak pelvic floor muscles after surgery to remove the uterus (hysterectomy) or pregnancy (females).  Improve weak pelvic floor muscles after prostate gland removal or surgery (males). Kegel exercises involve squeezing your pelvic floor muscles, which are the same muscles you squeeze when you try to stop the flow of urine or keep from passing gas.  The exercises can be done while sitting, standing, or lying down, but it is best to vary your position. Exercises How to do Kegel exercises: 1. Squeeze your pelvic floor muscles tight. You should feel a tight lift in your rectal area. If you are a female, you should also feel a tightness in your vaginal area. Keep your stomach, buttocks, and legs relaxed. 2. Hold the muscles tight for up to 10 seconds. 3. Breathe normally. 4. Relax  your muscles. 5. Repeat as told by your health care provider. Repeat this exercise daily as told by your health care provider. Continue to do this exercise for at least 4-6 weeks, or for as long as told by your health care provider. You may be referred to a physical therapist who can help you learn more about how to do Kegel exercises. Depending on your condition, your health care provider may recommend:  Varying how long you squeeze your muscles.  Doing several sets of exercises every day.  Doing exercises for several weeks.  Making Kegel exercises a part of your regular exercise routine. This information is not intended to replace advice given to you by your health care provider. Make sure you discuss any questions you have with your health care provider. Document Released: 07/05/2012 Document Revised: 03/08/2018 Document Reviewed: 03/08/2018 Elsevier Patient Education  2020 [employer].

## 2019-04-25 ENCOUNTER — Encounter: Payer: Self-pay | Admitting: Podiatry

## 2019-04-25 ENCOUNTER — Ambulatory Visit (INDEPENDENT_AMBULATORY_CARE_PROVIDER_SITE_OTHER): Payer: Medicare Other | Admitting: Podiatry

## 2019-04-25 DIAGNOSIS — M79674 Pain in right toe(s): Secondary | ICD-10-CM

## 2019-04-25 DIAGNOSIS — B351 Tinea unguium: Secondary | ICD-10-CM | POA: Diagnosis not present

## 2019-04-25 DIAGNOSIS — M79675 Pain in left toe(s): Secondary | ICD-10-CM | POA: Diagnosis not present

## 2019-04-25 DIAGNOSIS — E119 Type 2 diabetes mellitus without complications: Secondary | ICD-10-CM

## 2019-04-25 NOTE — Progress Notes (Signed)
Complaint:  Visit Type: Patient returns to my office for continued preventative foot care services. Complaint: Patient states" my  big nails have grown long and thick and become painful to walk and wear shoes" Patient has been diagnosed with DM with no foot complications. The patient presents for preventative foot care services. No changes to ROS  Podiatric Exam: Vascular: dorsalis pedis and posterior tibial pulses are palpable bilateral. Capillary return is immediate. Temperature gradient is WNL. Skin turgor WNL  Sensorium: Normal Semmes Weinstein monofilament test. Normal tactile sensation bilaterally. Nail Exam: Pt has thick disfigured discolored nails with subungual debris noted bilateral entire nail hallux through fifth toenails Ulcer Exam: There is no evidence of ulcer or pre-ulcerative changes or infection. Orthopedic Exam: Muscle tone and strength are WNL. No limitations in general ROM. No crepitus or effusions noted. Foot type and digits show no abnormalities. Bony prominences are unremarkable. Skin: No Porokeratosis. No infection or ulcers  Diagnosis:  Onychomycosis, , Pain in right toe, pain in left toes  Treatment & Plan Procedures and Treatment: Consent by patient was obtained for treatment procedures.   Debridement of mycotic and hypertrophic toenails, 1 through 5 bilateral and clearing of subungual debris. No ulceration, no infection noted.  Return Visit-Office Procedure: Patient instructed to return to the office for a follow up visit 6  months for continued evaluation and treatment.    Brehanna Deveny DPM 

## 2019-04-27 NOTE — Telephone Encounter (Signed)
Order completed while pt was in office.

## 2019-04-30 ENCOUNTER — Other Ambulatory Visit: Payer: Self-pay | Admitting: Internal Medicine

## 2019-04-30 DIAGNOSIS — Z1231 Encounter for screening mammogram for malignant neoplasm of breast: Secondary | ICD-10-CM

## 2019-05-16 ENCOUNTER — Emergency Department (HOSPITAL_COMMUNITY)
Admission: EM | Admit: 2019-05-16 | Discharge: 2019-05-16 | Disposition: A | Discharge status: Home / Self Care | Payer: Medicare Other | Attending: Emergency Medicine | Admitting: Emergency Medicine

## 2019-05-16 ENCOUNTER — Emergency Department (HOSPITAL_COMMUNITY): Payer: Medicare Other

## 2019-05-16 ENCOUNTER — Encounter (HOSPITAL_COMMUNITY): Payer: Self-pay

## 2019-05-16 ENCOUNTER — Other Ambulatory Visit: Payer: Self-pay

## 2019-05-16 DIAGNOSIS — Z79899 Other long term (current) drug therapy: Secondary | ICD-10-CM | POA: Diagnosis not present

## 2019-05-16 DIAGNOSIS — W06XXXA Fall from bed, initial encounter: Secondary | ICD-10-CM | POA: Insufficient documentation

## 2019-05-16 DIAGNOSIS — W19XXXA Unspecified fall, initial encounter: Secondary | ICD-10-CM

## 2019-05-16 DIAGNOSIS — I10 Essential (primary) hypertension: Secondary | ICD-10-CM | POA: Diagnosis not present

## 2019-05-16 DIAGNOSIS — Y92122 Bedroom in nursing home as the place of occurrence of the external cause: Secondary | ICD-10-CM | POA: Insufficient documentation

## 2019-05-16 DIAGNOSIS — E119 Type 2 diabetes mellitus without complications: Secondary | ICD-10-CM | POA: Diagnosis not present

## 2019-05-16 DIAGNOSIS — Y999 Unspecified external cause status: Secondary | ICD-10-CM | POA: Insufficient documentation

## 2019-05-16 DIAGNOSIS — Z8669 Personal history of other diseases of the nervous system and sense organs: Secondary | ICD-10-CM | POA: Diagnosis not present

## 2019-05-16 DIAGNOSIS — M25512 Pain in left shoulder: Secondary | ICD-10-CM | POA: Insufficient documentation

## 2019-05-16 DIAGNOSIS — Y9384 Activity, sleeping: Secondary | ICD-10-CM | POA: Diagnosis not present

## 2019-05-16 DIAGNOSIS — Z982 Presence of cerebrospinal fluid drainage device: Secondary | ICD-10-CM | POA: Diagnosis not present

## 2019-05-16 DIAGNOSIS — M542 Cervicalgia: Secondary | ICD-10-CM | POA: Insufficient documentation

## 2019-05-16 DIAGNOSIS — Z7984 Long term (current) use of oral hypoglycemic drugs: Secondary | ICD-10-CM | POA: Insufficient documentation

## 2019-05-16 MED ORDER — ACETAMINOPHEN 325 MG PO TABS
650.0000 mg | ORAL_TABLET | Freq: Once | ORAL | Status: AC
  Administered 2019-05-16: 650 mg via ORAL
  Filled 2019-05-16: qty 2

## 2019-05-16 NOTE — Discharge Instructions (Signed)
Your x-ray of your shoulder was normal.  You can take Tylenol as needed for pain.  Do not exceed 4000 mg of Tylenol a day.  As we discussed, your CT head and CT spine were unremarkable for any acute injuries.  They did mention some increased fluid around your VP shunt.  Please follow-up with your doctor regarding this.  Return emergency department for worsening pain, vomiting, difficulty walking, numbness/weakness of your arms or legs or any other worsening or concerning injury.

## 2019-05-16 NOTE — ED Triage Notes (Signed)
Fall, pt had a unwitnessed fall at her facility, possibly falling out of bed, she complains of head pain and left shoulder pain

## 2019-05-16 NOTE — ED Provider Notes (Signed)
[facility] DEPT Provider Note   CSN: [id number] Arrival date & time: 05/16/19  0227     History   Chief Complaint No chief complaint on file.   HPI [name] is a 73 y.o. female.     Patient with past medical history notable for hypertension, diabetes, frequent falls, and more as listed, presents to the emergency department with a chief complaint of fall from bed.  She complains of left shoulder and neck pain.  She is unable to describe what happened to her.  She is from an assisted living facility.  She denies any significant pain now.  Denies any numbness, weakness, or tingling.  Denies any treatments prior to arrival.  The history is provided by the patient. No language interpreter was used.    Past Medical History:  Diagnosis Date  . Acid reflux disease   . Acute kidney injury ([place])   . Anxiety   . Chronic bronchitis ([place])   . Depression   . Diabetes mellitus without complication ([place])   . Dizziness   . Falls   . High cholesterol   . Hydrocephalus ([place])   . Hypertension   . Hypotension   . MVP (mitral valve prolapse)   . Sinus bradycardia   . Transaminitis     Patient Active Problem List   Diagnosis Date Noted  . Diabetes mellitus without complication ([place]) [id number]  . Injury of right shoulder 04/24/2019  . Elbow injury, right, initial encounter 04/24/2019  . Seasonal allergic rhinitis due to pollen 10/24/2018  . Carpal tunnel syndrome of left wrist 03/07/2018  . Chronic midline low back pain 09/22/2017  . 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 Simple chronic bronchitis ([place]) 06/28/2017  . Excessive daytime sleepiness 12/21/2016  . Migraine without aura and with status migrainosus, not intractable 03/04/2016  . Left ventricular diastolic dysfunction, NYHA class 2 01/21/2016  . Morbid obesity ([place]) 01/21/2016  . 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  Cervical radiculitis 08/07/2015  . GAD (generalized anxiety disorder) 07/18/2014  . Primary osteoarthritis of both knees 05/23/2014  . Visit for screening mammogram 05/22/2014  . Hyperlipidemia with target LDL less than 100 05/22/2014  . Vitamin D deficiency 12/28/2013  . Estrogen deficiency 12/27/2013  . Routine general medical examination at a health care facility 12/27/2013  . Type II diabetes mellitus with manifestations ([place]) 08/15/2013  . OAB (overactive bladder) 08/15/2013  . Vitamin B12 deficiency anemia 06/05/2010  . HYDROCEPHALUS, NORMAL PRESSURE 06/03/2010  . Anxiety 05/15/2008  . Essential hypertension 05/15/2008    Past Surgical History:  Procedure Laterality Date  . BRAIN SURGERY    . BREAST BIOPSY Left   . CHOLECYSTECTOMY    . ERCP N/A 02/28/2014   Procedure: ENDOSCOPIC RETROGRADE CHOLANGIOPANCREATOGRAPHY (ERCP);  Surgeon: Gatha Mayer, MD;  Location: [facility] ENDOSCOPY;  Service: Endoscopy;  Laterality: N/A;  talked to tiffany from or /ebp  . ESOPHAGOGASTRODUODENOSCOPY N/A 02/28/2014   Procedure: ESOPHAGOGASTRODUODENOSCOPY (EGD);  Surgeon: Gatha Mayer, MD;  Location: [facility] ENDOSCOPY;  Service: Endoscopy;  Laterality: N/A;  . VENTRICULOPERITONEAL SHUNT     right occipital     OB History   No obstetric history on file.      Home Medications    Prior to Admission medications   Medication Sig Start Date End Date Taking? Authorizing Provider  acetaminophen (TYLENOL) 325 MG tablet Take 650 mg by mouth 2 (two) times daily.    [provider]  amLODipine (NORVASC) 5 MG tablet Take 1 tablet (5 mg total) by mouth daily. 07/25/18 07/25/19  Shelly Coss, MD  ARIPiprazole (ABILIFY) 10 MG tablet Take 10 mg by mouth daily.    [provider]  atorvastatin (LIPITOR) 40 MG tablet Take 1 tablet (40 mg total) by mouth daily. 07/04/18   Janith Lima, MD  clonazePAM (KLONOPIN) 0.5 MG tablet Take 0.5 mg by mouth 3 (three) times daily.    [provider]   levocetirizine (XYZAL) 5 MG tablet Take 1 tablet (5 mg total) by mouth every evening. 10/24/18   Janith Lima, MD  lidocaine (LIDODERM) 5 % Place 1 patch onto the skin daily. Remove & Discard patch within 12 hours or as directed by MD 04/16/19   Janith Lima, MD  mirabegron ER (MYRBETRIQ) 25 MG TB24 tablet Take 1 tablet (25 mg total) by mouth daily. 04/24/19   Janith Lima, MD  pantoprazole (PROTONIX) 40 MG tablet Take 1 tablet (40 mg total) by mouth daily. 10/26/17   Janith Lima, MD  prazosin (MINIPRESS) 2 MG capsule Take 2 mg by mouth at bedtime.    [provider]  sertraline (ZOLOFT) 50 MG tablet Take 50 mg by mouth daily.    [provider]  sitaGLIPtin (JANUVIA) 100 MG tablet Take 1 tablet (100 mg total) by mouth daily. 10/24/18   Janith Lima, MD  solifenacin (VESICARE) 5 MG tablet Take 1 tablet (5 mg total) by mouth daily. 02/27/19   Janith Lima, MD  SUMAtriptan (IMITREX) 50 MG tablet Take 1 tablet (50 mg total) by mouth every 2 (two) hours as needed for migraine. May repeat in 2 hours if headache persists or recurs. 12/06/18   Janith Lima, MD  tiotropium (SPIRIVA) 18 MCG inhalation capsule Place 18 mcg into inhaler and inhale daily.    [provider]    Family History Family History  Problem Relation Age of Onset  . Heart disease Mother   . Stroke Mother   . Hypertension Mother   . Alcohol abuse Father   . Diabetes Father   . Cancer Neg Hx     Social History Social History   Tobacco Use  . Smoking status: Never Smoker  . Smokeless tobacco: Never Used  Substance Use Topics  . Alcohol use: No  . Drug use: No     Allergies   Metformin and related, Ampicillin, Ace inhibitors, Erythromycin, Penicillins, and Topamax [topiramate]   Review of Systems Review of Systems  All other systems reviewed and are negative.    Physical Exam Updated Vital Signs BP (!) 144/92 (BP Location: Left Arm)   Pulse (!) 112   Temp 98.1 F (36.7  C) (Oral)   Resp 17   SpO2 97%   Physical Exam Vitals signs and nursing note reviewed.  Constitutional:      General: She is not in acute distress.    Appearance: She is well-developed.  HENT:     Head: Normocephalic and atraumatic.  Eyes:     Conjunctiva/sclera: Conjunctivae normal.  Neck:     Musculoskeletal: Neck supple.  Cardiovascular:     Rate and Rhythm: Normal rate and regular rhythm.     Heart sounds: No murmur.     Comments: Intact distal pulses Pulmonary:     Effort: Pulmonary effort is normal. No respiratory distress.     Breath sounds: Normal breath sounds.  Abdominal:     Palpations: Abdomen is soft.  Tenderness: There is no abdominal tenderness.  Musculoskeletal: Normal range of motion.     Comments: Full PROM of left shoulder No bony deformity or abnormality  Skin:    General: Skin is warm and dry.  Neurological:     Mental Status: She is alert.     Comments: alert  Psychiatric:        Mood and Affect: Mood normal.        Behavior: Behavior normal.      ED Treatments / Results  Labs (all labs ordered are listed, but only abnormal results are displayed) Labs Reviewed - No data to display  EKG None  Radiology No results found.  Procedures Procedures (including critical care time)  Medications Ordered in ED Medications - No data to display   Initial Impression / Assessment and Plan / ED Course  I have reviewed the triage vital signs and the nursing notes.  Pertinent labs & imaging results that were available during my care of the patient were reviewed by me and considered in my medical decision making (see chart for details).        Patient with fall from bed.  Complains of left shoulder and left neck pain.   Will check imaging and reassess.  Patient seen by and discussed with Dr. Randal Buba, who agrees with plan.  Patient signed out to [place], [place], who will continue care.    Final Clinical Impressions(s) / ED Diagnoses    Final diagnoses:  None    ED Discharge Orders    None       Montine Circle, PA-C 05/16/19 [id number]    Palumbo, April, MD 05/16/19 [id number]

## 2019-05-16 NOTE — ED Notes (Signed)
[facility] notified of need for transport. 

## 2019-05-16 NOTE — ED Notes (Signed)
Pt provided with breakfast tray.

## 2019-05-16 NOTE — ED Notes (Signed)
Report given to Delcie Roch, Therapist, sports at [facility] living facility.

## 2019-05-16 NOTE — ED Provider Notes (Signed)
Care assumed from [place], PA-C at shift change with XRs pending.   In brief, this patient is a 73 y.o. with PMH/o HTN, DM, frequent falls who presents for evaluation of fall from bed.  She reports that since then, she has had some left-sided shoulder and neck pain.  She does not recall specifically how she fell but does report frequent fall secondary to hydrocephalus.  No numbness/weakness.  Please see note from previous ED provider for full history/physical exam.     Physical Exam  BP (!) 144/92 (BP Location: Left Arm)   Pulse (!) 112   Temp 98.1 F (36.7 C) (Oral)   Resp 17   SpO2 97%   Physical Exam  Alert and oriented.  Answers questions appropriately. Tenderness palpation noted to the left shoulder.  No deformity or crepitus noted.  Good range of motion.  ED Course/Procedures     Procedures  MDM   PLAN: Patient pending x-rays.  If unremarkable, plan to discharge home.  MDM:  CT head negative for any acute cervical fracture.  VP shunt with increased ventricular volume when compared to December 2018.  No acute abnormalities noted in CT head.  X-ray of left shoulder negative for any acute bony abnormality.  Discussed results with patient.  I discussed with her regarding her shunt.  She reports she just followed up with her doctor about 2 weeks ago and that there was no acute abnormalities.  Encourage follow-up with him. At this time, patient exhibits no emergent life-threatening condition that require further evaluation in ED or admission. Patient had ample opportunity for questions and discussion. All patient's questions were answered with full understanding. Strict return precautions discussed. Patient expresses understanding and agreement to plan.  1. Fall, initial encounter   2. Acute pain of left shoulder     '       [name] 05/16/19 [place], [place], DO 05/16/19 [phone]

## 2019-06-12 ENCOUNTER — Encounter: Payer: Self-pay | Admitting: Internal Medicine

## 2019-06-12 DIAGNOSIS — N3281 Overactive bladder: Secondary | ICD-10-CM

## 2019-07-11 LAB — HM DIABETES EYE EXAM

## 2019-08-02 ENCOUNTER — Ambulatory Visit
Admission: RE | Admit: 2019-08-02 | Discharge: 2019-08-02 | Disposition: A | Discharge status: Home / Self Care | Payer: Medicare Other | Source: Ambulatory Visit | Attending: Internal Medicine | Admitting: Internal Medicine

## 2019-08-02 ENCOUNTER — Other Ambulatory Visit: Payer: Self-pay

## 2019-08-02 DIAGNOSIS — Z1231 Encounter for screening mammogram for malignant neoplasm of breast: Secondary | ICD-10-CM

## 2019-08-06 LAB — HM MAMMOGRAPHY

## 2019-08-24 DIAGNOSIS — Z03818 Encounter for observation for suspected exposure to other biological agents ruled out: Secondary | ICD-10-CM | POA: Diagnosis not present

## 2019-09-02 DIAGNOSIS — Z03818 Encounter for observation for suspected exposure to other biological agents ruled out: Secondary | ICD-10-CM | POA: Diagnosis not present

## 2019-09-17 ENCOUNTER — Encounter: Payer: Self-pay | Admitting: Podiatry

## 2019-09-17 ENCOUNTER — Other Ambulatory Visit: Payer: Self-pay

## 2019-09-17 ENCOUNTER — Ambulatory Visit: Payer: Medicare PPO | Admitting: Podiatry

## 2019-09-17 DIAGNOSIS — E119 Type 2 diabetes mellitus without complications: Secondary | ICD-10-CM

## 2019-09-17 DIAGNOSIS — B351 Tinea unguium: Secondary | ICD-10-CM

## 2019-09-17 DIAGNOSIS — M79674 Pain in right toe(s): Secondary | ICD-10-CM | POA: Diagnosis not present

## 2019-09-17 DIAGNOSIS — M79675 Pain in left toe(s): Secondary | ICD-10-CM

## 2019-09-17 NOTE — Progress Notes (Signed)
Complaint:  Visit Type: Patient returns to my office for continued preventative foot care services. Complaint: Patient states" my  big nails have grown long and thick and become painful to walk and wear shoes" Patient has been diagnosed with DM with no foot complications. The patient presents for preventative foot care services. No changes to ROS  Podiatric Exam: Vascular: dorsalis pedis and posterior tibial pulses are palpable bilateral. Capillary return is immediate. Temperature gradient is WNL. Skin turgor WNL  Sensorium: Normal Semmes Weinstein monofilament test. Normal tactile sensation bilaterally. Nail Exam: Pt has thick disfigured discolored nails with subungual debris noted bilateral entire nail hallux through fifth toenails Ulcer Exam: There is no evidence of ulcer or pre-ulcerative changes or infection. Orthopedic Exam: Muscle tone and strength are WNL. No limitations in general ROM. No crepitus or effusions noted. Foot type and digits show no abnormalities. Bony prominences are unremarkable. Skin: No Porokeratosis. No infection or ulcers  Diagnosis:  Onychomycosis, , Pain in right toe, pain in left toes  Treatment & Plan Procedures and Treatment: Consent by patient was obtained for treatment procedures.   Debridement of mycotic and hypertrophic toenails, 1 through 5 bilateral and clearing of subungual debris. No ulceration, no infection noted.  Return Visit-Office Procedure: Patient instructed to return to the office for a follow up visit 3  months for continued evaluation and treatment.    Gardiner Barefoot DPM

## 2019-10-31 ENCOUNTER — Other Ambulatory Visit: Payer: Self-pay | Admitting: Internal Medicine

## 2019-10-31 ENCOUNTER — Telehealth: Payer: Self-pay | Admitting: Internal Medicine

## 2019-10-31 DIAGNOSIS — F411 Generalized anxiety disorder: Secondary | ICD-10-CM

## 2019-10-31 MED ORDER — LORAZEPAM 1 MG PO TABS: 1.0000 mg | ORAL_TABLET | Freq: Three times a day (TID) | ORAL | 3 refills | Status: DC | PRN

## 2019-10-31 NOTE — Telephone Encounter (Signed)
New Message:   1.Medication Requested: LORazepam (ATIVAN) 1 MG tablet 2. Pharmacy (Name, Street, [place]): [place], [place] - [address] 3. On Med List: Yes  4. Last Visit with PCP: 04/2019  5. Next visit date with PCP:   Agent: Please be advised that RX refills may take up to 3 business days. We ask that you follow-up with your pharmacy.

## 2019-11-01 NOTE — Telephone Encounter (Signed)
NOV : 11/06/2019

## 2019-11-06 ENCOUNTER — Ambulatory Visit: Payer: Medicare Other | Admitting: Internal Medicine

## 2019-11-06 DIAGNOSIS — Z0289 Encounter for other administrative examinations: Secondary | ICD-10-CM

## 2019-11-14 ENCOUNTER — Telehealth: Payer: Self-pay | Admitting: Internal Medicine

## 2019-11-14 ENCOUNTER — Telehealth: Payer: Self-pay

## 2019-11-14 NOTE — Telephone Encounter (Signed)
New message:   1.Medication Requested: zolpidem (AMBIEN) 5 MG tablet 2. Pharmacy (Name, Street, [place]): [place], [place] - [address] 3. On Med List: Yes  4. Last Visit with PCP: 04/24/19  5. Next visit date with PCP:11/15/19   Agent: Please be advised that RX refills may take up to 3 business days. We ask that you follow-up with your pharmacy.

## 2019-11-14 NOTE — Telephone Encounter (Signed)
Horris Latino called back and wanted to let us know that the [facility] was sent to [place] by mistake

## 2019-11-14 NOTE — Telephone Encounter (Signed)
New message   POA Adria Devon called asking the CMA to call her to discuss the patient   No details were given.

## 2019-11-14 NOTE — Telephone Encounter (Signed)
LVM for pt to call back as soon as possible.   

## 2019-11-15 ENCOUNTER — Ambulatory Visit: Payer: Medicare PPO | Admitting: Internal Medicine

## 2019-12-03 ENCOUNTER — Telehealth: Payer: Self-pay | Admitting: Internal Medicine

## 2019-12-03 ENCOUNTER — Telehealth: Payer: Self-pay

## 2019-12-03 DIAGNOSIS — H6123 Impacted cerumen, bilateral: Secondary | ICD-10-CM

## 2019-12-03 NOTE — Telephone Encounter (Signed)
New message   [name] calling needs a Referral to ENT   Specialist : Geriatric ENT   Reason : excessive amount of mucus & ear wax impossible to hear

## 2019-12-03 NOTE — Telephone Encounter (Signed)
    Patient calling to report productive cough x1 week and some vomiting. No fever Lives at [facility] a virtual virtual visit with other providers Seeking advice

## 2019-12-03 NOTE — Telephone Encounter (Signed)
Referral has been entered. ENT will call Horris Latino directly to schedule.

## 2019-12-04 NOTE — Telephone Encounter (Signed)
Pt contacted and stated that she has had some respiratory and digestive issues for the last week. Pt did not want to see any other provider than Dr. Ronnald Ramp.   Pt has been scheduled out to the 20th of May.

## 2019-12-18 ENCOUNTER — Telehealth: Payer: Self-pay

## 2019-12-18 NOTE — Telephone Encounter (Signed)
Called [place] back. Informed that there should be a "legal guardian" flag on the patients chart. I feel that there was one at one time or another.   Also spoke about the Vesicare and Horris Latino wanted to know if there were ever issues with that medication. Informed Horris Latino that I did not have any record that indicated there was an issue.   Horris Latino stated understanding and will let me know if there are any other concerns or needs.

## 2019-12-18 NOTE — Telephone Encounter (Signed)
New message    POA [name] calling  Appt cancel on  5.19.21 by the patient   [name] needs to talk with the [facility] [place] regarding the patient canceling her appt without her permission.

## 2019-12-19 ENCOUNTER — Ambulatory Visit: Payer: Medicare PPO | Admitting: Internal Medicine

## 2019-12-20 ENCOUNTER — Ambulatory Visit: Payer: Medicare PPO | Admitting: Internal Medicine

## 2019-12-26 ENCOUNTER — Encounter: Payer: Self-pay | Admitting: Internal Medicine

## 2019-12-27 ENCOUNTER — Encounter: Payer: Self-pay | Admitting: Internal Medicine

## 2019-12-27 ENCOUNTER — Ambulatory Visit (INDEPENDENT_AMBULATORY_CARE_PROVIDER_SITE_OTHER): Payer: Medicare PPO | Admitting: Internal Medicine

## 2019-12-27 VITALS — BP 132/72 | HR 96 | Temp 98.7°F | Resp 16 | Ht 65.0 in | Wt 190.5 lb

## 2019-12-27 DIAGNOSIS — N3001 Acute cystitis with hematuria: Secondary | ICD-10-CM | POA: Insufficient documentation

## 2019-12-27 LAB — POC URINALSYSI DIPSTICK (AUTOMATED)
Bilirubin, UA: POSITIVE
Blood, UA: POSITIVE
Glucose, UA: NEGATIVE
Ketones, UA: NEGATIVE
Protein, UA: POSITIVE — AB
Spec Grav, UA: 1.025 (ref 1.010–1.025)
Urobilinogen, UA: 0.2 E.U./dL
pH, UA: 6 (ref 5.0–8.0)

## 2019-12-27 MED ORDER — NITROFURANTOIN MONOHYD MACRO 100 MG PO CAPS: 100.0000 mg | ORAL_CAPSULE | Freq: Two times a day (BID) | ORAL | 0 refills | Status: DC

## 2019-12-27 NOTE — Patient Instructions (Signed)

## 2019-12-27 NOTE — Progress Notes (Signed)
Subjective:  Patient ID: [name], female    DOB: 06/09/1946  Age: 74 y.o. MRN: [id number]  CC: Urinary Tract Infection  This visit occurred during the SARS-CoV-2 public health emergency.  Safety protocols were in place, including screening questions prior to the visit, additional usage of staff PPE, and extensive cleaning of exam room while observing appropriate contact time as indicated for disinfecting solutions.    HPI [name] presents for a 3-day history of dysuria, bladder pressure, frequency, and urgency.  Outpatient Medications Prior to Visit  Medication Sig Dispense Refill  . acetaminophen (TYLENOL) 325 MG tablet Take 650 mg by mouth 2 (two) times daily.    . ARIPiprazole (ABILIFY) 10 MG tablet Take 5 mg by mouth daily.     Marland Kitchen atorvastatin (LIPITOR) 40 MG tablet Take 1 tablet (40 mg total) by mouth daily. 90 tablet 1  . desvenlafaxine (PRISTIQ) 100 MG 24 hr tablet Take 100 mg by mouth daily.    Marland Kitchen levocetirizine (XYZAL) 5 MG tablet Take 1 tablet (5 mg total) by mouth every evening. 90 tablet 1  . lidocaine (LIDODERM) 5 % Place 1 patch onto the skin daily. Remove & Discard patch within 12 hours or as directed by MD 30 patch 5  . LORazepam (ATIVAN) 1 MG tablet Take 1 tablet (1 mg total) by mouth every 8 (eight) hours as needed for anxiety. 90 tablet 3  . mirabegron ER (MYRBETRIQ) 25 MG TB24 tablet Take 1 tablet (25 mg total) by mouth daily. 90 tablet 1  . pantoprazole (PROTONIX) 40 MG tablet Take 1 tablet (40 mg total) by mouth daily. 90 tablet 3  . prazosin (MINIPRESS) 2 MG capsule Take 2 mg by mouth at bedtime.    . sitaGLIPtin (JANUVIA) 100 MG tablet Take 1 tablet (100 mg total) by mouth daily. 90 tablet 1  . solifenacin (VESICARE) 5 MG tablet Take 1 tablet (5 mg total) by mouth daily. 90 tablet 1  . SUMAtriptan (IMITREX) 50 MG tablet Take 1 tablet (50 mg total) by mouth every 2 (two) hours as needed for migraine. May repeat in 2 hours if headache persists or  recurs. 10 tablet 2  . tiotropium (SPIRIVA) 18 MCG inhalation capsule Place 18 mcg into inhaler and inhale daily.    Marland Kitchen zolpidem (AMBIEN) 5 MG tablet Take 5 mg by mouth at bedtime as needed for sleep.    Marland Kitchen amLODipine (NORVASC) 5 MG tablet Take 1 tablet (5 mg total) by mouth daily. 30 tablet 11   No facility-administered medications prior to visit.    ROS Review of Systems  Constitutional: Negative for chills, fatigue and fever.  HENT: Negative.   Eyes: Negative for visual disturbance.  Respiratory: Negative for cough, chest tightness, shortness of breath and wheezing.   Cardiovascular: Negative for chest pain, palpitations and leg swelling.  Gastrointestinal: Negative for abdominal pain, diarrhea, nausea and vomiting.  Endocrine: Negative.   Genitourinary: Positive for dysuria, frequency, pelvic pain and urgency. Negative for decreased urine volume, difficulty urinating, flank pain, hematuria, vaginal bleeding and vaginal discharge.  Musculoskeletal: Negative.   Skin: Negative.   Neurological: Negative.  Negative for dizziness, weakness and light-headedness.  Hematological: Negative for adenopathy. Does not bruise/bleed easily.  Psychiatric/Behavioral: Negative.     Objective:  BP 132/72 (BP Location: Left Arm, Patient Position: Sitting, Cuff Size: Large)   Pulse 96   Temp 98.7 F (37.1 C) (Oral)   Resp 16   Ht 5\' 5"  (1.651 m)   Wt  190 lb 8 oz (86.4 kg)   SpO2 96%   BMI 31.70 kg/m   BP Readings from Last 3 Encounters:  12/27/19 132/72  05/16/19 120/83  04/24/19 120/78    Wt Readings from Last 3 Encounters:  12/27/19 190 lb 8 oz (86.4 kg)  04/24/19 207 lb (93.9 kg)  09/27/18 207 lb (93.9 kg)    Physical Exam Vitals reviewed.  Constitutional:      Appearance: Normal appearance.  HENT:     Mouth/Throat:     Mouth: Mucous membranes are moist.  Eyes:     Conjunctiva/sclera: Conjunctivae normal.  Cardiovascular:     Rate and Rhythm: Normal rate and regular rhythm.      Heart sounds: No murmur.  Pulmonary:     Effort: Pulmonary effort is normal.     Breath sounds: No stridor. No wheezing, rhonchi or rales.  Abdominal:     General: Abdomen is protuberant. Bowel sounds are normal. There is no distension.     Palpations: Abdomen is soft. There is no hepatomegaly, splenomegaly or mass.     Tenderness: There is no abdominal tenderness.  Musculoskeletal:        General: Normal range of motion.     Cervical back: Neck supple.     Right lower leg: No edema.     Left lower leg: No edema.  Lymphadenopathy:     Cervical: No cervical adenopathy.  Skin:    General: Skin is warm and dry.     Coloration: Skin is not pale.  Neurological:     General: No focal deficit present.     Mental Status: She is alert.  Psychiatric:        Mood and Affect: Mood normal.        Behavior: Behavior normal.     Lab Results  Component Value Date   WBC 6.1 09/16/2018   HGB 14.9 09/16/2018   HCT 45.3 09/16/2018   PLT 194 09/16/2018   GLUCOSE 111 (H) 09/16/2018   CHOL 257 (H) 07/04/2018   TRIG 293.0 (H) 07/04/2018   HDL 60.60 07/04/2018   LDLDIRECT 154.0 07/04/2018   LDLCALC 124 (H) 05/11/2016   ALT 13 07/22/2018   AST 18 07/22/2018   NA 139 09/16/2018   K 3.6 09/16/2018   CL 103 09/16/2018   CREATININE 0.78 09/16/2018   BUN 15 09/16/2018   CO2 27 09/16/2018   TSH 1.01 06/07/2018   INR 1.13 07/20/2018   HGBA1C 6.1 (A) 09/27/2018   MICROALBUR 2.3 (H) 06/07/2018    MM 3D SCREEN BREAST BILATERAL  Result Date: 08/06/2019 CLINICAL DATA:  Screening. EXAM: DIGITAL SCREENING BILATERAL MAMMOGRAM WITH TOMO AND CAD COMPARISON:  Previous exam(s). ACR Breast Density Category b: There are scattered areas of fibroglandular density. FINDINGS: There are no findings suspicious for malignancy. Images were processed with CAD. IMPRESSION: No mammographic evidence of malignancy. A result letter of this screening mammogram will be mailed directly to the patient. RECOMMENDATION:  Screening mammogram in one year. (Code:SM-B-01Y) BI-RADS CATEGORY  1: Negative. Electronically Signed   By: Margarette Canada M.D.   On: 08/06/2019 08:01   Results for MARILYNE, TIENDA (MRN [id number]) as of 12/28/2019 18:00  Ref. Range 12/27/2019 13:58  Bilirubin, UA Unknown POSITIVE  Clarity, UA Unknown cloudy  Color, UA Unknown orange  Glucose Latest Ref Range: Negative  Negative  Ketones, UA Unknown Negative  Leukocytes,UA Latest Ref Range: Negative  Large (3+) (A)  Nitrite, UA Unknown POSITVIE  pH, UA Latest Ref  Range: 5.0 - 8.0  6.0  Protein,UA Latest Ref Range: Negative  Positive (A)  Specific Gravity, UA Latest Ref Range: 1.010 - 1.025  1.025  Urobilinogen, UA Latest Ref Range: 0.2 or 1.0 E.U./dL 0.2  RBC, UA Unknown POSITIVE    Assessment & Plan:   Ece was seen today for urinary tract infection.  Diagnoses and all orders for this visit:  Acute cystitis with hematuria- Her UA is positive for leukocytes, nitrites, and red blood cells.  The culture is pending.  Will empirically treat with nitrofurantoin. -     nitrofurantoin, macrocrystal-monohydrate, (MACROBID) 100 MG capsule; Take 1 capsule (100 mg total) by mouth 2 (two) times daily for 5 days. -     POCT Urinalysis Dipstick (Automated) -     CULTURE, URINE COMPREHENSIVE; Future -     CULTURE, URINE COMPREHENSIVE   I am having [name] start on nitrofurantoin (macrocrystal-monohydrate). I am also having her maintain her pantoprazole, atorvastatin, amLODipine, acetaminophen, ARIPiprazole, tiotropium, prazosin, sitaGLIPtin, levocetirizine, SUMAtriptan, solifenacin, lidocaine, mirabegron ER, zolpidem, desvenlafaxine, and LORazepam.  Meds ordered this encounter  Medications  . nitrofurantoin, macrocrystal-monohydrate, (MACROBID) 100 MG capsule    Sig: Take 1 capsule (100 mg total) by mouth 2 (two) times daily for 5 days.    Dispense:  10 capsule    Refill:  0     Follow-up: Return in about 2 weeks (around  01/10/2020).  [name], MD

## 2019-12-30 LAB — CULTURE, URINE COMPREHENSIVE

## 2020-01-01 ENCOUNTER — Encounter: Payer: Self-pay | Admitting: Internal Medicine

## 2020-01-01 ENCOUNTER — Other Ambulatory Visit: Payer: Self-pay | Admitting: Internal Medicine

## 2020-01-01 DIAGNOSIS — N390 Urinary tract infection, site not specified: Secondary | ICD-10-CM

## 2020-01-01 DIAGNOSIS — B9689 Other specified bacterial agents as the cause of diseases classified elsewhere: Secondary | ICD-10-CM

## 2020-01-01 MED ORDER — SULFAMETHOXAZOLE-TRIMETHOPRIM 800-160 MG PO TABS: 1.0000 | ORAL_TABLET | Freq: Two times a day (BID) | ORAL | 0 refills | Status: AC

## 2020-01-10 ENCOUNTER — Ambulatory Visit (INDEPENDENT_AMBULATORY_CARE_PROVIDER_SITE_OTHER): Payer: Medicare PPO | Admitting: Internal Medicine

## 2020-01-10 ENCOUNTER — Encounter: Payer: Self-pay | Admitting: Internal Medicine

## 2020-01-10 ENCOUNTER — Ambulatory Visit (INDEPENDENT_AMBULATORY_CARE_PROVIDER_SITE_OTHER): Payer: Medicare PPO

## 2020-01-10 ENCOUNTER — Other Ambulatory Visit: Payer: Self-pay

## 2020-01-10 VITALS — BP 136/80 | HR 103 | Temp 98.4°F | Ht 65.0 in | Wt 186.5 lb

## 2020-01-10 VITALS — BP 136/80 | HR 103 | Ht 65.0 in | Wt 186.5 lb

## 2020-01-10 DIAGNOSIS — E118 Type 2 diabetes mellitus with unspecified complications: Secondary | ICD-10-CM

## 2020-01-10 DIAGNOSIS — F333 Major depressive disorder, recurrent, severe with psychotic symptoms: Secondary | ICD-10-CM

## 2020-01-10 DIAGNOSIS — E785 Hyperlipidemia, unspecified: Secondary | ICD-10-CM

## 2020-01-10 DIAGNOSIS — D510 Vitamin B12 deficiency anemia due to intrinsic factor deficiency: Secondary | ICD-10-CM

## 2020-01-10 DIAGNOSIS — Z0000 Encounter for general adult medical examination without abnormal findings: Secondary | ICD-10-CM

## 2020-01-10 DIAGNOSIS — K7581 Nonalcoholic steatohepatitis (NASH): Secondary | ICD-10-CM

## 2020-01-10 DIAGNOSIS — N390 Urinary tract infection, site not specified: Secondary | ICD-10-CM | POA: Diagnosis not present

## 2020-01-10 DIAGNOSIS — N3001 Acute cystitis with hematuria: Secondary | ICD-10-CM | POA: Diagnosis not present

## 2020-01-10 DIAGNOSIS — M5412 Radiculopathy, cervical region: Secondary | ICD-10-CM | POA: Insufficient documentation

## 2020-01-10 DIAGNOSIS — B9689 Other specified bacterial agents as the cause of diseases classified elsewhere: Secondary | ICD-10-CM

## 2020-01-10 DIAGNOSIS — E559 Vitamin D deficiency, unspecified: Secondary | ICD-10-CM

## 2020-01-10 DIAGNOSIS — I10 Essential (primary) hypertension: Secondary | ICD-10-CM | POA: Diagnosis not present

## 2020-01-10 DIAGNOSIS — R7989 Other specified abnormal findings of blood chemistry: Secondary | ICD-10-CM | POA: Insufficient documentation

## 2020-01-10 LAB — TSH: TSH: 1.84 u[IU]/mL (ref 0.35–4.50)

## 2020-01-10 LAB — LIPID PANEL
Cholesterol: 135 mg/dL (ref 0–200)
HDL: 47 mg/dL (ref >39.00)
LDL Cholesterol: 61 mg/dL (ref 0–99)
NonHDL: 88.05
Total CHOL/HDL Ratio: 3
Triglycerides: 135 mg/dL (ref 0.0–149.0)
VLDL: 27 mg/dL (ref 0.0–40.0)

## 2020-01-10 LAB — HEPATIC FUNCTION PANEL
ALT: 49 U/L — ABNORMAL HIGH (ref 0–35)
AST: 63 U/L — ABNORMAL HIGH (ref 0–37)
Albumin: 4.1 g/dL (ref 3.5–5.2)
Alkaline Phosphatase: 211 U/L — ABNORMAL HIGH (ref 39–117)
Bilirubin, Direct: 0.3 mg/dL (ref 0.0–0.3)
Total Bilirubin: 0.7 mg/dL (ref 0.2–1.2)
Total Protein: 7.9 g/dL (ref 6.0–8.3)

## 2020-01-10 LAB — MICROALBUMIN / CREATININE URINE RATIO
Creatinine,U: 125.3 mg/dL
Microalb Creat Ratio: 1.4 mg/g (ref 0.0–30.0)
Microalb, Ur: 1.8 mg/dL (ref 0.0–1.9)

## 2020-01-10 LAB — URINALYSIS, ROUTINE W REFLEX MICROSCOPIC
Bilirubin Urine: NEGATIVE
Ketones, ur: NEGATIVE
Nitrite: NEGATIVE
Specific Gravity, Urine: 1.025 (ref 1.000–1.030)
Total Protein, Urine: NEGATIVE
Urine Glucose: NEGATIVE
Urobilinogen, UA: 0.2 (ref 0.0–1.0)
pH: 5.5 (ref 5.0–8.0)

## 2020-01-10 LAB — CBC WITH DIFFERENTIAL/PLATELET
Basophils Absolute: 0.1 10*3/uL (ref 0.0–0.1)
Basophils Relative: 2.2 % (ref 0.0–3.0)
Eosinophils Absolute: 0.2 10*3/uL (ref 0.0–0.7)
Eosinophils Relative: 4.8 % (ref 0.0–5.0)
HCT: 43.3 % (ref 36.0–46.0)
Hemoglobin: 14.7 g/dL (ref 12.0–15.0)
Lymphocytes Relative: 25.7 % (ref 12.0–46.0)
Lymphs Abs: 1.3 10*3/uL (ref 0.7–4.0)
MCHC: 33.9 g/dL (ref 30.0–36.0)
MCV: 92.3 fl (ref 78.0–100.0)
Monocytes Absolute: 0.3 10*3/uL (ref 0.1–1.0)
Monocytes Relative: 6.5 % (ref 3.0–12.0)
Neutro Abs: 3.1 10*3/uL (ref 1.4–7.7)
Neutrophils Relative %: 60.8 % (ref 43.0–77.0)
Platelets: 195 10*3/uL (ref 150.0–400.0)
RBC: 4.69 Mil/uL (ref 3.87–5.11)
RDW: 12.9 % (ref 11.5–15.5)
WBC: 5 10*3/uL (ref 4.0–10.5)

## 2020-01-10 LAB — BASIC METABOLIC PANEL
BUN: 22 mg/dL (ref 6–23)
CO2: 29 mEq/L (ref 19–32)
Calcium: 9.3 mg/dL (ref 8.4–10.5)
Chloride: 102 mEq/L (ref 96–112)
Creatinine, Ser: 0.76 mg/dL (ref 0.40–1.20)
GFR: 74.62 mL/min (ref >60.00)
Glucose, Bld: 137 mg/dL — ABNORMAL HIGH (ref 70–99)
Potassium: 4.1 mEq/L (ref 3.5–5.1)
Sodium: 138 mEq/L (ref 135–145)

## 2020-01-10 LAB — POCT GLYCOSYLATED HEMOGLOBIN (HGB A1C)
HbA1c POC (<> result, manual entry): 6.3 % (ref 4.0–5.6)
Hemoglobin A1C: 6.3 % — AB (ref 4.0–5.6)

## 2020-01-10 LAB — VITAMIN D 25 HYDROXY (VIT D DEFICIENCY, FRACTURES): VITD: 15.2 ng/mL — ABNORMAL LOW (ref 30.00–100.00)

## 2020-01-10 LAB — FOLATE: Folate: 9 ng/mL (ref >5.9)

## 2020-01-10 LAB — VITAMIN B12: Vitamin B-12: 379 pg/mL (ref 211–911)

## 2020-01-10 MED ORDER — CHOLECALCIFEROL 125 MCG (5000 UT) PO CAPS: 5000.0000 [IU] | ORAL_CAPSULE | Freq: Every day | ORAL | 1 refills | Status: DC

## 2020-01-10 MED ORDER — PIOGLITAZONE HCL 15 MG PO TABS: 15.0000 mg | ORAL_TABLET | Freq: Every day | ORAL | 1 refills | Status: DC

## 2020-01-10 MED ORDER — ATORVASTATIN CALCIUM 40 MG PO TABS: 40.0000 mg | ORAL_TABLET | Freq: Every day | ORAL | 1 refills | Status: AC

## 2020-01-10 NOTE — Patient Instructions (Signed)
Health Maintenance, Female Adopting a healthy lifestyle and getting preventive care are important in promoting health and wellness. Ask your health care provider about:  The right schedule for you to have regular tests and exams.  Things you can do on your own to prevent diseases and keep yourself healthy. What should I know about diet, weight, and exercise? Eat a healthy diet   Eat a diet that includes plenty of vegetables, fruits, low-fat dairy products, and lean protein.  Do not eat a lot of foods that are high in solid fats, added sugars, or sodium. Maintain a healthy weight Body mass index (BMI) is used to identify weight problems. It estimates body fat based on height and weight. Your health care provider can help determine your BMI and help you achieve or maintain a healthy weight. Get regular exercise Get regular exercise. This is one of the most important things you can do for your health. Most adults should:  Exercise for at least 150 minutes each week. The exercise should increase your heart rate and make you sweat (moderate-intensity exercise).  Do strengthening exercises at least twice a week. This is in addition to the moderate-intensity exercise.  Spend less time sitting. Even light physical activity can be beneficial. Watch cholesterol and blood lipids Have your blood tested for lipids and cholesterol at 74 years of age, then have this test every 5 years. Have your cholesterol levels checked more often if:  Your lipid or cholesterol levels are high.  You are older than 73 years of age.  You are at high risk for heart disease. What should I know about cancer screening? Depending on your health history and family history, you may need to have cancer screening at various ages. This may include screening for:  Breast cancer.  Cervical cancer.  Colorectal cancer.  Skin cancer.  Lung cancer. What should I know about heart disease, diabetes, and high blood  pressure? Blood pressure and heart disease  High blood pressure causes heart disease and increases the risk of stroke. This is more likely to develop in people who have high blood pressure readings, are of African descent, or are overweight.  Have your blood pressure checked: ? Every 3-5 years if you are 18-39 years of age. ? Every year if you are 40 years old or older. Diabetes Have regular diabetes screenings. This checks your fasting blood sugar level. Have the screening done:  Once every three years after age 40 if you are at a normal weight and have a low risk for diabetes.  More often and at a younger age if you are overweight or have a high risk for diabetes. What should I know about preventing infection? Hepatitis B If you have a higher risk for hepatitis B, you should be screened for this virus. Talk with your health care provider to find out if you are at risk for hepatitis B infection. Hepatitis C Testing is recommended for:  Everyone born from 1945 through 1965.  Anyone with known risk factors for hepatitis C. Sexually transmitted infections (STIs)  Get screened for STIs, including gonorrhea and chlamydia, if: ? You are sexually active and are younger than 73 years of age. ? You are older than 73 years of age and your health care provider tells you that you are at risk for this type of infection. ? Your sexual activity has changed since you were last screened, and you are at increased risk for chlamydia or gonorrhea. Ask your health care provider if   you are at risk.  Ask your health care provider about whether you are at high risk for HIV. Your health care provider may recommend a prescription medicine to help prevent HIV infection. If you choose to take medicine to prevent HIV, you should first get tested for HIV. You should then be tested every 3 months for as long as you are taking the medicine. Pregnancy  If you are about to stop having your period (premenopausal) and  you may become pregnant, seek counseling before you get pregnant.  Take 400 to 800 micrograms (mcg) of folic acid every day if you become pregnant.  Ask for birth control (contraception) if you want to prevent pregnancy. Osteoporosis and menopause Osteoporosis is a disease in which the bones lose minerals and strength with aging. This can result in bone fractures. If you are 65 years old or older, or if you are at risk for osteoporosis and fractures, ask your health care provider if you should:  Be screened for bone loss.  Take a calcium or vitamin D supplement to lower your risk of fractures.  Be given hormone replacement therapy (HRT) to treat symptoms of menopause. Follow these instructions at home: Lifestyle  Do not use any products that contain nicotine or tobacco, such as cigarettes, e-cigarettes, and chewing tobacco. If you need help quitting, ask your health care provider.  Do not use street drugs.  Do not share needles.  Ask your health care provider for help if you need support or information about quitting drugs. Alcohol use  Do not drink alcohol if: ? Your health care provider tells you not to drink. ? You are pregnant, may be pregnant, or are planning to become pregnant.  If you drink alcohol: ? Limit how much you use to 0-1 drink a day. ? Limit intake if you are breastfeeding.  Be aware of how much alcohol is in your drink. In the [place] one drink equals one 12 oz bottle of beer (355 mL), one 5 oz glass of wine (148 mL), or one 1 oz glass of hard liquor (44 mL). General instructions  Schedule regular health, dental, and eye exams.  Stay current with your vaccines.  Tell your health care provider if: ? You often feel depressed. ? You have ever been abused or do not feel safe at home. Summary  Adopting a healthy lifestyle and getting preventive care are important in promoting health and wellness.  Follow your health care provider's instructions about healthy  diet, exercising, and getting tested or screened for diseases.  Follow your health care provider's instructions on monitoring your cholesterol and blood pressure. This information is not intended to replace advice given to you by your health care provider. Make sure you discuss any questions you have with your health care provider. Document Revised: 07/12/2018 Document Reviewed: 07/12/2018 Elsevier Patient Education  2020 [employer]  

## 2020-01-10 NOTE — Patient Instructions (Addendum)
[name] , Thank you for taking time to come for your Medicare Wellness Visit. I appreciate your ongoing commitment to your health goals. Please review the following plan we discussed and let me know if I can assist you in the future.   Screening recommendations/referrals: Colonoscopy: Cologuard done 06/22/2017 Mammogram: 08/06/2019 Bone Density: never done Recommended yearly ophthalmology/optometry visit for glaucoma screening and checkup Recommended yearly dental visit for hygiene and checkup  Vaccinations: Influenza vaccine: 04/24/2019 Pneumococcal vaccine: completed Tdap vaccine: 02/07/2018 Shingles vaccine: completed   Covid-19: completed  Advanced directives: Advance directive discussed with you today. Documents are on file.  Conditions/risks identified: Reviewed health maintenance screenings with patient today and relevant education, vaccines, and/or referrals were provided.    Continue doing brain stimulating activities (puzzles, reading, adult coloring books, staying active) to keep memory sharp.    Continue to eat heart healthy diet (full of fruits, vegetables, whole grains, lean protein, water--limit salt, fat, and sugar intake) and increase physical activity as tolerated.  Next appointment: Please schedule your next Medicare Wellness Visit in 1 year.   Preventive Care 24 Years and Older, Female Preventive care refers to lifestyle choices and visits with your health care provider that can promote health and wellness. What does preventive care include?  A yearly physical exam. This is also called an annual well check.  Dental exams once or twice a year.  Routine eye exams. Ask your health care provider how often you should have your eyes checked.  Personal lifestyle choices, including:  Daily care of your teeth and gums.  Regular physical activity.  Eating a healthy diet.  Avoiding tobacco and drug use.  Limiting alcohol use.  Practicing safe  sex.  Taking low-dose aspirin every day.  Taking vitamin and mineral supplements as recommended by your health care provider. What happens during an annual well check? The services and screenings done by your health care provider during your annual well check will depend on your age, overall health, lifestyle risk factors, and family history of disease. Counseling  Your health care provider may ask you questions about your:  Alcohol use.  Tobacco use.  Drug use.  Emotional well-being.  Home and relationship well-being.  Sexual activity.  Eating habits.  History of falls.  Memory and ability to understand (cognition).  Work and work Statistician.  Reproductive health. Screening  You may have the following tests or measurements:  Height, weight, and BMI.  Blood pressure.  Lipid and cholesterol levels. These may be checked every 5 years, or more frequently if you are over 30 years old.  Skin check.  Lung cancer screening. You may have this screening every year starting at age 40 if you have a 30-pack-year history of smoking and currently smoke or have quit within the past 15 years.  Fecal occult blood test (FOBT) of the stool. You may have this test every year starting at age 31.  Flexible sigmoidoscopy or colonoscopy. You may have a sigmoidoscopy every 5 years or a colonoscopy every 10 years starting at age 69.  Hepatitis C blood test.  Hepatitis B blood test.  Sexually transmitted disease (STD) testing.  Diabetes screening. This is done by checking your blood sugar (glucose) after you have not eaten for a while (fasting). You may have this done every 1-3 years.  Bone density scan. This is done to screen for osteoporosis. You may have this done starting at age 70.  Mammogram. This may be done every 1-2 years. Talk to your health  care provider about how often you should have regular mammograms. Talk with your health care provider about your test results,  treatment options, and if necessary, the need for more tests. Vaccines  Your health care provider may recommend certain vaccines, such as:  Influenza vaccine. This is recommended every year.  Tetanus, diphtheria, and acellular pertussis (Tdap, Td) vaccine. You may need a Td booster every 10 years.  Zoster vaccine. You may need this after age 61.  Pneumococcal 13-valent conjugate (PCV13) vaccine. One dose is recommended after age 37.  Pneumococcal polysaccharide (PPSV23) vaccine. One dose is recommended after age 59. Talk to your health care provider about which screenings and vaccines you need and how often you need them. This information is not intended to replace advice given to you by your health care provider. Make sure you discuss any questions you have with your health care provider. Document Released: 08/15/2015 Document Revised: 04/07/2016 Document Reviewed: 05/20/2015 Elsevier Interactive Patient Education  2017 [employer] Prevention in the Home Falls can cause injuries. They can happen to people of all ages. There are many things you can do to make your home safe and to help prevent falls. What can I do on the outside of my home?  Regularly fix the edges of walkways and driveways and fix any cracks.  Remove anything that might make you trip as you walk through a door, such as a raised step or threshold.  Trim any bushes or trees on the path to your home.  Use bright outdoor lighting.  Clear any walking paths of anything that might make someone trip, such as rocks or tools.  Regularly check to see if handrails are loose or broken. Make sure that both sides of any steps have handrails.  Any raised decks and porches should have guardrails on the edges.  Have any leaves, snow, or ice cleared regularly.  Use sand or salt on walking paths during winter.  Clean up any spills in your garage right away. This includes oil or grease spills. What can I do in the  bathroom?  Use night lights.  Install grab bars by the toilet and in the tub and shower. Do not use towel bars as grab bars.  Use non-skid mats or decals in the tub or shower.  If you need to sit down in the shower, use a plastic, non-slip stool.  Keep the floor dry. Clean up any water that spills on the floor as soon as it happens.  Remove soap buildup in the tub or shower regularly.  Attach bath mats securely with double-sided non-slip rug tape.  Do not have throw rugs and other things on the floor that can make you trip. What can I do in the bedroom?  Use night lights.  Make sure that you have a light by your bed that is easy to reach.  Do not use any sheets or blankets that are too big for your bed. They should not hang down onto the floor.  Have a firm chair that has side arms. You can use this for support while you get dressed.  Do not have throw rugs and other things on the floor that can make you trip. What can I do in the kitchen?  Clean up any spills right away.  Avoid walking on wet floors.  Keep items that you use a lot in easy-to-reach places.  If you need to reach something above you, use a strong step stool that has a grab  bar.  Keep electrical cords out of the way.  Do not use floor polish or wax that makes floors slippery. If you must use wax, use non-skid floor wax.  Do not have throw rugs and other things on the floor that can make you trip. What can I do with my stairs?  Do not leave any items on the stairs.  Make sure that there are handrails on both sides of the stairs and use them. Fix handrails that are broken or loose. Make sure that handrails are as long as the stairways.  Check any carpeting to make sure that it is firmly attached to the stairs. Fix any carpet that is loose or worn.  Avoid having throw rugs at the top or bottom of the stairs. If you do have throw rugs, attach them to the floor with carpet tape.  Make sure that you have a  light switch at the top of the stairs and the bottom of the stairs. If you do not have them, ask someone to add them for you. What else can I do to help prevent falls?  Wear shoes that:  Do not have high heels.  Have rubber bottoms.  Are comfortable and fit you well.  Are closed at the toe. Do not wear sandals.  If you use a stepladder:  Make sure that it is fully opened. Do not climb a closed stepladder.  Make sure that both sides of the stepladder are locked into place.  Ask someone to hold it for you, if possible.  Clearly mark and make sure that you can see:  Any grab bars or handrails.  First and last steps.  Where the edge of each step is.  Use tools that help you move around (mobility aids) if they are needed. These include:  Canes.  Walkers.  Scooters.  Crutches.  Turn on the lights when you go into a dark area. Replace any light bulbs as soon as they burn out.  Set up your furniture so you have a clear path. Avoid moving your furniture around.  If any of your floors are uneven, fix them.  If there are any pets around you, be aware of where they are.  Review your medicines with your doctor. Some medicines can make you feel dizzy. This can increase your chance of falling. Ask your doctor what other things that you can do to help prevent falls. This information is not intended to replace advice given to you by your health care provider. Make sure you discuss any questions you have with your health care provider. Document Released: 05/15/2009 Document Revised: 12/25/2015 Document Reviewed: 08/23/2014 Elsevier Interactive Patient Education  2017 [employer].

## 2020-01-10 NOTE — Progress Notes (Signed)
Subjective:   [name] is a 74 y.o. female who presents for Medicare Annual (Subsequent) preventive examination.  Review of Systems:  No ROS. Medicare Wellness Virtual Visit. Additional risk factors are reflected in social history. Cardiac Risk Factors include: advanced age (>30men, >8 women);diabetes mellitus;dyslipidemia;family history of premature cardiovascular disease;hypertension;obesity (BMI >30kg/m2)     Objective:     Vitals: BP 136/80   Pulse (!) 103   Ht 5\' 5"  (1.651 m)   Wt 186 lb 8 oz (84.6 kg)   BMI 31.04 kg/m   Body mass index is 31.04 kg/m.  Advanced Directives 01/10/2020 09/16/2018 09/16/2018 07/20/2018 07/20/2018 11/28/2017 11/28/2017  Does Patient Have a Medical Advance Directive? Yes No Yes - Yes No No  Type of Paramedic of [place];Living will Healthcare Power of [place] of [place] - -  Does patient want to make changes to medical advance directive? No - Patient declined No - Patient declined No - Patient declined No - Patient declined - - -  Copy of [place] in Chart? - No - copy requested No - copy requested - No - copy requested - -  Would patient like information on creating a medical advance directive? - No - Patient declined - - - No - Patient declined -  Pre-existing out of facility DNR order (yellow form or pink MOST form) - - - - - - -    Tobacco Social History   Tobacco Use  Smoking Status Never Smoker  Smokeless Tobacco Never Used     Counseling given: Not Answered   Clinical Intake:  Pre-visit preparation completed: Yes  Pain : 0-10 Pain Score: 3  Pain Type: Chronic pain Pain Location: Knee Pain Orientation: Left Pain Descriptors / Indicators: Discomfort Pain Onset: More than a month ago Pain Frequency: Intermittent Pain Relieving Factors: Tylenol, lidocaine patches, ice and elevation  Pain Relieving  Factors: Tylenol, lidocaine patches, ice and elevation  BMI - recorded: 31.04 Nutritional Status: BMI > 30  Obese Nutritional Risks: None Diabetes: Yes CBG done?: No Did pt. bring in CBG monitor from home?: No  What is the last grade level you completed in school?: HSG  Interpreter Needed?: No  Information entered by :: Letrell Attwood N. Lowell Guitar, LPN  Past Medical History:  Diagnosis Date  . Acid reflux disease   . Acute kidney injury ([place])   . Anxiety   . Chronic bronchitis ([place])   . Depression   . Diabetes mellitus without complication ([place])   . Dizziness   . Falls   . High cholesterol   . Hydrocephalus ([place])   . Hypertension   . Hypotension   . MVP (mitral valve prolapse)   . Sinus bradycardia   . Transaminitis    Past Surgical History:  Procedure Laterality Date  . BRAIN SURGERY    . BREAST BIOPSY Left   . CHOLECYSTECTOMY    . ERCP N/A 02/28/2014   Procedure: ENDOSCOPIC RETROGRADE CHOLANGIOPANCREATOGRAPHY (ERCP);  Surgeon: Gatha Mayer, MD;  Location: [facility] ENDOSCOPY;  Service: Endoscopy;  Laterality: N/A;  talked to tiffany from or /ebp  . ESOPHAGOGASTRODUODENOSCOPY N/A 02/28/2014   Procedure: ESOPHAGOGASTRODUODENOSCOPY (EGD);  Surgeon: Gatha Mayer, MD;  Location: [facility] ENDOSCOPY;  Service: Endoscopy;  Laterality: N/A;  . VENTRICULOPERITONEAL SHUNT     right occipital   Family History  Problem Relation Age of Onset  . Heart disease Mother   . Stroke Mother   .  Hypertension Mother   . Breast cancer Mother   . Alcohol abuse Father   . Diabetes Father   . Breast cancer Maternal Aunt   . Breast cancer Maternal Grandmother   . Cancer Neg Hx    Social History   Socioeconomic History  . Marital status: Divorced    Spouse name: Not on file  . Number of children: Not on file  . Years of education: Not on file  . Highest education level: Not on file  Occupational History  . Not on file  Tobacco Use  . Smoking status: Never Smoker  . Smokeless tobacco: Never Used   Vaping Use  . Vaping Use: Never used  Substance and Sexual Activity  . Alcohol use: No  . Drug use: No  . Sexual activity: Not Currently  Other Topics Concern  . Not on file  Social History Narrative   Lives at [facility].  Lives with cat/ Maggie.  Education HS.  Children none.  Divorced.  Retired.   Social Determinants of Health   Financial Resource Strain:   . Difficulty of Paying Living Expenses:   Food Insecurity:   . Worried About Charity fundraiser in the Last Year:   . Arboriculturist in the Last Year:   Transportation Needs:   . Film/video editor (Medical):   Marland Kitchen Lack of Transportation (Non-Medical):   Physical Activity:   . Days of Exercise per Week:   . Minutes of Exercise per Session:   Stress:   . Feeling of Stress :   Social Connections:   . Frequency of Communication with Friends and Family:   . Frequency of Social Gatherings with Friends and Family:   . Attends Religious Services:   . Active Member of Clubs or Organizations:   . Attends Archivist Meetings:   Marland Kitchen Marital Status:     Outpatient Encounter Medications as of 01/10/2020  Medication Sig  . acetaminophen (TYLENOL) 325 MG tablet Take 650 mg by mouth 2 (two) times daily.  Marland Kitchen amLODipine (NORVASC) 5 MG tablet Take 1 tablet (5 mg total) by mouth daily.  . ARIPiprazole (ABILIFY) 10 MG tablet Take 5 mg by mouth daily.   Marland Kitchen atorvastatin (LIPITOR) 40 MG tablet Take 1 tablet (40 mg total) by mouth daily.  Marland Kitchen desvenlafaxine (PRISTIQ) 100 MG 24 hr tablet Take 100 mg by mouth daily.  Marland Kitchen levocetirizine (XYZAL) 5 MG tablet Take 1 tablet (5 mg total) by mouth every evening.  . lidocaine (LIDODERM) 5 % Place 1 patch onto the skin daily. Remove & Discard patch within 12 hours or as directed by MD  . LORazepam (ATIVAN) 1 MG tablet Take 1 tablet (1 mg total) by mouth every 8 (eight) hours as needed for anxiety.  . mirabegron ER (MYRBETRIQ) 25 MG TB24 tablet Take 1 tablet (25 mg total) by mouth daily.   . pantoprazole (PROTONIX) 40 MG tablet Take 1 tablet (40 mg total) by mouth daily.  . prazosin (MINIPRESS) 2 MG capsule Take 2 mg by mouth at bedtime.  . sitaGLIPtin (JANUVIA) 100 MG tablet Take 1 tablet (100 mg total) by mouth daily.  . solifenacin (VESICARE) 5 MG tablet Take 1 tablet (5 mg total) by mouth daily.  . SUMAtriptan (IMITREX) 50 MG tablet Take 1 tablet (50 mg total) by mouth every 2 (two) hours as needed for migraine. May repeat in 2 hours if headache persists or recurs.  Marland Kitchen tiotropium (SPIRIVA) 18 MCG inhalation capsule Place 18  mcg into inhaler and inhale daily.  Marland Kitchen zolpidem (AMBIEN) 5 MG tablet Take 5 mg by mouth at bedtime as needed for sleep.   No facility-administered encounter medications on file as of 01/10/2020.    Activities of Daily Living In your present state of health, do you have any difficulty performing the following activities: 01/10/2020  Hearing? N  Vision? N  Difficulty concentrating or making decisions? Y  Walking or climbing stairs? N  Dressing or bathing? N  Doing errands, shopping? Y  Preparing Food and eating ? N  Using the Toilet? N  In the past six months, have you accidently leaked urine? Y  Do you have problems with loss of bowel control? Y  Managing your Medications? Y  Managing your Finances? Y  Housekeeping or managing your Housekeeping? Y  Some recent data might be hidden    Patient Care Team: Janith Lima, MD as PCP - General    Assessment:   This is a routine wellness examination for [facility]  Exercise Activities and Dietary recommendations Current Exercise Habits: The patient does not participate in regular exercise at present, Exercise limited by: psychological condition(s);cardiac condition(s);orthopedic condition(s)  Goals    . Client understands the importance of follow-up with providers by attending scheduled visits       Fall Risk Fall Risk  01/10/2020 04/24/2019 12/19/2017 12/16/2016 04/15/2016  Falls in the past year? 0 1  Yes No No  Number falls in past yr: 0 0 1 - -  Injury with Fall? 0 1 No - -  Risk for fall due to : History of fall(s) Impaired balance/gait;Impaired mobility - - -  Follow up Falls evaluation completed Education provided;Falls evaluation completed Falls evaluation completed - -   Is the patient's home free of loose throw rugs in walkways, pet beds, electrical cords, etc?   yes      Grab bars in the bathroom? yes      Handrails on the stairs?   yes      Adequate lighting?   yes  Timed Get Up and Go performed: not indicated  Depression Screen PHQ 2/9 Scores 01/10/2020 09/27/2018 06/27/2018 03/07/2018  PHQ - 2 Score 1 2 0 0  PHQ- 9 Score - 12 12 12      Cognitive Function MMSE - Mini Mental State Exam 09/27/2018  Orientation to time 5  Orientation to Place 5  Registration 3  Attention/ Calculation 2  Recall 3  Language- name 2 objects 2  Language- repeat 1  Language- follow 3 step command 3  Language- read & follow direction 1  Write a sentence 1  Copy design 1  Total score 27     6CIT Screen 01/10/2020  What Year? 0 points  What month? 0 points  What time? 3 points  Count back from 20 0 points  Months in reverse 0 points  Repeat phrase 0 points  Total Score 3    Immunization History  Administered Date(s) Administered  . Fluad Quad(high Dose 65+) 04/24/2019  . Influenza Split 08/08/2013  . Influenza Whole 05/22/2010  . Influenza, High Dose Seasonal PF 04/16/2016, 04/25/2017, 03/31/2018  . Influenza,inj,Quad PF,6+ Mos 05/13/2014  . Influenza-Unspecified 06/03/2015  . Moderna SARS-COVID-2 Vaccination 09/15/2019  . Pneumococcal Conjugate-13 12/27/2013  . Pneumococcal Polysaccharide-23 04/14/2016  . Td 05/22/2010  . Tdap 02/07/2018  . Zoster 04/14/2016  . Zoster Recombinat (Shingrix) 04/24/2019, 04/26/2019, 05/01/2019, 07/28/2019    Qualifies for Shingles Vaccine? completed  Screening Tests Health Maintenance  Topic Date  Due  . HEMOGLOBIN A1C  03/28/2019  .  COVID-19 Vaccine (2 - Moderna 2-dose series) 10/13/2019  . INFLUENZA VACCINE  03/02/2020  . Fecal DNA (Cologuard)  06/22/2020  . OPHTHALMOLOGY EXAM  07/10/2020  . FOOT EXAM  09/16/2020  . TETANUS/TDAP  02/08/2028  . DEXA SCAN  Completed  . Hepatitis C Screening  Completed  . PNA vac Low Risk Adult  Completed    Cancer Screenings: Lung: Low Dose CT Chest recommended if Age 41-80 years, 30 pack-year currently smoking OR have quit w/in 15years. Patient does qualify. Breast:  Up to date on Mammogram? Yes   Up to date of Bone Density/Dexa? No Colorectal: Yes  Additional Screenings: Hepatitis C Screening: completed     Plan:     Reviewed health maintenance screenings with patient today and relevant education, vaccines, and/or referrals were provided.    Continue doing brain stimulating activities (puzzles, reading, adult coloring books, staying active) to keep memory sharp.    Continue to eat heart healthy diet (full of fruits, vegetables, whole grains, lean protein, water--limit salt, fat, and sugar intake) and increase physical activity as tolerated.  I have personally reviewed and noted the following in the patient's chart:   . Medical and social history . Use of alcohol, tobacco or illicit drugs  . Current medications and supplements . Functional ability and status . Nutritional status . Physical activity . Advanced directives . List of other physicians . Hospitalizations, surgeries, and ER visits in previous 12 months . Vitals . Screenings to include cognitive, depression, and falls . Referrals and appointments  In addition, I have reviewed and discussed with patient certain preventive protocols, quality metrics, and best practice recommendations. A written personalized care plan for preventive services as well as general preventive health recommendations were provided to patient.     Sheral Flow, LPN  [id number]  Nurse Health Advisor

## 2020-01-10 NOTE — Progress Notes (Signed)
Subjective:  Patient ID: [name], female    DOB: [birth year]  Age: 74 y.o. MRN: [id number]  CC: Annual Exam, Hypertension, Diabetes, Hyperlipidemia, and Urinary Tract Infection  This visit occurred during the SARS-CoV-2 public health emergency.  Safety protocols were in place, including screening questions prior to the visit, additional usage of staff PPE, and extensive cleaning of exam room while observing appropriate contact time as indicated for disinfecting solutions.    HPI [name] presents for a CPX.  She complains of chronic, intermittent neck pain that radiates towards her shoulders.  She denies any recent trauma or injury.  She does not experience numbness, weakness, tingling in her arms or legs.  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  She tells me she is losing weight because she does not like the food in her personal care home.  She tells me her blood sugar and blood pressure have been well controlled.  She denies polys.  She is active and denies any recent episodes of chest pain, shortness of breath, palpitations, edema, or fatigue.  She was recently seen for the complaint of foul-smelling urine.  Her urine culture was positive for both Klebsiella and E. coli.  She has completed a course of Bactrim DS.  She is incontinent of urine but she says the urine is clear and she does not experience dysuria.  Outpatient Medications Prior to Visit  Medication Sig Dispense Refill  . acetaminophen (TYLENOL) 325 MG tablet Take 650 mg by mouth 2 (two) times daily.    . ARIPiprazole (ABILIFY) 10 MG tablet Take 5 mg by mouth daily.     Marland Kitchen desvenlafaxine (PRISTIQ) 100 MG 24 hr tablet Take 100 mg by mouth daily.    Marland Kitchen levocetirizine (XYZAL) 5 MG tablet Take 1 tablet (5 mg total) by mouth every evening. 90 tablet 1  . lidocaine (LIDODERM) 5 % Place 1 patch onto the skin daily. Remove & Discard patch within 12 hours or as directed by MD 30 patch 5  . LORazepam  (ATIVAN) 1 MG tablet Take 1 tablet (1 mg total) by mouth every 8 (eight) hours as needed for anxiety. 90 tablet 3  . mirabegron ER (MYRBETRIQ) 25 MG TB24 tablet Take 1 tablet (25 mg total) by mouth daily. 90 tablet 1  . pantoprazole (PROTONIX) 40 MG tablet Take 1 tablet (40 mg total) by mouth daily. 90 tablet 3  . prazosin (MINIPRESS) 2 MG capsule Take 2 mg by mouth at bedtime.    . solifenacin (VESICARE) 5 MG tablet Take 1 tablet (5 mg total) by mouth daily. 90 tablet 1  . SUMAtriptan (IMITREX) 50 MG tablet Take 1 tablet (50 mg total) by mouth every 2 (two) hours as needed for migraine. May repeat in 2 hours if headache persists or recurs. 10 tablet 2  . zolpidem (AMBIEN) 5 MG tablet Take 5 mg by mouth at bedtime as needed for sleep.    Marland Kitchen amLODipine (NORVASC) 5 MG tablet Take 1 tablet (5 mg total) by mouth daily. 30 tablet 11  . atorvastatin (LIPITOR) 40 MG tablet Take 1 tablet (40 mg total) by mouth daily. 90 tablet 1  . sitaGLIPtin (JANUVIA) 100 MG tablet Take 1 tablet (100 mg total) by mouth daily. 90 tablet 1  . tiotropium (SPIRIVA) 18 MCG inhalation capsule Place 18 mcg into inhaler and inhale daily.     No facility-administered medications prior to visit.    ROS Review of Systems  Constitutional: Negative for  appetite change, chills, diaphoresis, fatigue and unexpected weight change.  HENT: Negative.   Eyes: Negative.   Respiratory: Negative for cough, chest tightness, shortness of breath and wheezing.   Cardiovascular: Negative for chest pain, palpitations and leg swelling.  Gastrointestinal: Negative for abdominal pain, constipation, diarrhea, nausea and vomiting.  Endocrine: Negative.  Negative for polydipsia, polyphagia and polyuria.  Genitourinary: Positive for frequency. Negative for difficulty urinating, dysuria, menstrual problem, urgency and vaginal discharge.  Musculoskeletal: Positive for arthralgias and neck pain. Negative for gait problem and myalgias.  Skin: Negative.   Negative for color change, pallor and rash.  Neurological: Negative.  Negative for dizziness, weakness, light-headedness, numbness and headaches.  Hematological: Negative for adenopathy. Does not bruise/bleed easily.  Psychiatric/Behavioral: Positive for confusion and decreased concentration. Negative for dysphoric mood, sleep disturbance and suicidal ideas. The patient is not nervous/anxious.     Objective:  BP 136/80 (BP Location: Left Arm, Patient Position: Sitting, Cuff Size: Large)   Pulse (!) 103   Temp 98.4 F (36.9 C) (Oral)   Ht 5\' 5"  (1.651 m)   Wt 186 lb 8 oz (84.6 kg)   BMI 31.04 kg/m   BP Readings from Last 3 Encounters:  01/10/20 136/80  01/10/20 136/80  12/27/19 132/72    Wt Readings from Last 3 Encounters:  01/10/20 186 lb 8 oz (84.6 kg)  01/10/20 186 lb 8 oz (84.6 kg)  12/27/19 190 lb 8 oz (86.4 kg)    Physical Exam Vitals reviewed.  Constitutional:      Appearance: She is obese. She is not diaphoretic.  HENT:     Nose: Nose normal.     Mouth/Throat:     Mouth: Mucous membranes are moist.  Eyes:     General: No scleral icterus.    Conjunctiva/sclera: Conjunctivae normal.  Cardiovascular:     Rate and Rhythm: Normal rate and regular rhythm.     Heart sounds: No murmur heard.   Pulmonary:     Effort: Pulmonary effort is normal.     Breath sounds: No stridor. No wheezing, rhonchi or rales.  Abdominal:     General: Abdomen is protuberant. Bowel sounds are normal. There is no distension.     Palpations: Abdomen is soft. There is no hepatomegaly, splenomegaly or mass.     Tenderness: There is no abdominal tenderness.  Musculoskeletal:        General: Normal range of motion.     Cervical back: Neck supple. No swelling, edema or bony tenderness. No pain with movement. Normal range of motion.     Thoracic back: Normal.     Lumbar back: Normal.     Right lower leg: No edema.     Left lower leg: No edema.  Lymphadenopathy:     Cervical: No cervical  adenopathy.  Skin:    General: Skin is warm and dry.     Coloration: Skin is not pale.  Neurological:     General: No focal deficit present.     Mental Status: She is alert.  Psychiatric:        Attention and Perception: She is inattentive.        Mood and Affect: Mood normal. Mood is not anxious or depressed.        Speech: Speech is tangential. Speech is not rapid and pressured, delayed or slurred.        Behavior: Behavior normal. Behavior is not slowed or withdrawn. Behavior is cooperative.        Thought Content:  Thought content normal.     Lab Results  Component Value Date   WBC 5.0 01/10/2020   HGB 14.7 01/10/2020   HCT 43.3 01/10/2020   PLT 195.0 01/10/2020   GLUCOSE 137 (H) 01/10/2020   CHOL 135 01/10/2020   TRIG 135.0 01/10/2020   HDL 47.00 01/10/2020   LDLDIRECT 154.0 07/04/2018   LDLCALC 61 01/10/2020   ALT 49 (H) 01/10/2020   AST 63 (H) 01/10/2020   NA 138 01/10/2020   K 4.1 01/10/2020   CL 102 01/10/2020   CREATININE 0.76 01/10/2020   BUN 22 01/10/2020   CO2 29 01/10/2020   TSH 1.84 01/10/2020   INR 1.13 07/20/2018   HGBA1C 6.3 (A) 01/10/2020   HGBA1C 6.3 01/10/2020   MICROALBUR 1.8 01/10/2020    MM 3D SCREEN BREAST BILATERAL  Result Date: 08/06/2019 CLINICAL DATA:  Screening. EXAM: DIGITAL SCREENING BILATERAL MAMMOGRAM WITH TOMO AND CAD COMPARISON:  Previous exam(s). ACR Breast Density Category b: There are scattered areas of fibroglandular density. FINDINGS: There are no findings suspicious for malignancy. Images were processed with CAD. IMPRESSION: No mammographic evidence of malignancy. A result letter of this screening mammogram will be mailed directly to the patient. RECOMMENDATION: Screening mammogram in one year. (Code:SM-B-01Y) BI-RADS CATEGORY  1: Negative. Electronically Signed   By: Margarette Canada M.D.   On: 08/06/2019 08:01    Assessment & Plan:   [name] was seen today for annual exam, hypertension, diabetes, hyperlipidemia and urinary tract  infection.  Diagnoses and all orders for this visit:  Essential hypertension- Her blood pressure is adequately well controlled. -     CBC with Differential/Platelet; Future -     TSH; Future -     Urinalysis, Routine w reflex microscopic; Future -     Urinalysis, Routine w reflex microscopic -     TSH -     CBC with Differential/Platelet  Type II diabetes mellitus with manifestations ([place])- Her A1c is at 6.3%.  Medical therapy is not indicated. -     Basic metabolic panel; Future -     Microalbumin / creatinine urine ratio; Future -     POCT glycosylated hemoglobin (Hb A1C) -     Microalbumin / creatinine urine ratio -     Basic metabolic panel  Acute cystitis with hematuria- Her urine culture is positive for a contaminant but no evidence of infection.  This has resolved. -     CULTURE, URINE COMPREHENSIVE; Future -     CULTURE, URINE COMPREHENSIVE  UTI due to Klebsiella species- See above.  Severe episode of recurrent major depressive disorder, with psychotic features ([place])- She is doing well on Pristiq and Abilify.  Will continue.  Vitamin D deficiency- Her vitamin D level is low.  I have asked her to start taking a vitamin D supplement. -     VITAMIN D 25 Hydroxy (Vit-D Deficiency, Fractures); Future -     VITAMIN D 25 Hydroxy (Vit-D Deficiency, Fractures) -     Cholecalciferol 125 MCG (5000 UT) capsule; Take 1 capsule (5,000 Units total) by mouth daily.  Vitamin B12 deficiency anemia due to intrinsic factor deficiency- Her vitamin D and folate levels are normal now. -     Vitamin B12; Future -     Folate; Future -     Folate -     Vitamin B12  Routine general medical examination at a health care facility- Exam completed, labs reviewed, vaccines reviewed and updated, cancer screenings are up-to-date, patient education was  given.  Hyperlipidemia with target LDL less than 100- I have asked her to take a statin for cardiovascular risk reduction. -     Lipid panel; Future -      Hepatic function panel; Future -     TSH; Future -     TSH -     Hepatic function panel -     Lipid panel -     atorvastatin (LIPITOR) 40 MG tablet; Take 1 tablet (40 mg total) by mouth daily.  Cervical radiculopathy- She wants to see a neurosurgeon about this. -     Ambulatory referral to Neurosurgery  NASH (nonalcoholic steatohepatitis)- Her liver enzymes are elevated.  I have asked her to start pioglitazone to reduce the inflammation from fatty liver disease. -     pioglitazone (ACTOS) 15 MG tablet; Take 1 tablet (15 mg total) by mouth daily.   I have discontinued [name]. [name] amLODipine, tiotropium, and sitaGLIPtin. I am also having her start on Cholecalciferol and pioglitazone. Additionally, I am having her maintain her pantoprazole, acetaminophen, ARIPiprazole, prazosin, levocetirizine, SUMAtriptan, solifenacin, lidocaine, mirabegron ER, zolpidem, desvenlafaxine, LORazepam, and atorvastatin.  Meds ordered this encounter  Medications  . atorvastatin (LIPITOR) 40 MG tablet    Sig: Take 1 tablet (40 mg total) by mouth daily.    Dispense:  90 tablet    Refill:  1  . Cholecalciferol 125 MCG (5000 UT) capsule    Sig: Take 1 capsule (5,000 Units total) by mouth daily.    Dispense:  90 capsule    Refill:  1  . pioglitazone (ACTOS) 15 MG tablet    Sig: Take 1 tablet (15 mg total) by mouth daily.    Dispense:  90 tablet    Refill:  1   In addition to time spent on CPE, I spent 60 minutes in preparing to see the patient by review of recent labs, imaging and procedures, obtaining and reviewing separately obtained history, communicating with the patient and family or caregiver, ordering medications, tests or procedures, and documenting clinical information in the EHR including the differential Dx, treatment, and any further evaluation and other management of 1. Essential hypertension 2. Type II diabetes mellitus with manifestations ([place]) 3. Acute cystitis with hematuria 4. UTI  due to Klebsiella species 5. Severe episode of recurrent major depressive disorder, with psychotic features ([place]) 6. Vitamin D deficiency 7. Vitamin B12 deficiency anemia due to intrinsic factor deficiency 8. Hyperlipidemia with target LDL less than 100 9. Cervical radiculopathy 10. NASH (nonalcoholic steatohepatitis)   Follow-up: Return in about 6 months (around 07/11/2020).  [name], MD

## 2020-01-11 ENCOUNTER — Telehealth: Payer: Self-pay

## 2020-01-11 ENCOUNTER — Ambulatory Visit (INDEPENDENT_AMBULATORY_CARE_PROVIDER_SITE_OTHER): Payer: Medicare PPO | Admitting: Otolaryngology

## 2020-01-11 NOTE — Telephone Encounter (Signed)
New message    Nanine Means calling needs to confirm new orders that were sent over yesterday.   Please advise

## 2020-01-11 NOTE — Telephone Encounter (Signed)
    Please return call to Adria Devon

## 2020-01-12 LAB — CULTURE, URINE COMPREHENSIVE

## 2020-01-15 ENCOUNTER — Encounter: Payer: Self-pay | Admitting: Internal Medicine

## 2020-01-15 ENCOUNTER — Telehealth: Payer: Self-pay | Admitting: Internal Medicine

## 2020-01-15 ENCOUNTER — Other Ambulatory Visit: Payer: Self-pay | Admitting: Internal Medicine

## 2020-01-15 DIAGNOSIS — M542 Cervicalgia: Secondary | ICD-10-CM

## 2020-01-15 DIAGNOSIS — M5412 Radiculopathy, cervical region: Secondary | ICD-10-CM

## 2020-01-15 NOTE — Telephone Encounter (Signed)
   [facility] requesting order be re-fax to [phone] Orders accidentally thrown away  Phone [phone]

## 2020-01-15 NOTE — Telephone Encounter (Signed)
    Please call Nanine Means at Phone [phone] The administrator would like to discuss recent order and questionable physician signatures

## 2020-01-15 NOTE — Telephone Encounter (Signed)
There is another note about calling [facility] back regarding medications and orders (new and dc).

## 2020-01-15 NOTE — Telephone Encounter (Signed)
Spoke to [employer], Therapist, sports with [employer].   Can you clarify the zolpidem order: PRN or Daily? Currently it is given qd.   Can you clarify the tylenol order: 650 mg bid prn for pain? Or do you want it be given more often if needed.   I will print up the dc orders for Tylenol Extra Strength, Spiriva, Januvia and the amlodipine. Also, I will print up the order for the vitamin D.

## 2020-01-15 NOTE — Telephone Encounter (Signed)
Can you order MRI of pt cervical spine for Dr. Trenton Gammon so it can be done before her appointment with him next month?

## 2020-01-17 NOTE — Telephone Encounter (Addendum)
    Please return call to [facility]

## 2020-01-17 NOTE — Addendum Note (Signed)
Addended by: Aviva Signs M on: 01/17/2020 10:09 AM   Modules accepted: Orders

## 2020-01-17 NOTE — Progress Notes (Signed)
Spoke to Cade and confirmed the psych medications that pt is taking. Med list updated.

## 2020-01-17 NOTE — Telephone Encounter (Signed)
F/u    [facility] calling back to speak with the CMA  checking on the status of the order  Was told to call back today

## 2020-01-18 NOTE — Telephone Encounter (Signed)
Per PCP letter completed for current administration order and newly discontinued medications. Letter and orders were faxed yesterday (Thursday).   Called Cassandra and lvm for her to call back.

## 2020-01-18 NOTE — Telephone Encounter (Signed)
Called [name] and they have not received the fax from yesterday.   fax # [phone]

## 2020-01-21 NOTE — Telephone Encounter (Signed)
Medication order and DC orders were refaxed on Friday. I called [facility] and lvm to let me know if they did not receive the faxes.

## 2020-01-22 NOTE — Telephone Encounter (Signed)
prophylactic order for abx prn UTI sx.

## 2020-01-23 NOTE — Telephone Encounter (Signed)
New message:   [name] is calling from [facility] and states you all talked on Friday but she is returning your call. Please advise.

## 2020-01-23 NOTE — Telephone Encounter (Signed)
Contacted Kasaya and she stated that we spoke on Friday about the current medication orders. She asked about the prophalyxic abx order to treat UTI. Informed Anda Latina that I spoke to [facility] and Horris Latino stated that [facility] Means did not have orders for an abx. I informed Cheryle Horsfall that I will have to speak to Dr. Ronnald Ramp about the abx order and that I will fax the order if approved.

## 2020-01-23 NOTE — Telephone Encounter (Addendum)
Spoke to PCP and rx for prophylactic abx was denied.   LVM for [name] to call back.   I will send a fax to [facility] of same.

## 2020-02-01 ENCOUNTER — Telehealth: Payer: Self-pay

## 2020-02-01 NOTE — Telephone Encounter (Signed)
New message   POA Kaoru Benda has an upcoming MRI end of this month.  LORazepam (ATIVAN) 1 MG tablet - can the prescription be updated for MRI only to give one or two before MRI.   [facility]

## 2020-02-07 NOTE — Telephone Encounter (Signed)
Okay to write order for lorazepam 1 mg - give 2mg  po on 7.18.21 at noon.   Please advise

## 2020-02-13 NOTE — Telephone Encounter (Signed)
Order has been faxed to [facility]. 

## 2020-02-16 ENCOUNTER — Other Ambulatory Visit: Payer: Self-pay | Admitting: Family Medicine

## 2020-02-16 ENCOUNTER — Telehealth: Payer: Self-pay | Admitting: Family Medicine

## 2020-02-16 NOTE — Progress Notes (Signed)
Called [facility], fax not received for ativan order prior to MRI. Fax sent this morning giving order to give 2mg  of ativan at noon prior to MRI.  Faxed received and confirmed with [facility].   Called bonnie to relay message and will also fax order to her.   Orma Flaming, MD [place]

## 2020-02-16 NOTE — Telephone Encounter (Signed)
Rx refill has been faxed and received.

## 2020-02-16 NOTE — Telephone Encounter (Signed)
Please call [facility] where she is a resident and see if they received the fax for ativan for her mri tomorrow. Per notes in chart the doctor office faxed it..   Thanks,  Dr. Rogers Blocker

## 2020-02-17 ENCOUNTER — Other Ambulatory Visit: Payer: Self-pay

## 2020-02-17 ENCOUNTER — Ambulatory Visit
Admission: RE | Admit: 2020-02-17 | Discharge: 2020-02-17 | Disposition: A | Discharge status: Home / Self Care | Payer: Medicare PPO | Source: Ambulatory Visit | Attending: Internal Medicine | Admitting: Internal Medicine

## 2020-02-17 DIAGNOSIS — M542 Cervicalgia: Secondary | ICD-10-CM

## 2020-02-17 DIAGNOSIS — M50123 Cervical disc disorder at C6-C7 level with radiculopathy: Secondary | ICD-10-CM | POA: Diagnosis not present

## 2020-02-17 DIAGNOSIS — M50121 Cervical disc disorder at C4-C5 level with radiculopathy: Secondary | ICD-10-CM | POA: Diagnosis not present

## 2020-02-17 DIAGNOSIS — M4722 Other spondylosis with radiculopathy, cervical region: Secondary | ICD-10-CM | POA: Diagnosis not present

## 2020-02-17 DIAGNOSIS — M2578 Osteophyte, vertebrae: Secondary | ICD-10-CM | POA: Diagnosis not present

## 2020-02-17 DIAGNOSIS — M5412 Radiculopathy, cervical region: Secondary | ICD-10-CM

## 2020-02-28 DIAGNOSIS — M4802 Spinal stenosis, cervical region: Secondary | ICD-10-CM | POA: Diagnosis not present

## 2020-02-28 DIAGNOSIS — G919 Hydrocephalus, unspecified: Secondary | ICD-10-CM | POA: Diagnosis not present

## 2020-04-08 ENCOUNTER — Ambulatory Visit: Payer: Medicare PPO | Admitting: Podiatry

## 2020-04-08 ENCOUNTER — Encounter: Payer: Self-pay | Admitting: Podiatry

## 2020-04-08 ENCOUNTER — Other Ambulatory Visit: Payer: Self-pay

## 2020-04-08 DIAGNOSIS — B351 Tinea unguium: Secondary | ICD-10-CM | POA: Diagnosis not present

## 2020-04-08 DIAGNOSIS — E119 Type 2 diabetes mellitus without complications: Secondary | ICD-10-CM | POA: Diagnosis not present

## 2020-04-08 DIAGNOSIS — M79674 Pain in right toe(s): Secondary | ICD-10-CM

## 2020-04-08 DIAGNOSIS — M79675 Pain in left toe(s): Secondary | ICD-10-CM | POA: Diagnosis not present

## 2020-04-08 NOTE — Progress Notes (Signed)
Complaint:  Visit Type: Patient returns to my office for continued preventative foot care services. Complaint: Patient states" my  big nails have grown long and thick and become painful to walk and wear shoes" Patient has been diagnosed with DM with no foot complications. The patient presents for preventative foot care services. No changes to ROS  Podiatric Exam: Vascular: dorsalis pedis and posterior tibial pulses are palpable bilateral. Capillary return is immediate. Temperature gradient is WNL. Skin turgor WNL  Sensorium: Normal Semmes Weinstein monofilament test. Normal tactile sensation bilaterally. Nail Exam: Pt has thick disfigured discolored nails with subungual debris noted bilateral entire nail hallux through fifth toenails Ulcer Exam: There is no evidence of ulcer or pre-ulcerative changes or infection. Orthopedic Exam: Muscle tone and strength are WNL. No limitations in general ROM. No crepitus or effusions noted. Foot type and digits show no abnormalities. Bony prominences are unremarkable. Skin: No Porokeratosis. No infection or ulcers  Diagnosis:  Onychomycosis, , Pain in right toe, pain in left toes  Treatment & Plan Procedures and Treatment: Consent by patient was obtained for treatment procedures.   Debridement of mycotic and hypertrophic toenails, 1 through 5 bilateral and clearing of subungual debris. No ulceration, no infection noted.  Return Visit-Office Procedure: Patient instructed to return to the office for a follow up visit 6  months for continued evaluation and treatment.    Gardiner Barefoot DPM

## 2020-04-15 DIAGNOSIS — E119 Type 2 diabetes mellitus without complications: Secondary | ICD-10-CM | POA: Diagnosis not present

## 2020-04-15 DIAGNOSIS — K219 Gastro-esophageal reflux disease without esophagitis: Secondary | ICD-10-CM | POA: Diagnosis not present

## 2020-04-15 DIAGNOSIS — Z8249 Family history of ischemic heart disease and other diseases of the circulatory system: Secondary | ICD-10-CM | POA: Diagnosis not present

## 2020-04-15 DIAGNOSIS — E669 Obesity, unspecified: Secondary | ICD-10-CM | POA: Diagnosis not present

## 2020-04-15 DIAGNOSIS — E785 Hyperlipidemia, unspecified: Secondary | ICD-10-CM | POA: Diagnosis not present

## 2020-04-15 DIAGNOSIS — Z87440 Personal history of urinary (tract) infections: Secondary | ICD-10-CM | POA: Diagnosis not present

## 2020-04-15 DIAGNOSIS — Z823 Family history of stroke: Secondary | ICD-10-CM | POA: Diagnosis not present

## 2020-04-15 DIAGNOSIS — Z7984 Long term (current) use of oral hypoglycemic drugs: Secondary | ICD-10-CM | POA: Diagnosis not present

## 2020-04-15 DIAGNOSIS — I10 Essential (primary) hypertension: Secondary | ICD-10-CM | POA: Diagnosis not present

## 2020-04-15 DIAGNOSIS — Z6834 Body mass index (BMI) 34.0-34.9, adult: Secondary | ICD-10-CM | POA: Diagnosis not present

## 2020-04-15 DIAGNOSIS — F419 Anxiety disorder, unspecified: Secondary | ICD-10-CM | POA: Diagnosis not present

## 2020-04-15 DIAGNOSIS — J309 Allergic rhinitis, unspecified: Secondary | ICD-10-CM | POA: Diagnosis not present

## 2020-04-15 DIAGNOSIS — G4700 Insomnia, unspecified: Secondary | ICD-10-CM | POA: Diagnosis not present

## 2020-04-15 DIAGNOSIS — Z604 Social exclusion and rejection: Secondary | ICD-10-CM | POA: Diagnosis not present

## 2020-04-15 DIAGNOSIS — F349 Persistent mood [affective] disorder, unspecified: Secondary | ICD-10-CM | POA: Diagnosis not present

## 2020-04-15 DIAGNOSIS — Z803 Family history of malignant neoplasm of breast: Secondary | ICD-10-CM | POA: Diagnosis not present

## 2020-04-15 DIAGNOSIS — Z825 Family history of asthma and other chronic lower respiratory diseases: Secondary | ICD-10-CM | POA: Diagnosis not present

## 2020-04-30 DIAGNOSIS — G919 Hydrocephalus, unspecified: Secondary | ICD-10-CM | POA: Diagnosis not present

## 2020-07-02 ENCOUNTER — Other Ambulatory Visit: Payer: Self-pay | Admitting: Internal Medicine

## 2020-07-02 DIAGNOSIS — Z1231 Encounter for screening mammogram for malignant neoplasm of breast: Secondary | ICD-10-CM

## 2020-07-24 ENCOUNTER — Telehealth: Payer: Self-pay

## 2020-07-24 ENCOUNTER — Other Ambulatory Visit: Payer: Self-pay | Admitting: Internal Medicine

## 2020-07-24 DIAGNOSIS — F5104 Psychophysiologic insomnia: Secondary | ICD-10-CM

## 2020-07-24 MED ORDER — ZOLPIDEM TARTRATE 5 MG PO TABS: 5.0000 mg | ORAL_TABLET | Freq: Every evening | ORAL | 0 refills | Status: DC | PRN

## 2020-07-24 NOTE — Telephone Encounter (Signed)
[facility] calling requesting pt get refill for Ambien, states "she has been out for a while & really needs it".  States they are aware that she has not had OV in since June, but is requesting an emergency refill until her next OV on 12/27. Unable to determine when the pt was last prescribed Ambien

## 2020-07-26 ENCOUNTER — Encounter: Payer: Self-pay | Admitting: Internal Medicine

## 2020-07-28 ENCOUNTER — Telehealth: Payer: Self-pay | Admitting: Internal Medicine

## 2020-07-28 ENCOUNTER — Ambulatory Visit (INDEPENDENT_AMBULATORY_CARE_PROVIDER_SITE_OTHER): Payer: Medicare PPO | Admitting: Internal Medicine

## 2020-07-28 ENCOUNTER — Encounter: Payer: Self-pay | Admitting: Internal Medicine

## 2020-07-28 ENCOUNTER — Other Ambulatory Visit: Payer: Self-pay

## 2020-07-28 ENCOUNTER — Ambulatory Visit (INDEPENDENT_AMBULATORY_CARE_PROVIDER_SITE_OTHER): Payer: Medicare PPO

## 2020-07-28 VITALS — BP 126/72 | HR 93 | Temp 98.1°F | Ht 65.0 in | Wt 204.0 lb

## 2020-07-28 DIAGNOSIS — K7581 Nonalcoholic steatohepatitis (NASH): Secondary | ICD-10-CM | POA: Diagnosis not present

## 2020-07-28 DIAGNOSIS — I10 Essential (primary) hypertension: Secondary | ICD-10-CM

## 2020-07-28 DIAGNOSIS — R7989 Other specified abnormal findings of blood chemistry: Secondary | ICD-10-CM | POA: Diagnosis not present

## 2020-07-28 DIAGNOSIS — R058 Other specified cough: Secondary | ICD-10-CM | POA: Diagnosis not present

## 2020-07-28 DIAGNOSIS — E118 Type 2 diabetes mellitus with unspecified complications: Secondary | ICD-10-CM

## 2020-07-28 DIAGNOSIS — M25512 Pain in left shoulder: Secondary | ICD-10-CM

## 2020-07-28 DIAGNOSIS — Z961 Presence of intraocular lens: Secondary | ICD-10-CM | POA: Diagnosis not present

## 2020-07-28 DIAGNOSIS — J9811 Atelectasis: Secondary | ICD-10-CM | POA: Diagnosis not present

## 2020-07-28 DIAGNOSIS — R059 Cough, unspecified: Secondary | ICD-10-CM | POA: Diagnosis not present

## 2020-07-28 DIAGNOSIS — J160 Chlamydial pneumonia: Secondary | ICD-10-CM | POA: Insufficient documentation

## 2020-07-28 DIAGNOSIS — H52223 Regular astigmatism, bilateral: Secondary | ICD-10-CM | POA: Diagnosis not present

## 2020-07-28 DIAGNOSIS — Z9849 Cataract extraction status, unspecified eye: Secondary | ICD-10-CM | POA: Diagnosis not present

## 2020-07-28 DIAGNOSIS — J189 Pneumonia, unspecified organism: Secondary | ICD-10-CM | POA: Insufficient documentation

## 2020-07-28 DIAGNOSIS — R918 Other nonspecific abnormal finding of lung field: Secondary | ICD-10-CM | POA: Diagnosis not present

## 2020-07-28 DIAGNOSIS — H524 Presbyopia: Secondary | ICD-10-CM | POA: Diagnosis not present

## 2020-07-28 DIAGNOSIS — G8929 Other chronic pain: Secondary | ICD-10-CM

## 2020-07-28 DIAGNOSIS — H353210 Exudative age-related macular degeneration, right eye, stage unspecified: Secondary | ICD-10-CM | POA: Diagnosis not present

## 2020-07-28 MED ORDER — MOXIFLOXACIN HCL 400 MG PO TABS
400.0000 mg | ORAL_TABLET | Freq: Every day | ORAL | 0 refills | Status: AC
Start: 2020-07-28 — End: 2020-08-04

## 2020-07-28 NOTE — Telephone Encounter (Signed)
Concerns communicated to PCP to discuss during OV today.   Pt is here in office and has been roomed for evaluation.

## 2020-07-28 NOTE — Telephone Encounter (Signed)
[name] from [facility]  They need to know whether patient should D/C the zolpidem (AMBIEN) 5 MG tablet. Please write out a prescription showing the decision to put in patient's record at the facility.   Patient also expressed some concerns about her left shoulder.   Please call Janelle Floor back at [phone] for further comments or concerns.

## 2020-07-28 NOTE — Patient Instructions (Signed)
Community-Acquired Pneumonia, Adult Pneumonia is an infection of the lungs. It causes swelling in the airways of the lungs. Mucus and fluid may also build up inside the airways. One type of pneumonia can happen while a person is in a hospital. A different type can happen when a person is not in a hospital (community-acquired pneumonia).  What are the causes?  This condition is caused by germs (viruses, bacteria, or fungi). Some types of germs can be passed from one person to another. This can happen when you breathe in droplets from the cough or sneeze of an infected person. What increases the risk? You are more likely to develop this condition if you:  Have a long-term (chronic) disease, such as: ? Chronic obstructive pulmonary disease (COPD). ? Asthma. ? Cystic fibrosis. ? Congestive heart failure. ? Diabetes. ? Kidney disease.  Have HIV.  Have sickle cell disease.  Have had your spleen removed.  Do not take good care of your teeth and mouth (poor dental hygiene).  Have a medical condition that increases the risk of breathing in droplets from your own mouth and nose.  Have a weakened body defense system (immune system).  Are a smoker.  Travel to areas where the germs that cause this illness are common.  Are around certain animals or the places they live. What are the signs or symptoms?  A dry cough.  A wet (productive) cough.  Fever.  Sweating.  Chest pain. This often happens when breathing deeply or coughing.  Fast breathing or trouble breathing.  Shortness of breath.  Shaking chills.  Feeling tired (fatigue).  Muscle aches. How is this treated? Treatment for this condition depends on many things. Most adults can be treated at home. In some cases, treatment must happen in a hospital. Treatment may include:  Medicines given by mouth or through an IV tube.  Being given extra oxygen.  Respiratory therapy. In rare cases, treatment for very bad pneumonia  may include:  Using a machine to help you breathe.  Having a procedure to remove fluid from around your lungs. Follow these instructions at home: Medicines  Take over-the-counter and prescription medicines only as told by your doctor. ? Only take cough medicine if you are losing sleep.  If you were prescribed an antibiotic medicine, take it as told by your doctor. Do not stop taking the antibiotic even if you start to feel better. General instructions   Sleep with your head and neck raised (elevated). You can do this by sleeping in a recliner or by putting a few pillows under your head.  Rest as needed. Get at least 8 hours of sleep each night.  Drink enough water to keep your pee (urine) pale yellow.  Eat a healthy diet that includes plenty of vegetables, fruits, whole grains, low-fat dairy products, and lean protein.  Do not use any products that contain nicotine or tobacco. These include cigarettes, e-cigarettes, and chewing tobacco. If you need help quitting, ask your doctor.  Keep all follow-up visits as told by your doctor. This is important. How is this prevented? A shot (vaccine) can help prevent pneumonia. Shots are often suggested for:  People older than 73 years of age.  People older than 73 years of age who: ? Are having cancer treatment. ? Have long-term (chronic) lung disease. ? Have problems with their body's defense system. You may also prevent pneumonia if you take these actions:  Get the flu (influenza) shot every year.  Go to the dentist as   often as told.  Wash your hands often. If you cannot use soap and water, use hand sanitizer. Contact a doctor if:  You have a fever.  You lose sleep because your cough medicine does not help. Get help right away if:  You are short of breath and it gets worse.  You have more chest pain.  Your sickness gets worse. This is very serious if: ? You are an older adult. ? Your body's defense system is weak.  You  cough up blood. Summary  Pneumonia is an infection of the lungs.  Most adults can be treated at home. Some will need treatment in a hospital.  Drink enough water to keep your pee pale yellow.  Get at least 8 hours of sleep each night. This information is not intended to replace advice given to you by your health care provider. Make sure you discuss any questions you have with your health care provider. Document Revised: 11/08/2018 Document Reviewed: 03/16/2018 Elsevier Patient Education  2020 Elsevier Inc.  

## 2020-07-28 NOTE — Progress Notes (Signed)
Subjective:  Patient ID: [name], female    DOB: 11-15-1945  Age: 74 y.o. MRN: [id number]  CC: Cough  This visit occurred during the SARS-CoV-2 public health emergency.  Safety protocols were in place, including screening questions prior to the visit, additional usage of staff PPE, and extensive cleaning of exam room while observing appropriate contact time as indicated for disinfecting solutions.    HPI [name] presents for f/up -  1.  She complains of a 46-month history of left shoulder pain and decreasing range of motion.  She denies trauma or injury.  2.  She complains of a 5-day history of cough productive of yellow phlegm.  She denies shortness of breath, wheezing, chest pain, hemoptysis, fever, or chills.  Outpatient Medications Prior to Visit  Medication Sig Dispense Refill  . amLODipine (NORVASC) 5 MG tablet     . ARIPiprazole (ABILIFY) 5 MG tablet     . atorvastatin (LIPITOR) 40 MG tablet Take 1 tablet (40 mg total) by mouth daily. 90 tablet 1  . Cholecalciferol 125 MCG (5000 UT) capsule Take 1 capsule (5,000 Units total) by mouth daily. 90 capsule 1  . desvenlafaxine (PRISTIQ) 100 MG 24 hr tablet Take 100 mg by mouth daily.    Marland Kitchen JANUVIA 100 MG tablet     . levocetirizine (XYZAL) 5 MG tablet Take 1 tablet (5 mg total) by mouth every evening. 90 tablet 1  . lidocaine (LIDODERM) 5 % Place 1 patch onto the skin daily. Remove & Discard patch within 12 hours or as directed by MD 30 patch 5  . LORazepam (ATIVAN) 1 MG tablet Take 1 tablet (1 mg total) by mouth every 8 (eight) hours as needed for anxiety. 90 tablet 3  . mirabegron ER (MYRBETRIQ) 25 MG TB24 tablet Take 1 tablet (25 mg total) by mouth daily. 90 tablet 1  . pantoprazole (PROTONIX) 40 MG tablet Take 1 tablet (40 mg total) by mouth daily. 90 tablet 3  . pioglitazone (ACTOS) 15 MG tablet Take 1 tablet (15 mg total) by mouth daily. 90 tablet 1  . prazosin (MINIPRESS) 2 MG capsule Take 2 mg by mouth at  bedtime.    . solifenacin (VESICARE) 5 MG tablet Take 1 tablet (5 mg total) by mouth daily. 90 tablet 1  . SPIRIVA HANDIHALER 18 MCG inhalation capsule     . SUMAtriptan (IMITREX) 50 MG tablet Take 1 tablet (50 mg total) by mouth every 2 (two) hours as needed for migraine. May repeat in 2 hours if headache persists or recurs. 10 tablet 2  . zolpidem (AMBIEN) 5 MG tablet Take 1 tablet (5 mg total) by mouth at bedtime as needed for sleep. 90 tablet 0   No facility-administered medications prior to visit.    ROS Review of Systems  Constitutional: Negative.  Negative for appetite change, chills, diaphoresis, fatigue and fever.  HENT: Positive for hearing loss. Negative for sore throat and trouble swallowing.   Eyes: Negative.   Respiratory: Positive for cough. Negative for chest tightness, shortness of breath and wheezing.   Cardiovascular: Negative for chest pain, palpitations and leg swelling.  Gastrointestinal: Negative for abdominal pain, constipation, diarrhea, nausea and vomiting.  Endocrine: Positive for polyuria.  Genitourinary: Positive for frequency. Negative for decreased urine volume, difficulty urinating, dysuria and urgency.  Musculoskeletal: Positive for arthralgias. Negative for joint swelling and myalgias.  Skin: Negative.  Negative for color change, pallor and rash.  Allergic/Immunologic: Negative.   Neurological: Negative.  Negative for  dizziness, weakness, light-headedness and numbness.  Hematological: Negative for adenopathy. Does not bruise/bleed easily.  Psychiatric/Behavioral: Negative.     Objective:  BP 126/72   Pulse 93   Temp 98.1 F (36.7 C) (Oral)   Ht 5\' 5"  (1.651 m)   Wt 204 lb (92.5 kg)   SpO2 95%   BMI 33.95 kg/m   BP Readings from Last 3 Encounters:  07/28/20 126/72  01/10/20 136/80  01/10/20 136/80    Wt Readings from Last 3 Encounters:  07/28/20 204 lb (92.5 kg)  01/10/20 186 lb 8 oz (84.6 kg)  01/10/20 186 lb 8 oz (84.6 kg)     Physical Exam Constitutional:      General: She is not in acute distress.    Appearance: She is ill-appearing (ataxic, uses a 4-pronge walker). She is not toxic-appearing or diaphoretic.  HENT:     Nose: Nose normal.     Mouth/Throat:     Mouth: Mucous membranes are moist.  Eyes:     General: No scleral icterus.    Conjunctiva/sclera: Conjunctivae normal.  Cardiovascular:     Rate and Rhythm: Normal rate and regular rhythm.     Heart sounds: No murmur heard.   Pulmonary:     Effort: Pulmonary effort is normal.     Breath sounds: No stridor. No wheezing, rhonchi or rales.  Abdominal:     General: Abdomen is protuberant. There is no distension.     Palpations: Abdomen is soft. There is no hepatomegaly, splenomegaly or mass.     Tenderness: There is no right CVA tenderness.  Musculoskeletal:        General: Normal range of motion.     Right shoulder: Normal.     Left shoulder: No swelling, deformity, tenderness or bony tenderness.     Cervical back: Neck supple.     Right lower leg: No edema.     Left lower leg: No edema.  Lymphadenopathy:     Cervical: No cervical adenopathy.  Skin:    General: Skin is warm and dry.     Coloration: Skin is not pale.  Neurological:     General: No focal deficit present.     Mental Status: She is alert and oriented to person, place, and time. Mental status is at baseline.  Psychiatric:        Mood and Affect: Mood normal.     Lab Results  Component Value Date   WBC 5.0 01/10/2020   HGB 14.7 01/10/2020   HCT 43.3 01/10/2020   PLT 195.0 01/10/2020   GLUCOSE 137 (H) 01/10/2020   CHOL 135 01/10/2020   TRIG 135.0 01/10/2020   HDL 47.00 01/10/2020   LDLDIRECT 154.0 07/04/2018   LDLCALC 61 01/10/2020   ALT 49 (H) 01/10/2020   AST 63 (H) 01/10/2020   NA 138 01/10/2020   K 4.1 01/10/2020   CL 102 01/10/2020   CREATININE 0.76 01/10/2020   BUN 22 01/10/2020   CO2 29 01/10/2020   TSH 1.84 01/10/2020   INR 1.13 07/20/2018    HGBA1C 6.3 (A) 01/10/2020   HGBA1C 6.3 01/10/2020   MICROALBUR 1.8 01/10/2020    MR Cervical Spine Wo Contrast  Result Date: 02/18/2020 CLINICAL DATA:  Neck pain. EXAM: MRI CERVICAL SPINE WITHOUT CONTRAST TECHNIQUE: Multiplanar, multisequence MR imaging of the cervical spine was performed. No intravenous contrast was administered. COMPARISON:  05/16/2019 CT cervical spine. 02/10/2014 MRI cervical spine. FINDINGS: Alignment: Straightening of cervical lordosis.  No listhesis. Vertebrae: Normal bone marrow  signal intensity. No focal osseous lesion. Cord: Normal signal and morphology. Posterior Fossa, vertebral arteries: Negative. Disc levels: Multilevel osteophytosis and desiccation with mild C4-7 disc space loss. C2-3: No significant disc bulge, spinal canal or neural foraminal narrowing. C3-4: Disc osteophyte complex with uncovertebral and bilateral facet hypertrophy. Mild spinal canal, mild right and moderate left neural foraminal narrowing. C4-5: Disc osteophyte complex with small superimposed right subarticular protrusion, uncovertebral and bilateral facet hypertrophy. Moderate spinal canal and bilateral neural foraminal narrowing. C5-6: Disc osteophyte complex with superimposed central protrusion abutting the ventral cord, uncovertebral and bilateral facet hypertrophy. Moderate spinal canal and severe bilateral neural foraminal narrowing. C6-7: Disc osteophyte complex with superimposed left paracentral protrusion abutting the ventral cord, uncovertebral and bilateral facet hypertrophy. Moderate spinal canal, moderate right and mild left neural foraminal narrowing. C7-T1: No significant disc bulge, spinal canal or neural foraminal narrowing. Paraspinal tissues: Within normal limits. IMPRESSION: Moderate spinal canal narrowing at the C4-5, C5-6 and C6-7 levels. Severe bilateral C5-6 and moderate C4-5 neural foraminal narrowing. Moderate left C3-4 and right C6-7 neural foraminal narrowing. Electronically  Signed   By: Stana Bunting M.D.   On: 02/18/2020 17:43   DG Chest 2 View  Result Date: 07/28/2020 CLINICAL DATA:  Cough EXAM: CHEST - 2 VIEW COMPARISON:  09/16/2018 FINDINGS: The heart is normal size. Patchy right basilar atelectasis or infiltrates. Left lung clear. No effusions or pneumothorax. No acute bony abnormality. IMPRESSION: Patchy right basilar opacity could reflect atelectasis or early infiltrate. Electronically Signed   By: Charlett Nose M.D.   On: 07/28/2020 11:45    Assessment & Plan:   Laylaa was seen today for cough.  Diagnoses and all orders for this visit:  Chronic left shoulder pain -     Ambulatory referral to Sports Medicine  Productive cough- On her x-ray she has a right lower lobe pneumonia. -     DG Chest 2 View; Future  Elevated LFTs  Essential hypertension- Her blood pressure is adequately well controlled.  I will monitor her electrolytes and renal function. -     CBC with Differential/Platelet; Future -     Basic metabolic panel; Future  NASH (nonalcoholic steatohepatitis)- I will monitor her LFTs.  She agrees to work on her lifestyle modifications. -     Protime-INR; Future -     Hepatic function panel; Future  Type II diabetes mellitus with manifestations (HCC)- Will check her A1c and will treat if indicated. -     Basic metabolic panel; Future -     Hemoglobin A1c; Future  Community acquired pneumonia of right lower lobe of lung -     CBC with Differential/Platelet; Future -     moxifloxacin (AVELOX) 400 MG tablet; Take 1 tablet (400 mg total) by mouth daily for 7 days.   I am having Orson Aloe start on moxifloxacin. I am also having her maintain her pantoprazole, prazosin, levocetirizine, SUMAtriptan, solifenacin, lidocaine, mirabegron ER, desvenlafaxine, LORazepam, atorvastatin, Cholecalciferol, pioglitazone, ARIPiprazole, amLODipine, Januvia, Spiriva HandiHaler, and zolpidem.  Meds ordered this encounter  Medications  .  moxifloxacin (AVELOX) 400 MG tablet    Sig: Take 1 tablet (400 mg total) by mouth daily for 7 days.    Dispense:  7 tablet    Refill:  0   I spent 50 minutes in preparing to see the patient by review of recent labs, imaging and procedures, obtaining and reviewing separately obtained history, communicating with the patient and family or caregiver, ordering medications, tests or procedures, and  documenting clinical information in the EHR including the differential Dx, treatment, and any further evaluation and other management of 1. Chronic left shoulder pain 2. Productive cough 3. Elevated LFTs 4. Essential hypertension 5. NASH (nonalcoholic steatohepatitis) 6. Type II diabetes mellitus with manifestations ([place]) 7. Community acquired pneumonia of right lower lobe of lung     Follow-up: Return in about 3 weeks (around 08/18/2020).  [name], MD

## 2020-07-30 NOTE — Progress Notes (Deleted)
    Subjective:    CC: L shoulder pain  I, Shaelee Forni, LAT, ATC, am serving as scribe for Dr. Clementeen Graham.  HPI: Pt is a 74 y/o female presenting w/ c/o L shoulder pain x at least 6 months w/ no known MOI.  She locates her pain to .  Radiating pain: L shoulder mechanical symptoms: Aggravating factors: Treatments tried:   Diagnostic testing: C-spine MRI- 02/17/20  Pertinent review of Systems: ***  Relevant historical information: ***   Objective:   There were no vitals filed for this visit. General: Well Developed, well nourished, and in no acute distress.   MSK: ***  Lab and Radiology Results No results found for this or any previous visit (from the past 72 hour(s)). DG Chest 2 View  Result Date: 07/28/2020 CLINICAL DATA:  Cough EXAM: CHEST - 2 VIEW COMPARISON:  09/16/2018 FINDINGS: The heart is normal size. Patchy right basilar atelectasis or infiltrates. Left lung clear. No effusions or pneumothorax. No acute bony abnormality. IMPRESSION: Patchy right basilar opacity could reflect atelectasis or early infiltrate. Electronically Signed   By: Charlett Nose M.D.   On: 07/28/2020 11:45      Impression and Recommendations:    Assessment and Plan: 74 y.o. female with ***.  PDMP not reviewed this encounter. No orders of the defined types were placed in this encounter.  No orders of the defined types were placed in this encounter.   Discussed warning signs or symptoms. Please see discharge instructions. Patient expresses understanding.   ***

## 2020-07-31 ENCOUNTER — Ambulatory Visit: Payer: Medicare PPO | Admitting: Family Medicine

## 2020-07-31 ENCOUNTER — Telehealth (INDEPENDENT_AMBULATORY_CARE_PROVIDER_SITE_OTHER): Payer: Medicare PPO | Admitting: Family Medicine

## 2020-07-31 ENCOUNTER — Telehealth: Payer: Self-pay | Admitting: Internal Medicine

## 2020-07-31 ENCOUNTER — Other Ambulatory Visit: Payer: Self-pay

## 2020-07-31 DIAGNOSIS — R059 Cough, unspecified: Secondary | ICD-10-CM | POA: Diagnosis not present

## 2020-07-31 NOTE — Telephone Encounter (Signed)
[facility] Report/Call: --Caller states pt was diagnosed with community access pneumonia on Monday and states she is very sick today. She is on antibiotics. Caller states pt is in an assisted living facility.  Advised: SEE PCP WITHIN 24 HOURS: * IF OFFICE WILL BE CLOSED: You need to be seen within the next 24 hours. A clinic or an urgent care center is often a good source of care if your doctor's office is closed or you can't get an appointment. CALL BACK IF: * Difficulty breathing worsens or occurs * You become worse  Patient was seen on 07/28/20 by Dr.Jones.

## 2020-07-31 NOTE — Progress Notes (Signed)
Virtual Visit via Telephone Note  I connected with [name] on 07/31/20 at  5:20 PM EST by telephone and verified that I am speaking with the correct person using two identifiers.   I discussed the limitations, risks, security and privacy concerns of performing an evaluation and management service by telephone and the availability of in person appointments. I also discussed with the patient that there may be a patient responsible charge related to this service. The patient expressed understanding and agreed to proceed.  Location patient: home, [place] Location provider: work or home office Participants present for the call: patient, provider, also spoke with Yvette Rack and friend brenda by phone Patient did not have a visit with me in the prior 7 days to address this/these issue(s).   History of Present Illness:  Acute telemedicine visit for sore throat and cough: -Onset: 07/24/20 -saw her PCP on the 27th and was given Avelox fo CAP per pt and notes - reports now is feeling much better with improved energy -she also has PCR COVID19 test pending -Symptoms include: cough, nasal congestion, sore throat -Denies:CP, SOB, inability to get out of bed or eat or drink, NVD -Has tried:improving on avelox per report -Pertinent past medical history:see chart -COVID-19 vaccine status: fully vaccinated + booster    Observations/Objective: Patient sounds cheerful and well on the phone. I do not appreciate any SOB. Speech and thought processing are grossly intact. Patient reported vitals:  Assessment and Plan:  Cough  -we discussed possible serious and likely etiologies, options for evaluation and workup, limitations of telemedicine visit vs in person visit, treatment, treatment risks and precautions. Pt prefers to treat via telemedicine empirically rather than in person at this moment.  Per report from patient and friends, she is improving on the Avelox.  She denies any severe symptoms at  this time.  A Covid test is pending.  Did advise if any worsening, new symptoms or not continuing to improve would need in person evaluation at urgent care clinic or the emergency room if severe symptoms.  Patient and caregiver is in agreement.  They agree to schedule follow-up if needed.  Advised of options for inperson care in case PCP office not available. Did let the patient know that I only do telemedicine shifts for [facility] on Tuesdays and Thursdays and advised a follow up visit with PCP or at an [facility] if has further questions or concerns.   Follow Up Instructions:  I did not refer this patient for an OV with me in the next 24 hours for this/these issue(s).  I discussed the assessment and treatment plan with the patient. The patient was provided an opportunity to ask questions and all were answered. The patient agreed with the plan and demonstrated an understanding of the instructions.   I spent 21 minutes on the date of this visit in the care of this patient. See summary of tasks completed to properly care for this patient in the detailed notes above which often include previsit review of recent office visit notes, review of PMH, medications, allergies, evaluation of the patient and counseling and ordering and instructing patient on testing and care options.     Terressa Koyanagi, DO

## 2020-07-31 NOTE — Telephone Encounter (Signed)
Patients POA said that the patient is refusing ED and said that there is no one to take her to urgent care.

## 2020-07-31 NOTE — Telephone Encounter (Signed)
Patients POA called and said that the patient is experiencing Cough, congestion, sinus pressure. She said that the patient was  diagnosed on 12.27 w/ community acquired  Pneumonia. Transferred to [facility].

## 2020-07-31 NOTE — Telephone Encounter (Signed)
Pt has a virtual visit scheduled for today

## 2020-08-03 ENCOUNTER — Encounter: Payer: Self-pay | Admitting: Family Medicine

## 2020-08-06 ENCOUNTER — Telehealth: Payer: Self-pay | Admitting: Emergency Medicine

## 2020-08-06 ENCOUNTER — Encounter: Payer: Medicare PPO | Admitting: Physician Assistant

## 2020-08-06 NOTE — Telephone Encounter (Signed)
Patient was scheduled today for video visit for cough. When I called to start visit patient states her cough was much better, she had finished her abx, and didn't want anymore medications. She has not had a cough today.  I also called patient POA Edrick Oh and left a detailed message advising patient declined appointment today, her cough had improved and she finished her abx and didn't want any additional mediation.

## 2020-08-10 NOTE — Progress Notes (Signed)
Erroneous encounter

## 2020-08-14 ENCOUNTER — Ambulatory Visit: Payer: Medicare PPO

## 2020-08-14 ENCOUNTER — Encounter: Payer: Self-pay | Admitting: Internal Medicine

## 2020-08-14 DIAGNOSIS — Z1211 Encounter for screening for malignant neoplasm of colon: Secondary | ICD-10-CM

## 2020-08-14 NOTE — Telephone Encounter (Signed)
Cologuard has been ordered. 

## 2020-08-19 ENCOUNTER — Ambulatory Visit: Payer: Medicare PPO | Admitting: Internal Medicine

## 2020-08-25 ENCOUNTER — Other Ambulatory Visit: Payer: Self-pay

## 2020-08-25 ENCOUNTER — Ambulatory Visit (INDEPENDENT_AMBULATORY_CARE_PROVIDER_SITE_OTHER): Payer: Medicare PPO | Admitting: Internal Medicine

## 2020-08-25 ENCOUNTER — Encounter: Payer: Self-pay | Admitting: Internal Medicine

## 2020-08-25 ENCOUNTER — Ambulatory Visit (INDEPENDENT_AMBULATORY_CARE_PROVIDER_SITE_OTHER): Payer: Medicare PPO

## 2020-08-25 VITALS — BP 136/86 | HR 93 | Temp 98.1°F | Resp 16 | Ht 65.0 in | Wt 199.0 lb

## 2020-08-25 DIAGNOSIS — K7581 Nonalcoholic steatohepatitis (NASH): Secondary | ICD-10-CM | POA: Diagnosis not present

## 2020-08-25 DIAGNOSIS — E118 Type 2 diabetes mellitus with unspecified complications: Secondary | ICD-10-CM | POA: Diagnosis not present

## 2020-08-25 DIAGNOSIS — I10 Essential (primary) hypertension: Secondary | ICD-10-CM

## 2020-08-25 DIAGNOSIS — J189 Pneumonia, unspecified organism: Secondary | ICD-10-CM

## 2020-08-25 DIAGNOSIS — J301 Allergic rhinitis due to pollen: Secondary | ICD-10-CM

## 2020-08-25 DIAGNOSIS — J449 Chronic obstructive pulmonary disease, unspecified: Secondary | ICD-10-CM | POA: Diagnosis not present

## 2020-08-25 MED ORDER — AZELASTINE HCL 0.1 % NA SOLN: 1.0000 | Freq: Two times a day (BID) | NASAL | 1 refills | Status: DC

## 2020-08-25 NOTE — Progress Notes (Signed)
Subjective:  Patient ID: [name], female    DOB: [birth year]  Age: 75 y.o. MRN: [id number]  CC: COPD and Diabetes  This visit occurred during the SARS-CoV-2 public health emergency.  Safety protocols were in place, including screening questions prior to the visit, additional usage of staff PPE, and extensive cleaning of exam room while observing appropriate contact time as indicated for disinfecting solutions.    HPI [name] presents for f/up - Her cough is improved.  She denies shortness of breath, wheezing, fever, chills, or hemoptysis.  She complains of runny nose.  Outpatient Medications Prior to Visit  Medication Sig Dispense Refill  . amLODipine (NORVASC) 5 MG tablet     . ARIPiprazole (ABILIFY) 5 MG tablet     . atorvastatin (LIPITOR) 40 MG tablet Take 1 tablet (40 mg total) by mouth daily. 90 tablet 1  . Cholecalciferol 125 MCG (5000 UT) capsule Take 1 capsule (5,000 Units total) by mouth daily. 90 capsule 1  . desvenlafaxine (PRISTIQ) 100 MG 24 hr tablet Take 100 mg by mouth daily.    Marland Kitchen JANUVIA 100 MG tablet     . levocetirizine (XYZAL) 5 MG tablet Take 1 tablet (5 mg total) by mouth every evening. 90 tablet 1  . lidocaine (LIDODERM) 5 % Place 1 patch onto the skin daily. Remove & Discard patch within 12 hours or as directed by MD 30 patch 5  . LORazepam (ATIVAN) 1 MG tablet Take 1 tablet (1 mg total) by mouth every 8 (eight) hours as needed for anxiety. 90 tablet 3  . mirabegron ER (MYRBETRIQ) 25 MG TB24 tablet Take 1 tablet (25 mg total) by mouth daily. 90 tablet 1  . pantoprazole (PROTONIX) 40 MG tablet Take 1 tablet (40 mg total) by mouth daily. 90 tablet 3  . pioglitazone (ACTOS) 15 MG tablet Take 1 tablet (15 mg total) by mouth daily. 90 tablet 1  . prazosin (MINIPRESS) 2 MG capsule Take 2 mg by mouth at bedtime.    . solifenacin (VESICARE) 5 MG tablet Take 1 tablet (5 mg total) by mouth daily. 90 tablet 1  . SPIRIVA HANDIHALER 18 MCG inhalation  capsule     . SUMAtriptan (IMITREX) 50 MG tablet Take 1 tablet (50 mg total) by mouth every 2 (two) hours as needed for migraine. May repeat in 2 hours if headache persists or recurs. 10 tablet 2  . zolpidem (AMBIEN) 5 MG tablet Take 1 tablet (5 mg total) by mouth at bedtime as needed for sleep. 90 tablet 0   No facility-administered medications prior to visit.    ROS Review of Systems  Constitutional: Negative.  Negative for diaphoresis, fatigue and unexpected weight change.  HENT: Positive for postnasal drip and rhinorrhea. Negative for congestion, facial swelling, nosebleeds, sinus pressure, sore throat, trouble swallowing and voice change.   Eyes: Negative.   Respiratory: Positive for cough. Negative for chest tightness, shortness of breath and wheezing.   Cardiovascular: Negative for chest pain, palpitations and leg swelling.  Gastrointestinal: Negative for abdominal pain, constipation, diarrhea, nausea and vomiting.  Endocrine: Negative.   Genitourinary: Negative.  Negative for difficulty urinating.  Musculoskeletal: Negative for arthralgias and myalgias.  Skin: Negative.   Neurological: Negative.  Negative for dizziness, weakness, light-headedness and numbness.  Hematological: Negative for adenopathy. Does not bruise/bleed easily.  Psychiatric/Behavioral: Negative.     Objective:  BP 136/86   Pulse 93   Temp 98.1 F (36.7 C) (Oral)   Resp 16  Ht 5\' 5"  (1.651 m)   Wt 199 lb (90.3 kg)   SpO2 95%   BMI 33.12 kg/m   BP Readings from Last 3 Encounters:  08/25/20 136/86  07/28/20 126/72  01/10/20 136/80    Wt Readings from Last 3 Encounters:  08/25/20 199 lb (90.3 kg)  07/28/20 204 lb (92.5 kg)  01/10/20 186 lb 8 oz (84.6 kg)    Physical Exam Vitals reviewed.  HENT:     Nose: Nose normal.     Mouth/Throat:     Mouth: Mucous membranes are moist.  Eyes:     General: No scleral icterus.    Conjunctiva/sclera: Conjunctivae normal.  Cardiovascular:     Rate and  Rhythm: Normal rate.     Heart sounds: No murmur heard.   Pulmonary:     Effort: Pulmonary effort is normal.     Breath sounds: No stridor. No wheezing, rhonchi or rales.  Abdominal:     General: Abdomen is protuberant. Bowel sounds are normal. There is no distension.     Palpations: Abdomen is soft. There is no hepatomegaly, splenomegaly or mass.     Tenderness: There is no abdominal tenderness.  Musculoskeletal:        General: Normal range of motion.     Cervical back: Neck supple.     Right lower leg: No edema.     Left lower leg: No edema.  Lymphadenopathy:     Cervical: No cervical adenopathy.  Skin:    General: Skin is warm and dry.  Neurological:     General: No focal deficit present.     Mental Status: She is alert.  Psychiatric:        Mood and Affect: Mood normal.        Behavior: Behavior normal.     Lab Results  Component Value Date   WBC 5.0 01/10/2020   HGB 14.7 01/10/2020   HCT 43.3 01/10/2020   PLT 195.0 01/10/2020   GLUCOSE 126 (H) 08/25/2020   CHOL 135 01/10/2020   TRIG 135.0 01/10/2020   HDL 47.00 01/10/2020   LDLDIRECT 154.0 07/04/2018   LDLCALC 61 01/10/2020   ALT 11 08/25/2020   AST 13 08/25/2020   NA 141 08/25/2020   K 3.7 08/25/2020   CL 104 08/25/2020   CREATININE 0.75 08/25/2020   BUN 17 08/25/2020   CO2 31 08/25/2020   TSH 1.84 01/10/2020   INR 1.13 07/20/2018   HGBA1C 6.0 08/25/2020   MICROALBUR 1.8 01/10/2020    MR Cervical Spine Wo Contrast  Result Date: 02/18/2020 CLINICAL DATA:  Neck pain. EXAM: MRI CERVICAL SPINE WITHOUT CONTRAST TECHNIQUE: Multiplanar, multisequence MR imaging of the cervical spine was performed. No intravenous contrast was administered. COMPARISON:  05/16/2019 CT cervical spine. 02/10/2014 MRI cervical spine. FINDINGS: Alignment: Straightening of cervical lordosis.  No listhesis. Vertebrae: Normal bone marrow signal intensity. No focal osseous lesion. Cord: Normal signal and morphology. Posterior Fossa,  vertebral arteries: Negative. Disc levels: Multilevel osteophytosis and desiccation with mild C4-7 disc space loss. C2-3: No significant disc bulge, spinal canal or neural foraminal narrowing. C3-4: Disc osteophyte complex with uncovertebral and bilateral facet hypertrophy. Mild spinal canal, mild right and moderate left neural foraminal narrowing. C4-5: Disc osteophyte complex with small superimposed right subarticular protrusion, uncovertebral and bilateral facet hypertrophy. Moderate spinal canal and bilateral neural foraminal narrowing. C5-6: Disc osteophyte complex with superimposed central protrusion abutting the ventral cord, uncovertebral and bilateral facet hypertrophy. Moderate spinal canal and severe bilateral neural foraminal narrowing.  C6-7: Disc osteophyte complex with superimposed left paracentral protrusion abutting the ventral cord, uncovertebral and bilateral facet hypertrophy. Moderate spinal canal, moderate right and mild left neural foraminal narrowing. C7-T1: No significant disc bulge, spinal canal or neural foraminal narrowing. Paraspinal tissues: Within normal limits. IMPRESSION: Moderate spinal canal narrowing at the C4-5, C5-6 and C6-7 levels. Severe bilateral C5-6 and moderate C4-5 neural foraminal narrowing. Moderate left C3-4 and right C6-7 neural foraminal narrowing. Electronically Signed   By: Primitivo Gauze M.D.   On: 02/18/2020 17:43   DG Chest 2 View  Result Date: 08/25/2020 CLINICAL DATA:  Follow-up pneumonia right lower lobe EXAM: CHEST - 2 VIEW COMPARISON:  07/28/2020 FINDINGS: There is hyperinflation of the lungs compatible with COPD. Heart is normal size. Stable patchy density at the right lung base, favor scarring at this time. Left lung clear. Heart is normal size. No effusions or acute bony abnormality. IMPRESSION: COPD. Patchy density at the right lung base again noted, favor scarring. Electronically Signed   By: Rolm Baptise M.D.   On: 08/25/2020 21:42     Assessment & Plan:   Amatullah was seen today for copd and diabetes.  Diagnoses and all orders for this visit:  Community acquired pneumonia of right lower lobe of lung- Her chest x-ray is negative for mass or infiltrate. -     DG Chest 2 View; Future  Essential hypertension- Her blood pressure is adequately well controlled. -     Basic metabolic panel; Future -     Basic metabolic panel  NASH (nonalcoholic steatohepatitis)- Improvement noted. Will continue pioglitazone. -     Hepatic function panel; Future -     Hepatic function panel  Type II diabetes mellitus with manifestations ([place])- Her blood sugars are adequately well controlled. -     Basic metabolic panel; Future -     Hemoglobin A1c; Future -     Hemoglobin A1c -     Basic metabolic panel  Seasonal allergic rhinitis due to pollen -     azelastine (ASTELIN) 0.1 % nasal spray; Place 1 spray into both nostrils 2 (two) times daily. Use in each nostril as directed   I am having [name] start on azelastine. I am also having her maintain her pantoprazole, prazosin, levocetirizine, SUMAtriptan, solifenacin, lidocaine, mirabegron ER, desvenlafaxine, LORazepam, atorvastatin, Cholecalciferol, pioglitazone, ARIPiprazole, amLODipine, Januvia, Spiriva HandiHaler, and zolpidem.  Meds ordered this encounter  Medications  . azelastine (ASTELIN) 0.1 % nasal spray    Sig: Place 1 spray into both nostrils 2 (two) times daily. Use in each nostril as directed    Dispense:  90 mL    Refill:  1     Follow-up: No follow-ups on file.  [name], MD

## 2020-08-26 ENCOUNTER — Encounter: Payer: Self-pay | Admitting: Internal Medicine

## 2020-08-26 LAB — BASIC METABOLIC PANEL
BUN: 17 mg/dL (ref 6–23)
CO2: 31 mEq/L (ref 19–32)
Calcium: 9.5 mg/dL (ref 8.4–10.5)
Chloride: 104 mEq/L (ref 96–112)
Creatinine, Ser: 0.75 mg/dL (ref 0.40–1.20)
GFR: 78.91 mL/min (ref >60.00)
Glucose, Bld: 126 mg/dL — ABNORMAL HIGH (ref 70–99)
Potassium: 3.7 mEq/L (ref 3.5–5.1)
Sodium: 141 mEq/L (ref 135–145)

## 2020-08-26 LAB — HEPATIC FUNCTION PANEL
ALT: 11 U/L (ref 0–35)
AST: 13 U/L (ref 0–37)
Albumin: 4 g/dL (ref 3.5–5.2)
Alkaline Phosphatase: 77 U/L (ref 39–117)
Bilirubin, Direct: 0.1 mg/dL (ref 0.0–0.3)
Total Bilirubin: 0.7 mg/dL (ref 0.2–1.2)
Total Protein: 7.2 g/dL (ref 6.0–8.3)

## 2020-08-26 LAB — HEMOGLOBIN A1C: Hgb A1c MFr Bld: 6 % (ref 4.6–6.5)

## 2020-08-27 ENCOUNTER — Encounter: Payer: Self-pay | Admitting: Internal Medicine

## 2020-08-27 NOTE — Patient Instructions (Signed)

## 2020-08-29 ENCOUNTER — Encounter: Payer: Self-pay | Admitting: Internal Medicine

## 2020-09-02 DIAGNOSIS — H31011 Macula scars of posterior pole (postinflammatory) (post-traumatic), right eye: Secondary | ICD-10-CM | POA: Diagnosis not present

## 2020-09-02 DIAGNOSIS — H35341 Macular cyst, hole, or pseudohole, right eye: Secondary | ICD-10-CM | POA: Diagnosis not present

## 2020-09-04 ENCOUNTER — Ambulatory Visit: Payer: Medicare PPO | Admitting: Internal Medicine

## 2020-09-05 ENCOUNTER — Other Ambulatory Visit: Payer: Self-pay | Admitting: Internal Medicine

## 2020-09-05 DIAGNOSIS — J410 Simple chronic bronchitis: Secondary | ICD-10-CM

## 2020-09-05 DIAGNOSIS — I10 Essential (primary) hypertension: Secondary | ICD-10-CM

## 2020-09-05 MED ORDER — SPIRIVA RESPIMAT 2.5 MCG/ACT IN AERS: 2.0000 | INHALATION_SPRAY | Freq: Every day | RESPIRATORY_TRACT | 1 refills | Status: AC

## 2020-09-05 MED ORDER — AMLODIPINE BESYLATE 5 MG PO TABS: 5.0000 mg | ORAL_TABLET | Freq: Every day | ORAL | 1 refills | Status: DC

## 2020-09-18 ENCOUNTER — Encounter (HOSPITAL_COMMUNITY): Payer: Self-pay | Admitting: Emergency Medicine

## 2020-09-18 ENCOUNTER — Emergency Department (HOSPITAL_COMMUNITY)
Admission: EM | Admit: 2020-09-18 | Discharge: 2020-09-19 | Disposition: A | Discharge status: Home / Self Care | Payer: Medicare PPO | Attending: Emergency Medicine | Admitting: Emergency Medicine

## 2020-09-18 DIAGNOSIS — K5900 Constipation, unspecified: Secondary | ICD-10-CM | POA: Insufficient documentation

## 2020-09-18 DIAGNOSIS — Z982 Presence of cerebrospinal fluid drainage device: Secondary | ICD-10-CM | POA: Diagnosis not present

## 2020-09-18 DIAGNOSIS — G9389 Other specified disorders of brain: Secondary | ICD-10-CM | POA: Diagnosis not present

## 2020-09-18 DIAGNOSIS — B9689 Other specified bacterial agents as the cause of diseases classified elsewhere: Secondary | ICD-10-CM | POA: Insufficient documentation

## 2020-09-18 DIAGNOSIS — N390 Urinary tract infection, site not specified: Secondary | ICD-10-CM | POA: Diagnosis not present

## 2020-09-18 DIAGNOSIS — I639 Cerebral infarction, unspecified: Secondary | ICD-10-CM | POA: Diagnosis not present

## 2020-09-18 DIAGNOSIS — E119 Type 2 diabetes mellitus without complications: Secondary | ICD-10-CM | POA: Diagnosis not present

## 2020-09-18 DIAGNOSIS — I10 Essential (primary) hypertension: Secondary | ICD-10-CM | POA: Insufficient documentation

## 2020-09-18 DIAGNOSIS — R112 Nausea with vomiting, unspecified: Secondary | ICD-10-CM | POA: Diagnosis not present

## 2020-09-18 DIAGNOSIS — Z743 Need for continuous supervision: Secondary | ICD-10-CM | POA: Diagnosis not present

## 2020-09-18 DIAGNOSIS — R930 Abnormal findings on diagnostic imaging of skull and head, not elsewhere classified: Secondary | ICD-10-CM | POA: Diagnosis not present

## 2020-09-18 DIAGNOSIS — R1110 Vomiting, unspecified: Secondary | ICD-10-CM | POA: Diagnosis not present

## 2020-09-18 NOTE — ED Triage Notes (Signed)
Pt BIB EMS from [place] at [place] living facility  c/o N/V since 1130 am today. ~12 emesis episodes. Denies abd pain. Tachy.

## 2020-09-18 NOTE — ED Provider Notes (Signed)
[facility] Emergency Department Provider Note MRN:  [id number]  Arrival date & time: 09/19/20     Chief Complaint   Nausea and Emesis   History of Present Illness   [name] is a 75 y.o. year-old female with a history of diabetes, hypertension presenting to the ED with chief complaint of emesis.  12+ episodes of nonbloody nonbilious emesis coming from care facility today.  Denies any pain, no chest pain, no abdominal pain.  No bowel movements today, not passing gas today.  History of cholecystectomy.  Denies fever.  No other complaints.  Review of Systems  A complete 10 system review of systems was obtained and all systems are negative except as noted in the HPI and PMH.   Patient's Health History    Past Medical History:  Diagnosis Date  . Acid reflux disease   . Acute kidney injury ([place])   . Anxiety   . Chronic bronchitis ([place])   . Depression   . Diabetes mellitus without complication ([place])   . Dizziness   . Falls   . High cholesterol   . Hydrocephalus ([place])   . Hypertension   . Hypotension   . MVP (mitral valve prolapse)   . Sinus bradycardia   . Transaminitis     Past Surgical History:  Procedure Laterality Date  . BRAIN SURGERY    . BREAST BIOPSY Left   . CHOLECYSTECTOMY    . ERCP N/A 02/28/2014   Procedure: ENDOSCOPIC RETROGRADE CHOLANGIOPANCREATOGRAPHY (ERCP);  Surgeon: Gatha Mayer, MD;  Location: [facility] ENDOSCOPY;  Service: Endoscopy;  Laterality: N/A;  talked to tiffany from or /ebp  . ESOPHAGOGASTRODUODENOSCOPY N/A 02/28/2014   Procedure: ESOPHAGOGASTRODUODENOSCOPY (EGD);  Surgeon: Gatha Mayer, MD;  Location: [facility] ENDOSCOPY;  Service: Endoscopy;  Laterality: N/A;  . VENTRICULOPERITONEAL SHUNT     right occipital    Family History  Problem Relation Age of Onset  . Heart disease Mother   . Stroke Mother   . Hypertension Mother   . Breast cancer Mother   . Alcohol abuse Father   . Diabetes Father   . Breast cancer  Maternal Aunt   . Breast cancer Maternal Grandmother   . Cancer Neg Hx     Social History   Socioeconomic History  . Marital status: Divorced    Spouse name: Not on file  . Number of children: Not on file  . Years of education: Not on file  . Highest education level: Not on file  Occupational History  . Not on file  Tobacco Use  . Smoking status: Never Smoker  . Smokeless tobacco: Never Used  Vaping Use  . Vaping Use: Never used  Substance and Sexual Activity  . Alcohol use: No  . Drug use: No  . Sexual activity: Not Currently  Other Topics Concern  . Not on file  Social History Narrative   Lives at [facility].  Lives with cat/ Maggie.  Education HS.  Children none.  Divorced.  Retired.   Social Determinants of Health   Financial Resource Strain: Not on file  Food Insecurity: Not on file  Transportation Needs: Not on file  Physical Activity: Not on file  Stress: Not on file  Social Connections: Not on file  Intimate Partner Violence: Not on file     Physical Exam   Vitals:   09/19/20 0230 09/19/20 0245  BP: (!) 117/93 131/81  Pulse:  96  Resp: 15 16  Temp:    SpO2:  96%    CONSTITUTIONAL: Well-appearing, NAD NEURO:  Alert and oriented x 3, no focal deficits EYES:  eyes equal and reactive ENT/NECK:  no LAD, no JVD CARDIO: Regular rate, well-perfused, normal S1 and S2 PULM:  CTAB no wheezing or rhonchi GI/GU:  normal bowel sounds, non-distended, non-tender MSK/SPINE:  No gross deformities, no edema SKIN:  no rash, atraumatic PSYCH:  Appropriate speech and behavior  *Additional and/or pertinent findings included in MDM below  Diagnostic and Interventional Summary    EKG Interpretation  Date/Time:  Thursday September 18 2020 22:32:08 EST Ventricular Rate:  99 PR Interval:    QRS Duration: 94 QT Interval:  358 QTC Calculation: 460 R Axis:     Text Interpretation: Sinus rhythm Confirmed by Gerlene Fee [phone]) on 09/18/2020 11:53:37 PM       Labs Reviewed  CBC - Abnormal; Notable for the following components:      Result Value   Hemoglobin 15.2 (*)    All other components within normal limits  COMPREHENSIVE METABOLIC PANEL - Abnormal; Notable for the following components:   Glucose, Bld 195 (*)    BUN 28 (*)    Total Bilirubin 1.3 (*)    All other components within normal limits  URINALYSIS, ROUTINE W REFLEX MICROSCOPIC - Abnormal; Notable for the following components:   APPearance HAZY (*)    Nitrite POSITIVE (*)    Leukocytes,Ua MODERATE (*)    Bacteria, UA MANY (*)    All other components within normal limits  LIPASE, BLOOD    CT ABDOMEN PELVIS W CONTRAST  Final Result    CT HEAD WO CONTRAST  Final Result      Medications  iohexol (OMNIPAQUE) 300 MG/ML solution 100 mL (100 mLs Intravenous Contrast Given 09/19/20 0114)  fentaNYL (SUBLIMAZE) injection 25 mcg (25 mcg Intravenous Given 09/19/20 0221)  cefTRIAXone (ROCEPHIN) 2 g in sodium chloride 0.9 % 100 mL IVPB (0 g Intravenous Stopped 09/19/20 0258)  ondansetron (ZOFRAN) injection 4 mg (4 mg Intravenous Given 09/19/20 0221)     Procedures  /  Critical Care Procedures  ED Course and Medical Decision Making  I have reviewed the triage vital signs, the nursing notes, and pertinent available records from the EMR.  Listed above are laboratory and imaging tests that I personally ordered, reviewed, and interpreted and then considered in my medical decision making (see below for details).  Considering SBO, also considering CNS cause of patient's persistent emesis given lack of abdominal distention or tenderness.  Labs, CT pending.     CT is overall reassuring, there is evidence of constipation versus fecal impaction.  Disimpaction attempted as described above.  Also evidence of UTI.  Overall well-appearing, normal vital signs, soft abdomen, alert and oriented x3, appropriate for discharge on antibiotics and bowel regimen.  Barth Kirks. Sedonia Small, [place] [email or web address]  Final Clinical Impressions(s) / ED Diagnoses     ICD-10-CM   1. Constipation, unspecified constipation type  K59.00   2. Lower urinary tract infectious disease  N39.0     ED Discharge Orders         Ordered    cephALEXin (KEFLEX) 500 MG capsule  3 times daily        09/19/20 0313    polyethylene glycol (MIRALAX) 17 g packet  Daily        09/19/20 0313    bisacodyl (DULCOLAX) 10 MG suppository  As needed        09/19/20  0313           Discharge Instructions Discussed with and Provided to Patient:     Discharge Instructions     You were evaluated in the Emergency Department and after careful evaluation, we did not find any emergent condition requiring admission or further testing in the hospital.  Your exam/testing today is overall reassuring.  Symptoms seem to be due to constipation as well as urinary tract infection.  Please take the Keflex antibiotics as directed.  Use the MiraLAX medication up to 4 times daily.  Use the Dulcolax suppositories as needed until you achieve a bowel movement.  Could also use enemas to help relieve the constipation.  Please return to the Emergency Department if you experience any worsening of your condition.   Thank you for allowing [place] to be a part of your care.       Maudie Flakes, MD 09/19/20 [phone]

## 2020-09-19 ENCOUNTER — Emergency Department (HOSPITAL_COMMUNITY): Payer: Medicare PPO

## 2020-09-19 ENCOUNTER — Encounter (HOSPITAL_COMMUNITY): Payer: Self-pay

## 2020-09-19 ENCOUNTER — Encounter: Payer: Self-pay | Admitting: Internal Medicine

## 2020-09-19 DIAGNOSIS — Z743 Need for continuous supervision: Secondary | ICD-10-CM | POA: Diagnosis not present

## 2020-09-19 DIAGNOSIS — R112 Nausea with vomiting, unspecified: Secondary | ICD-10-CM | POA: Diagnosis not present

## 2020-09-19 DIAGNOSIS — R1111 Vomiting without nausea: Secondary | ICD-10-CM | POA: Diagnosis not present

## 2020-09-19 DIAGNOSIS — R1110 Vomiting, unspecified: Secondary | ICD-10-CM | POA: Diagnosis not present

## 2020-09-19 DIAGNOSIS — R110 Nausea: Secondary | ICD-10-CM | POA: Diagnosis not present

## 2020-09-19 DIAGNOSIS — R930 Abnormal findings on diagnostic imaging of skull and head, not elsewhere classified: Secondary | ICD-10-CM | POA: Diagnosis not present

## 2020-09-19 DIAGNOSIS — R279 Unspecified lack of coordination: Secondary | ICD-10-CM | POA: Diagnosis not present

## 2020-09-19 DIAGNOSIS — G9389 Other specified disorders of brain: Secondary | ICD-10-CM | POA: Diagnosis not present

## 2020-09-19 DIAGNOSIS — I639 Cerebral infarction, unspecified: Secondary | ICD-10-CM | POA: Diagnosis not present

## 2020-09-19 LAB — URINALYSIS, ROUTINE W REFLEX MICROSCOPIC
Bilirubin Urine: NEGATIVE
Glucose, UA: NEGATIVE mg/dL
Hgb urine dipstick: NEGATIVE
Ketones, ur: NEGATIVE mg/dL
Nitrite: POSITIVE — AB
Protein, ur: NEGATIVE mg/dL
Specific Gravity, Urine: 1.02 (ref 1.005–1.030)
pH: 5 (ref 5.0–8.0)

## 2020-09-19 LAB — LIPASE, BLOOD: Lipase: 20 U/L (ref 11–51)

## 2020-09-19 LAB — CBC
HCT: 45.2 % (ref 36.0–46.0)
Hemoglobin: 15.2 g/dL — ABNORMAL HIGH (ref 12.0–15.0)
MCH: 32.3 pg (ref 26.0–34.0)
MCHC: 33.6 g/dL (ref 30.0–36.0)
MCV: 96 fL (ref 80.0–100.0)
Platelets: 161 10*3/uL (ref 150–400)
RBC: 4.71 MIL/uL (ref 3.87–5.11)
RDW: 12.4 % (ref 11.5–15.5)
WBC: 8.3 10*3/uL (ref 4.0–10.5)
nRBC: 0 % (ref 0.0–0.2)

## 2020-09-19 LAB — COMPREHENSIVE METABOLIC PANEL
ALT: 16 U/L (ref 0–44)
AST: 17 U/L (ref 15–41)
Albumin: 3.8 g/dL (ref 3.5–5.0)
Alkaline Phosphatase: 67 U/L (ref 38–126)
Anion gap: 10 (ref 5–15)
BUN: 28 mg/dL — ABNORMAL HIGH (ref 8–23)
CO2: 26 mmol/L (ref 22–32)
Calcium: 9 mg/dL (ref 8.9–10.3)
Chloride: 102 mmol/L (ref 98–111)
Creatinine, Ser: 0.86 mg/dL (ref 0.44–1.00)
GFR, Estimated: >60 mL/min (ref >60)
Glucose, Bld: 195 mg/dL — ABNORMAL HIGH (ref 70–99)
Potassium: 3.7 mmol/L (ref 3.5–5.1)
Sodium: 138 mmol/L (ref 135–145)
Total Bilirubin: 1.3 mg/dL — ABNORMAL HIGH (ref 0.3–1.2)
Total Protein: 7.1 g/dL (ref 6.5–8.1)

## 2020-09-19 MED ORDER — POLYETHYLENE GLYCOL 3350 17 G PO PACK: 17.0000 g | PACK | Freq: Every day | ORAL | 1 refills | Status: DC

## 2020-09-19 MED ORDER — CEPHALEXIN 500 MG PO CAPS: 500.0000 mg | ORAL_CAPSULE | Freq: Three times a day (TID) | ORAL | 0 refills | Status: AC

## 2020-09-19 MED ORDER — BISACODYL 10 MG RE SUPP: 10.0000 mg | RECTAL | 0 refills | Status: DC | PRN

## 2020-09-19 MED ORDER — FENTANYL CITRATE (PF) 100 MCG/2ML IJ SOLN
25.0000 ug | Freq: Once | INTRAMUSCULAR | Status: AC
  Administered 2020-09-19: 25 ug via INTRAVENOUS
  Filled 2020-09-19: qty 2

## 2020-09-19 MED ORDER — SODIUM CHLORIDE 0.9 % IV SOLN
2.0000 g | Freq: Once | INTRAVENOUS | Status: AC
  Administered 2020-09-19: 2 g via INTRAVENOUS
  Filled 2020-09-19: qty 20

## 2020-09-19 MED ORDER — ONDANSETRON HCL 4 MG/2ML IJ SOLN
4.0000 mg | Freq: Once | INTRAMUSCULAR | Status: AC
  Administered 2020-09-19: 4 mg via INTRAVENOUS
  Filled 2020-09-19: qty 2

## 2020-09-19 MED ORDER — IOHEXOL 300 MG/ML  SOLN
100.0000 mL | Freq: Once | INTRAMUSCULAR | Status: AC | PRN
  Administered 2020-09-19: 100 mL via INTRAVENOUS

## 2020-09-19 NOTE — ED Notes (Signed)
[facility] contacted for transport 

## 2020-09-19 NOTE — Discharge Instructions (Addendum)
You were evaluated in the Emergency Department and after careful evaluation, we did not find any emergent condition requiring admission or further testing in the hospital.  Your exam/testing today is overall reassuring.  Symptoms seem to be due to constipation as well as urinary tract infection.  Please take the Keflex antibiotics as directed.  Use the MiraLAX medication up to 4 times daily.  Use the Dulcolax suppositories as needed until you achieve a bowel movement.  Could also use enemas to help relieve the constipation.  Please return to the Emergency Department if you experience any worsening of your condition.   Thank you for allowing [place] to be a part of your care.

## 2020-09-24 ENCOUNTER — Telehealth: Payer: Self-pay | Admitting: Internal Medicine

## 2020-09-24 ENCOUNTER — Other Ambulatory Visit: Payer: Self-pay | Admitting: Internal Medicine

## 2020-09-24 DIAGNOSIS — Z1211 Encounter for screening for malignant neoplasm of colon: Secondary | ICD-10-CM | POA: Diagnosis not present

## 2020-09-24 DIAGNOSIS — K5904 Chronic idiopathic constipation: Secondary | ICD-10-CM | POA: Insufficient documentation

## 2020-09-24 LAB — COLOGUARD: Cologuard: POSITIVE — AB

## 2020-09-24 MED ORDER — TRULANCE 3 MG PO TABS: 1.0000 | ORAL_TABLET | Freq: Every day | ORAL | 1 refills | Status: DC

## 2020-09-24 NOTE — Telephone Encounter (Signed)
    When the patient went to the ER the doctor wrote for her to take miralax daily for up to 4 times a day. Patient isn't able to take it with water because it makes her gag. [facility] wont let her take it any other way because they need orders if she needs to take it another way so the family is trying to see if we can send in an order for it to be in another form of liquid that might be more tolerable like orange juice, apple juice, apple sauce. Patients POA would like a call when we send those orders to [facility].

## 2020-09-24 NOTE — Telephone Encounter (Signed)
[name] has been informed and expressed understanding.

## 2020-09-24 NOTE — Telephone Encounter (Signed)
Try trulance instead RX sent

## 2020-09-25 ENCOUNTER — Other Ambulatory Visit: Payer: Self-pay

## 2020-09-25 ENCOUNTER — Ambulatory Visit
Admission: RE | Admit: 2020-09-25 | Discharge: 2020-09-25 | Disposition: A | Discharge status: Home / Self Care | Payer: Medicare PPO | Source: Ambulatory Visit | Attending: Internal Medicine | Admitting: Internal Medicine

## 2020-09-25 DIAGNOSIS — Z1231 Encounter for screening mammogram for malignant neoplasm of breast: Secondary | ICD-10-CM

## 2020-09-26 NOTE — Telephone Encounter (Signed)
Melissa w/ [facility] called and said that the order to d/c  Miralax  and her assessment have been faxed to the office and they are needing a signature and have it faxed back. Please advise   Phone: [phone] Fax: [phone]

## 2020-09-26 NOTE — Telephone Encounter (Signed)
Forms given to PCP to sign and date

## 2020-09-26 NOTE — Telephone Encounter (Signed)
Forms have been faxed back.  

## 2020-09-29 LAB — HM MAMMOGRAPHY

## 2020-10-01 LAB — COLOGUARD
COLOGUARD: POSITIVE — AB
Cologuard: POSITIVE — AB

## 2020-10-03 ENCOUNTER — Other Ambulatory Visit: Payer: Self-pay | Admitting: Internal Medicine

## 2020-10-03 DIAGNOSIS — R195 Other fecal abnormalities: Secondary | ICD-10-CM | POA: Insufficient documentation

## 2020-10-08 ENCOUNTER — Encounter: Payer: Self-pay | Admitting: Internal Medicine

## 2020-10-08 ENCOUNTER — Other Ambulatory Visit: Payer: Self-pay

## 2020-10-08 ENCOUNTER — Ambulatory Visit: Payer: Medicare PPO | Admitting: Internal Medicine

## 2020-10-08 VITALS — BP 124/78 | HR 95 | Temp 97.9°F | Ht 65.0 in | Wt 199.0 lb

## 2020-10-08 DIAGNOSIS — I10 Essential (primary) hypertension: Secondary | ICD-10-CM

## 2020-10-08 DIAGNOSIS — K5904 Chronic idiopathic constipation: Secondary | ICD-10-CM | POA: Diagnosis not present

## 2020-10-08 NOTE — Patient Instructions (Signed)

## 2020-10-08 NOTE — Progress Notes (Addendum)
Subjective:  Patient ID: [name], female    DOB: 06-Feb-1946  Age: 75 y.o. MRN: [id number]  CC: Hypertension  This visit occurred during the SARS-CoV-2 public health emergency.  Safety protocols were in place, including screening questions prior to the visit, additional usage of staff PPE, and extensive cleaning of exam room while observing appropriate contact time as indicated for disinfecting solutions.    HPI [name] presents for f/up - She continues to c/o constipation. She thinks she is taking trulance.  Outpatient Medications Prior to Visit  Medication Sig Dispense Refill  . ARIPiprazole (ABILIFY) 5 MG tablet Take 5 mg by mouth at bedtime. (2100)    . atorvastatin (LIPITOR) 40 MG tablet Take 1 tablet (40 mg total) by mouth daily. (Patient taking differently: Take 40 mg by mouth every evening. (2100)) 90 tablet 1  . Cholecalciferol 125 MCG (5000 UT) capsule Take 1 capsule (5,000 Units total) by mouth daily. (Patient taking differently: Take 5,000 Units by mouth in the morning. (0800)) 90 capsule 1  . desvenlafaxine (PRISTIQ) 100 MG 24 hr tablet Take 100 mg by mouth every evening. (2100)    . lidocaine (LIDODERM) 5 % Place 1 patch onto the skin daily. Remove & Discard patch within 12 hours or as directed by MD (Patient taking differently: Place 1 patch onto the skin every 12 (twelve) hours as needed (pain). Remove & Discard patch within 12 hours or as directed by MD) 30 patch 5  . LORazepam (ATIVAN) 1 MG tablet Take 1 tablet (1 mg total) by mouth every 8 (eight) hours as needed for anxiety. 90 tablet 3  . mirabegron ER (MYRBETRIQ) 25 MG TB24 tablet Take 1 tablet (25 mg total) by mouth daily. (Patient taking differently: Take 25 mg by mouth daily. (0900)) 90 tablet 1  . pantoprazole (PROTONIX) 40 MG tablet Take 1 tablet (40 mg total) by mouth daily. (Patient taking differently: Take 40 mg by mouth in the morning. (0800)) 90 tablet 3  . pioglitazone (ACTOS) 15 MG tablet  Take 1 tablet (15 mg total) by mouth daily. (Patient taking differently: Take 15 mg by mouth daily. (0800)) 90 tablet 1  . prazosin (MINIPRESS) 2 MG capsule Take 2 mg by mouth at bedtime. (2100)    . solifenacin (VESICARE) 5 MG tablet Take 1 tablet (5 mg total) by mouth daily. (Patient taking differently: Take 5 mg by mouth in the morning. (0800)) 90 tablet 1  . SUMAtriptan (IMITREX) 50 MG tablet Take 1 tablet (50 mg total) by mouth every 2 (two) hours as needed for migraine. May repeat in 2 hours if headache persists or recurs. 10 tablet 2  . Tiotropium Bromide Monohydrate (SPIRIVA RESPIMAT) 2.5 MCG/ACT AERS Inhale 2 puffs into the lungs daily. (Patient taking differently: Inhale 2 puffs into the lungs daily. (0900)) 12 g 1  . zolpidem (AMBIEN) 5 MG tablet Take 1 tablet (5 mg total) by mouth at bedtime as needed for sleep. (Patient taking differently: Take 5 mg by mouth at bedtime as needed (insomnia).) 90 tablet 0  . amLODipine (NORVASC) 5 MG tablet Take 1 tablet (5 mg total) by mouth daily. 90 tablet 1  . azelastine (ASTELIN) 0.1 % nasal spray Place 1 spray into both nostrils 2 (two) times daily. Use in each nostril as directed (Patient taking differently: Place 1 spray into both nostrils 2 (two) times daily. (0900 & 2100) Use in each nostril as directed) 90 mL 1  . bisacodyl (DULCOLAX) 10 MG suppository Place  1 suppository (10 mg total) rectally as needed for moderate constipation. 12 suppository 0  . levocetirizine (XYZAL) 5 MG tablet Take 1 tablet (5 mg total) by mouth every evening. (Patient taking differently: Take 5 mg by mouth every evening. (2100)) 90 tablet 1  . Plecanatide (TRULANCE) 3 MG TABS Take 1 tablet by mouth daily. (Patient taking differently: Take 3 mg by mouth daily. (0900)) 90 tablet 1  . polyethylene glycol (MIRALAX) 17 g packet Take 17 g by mouth daily. 30 each 1   No facility-administered medications prior to visit.    ROS Review of Systems  Constitutional: Negative.   Negative for diaphoresis and fatigue.  HENT: Negative.   Eyes: Negative for visual disturbance.  Respiratory: Negative for cough, chest tightness, shortness of breath and wheezing.   Cardiovascular: Negative for chest pain, palpitations and leg swelling.  Gastrointestinal: Positive for constipation. Negative for abdominal pain, diarrhea and nausea.  Endocrine: Negative.   Genitourinary: Negative.  Negative for difficulty urinating.  Musculoskeletal: Negative.  Negative for back pain.  Skin: Negative.  Negative for color change.  Neurological: Negative.  Negative for dizziness and weakness.  Hematological: Negative for adenopathy. Does not bruise/bleed easily.  Psychiatric/Behavioral: Negative.     Objective:  BP 124/78   Pulse 95   Temp 97.9 F (36.6 C) (Oral)   Ht 5\' 5"  (1.651 m)   Wt 199 lb (90.3 kg)   SpO2 96%   BMI 33.12 kg/m   BP Readings from Last 3 Encounters:  10/24/20 106/70  10/08/20 124/78  09/19/20 (!) 144/78    Wt Readings from Last 3 Encounters:  10/24/20 205 lb 14.6 oz (93.4 kg)  10/08/20 199 lb (90.3 kg)  09/18/20 199 lb 1.2 oz (90.3 kg)    Physical Exam HENT:     Nose: Nose normal.  Eyes:     General: No scleral icterus.    Conjunctiva/sclera: Conjunctivae normal.  Cardiovascular:     Rate and Rhythm: Normal rate and regular rhythm.     Heart sounds: No murmur heard.   Pulmonary:     Effort: Pulmonary effort is normal.     Breath sounds: No stridor. No wheezing, rhonchi or rales.  Abdominal:     General: Abdomen is protuberant. Bowel sounds are normal. There is no distension.     Palpations: Abdomen is soft. There is no hepatomegaly, splenomegaly or mass.  Musculoskeletal:        General: Normal range of motion.     Cervical back: Neck supple.     Right lower leg: No edema.     Left lower leg: No edema.  Lymphadenopathy:     Cervical: No cervical adenopathy.  Skin:    General: Skin is warm and dry.     Coloration: Skin is not pale.   Neurological:     General: No focal deficit present.     Mental Status: She is alert.  Psychiatric:        Mood and Affect: Mood normal.        Behavior: Behavior normal.     Lab Results  Component Value Date   WBC 8.3 09/18/2020   HGB 15.2 (H) 09/18/2020   HCT 45.2 09/18/2020   PLT 161 09/18/2020   GLUCOSE 195 (H) 09/18/2020   CHOL 135 01/10/2020   TRIG 135.0 01/10/2020   HDL 47.00 01/10/2020   LDLDIRECT 154.0 07/04/2018   LDLCALC 61 01/10/2020   ALT 16 09/18/2020   AST 17 09/18/2020   NA 138  09/18/2020   K 3.7 09/18/2020   CL 102 09/18/2020   CREATININE 0.86 09/18/2020   BUN 28 (H) 09/18/2020   CO2 26 09/18/2020   TSH 1.78 10/08/2020   INR 1.13 07/20/2018   HGBA1C 6.0 08/25/2020   MICROALBUR 1.8 01/10/2020    MM 3D SCREEN BREAST BILATERAL  Result Date: 09/29/2020 CLINICAL DATA:  Screening. EXAM: DIGITAL SCREENING BILATERAL MAMMOGRAM WITH TOMOSYNTHESIS AND CAD TECHNIQUE: Bilateral screening digital craniocaudal and mediolateral oblique mammograms were obtained. Bilateral screening digital breast tomosynthesis was performed. The images were evaluated with computer-aided detection. COMPARISON:  Previous exam(s). ACR Breast Density Category b: There are scattered areas of fibroglandular density. FINDINGS: There are no findings suspicious for malignancy. IMPRESSION: No mammographic evidence of malignancy. A result letter of this screening mammogram will be mailed directly to the patient. RECOMMENDATION: Screening mammogram in one year. (Code:SM-B-01Y) BI-RADS CATEGORY  1: Negative. Electronically Signed   By: Lajean Manes M.D.   On: 09/29/2020 09:11    Assessment & Plan:   Jolie was seen today for hypertension.  Diagnoses and all orders for this visit:  Essential hypertension- Her BP is over-controlled and she complains of constipation. Will discontinue the CCB. -     TSH; Future -     TSH  Chronic idiopathic constipation- Labs are negative for secondary causes. Will  d'c the CCB. Cont trulance. -     TSH; Future -     TSH   I have discontinued Karen Chafe. Mohiuddin's amLODipine, polyethylene glycol, and bisacodyl. I am also having her maintain her pantoprazole, prazosin, SUMAtriptan, solifenacin, lidocaine, mirabegron ER, desvenlafaxine, LORazepam, atorvastatin, Cholecalciferol, pioglitazone, ARIPiprazole, zolpidem, and Spiriva Respimat.  No orders of the defined types were placed in this encounter.    Follow-up: Return in about 3 months (around 01/08/2021).  [name], MD

## 2020-10-09 ENCOUNTER — Encounter: Payer: Self-pay | Admitting: Internal Medicine

## 2020-10-09 LAB — TSH: TSH: 1.78 u[IU]/mL (ref 0.35–4.50)

## 2020-10-13 DIAGNOSIS — G919 Hydrocephalus, unspecified: Secondary | ICD-10-CM | POA: Diagnosis not present

## 2020-10-13 DIAGNOSIS — K219 Gastro-esophageal reflux disease without esophagitis: Secondary | ICD-10-CM | POA: Diagnosis not present

## 2020-10-13 DIAGNOSIS — R195 Other fecal abnormalities: Secondary | ICD-10-CM | POA: Diagnosis not present

## 2020-10-15 ENCOUNTER — Other Ambulatory Visit: Payer: Self-pay | Admitting: Gastroenterology

## 2020-10-17 NOTE — Progress Notes (Addendum)
Preop instructions for:     [name] Date of Birth:    September 15, 1945                   Date of Procedure:  10/24/2020  Procedure:     COLONOSCOPY WITH PROPOFOL Surgeon: Carol Ada Dr. Facility contact:[facility]     Phone:  [phone]                Health Care POA: Adria Devon [phone] RN contact name/phone#:     Kirke Shaggy                    and Fax #: [phone]   Transportation contact phone#:  Nanine Means accompanied with Adria Devon    Time to arrive at [facility]: 12 Noon   Report to: Admitting (On your left hand side)    Do not eat or drink past midnight the night before your procedure.(To include any tube feedings-must be discontinued)   Take these morning medications only with sips of water.(or give through gastrostomy or feeding tube). Pantoprazole, Vesicare, Myrbetriq   Please send day of procedure:current med list and meds last taken that day, confirm nothing by mouth status from what time, Patient Demographic info( to include DNR status, problem list, allergies)   Bring Insurance card and picture ID Leave all jewelry and other valuables at place where living( no metal or rings to be worn) No contact lens Women-no make-up, no lotions,perfumes,powders  Any questions day of procedure,call  SHORT [phone]     Sent from :[facility]                   Phone:[phone]                   Fax:[phone]   Sent by : Harlon Flor BSN              RN

## 2020-10-21 ENCOUNTER — Other Ambulatory Visit (HOSPITAL_COMMUNITY)
Admission: RE | Admit: 2020-10-21 | Discharge: 2020-10-21 | Disposition: A | Discharge status: Home / Self Care | Payer: Medicare PPO | Source: Ambulatory Visit | Attending: Gastroenterology | Admitting: Gastroenterology

## 2020-10-21 DIAGNOSIS — Z01812 Encounter for preprocedural laboratory examination: Secondary | ICD-10-CM | POA: Diagnosis not present

## 2020-10-21 DIAGNOSIS — Z20822 Contact with and (suspected) exposure to covid-19: Secondary | ICD-10-CM | POA: Insufficient documentation

## 2020-10-21 LAB — SARS CORONAVIRUS 2 (TAT 6-24 HRS): SARS Coronavirus 2: NEGATIVE

## 2020-10-23 ENCOUNTER — Other Ambulatory Visit: Payer: Self-pay

## 2020-10-24 ENCOUNTER — Other Ambulatory Visit: Payer: Self-pay

## 2020-10-24 ENCOUNTER — Ambulatory Visit (HOSPITAL_COMMUNITY): Payer: Medicare PPO | Admitting: Certified Registered"

## 2020-10-24 ENCOUNTER — Encounter (HOSPITAL_COMMUNITY): Payer: Self-pay | Admitting: Gastroenterology

## 2020-10-24 ENCOUNTER — Encounter (HOSPITAL_COMMUNITY)
Admission: RE | Disposition: A | Discharge status: Nursing Home (Includes ICF) | Payer: Self-pay | Source: Home / Self Care | Attending: Gastroenterology

## 2020-10-24 ENCOUNTER — Ambulatory Visit (HOSPITAL_COMMUNITY)
Admission: RE | Admit: 2020-10-24 | Discharge: 2020-10-24 | Disposition: A | Discharge status: Other Acute Inpatient Hospital | Payer: Medicare PPO | Attending: Gastroenterology | Admitting: Gastroenterology

## 2020-10-24 DIAGNOSIS — Z880 Allergy status to penicillin: Secondary | ICD-10-CM | POA: Insufficient documentation

## 2020-10-24 DIAGNOSIS — D128 Benign neoplasm of rectum: Secondary | ICD-10-CM | POA: Diagnosis not present

## 2020-10-24 DIAGNOSIS — Z888 Allergy status to other drugs, medicaments and biological substances status: Secondary | ICD-10-CM | POA: Insufficient documentation

## 2020-10-24 DIAGNOSIS — Z881 Allergy status to other antibiotic agents status: Secondary | ICD-10-CM | POA: Diagnosis not present

## 2020-10-24 DIAGNOSIS — D122 Benign neoplasm of ascending colon: Secondary | ICD-10-CM | POA: Diagnosis not present

## 2020-10-24 DIAGNOSIS — R195 Other fecal abnormalities: Secondary | ICD-10-CM | POA: Diagnosis not present

## 2020-10-24 DIAGNOSIS — K5520 Angiodysplasia of colon without hemorrhage: Secondary | ICD-10-CM | POA: Diagnosis not present

## 2020-10-24 DIAGNOSIS — K5730 Diverticulosis of large intestine without perforation or abscess without bleeding: Secondary | ICD-10-CM | POA: Diagnosis not present

## 2020-10-24 DIAGNOSIS — Z982 Presence of cerebrospinal fluid drainage device: Secondary | ICD-10-CM | POA: Diagnosis not present

## 2020-10-24 DIAGNOSIS — D124 Benign neoplasm of descending colon: Secondary | ICD-10-CM | POA: Insufficient documentation

## 2020-10-24 DIAGNOSIS — Z9049 Acquired absence of other specified parts of digestive tract: Secondary | ICD-10-CM | POA: Insufficient documentation

## 2020-10-24 DIAGNOSIS — D123 Benign neoplasm of transverse colon: Secondary | ICD-10-CM | POA: Insufficient documentation

## 2020-10-24 DIAGNOSIS — K635 Polyp of colon: Secondary | ICD-10-CM | POA: Diagnosis not present

## 2020-10-24 DIAGNOSIS — I10 Essential (primary) hypertension: Secondary | ICD-10-CM | POA: Diagnosis not present

## 2020-10-24 DIAGNOSIS — K621 Rectal polyp: Secondary | ICD-10-CM | POA: Diagnosis not present

## 2020-10-24 HISTORY — PX: POLYPECTOMY: SHX5525

## 2020-10-24 HISTORY — PX: COLONOSCOPY WITH PROPOFOL: SHX5780

## 2020-10-24 LAB — GLUCOSE, CAPILLARY: Glucose-Capillary: 154 mg/dL — ABNORMAL HIGH (ref 70–99)

## 2020-10-24 LAB — HM COLONOSCOPY

## 2020-10-24 SURGERY — COLONOSCOPY WITH PROPOFOL
Anesthesia: Monitor Anesthesia Care

## 2020-10-24 MED ORDER — PROPOFOL 500 MG/50ML IV EMUL
INTRAVENOUS | Status: AC
  Filled 2020-10-24: qty 50

## 2020-10-24 MED ORDER — PROPOFOL 10 MG/ML IV BOLUS
INTRAVENOUS | Status: DC | PRN
  Administered 2020-10-24: 20 mg via INTRAVENOUS
  Administered 2020-10-24: 10 mg via INTRAVENOUS
  Administered 2020-10-24: 20 mg via INTRAVENOUS

## 2020-10-24 MED ORDER — PROPOFOL 500 MG/50ML IV EMUL
INTRAVENOUS | Status: DC | PRN
  Administered 2020-10-24: 125 ug/kg/min via INTRAVENOUS

## 2020-10-24 MED ORDER — SODIUM CHLORIDE 0.9 % IV SOLN: INTRAVENOUS | Status: DC

## 2020-10-24 MED ORDER — ONDANSETRON HCL 4 MG/2ML IJ SOLN
INTRAMUSCULAR | Status: DC | PRN
  Administered 2020-10-24: 4 mg via INTRAVENOUS

## 2020-10-24 MED ORDER — PROPOFOL 10 MG/ML IV BOLUS
INTRAVENOUS | Status: AC
  Filled 2020-10-24: qty 20

## 2020-10-24 MED ORDER — PROPOFOL 1000 MG/100ML IV EMUL
INTRAVENOUS | Status: AC
  Filled 2020-10-24: qty 100

## 2020-10-24 MED ORDER — LACTATED RINGERS IV SOLN: INTRAVENOUS | Status: DC

## 2020-10-24 SURGICAL SUPPLY — 22 items

## 2020-10-24 NOTE — Anesthesia Postprocedure Evaluation (Signed)
Anesthesia Post Note  Patient: [name]  Procedure(s) Performed: COLONOSCOPY WITH PROPOFOL (N/A ) POLYPECTOMY     Patient location during evaluation: Endoscopy Anesthesia Type: MAC Level of consciousness: awake and alert Pain management: pain level controlled Vital Signs Assessment: post-procedure vital signs reviewed and stable Respiratory status: spontaneous breathing, nonlabored ventilation and respiratory function stable Cardiovascular status: stable and blood pressure returned to baseline Postop Assessment: no apparent nausea or vomiting Anesthetic complications: no   No complications documented.  Last Vitals:  Vitals:   10/24/20 1318 10/24/20 1328  BP: 126/82 106/70  Pulse: 78 80  Resp: 17 19  Temp:    SpO2: 95% 97%    Last Pain:  Vitals:   10/24/20 1328  TempSrc:   PainSc: 0-No pain                 Riyah Bardon,W. EDMOND

## 2020-10-24 NOTE — H&P (Signed)
[name]   HPI: With a routine physical examination she was identified to be Cologuard positive. The patient has a history of choledocholithiasis and she is s/p multiple ERCPs with [place] in the past. She transferred her care in 2015. On 06/2014 she was evaluated in the office and an EGD for nausea was scheduled as well as a screening colonoscopy, but she did not follow through with the procedures.    Past Medical History:  Diagnosis Date  . Acid reflux disease   . Acute kidney injury ([place])   . Anxiety   . Chronic bronchitis ([place])   . Depression   . Diabetes mellitus without complication ([place])   . Dizziness   . Falls   . High cholesterol   . Hydrocephalus ([place])   . Hypertension   . Hypotension   . MVP (mitral valve prolapse)   . Sinus bradycardia   . Transaminitis     Past Surgical History:  Procedure Laterality Date  . BRAIN SURGERY    . BREAST BIOPSY Left   . CHOLECYSTECTOMY    . ERCP N/A 02/28/2014   Procedure: ENDOSCOPIC RETROGRADE CHOLANGIOPANCREATOGRAPHY (ERCP);  Surgeon: Gatha Mayer, MD;  Location: [facility] ENDOSCOPY;  Service: Endoscopy;  Laterality: N/A;  talked to tiffany from or /ebp  . ESOPHAGOGASTRODUODENOSCOPY N/A 02/28/2014   Procedure: ESOPHAGOGASTRODUODENOSCOPY (EGD);  Surgeon: Gatha Mayer, MD;  Location: [facility] ENDOSCOPY;  Service: Endoscopy;  Laterality: N/A;  . VENTRICULOPERITONEAL SHUNT     right occipital    Family History  Problem Relation Age of Onset  . Heart disease Mother   . Stroke Mother   . Hypertension Mother   . Breast cancer Mother   . Alcohol abuse Father   . Diabetes Father   . Breast cancer Maternal Aunt   . Breast cancer Maternal Grandmother   . Cancer Neg Hx     Social History:  reports that she has never smoked. She has never used smokeless tobacco. She reports that she does not drink alcohol and does not use drugs.  Allergies:  Allergies  Allergen Reactions  . Metformin And Related Diarrhea    (takes metformin  at home w/ meals)  . Ampicillin Hives    Did it involve swelling of the face/tongue/throat, SOB, or low BP? No Did it involve sudden or severe rash/hives, skin peeling, or any reaction on the inside of your mouth or nose? Yes Did you need to seek medical attention at a hospital or doctor's office? No When did it last happen?Over 10 years ago If all above answers are "NO", may proceed with cephalosporin use.  . Onion   . Ace Inhibitors Other (See Comments)    Unknown rxn per pt  . Erythromycin Hives  . Penicillins Hives    Has patient had a PCN reaction causing immediate rash, facial/tongue/throat swelling, SOB or lightheadedness with hypotension: Yes Has patient had a PCN reaction causing severe rash involving mucus membranes or skin necrosis: No Has patient had a PCN reaction that required hospitalization: No Has patient had a PCN reaction occurring within the last 10 years: No If all of the above answers are "NO", then may proceed with Cephalosporin use.   . Topamax [Topiramate] Rash    Medications:  Scheduled:  Continuous: . sodium chloride    . lactated ringers      No results found for this or any previous visit (from the past 24 hour(s)).   No results found.  ROS:  As stated above in  the HPI otherwise negative.  Height 5' 6.5" (1.689 m), weight 93.4 kg.    PE: Gen: NAD, Alert and Oriented HEENT:  [place]/AT, EOMI Neck: Supple, no LAD Lungs: CTA Bilaterally CV: RRR without M/G/R ABD: Soft, NTND, +BS Ext: No C/C/E  Assessment/Plan: 1) Positive Cologuard - colonoscopy.  Cabe Lashley D 10/24/2020, 11:47 AM

## 2020-10-24 NOTE — Op Note (Signed)
[facility] Patient Name: [name] Procedure Date: 10/24/2020 MRN: [id number] Attending MD: Carol Ada , MD Date of Birth: Nov 15, 1945 CSN: [id number] Age: 75 Admit Type: Outpatient Procedure:                Colonoscopy Indications:              Positive Cologuard test Providers:                Carol Ada, MD, Cleda Daub, RN, Laverda Sorenson,                            Technician, Virgia Land, CRNA Referring MD:              Medicines:                Propofol per Anesthesia Complications:            No immediate complications. Estimated Blood Loss:     Estimated blood loss: none. Estimated blood loss                            was minimal. Procedure:                Pre-Anesthesia Assessment:                           - Prior to the procedure, a History and Physical                            was performed, and patient medications and                            allergies were reviewed. The patient's tolerance of                            previous anesthesia was also reviewed. The risks                            and benefits of the procedure and the sedation                            options and risks were discussed with the patient.                            All questions were answered, and informed consent                            was obtained. Prior Anticoagulants: The patient has                            taken no previous anticoagulant or antiplatelet                            agents. ASA Grade Assessment: III - A patient with                            severe  systemic disease. After reviewing the risks                            and benefits, the patient was deemed in                            satisfactory condition to undergo the procedure.                           - Sedation was administered by an anesthesia                            professional. Deep sedation was attained.                           After obtaining informed consent, the  colonoscope                            was passed under direct vision. Throughout the                            procedure, the patient's blood pressure, pulse, and                            oxygen saturations were monitored continuously. The                            CF-HQ190L ([id number]) Olympus colonoscope was                            introduced through the anus and advanced to the the                            cecum, identified by appendiceal orifice and                            ileocecal valve. The colonoscopy was performed                            without difficulty. The patient tolerated the                            procedure well. The quality of the bowel                            preparation was good. The ileocecal valve,                            appendiceal orifice, and rectum were photographed. Scope In: 12:23:59 PM Scope Out: 12:51:09 PM Scope Withdrawal Time: 0 hours 20 minutes 14 seconds  Total Procedure Duration: 0 hours 27 minutes 10 seconds  Findings:      Nine sessile polyps were found in the rectum, descending colon,       transverse colon and ascending colon. The polyps were 3 to 4  mm in size.       These polyps were removed with a cold snare. Resection and retrieval       were complete.      A few medium-sized localized angiodysplastic lesions without bleeding       were found in the ascending colon.      Scattered small and large-mouthed diverticula were found in the entire       colon. Impression:               - Nine 3 to 4 mm polyps in the rectum, in the                            descending colon, in the transverse colon and in                            the ascending colon, removed with a cold snare.                            Resected and retrieved.                           - A few non-bleeding colonic angiodysplastic                            lesions.                           - Diverticulosis in the entire examined colon. Moderate  Sedation:      Not Applicable - Patient had care per Anesthesia. Recommendation:           - Patient has a contact number available for                            emergencies. The signs and symptoms of potential                            delayed complications were discussed with the                            patient. Return to normal activities tomorrow.                            Written discharge instructions were provided to the                            patient.                           - Resume previous diet.                           - Continue present medications.                           - Await pathology results.                           -  Repeat colonoscopy in 3 years for surveillance,                            if clinically appropriate. Procedure Code(s):        --- Professional ---                           [phone], Colonoscopy, flexible; with removal of                            tumor(s), polyp(s), or other lesion(s) by snare                            technique Diagnosis Code(s):        --- Professional ---                           K62.1, Rectal polyp                           K63.5, Polyp of colon                           K55.20, Angiodysplasia of colon without hemorrhage                           R19.5, Other fecal abnormalities                           K57.30, Diverticulosis of large intestine without                            perforation or abscess without bleeding CPT copyright 2019 American Medical Association. All rights reserved. The codes documented in this report are preliminary and upon coder review may  be revised to meet current compliance requirements. Carol Ada, MD Carol Ada, MD 10/24/2020 1:05:12 PM This report has been signed electronically. Number of Addenda: 0

## 2020-10-24 NOTE — Anesthesia Procedure Notes (Signed)
Procedure Name: MAC Date/Time: 10/24/2020 12:16 PM Performed by: Maxwell Caul, CRNA Pre-anesthesia Checklist: Patient identified, Emergency Drugs available, Suction available and Patient being monitored Oxygen Delivery Method: Simple face mask

## 2020-10-24 NOTE — Discharge Instructions (Signed)

## 2020-10-24 NOTE — Anesthesia Preprocedure Evaluation (Addendum)
Anesthesia Evaluation  Patient identified by MRN, date of birth, ID band Patient awake    Reviewed: Allergy & Precautions, H&P , NPO status , Patient's Chart, lab work & pertinent test results  Airway Mallampati: III  TM Distance: >3 FB Neck ROM: Full    Dental no notable dental hx. (+) Upper Dentures, Lower Dentures, Dental Advisory Given   Pulmonary neg pulmonary ROS,    Pulmonary exam normal breath sounds clear to auscultation       Cardiovascular hypertension, Pt. on medications negative cardio ROS   Rhythm:Regular Rate:Normal     Neuro/Psych  Headaches, Anxiety Depression    GI/Hepatic Neg liver ROS, GERD  Medicated,  Endo/Other  diabetes, Type 2, Oral Hypoglycemic Agents  Renal/GU negative Renal ROS  negative genitourinary   Musculoskeletal  (+) Arthritis , Osteoarthritis,    Abdominal   Peds  Hematology  (+) Blood dyscrasia, anemia ,   Anesthesia Other Findings   Reproductive/Obstetrics negative OB ROS                            Anesthesia Physical Anesthesia Plan  ASA: II  Anesthesia Plan: MAC   Post-op Pain Management:    Induction: Intravenous  PONV Risk Score and Plan: 2 and Propofol infusion and Treatment may vary due to age or medical condition  Airway Management Planned: Simple Face Mask  Additional Equipment:   Intra-op Plan:   Post-operative Plan:   Informed Consent: I have reviewed the patients History and Physical, chart, labs and discussed the procedure including the risks, benefits and alternatives for the proposed anesthesia with the patient or authorized representative who has indicated his/her understanding and acceptance.     Dental advisory given  Plan Discussed with: CRNA  Anesthesia Plan Comments:         Anesthesia Quick Evaluation

## 2020-10-24 NOTE — Transfer of Care (Signed)
Immediate Anesthesia Transfer of Care Note  Patient: [name]  Procedure(s) Performed: COLONOSCOPY WITH PROPOFOL (N/A ) POLYPECTOMY  Patient Location: PACU and Endoscopy Unit  Anesthesia Type:MAC  Level of Consciousness: awake, alert  and oriented  Airway & Oxygen Therapy: Patient Spontanous Breathing and Patient connected to face mask oxygen  Post-op Assessment: Report given to RN and Post -op Vital signs reviewed and stable  Post vital signs: Reviewed and stable  Last Vitals:  Vitals Value Taken Time  BP    Temp    Pulse    Resp    SpO2      Last Pain:  Vitals:   10/24/20 1152  TempSrc: Oral  PainSc: 0-No pain         Complications: No complications documented.

## 2020-10-27 ENCOUNTER — Encounter (HOSPITAL_COMMUNITY): Payer: Self-pay | Admitting: Gastroenterology

## 2020-10-27 ENCOUNTER — Encounter: Payer: Self-pay | Admitting: Internal Medicine

## 2020-10-27 ENCOUNTER — Other Ambulatory Visit: Payer: Self-pay | Admitting: Internal Medicine

## 2020-10-27 DIAGNOSIS — J301 Allergic rhinitis due to pollen: Secondary | ICD-10-CM

## 2020-10-27 LAB — SURGICAL PATHOLOGY

## 2020-10-27 MED ORDER — LEVOCETIRIZINE DIHYDROCHLORIDE 5 MG PO TABS: 10.0000 mg | ORAL_TABLET | Freq: Every evening | ORAL | 1 refills | Status: DC

## 2020-10-27 NOTE — Telephone Encounter (Signed)
Order faxed to [facility].

## 2020-11-02 ENCOUNTER — Encounter: Payer: Self-pay | Admitting: Internal Medicine

## 2020-11-03 ENCOUNTER — Other Ambulatory Visit: Payer: Self-pay | Admitting: Internal Medicine

## 2020-11-03 DIAGNOSIS — K5904 Chronic idiopathic constipation: Secondary | ICD-10-CM

## 2020-11-03 DIAGNOSIS — K7581 Nonalcoholic steatohepatitis (NASH): Secondary | ICD-10-CM

## 2020-11-03 MED ORDER — LINACLOTIDE 145 MCG PO CAPS: 145.0000 ug | ORAL_CAPSULE | Freq: Every day | ORAL | 1 refills | Status: AC

## 2020-11-04 ENCOUNTER — Encounter: Payer: Self-pay | Admitting: Internal Medicine

## 2020-11-05 ENCOUNTER — Other Ambulatory Visit: Payer: Self-pay | Admitting: Internal Medicine

## 2020-11-05 ENCOUNTER — Telehealth: Payer: Self-pay | Admitting: Internal Medicine

## 2020-11-05 NOTE — Telephone Encounter (Signed)
Order has been faxed

## 2020-11-05 NOTE — Telephone Encounter (Signed)
Kirke Shaggy w/ [facility] called and is requesting an order to d/c nasal spray.Please advise   Phone: [phone] Fax: [phone]

## 2020-11-05 NOTE — Telephone Encounter (Signed)
yes

## 2020-11-09 ENCOUNTER — Encounter: Payer: Self-pay | Admitting: Internal Medicine

## 2020-11-21 ENCOUNTER — Encounter: Payer: Self-pay | Admitting: Internal Medicine

## 2020-11-24 ENCOUNTER — Encounter: Payer: Self-pay | Admitting: Internal Medicine

## 2020-11-28 ENCOUNTER — Encounter: Payer: Self-pay | Admitting: Internal Medicine

## 2020-11-28 ENCOUNTER — Other Ambulatory Visit: Payer: Self-pay | Admitting: Internal Medicine

## 2020-11-28 NOTE — Telephone Encounter (Signed)
I have responded to previous [employer] messages in regard. Please view the message encounters from 11/02/20 and 11/21/20 in regard.

## 2020-12-01 ENCOUNTER — Telehealth: Payer: Self-pay | Admitting: Internal Medicine

## 2020-12-01 NOTE — Telephone Encounter (Signed)
[facility] Latino calling, requesting to speak to someone about coordinating the patients medication

## 2020-12-03 ENCOUNTER — Encounter: Payer: Self-pay | Admitting: Internal Medicine

## 2020-12-09 ENCOUNTER — Encounter: Payer: Self-pay | Admitting: Internal Medicine

## 2021-01-12 ENCOUNTER — Ambulatory Visit: Payer: Medicare PPO | Admitting: Internal Medicine

## 2021-01-12 ENCOUNTER — Telehealth: Payer: Self-pay | Admitting: Internal Medicine

## 2021-01-12 NOTE — Telephone Encounter (Signed)
Kirke Shaggy w/ [facility] called and said that the patient found a pill on the floor and took it and thought it was hers. Kirke Shaggy said that she does not know whose it was or what kind of pill. She said that they have been monitoring the patients vitals and they are stable. She said that there has been no reaction. She was wondering if Dr. Ronnald Ramp wanted them to monitor anything else and let him know. Please advise   Phone: [phone]

## 2021-01-26 ENCOUNTER — Encounter: Payer: Self-pay | Admitting: Internal Medicine

## 2021-01-27 ENCOUNTER — Encounter: Payer: Self-pay | Admitting: Internal Medicine

## 2021-02-17 ENCOUNTER — Encounter: Payer: Self-pay | Admitting: Internal Medicine

## 2021-02-17 DIAGNOSIS — G910 Communicating hydrocephalus: Secondary | ICD-10-CM

## 2021-02-17 DIAGNOSIS — N3281 Overactive bladder: Secondary | ICD-10-CM

## 2021-02-18 NOTE — Telephone Encounter (Signed)
Human calling to request authorization be completed using [email or web address] or by calling [phone] Fax [phone]

## 2021-03-02 ENCOUNTER — Telehealth: Payer: Self-pay | Admitting: Diagnostic Neuroimaging

## 2021-03-02 NOTE — Telephone Encounter (Signed)
[name] is asking for a call to discuss pt rocking back and forth uncontrollably.  Pt has also had 2 falls within the last 5 days, please call to discuss.

## 2021-03-02 NOTE — Telephone Encounter (Signed)
Called Romeo , [facility] POA and advised patient be seen by PCP to evaluate for possible infection, dehydration, hypotension or other causes of her recent symptoms. Horris Latino stated she will call today, verbalized understanding, appreciation.

## 2021-03-02 NOTE — Progress Notes (Signed)
Subjective:    Patient ID: [name], female    DOB: January 29, 1946, 75 y.o.   MRN: [id number]  HPI The patient is here for an acute visit.  She is here with her guardian who provides some of the history.      ? UTI:  she has chronic UTI with chronic incontinence.  Her urine is dark and cloudy.  She has no other symptoms and usually does not have symptoms. There is some confusion.    She can not stop rocking back and forth, which started 2 months ago.  Her psychiatrist stopped her Abilify about 1 month ago and there was no improvement.  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 Trenton Gammon wanted to make sure there is no other medical causes before they evaluated the shunt, which is why she is here today.   She has had 3 falls in the past month - she just fell backwards.  These were unusual falls-not typical of tripping.  When asked the patient does state some increased anxiety regarding possibly getting a roommate in her room.  She also is not very happy where she is living right now.  She has a friend and they had an argument and they are no longer friends.   Medications and allergies reviewed with patient and updated if appropriate.  Patient Active Problem List   Diagnosis Date Noted   Positive colorectal cancer screening using Cologuard test 10/03/2020   Chronic idiopathic constipation 09/24/2020   Chronic left shoulder pain 07/28/2020   Community acquired pneumonia of right lower lobe of lung 07/28/2020   Cervical radiculopathy 01/10/2020   Elevated LFTs 01/10/2020   NASH (nonalcoholic steatohepatitis) 01/10/2020   Seasonal allergic rhinitis due to pollen 10/24/2018   Carpal tunnel syndrome of left wrist 03/07/2018   Current severe episode of major depressive disorder with psychotic features ([place]) 08/15/2017   Simple chronic bronchitis ([place]) 06/28/2017   Excessive daytime sleepiness 12/21/2016   Migraine without aura and with status migrainosus, not intractable 03/04/2016   Left  ventricular diastolic dysfunction, NYHA class 2 01/21/2016   Morbid obesity ([place]) 01/21/2016   Psychogenic vomiting with nausea 11/02/2015   Cervical radiculitis 08/07/2015   GAD (generalized anxiety disorder) 07/18/2014   Primary osteoarthritis of both knees 05/23/2014   Visit for screening mammogram 05/22/2014   Hyperlipidemia with target LDL less than 100 05/22/2014   Vitamin D deficiency 12/28/2013   Estrogen deficiency 12/27/2013   Routine general medical examination at a health care facility 12/27/2013   Type II diabetes mellitus with manifestations ([place]) 08/15/2013   OAB (overactive bladder) 08/15/2013   Vitamin B12 deficiency anemia 06/05/2010   HYDROCEPHALUS, NORMAL PRESSURE 06/03/2010   Anxiety 05/15/2008   Essential hypertension 05/15/2008    Current Outpatient Medications on File Prior to Visit  Medication Sig Dispense Refill   acetaminophen (TYLENOL) 325 MG tablet Take 650 mg by mouth 2 (two) times daily as needed (pain).     amLODipine (NORVASC) 2.5 MG tablet Take 2.5 mg by mouth in the morning. (0900)     atorvastatin (LIPITOR) 40 MG tablet Take 1 tablet (40 mg total) by mouth daily. (Patient taking differently: Take 40 mg by mouth every evening. (2100)) 90 tablet 1   bisacodyl (DULCOLAX) 10 MG suppository Place 10 mg rectally daily as needed for moderate constipation.     Cholecalciferol 125 MCG (5000 UT) capsule Take 1 capsule (5,000 Units total) by mouth daily. (Patient taking differently: Take 5,000 Units by mouth in  the morning. (0800)) 90 capsule 1   desvenlafaxine (PRISTIQ) 100 MG 24 hr tablet Take 100 mg by mouth every evening. (2100)     levocetirizine (XYZAL) 5 MG tablet Take 2 tablets (10 mg total) by mouth every evening. 180 tablet 1   lidocaine (LIDODERM) 5 % Place 1 patch onto the skin daily. Remove & Discard patch within 12 hours or as directed by MD (Patient taking differently: Place 1 patch onto the skin every 12 (twelve) hours as needed (pain). Remove &  Discard patch within 12 hours or as directed by MD) 30 patch 5   linaclotide (LINZESS) 145 MCG CAPS capsule Take 1 capsule (145 mcg total) by mouth daily before breakfast. 90 capsule 1   LORazepam (ATIVAN) 1 MG tablet Take 1 tablet (1 mg total) by mouth every 8 (eight) hours as needed for anxiety. 90 tablet 3   mirabegron ER (MYRBETRIQ) 25 MG TB24 tablet Take 1 tablet (25 mg total) by mouth daily. (Patient taking differently: Take 25 mg by mouth daily. (0900)) 90 tablet 1   pantoprazole (PROTONIX) 40 MG tablet Take 1 tablet (40 mg total) by mouth daily. (Patient taking differently: Take 40 mg by mouth in the morning. (0800)) 90 tablet 3   pioglitazone (ACTOS) 15 MG tablet Take 1 tablet (15 mg total) by mouth daily. (Patient taking differently: Take 15 mg by mouth daily. (0800)) 90 tablet 1   prazosin (MINIPRESS) 2 MG capsule Take 2 mg by mouth at bedtime. (2100)     solifenacin (VESICARE) 5 MG tablet Take 1 tablet (5 mg total) by mouth daily. (Patient taking differently: Take 5 mg by mouth in the morning. (0800)) 90 tablet 1   SUMAtriptan (IMITREX) 50 MG tablet Take 1 tablet (50 mg total) by mouth every 2 (two) hours as needed for migraine. May repeat in 2 hours if headache persists or recurs. 10 tablet 2   Tiotropium Bromide Monohydrate (SPIRIVA RESPIMAT) 2.5 MCG/ACT AERS Inhale 2 puffs into the lungs daily. (Patient taking differently: Inhale 2 puffs into the lungs daily. (0900)) 12 g 1   zolpidem (AMBIEN) 5 MG tablet Take 1 tablet (5 mg total) by mouth at bedtime as needed for sleep. (Patient taking differently: Take 5 mg by mouth at bedtime as needed (insomnia).) 90 tablet 0   No current facility-administered medications on file prior to visit.    Past Medical History:  Diagnosis Date   Acid reflux disease    Acute kidney injury ([place])    Anxiety    Chronic bronchitis ([place])    Depression    Diabetes mellitus without complication ([place])    Dizziness    Falls    High cholesterol     Hydrocephalus (H[facility])    Hypertension    Hypotension    MVP (mitral valve prolapse)    Sinus bradycardia    Transaminitis     Past Surgical History:  Procedure Laterality Date   BRAIN SURGERY     BREAST BIOPSY Left    CHOLECYSTECTOMY     COLONOSCOPY WITH PROPOFOL N/A 10/24/2020   Procedure: COLONOSCOPY WITH PROPOFOL;  Surgeon: Carol Ada, MD;  Location: [facility] ENDOSCOPY;  Service: Endoscopy;  Laterality: N/A;   ERCP N/A 02/28/2014   Procedure: ENDOSCOPIC RETROGRADE CHOLANGIOPANCREATOGRAPHY (ERCP);  Surgeon: Gatha Mayer, MD;  Location: [facility] ENDOSCOPY;  Service: Endoscopy;  Laterality: N/A;  talked to tiffany from or /ebp   ESOPHAGOGASTRODUODENOSCOPY N/A 02/28/2014   Procedure: ESOPHAGOGASTRODUODENOSCOPY (EGD);  Surgeon: Gatha Mayer, MD;  Location[facility]  ENDOSCOPY;  Service: Endoscopy;  Laterality: N/A;   POLYPECTOMY  10/24/2020   Procedure: POLYPECTOMY;  Surgeon: Carol Ada, MD;  Location: [facility] ENDOSCOPY;  Service: Endoscopy;;   VENTRICULOPERITONEAL SHUNT     right occipital    Social History   Socioeconomic History   Marital status: Divorced    Spouse name: Not on file   Number of children: Not on file   Years of education: Not on file   Highest education level: Not on file  Occupational History   Not on file  Tobacco Use   Smoking status: Never   Smokeless tobacco: Never  Vaping Use   Vaping Use: Never used  Substance and Sexual Activity   Alcohol use: No   Drug use: No   Sexual activity: Not Currently  Other Topics Concern   Not on file  Social History Narrative   Lives at [facility].  Lives with cat/ Maggie.  Education HS.  Children none.  Divorced.  Retired.   Social Determinants of Health   Financial Resource Strain: Not on file  Food Insecurity: Not on file  Transportation Needs: Not on file  Physical Activity: Not on file  Stress: Not on file  Social Connections: Not on file    Family History  Problem Relation Age of Onset   Heart disease Mother     Stroke Mother    Hypertension Mother    Breast cancer Mother    Alcohol abuse Father    Diabetes Father    Breast cancer Maternal Aunt    Breast cancer Maternal Grandmother    Cancer Neg Hx     Review of Systems  Constitutional:  Negative for appetite change, chills and fever.  Eyes:  Negative for visual disturbance.  Respiratory:  Positive for cough (mild, chronic cough). Negative for shortness of breath and wheezing.   Cardiovascular:  Negative for chest pain, palpitations and leg swelling.  Gastrointestinal:  Positive for diarrhea (x 2 weeks - at least once a day), nausea and vomiting (once 4 days ago). Negative for abdominal pain, blood in stool and constipation.  Genitourinary:  Negative for dysuria and hematuria.       Ark and cloudy urine  Musculoskeletal:  Negative for arthralgias and back pain.  Skin:  Positive for rash (Under breasts).  Neurological:  Positive for headaches (occ). Negative for dizziness and light-headedness.  Psychiatric/Behavioral:  Positive for confusion and decreased concentration.       Objective:   Vitals:   03/03/21 1329  BP: 132/70  Pulse: 91  Temp: 98.6 F (37 C)  SpO2: 96%   BP Readings from Last 3 Encounters:  03/03/21 132/70  10/24/20 106/70  10/08/20 124/78   Wt Readings from Last 3 Encounters:  03/03/21 200 lb 6.4 oz (90.9 kg)  10/24/20 205 lb 14.6 oz (93.4 kg)  10/08/20 199 lb (90.3 kg)   Body mass index is 31.86 kg/m.   Physical Exam Constitutional:      General: She is not in acute distress.    Appearance: Normal appearance. She is not ill-appearing, toxic-appearing or diaphoretic.  HENT:     Head: Normocephalic and atraumatic.  Eyes:     Conjunctiva/sclera: Conjunctivae normal.  Cardiovascular:     Rate and Rhythm: Normal rate and regular rhythm.     Heart sounds: No murmur heard. Pulmonary:     Effort: Pulmonary effort is normal. No respiratory distress.     Breath sounds: No wheezing or rales.  Abdominal:  General: There is no distension.     Palpations: Abdomen is soft.     Tenderness: There is no abdominal tenderness. There is no guarding or rebound.  Musculoskeletal:     Cervical back: Neck supple. No tenderness.     Right lower leg: No edema.     Left lower leg: No edema.  Lymphadenopathy:     Cervical: No cervical adenopathy.  Skin:    General: Skin is warm and dry.     Findings: Rash (Erythematous rash under bilateral breasts and upper abdomen) present.  Neurological:     General: No focal deficit present.     Mental Status: She is alert.     Sensory: No sensory deficit.     Motor: No weakness.     Gait: Gait abnormal (Chronic).  Psychiatric:     Comments: Rocking back and forth while sitting.  Mood is slightly anxious           Assessment & Plan:    Dysuria, UTI: She is frequent urinary tract infections Urine dip here suggestive of possible UTI so we will go ahead and start Keflex 500 mg twice daily x1 week, which she has done well with in the past Send urine for culture Increase fluids, especially water Discussed that I do not think this is the cause of her rocking back and forth  Atypical falls: ?  Could possibly be related to nonfunctioning shunt in addition to her confusion She is chronic incontinence so it is hard to say if that that is worse She will follow-up with neurosurgery for further evaluation of this  Rocking back-and-forth: Following with psychiatry Will see neurology Will rule out other possible causes with blood work CBC, TSH, CMP There is some increased anxiety and that could ultimately be the cause I do not think this is related to her UTI or anything else medical from our standpoint  Candidiasis, skin: Under breasts Deferred any prescription powder or cream Will try tea tree oil, which is within the past   This visit occurred during the SARS-CoV-2 public health emergency.  Safety protocols were in place, including screening questions  prior to the visit, additional usage of staff PPE, and extensive cleaning of exam room while observing appropriate contact time as indicated for disinfecting solutions.

## 2021-03-03 ENCOUNTER — Other Ambulatory Visit: Payer: Self-pay

## 2021-03-03 ENCOUNTER — Encounter: Payer: Self-pay | Admitting: Internal Medicine

## 2021-03-03 ENCOUNTER — Ambulatory Visit: Payer: Medicare PPO | Admitting: Internal Medicine

## 2021-03-03 VITALS — BP 132/70 | HR 91 | Temp 98.6°F | Wt 200.4 lb

## 2021-03-03 DIAGNOSIS — Z9181 History of falling: Secondary | ICD-10-CM

## 2021-03-03 DIAGNOSIS — R410 Disorientation, unspecified: Secondary | ICD-10-CM | POA: Diagnosis not present

## 2021-03-03 DIAGNOSIS — N3001 Acute cystitis with hematuria: Secondary | ICD-10-CM

## 2021-03-03 DIAGNOSIS — B372 Candidiasis of skin and nail: Secondary | ICD-10-CM

## 2021-03-03 DIAGNOSIS — W19XXXA Unspecified fall, initial encounter: Secondary | ICD-10-CM

## 2021-03-03 LAB — POCT URINALYSIS DIPSTICK
Bilirubin, UA: NEGATIVE
Glucose, UA: NEGATIVE
Ketones, UA: NEGATIVE
Nitrite, UA: POSITIVE
Protein, UA: POSITIVE — AB
Spec Grav, UA: >=1.03 — AB (ref 1.010–1.025)
Urobilinogen, UA: 1 E.U./dL
pH, UA: 6 (ref 5.0–8.0)

## 2021-03-03 LAB — COMPREHENSIVE METABOLIC PANEL
ALT: 48 U/L — ABNORMAL HIGH (ref 0–35)
AST: 47 U/L — ABNORMAL HIGH (ref 0–37)
Albumin: 3.3 g/dL — ABNORMAL LOW (ref 3.5–5.2)
Alkaline Phosphatase: 248 U/L — ABNORMAL HIGH (ref 39–117)
BUN: 20 mg/dL (ref 6–23)
CO2: 28 mEq/L (ref 19–32)
Calcium: 8.8 mg/dL (ref 8.4–10.5)
Chloride: 104 mEq/L (ref 96–112)
Creatinine, Ser: 0.78 mg/dL (ref 0.40–1.20)
GFR: 75.01 mL/min (ref >60.00)
Glucose, Bld: 150 mg/dL — ABNORMAL HIGH (ref 70–99)
Potassium: 3.6 mEq/L (ref 3.5–5.1)
Sodium: 141 mEq/L (ref 135–145)
Total Bilirubin: 0.8 mg/dL (ref 0.2–1.2)
Total Protein: 6.9 g/dL (ref 6.0–8.3)

## 2021-03-03 LAB — CBC WITH DIFFERENTIAL/PLATELET
Basophils Absolute: 0 10*3/uL (ref 0.0–0.1)
Basophils Relative: 1.1 % (ref 0.0–3.0)
Eosinophils Absolute: 0.1 10*3/uL (ref 0.0–0.7)
Eosinophils Relative: 2.9 % (ref 0.0–5.0)
HCT: 38.3 % (ref 36.0–46.0)
Hemoglobin: 12.9 g/dL (ref 12.0–15.0)
Lymphocytes Relative: 21.9 % (ref 12.0–46.0)
Lymphs Abs: 0.9 10*3/uL (ref 0.7–4.0)
MCHC: 33.8 g/dL (ref 30.0–36.0)
MCV: 94 fl (ref 78.0–100.0)
Monocytes Absolute: 0.3 10*3/uL (ref 0.1–1.0)
Monocytes Relative: 8 % (ref 3.0–12.0)
Neutro Abs: 2.7 10*3/uL (ref 1.4–7.7)
Neutrophils Relative %: 66.1 % (ref 43.0–77.0)
Platelets: 158 10*3/uL (ref 150.0–400.0)
RBC: 4.07 Mil/uL (ref 3.87–5.11)
RDW: 13 % (ref 11.5–15.5)
WBC: 4.1 10*3/uL (ref 4.0–10.5)

## 2021-03-03 LAB — TSH: TSH: 1.25 u[IU]/mL (ref 0.35–5.50)

## 2021-03-03 MED ORDER — TEA TREE OIL OIL: TOPICAL_OIL | 0 refills | Status: DC

## 2021-03-03 MED ORDER — CEPHALEXIN 500 MG PO CAPS: 500.0000 mg | ORAL_CAPSULE | Freq: Two times a day (BID) | ORAL | 0 refills | Status: DC

## 2021-03-03 NOTE — Patient Instructions (Addendum)
  Have blood work done today.   It looks like you have a urine infection.  We will send your urine for a culture  Medications changes include :   start keflex 500 mg twice daily x 1 week.  Start applying tea tree oil under the breasts / chest a 2 times a day for your rash.  Please monitor the rash and call if it is not improving.  Increase your water intake.     Your prescription(s) have been submitted to your pharmacy. Please take as directed and contact our office if you believe you are having problem(s) with the medication(s).

## 2021-03-05 ENCOUNTER — Encounter: Payer: Self-pay | Admitting: Internal Medicine

## 2021-03-05 DIAGNOSIS — R030 Elevated blood-pressure reading, without diagnosis of hypertension: Secondary | ICD-10-CM | POA: Diagnosis not present

## 2021-03-05 DIAGNOSIS — Z6832 Body mass index (BMI) 32.0-32.9, adult: Secondary | ICD-10-CM | POA: Diagnosis not present

## 2021-03-05 DIAGNOSIS — G919 Hydrocephalus, unspecified: Secondary | ICD-10-CM | POA: Diagnosis not present

## 2021-03-05 LAB — URINE CULTURE

## 2021-03-05 MED ORDER — NITROFURANTOIN MONOHYD MACRO 100 MG PO CAPS: 100.0000 mg | ORAL_CAPSULE | Freq: Two times a day (BID) | ORAL | 0 refills | Status: DC

## 2021-03-05 NOTE — Addendum Note (Signed)
Addended by: Binnie Rail on: 03/05/2021 02:44 PM   Modules accepted: Orders

## 2021-03-06 ENCOUNTER — Telehealth: Payer: Self-pay

## 2021-03-06 ENCOUNTER — Telehealth: Payer: Self-pay | Admitting: Internal Medicine

## 2021-03-06 NOTE — Telephone Encounter (Signed)
Order has been placed on PCPs desk for review and signature.

## 2021-03-06 NOTE — Telephone Encounter (Signed)
I have spoken with pts health care POA Horris Latino and was able to inform her of Dr. Quay Burow Instructions. She has stated an order needs to be sent to pts facility [facility] with d/c of old ABX and start new ABX.  FaxKO:[phone].

## 2021-03-06 NOTE — Telephone Encounter (Signed)
[facility] called  They received a request for verbals from Dr. Chucky May ([place]). Requesting a written authorization giving permission to receive orders from Dr. Toy Care.   Please advise.   Fax #:  [phone]

## 2021-03-09 ENCOUNTER — Other Ambulatory Visit: Payer: Self-pay | Admitting: Neurosurgery

## 2021-03-09 DIAGNOSIS — G919 Hydrocephalus, unspecified: Secondary | ICD-10-CM

## 2021-03-09 NOTE — Telephone Encounter (Signed)
Order has been faxed

## 2021-03-10 ENCOUNTER — Encounter: Payer: Self-pay | Admitting: Internal Medicine

## 2021-03-10 ENCOUNTER — Telehealth: Payer: Self-pay | Admitting: Internal Medicine

## 2021-03-10 NOTE — Telephone Encounter (Signed)
Horris Latino called in & is requesting all orders from previous appt Monday (08.01.22) be faxed over to [facility]  Would also like provider to add note that patient is suppose to have pitcher of water w/ glass every morning & also notate for nurses to shake tea tree oil before applying to patient twice daily   Please fax to [phone]

## 2021-03-10 NOTE — Telephone Encounter (Signed)
Orders have been done and placed on PCPs desk for review and signature.

## 2021-03-11 NOTE — Telephone Encounter (Signed)
Orders have been faxed

## 2021-03-13 ENCOUNTER — Telehealth: Payer: Self-pay

## 2021-03-13 ENCOUNTER — Telehealth: Payer: Self-pay | Admitting: *Deleted

## 2021-03-13 NOTE — Telephone Encounter (Signed)
Order placed on PCPs desk

## 2021-03-13 NOTE — Addendum Note (Signed)
Addended by: Hinda Kehr on: 03/13/2021 03:52 PM   Modules accepted: Orders

## 2021-03-13 NOTE — Chronic Care Management (AMB) (Signed)
  Chronic Care Management   Note  03/13/2021 Name: [name] MRN: [id number] DOB: 24-Mar-1946  [name] is a 75 y.o. year old female who is a primary care patient of Janith Lima, MD. I reached out to [name] by phone today in response to a referral sent by [name]'s PCP Janith Lima, MD     [name] was given information about Chronic Care Management services today including:  CCM service includes personalized support from designated clinical staff supervised by her physician, including individualized plan of care and coordination with other care providers 24/7 contact phone numbers for assistance for urgent and routine care needs. Service will only be billed when office clinical staff spend 20 minutes or more in a month to coordinate care. Only one practitioner may furnish and bill the service in a calendar month. The patient may stop CCM services at any time (effective at the end of the month) by phone call to the office staff. The patient will be responsible for cost sharing (co-pay) of up to 20% of the service fee (after annual deductible is met).  Patient did not agree to enrollment in care management services and does not wish to consider at this time. [name] says she doesn't need appt with anyone just for provider to call insurance about medical supplies so medicare will cover cost, says this was listed in pt chart and she has already spoken with someone regarding this who will take care of her request   Follow up plan: Patient declines further follow up and engagement by the care management team. Appropriate care team members and provider have been notified via electronic communication.    Julian Hy, [facility]  Direct Dial: [phone]

## 2021-03-13 NOTE — Telephone Encounter (Signed)
I have spoke to a representative at [facility] (ref # [id number]) she stated that an "authorization" is not needed. She stated an order would need to be faxed to the company providing the service. I have an order completed and placed on PCPs desk for signature. I have also reached out to [employer] for fax number but spoke to a representative names Manuela Schwartz who stated they do not run insurance there and all customers pay out of pocket for supplies. She recommended that the pt and/or caregiver use a company that runs insurance if they would like to have the undergarments covered through Medicare. I have reached back out to [place] to inform her of the above conversations. She stated that she would talk to Collie Siad (patients friend) to come pick up order once it is ready.

## 2021-03-14 ENCOUNTER — Ambulatory Visit
Admission: RE | Admit: 2021-03-14 | Discharge: 2021-03-14 | Disposition: A | Discharge status: Home / Self Care | Payer: Medicare PPO | Source: Ambulatory Visit | Attending: Neurosurgery | Admitting: Neurosurgery

## 2021-03-14 ENCOUNTER — Other Ambulatory Visit: Payer: Self-pay

## 2021-03-14 DIAGNOSIS — I639 Cerebral infarction, unspecified: Secondary | ICD-10-CM | POA: Diagnosis not present

## 2021-03-14 DIAGNOSIS — G919 Hydrocephalus, unspecified: Secondary | ICD-10-CM

## 2021-04-01 ENCOUNTER — Telehealth: Payer: Self-pay | Admitting: Internal Medicine

## 2021-04-01 DIAGNOSIS — G919 Hydrocephalus, unspecified: Secondary | ICD-10-CM | POA: Diagnosis not present

## 2021-04-01 NOTE — Telephone Encounter (Signed)
Patients healthcare POA is requesting a hospice evaluation  [name] requesting verbal order to assess patient  Please follow up [phone]

## 2021-04-02 NOTE — Telephone Encounter (Signed)
Verbal order given to [facility] to conduct hospice eval on VM

## 2021-04-09 ENCOUNTER — Encounter: Payer: Self-pay | Admitting: Internal Medicine

## 2021-04-13 ENCOUNTER — Encounter: Payer: Self-pay | Admitting: Internal Medicine

## 2021-04-21 ENCOUNTER — Ambulatory Visit (INDEPENDENT_AMBULATORY_CARE_PROVIDER_SITE_OTHER): Payer: Medicare PPO | Admitting: Internal Medicine

## 2021-04-21 ENCOUNTER — Other Ambulatory Visit: Payer: Self-pay

## 2021-04-21 ENCOUNTER — Encounter: Payer: Self-pay | Admitting: Internal Medicine

## 2021-04-21 VITALS — BP 126/78 | HR 100 | Temp 98.0°F | Ht 66.5 in | Wt 194.0 lb

## 2021-04-21 DIAGNOSIS — D510 Vitamin B12 deficiency anemia due to intrinsic factor deficiency: Secondary | ICD-10-CM

## 2021-04-21 DIAGNOSIS — K7581 Nonalcoholic steatohepatitis (NASH): Secondary | ICD-10-CM | POA: Diagnosis not present

## 2021-04-21 DIAGNOSIS — F411 Generalized anxiety disorder: Secondary | ICD-10-CM

## 2021-04-21 DIAGNOSIS — E785 Hyperlipidemia, unspecified: Secondary | ICD-10-CM

## 2021-04-21 DIAGNOSIS — I10 Essential (primary) hypertension: Secondary | ICD-10-CM | POA: Diagnosis not present

## 2021-04-21 DIAGNOSIS — B354 Tinea corporis: Secondary | ICD-10-CM

## 2021-04-21 DIAGNOSIS — E118 Type 2 diabetes mellitus with unspecified complications: Secondary | ICD-10-CM

## 2021-04-21 DIAGNOSIS — Z0000 Encounter for general adult medical examination without abnormal findings: Secondary | ICD-10-CM | POA: Diagnosis not present

## 2021-04-21 DIAGNOSIS — E559 Vitamin D deficiency, unspecified: Secondary | ICD-10-CM

## 2021-04-21 DIAGNOSIS — Z23 Encounter for immunization: Secondary | ICD-10-CM | POA: Diagnosis not present

## 2021-04-21 DIAGNOSIS — M170 Bilateral primary osteoarthritis of knee: Secondary | ICD-10-CM

## 2021-04-21 LAB — FOLATE: Folate: 6.6 ng/mL (ref >5.9)

## 2021-04-21 LAB — LIPID PANEL
Cholesterol: 127 mg/dL (ref 0–200)
HDL: 47.4 mg/dL (ref >39.00)
LDL Cholesterol: 56 mg/dL (ref 0–99)
NonHDL: 79.53
Total CHOL/HDL Ratio: 3
Triglycerides: 118 mg/dL (ref 0.0–149.0)
VLDL: 23.6 mg/dL (ref 0.0–40.0)

## 2021-04-21 LAB — VITAMIN D 25 HYDROXY (VIT D DEFICIENCY, FRACTURES): VITD: 78.88 ng/mL (ref 30.00–100.00)

## 2021-04-21 LAB — HEMOGLOBIN A1C: Hgb A1c MFr Bld: 6.6 % — ABNORMAL HIGH (ref 4.6–6.5)

## 2021-04-21 LAB — TSH: TSH: 2.39 u[IU]/mL (ref 0.35–5.50)

## 2021-04-21 LAB — VITAMIN B12: Vitamin B-12: 227 pg/mL (ref 211–911)

## 2021-04-21 MED ORDER — LORAZEPAM 1 MG PO TABS: 1.0000 mg | ORAL_TABLET | Freq: Three times a day (TID) | ORAL | 3 refills | Status: AC | PRN

## 2021-04-21 MED ORDER — FLUCONAZOLE 100 MG PO TABS: 100.0000 mg | ORAL_TABLET | Freq: Every day | ORAL | 0 refills | Status: AC

## 2021-04-21 NOTE — Progress Notes (Signed)
Subjective:  Patient ID: [name], female    DOB: 1945-11-06  Age: 75 y.o. MRN: [id number]  CC: Annual Exam, Hypertension, Hyperlipidemia, and Diabetes  This visit occurred during the SARS-CoV-2 public health emergency.  Safety protocols were in place, including screening questions prior to the visit, additional usage of staff PPE, and extensive cleaning of exam room while observing appropriate contact time as indicated for disinfecting solutions.    HPI [name] presents for a CPX and f/up -   She complains of a 3 week history of rash under both breasts. It has not improved with tea tree oil.  Outpatient Medications Prior to Visit  Medication Sig Dispense Refill   acetaminophen (TYLENOL) 325 MG tablet Take 650 mg by mouth 2 (two) times daily as needed (pain).     amLODipine (NORVASC) 2.5 MG tablet Take 2.5 mg by mouth in the morning. (0900)     atorvastatin (LIPITOR) 40 MG tablet Take 1 tablet (40 mg total) by mouth daily. (Patient taking differently: Take 40 mg by mouth every evening. (2100)) 90 tablet 1   bisacodyl (DULCOLAX) 10 MG suppository Place 10 mg rectally daily as needed for moderate constipation.     desvenlafaxine (PRISTIQ) 100 MG 24 hr tablet Take 100 mg by mouth every evening. (2100)     levocetirizine (XYZAL) 5 MG tablet Take 2 tablets (10 mg total) by mouth every evening. 180 tablet 1   lidocaine (LIDODERM) 5 % Place 1 patch onto the skin daily. Remove & Discard patch within 12 hours or as directed by MD (Patient taking differently: Place 1 patch onto the skin every 12 (twelve) hours as needed (pain). Remove & Discard patch within 12 hours or as directed by MD) 30 patch 5   linaclotide (LINZESS) 145 MCG CAPS capsule Take 1 capsule (145 mcg total) by mouth daily before breakfast. 90 capsule 1   mirabegron ER (MYRBETRIQ) 25 MG TB24 tablet Take 1 tablet (25 mg total) by mouth daily. (Patient taking differently: Take 25 mg by mouth daily. (0900)) 90 tablet 1    pantoprazole (PROTONIX) 40 MG tablet Take 1 tablet (40 mg total) by mouth daily. (Patient taking differently: Take 40 mg by mouth in the morning. (0800)) 90 tablet 3   prazosin (MINIPRESS) 2 MG capsule Take 2 mg by mouth at bedtime. (2100)     solifenacin (VESICARE) 5 MG tablet Take 1 tablet (5 mg total) by mouth daily. (Patient taking differently: Take 5 mg by mouth in the morning. (0800)) 90 tablet 1   SUMAtriptan (IMITREX) 50 MG tablet Take 1 tablet (50 mg total) by mouth every 2 (two) hours as needed for migraine. May repeat in 2 hours if headache persists or recurs. 10 tablet 2   Tea Tree Oil OIL Apply under breasts a 2  times a day for rash  0   Tiotropium Bromide Monohydrate (SPIRIVA RESPIMAT) 2.5 MCG/ACT AERS Inhale 2 puffs into the lungs daily. (Patient taking differently: Inhale 2 puffs into the lungs daily. (0900)) 12 g 1   zolpidem (AMBIEN) 5 MG tablet Take 1 tablet (5 mg total) by mouth at bedtime as needed for sleep. (Patient taking differently: Take 5 mg by mouth at bedtime as needed (insomnia).) 90 tablet 0   Cholecalciferol 125 MCG (5000 UT) capsule Take 1 capsule (5,000 Units total) by mouth daily. (Patient taking differently: Take 5,000 Units by mouth in the morning. (0800)) 90 capsule 1   LORazepam (ATIVAN) 1 MG tablet Take 1 tablet (  1 mg total) by mouth every 8 (eight) hours as needed for anxiety. 90 tablet 3   nitrofurantoin, macrocrystal-monohydrate, (MACROBID) 100 MG capsule Take 1 capsule (100 mg total) by mouth 2 (two) times daily. 14 capsule 0   pioglitazone (ACTOS) 15 MG tablet Take 1 tablet (15 mg total) by mouth daily. (Patient taking differently: Take 15 mg by mouth daily. (0800)) 90 tablet 1   No facility-administered medications prior to visit.    ROS Review of Systems  Constitutional:  Negative for diaphoresis, fatigue and unexpected weight change.  HENT: Negative.    Eyes:  Negative for visual disturbance.  Respiratory:  Positive for cough. Negative for chest  tightness, shortness of breath and wheezing.        Chronic, unchanged, intermittent NP cough  Cardiovascular:  Negative for chest pain, palpitations and leg swelling.  Gastrointestinal:  Negative for abdominal pain, diarrhea, nausea and vomiting.  Genitourinary:  Positive for frequency. Negative for decreased urine volume, difficulty urinating, dysuria, hematuria and urgency.  Musculoskeletal:  Positive for arthralgias and gait problem. Negative for myalgias.  Skin:  Positive for rash.  Neurological:  Negative for weakness.  Hematological:  Negative for adenopathy. Does not bruise/bleed easily.  Psychiatric/Behavioral:  Positive for agitation and decreased concentration. Negative for confusion, dysphoric mood, self-injury, sleep disturbance and suicidal ideas. The patient is nervous/anxious and is hyperactive.    Objective:  BP 126/78 (BP Location: Left Arm, Patient Position: Sitting, Cuff Size: Large)   Pulse 100   Temp 98 F (36.7 C) (Oral)   Ht 5' 6.5" (1.689 m)   Wt 194 lb (88 kg)   SpO2 96%   BMI 30.84 kg/m   BP Readings from Last 3 Encounters:  04/21/21 126/78  03/03/21 132/70  10/24/20 106/70    Wt Readings from Last 3 Encounters:  04/21/21 194 lb (88 kg)  03/03/21 200 lb 6.4 oz (90.9 kg)  10/24/20 205 lb 14.6 oz (93.4 kg)    Physical Exam Vitals reviewed. Exam conducted with a chaperone present Terence Lux).  Constitutional:      Appearance: She is obese.  HENT:     Nose: Nose normal.     Mouth/Throat:     Mouth: Mucous membranes are moist.  Eyes:     Conjunctiva/sclera: Conjunctivae normal.  Cardiovascular:     Rate and Rhythm: Normal rate and regular rhythm.     Heart sounds: No murmur heard. Pulmonary:     Effort: Pulmonary effort is normal.     Breath sounds: No stridor. No wheezing, rhonchi or rales.  Abdominal:     General: Abdomen is protuberant. Bowel sounds are normal. There is no distension.     Palpations: Abdomen is soft. There is no  hepatomegaly, splenomegaly or mass.     Tenderness: There is no abdominal tenderness. There is no guarding.  Musculoskeletal:        General: Normal range of motion.     Cervical back: Neck supple.     Right lower leg: No edema.     Left lower leg: No edema.  Lymphadenopathy:     Cervical: No cervical adenopathy.  Skin:    General: Skin is warm and dry.     Coloration: Skin is not jaundiced or pale.     Findings: Erythema and rash present.     Comments: Under both breasts there are large swaths or wet, erythematous, macerated skin  There are linear, healed, scabbed excoriations over both lower extremities.  Neurological:  Mental Status: She is alert. Mental status is at baseline.     Gait: Gait abnormal.  Psychiatric:        Attention and Perception: Perception normal. She is inattentive.        Mood and Affect: Mood is anxious. Mood is not depressed.        Speech: She is communicative. Speech is tangential. Speech is not rapid and pressured, delayed or slurred.        Behavior: Behavior is not slowed or withdrawn.        Thought Content: Thought content is not paranoid or delusional. Thought content does not include homicidal or suicidal ideation.     Comments: Positive for psychomotor hyperactivity.    Lab Results  Component Value Date   WBC 4.1 03/03/2021   HGB 12.9 03/03/2021   HCT 38.3 03/03/2021   PLT 158.0 03/03/2021   GLUCOSE 150 (H) 03/03/2021   CHOL 127 04/21/2021   TRIG 118.0 04/21/2021   HDL 47.40 04/21/2021   LDLDIRECT 154.0 07/04/2018   LDLCALC 56 04/21/2021   ALT 48 (H) 03/03/2021   AST 47 (H) 03/03/2021   NA 141 03/03/2021   K 3.6 03/03/2021   CL 104 03/03/2021   CREATININE 0.78 03/03/2021   BUN 20 03/03/2021   CO2 28 03/03/2021   TSH 2.39 04/21/2021   INR 1.13 07/20/2018   HGBA1C 6.6 (H) 04/21/2021   MICROALBUR 1.8 01/10/2020    CT HEAD WO CONTRAST (5MM)  Result Date: 03/15/2021 CLINICAL DATA:  Hydrocephalus. VP shunt. Falling backwards  over the last month. EXAM: CT HEAD WITHOUT CONTRAST TECHNIQUE: Contiguous axial images were obtained from the base of the skull through the vertex without intravenous contrast. COMPARISON:  09/19/2020 FINDINGS: Brain: No focal abnormality affects the brainstem or cerebellum. 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 Ventricular size is stable. No evidence of shunt malfunction. Cerebral hemispheres show chronic small-vessel ischemic changes of the white matter and an old infarction at the right anterior thalamus/posterior limb internal capsule. No intracranial hemorrhage. No extra-axial fluid collection. Vascular: There is atherosclerotic calcification of the major vessels at the base of the brain. Skull: Negative other than the right parietal burr hole. Sinuses/Orbits: Clear/normal Other: No shunt discontinuity in the region. IMPRESSION: No evidence of shunt malfunction. No change in shadow position or in ventricular size. Chronic small-vessel ischemic changes of the cerebral hemispheric white matter. 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 Electronically Signed   By: Nelson Chimes M.D.   On: 03/15/2021 14:42    Assessment & Plan:   Mariell was seen today for annual exam, hypertension, hyperlipidemia and diabetes.  Diagnoses and all orders for this visit:  Flu vaccine need -     Flu Vaccine QUAD High Dose(Fluad)  GAD (generalized anxiety disorder) -     LORazepam (ATIVAN) 1 MG tablet; Take 1 tablet (1 mg total) by mouth every 8 (eight) hours as needed for anxiety. -     TSH; Future -     TSH  Tinea corporis -     fluconazole (DIFLUCAN) 100 MG tablet; Take 1 tablet (100 mg total) by mouth daily for 10 days.  Essential hypertension- Her blood pressure is adequately well controlled. -     TSH; Future -      Urinalysis, Routine w reflex microscopic; Future -  Urinalysis, Routine w reflex microscopic -     TSH  NASH (nonalcoholic steatohepatitis)- I recommended that she restart pioglitazone. -     pioglitazone (ACTOS) 15 MG tablet; Take 1 tablet (15 mg total) by mouth daily. (0800)  Type II diabetes mellitus with manifestations ([place])- Will restart pioglitazone. -     Hemoglobin A1c; Future -     Hemoglobin A1c -     pioglitazone (ACTOS) 15 MG tablet; Take 1 tablet (15 mg total) by mouth daily. (0800)  Routine general medical examination at a health care facility- Exam completed, labs reviewed, vaccines reviewed and updated, no cancer screenings are indicated, patient education was given.  Vitamin B12 deficiency anemia due to intrinsic factor deficiency- Her H&H, B12, and folate are normal now. -     Vitamin B12; Future -     Folate; Future -     Folate -     Vitamin B12  Vitamin D deficiency- Her vitamin D level is normal. -     VITAMIN D 25 Hydroxy (Vit-D Deficiency, Fractures); Future -     VITAMIN D 25 Hydroxy (Vit-D Deficiency, Fractures)  Hyperlipidemia with target LDL less than 100- LDL goal achieved. Doing well on the statin  -     Lipid panel; Future -     TSH; Future -     TSH -     Lipid panel  Primary osteoarthritis of both knees -     Ambulatory referral to Sports Medicine  I have discontinued Clemon Chambers Cholecalciferol and nitrofurantoin (macrocrystal-monohydrate). I have also changed her pioglitazone. Additionally, I am having her start on fluconazole. Lastly, I am having her maintain her pantoprazole, prazosin, SUMAtriptan, solifenacin, lidocaine, mirabegron ER, desvenlafaxine, atorvastatin, zolpidem, Spiriva Respimat, amLODipine, bisacodyl, acetaminophen, levocetirizine, linaclotide, Tea Tree Oil, and LORazepam.  Meds ordered this encounter  Medications   LORazepam (ATIVAN) 1 MG tablet    Sig: Take 1 tablet (1 mg total) by mouth every 8 (eight) hours as  needed for anxiety.    Dispense:  90 tablet    Refill:  3   fluconazole (DIFLUCAN) 100 MG tablet    Sig: Take 1 tablet (100 mg total) by mouth daily for 10 days.    Dispense:  10 tablet    Refill:  0   pioglitazone (ACTOS) 15 MG tablet    Sig: Take 1 tablet (15 mg total) by mouth daily. (0800)    Dispense:  90 tablet    Refill:  1     Follow-up: Return in about 3 months (around 07/21/2021).  [name], MD

## 2021-04-21 NOTE — Patient Instructions (Signed)
Health Maintenance, Female Adopting a healthy lifestyle and getting preventive care are important in promoting health and wellness. Ask your health care provider about: The right schedule for you to have regular tests and exams. Things you can do on your own to prevent diseases and keep yourself healthy. What should I know about diet, weight, and exercise? Eat a healthy diet  Eat a diet that includes plenty of vegetables, fruits, low-fat dairy products, and lean protein. Do not eat a lot of foods that are high in solid fats, added sugars, or sodium. Maintain a healthy weight Body mass index (BMI) is used to identify weight problems. It estimates body fat based on height and weight. Your health care provider can help determine your BMI and help you achieve or maintain a healthy weight. Get regular exercise Get regular exercise. This is one of the most important things you can do for your health. Most adults should: Exercise for at least 150 minutes each week. The exercise should increase your heart rate and make you sweat (moderate-intensity exercise). Do strengthening exercises at least twice a week. This is in addition to the moderate-intensity exercise. Spend less time sitting. Even light physical activity can be beneficial. Watch cholesterol and blood lipids Have your blood tested for lipids and cholesterol at 74 years of age, then have this test every 5 years. Have your cholesterol levels checked more often if: Your lipid or cholesterol levels are high. You are older than 74 years of age. You are at high risk for heart disease. What should I know about cancer screening? Depending on your health history and family history, you may need to have cancer screening at various ages. This may include screening for: Breast cancer. Cervical cancer. Colorectal cancer. Skin cancer. Lung cancer. What should I know about heart disease, diabetes, and high blood pressure? Blood pressure and heart  disease High blood pressure causes heart disease and increases the risk of stroke. This is more likely to develop in people who have high blood pressure readings, are of African descent, or are overweight. Have your blood pressure checked: Every 3-5 years if you are 18-39 years of age. Every year if you are 40 years old or older. Diabetes Have regular diabetes screenings. This checks your fasting blood sugar level. Have the screening done: Once every three years after age 40 if you are at a normal weight and have a low risk for diabetes. More often and at a younger age if you are overweight or have a high risk for diabetes. What should I know about preventing infection? Hepatitis B If you have a higher risk for hepatitis B, you should be screened for this virus. Talk with your health care provider to find out if you are at risk for hepatitis B infection. Hepatitis C Testing is recommended for: Everyone born from 1945 through 1965. Anyone with known risk factors for hepatitis C. Sexually transmitted infections (STIs) Get screened for STIs, including gonorrhea and chlamydia, if: You are sexually active and are younger than 74 years of age. You are older than 74 years of age and your health care provider tells you that you are at risk for this type of infection. Your sexual activity has changed since you were last screened, and you are at increased risk for chlamydia or gonorrhea. Ask your health care provider if you are at risk. Ask your health care provider about whether you are at high risk for HIV. Your health care provider may recommend a prescription medicine   to help prevent HIV infection. If you choose to take medicine to prevent HIV, you should first get tested for HIV. You should then be tested every 3 months for as long as you are taking the medicine. Pregnancy If you are about to stop having your period (premenopausal) and you may become pregnant, seek counseling before you get  pregnant. Take 400 to 800 micrograms (mcg) of folic acid every day if you become pregnant. Ask for birth control (contraception) if you want to prevent pregnancy. Osteoporosis and menopause Osteoporosis is a disease in which the bones lose minerals and strength with aging. This can result in bone fractures. If you are 65 years old or older, or if you are at risk for osteoporosis and fractures, ask your health care provider if you should: Be screened for bone loss. Take a calcium or vitamin D supplement to lower your risk of fractures. Be given hormone replacement therapy (HRT) to treat symptoms of menopause. Follow these instructions at home: Lifestyle Do not use any products that contain nicotine or tobacco, such as cigarettes, e-cigarettes, and chewing tobacco. If you need help quitting, ask your health care provider. Do not use street drugs. Do not share needles. Ask your health care provider for help if you need support or information about quitting drugs. Alcohol use Do not drink alcohol if: Your health care provider tells you not to drink. You are pregnant, may be pregnant, or are planning to become pregnant. If you drink alcohol: Limit how much you use to 0-1 drink a day. Limit intake if you are breastfeeding. Be aware of how much alcohol is in your drink. In the [place] one drink equals one 12 oz bottle of beer (355 mL), one 5 oz glass of wine (148 mL), or one 1 oz glass of hard liquor (44 mL). General instructions Schedule regular health, dental, and eye exams. Stay current with your vaccines. Tell your health care provider if: You often feel depressed. You have ever been abused or do not feel safe at home. Summary Adopting a healthy lifestyle and getting preventive care are important in promoting health and wellness. Follow your health care provider's instructions about healthy diet, exercising, and getting tested or screened for diseases. Follow your health care provider's  instructions on monitoring your cholesterol and blood pressure. This information is not intended to replace advice given to you by your health care provider. Make sure you discuss any questions you have with your health care provider. Document Revised: 09/26/2020 Document Reviewed: 07/12/2018 Elsevier Patient Education  2022 [employer]  

## 2021-04-22 ENCOUNTER — Encounter: Payer: Self-pay | Admitting: Internal Medicine

## 2021-04-22 LAB — URINALYSIS, ROUTINE W REFLEX MICROSCOPIC
Bilirubin Urine: NEGATIVE
Ketones, ur: NEGATIVE
Nitrite: POSITIVE — AB
Specific Gravity, Urine: 1.02 (ref 1.000–1.030)
Total Protein, Urine: NEGATIVE
Urine Glucose: NEGATIVE
Urobilinogen, UA: 0.2 — AB (ref 0.0–1.0)
pH: 6 (ref 5.0–8.0)

## 2021-04-22 MED ORDER — PIOGLITAZONE HCL 15 MG PO TABS: 15.0000 mg | ORAL_TABLET | Freq: Every day | ORAL | 1 refills | Status: DC

## 2021-04-25 ENCOUNTER — Encounter: Payer: Self-pay | Admitting: Internal Medicine

## 2021-04-28 ENCOUNTER — Other Ambulatory Visit: Payer: Self-pay | Admitting: Internal Medicine

## 2021-04-28 DIAGNOSIS — N3281 Overactive bladder: Secondary | ICD-10-CM

## 2021-04-28 DIAGNOSIS — E118 Type 2 diabetes mellitus with unspecified complications: Secondary | ICD-10-CM

## 2021-04-28 MED ORDER — RYBELSUS 3 MG PO TABS: 3.0000 mg | ORAL_TABLET | Freq: Every day | ORAL | 0 refills | Status: AC

## 2021-04-29 ENCOUNTER — Ambulatory Visit (INDEPENDENT_AMBULATORY_CARE_PROVIDER_SITE_OTHER): Payer: Medicare PPO

## 2021-04-29 ENCOUNTER — Ambulatory Visit: Payer: Medicare PPO | Admitting: Sports Medicine

## 2021-04-29 ENCOUNTER — Other Ambulatory Visit: Payer: Self-pay | Admitting: *Deleted

## 2021-04-29 ENCOUNTER — Other Ambulatory Visit: Payer: Self-pay

## 2021-04-29 VITALS — BP 122/70 | HR 94

## 2021-04-29 VITALS — BP 122/70 | HR 94 | Temp 98.2°F | Ht 67.0 in | Wt 194.0 lb

## 2021-04-29 DIAGNOSIS — M25561 Pain in right knee: Secondary | ICD-10-CM

## 2021-04-29 DIAGNOSIS — M25562 Pain in left knee: Secondary | ICD-10-CM | POA: Diagnosis not present

## 2021-04-29 DIAGNOSIS — M542 Cervicalgia: Secondary | ICD-10-CM | POA: Diagnosis not present

## 2021-04-29 DIAGNOSIS — M25512 Pain in left shoulder: Secondary | ICD-10-CM

## 2021-04-29 DIAGNOSIS — Z0000 Encounter for general adult medical examination without abnormal findings: Secondary | ICD-10-CM | POA: Diagnosis not present

## 2021-04-29 DIAGNOSIS — Z9181 History of falling: Secondary | ICD-10-CM | POA: Diagnosis not present

## 2021-04-29 DIAGNOSIS — M170 Bilateral primary osteoarthritis of knee: Secondary | ICD-10-CM | POA: Diagnosis not present

## 2021-04-29 DIAGNOSIS — M25511 Pain in right shoulder: Secondary | ICD-10-CM | POA: Diagnosis not present

## 2021-04-29 MED ORDER — ACETAMINOPHEN 325 MG PO TABS: ORAL_TABLET | ORAL | 0 refills | Status: DC

## 2021-04-29 NOTE — Patient Instructions (Signed)
[name] , Thank you for taking time to come for your Medicare Wellness Visit. I appreciate your ongoing commitment to your health goals. Please review the following plan we discussed and let me know if I can assist you in the future.   Screening recommendations/referrals: Colonoscopy: 10/24/2020; due every 3 years Mammogram: 09/29/2020; due every 1-2 years Bone Density: never done Recommended yearly ophthalmology/optometry visit for glaucoma screening and checkup Recommended yearly dental visit for hygiene and checkup  Vaccinations: Influenza vaccine: 04/21/2021 Pneumococcal vaccine: 12/27/2013, 04/14/2016 Tdap vaccine: 02/07/2018; due every 10 years Shingles vaccine: 04/24/2019, 07/28/2019   Covid-19: 08/13/2019, 09/13/2019, 06/11/2020  Advanced directives:Yes; documents on file  Conditions/risks identified: Yes; Continue to be proactive with my health and enjoy life.  Next appointment: Please schedule your next Medicare Wellness Visit with your Nurse Health Advisor in 1 year by calling [phone].   Preventive Care 68 Years and Older, Female Preventive care refers to lifestyle choices and visits with your health care provider that can promote health and wellness. What does preventive care include? A yearly physical exam. This is also called an annual well check. Dental exams once or twice a year. Routine eye exams. Ask your health care provider how often you should have your eyes checked. Personal lifestyle choices, including: Daily care of your teeth and gums. Regular physical activity. Eating a healthy diet. Avoiding tobacco and drug use. Limiting alcohol use. Practicing safe sex. Taking low-dose aspirin every day. Taking vitamin and mineral supplements as recommended by your health care provider. What happens during an annual well check? The services and screenings done by your health care provider during your annual well check will depend on your age, overall health,  lifestyle risk factors, and family history of disease. Counseling  Your health care provider may ask you questions about your: Alcohol use. Tobacco use. Drug use. Emotional well-being. Home and relationship well-being. Sexual activity. Eating habits. History of falls. Memory and ability to understand (cognition). Work and work Statistician. Reproductive health. Screening  You may have the following tests or measurements: Height, weight, and BMI. Blood pressure. Lipid and cholesterol levels. These may be checked every 5 years, or more frequently if you are over 5 years old. Skin check. Lung cancer screening. You may have this screening every year starting at age 55 if you have a 30-pack-year history of smoking and currently smoke or have quit within the past 15 years. Fecal occult blood test (FOBT) of the stool. You may have this test every year starting at age 23. Flexible sigmoidoscopy or colonoscopy. You may have a sigmoidoscopy every 5 years or a colonoscopy every 10 years starting at age 74. Hepatitis C blood test. Hepatitis B blood test. Sexually transmitted disease (STD) testing. Diabetes screening. This is done by checking your blood sugar (glucose) after you have not eaten for a while (fasting). You may have this done every 1-3 years. Bone density scan. This is done to screen for osteoporosis. You may have this done starting at age 85. Mammogram. This may be done every 1-2 years. Talk to your health care provider about how often you should have regular mammograms. Talk with your health care provider about your test results, treatment options, and if necessary, the need for more tests. Vaccines  Your health care provider may recommend certain vaccines, such as: Influenza vaccine. This is recommended every year. Tetanus, diphtheria, and acellular pertussis (Tdap, Td) vaccine. You may need a Td booster every 10 years. Zoster vaccine. You may need this after  age  32. Pneumococcal 13-valent conjugate (PCV13) vaccine. One dose is recommended after age 17. Pneumococcal polysaccharide (PPSV23) vaccine. One dose is recommended after age 54. Talk to your health care provider about which screenings and vaccines you need and how often you need them. This information is not intended to replace advice given to you by your health care provider. Make sure you discuss any questions you have with your health care provider. Document Released: 08/15/2015 Document Revised: 04/07/2016 Document Reviewed: 05/20/2015 Elsevier Interactive Patient Education  2017 [place] Prevention in the Home Falls can cause injuries. They can happen to people of all ages. There are many things you can do to make your home safe and to help prevent falls. What can I do on the outside of my home? Regularly fix the edges of walkways and driveways and fix any cracks. Remove anything that might make you trip as you walk through a door, such as a raised step or threshold. Trim any bushes or trees on the path to your home. Use bright outdoor lighting. Clear any walking paths of anything that might make someone trip, such as rocks or tools. Regularly check to see if handrails are loose or broken. Make sure that both sides of any steps have handrails. Any raised decks and porches should have guardrails on the edges. Have any leaves, snow, or ice cleared regularly. Use sand or salt on walking paths during winter. Clean up any spills in your garage right away. This includes oil or grease spills. What can I do in the bathroom? Use night lights. Install grab bars by the toilet and in the tub and shower. Do not use towel bars as grab bars. Use non-skid mats or decals in the tub or shower. If you need to sit down in the shower, use a plastic, non-slip stool. Keep the floor dry. Clean up any water that spills on the floor as soon as it happens. Remove soap buildup in the tub or shower  regularly. Attach bath mats securely with double-sided non-slip rug tape. Do not have throw rugs and other things on the floor that can make you trip. What can I do in the bedroom? Use night lights. Make sure that you have a light by your bed that is easy to reach. Do not use any sheets or blankets that are too big for your bed. They should not hang down onto the floor. Have a firm chair that has side arms. You can use this for support while you get dressed. Do not have throw rugs and other things on the floor that can make you trip. What can I do in the kitchen? Clean up any spills right away. Avoid walking on wet floors. Keep items that you use a lot in easy-to-reach places. If you need to reach something above you, use a strong step stool that has a grab bar. Keep electrical cords out of the way. Do not use floor polish or wax that makes floors slippery. If you must use wax, use non-skid floor wax. Do not have throw rugs and other things on the floor that can make you trip. What can I do with my stairs? Do not leave any items on the stairs. Make sure that there are handrails on both sides of the stairs and use them. Fix handrails that are broken or loose. Make sure that handrails are as long as the stairways. Check any carpeting to make sure that it is firmly attached to the stairs.  Fix any carpet that is loose or worn. Avoid having throw rugs at the top or bottom of the stairs. If you do have throw rugs, attach them to the floor with carpet tape. Make sure that you have a light switch at the top of the stairs and the bottom of the stairs. If you do not have them, ask someone to add them for you. What else can I do to help prevent falls? Wear shoes that: Do not have high heels. Have rubber bottoms. Are comfortable and fit you well. Are closed at the toe. Do not wear sandals. If you use a stepladder: Make sure that it is fully opened. Do not climb a closed stepladder. Make sure that  both sides of the stepladder are locked into place. Ask someone to hold it for you, if possible. Clearly mark and make sure that you can see: Any grab bars or handrails. First and last steps. Where the edge of each step is. Use tools that help you move around (mobility aids) if they are needed. These include: Canes. Walkers. Scooters. Crutches. Turn on the lights when you go into a dark area. Replace any light bulbs as soon as they burn out. Set up your furniture so you have a clear path. Avoid moving your furniture around. If any of your floors are uneven, fix them. If there are any pets around you, be aware of where they are. Review your medicines with your doctor. Some medicines can make you feel dizzy. This can increase your chance of falling. Ask your doctor what other things that you can do to help prevent falls. This information is not intended to replace advice given to you by your health care provider. Make sure you discuss any questions you have with your health care provider. Document Released: 05/15/2009 Document Revised: 12/25/2015 Document Reviewed: 08/23/2014 Elsevier Interactive Patient Education  2017 [employer].

## 2021-04-29 NOTE — Progress Notes (Signed)
Subjective:   [name] is a 75 y.o. female who presents for Medicare Annual (Subsequent) preventive examination.  Review of Systems     Cardiac Risk Factors include: advanced age (>57men, >71 women);diabetes mellitus;family history of premature cardiovascular disease;hypertension;dyslipidemia     Objective:    Today's Vitals   04/29/21 1547 04/29/21 1629  BP: 122/70   Pulse: 94   Temp: 98.2 F (36.8 C)   SpO2: 92%   Weight: 194 lb (88 kg)   Height: 5\' 7"  (1.702 m)   PainSc:  10-Worst pain ever   Body mass index is 30.38 kg/m.  Advanced Directives 04/29/2021 10/24/2020 01/10/2020 09/16/2018 09/16/2018 07/20/2018 07/20/2018  Does Patient Have a Medical Advance Directive? Yes Yes Yes No Yes - Yes  Type of Advance Directive - [place];Living will [place];Living will Healthcare Power of [place] of [place]  Does patient want to make changes to medical advance directive? No - Patient declined - No - Patient declined No - Patient declined No - Patient declined No - Patient declined -  Copy of [place] in Chart? - Yes - validated most recent copy scanned in chart (See row information) - No - copy requested No - copy requested - No - copy requested  Would patient like information on creating a medical advance directive? - - - No - Patient declined - - -  Pre-existing out of facility DNR order (yellow form or pink MOST form) - - - - - - -    Current Medications (verified) Outpatient Encounter Medications as of 04/29/2021  Medication Sig   acetaminophen (TYLENOL) 325 MG tablet Take 650 mg by mouth 2 (two) times daily as needed (pain).   amLODipine (NORVASC) 2.5 MG tablet Take 2.5 mg by mouth in the morning. (0900)   ARIPiprazole (ABILIFY) 10 MG tablet Take 10 mg by mouth daily. Dr. Toy Care; need correct dosage   atorvastatin (LIPITOR) 40 MG tablet  Take 1 tablet (40 mg total) by mouth daily. (Patient taking differently: Take 40 mg by mouth every evening. (2100))   bisacodyl (DULCOLAX) 10 MG suppository Place 10 mg rectally daily as needed for moderate constipation.   desvenlafaxine (PRISTIQ) 100 MG 24 hr tablet Take 100 mg by mouth every evening. (2100)   fluconazole (DIFLUCAN) 100 MG tablet Take 1 tablet (100 mg total) by mouth daily for 10 days.   levocetirizine (XYZAL) 5 MG tablet Take 2 tablets (10 mg total) by mouth every evening.   lidocaine (LIDODERM) 5 % Place 1 patch onto the skin daily. Remove & Discard patch within 12 hours or as directed by MD (Patient taking differently: Place 1 patch onto the skin every 12 (twelve) hours as needed (pain). Remove & Discard patch within 12 hours or as directed by MD)   linaclotide (LINZESS) 145 MCG CAPS capsule Take 1 capsule (145 mcg total) by mouth daily before breakfast.   LORazepam (ATIVAN) 1 MG tablet Take 1 tablet (1 mg total) by mouth every 8 (eight) hours as needed for anxiety.   mirabegron ER (MYRBETRIQ) 25 MG TB24 tablet Take 1 tablet (25 mg total) by mouth daily. (Patient taking differently: Take 25 mg by mouth daily. (0900))   pantoprazole (PROTONIX) 40 MG tablet Take 1 tablet (40 mg total) by mouth daily. (Patient taking differently: Take 40 mg by mouth in the morning. (0800))   pioglitazone (ACTOS) 15 MG tablet Take 1 tablet (  15 mg total) by mouth daily. (0800)   prazosin (MINIPRESS) 2 MG capsule Take 2 mg by mouth at bedtime. (2100)   Semaglutide (RYBELSUS) 3 MG TABS Take 3 mg by mouth daily.   solifenacin (VESICARE) 5 MG tablet Take 1 tablet (5 mg total) by mouth daily. (Patient taking differently: Take 5 mg by mouth in the morning. (0800))   SUMAtriptan (IMITREX) 50 MG tablet Take 1 tablet (50 mg total) by mouth every 2 (two) hours as needed for migraine. May repeat in 2 hours if headache persists or recurs.   Tiotropium Bromide Monohydrate (SPIRIVA RESPIMAT) 2.5 MCG/ACT AERS Inhale  2 puffs into the lungs daily. (Patient taking differently: Inhale 2 puffs into the lungs daily. (0900))   No facility-administered encounter medications on file as of 04/29/2021.    Allergies (verified) Metformin and related, Ampicillin, Onion, Ace inhibitors, Erythromycin, Penicillins, and Topamax [topiramate]   History: Past Medical History:  Diagnosis Date   Acid reflux disease    Acute kidney injury ([place])    Anxiety    Chronic bronchitis ([employer])    Depression    Diabetes mellitus without complication ([place])    Dizziness    Falls    High cholesterol    Hydrocephalus ([facility])    Hypertension    Hypotension    MVP (mitral valve prolapse)    Sinus bradycardia    Transaminitis    Past Surgical History:  Procedure Laterality Date   BRAIN SURGERY     BREAST BIOPSY Left    CHOLECYSTECTOMY     COLONOSCOPY WITH PROPOFOL N/A 10/24/2020   Procedure: COLONOSCOPY WITH PROPOFOL;  Surgeon: Carol Ada, MD;  Location: [facility] ENDOSCOPY;  Service: Endoscopy;  Laterality: N/A;   ERCP N/A 02/28/2014   Procedure: ENDOSCOPIC RETROGRADE CHOLANGIOPANCREATOGRAPHY (ERCP);  Surgeon: Gatha Mayer, MD;  Location: [facility] ENDOSCOPY;  Service: Endoscopy;  Laterality: N/A;  talked to tiffany from or /ebp   ESOPHAGOGASTRODUODENOSCOPY N/A 02/28/2014   Procedure: ESOPHAGOGASTRODUODENOSCOPY (EGD);  Surgeon: Gatha Mayer, MD;  Location: [facility] ENDOSCOPY;  Service: Endoscopy;  Laterality: N/A;   POLYPECTOMY  10/24/2020   Procedure: POLYPECTOMY;  Surgeon: Carol Ada, MD;  Location: [facility] ENDOSCOPY;  Service: Endoscopy;;   VENTRICULOPERITONEAL SHUNT     right occipital   Family History  Problem Relation Age of Onset   Heart disease Mother    Stroke Mother    Hypertension Mother    Breast cancer Mother    Alcohol abuse Father    Diabetes Father    Breast cancer Maternal Aunt    Breast cancer Maternal Grandmother    Cancer Neg Hx    Social History   Socioeconomic History   Marital status: Divorced    Spouse name:  Not on file   Number of children: Not on file   Years of education: Not on file   Highest education level: Not on file  Occupational History   Not on file  Tobacco Use   Smoking status: Never   Smokeless tobacco: Never  Vaping Use   Vaping Use: Never used  Substance and Sexual Activity   Alcohol use: No   Drug use: No   Sexual activity: Not Currently  Other Topics Concern   Not on file  Social History Narrative   Lives at [place].  Lives with cat/ Maggie.  Education HS.  Children none.  Divorced.  Retired.   Social Determinants of Health   Financial Resource Strain: Low Risk    Difficulty of Paying Living Expenses:  Not hard at all  Food Insecurity: No Food Insecurity   Worried About Charity fundraiser in the Last Year: Never true   Ran Out of Food in the Last Year: Never true  Transportation Needs: No Transportation Needs   Lack of Transportation (Medical): No   Lack of Transportation (Non-Medical): No  Physical Activity: Inactive   Days of Exercise per Week: 0 days   Minutes of Exercise per Session: 0 min  Stress: No Stress Concern Present   Feeling of Stress : Not at all  Social Connections: Socially Isolated   Frequency of Communication with Friends and Family: More than three times a week   Frequency of Social Gatherings with Friends and Family: Three times a week   Attends Religious Services: Never   Active Member of Clubs or Organizations: No   Attends Archivist Meetings: Never   Marital Status: Divorced    Tobacco Counseling Counseling given: Not Answered   Clinical Intake:  Pre-visit preparation completed: Yes  Pain : 0-10 Pain Score: 10-Worst pain ever Pain Type: Chronic pain Pain Location: Knee Pain Orientation: Left, Right Pain Descriptors / Indicators: Discomfort, Dull Pain Onset: More than a month ago Pain Frequency: Constant Pain Relieving Factors: Tylenol Effect of Pain on Daily Activities: Pain can diminish job  performance, lower motivation to exercise, and prevent you from completing daily tasks. Pain produces disability and affects the quality of life.  Pain Relieving Factors: Tylenol  BMI - recorded: 30.38 Nutritional Status: BMI > 30  Obese Nutritional Risks: None Diabetes: Yes CBG done?: No Did pt. bring in CBG monitor from home?: No  What is the last grade level you completed in school?: High School Grdauate; some college courses  Diabetic? yes  Interpreter Needed?: No  Information entered by :: Lisette Abu, LPN   Activities of Daily Living In your present state of health, do you have any difficulty performing the following activities: 04/29/2021 04/21/2021  Hearing? N N  Vision? N N  Difficulty concentrating or making decisions? Y N  Walking or climbing stairs? Y N  Dressing or bathing? Y N  Doing errands, shopping? Y N  Preparing Food and eating ? N -  Using the Toilet? Y -  In the past six months, have you accidently leaked urine? Y -  Do you have problems with loss of bowel control? Y -  Managing your Medications? Y -  Managing your Finances? Y -  Housekeeping or managing your Housekeeping? Y -  Some recent data might be hidden    Patient Care Team: Janith Lima, MD as PCP - General  Indicate any recent Medical Services you may have received from other than [facility] providers in the past year (date may be approximate).     Assessment:   This is a routine wellness examination for [facility].  Hearing/Vision screen No results found.  Dietary issues and exercise activities discussed: Current Exercise Habits: The patient does not participate in regular exercise at present, Exercise limited by: neurologic condition(s)   Goals Addressed             This Visit's Progress    Patient Stated       Continue to be proactive with my health and enjoy life.       Depression Screen PHQ 2/9 Scores 04/29/2021 07/28/2020 01/10/2020 09/27/2018 06/27/2018 03/07/2018 02/15/2018   PHQ - 2 Score 0 0 1 2 0 0 0  PHQ- 9 Score - 0 - 12 12 12  0  Fall Risk Fall Risk  04/29/2021 04/21/2021 01/10/2020 04/24/2019 12/19/2017  Falls in the past year? 1 1 0 1 Yes  Number falls in past yr: 1 0 0 0 1  Injury with Fall? 0 0 0 1 No  Risk for fall due to : History of fall(s);Impaired balance/gait - History of fall(s) Impaired balance/gait;Impaired mobility -  Follow up - - Falls evaluation completed Education provided;Falls evaluation completed Falls evaluation completed    FALL RISK PREVENTION PERTAINING TO THE HOME:  Any stairs in or around the home? No  If so, are there any without handrails? No  Home free of loose throw rugs in walkways, pet beds, electrical cords, etc? Yes  Adequate lighting in your home to reduce risk of falls? Yes   ASSISTIVE DEVICES UTILIZED TO PREVENT FALLS:  Life alert? Yes  Use of a cane, walker or w/c? Yes  Grab bars in the bathroom? Yes  Shower chair or bench in shower? Yes  Elevated toilet seat or a handicapped toilet? Yes   TIMED UP AND GO:  Was the test performed? Yes .  Length of time to ambulate 10 feet: 13 sec.   Gait unsteady without use of assistive device, provider informed and interventions were implemented  Cognitive Function: MMSE - Mini Mental State Exam 09/27/2018  Orientation to time 5  Orientation to Place 5  Registration 3  Attention/ Calculation 2  Recall 3  Language- name 2 objects 2  Language- repeat 1  Language- follow 3 step command 3  Language- read & follow direction 1  Write a sentence 1  Copy design 1  Total score 27     6CIT Screen 01/10/2020  What Year? 0 points  What month? 0 points  What time? 3 points  Count back from 20 0 points  Months in reverse 0 points  Repeat phrase 0 points  Total Score 3    Immunizations Immunization History  Administered Date(s) Administered   Fluad Quad(high Dose 65+) 04/24/2019, 04/21/2021   Influenza Split 08/08/2013   Influenza Whole 05/22/2010   Influenza,  High Dose Seasonal PF 04/16/2016, 04/25/2017, 03/31/2018   Influenza,inj,Quad PF,6+ Mos 05/13/2014   Influenza-Unspecified 06/03/2015, 04/12/2020   Moderna Sars-Covid-2 Vaccination 08/13/2019, 09/13/2019, 06/11/2020, 12/10/2020   Pneumococcal Conjugate-13 12/27/2013   Pneumococcal Polysaccharide-23 04/14/2016   Td 05/22/2010   Tdap 02/07/2018   Zoster Recombinat (Shingrix) 04/24/2019, 07/28/2019   Zoster, Live 04/14/2016    TDAP status: Up to date  Flu Vaccine status: Up to date  Pneumococcal vaccine status: Up to date  Covid-19 vaccine status: Completed vaccines  Qualifies for Shingles Vaccine? Yes   Zostavax completed Yes   Shingrix Completed?: Yes  Screening Tests Health Maintenance  Topic Date Due   OPHTHALMOLOGY EXAM  07/10/2020   FOOT EXAM  09/16/2020   COVID-19 Vaccine (5 - Booster for Moderna series) 04/12/2021   HEMOGLOBIN A1C  10/19/2021   Fecal DNA (Cologuard)  10/02/2023   TETANUS/TDAP  02/08/2028   INFLUENZA VACCINE  Completed   DEXA SCAN  Completed   Hepatitis C Screening  Completed   Zoster Vaccines- Shingrix  Completed   HPV VACCINES  Aged Out    Health Maintenance  Health Maintenance Due  Topic Date Due   OPHTHALMOLOGY EXAM  07/10/2020   FOOT EXAM  09/16/2020   COVID-19 Vaccine (5 - Booster for Moderna series) 04/12/2021    Colorectal cancer screening: Type of screening: Colonoscopy. Completed 10/24/2020. Repeat every 3 years  Mammogram status: Completed 09/29/2020. Repeat every year  Bone density status: never done  Lung Cancer Screening: (Low Dose CT Chest recommended if Age 86-80 years, 30 pack-year currently smoking OR have quit w/in 15years.) does not qualify.   Lung Cancer Screening Referral: no  Additional Screening:  Hepatitis C Screening: does qualify; Completed: yes  Vision Screening: Recommended annual ophthalmology exams for early detection of glaucoma and other disorders of the eye. Is the patient up to date with their annual  eye exam?  Yes  Who is the provider or what is the name of the office in which the patient attends annual eye exams? Marica Otter, OD. If pt is not established with a provider, would they like to be referred to a provider to establish care? No .   Dental Screening: Recommended annual dental exams for proper oral hygiene  Community Resource Referral / Chronic Care Management: CRR required this visit?  No   CCM required this visit?  No      Plan:     I have personally reviewed and noted the following in the patient's chart:   Medical and social history Use of alcohol, tobacco or illicit drugs  Current medications and supplements including opioid prescriptions.  Functional ability and status Nutritional status Physical activity Advanced directives List of other physicians Hospitalizations, surgeries, and ER visits in previous 12 months Vitals Screenings to include cognitive, depression, and falls Referrals and appointments  In addition, I have reviewed and discussed with patient certain preventive protocols, quality metrics, and best practice recommendations. A written personalized care plan for preventive services as well as general preventive health recommendations were provided to patient.     Sheral Flow, LPN   [id number]   Nurse Notes: none

## 2021-04-29 NOTE — Patient Instructions (Addendum)
Nice to meet you! Tylenol 325mg  twice daily for 2 weeks.  Referral entered for Occupational Therapy.   Follow-up in 4 weeks

## 2021-04-29 NOTE — Progress Notes (Signed)
[name] D.[place] [facility] [place] Phone: [phone]   Assessment and Plan:     1. Bilateral primary osteoarthritis of knee 2. Acute pain of both knees -Acute worsening of chronic condition, initial sports medicine visit - X-rays obtained in clinic.  My interpretation: No acute fracture.  Decreased joint space consistent with moderate to severe tricompartmental osteoarthritis most severe in left lateral compartment and right medial compartment - Start Tylenol 3 25 mg twice daily x2 weeks and then may return to as needed use - Start occupational therapy to decrease fall risk as well as increase knee AROM - DG Knee Bilateral Standing AP; Future   3. Pain of both shoulder joints -Acute from fall today, initial sports medicine visit - X-ray obtained in clinic.  My interpretation: No acute fracture, cortical irregularities consistent with glenohumeral and acromioclavicular arthritis - Start Tylenol 3 25 mg twice daily x2 weeks and then may return to as needed use - Start occupational therapy to decrease fall risk as well as increase shoulder AROM - DG Shoulder Left; Future - DG Shoulder Right; Future  4. Neck pain -Acute from fall today, initial sports medicine visit - X-ray obtained in clinic.  My interpretation: No acute fracture, severe DDD throughout cervical spine with loss of lordosis, decreased disc space - DG Cervical Spine Complete; Future  - Start Tylenol 3 25 mg twice daily x2 weeks and then may return to as needed use - Start occupational therapy to decrease fall risk  Pertinent previous records reviewed include prior knee x-ray, telephone encounter, PCP note  Presents with healthcare POA who helped provide HPI  Follow Up: In 4 weeks for reevaluation   Subjective:    Chief Complaint: bilateral knee pain   HPI:   04/29/21 Patient states she has been having bilateral knee pain going on for years but  worse for months. Had a fall this morning and has worse bilateral knee pain, shoulder pain and neck pain.  Had additional fall over the weekend.  Patient had to call for help in order to be helped up.  Describes an aching pain in her neck and shoulders bilaterally.  Denies numbness/feeling/weakness in lower extremities, saddle paresthesia, urinary incontinence.  In the past patient has been using Tylenol as needed which provides moderate relief of pain, however patient often forgets to ask for pain medication and it is listed as as needed at her facility.  Patient is accompanied by healthcare POA he will provide HPI.    Relevant Historical Information: Hypertension, DM type II, bilateral knee OA  Additional pertinent review of systems negative.   Current Outpatient Medications:    acetaminophen (TYLENOL) 325 MG tablet, Take 650 mg by mouth 2 (two) times daily as needed (pain)., Disp: , Rfl:    amLODipine (NORVASC) 2.5 MG tablet, Take 2.5 mg by mouth in the morning. (0900), Disp: , Rfl:    atorvastatin (LIPITOR) 40 MG tablet, Take 1 tablet (40 mg total) by mouth daily. (Patient taking differently: Take 40 mg by mouth every evening. (2100)), Disp: 90 tablet, Rfl: 1   bisacodyl (DULCOLAX) 10 MG suppository, Place 10 mg rectally daily as needed for moderate constipation., Disp: , Rfl:    desvenlafaxine (PRISTIQ) 100 MG 24 hr tablet, Take 100 mg by mouth every evening. (2100), Disp: , Rfl:    fluconazole (DIFLUCAN) 100 MG tablet, Take 1 tablet (100 mg total) by mouth daily for 10 days., Disp: 10 tablet, Rfl: 0  levocetirizine (XYZAL) 5 MG tablet, Take 2 tablets (10 mg total) by mouth every evening., Disp: 180 tablet, Rfl: 1   lidocaine (LIDODERM) 5 %, Place 1 patch onto the skin daily. Remove & Discard patch within 12 hours or as directed by MD (Patient taking differently: Place 1 patch onto the skin every 12 (twelve) hours as needed (pain). Remove & Discard patch within 12 hours or as directed by  MD), Disp: 30 patch, Rfl: 5   linaclotide (LINZESS) 145 MCG CAPS capsule, Take 1 capsule (145 mcg total) by mouth daily before breakfast., Disp: 90 capsule, Rfl: 1   LORazepam (ATIVAN) 1 MG tablet, Take 1 tablet (1 mg total) by mouth every 8 (eight) hours as needed for anxiety., Disp: 90 tablet, Rfl: 3   mirabegron ER (MYRBETRIQ) 25 MG TB24 tablet, Take 1 tablet (25 mg total) by mouth daily. (Patient taking differently: Take 25 mg by mouth daily. (0900)), Disp: 90 tablet, Rfl: 1   pantoprazole (PROTONIX) 40 MG tablet, Take 1 tablet (40 mg total) by mouth daily. (Patient taking differently: Take 40 mg by mouth in the morning. (0800)), Disp: 90 tablet, Rfl: 3   pioglitazone (ACTOS) 15 MG tablet, Take 1 tablet (15 mg total) by mouth daily. (0800), Disp: 90 tablet, Rfl: 1   prazosin (MINIPRESS) 2 MG capsule, Take 2 mg by mouth at bedtime. (2100), Disp: , Rfl:    Semaglutide (RYBELSUS) 3 MG TABS, Take 3 mg by mouth daily., Disp: 30 tablet, Rfl: 0   solifenacin (VESICARE) 5 MG tablet, Take 1 tablet (5 mg total) by mouth daily. (Patient taking differently: Take 5 mg by mouth in the morning. (0800)), Disp: 90 tablet, Rfl: 1   SUMAtriptan (IMITREX) 50 MG tablet, Take 1 tablet (50 mg total) by mouth every 2 (two) hours as needed for migraine. May repeat in 2 hours if headache persists or recurs., Disp: 10 tablet, Rfl: 2   Tiotropium Bromide Monohydrate (SPIRIVA RESPIMAT) 2.5 MCG/ACT AERS, Inhale 2 puffs into the lungs daily. (Patient taking differently: Inhale 2 puffs into the lungs daily. (0900)), Disp: 12 g, Rfl: 1   Objective:     Vitals:   04/29/21 1458  BP: 122/70  Pulse: 94  SpO2: 92%      There is no height or weight on file to calculate BMI.    Physical Exam:    General:  awake, alert oriented, no acute distress nontoxic Skin: no suspicious lesions or rashes Neuro:sensation intact, no deficits, strength 5/5 with no deficits, no atrophy, normal muscle tone Psych: No signs of anxiety,  depression or other mood disorder  Bilateral knee: No swelling No deformity Neg fluid wave, joint milking ROM right flexion 100, extension 20.  Left Flex 90, extension 30 NTTP over the quad tendon, medial fem condyle, lat fem condyle, patella, plica, patella tendon, tibial tuberostiy, fibular head, posterior fossa, pes anserine bursa, gerdy's tubercle, medial jt line, lateral jt line Neg sag sign Negative varus stress Negative valgus stress  Slowed gait using 4 pronged Rollator for ambulation   Bilateral shoulder: no deformity, swelling or muscle wasting No scapular winging TTP trapezius bilaterally NTTP over the Remer, clavicle, ac, coracoid, biceps groove, humerus, deltoid, subacromial space, scap spine, musculature, cervical spine Neg neer, hawkings, empty can, subscap liftoff, speeds, obriens Neg ant drawer, sulcus sign Neg apprehension Negative Spurling's test bilat Moderately reduced rotation and side bending of neck that is nonpainful through active ROM   Electronically signed by:  [name] D.[facility]  3:07 PM 04/29/21

## 2021-05-05 ENCOUNTER — Telehealth: Payer: Self-pay | Admitting: Sports Medicine

## 2021-05-05 NOTE — Telephone Encounter (Signed)
I called [name], the patient's POA to set up her follow up with Dr Glennon Mac (we were unable to schedule it when she left from her last visit). [facility] said that as of yesterday, the patient has been accepted into Hospice and they are unable to accommodate OT and other related therapy.  I told her to let us know if we can help with anything at all. Just FYI for Dr Glennon Mac

## 2021-05-12 ENCOUNTER — Telehealth: Payer: Self-pay | Admitting: Internal Medicine

## 2021-05-12 NOTE — Telephone Encounter (Signed)
Calling to notify patient had a fall

## 2021-05-15 ENCOUNTER — Encounter: Payer: Self-pay | Admitting: Internal Medicine

## 2021-05-19 DIAGNOSIS — N3946 Mixed incontinence: Secondary | ICD-10-CM | POA: Diagnosis not present

## 2021-05-19 DIAGNOSIS — N3281 Overactive bladder: Secondary | ICD-10-CM | POA: Diagnosis not present

## 2021-05-19 DIAGNOSIS — N3020 Other chronic cystitis without hematuria: Secondary | ICD-10-CM | POA: Diagnosis not present

## 2021-05-22 ENCOUNTER — Encounter: Payer: Self-pay | Admitting: Internal Medicine

## 2021-05-25 ENCOUNTER — Encounter: Payer: Self-pay | Admitting: Internal Medicine

## 2021-05-25 ENCOUNTER — Other Ambulatory Visit: Payer: Self-pay | Admitting: Internal Medicine

## 2021-06-18 ENCOUNTER — Encounter: Payer: Self-pay | Admitting: Internal Medicine

## 2021-07-01 ENCOUNTER — Encounter: Payer: Self-pay | Admitting: Internal Medicine

## 2021-07-07 ENCOUNTER — Telehealth: Payer: Self-pay | Admitting: Diagnostic Neuroimaging

## 2021-07-07 NOTE — Telephone Encounter (Signed)
[place] called stating when pt was last seen provider informed her if there was ever a letter needed for court Dr. Leta Baptist would write one up, well looks as if they are going to court to change over POA  and a letter is needed. Horris Latino is requesting a call back.

## 2021-08-26 ENCOUNTER — Other Ambulatory Visit: Payer: Self-pay

## 2021-08-26 ENCOUNTER — Encounter: Payer: Self-pay | Admitting: Diagnostic Neuroimaging

## 2021-08-26 ENCOUNTER — Ambulatory Visit: Admitting: Diagnostic Neuroimaging

## 2021-08-26 VITALS — BP 122/77 | HR 96 | Ht 66.0 in | Wt 192.6 lb

## 2021-08-26 DIAGNOSIS — R413 Other amnesia: Secondary | ICD-10-CM | POA: Diagnosis not present

## 2021-08-26 DIAGNOSIS — F411 Generalized anxiety disorder: Secondary | ICD-10-CM | POA: Diagnosis not present

## 2021-08-26 NOTE — Patient Instructions (Addendum)
°  COGNITIVE IMPAIRMENT (mainly due to anxiety / depression / insomnia) - safety / supervision issues reviewed - agree with HCPOA and POA support; may need a guardian appointed because she cannot name anyone else that she knows to take on this role; recommend to follow up with elder care attorney, psychiatry and palliative care team
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
## 2021-10-26 ENCOUNTER — Encounter: Payer: Self-pay | Admitting: Diagnostic Neuroimaging

## 2021-11-24 ENCOUNTER — Emergency Department (HOSPITAL_COMMUNITY)
Admission: EM | Admit: 2021-11-24 | Discharge: 2021-12-02 | Disposition: A | Discharge status: Home / Self Care | Attending: Emergency Medicine | Admitting: Emergency Medicine

## 2021-11-24 ENCOUNTER — Emergency Department (HOSPITAL_COMMUNITY)

## 2021-11-24 DIAGNOSIS — F32A Depression, unspecified: Secondary | ICD-10-CM | POA: Insufficient documentation

## 2021-11-24 DIAGNOSIS — B9689 Other specified bacterial agents as the cause of diseases classified elsewhere: Secondary | ICD-10-CM | POA: Insufficient documentation

## 2021-11-24 DIAGNOSIS — F29 Unspecified psychosis not due to a substance or known physiological condition: Secondary | ICD-10-CM | POA: Insufficient documentation

## 2021-11-24 DIAGNOSIS — F419 Anxiety disorder, unspecified: Secondary | ICD-10-CM | POA: Diagnosis not present

## 2021-11-24 DIAGNOSIS — R45851 Suicidal ideations: Secondary | ICD-10-CM | POA: Diagnosis not present

## 2021-11-24 DIAGNOSIS — I10 Essential (primary) hypertension: Secondary | ICD-10-CM | POA: Diagnosis not present

## 2021-11-24 DIAGNOSIS — F0390 Unspecified dementia without behavioral disturbance: Secondary | ICD-10-CM | POA: Insufficient documentation

## 2021-11-24 DIAGNOSIS — M25562 Pain in left knee: Secondary | ICD-10-CM | POA: Diagnosis not present

## 2021-11-24 DIAGNOSIS — N390 Urinary tract infection, site not specified: Secondary | ICD-10-CM | POA: Diagnosis not present

## 2021-11-24 DIAGNOSIS — X838XXA Intentional self-harm by other specified means, initial encounter: Secondary | ICD-10-CM | POA: Insufficient documentation

## 2021-11-24 LAB — CBC WITH DIFFERENTIAL/PLATELET
Abs Immature Granulocytes: 0.02 10*3/uL (ref 0.00–0.07)
Basophils Absolute: 0 10*3/uL (ref 0.0–0.1)
Basophils Relative: 1 %
Eosinophils Absolute: 0.2 10*3/uL (ref 0.0–0.5)
Eosinophils Relative: 3 %
HCT: 41.5 % (ref 36.0–46.0)
Hemoglobin: 13.9 g/dL (ref 12.0–15.0)
Immature Granulocytes: 0 %
Lymphocytes Relative: 20 %
Lymphs Abs: 1.1 10*3/uL (ref 0.7–4.0)
MCH: 31.1 pg (ref 26.0–34.0)
MCHC: 33.5 g/dL (ref 30.0–36.0)
MCV: 92.8 fL (ref 80.0–100.0)
Monocytes Absolute: 0.5 10*3/uL (ref 0.1–1.0)
Monocytes Relative: 8 %
Neutro Abs: 3.9 10*3/uL (ref 1.7–7.7)
Neutrophils Relative %: 68 %
Platelets: 187 10*3/uL (ref 150–400)
RBC: 4.47 MIL/uL (ref 3.87–5.11)
RDW: 12.2 % (ref 11.5–15.5)
WBC: 5.7 10*3/uL (ref 4.0–10.5)
nRBC: 0 % (ref 0.0–0.2)

## 2021-11-24 LAB — URINALYSIS, ROUTINE W REFLEX MICROSCOPIC
Bilirubin Urine: NEGATIVE
Glucose, UA: NEGATIVE mg/dL
Hgb urine dipstick: NEGATIVE
Ketones, ur: NEGATIVE mg/dL
Nitrite: POSITIVE — AB
Protein, ur: NEGATIVE mg/dL
Specific Gravity, Urine: 1.014 (ref 1.005–1.030)
WBC, UA: >50 WBC/hpf — ABNORMAL HIGH (ref 0–5)
pH: 6 (ref 5.0–8.0)

## 2021-11-24 LAB — COMPREHENSIVE METABOLIC PANEL
ALT: 12 U/L (ref 0–44)
AST: 16 U/L (ref 15–41)
Albumin: 3.6 g/dL (ref 3.5–5.0)
Alkaline Phosphatase: 106 U/L (ref 38–126)
Anion gap: 7 (ref 5–15)
BUN: 15 mg/dL (ref 8–23)
CO2: 27 mmol/L (ref 22–32)
Calcium: 9.1 mg/dL (ref 8.9–10.3)
Chloride: 105 mmol/L (ref 98–111)
Creatinine, Ser: 0.72 mg/dL (ref 0.44–1.00)
GFR, Estimated: >60 mL/min (ref >60)
Glucose, Bld: 150 mg/dL — ABNORMAL HIGH (ref 70–99)
Potassium: 4 mmol/L (ref 3.5–5.1)
Sodium: 139 mmol/L (ref 135–145)
Total Bilirubin: 0.6 mg/dL (ref 0.3–1.2)
Total Protein: 7.1 g/dL (ref 6.5–8.1)

## 2021-11-24 LAB — ACETAMINOPHEN LEVEL: Acetaminophen (Tylenol), Serum: <10 ug/mL — ABNORMAL LOW (ref 10–30)

## 2021-11-24 LAB — RAPID URINE DRUG SCREEN, HOSP PERFORMED
Amphetamines: NOT DETECTED
Barbiturates: NOT DETECTED
Benzodiazepines: POSITIVE — AB
Cocaine: NOT DETECTED
Opiates: NOT DETECTED
Tetrahydrocannabinol: NOT DETECTED

## 2021-11-24 LAB — SALICYLATE LEVEL: Salicylate Lvl: <7 mg/dL — ABNORMAL LOW (ref 7.0–30.0)

## 2021-11-24 LAB — ETHANOL: Alcohol, Ethyl (B): <10 mg/dL (ref <10)

## 2021-11-24 MED ORDER — ARIPIPRAZOLE 10 MG PO TABS
10.0000 mg | ORAL_TABLET | Freq: Every day | ORAL | Status: DC
  Administered 2021-11-24 – 2021-11-25 (×2): 10 mg via ORAL
  Filled 2021-11-24 (×2): qty 1

## 2021-11-24 MED ORDER — AMLODIPINE BESYLATE 5 MG PO TABS
2.5000 mg | ORAL_TABLET | Freq: Every morning | ORAL | Status: DC
Start: 2021-11-25 — End: 2021-12-02
  Administered 2021-11-25 – 2021-12-02 (×8): 2.5 mg via ORAL
  Filled 2021-11-24 (×8): qty 1

## 2021-11-24 MED ORDER — SULFAMETHOXAZOLE-TRIMETHOPRIM 800-160 MG PO TABS
1.0000 | ORAL_TABLET | Freq: Two times a day (BID) | ORAL | Status: AC
  Administered 2021-11-24 – 2021-11-29 (×10): 1 via ORAL
  Filled 2021-11-24 (×10): qty 1

## 2021-11-24 MED ORDER — LINACLOTIDE 145 MCG PO CAPS
145.0000 ug | ORAL_CAPSULE | Freq: Every day | ORAL | Status: DC
  Administered 2021-11-26 – 2021-12-02 (×7): 145 ug via ORAL
  Filled 2021-11-24 (×10): qty 1

## 2021-11-24 MED ORDER — PRAZOSIN HCL 1 MG PO CAPS
2.0000 mg | ORAL_CAPSULE | Freq: Every day | ORAL | Status: DC
  Administered 2021-11-24 – 2021-12-01 (×8): 2 mg via ORAL
  Filled 2021-11-24 (×8): qty 2

## 2021-11-24 NOTE — ED Notes (Signed)
Pt also complaining of L knee pain, reports increased falls. Edema present to L knee.  ?
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
## 2021-11-24 NOTE — Consult Note (Addendum)
[facility] Face-to-Face Psychiatry Consult  ? ?Reason for Consult:  Psychiatry evaluation ?Referring Physician:  ER Physician ?Patient Identification: [name] ?MRN:  [id number] ?Principal Diagnosis: Suicidal ideation ?Diagnosis:  Principal Problem: ?  Suicidal ideation ?Active Problems: ?  Anxiety ? ? ?Total Time spent with patient: 45 minutes ? ?Subjective:   ?[name] is a 76 y.o. female patient admitted with hx of Depression, anxiety and Dementia brought in by .EMS under IVC taken out by her POA  for suicide ideation/gesture wanted to cut her wrist or drink Antifreeze. ? ?HPI:  Patient was seen this evening awake, alert and oriented x4.  She answered questions correctly and she engaged fully well during our interaction.  Patient is hard of hearing and wearing bilateral  hearing aid.  Patient admitted that she had a knife, sharp one to cut her wrist or drink Antifreeze because she was told she could not take her cat with her to a new place she is going to.  Patient lives in a facility but now seems to be moving to a hospice care facility.  She was told her cat will not join her.  She became upset and stated that life has no meaning for her anymore.  She reported that at the time she felt life without the cat was the end for her.  Now she she said that she has had time to think about it and that she is willing to have the cat come visit her once a week or go visit the cat.  She now denied Suicide ideation or thought.  She vehemently denied any thought of wanting to harm herself. ?Provider called Adria Devon POA and she stated that patient was informed that cat is not going with her and that she can come and visit the cat some times.  Cat is to be left with her current next door neighbor who is her friend but she wanted the cat to come with her.  However she will like to be contacted in the morning when decision is made about patient. ?Patient will be reassessed in the morning to determine appropriate  disposition. ? ?Past Psychiatric History: Depression, anxiety, hx Dementia ? ?Risk to Self:   ?Risk to Others:   ?Prior Inpatient Therapy:   ?Prior Outpatient Therapy:   ? ?Past Medical History:  ?Past Medical History:  ?Diagnosis Date  ? Acid reflux disease   ? Acute kidney injury ([place])   ? Anxiety   ? Chronic bronchitis ([place])   ? Depression   ? Diabetes mellitus without complication ([place])   ? Dizziness   ? Falls   ? High cholesterol   ? Hydrocephalus ([place])   ? Hypertension   ? Hypotension   ? MVP (mitral valve prolapse)   ? Sinus bradycardia   ? Transaminitis   ?  ?Past Surgical History:  ?Procedure Laterality Date  ? BRAIN SURGERY    ? BREAST BIOPSY Left   ? CHOLECYSTECTOMY    ? COLONOSCOPY WITH PROPOFOL N/A 10/24/2020  ? Procedure: COLONOSCOPY WITH PROPOFOL;  Surgeon: Carol Ada, MD;  Location: [facility] ENDOSCOPY;  Service: Endoscopy;  Laterality: N/A;  ? ERCP N/A 02/28/2014  ? Procedure: ENDOSCOPIC RETROGRADE CHOLANGIOPANCREATOGRAPHY (ERCP);  Surgeon: Gatha Mayer, MD;  Location: [facility] ENDOSCOPY;  Service: Endoscopy;  Laterality: N/A;  talked to tiffany from or /ebp  ? ESOPHAGOGASTRODUODENOSCOPY N/A 02/28/2014  ? Procedure: ESOPHAGOGASTRODUODENOSCOPY (EGD);  Surgeon: Gatha Mayer, MD;  Location: [facility] ENDOSCOPY;  Service: Endoscopy;  Laterality: N/A;  ?  POLYPECTOMY  10/24/2020  ? Procedure: POLYPECTOMY;  Surgeon: Carol Ada, MD;  Location: [facility] ENDOSCOPY;  Service: Endoscopy;;  ? VENTRICULOPERITONEAL SHUNT    ? right occipital  ? ?Family History:  ?Family History  ?Problem Relation Age of Onset  ? Heart disease Mother   ? Stroke Mother   ? Hypertension Mother   ? Breast cancer Mother   ? Alcohol abuse Father   ? Diabetes Father   ? Breast cancer Maternal Aunt   ? Breast cancer Maternal Grandmother   ? Cancer Neg Hx   ? ?Family Psychiatric  History: unknown ?Social History:  ?Social History  ? ?Substance and Sexual Activity  ?Alcohol Use No  ?   ?Social History  ? ?Substance and Sexual Activity  ?Drug Use No  ?  ?Social  History  ? ?Socioeconomic History  ? Marital status: Divorced  ?  Spouse name: Not on file  ? Number of children: 0  ? Years of education: 52  ? Highest education level: Not on file  ?Occupational History  ? Not on file  ?Tobacco Use  ? Smoking status: Never  ? Smokeless tobacco: Never  ?Vaping Use  ? Vaping Use: Never used  ?Substance and Sexual Activity  ? Alcohol use: No  ? Drug use: No  ? Sexual activity: Not Currently  ?Other Topics Concern  ? Not on file  ?Social History Narrative  ? 08/26/21 Lives at [facility]  Lives with [place].    ? Education HS.    ? Children none.    ? Divorced.  Retired.  ? ?Social Determinants of Health  ? ?Financial Resource Strain: Low Risk   ? Difficulty of Paying Living Expenses: Not hard at all  ?Food Insecurity: No Food Insecurity  ? Worried About Charity fundraiser in the Last Year: Never true  ? Ran Out of Food in the Last Year: Never true  ?Transportation Needs: No Transportation Needs  ? Lack of Transportation (Medical): No  ? Lack of Transportation (Non-Medical): No  ?Physical Activity: Inactive  ? Days of Exercise per Week: 0 days  ? Minutes of Exercise per Session: 0 min  ?Stress: No Stress Concern Present  ? Feeling of Stress : Not at all  ?Social Connections: Socially Isolated  ? Frequency of Communication with Friends and Family: More than three times a week  ? Frequency of Social Gatherings with Friends and Family: Three times a week  ? Attends Religious Services: Never  ? Active Member of Clubs or Organizations: No  ? Attends Archivist Meetings: Never  ? Marital Status: Divorced  ? ?Additional Social History: ?  ? ?Allergies:   ?Allergies  ?Allergen Reactions  ? Metformin And Related Diarrhea  ?  (takes metformin at home w/ meals)  ? Ampicillin Hives  ?  Did it involve swelling of the face/tongue/throat, SOB, or low BP? No ?Did it involve sudden or severe rash/hives, skin peeling, or any reaction on the inside of your mouth or nose? Yes ?Did  you need to seek medical attention at a hospital or doctor's office? No ?When did it last happen? Over 10 years ago ?If all above answers are ?NO?, may proceed with cephalosporin use.  ? Onion   ? Ace Inhibitors Other (See Comments)  ?  Unknown rxn per pt  ? Erythromycin Hives  ? Penicillins Hives  ?  Has patient had a PCN reaction causing immediate rash, facial/tongue/throat swelling, SOB or lightheadedness with hypotension: Yes ?Has  patient had a PCN reaction causing severe rash involving mucus membranes or skin necrosis: No ?Has patient had a PCN reaction that required hospitalization: No ?Has patient had a PCN reaction occurring within the last 10 years: No ?If all of the above answers are "NO", then may proceed with Cephalosporin use. ?  ? Topamax [Topiramate] Rash  ? ? ?Labs:  ?Results for orders placed or performed during the hospital encounter of 11/24/21 (from the past 48 hour(s))  ?CBC with Differential     Status: None  ? Collection Time: 11/24/21  5:42 PM  ?Result Value Ref Range  ? WBC 5.7 4.0 - 10.5 K/uL  ? RBC 4.47 3.87 - 5.11 MIL/uL  ? Hemoglobin 13.9 12.0 - 15.0 g/dL  ? HCT 41.5 36.0 - 46.0 %  ? MCV 92.8 80.0 - 100.0 fL  ? MCH 31.1 26.0 - 34.0 pg  ? MCHC 33.5 30.0 - 36.0 g/dL  ? RDW 12.2 11.5 - 15.5 %  ? Platelets 187 150 - 400 K/uL  ? nRBC 0.0 0.0 - 0.2 %  ? Neutrophils Relative % 68 %  ? Neutro Abs 3.9 1.7 - 7.7 K/uL  ? Lymphocytes Relative 20 %  ? Lymphs Abs 1.1 0.7 - 4.0 K/uL  ? Monocytes Relative 8 %  ? Monocytes Absolute 0.5 0.1 - 1.0 K/uL  ? Eosinophils Relative 3 %  ? Eosinophils Absolute 0.2 0.0 - 0.5 K/uL  ? Basophils Relative 1 %  ? Basophils Absolute 0.0 0.0 - 0.1 K/uL  ? Immature Granulocytes 0 %  ? Abs Immature Granulocytes 0.02 0.00 - 0.07 K/uL  ?  Comment: Performed at [facility], [place] [address] [place], [place] [address]  ?Comprehensive metabolic panel     Status: Abnormal  ? Collection Time: 11/24/21  5:42 PM  ?Result Value Ref Range  ? Sodium 139 135 - 145  mmol/L  ? Potassium 4.0 3.5 - 5.1 mmol/L  ? Chloride 105 98 - 111 mmol/L  ? CO2 27 22 - 32 mmol/L  ? Glucose, Bld 150 (H) 70 - 99 mg/dL  ?  Comment: Glucose reference range applies only to samples tak

## 2021-11-24 NOTE — ED Notes (Signed)
Call Hospice [phone] when patient is released ?Per Lorelle Formosa, RN ?

## 2021-11-24 NOTE — ED Provider Notes (Signed)
?[facility] DEPT ?Provider Note ? ? ?CSN: [id number] ?Arrival date & time: 11/24/21  1609 ? ?  ? ?History ? ?Chief Complaint  ?Patient presents with  ? Suicidal  ? ? ?[name] is a 76 y.o. female hx of depression and anxiety here presenting with suicidal ideation.  Patient has a history of dementia.  Patient is from [facility] assisted living.  Patient apparently needs to be transferred to a higher level of care and is no longer allowed to bring her pet cat Maggie.  She states that she got very upset and tried to cut her wrist in front of the healthcare power of attorney.  She was stopped immediately and she also threatened to drink antifreeze.  Patient was IVC by power of attorney.  ? ?The history is provided by the patient.  ? ?  ? ?Home Medications ?Prior to Admission medications   ?Medication Sig Start Date End Date Taking? Authorizing Provider  ?acetaminophen (TYLENOL) 325 MG tablet Take 650 mg by mouth 2 (two) times daily as needed (pain). ?Patient not taking: Reported on 08/26/2021    [provider]  ?amLODipine (NORVASC) 2.5 MG tablet Take 2.5 mg by mouth in the morning. (0900)    [provider]  ?ARIPiprazole (ABILIFY) 10 MG tablet Take 10 mg by mouth daily. Dr. Toy Care; need correct dosage    [provider]  ?atorvastatin (LIPITOR) 40 MG tablet Take 1 tablet (40 mg total) by mouth daily. ?Patient taking differently: Take 40 mg by mouth every evening. (2100) 01/10/20   Janith Lima, MD  ?bisacodyl (DULCOLAX) 10 MG suppository Place 10 mg rectally daily as needed for moderate constipation.    [provider]  ?desvenlafaxine (PRISTIQ) 100 MG 24 hr tablet Take 100 mg by mouth every evening. (2100)    [provider]  ?levocetirizine (XYZAL) 5 MG tablet Take 2 tablets (10 mg total) by mouth every evening. 10/27/20   Janith Lima, MD  ?lidocaine (LIDODERM) 5 % Place 1 patch onto the skin daily. Remove & Discard patch within 12  hours or as directed by MD ?Patient taking differently: Place 1 patch onto the skin every 12 (twelve) hours as needed (pain). Remove & Discard patch within 12 hours or as directed by MD 04/16/19   Janith Lima, MD  ?linaclotide Rolan Lipa) 145 MCG CAPS capsule Take 1 capsule (145 mcg total) by mouth daily before breakfast. 11/03/20   Janith Lima, MD  ?LORazepam (ATIVAN) 1 MG tablet Take 1 tablet (1 mg total) by mouth every 8 (eight) hours as needed for anxiety. 04/21/21   Janith Lima, MD  ?mirabegron ER (MYRBETRIQ) 25 MG TB24 tablet Take 1 tablet (25 mg total) by mouth daily. ?Patient taking differently: Take 25 mg by mouth daily. (0900) 04/24/19   Janith Lima, MD  ?pantoprazole (PROTONIX) 40 MG tablet Take 1 tablet (40 mg total) by mouth daily. ?Patient taking differently: Take 40 mg by mouth in the morning. (0800) 10/26/17   Janith Lima, MD  ?pioglitazone (ACTOS) 15 MG tablet Take 1 tablet (15 mg total) by mouth daily. (0800) 04/22/21   Janith Lima, MD  ?prazosin (MINIPRESS) 2 MG capsule Take 2 mg by mouth at bedtime. (2100)    [provider]  ?Semaglutide (RYBELSUS) 3 MG TABS Take 3 mg by mouth daily. 04/28/21   Janith Lima, MD  ?solifenacin (VESICARE) 5 MG tablet Take 1 tablet (5 mg total) by mouth daily. ?Patient not  taking: Reported on 08/26/2021 02/27/19   Janith Lima, MD  ?SUMAtriptan (IMITREX) 50 MG tablet Take 1 tablet (50 mg total) by mouth every 2 (two) hours as needed for migraine. May repeat in 2 hours if headache persists or recurs. 12/06/18   Janith Lima, MD  ?Tiotropium Bromide Monohydrate (SPIRIVA RESPIMAT) 2.5 MCG/ACT AERS Inhale 2 puffs into the lungs daily. ?Patient taking differently: Inhale 2 puffs into the lungs daily. (0900) 09/05/20   Janith Lima, MD  ?   ? ?Allergies    ?Metformin and related, Ampicillin, Onion, Ace inhibitors, Erythromycin, Penicillins, and Topamax [topiramate]   ? ?Review of Systems   ?Review of Systems  ?Psychiatric/Behavioral:  Positive  for self-injury and suicidal ideas.   ?All other systems reviewed and are negative. ? ?Physical Exam ?Updated Vital Signs ?BP 112/89 (BP Location: Left Arm)   Pulse 92   Temp 98.9 ?F (37.2 ?C) (Oral)   Resp 20   Ht '5\' 5"'$  (1.651 m)   Wt 87.1 kg Comment: per record  SpO2 98%   BMI 31.95 kg/m?  ?Physical Exam ?Vitals and nursing note reviewed.  ?Constitutional:   ?   Comments: Depressed, suicide  ?HENT:  ?   Head: Normocephalic.  ?   Nose: Nose normal.  ?   Mouth/Throat:  ?   Mouth: Mucous membranes are moist.  ?Eyes:  ?   Extraocular Movements: Extraocular movements intact.  ?   Pupils: Pupils are equal, round, and reactive to light.  ?Cardiovascular:  ?   Rate and Rhythm: Normal rate and regular rhythm.  ?   Pulses: Normal pulses.  ?   Heart sounds: Normal heart sounds.  ?Pulmonary:  ?   Effort: Pulmonary effort is normal.  ?   Breath sounds: Normal breath sounds.  ?Abdominal:  ?   General: Abdomen is flat.  ?   Palpations: Abdomen is soft.  ?Musculoskeletal:     ?   General: Normal range of motion.  ?   Cervical back: Normal range of motion and neck supple.  ?Skin: ?   General: Skin is warm.  ?   Capillary Refill: Capillary refill takes less than 2 seconds.  ?Neurological:  ?   General: No focal deficit present.  ?   Mental Status: She is oriented to person, place, and time.  ?Psychiatric:  ?   Comments: Depressed, poor judgment  ? ? ?ED Results / Procedures / Treatments   ?Labs ?(all labs ordered are listed, but only abnormal results are displayed) ?Labs Reviewed  ?CBC WITH DIFFERENTIAL/PLATELET  ?COMPREHENSIVE METABOLIC PANEL  ?ETHANOL  ?SALICYLATE LEVEL  ?ACETAMINOPHEN LEVEL  ?RAPID URINE DRUG SCREEN, HOSP PERFORMED  ?URINALYSIS, ROUTINE W REFLEX MICROSCOPIC  ? ? ?EKG ?None ? ?Radiology ?No results found. ? ?Procedures ?Procedures  ? ? ?Medications Ordered in ED ?Medications - No data to display ? ?ED Course/ Medical Decision Making/ A&P ?  ?                        ?Medical Decision Making ?[name] is a 76 y.o. female here presenting with suicidal ideation.  Patient tried to cut her wrist and thought about drinking antifreeze.  IVC by facility and I filled out the first exam.  We will get psych clearance labs.  Patient also has left knee pain and that is a chronic issue so we will get an x-ray to make sure she has no new fracture. ? ?7:15 PM ?Labs  are unremarkable.  X-ray showed arthritis.  UA showed UTI.  Per the healthcare proxy, patient is incontinent at baseline and gets recurrent UTIs.  Her recent urine culture is sensitive to Bactrim but resistant to Keflex.  We will start on 5 days of Bactrim.  Patient is medically clear for psych eval currently.  Anticipate that she may need Geri psych placement ? ?Problems Addressed: ?Suicidal ideation: acute illness or injury ?Urinary tract infection without hematuria, site unspecified: acute illness or injury ? ?Amount and/or Complexity of Data Reviewed ?Labs: ordered. Decision-making details documented in ED Course. ?Radiology: ordered and independent interpretation performed. Decision-making details documented in ED Course. ? ?Risk ?Prescription drug management. ? ?Final Clinical Impression(s) / ED Diagnoses ?Final diagnoses:  ?None  ? ? ?Rx / DC Orders ?ED Discharge Orders   ? ? None  ? ?  ? ? ?  ?Drenda Freeze, MD ?11/24/21 1917 ? ?

## 2021-11-25 DIAGNOSIS — R45851 Suicidal ideations: Secondary | ICD-10-CM

## 2021-11-25 MED ORDER — ACETAMINOPHEN 500 MG PO TABS
1000.0000 mg | ORAL_TABLET | Freq: Four times a day (QID) | ORAL | Status: DC | PRN
Start: 2021-11-25 — End: 2021-12-02
  Administered 2021-11-25 – 2021-12-02 (×13): 1000 mg via ORAL
  Filled 2021-11-25 (×14): qty 2

## 2021-11-25 MED ORDER — LIDOCAINE 5 % EX PTCH
1.0000 | MEDICATED_PATCH | CUTANEOUS | Status: DC
  Administered 2021-11-25 – 2021-12-01 (×7): 1 via TRANSDERMAL
  Filled 2021-11-25 (×14): qty 1

## 2021-11-25 MED ORDER — ARIPIPRAZOLE 2 MG PO TABS
2.0000 mg | ORAL_TABLET | Freq: Every day | ORAL | Status: DC
  Administered 2021-11-26 – 2021-12-02 (×7): 2 mg via ORAL
  Filled 2021-11-25 (×7): qty 1

## 2021-11-25 MED ORDER — SULFAMETHOXAZOLE-TRIMETHOPRIM 800-160 MG PO TABS: 1.0000 | ORAL_TABLET | Freq: Two times a day (BID) | ORAL | 0 refills | Status: AC

## 2021-11-25 MED ORDER — ONDANSETRON 4 MG PO TBDP
4.0000 mg | ORAL_TABLET | Freq: Once | ORAL | Status: AC
  Administered 2021-11-25: 4 mg via ORAL
  Filled 2021-11-25: qty 1

## 2021-11-25 NOTE — Progress Notes (Signed)
CSW TOC spoke with pt's HCPOA [name] , she stated pt is not able to return to [facility] as pt is needing a Higher level of care. [name] stated "I'm packing her things as we speak" [name] stated pt had a bed at [facility] today but since pt's has been in [facility] ED, the bed offer was rescinded. CSW  attempted to contact [name] from [facility] to get more information no response left VM requesting a return call.  ? ?2:14 pm ?CSW spoke with [name] from [facility], she reported bed offer was rescinded due to pt's change in condition. [name] stated the facility is unable to provide 24 hr sitter services for pt. ? ?CSW spoke with [name] to inform her of their decision. MS Cark stated hospice sent another referral out to [facility] a week ago. CSW contact [name] from [facility] who reported not having available LTC beds for females. ? ?CSW will fax information out for placement. ?[name].[name], MSW, [place] ?[facility]  Transitions of Care ?Clinical Social Worker I ?Direct Dial: [phone]  Fax: [phone] ?[email or web address]  ? ? ? ? ? ? ?

## 2021-11-25 NOTE — Progress Notes (Signed)
Manufacturing engineer [facility]) Hospital Liasion Note ? ?This is a current hospice patient with [facility]. We will follow for discharge planning and continuation of hospice services at discharge. Please call with any questions or concerns. Thank you ? ? ?Roselee Nova, LCSW ?[facility] Liaison  ?[phone] ?

## 2021-11-25 NOTE — BH Assessment (Signed)
[facility] Assessment Progress Note ?  ?Per Garrison Columbus, NP, this pt does not require psychiatric hospitalization at this time.  Pt presents under IVC; EDP Sherwood Gambler, MD has been asked to rescind it..  Pt is psychiatrically cleared.  Discharge instructions advise pt to follow up with her regular outpatient providers, but also include referral information for area psychiatrists and therapists.  Dr Regenia Skeeter and pt's nurse, Andee Poles, have been notified. ? ?Jalene Mullet, MA ?Triage Specialist ?[phone]  ?

## 2021-11-25 NOTE — ED Notes (Signed)
Pt assisted to walk to the bathroom. ?

## 2021-11-25 NOTE — ED Notes (Signed)
Pt brief changed.  

## 2021-11-25 NOTE — Discharge Instructions (Addendum)
For your behavioral health needs you are advised to follow up with your regular outpatient psychiatrist and therapist.  If you do not have providers of these services at this time, contact the providers listed below to schedule intake appointments: ? ?PSYCHIATRY: ? ?     Norma Fredrickson, MD ?     [facility] [place] ?     [address], [address] ?     [place], [place] [address] ?     [phone] ?     This provider offers both psychiatry and therapy ? ?     Leanord Hawking, MD ?     [facility] ?     [address] ?     [place], [place] [address] ?     [phone]   ? ?THERAPY: ? ?     [facility] ?     [address] ?     [place], [place] [address] ?     [phone]  ? ?     [facility] ?     [address] ?     [place], [place] [address] ?     [phone]  ?

## 2021-11-25 NOTE — ED Provider Notes (Signed)
Emergency Medicine Observation Re-evaluation Note ? ?[name] is a 76 y.o. female, seen on rounds today.  Pt initially presented to the ED for complaints of Suicidal ?Currently, the patient is is resting.  She denies any acute suicidal complaints. ? ?Physical Exam  ?BP 137/83 (BP Location: Right Arm)   Pulse 97   Temp 98.8 ?F (37.1 ?C) (Oral)   Resp 18   Ht '5\' 5"'$  (1.651 m)   Wt 87.1 kg Comment: per record  SpO2 98%   BMI 31.95 kg/m?  ?Physical Exam ?General: Resting in bed, no acute distress ?Lungs: Normal effort ?Psych: Denies suicidal thoughts ? ?ED Course / MDM  ?EKG:  ? ?I have reviewed the labs performed to date as well as medications administered while in observation.  Recent changes in the last 24 hours include home meds and bactrim being ordered. ? ?Plan  ?Current plan is for discharge.  She was cleared by psychiatry.  She does have a urinary tract infection and so she will be prescribed Bactrim as per previous provider recommendations and urine culture review.  No obvious drug drug interactions based on her home medicine list. ? TAKYA VANDIVIER is not under involuntary commitment. ? ? ?  ?Sherwood Gambler, MD ?11/25/21 1139 ? ?

## 2021-11-25 NOTE — ED Notes (Signed)
Pt provided with meal tray.

## 2021-11-25 NOTE — Consult Note (Signed)
Telepsych Consultation  ? ?Reason for Consult:  Suicidal Ideation ?Referring Physician:  Dr. Regenia Skeeter ?Location of Patient: [facility] ?Location of Provider: [place] Department ? ?Patient Identification: ABBRIELLE Wolf ?MRN:  [id number] ?Principal Diagnosis: Suicidal ideation ?Diagnosis:  Principal Problem: ?  Suicidal ideation ?Active Problems: ?  Anxiety ? ?Total Time spent with patient: 1 hour ? ?Subjective:   ?[name] is a 76 y.o. female patient initially presented to the [facility] for complaints of Suicidal ideation. Currently, She denies any acute suicidal complaints. ?  ?HPI:  [name] is a 76 y.o. female with psychiatric hx of depression and anxiety presented to [facility] with suicidal ideation.  Patient also has a history of dementia.  Patient is from [facility] assisted [facility].  Patient apparently needs to be transferred to a higher level of care [facility]) and is no longer allowed to bring her pet [place].  She states that she got very upset and tried to cut her wrist in front of the healthcare power of attorney (HCPOA).  She was stopped immediately and she also threatened to drink antifreeze.  Patient was IVC by power of attorney.   ? ?Patient reported she missed her cat so much and someone else now have the Cat. Endorsed above suicidal ideation because she is not allowed to take the cat to hospice facility. Denied SI/HI/AVH, any suicidal attempt in the past, any access to firearm, hx of drugs, alcohol or tobacco use/dependence or self injurious behavior. Medication management by Dr. Toy Care. Denies family history of mental health illness. ? ?On assessment today via Telepsych, patient is sitting on her very agitated and concerned about her cat. Alert and oriented to name, time, place and situation. Mood /Affect  angry, anxious and depressed. Thought process is coherent and linear and thought content is logical and within normal limit. ? ?Disposition: Based on my evaluation, patient  has no evidence of imminent risk to self or others at present and does not meet criteria for psychiatric inpatient admission. She is then psych cleared and can be discharge when medically stable. Supportive therapy provided about ongoing stressors. Discussed crisis plan, support from social network, calling 911, coming to the Emergency Department, and calling Suicide Hotline. [place] staff and [facility] mare aware of patient disposition. ?  ? ?Past Psychiatric History: Depression, Anxiety, Suicidal Ideation ? ?Risk to Self:  NO ?Risk to Others:  NO ?Prior Inpatient Therapy:  No ?Prior Outpatient Therapy: Yes  ? ?Past Medical History:  ?Past Medical History:  ?Diagnosis Date  ? Acid reflux disease   ? Acute kidney injury ([place])   ? Anxiety   ? Chronic bronchitis ([place])   ? Depression   ? Diabetes mellitus without complication ([place])   ? Dizziness   ? Falls   ? High cholesterol   ? Hydrocephalus ([place])   ? Hypertension   ? Hypotension   ? MVP (mitral valve prolapse)   ? Sinus bradycardia   ? Transaminitis   ?  ?Past Surgical History:  ?Procedure Laterality Date  ? BRAIN SURGERY    ? BREAST BIOPSY Left   ? CHOLECYSTECTOMY    ? COLONOSCOPY WITH PROPOFOL N/A 10/24/2020  ? Procedure: COLONOSCOPY WITH PROPOFOL;  Surgeon: Carol Ada, MD;  Location: [facility];  Service: Endoscopy;  Laterality: N/A;  ? ERCP N/A 02/28/2014  ? Procedure: ENDOSCOPIC RETROGRADE CHOLANGIOPANCREATOGRAPHY (ERCP);  Surgeon: Gatha Mayer, MD;  Location: [facility] ENDOSCOPY;  Service: Endoscopy;  Laterality: N/A;  talked to tiffany from or /ebp  ?  ESOPHAGOGASTRODUODENOSCOPY N/A 02/28/2014  ? Procedure: ESOPHAGOGASTRODUODENOSCOPY (EGD);  Surgeon: Gatha Mayer, MD;  Location: [facility] ENDOSCOPY;  Service: Endoscopy;  Laterality: N/A;  ? POLYPECTOMY  10/24/2020  ? Procedure: POLYPECTOMY;  Surgeon: Carol Ada, MD;  Location: [facility] ENDOS[facility];  Service: Endoscopy;;  ? VENTRICULOPERITONEAL SHUNT    ? right occipital  ? ?Family History:  ?Family History  ?Problem  Relation Age of Onset  ? Heart disease Mother   ? Stroke Mother   ? Hypertension Mother   ? Breast cancer Mother   ? Alcohol abuse Father   ? Diabetes Father   ? Breast cancer Maternal Aunt   ? Breast cancer Maternal Grandmother   ? Cancer Neg Hx   ? ?Family Psychiatric  History: non reported ?Social History:  ?Social History  ? ?Substance and Sexual Activity  ?Alcohol Use No  ?   ?Social History  ? ?Substance and Sexual Activity  ?Drug Use No  ?  ?Social History  ? ?Socioeconomic History  ? Marital status: Divorced  ?  Spouse name: Not on file  ? Number of children: 0  ? Years of education: 47  ? Highest education level: Not on file  ?Occupational History  ? Not on file  ?Tobacco Use  ? Smoking status: Never  ? Smokeless tobacco: Never  ?Vaping Use  ? Vaping Use: Never used  ?Substance and Sexual Activity  ? Alcohol use: No  ? Drug use: No  ? Sexual activity: Not Currently  ?Other Topics Concern  ? Not on file  ?Social History Narrative  ? 08/26/21 Lives at [place]  Lives with [place].    ? Education HS.    ? Children none.    ? Divorced.  Retired.  ? ?Social Determinants of Health  ? ?Financial Resource Strain: Low Risk   ? Difficulty of Paying Living Expenses: Not hard at all  ?Food Insecurity: No Food Insecurity  ? Worried About Charity fundraiser in the Last Year: Never true  ? Ran Out of Food in the Last Year: Never true  ?Transportation Needs: No Transportation Needs  ? Lack of Transportation (Medical): No  ? Lack of Transportation (Non-Medical): No  ?Physical Activity: Inactive  ? Days of Exercise per Week: 0 days  ? Minutes of Exercise per Session: 0 min  ?Stress: No Stress Concern Present  ? Feeling of Stress : Not at all  ?Social Connections: Socially Isolated  ? Frequency of Communication with Friends and Family: More than three times a week  ? Frequency of Social Gatherings with Friends and Family: Three times a week  ? Attends Religious Services: Never  ? Active Member of Clubs or  Organizations: No  ? Attends Archivist Meetings: Never  ? Marital Status: Divorced  ? ?Additional Social History: ?  ?Allergies:   ?Allergies  ?Allergen Reactions  ? Metformin And Related Diarrhea  ?  (takes metformin at home w/ meals) ??  ? Ampicillin Hives  ?  Did it involve swelling of the face/tongue/throat, SOB, or low BP? No ?Did it involve sudden or severe rash/hives, skin peeling, or any reaction on the inside of your mouth or nose? Yes ?Did you need to seek medical attention at a hospital or doctor's office? No ?When did it last happen? Over 10 years ago ?If all above answers are "NO", may proceed with cephalosporin use.  ? Onion Other (See Comments)  ?  "Allergic," per MAR  ? Ace Inhibitors Other (See Comments)  ?  "  Allergic," per [facility]  ? Erythromycin Hives  ? Penicillins Hives  ?  Has patient had a PCN reaction causing immediate rash, facial/tongue/throat swelling, SOB or lightheadedness with hypotension: Yes ?Has patient had a PCN reaction causing severe rash involving mucus membranes or skin necrosis: No ?Has patient had a PCN reaction that required hospitalization: No ?Has patient had a PCN reaction occurring within the last 10 years: No ?If all of the above answers are "NO", then may proceed with Cephalosporin use. ?  ? Topamax [Topiramate] Rash  ? ? ?Labs:  ?Results for orders placed or performed during the hospital encounter of 11/24/21 (from the past 48 hour(s))  ?CBC with Differential     Status: None  ? Collection Time: 11/24/21  5:42 PM  ?Result Value Ref Range  ? WBC 5.7 4.0 - 10.5 K/uL  ? RBC 4.47 3.87 - 5.11 MIL/uL  ? Hemoglobin 13.9 12.0 - 15.0 g/dL  ? HCT 41.5 36.0 - 46.0 %  ? MCV 92.8 80.0 - 100.0 fL  ? MCH 31.1 26.0 - 34.0 pg  ? MCHC 33.5 30.0 - 36.0 g/dL  ? RDW 12.2 11.5 - 15.5 %  ? Platelets 187 150 - 400 K/uL  ? nRBC 0.0 0.0 - 0.2 %  ? Neutrophils Relative % 68 %  ? Neutro Abs 3.9 1.7 - 7.7 K/uL  ? Lymphocytes Relative 20 %  ? Lymphs Abs 1.1 0.7 - 4.0 K/uL  ? Monocytes  Relative 8 %  ? Monocytes Absolute 0.5 0.1 - 1.0 K/uL  ? Eosinophils Relative 3 %  ? Eosinophils Absolute 0.2 0.0 - 0.5 K/uL  ? Basophils Relative 1 %  ? Basophils Absolute 0.0 0.0 - 0.1 K/uL  ? Immature Granulocytes

## 2021-11-25 NOTE — ED Notes (Signed)
Pt brief changed and new blankets provided. ?

## 2021-11-25 NOTE — ED Notes (Signed)
PT assisted to walk to the bathroom. ?

## 2021-11-25 NOTE — NC FL2 (Signed)
?[place] MEDICAID FL2 LEVEL OF CARE SCREENING TOOL  ?  ? ?IDENTIFICATION  ?Patient Name: ?[name] Birthdate: [birth year] Sex: female Admission Date (Current Location): ?11/24/2021  ?[place] and [place] Number: ? [place] ?[id number] A Facility and Address:  ?[facility],  [place], [place] ?     Provider Number: ?[id number]  ?Attending Physician Name and Address:  ?Default, Provider, MD ? Relative Name and Phone Number:  ?Adria Devon Other     [phone] ?   ?Current Level of Care: ?Hospital Recommended Level of Care: ?[place] Prior Approval Number: ?  ? ?Date Approved/Denied: ?  PASRR Number: ?[id number] A ? ?Discharge Plan: ?SNF ?  ? ?Current Diagnoses: ?Patient Active Problem List  ? Diagnosis Date Noted  ? Suicide gesture ([place]) 11/24/2021  ? Tinea corporis 04/21/2021  ? Positive colorectal cancer screening using Cologuard test 10/03/2020  ? Chronic idiopathic constipation 09/24/2020  ? Cervical radiculopathy 01/10/2020  ? NASH (nonalcoholic steatohepatitis) 01/10/2020  ? Seasonal allergic rhinitis due to pollen 10/24/2018  ? Suicidal ideation   ? Carpal tunnel syndrome of left wrist 03/07/2018  ? 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 Simple chronic bronchitis ([place]) 06/28/2017  ? Migraine without aura and with status migrainosus, not intractable 03/04/2016  ? Left ventricular diastolic dysfunction, NYHA class 2 01/21/2016  ? Morbid obesity ([place]) 01/21/2016  ? 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 Cervical radiculitis 08/07/2015  ? GAD (generalized anxiety disorder) 07/18/2014  ? Primary osteoarthritis of both knees 05/23/2014  ? Visit for screening mammogram 05/22/2014  ? Hyperlipidemia with target LDL less than 100 05/22/2014  ? Vitamin D deficiency 12/28/2013  ? Estrogen deficiency 12/27/2013  ? Routine general medical examination at a health care facility 12/27/2013  ? Type II diabetes  mellitus with manifestations ([place]) 08/15/2013  ? OAB (overactive bladder) 08/15/2013  ? Vitamin B12 deficiency anemia 06/05/2010  ? HYDROCEPHALUS, NORMAL PRESSURE 06/03/2010  ? Anxiety 05/15/2008  ? Essential hypertension 05/15/2008  ? ? ?Orientation RESPIRATION BLADDER Height & Weight   ?  ?Self, Time, Situation, Place ? Normal Continent Weight: 192 lb (87.1 kg) (per record) ?Height:  '5\' 5"'$  (165.1 cm)  ?BEHAVIORAL SYMPTOMS/MOOD NEUROLOGICAL BOWEL NUTRITION STATUS  ?    Continent Diet (regualr)  ?AMBULATORY STATUS COMMUNICATION OF NEEDS Skin   ?Independent Verbally Normal ?  ?  ?  ?    ?     ?     ? ? ?Personal Care Assistance Level of Assistance  ?Bathing, Feeding, Dressing Bathing Assistance: Independent ?Feeding assistance: Independent ?Dressing Assistance: Independent ?   ? ?Functional Limitations Info  ?Sight, Hearing, Speech Sight Info: Adequate ?Hearing Info: Adequate ?Speech Info: Adequate  ? ? ?SPECIAL CARE FACTORS FREQUENCY  ?    ?  ?  ?  ?  ?  ?  ?   ? ? ?Contractures Contractures Info: Not present  ? ? ?Additional Factors Info  ?Code Status, Allergies Code Status Info: full ?Allergies Info: Metformin And Related,Ampicillin,Onion,Ace Inhibitors,Erythromycin,Penicillins ?  ?  ?  ?   ? ?Current Medications (11/25/2021):  This is the current hospital active medication list ?Current Facility-Administered Medications  ?Medication Dose Route Frequency Provider Last Rate Last Admin  ? acetaminophen (TYLENOL) tablet 1,000 mg  1,000 mg Oral Q6H PRN Deno Etienne, DO   1,000 mg at 11/25/21 1021  ? amLODipine (NORVASC) tablet 2.5 mg  2.5 mg Oral q AM Drenda Freeze, MD   2.5 mg  at 11/25/21 1021  ? [START ON 11/26/2021] ARIPiprazole (ABILIFY) tablet 2 mg  2 mg Oral Daily Drenda Freeze, MD      ? linaclotide Rolan Lipa) capsule 145 mcg  145 mcg Oral QAC breakfast Drenda Freeze, MD      ? prazosin (MINIPRESS) capsule 2 mg  2 mg Oral QHS Drenda Freeze, MD   2 mg at 11/24/21 2108  ?  sulfamethoxazole-trimethoprim (BACTRIM DS) 800-160 MG per tablet 1 tablet  1 tablet Oral Q12H Drenda Freeze, MD   1 tablet at 11/25/21 1022  ? ?Current Outpatient Medications  ?Medication Sig Dispense Refill  ? acetaminophen (TYLENOL) 325 MG tablet Take 650 mg by mouth every 6 (six) hours as needed (for pain).    ? amLODipine (NORVASC) 2.5 MG tablet Take 2.5 mg by mouth in the morning. (0900)    ? ARIPiprazole (ABILIFY) 2 MG tablet Take 2 mg by mouth at bedtime. Dr. Toy Care; need correct dosage    ? bisacodyl (DULCOLAX) 10 MG suppository Place 10 mg rectally daily as needed for moderate constipation.    ? desvenlafaxine (PRISTIQ) 100 MG 24 hr tablet Take 100 mg by mouth every evening. (2100)    ? gabapentin (NEURONTIN) 300 MG capsule Take 300 mg by mouth 3 (three) times daily.    ? lidocaine (LIDODERM) 5 % Place 1 patch onto the skin daily. Remove & Discard patch within 12 hours or as directed by MD (Patient taking differently: Place 1 patch onto the skin every 12 (twelve) hours as needed (pain). Remove & Discard patch within 12 hours or as directed by MD) 30 patch 5  ? linaclotide (LINZESS) 145 MCG CAPS capsule Take 1 capsule (145 mcg total) by mouth daily before breakfast. 90 capsule 1  ? LORazepam (ATIVAN) 1 MG tablet Take 1 tablet (1 mg total) by mouth every 8 (eight) hours as needed for anxiety. 90 tablet 3  ? LORazepam (ATIVAN) 1 MG tablet Take 1 mg by mouth every 8 (eight) hours.    ? miconazole (ANTIFUNGAL) 2 % powder Apply 1 application. topically 2 (two) times daily. Apply to breast and abdominal fold    ? mirabegron ER (MYRBETRIQ) 25 MG TB24 tablet Take 1 tablet (25 mg total) by mouth daily. (Patient taking differently: Take 25 mg by mouth at bedtime.) 90 tablet 1  ? ondansetron (ZOFRAN) 4 MG tablet Take 4 mg by mouth every 6 (six) hours as needed for nausea or vomiting.    ? pantoprazole (PROTONIX) 40 MG tablet Take 1 tablet (40 mg total) by mouth daily. (Patient taking differently: Take 40 mg by mouth  in the morning. (0800)) 90 tablet 3  ? polyethylene glycol (MIRALAX / GLYCOLAX) 17 g packet Take 17 g by mouth daily.    ? prazosin (MINIPRESS) 2 MG capsule Take 2 mg by mouth at bedtime. (2100)    ? Skin Protectants, Misc. (WHITE PETROLATUM-ZINC) 58.3 % cream Apply 1 application. topically at bedtime.    ? solifenacin (VESICARE) 5 MG tablet Take 1 tablet (5 mg total) by mouth daily. (Patient taking differently: Take 5 mg by mouth at bedtime.) 90 tablet 1  ? sulfamethoxazole-trimethoprim (BACTRIM DS) 800-160 MG tablet Take 1 tablet by mouth 2 (two) times daily for 4 days. 8 tablet 0  ? Tiotropium Bromide Monohydrate (SPIRIVA RESPIMAT) 2.5 MCG/ACT AERS Inhale 2 puffs into the lungs daily. (Patient taking differently: Inhale 2 puffs into the lungs daily. (0900)) 12 g 1  ? traMADol (ULTRAM) 50 MG tablet  Take 50 mg by mouth 2 (two) times daily.    ? zolpidem (AMBIEN) 5 MG tablet Take 5 mg by mouth at bedtime.    ? atorvastatin (LIPITOR) 40 MG tablet Take 1 tablet (40 mg total) by mouth daily. (Patient taking differently: Take 40 mg by mouth every evening. (2100)) 90 tablet 1  ? Semaglutide (RYBELSUS) 3 MG TABS Take 3 mg by mouth daily. (Patient not taking: Reported on 11/25/2021) 30 tablet 0  ? ? ? ?Discharge Medications: ?Please see discharge summary for a list of discharge medications. ? ?Relevant Imaging Results: ? ?Relevant Lab Results: ? ? ?Additional Information ?SSN [id number] ? ?Britney Captain M Harvis Mabus, LCSW ? ? ? ? ?

## 2021-11-26 ENCOUNTER — Telehealth: Payer: Self-pay | Admitting: Internal Medicine

## 2021-11-26 MED ORDER — VENLAFAXINE HCL ER 37.5 MG PO CP24
37.5000 mg | ORAL_CAPSULE | Freq: Every day | ORAL | Status: DC
Start: 2021-11-26 — End: 2021-12-02
  Administered 2021-11-26 – 2021-12-02 (×7): 37.5 mg via ORAL
  Filled 2021-11-26 (×8): qty 1

## 2021-11-26 MED ORDER — MIRABEGRON ER 25 MG PO TB24
25.0000 mg | ORAL_TABLET | Freq: Every day | ORAL | Status: DC
  Administered 2021-11-26 – 2021-12-01 (×7): 25 mg via ORAL
  Filled 2021-11-26 (×8): qty 1

## 2021-11-26 MED ORDER — LORAZEPAM 1 MG PO TABS
1.0000 mg | ORAL_TABLET | Freq: Three times a day (TID) | ORAL | Status: DC | PRN
  Administered 2021-11-26 – 2021-12-02 (×9): 1 mg via ORAL
  Filled 2021-11-26 (×9): qty 1

## 2021-11-26 MED ORDER — GABAPENTIN 300 MG PO CAPS
300.0000 mg | ORAL_CAPSULE | Freq: Three times a day (TID) | ORAL | Status: DC
  Administered 2021-11-26 – 2021-12-02 (×19): 300 mg via ORAL
  Filled 2021-11-26 (×18): qty 1

## 2021-11-26 MED ORDER — DARIFENACIN HYDROBROMIDE ER 7.5 MG PO TB24
7.5000 mg | ORAL_TABLET | Freq: Every day | ORAL | Status: DC
Start: 2021-11-26 — End: 2021-12-02
  Administered 2021-11-26 – 2021-12-02 (×7): 7.5 mg via ORAL
  Filled 2021-11-26 (×7): qty 1

## 2021-11-26 NOTE — Progress Notes (Signed)
Manufacturing engineer [facility]) Hospital Liasion Note ?  ?This is a current hospice patient with [facility].  ? ?We will continue to follow for discharge planning during this hospitalization and support as needed until discharge.   ? ?Please reach out with any hospice related questions or concerns, ?  ? Gar Ponto, RN ?[facility] Liaison  ?[phone]  ?  ?  ?  ? ? ? ?

## 2021-11-26 NOTE — NC FL2 (Signed)
?[place] MEDICAID FL2 LEVEL OF CARE SCREENING TOOL  ?  ? ?IDENTIFICATION  ?Patient Name: ?[name] Birthdate: 20-May-1946 Sex: female Admission Date (Current Location): ?11/24/2021  ?[place] and [place] Number: ? [place] ?[id number] A Facility and Address:  ?[facility],  [place], [place] ?     Provider Number: ?[id number]  ?Attending Physician Name and Address:  ?Default, Provider, MD ? Relative Name and Phone Number:  ?Adria Devon Other     [phone] ?   ?Current Level of Care: ?Hospital Recommended Level of Care: ?Assisted Living Facility Prior Approval Number: ?  ? ?Date Approved/Denied: ?  PASRR Number: ?[id number] A ? ?Discharge Plan: ?SNF ?  ? ?Current Diagnoses: ?Patient Active Problem List  ? Diagnosis Date Noted  ? Suicide gesture ([place]) 11/24/2021  ? Tinea corporis 04/21/2021  ? Positive colorectal cancer screening using Cologuard test 10/03/2020  ? Chronic idiopathic constipation 09/24/2020  ? Cervical radiculopathy 01/10/2020  ? NASH (nonalcoholic steatohepatitis) 01/10/2020  ? Seasonal allergic rhinitis due to pollen 10/24/2018  ? Suicidal ideation   ? Carpal tunnel syndrome of left wrist 03/07/2018  ? 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 Simple chronic bronchitis ([place]) 06/28/2017  ? Migraine without aura and with status migrainosus, not intractable 03/04/2016  ? Left ventricular diastolic dysfunction, NYHA class 2 01/21/2016  ? Morbid obesity ([place]) 01/21/2016  ? 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 Cervical radiculitis 08/07/2015  ? GAD (generalized anxiety disorder) 07/18/2014  ? Primary osteoarthritis of both knees 05/23/2014  ? Visit for screening mammogram 05/22/2014  ? Hyperlipidemia with target LDL less than 100 05/22/2014  ? Vitamin D deficiency 12/28/2013  ? Estrogen deficiency 12/27/2013  ? Routine general medical examination at a health care facility 12/27/2013  ? Type II diabetes  mellitus with manifestations ([place]) 08/15/2013  ? OAB (overactive bladder) 08/15/2013  ? Vitamin B12 deficiency anemia 06/05/2010  ? HYDROCEPHALUS, NORMAL PRESSURE 06/03/2010  ? Anxiety 05/15/2008  ? Essential hypertension 05/15/2008  ? ? ?Orientation RESPIRATION BLADDER Height & Weight   ?  ?Self, Time, Situation, Place ? Normal Continent Weight: 192 lb (87.1 kg) (per record) ?Height:  '5\' 5"'$  (165.1 cm)  ?BEHAVIORAL SYMPTOMS/MOOD NEUROLOGICAL BOWEL NUTRITION STATUS  ?    Continent Diet (regualr)  ?AMBULATORY STATUS COMMUNICATION OF NEEDS Skin   ?Independent Verbally Normal ?  ?  ?  ?    ?     ?     ? ? ?Personal Care Assistance Level of Assistance  ?Bathing, Feeding, Dressing Bathing Assistance: Independent ?Feeding assistance: Independent ?Dressing Assistance: Independent ?   ? ?Functional Limitations Info  ?Sight, Hearing, Speech Sight Info: Adequate ?Hearing Info: Adequate ?Speech Info: Adequate  ? ? ?SPECIAL CARE FACTORS FREQUENCY  ?    ?  ?  ?  ?  ?  ?  ?   ? ? ?Contractures Contractures Info: Not present  ? ? ?Additional Factors Info  ?Code Status, Allergies Code Status Info: full ?Allergies Info: Metformin And Related,Ampicillin,Onion,Ace Inhibitors,Erythromycin,Penicillins ?  ?  ?  ?   ? ?Current Medications (11/26/2021):  This is the current hospital active medication list ?Current Facility-Administered Medications  ?Medication Dose Route Frequency Provider Last Rate Last Admin  ? acetaminophen (TYLENOL) tablet 1,000 mg  1,000 mg Oral Q6H PRN Deno Etienne, DO   1,000 mg at 11/26/21 1434  ? amLODipine (NORVASC) tablet 2.5 mg  2.5 mg Oral q AM Drenda Freeze, MD   2.5 mg  at 11/26/21 0911  ? ARIPiprazole (ABILIFY) tablet 2 mg  2 mg Oral Daily Drenda Freeze, MD   2 mg at 11/26/21 9563  ? darifenacin (ENABLEX) 24 hr tablet 7.5 mg  7.5 mg Oral Daily Lajean Saver, MD   7.5 mg at 11/26/21 0930  ? gabapentin (NEURONTIN) capsule 300 mg  300 mg Oral TID Lajean Saver, MD   300 mg at 11/26/21 1536  ? lidocaine  (LIDODERM) 5 % 1 patch  1 patch Transdermal Q24H Kommor, [place], MD   1 patch at 11/25/21 2037  ? linaclotide (LINZESS) capsule 145 mcg  145 mcg Oral QAC breakfast Drenda Freeze, MD   145 mcg at 11/26/21 [phone]  ? LORazepam (ATIVAN) tablet 1 mg  1 mg Oral Q8H PRN Lajean Saver, MD   1 mg at 11/26/21 0847  ? mirabegron ER (MYRBETRIQ) tablet 25 mg  25 mg Oral QHS Lajean Saver, MD   25 mg at 11/26/21 0106  ? prazosin (MINIPRESS) capsule 2 mg  2 mg Oral QHS Drenda Freeze, MD   2 mg at 11/25/21 2107  ? sulfamethoxazole-trimethoprim (BACTRIM DS) 800-160 MG per tablet 1 tablet  1 tablet Oral Q12H Drenda Freeze, MD   1 tablet at 11/26/21 0910  ? venlafaxine XR (EFFEXOR-XR) 24 hr capsule 37.5 mg  37.5 mg Oral Q breakfast Lajean Saver, MD   37.5 mg at 11/26/21 0843  ? ?Current Outpatient Medications  ?Medication Sig Dispense Refill  ? acetaminophen (TYLENOL) 325 MG tablet Take 650 mg by mouth every 6 (six) hours as needed (for pain).    ? amLODipine (NORVASC) 2.5 MG tablet Take 2.5 mg by mouth in the morning. (0900)    ? ARIPiprazole (ABILIFY) 2 MG tablet Take 2 mg by mouth at bedtime. Dr. Toy Care; need correct dosage    ? bisacodyl (DULCOLAX) 10 MG suppository Place 10 mg rectally daily as needed for moderate constipation.    ? desvenlafaxine (PRISTIQ) 100 MG 24 hr tablet Take 100 mg by mouth every evening. (2100)    ? gabapentin (NEURONTIN) 300 MG capsule Take 300 mg by mouth 3 (three) times daily.    ? lidocaine (LIDODERM) 5 % Place 1 patch onto the skin daily. Remove & Discard patch within 12 hours or as directed by MD (Patient taking differently: Place 1 patch onto the skin every 12 (twelve) hours as needed (pain). Remove & Discard patch within 12 hours or as directed by MD) 30 patch 5  ? linaclotide (LINZESS) 145 MCG CAPS capsule Take 1 capsule (145 mcg total) by mouth daily before breakfast. 90 capsule 1  ? LORazepam (ATIVAN) 1 MG tablet Take 1 tablet (1 mg total) by mouth every 8 (eight) hours as needed  for anxiety. 90 tablet 3  ? LORazepam (ATIVAN) 1 MG tablet Take 1 mg by mouth every 8 (eight) hours.    ? miconazole (ANTIFUNGAL) 2 % powder Apply 1 application. topically 2 (two) times daily. Apply to breast and abdominal fold    ? mirabegron ER (MYRBETRIQ) 25 MG TB24 tablet Take 1 tablet (25 mg total) by mouth daily. (Patient taking differently: Take 25 mg by mouth at bedtime.) 90 tablet 1  ? ondansetron (ZOFRAN) 4 MG tablet Take 4 mg by mouth every 6 (six) hours as needed for nausea or vomiting.    ? pantoprazole (PROTONIX) 40 MG tablet Take 1 tablet (40 mg total) by mouth daily. (Patient taking differently: Take 40 mg by mouth in the morning. (0800)) 90 tablet  3  ? polyethylene glycol (MIRALAX / GLYCOLAX) 17 g packet Take 17 g by mouth daily.    ? prazosin (MINIPRESS) 2 MG capsule Take 2 mg by mouth at bedtime. (2100)    ? Skin Protectants, Misc. (WHITE PETROLATUM-ZINC) 58.3 % cream Apply 1 application. topically at bedtime.    ? solifenacin (VESICARE) 5 MG tablet Take 1 tablet (5 mg total) by mouth daily. (Patient taking differently: Take 5 mg by mouth at bedtime.) 90 tablet 1  ? sulfamethoxazole-trimethoprim (BACTRIM DS) 800-160 MG tablet Take 1 tablet by mouth 2 (two) times daily for 4 days. 8 tablet 0  ? Tiotropium Bromide Monohydrate (SPIRIVA RESPIMAT) 2.5 MCG/ACT AERS Inhale 2 puffs into the lungs daily. (Patient taking differently: Inhale 2 puffs into the lungs daily. (0900)) 12 g 1  ? traMADol (ULTRAM) 50 MG tablet Take 50 mg by mouth 2 (two) times daily.    ? zolpidem (AMBIEN) 5 MG tablet Take 5 mg by mouth at bedtime.    ? atorvastatin (LIPITOR) 40 MG tablet Take 1 tablet (40 mg total) by mouth daily. (Patient taking differently: Take 40 mg by mouth every evening. (2100)) 90 tablet 1  ? Semaglutide (RYBELSUS) 3 MG TABS Take 3 mg by mouth daily. (Patient not taking: Reported on 11/25/2021) 30 tablet 0  ? ? ? ?Discharge Medications: ?Please see discharge summary for a list of discharge  medications. ? ?Relevant Imaging Results: ? ?Relevant Lab Results: ? ? ?Additional Information ?SSN [id number] ? ?Bane Hagy W See Beharry, LCSW ? ? ? ? ?

## 2021-11-26 NOTE — Progress Notes (Signed)
CSW made contact to [facility], [facility], [facility] no beds available. CSW sent secure e-mail to [facility] which they did have an open bed waiting for approval.  ?

## 2021-11-26 NOTE — Telephone Encounter (Signed)
[name] called just to give an FYI that pt was admitted to hospital 4.25.23 and is currently still in treatment there.  ? ?

## 2021-11-26 NOTE — ED Provider Notes (Addendum)
Emergency Medicine Observation Re-evaluation Note ? ?KATHELINE BRENDLINGER is a 76 y.o. female, seen on rounds today.  Pt initially presented to the ED for complaints of Suicidal ?Currently, the patient is resting. ? ?Physical Exam  ?BP 119/66   Pulse 89   Temp 99.1 ?F (37.3 ?C) (Oral)   Resp 15   Ht '5\' 5"'$  (1.651 m)   Wt 87.1 kg Comment: per record  SpO2 99%   BMI 31.95 kg/m?  ?Physical Exam ?General: awake, resting easy ? ?ED Course / MDM  ?EKG:  ? ?I have reviewed the labs performed to date as well as medications administered while in observation.  Recent changes in the last 24 hours include nothing. ? ?Plan  ?Current plan is for TOC placement. ? KATHELYN GOMBOS is under involuntary commitment.  Psych has cleared the patient but they thought it would be a good idea to keep IVC at this time in case we need to force medications and keep patient restrained ? ? ?  ?Lennice Sites, DO ?11/26/21 5456 ? ?  ?Lennice Sites, DO ?11/26/21 2563 ? ?

## 2021-11-26 NOTE — Progress Notes (Addendum)
CSW talked to [facility] worker who has POA, the Pt has been denied at many facilities. CSW explain's to the POA that the Pt may have to go back to the facility she was at due to the Pt. Current mental state as well as Pt. not having a proper diagnosis  for memory care facility. CSW will continue to reach out to facilities for the Pt. ?

## 2021-11-26 NOTE — ED Notes (Signed)
Pt is in a hospital bed ?

## 2021-11-26 NOTE — Progress Notes (Signed)
CSW reached out to [facility], and [facility] who all declined pt.  ? ?CSW called [facility] admission with the [facility] , left VM requesting a call back. ? ?Arlie Solomons.Jessey Huyett, MSW, [place] ?[place]  Transitions of Care ?Clinical Social Worker I ?Direct Dial: [phone]  Fax: [phone] ?[email or web address]  ? ? ? ? ?

## 2021-11-26 NOTE — ED Notes (Signed)
Pt assisted to the bathroom.

## 2021-11-26 NOTE — ED Notes (Signed)
Pt ambulated to bathroom with walker.

## 2021-11-26 NOTE — ED Notes (Signed)
Lunch tray given. 

## 2021-11-26 NOTE — ED Notes (Signed)
Myrbetriq not in med bin in pyxis. Messaged pharmacy asking them to send medication up. JRPRN ?

## 2021-11-26 NOTE — ED Notes (Signed)
Patient ambulated to the restroom with her walker. Patient states her pain has improved since having the lidocaine patch put on her left knee. JRPRN ?

## 2021-11-26 NOTE — ED Notes (Signed)
PT brief changed. ?

## 2021-11-26 NOTE — ED Notes (Signed)
Patient frequently getting out of bed and walking behind nurses station with walker.  ?

## 2021-11-26 NOTE — Progress Notes (Signed)
RNCM received call from [facility] with [facility] stating she was following patient and questioning if any placement has been found yet. This RNCM advised no placement as of yet any assistance [facility] could provide will be accepted. Currently TOC team Is awaiting one response from SNF.  ? ? ?TOC will continue to follow. ?

## 2021-11-26 NOTE — ED Notes (Signed)
I will update pt vitals when she awakes. ?

## 2021-11-26 NOTE — ED Notes (Signed)
Pt ambulatory to bathroom with walker.

## 2021-11-27 LAB — URINE CULTURE: Culture: >=100000 — AB

## 2021-11-27 MED ORDER — ONDANSETRON 4 MG PO TBDP
4.0000 mg | ORAL_TABLET | Freq: Three times a day (TID) | ORAL | Status: DC | PRN
  Administered 2021-11-27 – 2021-11-30 (×4): 4 mg via ORAL
  Filled 2021-11-27 (×4): qty 1

## 2021-11-27 MED ORDER — TUBERCULIN PPD 5 UNIT/0.1ML ID SOLN
5.0000 [IU] | INTRADERMAL | Status: AC
  Administered 2021-11-27: 5 [IU] via INTRADERMAL
  Filled 2021-11-27: qty 0.1

## 2021-11-27 NOTE — Progress Notes (Signed)
CSW sent a referral out to [facility] and [facility] for placement . CSW contact [facility] no response left HIPPA complaint VM reqesting a rertun call. CSW sent follow up e-mail to [facility], awaiting response. TOC to follow. ? ?[name].[name], MSW, [place] ?[place]  Transitions of Care ?Clinical Social Worker I ?Direct Dial: [phone]  Fax: [phone] ?[email or web address]  ?

## 2021-11-27 NOTE — ED Notes (Signed)
Patient assisted to the restroom 

## 2021-11-27 NOTE — ED Notes (Signed)
Patient was given a cup of ice. 

## 2021-11-27 NOTE — ED Notes (Signed)
Pt given maroon scrubs ?

## 2021-11-27 NOTE — Progress Notes (Signed)
Manufacturing engineer [facility]) Hospital Liaison Note ? ?ACC continues to follow while hospitalized. TOC continues to search for placement outside of hospital. [facility] IDT has been updated and aware of plan. [facility] leadership will follow up with patient's PCG Bonnie.  ? ?Please call with any questions or concerns. Thank you.  ?

## 2021-11-27 NOTE — ED Notes (Addendum)
Patient requested to walk. I walked with her around in TCU for a few minutes. Patient back in bed.   ?

## 2021-11-27 NOTE — ED Notes (Signed)
Pt c/o nausea, given sandwich to see if that amends it. ?

## 2021-11-27 NOTE — ED Provider Notes (Signed)
Emergency Medicine Observation Re-evaluation Note ? ?[name] is a 76 y.o. female, seen on rounds today.  Pt initially presented to the ED for complaints of Suicidal ?Currently, the patient is sleep. ? ?Physical Exam  ?BP (!) 146/69 (BP Location: Right Arm)   Pulse 77   Temp 97.7 ?F (36.5 ?C) (Oral)   Resp 16   Ht '5\' 5"'$  (1.651 m)   Wt 87.1 kg Comment: per record  SpO2 98%   BMI 31.95 kg/m?  ?Physical Exam ?General: asleep ?Cardiac: not asleep ?Lungs: normal work of breathing ?Psych: not assessed, asleep ? ?ED Course / MDM  ?EKG:  ? ?I have reviewed the labs performed to date as well as medications administered while in observation.  Recent changes in the last 24 hours include multiple psychiatric meds ordered. ? ?Plan  ?Current plan is for placement. Urine culture reviewed and positive, sensitive to bactrim, which she is on. ? TAMELIA MICHALOWSKI is not under involuntary commitment. ? ? ?  ?Sherwood Gambler, MD ?11/27/21 [phone] ? ?

## 2021-11-27 NOTE — ED Notes (Signed)
Patient was given her dinner tray.

## 2021-11-27 NOTE — Progress Notes (Signed)
[facility] in [facility] to [facility]. The Facility may have a bed available in the beginning of next week. CSW sent over the [facility] for the facility to review. The CSW also put in a order for the provider to give the patient a TB test.  ?

## 2021-11-28 NOTE — ED Notes (Signed)
Patient alert this shift.  Cooperative. Appropriate with staff. Medication compliant. Anxious at times.  Patient stated, does not want to be here anymore.  No homicidal ideation noted.  ?

## 2021-11-28 NOTE — ED Notes (Signed)
[facility]) Care Management ? ?11/28/2021 ? ?[name] ?Dec 22, 1945 ?[id number] ? ?Patient was residing at [facility] [phone]), but unable to return, now requiring a higher level of care. ?FL-2 Form faxed to all of the following facilities and follow-up call placed to try and pursue bed offers: ? ?[facility] [phone]) ?  * Declined bed offer * ?[facility] [phone]) ?  * No female beds available * ?[facility] [phone]) ?  * HIPAA compliant message left on voicemail for admissions department * ?[facility] [phone]) ?  * Declined bed offer * ?Tourist information centre manager [phone]) ?  * No female beds available * ?[facility] [phone]) ?  * HIPAA compliant message left on voicemail for admissions department * ?[facility] in [place] [phone]) ?  * Declined bed offer * ?[facility] in [place] [phone]) ?  * Declined bed offer * ?[facility] [phone]) ?  * No female beds available * ?[facility] [phone]) ?  * HIPAA compliant message left on voicemail for admissions department * ?[facility] [phone]) ?  * Bed offer rescinded, due to change in medical status * ?[facility] [phone])   ?  * No female beds available * ?[facility] [phone]) ?  * Female beds available - awaiting administrator approval * ?[facility] [phone]) ?  * HIPAA complaint message left on voicemail for admissions department * ?[facility] [phone]) ?  * HIPAA compliant message left on voicemail for admissions department * ? ?[facility] reported possible bed availability on Monday, May 1st, or Tuesday, May 2nd.  TOC Team to follow-up on Monday, May 1st, as admissions department is unavailable on weekend. ?[facility] reported bed availability, but Hotel manager approval.  Determination to be made by administrator on Monday, May 1st, as unavailable on weekend. ?~ Patient does not currently qualify for long-term memory care assisted living facility placement, as she does not have a supporting diagnosis.  To qualify for memory care, a senior must be diagnosed with one of many forms of dementia or cognitive impairment.  ?~ TB Skin Test will need to be ordered and administered, prior to admission into any of the above-named facilities. ?~ Patient continues to be followed by [facility]. ?~ CSW will continue to follow. ? ?Nat Christen, BSW, MSW, LCSW  ?Licensed Clinical Social Worker  ?[facility]  ?Mailing Address-[address] [place], [place] [address] ?Physical Address-[address], [place], [place] [address] ?Toll Free Main # [phone] ?Fax # [phone] ?Cell # [phone] ?[email or web address] ? ? ? ? ? ?  ? ?

## 2021-11-28 NOTE — Progress Notes (Signed)
Manufacturing engineer [facility]) Hospital Liaison Note ? ?[place] continue to follow for disposition and coordination of care.  ?Currently patient may be able to transfer to [facility] next week per MD notes.  ? ?[facility] IDT has been updated and aware of plan. [facility] leadership will follow up with patient's PCG Bonnie. ? ?A Please do not hesitate to call with questions.    ? ?Thank you,    ?Farrel Gordon, RN, CCM       ?[place] (listed on AMION under [place])     ?[phone] ?

## 2021-11-28 NOTE — ED Provider Notes (Signed)
Emergency Medicine Observation Re-evaluation Note ? ?[name] is a 76 y.o. female, seen on rounds today.  Pt initially presented to the ED for complaints of Suicidal ?Currently, the patient is resting. ? ?Physical Exam  ?BP 113/72 (BP Location: Left Arm)   Pulse 78   Temp 98 ?F (36.7 ?C) (Oral)   Resp 16   Ht '5\' 5"'$  (1.651 m)   Wt 87.1 kg Comment: per record  SpO2 92%   BMI 31.95 kg/m?  ?Physical Exam ?General: NAD ?Cardiac: well perfused ?Lungs: even and unlabored ?Psych: no agitation ? ?ED Course / MDM  ?EKG:  ? ?I have reviewed the labs performed to date as well as medications administered while in observation.  Recent changes in the last 24 hours include most recent social work note 4/28: [facility] in [facility] to [facility]. The Facility may have a bed available in the beginning of next week. CSW sent over the [facility] for the facility to review. The CSW also put in a order for the provider to give the patient a TB test. Of note, patient is medically and psych cleared. ? ?Plan  ?Current plan is for social work pending placement. ? [name] is not under involuntary commitment. ? ? ?  ?Regan Lemming, MD ?11/28/21 1610 ? ?

## 2021-11-29 MED ORDER — AMLODIPINE BESYLATE 5 MG PO TABS
2.5000 mg | ORAL_TABLET | Freq: Once | ORAL | Status: AC
  Administered 2021-11-29: 2.5 mg via ORAL
  Filled 2021-11-29: qty 1

## 2021-11-29 NOTE — Progress Notes (Signed)
11/29/2021  1815  Notified MD of patient's BP 172/92. ?

## 2021-11-29 NOTE — Progress Notes (Signed)
Manufacturing engineer [facility]) Hospital Liaison Note ?  ?[place] continue to follow for disposition and coordination of care.  ?Currently patient may be able to transfer to [facility] or [facility] first of week.  ?  ?ACC IDT has been updated and aware of plan. [facility] leadership will follow up with patient's PCG Bonnie. ?  ?A Please do not hesitate to call with questions.    ?  ?Thank you,    ?Farrel Gordon, RN, CCM       ?[place] (listed on AMION under [place])     ?[phone] ?

## 2021-11-29 NOTE — ED Provider Notes (Signed)
Emergency Medicine Observation Re-evaluation Note ? ?[name] is a 76 y.o. female, seen on rounds today.  Pt initially presented to the ED for complaints of Suicidal ?Currently, the patient is resting. ? ?Physical Exam  ?BP 136/85 (BP Location: Right Arm)   Pulse (!) 102   Temp 98.7 ?F (37.1 ?C) (Oral)   Resp 18   Ht '5\' 5"'$  (1.651 m)   Wt 87.1 kg Comment: per record  SpO2 96%   BMI 31.95 kg/m?  ?Physical Exam ?General: NAD ?Cardiac: Well-perfused ?Lungs: Equal chest rise ?Psych: Calm ? ?ED Course / MDM  ?EKG:  ? ?I have reviewed the labs performed to date as well as medications administered while in observation.  Recent changes in the last 24 hours include none.  Patient did report that she is no longer homicidal. ? ?Plan  ?Current plan is for placement. ?[name] is under involuntary commitment. ?  ? ?  ?Kiara Sparrow, DO ?11/29/21 [phone] ? ?

## 2021-11-30 LAB — OCCULT BLOOD GASTRIC / DUODENUM (SPECIMEN CUP)
Occult Blood, Gastric: POSITIVE — AB
pH, Gastric: 3

## 2021-11-30 NOTE — Progress Notes (Signed)
CSW called [facility], they have received the [facility] still waiting to be reviewed. ?

## 2021-11-30 NOTE — ED Notes (Signed)
Assisted pt with getting up and using restroom. ?

## 2021-11-30 NOTE — Progress Notes (Addendum)
RNCM spoke with Mickel Baas at [facility] who advised they don't have any LTC female beds at this time.  ? ?RNCM spoke with [facility] who advised we can fax request to [place] or [facility] at fax # [phone].   ?Addendum: Received successful treatment confirmation. At 1512 ?

## 2021-11-30 NOTE — ED Notes (Signed)
PT complaining of nausea, PRN zofran given and patient advised not to eat or drink anything  ?

## 2021-11-30 NOTE — Progress Notes (Signed)
[facility] never replied /call back. CSW sent referral to [facility] no update since. The provider put in the consult for the neurology exam however neurology stated that psych needed to do it since that was in their realm. CSW asked provider for contact information however the provider never replied.Once we get them to approve that we should be good.The provider may have sent it over to psych we just need to follow up.  ?

## 2021-11-30 NOTE — Progress Notes (Signed)
CSW faxed over FL2 to [facility] facility.  ? ?

## 2021-11-30 NOTE — ED Notes (Signed)
Patient alert this shift. Patient cooperative with care.   Medication compliant.. Appropriate with staff.  No suicidal ideation noted No homicidal ideation noted.    ?

## 2021-11-30 NOTE — ED Provider Notes (Addendum)
SW informed us that pt doesn't carry diagnosis of dementia, which has been a barrier with placement.  ?Will consult psychiatry service to assess patient formally for that diagnosis. ? ?Social work made aware. ? ?  ?Varney Biles, MD ?11/30/21 1611 ? ?

## 2021-11-30 NOTE — ED Provider Notes (Signed)
?  Physical Exam  ?BP 104/73 (BP Location: Left Arm)   Pulse 88   Temp 98 ?F (36.7 ?C) (Oral)   Resp 20   Ht '5\' 5"'$  (1.651 m)   Wt 87.1 kg Comment: per record  SpO2 95%   BMI 31.95 kg/m?  ? ?Physical Exam ? ?Procedures  ?Procedures ? ?ED Course / MDM  ?  ?Medical Decision Making ?Amount and/or Complexity of Data Reviewed ?Labs: ordered. ?Radiology: ordered. ? ?Risk ?Prescription drug management. ? ? ?Received care of patient from previous providers.  Please see the note for his prior history physical and care.  Briefly is a 76 year old female who has been in the emergency department for 136 hours awaiting placement.  Hopeful for transfer to [facility].  Sleeping at time of my evaluation. No concerns per nursing. ? ? ? ? ?  ?Gareth Morgan, MD ?11/30/21 [phone] ? ?

## 2021-11-30 NOTE — Consult Note (Signed)
At the request of DR Kathrynn Humble attempt was made to evaluate patient for Dementia.  Patient already had Dementia on arrival to the ER.   Call to [facility] to verify the Diagnosis of Dentia.  MS Clark reported that she did not know of any Dementia diagnosis.  Patient admitted an evaluation by Neurologist in 2020 when she was asked many questions.She reported that she answered most questions asked of her.  Apparently the Neurology notes are missing from the Chart.  Attempt this evening to conduct some test failed as patient reported severe anxiety going through the test again plus she is hard  at hearing and has no hearing aid. ?This information relayed to DR Kathrynn Humble.  DR Dwyane Dee to be alerted in the morning..  MS Clark reported that patient has been turned down by many facilities due to the suicide gesture she made due to her cat being taken away from her. ? ?

## 2021-11-30 NOTE — Progress Notes (Signed)
Manufacturing engineer [facility]) Hospital Liaison Note ?  ?[place] continue to follow for disposition and coordination of care. ?  ?ACC IDT has been updated and aware of plan. ? ?[facility] leadership will follow up with patient's PCG Bonnie. ?  ?A Please do not hesitate to call with questions.  ? ?Venia Carbon BSN, RN ?[facility] Liaison    ?  ?

## 2021-11-30 NOTE — Progress Notes (Signed)
[facility] ,was transferred to VM left HIPPA compliant VM.  ? ?CSW followed up with [facility], no available beds.  ? ?CSW followed up with [facility]. Spoke with receptionist she stated she will call CSW back when she is able to find out about review.  ? ?CSW followed up with [facility] , left HIPPA complaint VM , was told to call back after lunch. TOC to follow. ?

## 2021-12-01 NOTE — ED Provider Notes (Signed)
Emergency Medicine Observation Re-evaluation Note ? ?[name] is a 76 y.o. female, seen on rounds today.  Pt initially presented to the ED for complaints of Suicidal ?Currently, the patient is asleep, resting. ? ?Physical Exam  ?BP 110/72 (BP Location: Left Arm)   Pulse 89   Temp 98 ?F (36.7 ?C) (Oral)   Resp 18   Ht '5\' 5"'$  (1.651 m)   Wt 87.1 kg Comment: per record  SpO2 94%   BMI 31.95 kg/m?  ?Physical Exam ?General: NAD ?Cardiac: well perfused ?Lungs: even and unlabored ?Psych: no agitation ? ?ED Course / MDM  ?EKG:  ? ?I have reviewed the labs performed to date as well as medications administered while in observation.  Recent changes in the last 24 hours include pt evaluated by psychiatry again and cleared. She also apparently does not have a diagnosis of dementia which had previously been a barrier to placement. The patient's IVC expires today. She remains medically and psychiatrically cleared. ? ?Plan  ?Current plan is for social work coordinating placement.  ?[name] is under involuntary commitment. ?  ? ?  ?Regan Lemming, MD ?12/01/21 [phone] ? ?

## 2021-12-01 NOTE — Progress Notes (Signed)
Pt received bed offer from [facility], CSW contact [place] to present bed offer. [facility] accepted. CSW informed pt POA that the pt will need to pay first month up front. Pt's POA provided verbal understanding. TOC to follow.  ? ?Arlie Solomons.Jakhiya Brower, MSW, [place] ?[place]  Transitions of Care ?Clinical Social Worker I ?Direct Dial: [phone]  Fax: [phone] ?[email or web address]  ?

## 2021-12-01 NOTE — Progress Notes (Signed)
CSW received a message from psych stating pt will need to follow up with her OP provider for a dx of dementia. ? ?10:58am ?CSW spoke with Misty from [facility] , she reported her administer still reviewing pt's information and will follow up with hospital later today. ? ?11:10am ?CSW sent referral out to [facility] for review.  ? ? ?Arlie Solomons.Elzora Cullins, MSW, [place] ?[place]  Transitions of Care ?Clinical Social Worker I ?Direct Dial: [phone]  Fax: [phone] ?[email or web address]  ?

## 2021-12-01 NOTE — Progress Notes (Signed)
CSW received a message from the director of [facility] asking for clarity about pt's situation, as pt's POA stated pt having a dx of dementia. Pt does not have a dx of dementia at this time and does not qualify for Memory Care.  ? ?CSW contacted Tressa admission coordinator from [facility] and informed her pt does not have a dementia dx. Claudette Head stated she will review pt's information if pt receives a dementia dx.  ? ?CSW attempted to follow up with pt's POA Adria Devon to discuss the situation no response, left VM requesting a return call. TOC to follow.  ? ? ? ?Arlie Solomons.Alyanah Elliott, MSW, [place] ?[place]  Transitions of Care ?Clinical Social Worker I ?Direct Dial: [phone]  Fax: [phone] ?[email or web address]  ?

## 2021-12-01 NOTE — [place] FL2 (Signed)
?[place] MEDICAID FL2 LEVEL OF CARE SCREENING TOOL  ?  ? ?IDENTIFICATION  ?Patient Name: ?[name] Birthdate: 25-Sep-1945 Sex: female Admission Date (Current Location): ?11/24/2021  ?[place] and [place] Number: ? [place] ?[id number] A Facility and Address:  ?[facility],  [address] [place], [place] ?     Provider Number: ?[id number]  ?Attending Physician Name and Address:  ?Default, Provider, MD ? Relative Name and Phone Number:  ?Adria Devon Other     [phone] ?   ?Current Level of Care: ?Hospital Recommended Level of Care: ?[place] Prior Approval Number: ?  ? ?Date Approved/Denied: ?  PASRR Number: ?[id number] A ? ?Discharge Plan: ?SNF ?  ? ?Current Diagnoses: ?Patient Active Problem List  ? Diagnosis Date Noted  ? Suicide gesture ([place]) 11/24/2021  ? Tinea corporis 04/21/2021  ? Positive colorectal cancer screening using Cologuard test 10/03/2020  ? Chronic idiopathic constipation 09/24/2020  ? Cervical radiculopathy 01/10/2020  ? NASH (nonalcoholic steatohepatitis) 01/10/2020  ? Seasonal allergic rhinitis due to pollen 10/24/2018  ? Suicidal ideation   ? Carpal tunnel syndrome of left wrist 03/07/2018  ? Current severe episode of major depressive disorder with psychotic features ([name]) 08/15/2017  ? Simple chronic bronchitis ([place]) 06/28/2017  ? Migraine without aura and with status migrainosus, not intractable 03/04/2016  ? Left ventricular diastolic dysfunction, NYHA class 2 01/21/2016  ? Morbid obesity ([place]) 01/21/2016  ? 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 Cervical radiculitis 08/07/2015  ? GAD (generalized anxiety disorder) 07/18/2014  ? Primary osteoarthritis of both knees 05/23/2014  ? Visit for screening mammogram 05/22/2014  ? Hyperlipidemia with target LDL less than 100 05/22/2014  ? Vitamin D deficiency 12/28/2013  ? Estrogen deficiency 12/27/2013  ? Routine general medical examination at a health care facility 12/27/2013  ? Type II diabetes  mellitus with manifestations ([place]) 08/15/2013  ? OAB (overactive bladder) 08/15/2013  ? Vitamin B12 deficiency anemia 06/05/2010  ? HYDROCEPHALUS, NORMAL PRESSURE 06/03/2010  ? Anxiety 05/15/2008  ? Essential hypertension 05/15/2008  ? ? ?Orientation RESPIRATION BLADDER Height & Weight   ?  ?Self, Time, Situation, Place ? Normal Continent Weight: 87.1 kg (per record) ?Height:  '5\' 5"'$  (165.1 cm)  ?BEHAVIORAL SYMPTOMS/MOOD NEUROLOGICAL BOWEL NUTRITION STATUS  ?    Continent Diet (regualr)  ?AMBULATORY STATUS COMMUNICATION OF NEEDS Skin   ?Independent Verbally Normal ?  ?  ?  ?    ?     ?     ? ? ?Personal Care Assistance Level of Assistance  ?Bathing, Feeding, Dressing Bathing Assistance: Independent ?Feeding assistance: Independent ?Dressing Assistance: Independent ?   ? ?Functional Limitations Info  ?Sight, Hearing, Speech Sight Info: Adequate ?Hearing Info: Adequate ?Speech Info: Adequate  ? ? ?SPECIAL CARE FACTORS FREQUENCY  ?    ?  ?  ?  ?  ?  ?  ?   ? ? ?Contractures Contractures Info: Not present  ? ? ?Additional Factors Info  ?Code Status, Allergies Code Status Info: full ?Allergies Info: Metformin And Related,Ampicillin,Onion,Ace Inhibitors,Erythromycin,Penicillins ?  ?  ?  ?   ? ?Current Medications (12/01/2021):  This is the current hospital active medication list ?Current Facility-Administered Medications  ?Medication Dose Route Frequency Provider Last Rate Last Admin  ? acetaminophen (TYLENOL) tablet 1,000 mg  1,000 mg Oral Q6H PRN Deno Etienne, DO   1,000 mg at 12/01/21 0604  ? amLODipine (NORVASC) tablet 2.5 mg  2.5 mg Oral q AM Drenda Freeze, MD   2.5 mg at 12/01/21  1046  ? ARIPiprazole (ABILIFY) tablet 2 mg  2 mg Oral Daily Drenda Freeze, MD   2 mg at 12/01/21 1046  ? darifenacin (ENABLEX) 24 hr tablet 7.5 mg  7.5 mg Oral Daily Lajean Saver, MD   7.5 mg at 12/01/21 1046  ? gabapentin (NEURONTIN) capsule 300 mg  300 mg Oral TID Lajean Saver, MD   300 mg at 12/01/21 1043  ? lidocaine (LIDODERM) 5  % 1 patch  1 patch Transdermal Q24H Kommor, [place], MD   1 patch at 11/30/21 2124  ? linaclotide (LINZESS) capsule 145 mcg  145 mcg Oral QAC breakfast Drenda Freeze, MD   145 mcg at 12/01/21 1046  ? LORazepam (ATIVAN) tablet 1 mg  1 mg Oral Q8H PRN Lajean Saver, MD   1 mg at 11/30/21 1853  ? mirabegron ER (MYRBETRIQ) tablet 25 mg  25 mg Oral QHS Lajean Saver, MD   25 mg at 11/30/21 2123  ? ondansetron (ZOFRAN-ODT) disintegrating tablet 4 mg  4 mg Oral Q8H PRN Sherwood Gambler, MD   4 mg at 11/30/21 2245  ? prazosin (MINIPRESS) capsule 2 mg  2 mg Oral QHS Drenda Freeze, MD   2 mg at 11/30/21 2122  ? venlafaxine XR (EFFEXOR-XR) 24 hr capsule 37.5 mg  37.5 mg Oral Q breakfast Lajean Saver, MD   37.5 mg at 12/01/21 1046  ? ?Current Outpatient Medications  ?Medication Sig Dispense Refill  ? acetaminophen (TYLENOL) 325 MG tablet Take 650 mg by mouth every 6 (six) hours as needed (for pain).    ? amLODipine (NORVASC) 2.5 MG tablet Take 2.5 mg by mouth in the morning. (0900)    ? ARIPiprazole (ABILIFY) 2 MG tablet Take 2 mg by mouth at bedtime. Dr. Toy Care; need correct dosage    ? bisacodyl (DULCOLAX) 10 MG suppository Place 10 mg rectally daily as needed for moderate constipation.    ? desvenlafaxine (PRISTIQ) 100 MG 24 hr tablet Take 100 mg by mouth every evening. (2100)    ? gabapentin (NEURONTIN) 300 MG capsule Take 300 mg by mouth 3 (three) times daily.    ? lidocaine (LIDODERM) 5 % Place 1 patch onto the skin daily. Remove & Discard patch within 12 hours or as directed by MD (Patient taking differently: Place 1 patch onto the skin every 12 (twelve) hours as needed (pain). Remove & Discard patch within 12 hours or as directed by MD) 30 patch 5  ? linaclotide (LINZESS) 145 MCG CAPS capsule Take 1 capsule (145 mcg total) by mouth daily before breakfast. 90 capsule 1  ? LORazepam (ATIVAN) 1 MG tablet Take 1 tablet (1 mg total) by mouth every 8 (eight) hours as needed for anxiety. 90 tablet 3  ? LORazepam  (ATIVAN) 1 MG tablet Take 1 mg by mouth every 8 (eight) hours.    ? miconazole (ANTIFUNGAL) 2 % powder Apply 1 application. topically 2 (two) times daily. Apply to breast and abdominal fold    ? mirabegron ER (MYRBETRIQ) 25 MG TB24 tablet Take 1 tablet (25 mg total) by mouth daily. (Patient taking differently: Take 25 mg by mouth at bedtime.) 90 tablet 1  ? ondansetron (ZOFRAN) 4 MG tablet Take 4 mg by mouth every 6 (six) hours as needed for nausea or vomiting.    ? pantoprazole (PROTONIX) 40 MG tablet Take 1 tablet (40 mg total) by mouth daily. (Patient taking differently: Take 40 mg by mouth in the morning. (0800)) 90 tablet 3  ? polyethylene glycol (  MIRALAX / GLYCOLAX) 17 g packet Take 17 g by mouth daily.    ? prazosin (MINIPRESS) 2 MG capsule Take 2 mg by mouth at bedtime. (2100)    ? Skin Protectants, Misc. (WHITE PETROLATUM-ZINC) 58.3 % cream Apply 1 application. topically at bedtime.    ? solifenacin (VESICARE) 5 MG tablet Take 1 tablet (5 mg total) by mouth daily. (Patient taking differently: Take 5 mg by mouth at bedtime.) 90 tablet 1  ? Tiotropium Bromide Monohydrate (SPIRIVA RESPIMAT) 2.5 MCG/ACT AERS Inhale 2 puffs into the lungs daily. (Patient taking differently: Inhale 2 puffs into the lungs daily. (0900)) 12 g 1  ? traMADol (ULTRAM) 50 MG tablet Take 50 mg by mouth 2 (two) times daily.    ? zolpidem (AMBIEN) 5 MG tablet Take 5 mg by mouth at bedtime.    ? atorvastatin (LIPITOR) 40 MG tablet Take 1 tablet (40 mg total) by mouth daily. (Patient taking differently: Take 40 mg by mouth every evening. (2100)) 90 tablet 1  ? Semaglutide (RYBELSUS) 3 MG TABS Take 3 mg by mouth daily. (Patient not taking: Reported on 11/25/2021) 30 tablet 0  ? ? ? ?Discharge Medications: ?Please see discharge summary for a list of discharge medications. ? ?Relevant Imaging Results: ? ?Relevant Lab Results: ? ? ?Additional Information ?SSN [id number] ? ?Joaquin Courts, RN ? ? ? ? ?

## 2021-12-02 DIAGNOSIS — Z743 Need for continuous supervision: Secondary | ICD-10-CM | POA: Diagnosis not present

## 2021-12-02 DIAGNOSIS — R531 Weakness: Secondary | ICD-10-CM | POA: Diagnosis not present

## 2021-12-02 MED ORDER — NITROFURANTOIN MONOHYD MACRO 100 MG PO CAPS
100.0000 mg | ORAL_CAPSULE | Freq: Two times a day (BID) | ORAL | Status: DC
  Filled 2021-12-02: qty 1

## 2021-12-02 NOTE — ED Notes (Signed)
Patient calm and cooperative. Patient took her meds last night.  ?

## 2021-12-02 NOTE — ED Provider Notes (Signed)
Emergency Medicine Observation Re-evaluation Note ? ?[name] is a 76 y.o. female, seen on rounds today.  Pt initially presented to the ED for complaints of Suicidal ?Currently, the patient is resting in bed. ? ?Physical Exam  ?BP (!) 150/69 (BP Location: Left Arm)   Pulse 92   Temp 97.8 ?F (36.6 ?C) (Oral)   Resp 20   Ht 1.651 m ('5\' 5"'$ )   Wt 87.1 kg Comment: per record  SpO2 95%   BMI 31.95 kg/m?  ?Physical Exam ? ? ?ED Course / MDM  ?EKG:  ? ?I have reviewed the labs performed to date as well as medications administered while in observation.  Recent changes in the last 24 hours include still awaiting placement.  Patient has bed offer and finances are being worked on.  Patient is urinalysis reviewed from several days ago and her culture was positive.  Placed on antibiotics for UTI.Marland Kitchen ? ?Plan  ?Current plan is for placement. ?[name] is under involuntary commitment. ?  ? ?  ?Lacretia Leigh, MD ?12/02/21 [phone] ? ?

## 2021-12-02 NOTE — Progress Notes (Signed)
Manufacturing engineer [facility]) Hospital Liaison Note ? ?ACC will continue to follow through discharge. Please call with any questions or concerns. Thank you ? ? ?Roselee Nova, LCSW ?[place] Hospital Liaison ?[phone] ?

## 2021-12-02 NOTE — Progress Notes (Signed)
CSW spoke with [name] admission from [name] she stated pt's HCPOA is at the facility signing paperwork this morning. Pt is to d/c to [name] this morning.  ? ?11:58am ?Pt room assignment 104, call report [phone]. ? ?[name].[name], MSW, [place] ?[name]  Transitions of Care ?Clinical Social Worker I ?Direct Dial: [phone]  Fax: [phone] ?[name]  ?

## 2021-12-02 NOTE — ED Notes (Signed)
Patient DC d off unit to facility per provider. Patient alert, calm, cooperative, no s/s of distress. DC information given to [facility] staff for transport.  Patient on stretcher off unit, transported by [facility].  ?

## 2021-12-09 ENCOUNTER — Ambulatory Visit: Payer: Medicare PPO | Admitting: Podiatry

## 2021-12-09 ENCOUNTER — Encounter: Payer: Self-pay | Admitting: Podiatry

## 2021-12-09 DIAGNOSIS — N3281 Overactive bladder: Secondary | ICD-10-CM | POA: Diagnosis not present

## 2021-12-09 DIAGNOSIS — M79675 Pain in left toe(s): Secondary | ICD-10-CM | POA: Diagnosis not present

## 2021-12-09 DIAGNOSIS — M79674 Pain in right toe(s): Secondary | ICD-10-CM

## 2021-12-09 DIAGNOSIS — I10 Essential (primary) hypertension: Secondary | ICD-10-CM | POA: Diagnosis not present

## 2021-12-09 DIAGNOSIS — B351 Tinea unguium: Secondary | ICD-10-CM

## 2021-12-09 DIAGNOSIS — K219 Gastro-esophageal reflux disease without esophagitis: Secondary | ICD-10-CM | POA: Diagnosis not present

## 2021-12-09 DIAGNOSIS — E119 Type 2 diabetes mellitus without complications: Secondary | ICD-10-CM

## 2021-12-09 DIAGNOSIS — F32A Depression, unspecified: Secondary | ICD-10-CM | POA: Diagnosis not present

## 2021-12-09 DIAGNOSIS — E785 Hyperlipidemia, unspecified: Secondary | ICD-10-CM | POA: Diagnosis not present

## 2021-12-09 NOTE — Progress Notes (Signed)

## 2021-12-10 ENCOUNTER — Encounter: Payer: Self-pay | Admitting: Diagnostic Neuroimaging

## 2021-12-10 DIAGNOSIS — M5412 Radiculopathy, cervical region: Secondary | ICD-10-CM | POA: Diagnosis not present

## 2021-12-10 DIAGNOSIS — J42 Unspecified chronic bronchitis: Secondary | ICD-10-CM | POA: Diagnosis not present

## 2021-12-10 DIAGNOSIS — N3281 Overactive bladder: Secondary | ICD-10-CM | POA: Diagnosis not present

## 2021-12-10 DIAGNOSIS — R45851 Suicidal ideations: Secondary | ICD-10-CM | POA: Diagnosis not present

## 2021-12-10 DIAGNOSIS — I10 Essential (primary) hypertension: Secondary | ICD-10-CM | POA: Diagnosis not present

## 2021-12-10 DIAGNOSIS — F411 Generalized anxiety disorder: Secondary | ICD-10-CM | POA: Diagnosis not present

## 2021-12-14 DIAGNOSIS — W19XXXA Unspecified fall, initial encounter: Secondary | ICD-10-CM | POA: Diagnosis not present

## 2021-12-14 DIAGNOSIS — R531 Weakness: Secondary | ICD-10-CM | POA: Diagnosis not present

## 2022-01-04 DIAGNOSIS — F411 Generalized anxiety disorder: Secondary | ICD-10-CM | POA: Diagnosis not present

## 2022-01-04 DIAGNOSIS — M5412 Radiculopathy, cervical region: Secondary | ICD-10-CM | POA: Diagnosis not present

## 2022-01-04 DIAGNOSIS — I10 Essential (primary) hypertension: Secondary | ICD-10-CM | POA: Diagnosis not present

## 2022-01-04 DIAGNOSIS — N3281 Overactive bladder: Secondary | ICD-10-CM | POA: Diagnosis not present

## 2022-01-27 DIAGNOSIS — I10 Essential (primary) hypertension: Secondary | ICD-10-CM | POA: Diagnosis not present

## 2022-01-27 DIAGNOSIS — M5412 Radiculopathy, cervical region: Secondary | ICD-10-CM | POA: Diagnosis not present

## 2022-02-08 DIAGNOSIS — M5412 Radiculopathy, cervical region: Secondary | ICD-10-CM | POA: Diagnosis not present

## 2022-02-08 DIAGNOSIS — E119 Type 2 diabetes mellitus without complications: Secondary | ICD-10-CM | POA: Diagnosis not present

## 2022-02-08 DIAGNOSIS — F411 Generalized anxiety disorder: Secondary | ICD-10-CM | POA: Diagnosis not present

## 2022-02-08 DIAGNOSIS — I10 Essential (primary) hypertension: Secondary | ICD-10-CM | POA: Diagnosis not present

## 2022-02-08 DIAGNOSIS — E785 Hyperlipidemia, unspecified: Secondary | ICD-10-CM | POA: Diagnosis not present

## 2022-03-05 ENCOUNTER — Other Ambulatory Visit (HOSPITAL_COMMUNITY): Payer: Self-pay

## 2022-03-15 ENCOUNTER — Ambulatory Visit: Payer: Medicare PPO | Admitting: Podiatry

## 2022-03-15 ENCOUNTER — Encounter: Payer: Self-pay | Admitting: Podiatry

## 2022-03-15 DIAGNOSIS — M79674 Pain in right toe(s): Secondary | ICD-10-CM

## 2022-03-15 DIAGNOSIS — B351 Tinea unguium: Secondary | ICD-10-CM

## 2022-03-15 DIAGNOSIS — M79675 Pain in left toe(s): Secondary | ICD-10-CM

## 2022-03-15 DIAGNOSIS — E119 Type 2 diabetes mellitus without complications: Secondary | ICD-10-CM

## 2022-03-15 NOTE — Progress Notes (Signed)
This patient returns to my office for at risk foot care.  This patient requires this care by a professional since this patient will be at risk due to having diabetes.    This patient is unable to cut nails herself since the patient cannot reach her nails.These nails are painful walking and wearing shoes.  This patient presents for at risk foot care today.  General Appearance  Alert, conversant and in no acute stress.  Vascular  Dorsalis pedis and posterior tibial  pulses are palpable  bilaterally.  Capillary return is within normal limits  bilaterally. Temperature is within normal limits  bilaterally.  Neurologic  Senn-Weinstein monofilament wire test within normal limits  bilaterally. Muscle power within normal limits bilaterally.  Nails Thick disfigured discolored nails with subungual debris  from hallux to fifth toes bilaterally. No evidence of bacterial infection or drainage bilaterally.  Orthopedic  No limitations of motion  feet .  No crepitus or effusions noted.  No bony pathology or digital deformities noted.  Skin  normotropic skin with no porokeratosis noted bilaterally.  No signs of infections or ulcers noted.     Onychomycosis  Pain in right toes  Pain in left toes  Consent was obtained for treatment procedures.   Mechanical debridement of nails 1-5  bilaterally performed with a nail nipper.  Filed with dremel without incident.    Return office visit    3 months                  Told patient to return for periodic foot care and evaluation due to potential at risk complications.   Gardiner Barefoot DPM

## 2022-03-16 ENCOUNTER — Ambulatory Visit: Payer: Medicare PPO | Admitting: Podiatry

## 2022-03-23 DIAGNOSIS — M25562 Pain in left knee: Secondary | ICD-10-CM | POA: Diagnosis not present

## 2022-04-12 DIAGNOSIS — F411 Generalized anxiety disorder: Secondary | ICD-10-CM | POA: Diagnosis not present

## 2022-04-28 DIAGNOSIS — R0981 Nasal congestion: Secondary | ICD-10-CM | POA: Diagnosis not present

## 2022-04-28 DIAGNOSIS — J3489 Other specified disorders of nose and nasal sinuses: Secondary | ICD-10-CM | POA: Diagnosis not present

## 2022-05-03 DIAGNOSIS — M25562 Pain in left knee: Secondary | ICD-10-CM | POA: Diagnosis not present

## 2022-05-03 DIAGNOSIS — G4700 Insomnia, unspecified: Secondary | ICD-10-CM | POA: Diagnosis not present

## 2022-05-03 DIAGNOSIS — F419 Anxiety disorder, unspecified: Secondary | ICD-10-CM | POA: Diagnosis not present

## 2022-05-07 NOTE — Telephone Encounter (Signed)
error 

## 2022-06-09 DIAGNOSIS — Z79899 Other long term (current) drug therapy: Secondary | ICD-10-CM | POA: Diagnosis not present

## 2022-06-09 DIAGNOSIS — R52 Pain, unspecified: Secondary | ICD-10-CM | POA: Diagnosis not present

## 2022-06-09 DIAGNOSIS — K219 Gastro-esophageal reflux disease without esophagitis: Secondary | ICD-10-CM | POA: Diagnosis not present

## 2022-06-09 DIAGNOSIS — Z760 Encounter for issue of repeat prescription: Secondary | ICD-10-CM | POA: Diagnosis not present

## 2022-06-09 DIAGNOSIS — E785 Hyperlipidemia, unspecified: Secondary | ICD-10-CM | POA: Diagnosis not present

## 2022-06-09 DIAGNOSIS — E119 Type 2 diabetes mellitus without complications: Secondary | ICD-10-CM | POA: Diagnosis not present

## 2022-06-16 ENCOUNTER — Other Ambulatory Visit: Payer: Self-pay

## 2022-06-16 ENCOUNTER — Emergency Department (HOSPITAL_COMMUNITY): Payer: Medicare PPO

## 2022-06-16 ENCOUNTER — Encounter (HOSPITAL_COMMUNITY): Payer: Self-pay

## 2022-06-16 ENCOUNTER — Emergency Department (HOSPITAL_COMMUNITY)
Admission: EM | Admit: 2022-06-16 | Discharge: 2022-06-16 | Disposition: A | Discharge status: Home / Self Care | Payer: Medicare PPO | Attending: Emergency Medicine | Admitting: Emergency Medicine

## 2022-06-16 DIAGNOSIS — I959 Hypotension, unspecified: Secondary | ICD-10-CM | POA: Diagnosis not present

## 2022-06-16 DIAGNOSIS — M4802 Spinal stenosis, cervical region: Secondary | ICD-10-CM | POA: Insufficient documentation

## 2022-06-16 DIAGNOSIS — Z79899 Other long term (current) drug therapy: Secondary | ICD-10-CM | POA: Insufficient documentation

## 2022-06-16 DIAGNOSIS — T68XXXA Hypothermia, initial encounter: Secondary | ICD-10-CM | POA: Diagnosis not present

## 2022-06-16 DIAGNOSIS — I639 Cerebral infarction, unspecified: Secondary | ICD-10-CM | POA: Diagnosis not present

## 2022-06-16 DIAGNOSIS — G319 Degenerative disease of nervous system, unspecified: Secondary | ICD-10-CM | POA: Diagnosis not present

## 2022-06-16 DIAGNOSIS — M50222 Other cervical disc displacement at C5-C6 level: Secondary | ICD-10-CM | POA: Diagnosis not present

## 2022-06-16 DIAGNOSIS — W06XXXA Fall from bed, initial encounter: Secondary | ICD-10-CM | POA: Diagnosis not present

## 2022-06-16 DIAGNOSIS — Z982 Presence of cerebrospinal fluid drainage device: Secondary | ICD-10-CM | POA: Diagnosis not present

## 2022-06-16 DIAGNOSIS — W19XXXA Unspecified fall, initial encounter: Secondary | ICD-10-CM

## 2022-06-16 DIAGNOSIS — S0990XA Unspecified injury of head, initial encounter: Secondary | ICD-10-CM

## 2022-06-16 DIAGNOSIS — M4322 Fusion of spine, cervical region: Secondary | ICD-10-CM | POA: Diagnosis not present

## 2022-06-16 DIAGNOSIS — S0003XA Contusion of scalp, initial encounter: Secondary | ICD-10-CM | POA: Insufficient documentation

## 2022-06-16 DIAGNOSIS — R519 Headache, unspecified: Secondary | ICD-10-CM | POA: Diagnosis not present

## 2022-06-16 DIAGNOSIS — I6782 Cerebral ischemia: Secondary | ICD-10-CM | POA: Diagnosis not present

## 2022-06-16 DIAGNOSIS — I10 Essential (primary) hypertension: Secondary | ICD-10-CM | POA: Insufficient documentation

## 2022-06-16 MED ORDER — ACETAMINOPHEN 325 MG PO TABS
650.0000 mg | ORAL_TABLET | Freq: Once | ORAL | Status: AC
  Administered 2022-06-16: 650 mg via ORAL
  Filled 2022-06-16: qty 2

## 2022-06-16 NOTE — ED Triage Notes (Signed)
Patient from [facility], was laying in bed went to reach for something and rolled out of bed. Patient hit back of head with hematoma noted.  Pt A&O x4 on arrival.  Denies LOC, blood thinner usage, neck/back pain

## 2022-06-16 NOTE — ED Notes (Signed)
Attempts to call [place] at [facility] were unanswered.  Phone message states mailbox full and I could not leave any messages.

## 2022-06-16 NOTE — ED Provider Notes (Cosign Needed Addendum)
[facility] DEPT Provider Note   CSN: [id number] Arrival date & time: 06/16/22  1505     History  Chief Complaint  Patient presents with   [name] is a 76 y.o. female.   Fall  Patient is a 76 year old female with a past medical history significant for hydrocephalus with VP shunt, depression, HTN, frequent falls, anxiety, MVP  Patient presents emergency room today brought in by EMS after minor head injury.  Seems that [name] was in her bed and reached towards a book that was on her nightstand and rolled out of the bed landing on the ground striking the back of her head against the ground.  [name] states [name] did not lose consciousness has not had any nausea or vomiting.  [name] has a mild discomfort in the back of her head but [name] states that it is not significant.  No chest pain.  [name] does endorse some shoulder pain but is able to move her arms without difficulty and states that [name] has been walking as [name] normally does.  [name] has some chronic pain in her left knee but no new injuries to her left knee.  [name] denies any confusion or slurring of her speech.     Home Medications Prior to Admission medications   Medication Sig Start Date End Date Taking? Authorizing Provider  acetaminophen (TYLENOL) 325 MG tablet Take 650 mg by mouth every 6 (six) hours as needed (for pain).    [provider]  amLODipine (NORVASC) 2.5 MG tablet Take 2.5 mg by mouth in the morning. (0900)    [provider]  ARIPiprazole (ABILIFY) 2 MG tablet Take 2 mg by mouth at bedtime. Dr. Toy Care; need correct dosage    [provider]  atorvastatin (LIPITOR) 40 MG tablet Take 1 tablet (40 mg total) by mouth daily. Patient taking differently: Take 40 mg by mouth every evening. (2100) 01/10/20   Janith Lima, MD  bisacodyl (DULCOLAX) 10 MG suppository Place 10 mg rectally daily as needed for moderate constipation.    [provider]   desvenlafaxine (PRISTIQ) 100 MG 24 hr tablet Take 100 mg by mouth every evening. (2100)    [provider]  gabapentin (NEURONTIN) 300 MG capsule Take 300 mg by mouth 3 (three) times daily. 06/19/21   [provider]  lidocaine (LIDODERM) 5 % Place 1 patch onto the skin daily. Remove & Discard patch within 12 hours or as directed by MD Patient taking differently: Place 1 patch onto the skin every 12 (twelve) hours as needed (pain). Remove & Discard patch within 12 hours or as directed by MD 04/16/19   Janith Lima, MD  linaclotide [facility]) 145 MCG CAPS capsule Take 1 capsule (145 mcg total) by mouth daily before breakfast. 11/03/20   Janith Lima, MD  LORazepam (ATIVAN) 1 MG tablet Take 1 tablet (1 mg total) by mouth every 8 (eight) hours as needed for anxiety. 04/21/21   Janith Lima, MD  LORazepam (ATIVAN) 1 MG tablet Take 1 mg by mouth every 8 (eight) hours.    [provider]  miconazole (ANTIFUNGAL) 2 % powder Apply 1 application. topically 2 (two) times daily. Apply to breast and abdominal fold    [provider]  mirabegron ER (MYRBETRIQ) 25 MG TB24 tablet Take 1 tablet (25 mg total) by mouth daily. Patient taking differently: Take 25 mg by mouth at bedtime. 04/24/19   Janith Lima, MD  ondansetron (ZOFRAN) 4 MG tablet Take 4 mg by mouth every 6 (six) hours as needed for nausea or vomiting.    [provider]  pantoprazole (PROTONIX) 40 MG tablet Take 1 tablet (40 mg total) by mouth daily. Patient taking differently: Take 40 mg by mouth in the morning. (0800) 10/26/17   Janith Lima, MD  polyethylene glycol (MIRALAX / GLYCOLAX) 17 g packet Take 17 g by mouth daily.    [provider]  prazosin (MINIPRESS) 2 MG capsule Take 2 mg by mouth at bedtime. (2100)    [provider]  Semaglutide (RYBELSUS) 3 MG TABS Take 3 mg by mouth daily. Patient not taking: Reported on 11/25/2021 04/28/21   Janith Lima, MD  Skin  Protectants, Misc. (WHITE PETROLATUM-ZINC) 58.3 % cream Apply 1 application. topically at bedtime.    [provider]  solifenacin (VESICARE) 5 MG tablet Take 1 tablet (5 mg total) by mouth daily. Patient taking differently: Take 5 mg by mouth at bedtime. 02/27/19   Janith Lima, MD  Tiotropium Bromide Monohydrate (SPIRIVA RESPIMAT) 2.5 MCG/ACT AERS Inhale 2 puffs into the lungs daily. Patient taking differently: Inhale 2 puffs into the lungs daily. (0900) 09/05/20   Janith Lima, MD  traMADol (ULTRAM) 50 MG tablet Take 50 mg by mouth 2 (two) times daily. 11/05/21   [provider]  zolpidem (AMBIEN) 5 MG tablet Take 5 mg by mouth at bedtime. 10/04/21   [provider]      Allergies    Metformin and related, Ampicillin, Metformin, Onion, Ace inhibitors, Erythromycin, Penicillins, and Topiramate    Review of Systems   Review of Systems  Physical Exam Updated Vital Signs BP (!) 140/80 (BP Location: Left Arm)   Pulse 87   Temp 99 F (37.2 C) (Oral)   Resp 16   SpO2 96%  Physical Exam Vitals and nursing note reviewed.  Constitutional:      General: [name] is not in acute distress. HENT:     Head: Normocephalic.     Comments: Contusion to posterior scalp.  Some tenderness here.  No bleeding or abrasion or laceration    Nose: Nose normal.     Mouth/Throat:     Mouth: Mucous membranes are moist.  Eyes:     General: No scleral icterus. Cardiovascular:     Rate and Rhythm: Normal rate and regular rhythm.     Pulses: Normal pulses.     Heart sounds: Normal heart sounds.  Pulmonary:     Effort: Pulmonary effort is normal. No respiratory distress.     Breath sounds: No wheezing.  Abdominal:     Palpations: Abdomen is soft.     Tenderness: There is no abdominal tenderness. There is no guarding or rebound.  Musculoskeletal:     Cervical back: Normal range of motion.     Right lower leg: No edema.     Left lower leg: No edema.     Comments: Mild diffuse  C-spine tenderness.  No focal bony tenderness.  There is some paravertebral muscular tenderness as well  Skin:    General: Skin is warm and dry.     Capillary Refill: Capillary refill takes less than 2 seconds.     Comments: No abrasions or lacerations of extremities trunk abdomen or scalp  Neurological:     Mental Status: [name] is alert. Mental status is at baseline.     Comments: VP shunt located in right occipital cranium.  Drain to neck  with palpable cord.   Sensation intact in all 4 extremities, smile symmetric, moves all 4 extremities with good coordination.  Very hard of hearing but alert and oriented x3  Psychiatric:        Mood and Affect: Mood normal.        Behavior: Behavior normal.     ED Results / Procedures / Treatments   Labs (all labs ordered are listed, but only abnormal results are displayed) Labs Reviewed - No data to display  EKG None  Radiology CT HEAD WO CONTRAST (5MM)  Result Date: 06/16/2022 CLINICAL DATA:  Fall EXAM: CT HEAD WITHOUT CONTRAST CT CERVICAL SPINE WITHOUT CONTRAST TECHNIQUE: Multidetector CT imaging of the head and cervical spine was performed following the standard protocol without intravenous contrast. Multiplanar CT image reconstructions of the cervical spine were also generated. RADIATION DOSE REDUCTION: This exam was performed according to the departmental dose-optimization program which includes automated exposure control, adjustment of the mA and/or kV according to patient size and/or use of iterative reconstruction technique. COMPARISON:  CT C spine 05/16/2019, MRI C spine 02/17/20,CT Head 03/14/21 FINDINGS: CT HEAD FINDINGS Brain: Right parietal approach ventriculostomy catheter in place with the tip near the foramen of Monro. Unchanged size and shape of the ventricular system compared to prior exam. 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 Sequela of moderate chronic microvascular ischemic change. Advanced generalized volume loss.  No hemorrhage. No extra-axial fluid collection. Vascular: Vascular calcification of the carotid siphon bilaterally. Skull: Negative for fracture or focal lesion. Sinuses/Orbits: Bilateral lens replacement. Other: None. CT CERVICAL SPINE FINDINGS Alignment: Straightening of the normal cervical lordosis. Skull base and vertebrae: No acute fracture. No primary bone lesion or focal pathologic process. Soft tissues and spinal canal: There is persistent asymmetric soft tissue thickening along the piriformis sinus on the left (series 8, image 43), not significantly changed compared to 05/16/2019. There are bilateral tonsilliths. Disc levels: Fusion of the facet joints of the C2-C3 level on the right. Moderate to severe spinal canal stenosis at the C5-C6 level secondary to a calcified disc bulge. Upper chest: Negative. Other: None IMPRESSION: 1. No acute intracranial abnormality. Right parietal approach ventriculostomy catheter in place with unchanged size and shape of the ventricular system compared to prior exam. 2. No acute cervical spine fracture. 3. Unchanged asymmetric soft tissue thickening along the piriformis sinus on the left, not significantly changed compared to 05/16/2019. 4. Moderate to severe spinal canal stenosis at the C5-C6 level secondary to a calcified disc bulge, unchanged compared to prior cervical spine MRI. Electronically Signed   By: Marin Roberts M.D.   On: 06/16/2022 16:07   CT Cervical Spine Wo Contrast  Result Date: 06/16/2022 CLINICAL DATA:  Fall EXAM: CT HEAD WITHOUT CONTRAST CT CERVICAL SPINE WITHOUT CONTRAST TECHNIQUE: Multidetector CT imaging of the head and cervical spine was performed following the standard protocol without intravenous contrast. Multiplanar CT image reconstructions of the cervical spine were also generated. RADIATION DOSE REDUCTION: This exam was performed according to the departmental dose-optimization program which includes automated exposure control, adjustment of  the mA and/or kV according to patient size and/or use of iterative reconstruction technique. COMPARISON:  CT C spine 05/16/2019, MRI C spine 02/17/20,CT Head 03/14/21 FINDINGS: CT HEAD FINDINGS Brain: Right parietal approach ventriculostomy catheter in place with the tip near the foramen of Monro. Unchanged size and shape of the ventricular system compared to prior exam. 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 Sequela of moderate chronic microvascular ischemic change. Advanced generalized  volume loss. No hemorrhage. No extra-axial fluid collection. Vascular: Vascular calcification of the carotid siphon bilaterally. Skull: Negative for fracture or focal lesion. Sinuses/Orbits: Bilateral lens replacement. Other: None. CT CERVICAL SPINE FINDINGS Alignment: Straightening of the normal cervical lordosis. Skull base and vertebrae: No acute fracture. No primary bone lesion or focal pathologic process. Soft tissues and spinal canal: There is persistent asymmetric soft tissue thickening along the piriformis sinus on the left (series 8, image 43), not significantly changed compared to 05/16/2019. There are bilateral tonsilliths. Disc levels: Fusion of the facet joints of the C2-C3 level on the right. Moderate to severe spinal canal stenosis at the C5-C6 level secondary to a calcified disc bulge. Upper chest: Negative. Other: None IMPRESSION: 1. No acute intracranial abnormality. Right parietal approach ventriculostomy catheter in place with unchanged size and shape of the ventricular system compared to prior exam. 2. No acute cervical spine fracture. 3. Unchanged asymmetric soft tissue thickening along the piriformis sinus on the left, not significantly changed compared to 05/16/2019. 4. Moderate to severe spinal canal stenosis at the C5-C6 level secondary to a calcified disc bulge, unchanged compared to prior cervical spine MRI. Electronically Signed   By: Marin Roberts M.D.   On: 06/16/2022 16:07     Procedures Procedures    Medications Ordered in ED Medications  acetaminophen (TYLENOL) tablet 650 mg (650 mg Oral Given 06/16/22 1843)    ED Course/ Medical Decision Making/ A&P                           Medical Decision Making Risk OTC drugs.    Patient is a 76 year old female with a past medical history significant for hydrocephalus with VP shunt, depression, HTN, frequent falls, anxiety, MVP  Patient presents emergency room today brought in by EMS after minor head injury.  Seems that [name] was in her bed and reached towards a book that was on her nightstand and rolled out of the bed landing on the ground striking the back of her head against the ground.  [name] states [name] did not lose consciousness has not had any nausea or vomiting.  [name] has a mild discomfort in the back of her head but [name] states that it is not significant.  No chest pain.  [name] does endorse some shoulder pain but is able to move her arms without difficulty and states that [name] has been walking as [name] normally does.  [name] has some chronic pain in her left knee but no new injuries to her left knee.  [name] denies any confusion or slurring of her speech.  Physical exam reassuring.  No focal bony tenderness.  CT head and C-spine without fracture  IMPRESSION:  1. No acute intracranial abnormality. Right parietal approach  ventriculostomy catheter in place with unchanged size and shape of  the ventricular system compared to prior exam.  2. No acute cervical spine fracture.  3. Unchanged asymmetric soft tissue thickening along the piriformis  sinus on the left, not significantly changed compared to 05/16/2019.  4. Moderate to severe spinal canal stenosis at the C5-C6 level  secondary to a calcified disc bulge, unchanged compared to prior  cervical spine MRI.      Electronically Signed    By: Marin Roberts M.D.    On: 06/16/2022 16:07   I discussed this case with my attending physician who cosigned this note  including patient's presenting symptoms, physical exam, and planned diagnostics and interventions. Attending physician stated  agreement with plan or made changes to plan which were implemented.   Patient feels reassured that [name] has no fractures or intracranial hemorrhage.  [name] is very hard of hearing but states this is baseline for her.  [name] will follow-up with her PCP.  Return precautions were discussed.  Tylenol for discomfort.   Final Clinical Impression(s) / ED Diagnoses Final diagnoses:  Fall, initial encounter  Minor head injury, initial encounter  Spinal stenosis of cervical region    Rx / DC Orders ED Discharge Orders     None         Tedd Sias, [place] 06/16/22 2029    Tedd Sias, [place] 06/16/22 2030    Godfrey Pick, MD 06/17/22 [id number]

## 2022-06-16 NOTE — Discharge Instructions (Addendum)
The CT scan of your head and neck were without any fractures or other abnormal findings.  Take Tylenol 1000 mg every 6 hours as needed for pain.  Return to the emergency room for any new or concerning symptoms

## 2022-06-16 NOTE — ED Provider Triage Note (Signed)
Emergency Medicine Provider Triage Evaluation Note  [name] , a 76 y.o. female  was evaluated in triage.  Patient is very hard of hearing.  Pt complains of minor head injury.  She states that she fell out of bed around 2:30 PM.  She has a VP shunt.  She complains of pain to the right occiput.  No neck pain.  She has had some pain in her shoulders, but is able to move well.  No confusion or vomiting.  Review of Systems  Positive: Headache Negative: Vomiting  Physical Exam  BP (!) 143/85 (BP Location: Left Arm)   Pulse 87   Temp 99.2 F (37.3 C) (Oral)   Resp 18   SpO2 96%  Gen:   Awake, no distress   Resp:  Normal effort  MSK:   Moves extremities without difficulty  Other:  VP shunt palpated from the right occiput, to the neck and upper chest, feels continuous; normal active ROM shoulders no decreased ROM  Medical Decision Making  Medically screening exam initiated at 3:19 PM.  Appropriate orders placed.  [name] was informed that the remainder of the evaluation will be completed by another provider, this initial triage assessment does not replace that evaluation, and the importance of remaining in the ED until their evaluation is complete.     Carlisle Cater, PA-C 06/16/22 [id number]

## 2022-06-30 DIAGNOSIS — F419 Anxiety disorder, unspecified: Secondary | ICD-10-CM | POA: Diagnosis not present

## 2022-06-30 DIAGNOSIS — F32A Depression, unspecified: Secondary | ICD-10-CM | POA: Diagnosis not present

## 2022-06-30 DIAGNOSIS — J42 Unspecified chronic bronchitis: Secondary | ICD-10-CM | POA: Diagnosis not present

## 2022-06-30 DIAGNOSIS — N3281 Overactive bladder: Secondary | ICD-10-CM | POA: Diagnosis not present

## 2022-06-30 DIAGNOSIS — R52 Pain, unspecified: Secondary | ICD-10-CM | POA: Diagnosis not present

## 2022-06-30 DIAGNOSIS — K219 Gastro-esophageal reflux disease without esophagitis: Secondary | ICD-10-CM | POA: Diagnosis not present

## 2022-06-30 DIAGNOSIS — G4700 Insomnia, unspecified: Secondary | ICD-10-CM | POA: Diagnosis not present

## 2022-06-30 DIAGNOSIS — G629 Polyneuropathy, unspecified: Secondary | ICD-10-CM | POA: Diagnosis not present

## 2022-07-03 DIAGNOSIS — I10 Essential (primary) hypertension: Secondary | ICD-10-CM | POA: Diagnosis not present

## 2022-07-03 DIAGNOSIS — E785 Hyperlipidemia, unspecified: Secondary | ICD-10-CM | POA: Diagnosis not present

## 2022-07-03 DIAGNOSIS — E039 Hypothyroidism, unspecified: Secondary | ICD-10-CM | POA: Diagnosis not present

## 2022-07-03 DIAGNOSIS — E119 Type 2 diabetes mellitus without complications: Secondary | ICD-10-CM | POA: Diagnosis not present

## 2022-07-03 DIAGNOSIS — E559 Vitamin D deficiency, unspecified: Secondary | ICD-10-CM | POA: Diagnosis not present

## 2022-08-30 DIAGNOSIS — F419 Anxiety disorder, unspecified: Secondary | ICD-10-CM | POA: Diagnosis not present

## 2022-08-30 DIAGNOSIS — G4700 Insomnia, unspecified: Secondary | ICD-10-CM | POA: Diagnosis not present

## 2022-08-30 DIAGNOSIS — F515 Nightmare disorder: Secondary | ICD-10-CM | POA: Diagnosis not present

## 2022-09-09 DIAGNOSIS — I10 Essential (primary) hypertension: Secondary | ICD-10-CM | POA: Diagnosis not present

## 2022-09-09 DIAGNOSIS — N3281 Overactive bladder: Secondary | ICD-10-CM | POA: Diagnosis not present

## 2022-09-09 DIAGNOSIS — R531 Weakness: Secondary | ICD-10-CM | POA: Diagnosis not present

## 2022-09-09 DIAGNOSIS — R4189 Other symptoms and signs involving cognitive functions and awareness: Secondary | ICD-10-CM | POA: Diagnosis not present

## 2022-09-09 DIAGNOSIS — K219 Gastro-esophageal reflux disease without esophagitis: Secondary | ICD-10-CM | POA: Diagnosis not present

## 2022-09-09 DIAGNOSIS — E119 Type 2 diabetes mellitus without complications: Secondary | ICD-10-CM | POA: Diagnosis not present

## 2022-09-14 DIAGNOSIS — R52 Pain, unspecified: Secondary | ICD-10-CM | POA: Diagnosis not present

## 2022-09-14 DIAGNOSIS — F515 Nightmare disorder: Secondary | ICD-10-CM | POA: Diagnosis not present

## 2022-09-14 DIAGNOSIS — G4700 Insomnia, unspecified: Secondary | ICD-10-CM | POA: Diagnosis not present

## 2022-09-22 DIAGNOSIS — M25562 Pain in left knee: Secondary | ICD-10-CM | POA: Diagnosis not present

## 2022-09-22 DIAGNOSIS — M25511 Pain in right shoulder: Secondary | ICD-10-CM | POA: Diagnosis not present

## 2022-09-28 DIAGNOSIS — F419 Anxiety disorder, unspecified: Secondary | ICD-10-CM | POA: Diagnosis not present

## 2022-09-28 DIAGNOSIS — G4700 Insomnia, unspecified: Secondary | ICD-10-CM | POA: Diagnosis not present

## 2022-09-28 DIAGNOSIS — M25562 Pain in left knee: Secondary | ICD-10-CM | POA: Diagnosis not present

## 2022-09-28 DIAGNOSIS — M25511 Pain in right shoulder: Secondary | ICD-10-CM | POA: Diagnosis not present

## 2022-10-27 ENCOUNTER — Emergency Department (HOSPITAL_COMMUNITY)
Admission: EM | Admit: 2022-10-27 | Discharge: 2022-10-27 | Disposition: A | Discharge status: Home / Self Care | Payer: Medicare PPO | Attending: Emergency Medicine | Admitting: Emergency Medicine

## 2022-10-27 ENCOUNTER — Emergency Department (HOSPITAL_COMMUNITY): Payer: Medicare PPO

## 2022-10-27 ENCOUNTER — Other Ambulatory Visit: Payer: Self-pay

## 2022-10-27 DIAGNOSIS — R531 Weakness: Secondary | ICD-10-CM | POA: Diagnosis not present

## 2022-10-27 DIAGNOSIS — S0990XA Unspecified injury of head, initial encounter: Secondary | ICD-10-CM | POA: Diagnosis not present

## 2022-10-27 DIAGNOSIS — I6381 Other cerebral infarction due to occlusion or stenosis of small artery: Secondary | ICD-10-CM | POA: Diagnosis not present

## 2022-10-27 DIAGNOSIS — W050XXA Fall from non-moving wheelchair, initial encounter: Secondary | ICD-10-CM | POA: Diagnosis not present

## 2022-10-27 DIAGNOSIS — W19XXXA Unspecified fall, initial encounter: Secondary | ICD-10-CM

## 2022-10-27 DIAGNOSIS — Z7401 Bed confinement status: Secondary | ICD-10-CM | POA: Diagnosis not present

## 2022-10-27 DIAGNOSIS — M25562 Pain in left knee: Secondary | ICD-10-CM | POA: Insufficient documentation

## 2022-10-27 MED ORDER — ACETAMINOPHEN 500 MG PO TABS
1000.0000 mg | ORAL_TABLET | Freq: Once | ORAL | Status: AC
  Administered 2022-10-27: 1000 mg via ORAL
  Filled 2022-10-27: qty 2

## 2022-10-27 MED ORDER — OXYCODONE HCL 5 MG PO TABS
5.0000 mg | ORAL_TABLET | Freq: Once | ORAL | Status: AC
  Administered 2022-10-27: 5 mg via ORAL
  Filled 2022-10-27: qty 1

## 2022-10-27 NOTE — ED Triage Notes (Signed)
Patient [name] from [facility] after she had a fall.  Activity director stated she witnessed the fall. The patient did not lock her wheel chair and when she sat down it  rolled out from under her and she landed in a seated position on the floor. Activity director stated she did not hit her head. Wolf states that she complained of no pain enroute. On arrival to the ED patient states her head hurts and her knee hurts. 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

## 2022-10-27 NOTE — Discharge Instructions (Signed)
Your CT scan did not show any concerning finding.  Please follow-up with your family doctor in the office.  Please return for worsening headache confusion or vomiting.

## 2022-10-27 NOTE — ED Notes (Signed)
Patient given a drink and sandwich bag.

## 2022-10-27 NOTE — ED Notes (Signed)
[facility] called, seventh in line.  

## 2022-10-27 NOTE — ED Notes (Signed)
Patient transported to CT 

## 2022-10-27 NOTE — ED Provider Notes (Signed)
[place] Provider Note   CSN: [id number] Arrival date & time: 10/27/22  1534     History  Chief Complaint  Patient presents with   [name]    PHI [name] is a 77 y.o. female.  77 yo F with a chief complaint of a fall.  This is reported by her nursing facility.  The patient said she slid out of her wheelchair and then struck the back of her head.  The report from the nursing facility was that she had no injury.  EMS actually told me that she did not want to be seen here and was not sure why she was brought here.  Patient tells me that she did strike her head and is concerned about that.  She has also had some left knee pain which is a chronic problem for her that she thinks is mildly worse after the fall.   Fall       Home Medications Prior to Admission medications   Medication Sig Start Date End Date Taking? Authorizing Provider  acetaminophen (TYLENOL) 325 MG tablet Take 650 mg by mouth every 6 (six) hours as needed (for pain).    [provider]  amLODipine (NORVASC) 2.5 MG tablet Take 2.5 mg by mouth in the morning. (0900)    [provider]  ARIPiprazole (ABILIFY) 2 MG tablet Take 2 mg by mouth at bedtime. Dr. Toy Care; need correct dosage    [provider]  atorvastatin (LIPITOR) 40 MG tablet Take 1 tablet (40 mg total) by mouth daily. Patient taking differently: Take 40 mg by mouth every evening. (2100) 01/10/20   Kiara Lima, MD  bisacodyl (DULCOLAX) 10 MG suppository Place 10 mg rectally daily as needed for moderate constipation.    [provider]  desvenlafaxine (PRISTIQ) 100 MG 24 hr tablet Take 100 mg by mouth every evening. (2100)    [provider]  gabapentin (NEURONTIN) 300 MG capsule Take 300 mg by mouth 3 (three) times daily. 06/19/21   [provider]  lidocaine (LIDODERM) 5 % Place 1 patch onto the skin daily. Remove & Discard patch within 12 hours or as  directed by MD Patient taking differently: Place 1 patch onto the skin every 12 (twelve) hours as needed (pain). Remove & Discard patch within 12 hours or as directed by MD 04/16/19   Kiara Lima, MD  linaclotide [facility]) 145 MCG CAPS capsule Take 1 capsule (145 mcg total) by mouth daily before breakfast. 11/03/20   Kiara Lima, MD  LORazepam (ATIVAN) 1 MG tablet Take 1 tablet (1 mg total) by mouth every 8 (eight) hours as needed for anxiety. 04/21/21   Kiara Lima, MD  LORazepam (ATIVAN) 1 MG tablet Take 1 mg by mouth every 8 (eight) hours.    [provider]  miconazole (ANTIFUNGAL) 2 % powder Apply 1 application. topically 2 (two) times daily. Apply to breast and abdominal fold    [provider]  mirabegron ER (MYRBETRIQ) 25 MG TB24 tablet Take 1 tablet (25 mg total) by mouth daily. Patient taking differently: Take 25 mg by mouth at bedtime. 04/24/19   Kiara Lima, MD  ondansetron (ZOFRAN) 4 MG tablet Take 4 mg by mouth every 6 (six) hours as needed for nausea or vomiting.    [provider]  pantoprazole (PROTONIX) 40 MG tablet Take 1 tablet (40 mg total) by mouth daily. Patient taking differently: Take 40 mg by mouth in  the morning. (0800) 10/26/17   Kiara Lima, MD  polyethylene glycol (MIRALAX / GLYCOLAX) 17 g packet Take 17 g by mouth daily.    [provider]  prazosin (MINIPRESS) 2 MG capsule Take 2 mg by mouth at bedtime. (2100)    [provider]  Semaglutide (RYBELSUS) 3 MG TABS Take 3 mg by mouth daily. Patient not taking: Reported on 11/25/2021 04/28/21   Kiara Lima, MD  Skin Protectants, Misc. (WHITE PETROLATUM-ZINC) 58.3 % cream Apply 1 application. topically at bedtime.    [provider]  solifenacin (VESICARE) 5 MG tablet Take 1 tablet (5 mg total) by mouth daily. Patient taking differently: Take 5 mg by mouth at bedtime. 02/27/19   Kiara Lima, MD  Tiotropium Bromide Monohydrate (SPIRIVA RESPIMAT) 2.5  MCG/ACT AERS Inhale 2 puffs into the lungs daily. Patient taking differently: Inhale 2 puffs into the lungs daily. (0900) 09/05/20   Kiara Lima, MD  traMADol (ULTRAM) 50 MG tablet Take 50 mg by mouth 2 (two) times daily. 11/05/21   [provider]  zolpidem (AMBIEN) 5 MG tablet Take 5 mg by mouth at bedtime. 10/04/21   [provider]      Allergies    Metformin and related, Ampicillin, Metformin, Onion, Ace inhibitors, Erythromycin, Penicillins, and Topiramate    Review of Systems   Review of Systems  Physical Exam Updated Vital Signs BP (!) 140/102   Pulse 68   Temp 98.4 F (36.9 C) (Oral)   Resp 18   Ht 5\' 5"  (1.651 m)   Wt 88 kg   SpO2 96%   BMI 32.28 kg/m  Physical Exam Vitals and nursing note reviewed.  Constitutional:      General: She is not in acute distress.    Appearance: She is well-developed. She is not diaphoretic.  HENT:     Head: Normocephalic and atraumatic.  Eyes:     Pupils: Pupils are equal, round, and reactive to light.  Cardiovascular:     Rate and Rhythm: Normal rate and regular rhythm.     Heart sounds: No murmur heard.    No friction rub. No gallop.  Pulmonary:     Effort: Pulmonary effort is normal.     Breath sounds: No wheezing or rales.  Abdominal:     General: There is no distension.     Palpations: Abdomen is soft.     Tenderness: There is no abdominal tenderness.  Musculoskeletal:        General: No tenderness.     Cervical back: Normal range of motion and neck supple.     Comments: Palpated from head to toe without any appreciable bony tenderness.  No midline spinal tenderness step-offs or deformities.  Skin:    General: Skin is warm and dry.  Neurological:     Mental Status: She is alert and oriented to person, place, and time.  Psychiatric:        Behavior: Behavior normal.     ED Results / Procedures / Treatments   Labs (all labs ordered are listed, but only abnormal results are displayed) Labs Reviewed -  No data to display  EKG None  Radiology CT Head Wo Contrast  Result Date: 10/27/2022 CLINICAL DATA:  Witnessed fall. EXAM: CT HEAD WITHOUT CONTRAST TECHNIQUE: Contiguous axial images were obtained from the base of the skull through the vertex without intravenous contrast. RADIATION DOSE REDUCTION: This exam was performed according to the departmental dose-optimization program which includes automated exposure  control, adjustment of the mA and/or kV according to patient size and/or use of iterative reconstruction technique. COMPARISON:  06/16/2022. FINDINGS: Brain: No evidence of acute infarction, hemorrhage, hydrocephalus, extra-axial collection or mass lesion/mass effect. Right posterior parietal ventriculostomy catheter, tip projecting just above the third ventricle, unchanged from the prior CT. Old right thalamic lacunar infarct. Mild patchy periventricular white matter hypoattenuation consistent with chronic microvascular ischemic change. These findings are also stable. Vascular: No hyperdense vessel or unexpected calcification. Skull: Normal. Negative for fracture or focal lesion. Sinuses/Orbits: Globes and orbits are unremarkable. Sinuses are clear. Other: None. IMPRESSION: 1. No acute intracranial abnormalities. 2. Stable right posterior parietal ventriculostomy. Electronically Signed   By: Lajean Manes M.D.   On: 10/27/2022 17:04    Procedures Procedures    Medications Ordered in ED Medications  acetaminophen (TYLENOL) tablet 1,000 mg (1,000 mg Oral Given 10/27/22 1556)  oxyCODONE (Oxy IR/ROXICODONE) immediate release tablet 5 mg (5 mg Oral Given 10/27/22 1556)    ED Course/ Medical Decision Making/ A&P                             Medical Decision Making Amount and/or Complexity of Data Reviewed Radiology: ordered.  Risk OTC drugs. Prescription drug management.   77 yo F with a chief complaints of fall.  Patient tells me that she slipped out of her wheelchair and struck the  back of her head.  She has no signs of trauma.  She is complaining of some left knee pain that she tells me is chronic.  I do not appreciate any obvious bony tenderness to the left knee.  Full range of motion of the leg.  Will obtain a CT of the head.  CT of the head negative for acute intracranial hemorrhage on my independent interpretation.  Patient continues to be well.  Will discharge home.  PCP follow-up.  5:14 PM:  I have discussed the diagnosis/risks/treatment options with the patient.  Evaluation and diagnostic testing in the emergency department does not suggest an emergent condition requiring admission or immediate intervention beyond what has been performed at this time.  They will follow up with PCP. We also discussed returning to the ED immediately if new or worsening sx occur. We discussed the sx which are most concerning (e.g., sudden worsening pain, fever, inability to tolerate by mouth) that necessitate immediate return. Medications administered to the patient during their visit and any new prescriptions provided to the patient are listed below.  Medications given during this visit Medications  acetaminophen (TYLENOL) tablet 1,000 mg (1,000 mg Oral Given 10/27/22 1556)  oxyCODONE (Oxy IR/ROXICODONE) immediate release tablet 5 mg (5 mg Oral Given 10/27/22 1556)     The patient appears reasonably screen and/or stabilized for discharge and I doubt any other medical condition or other [facility] requiring further screening, evaluation, or treatment in the ED at this time prior to discharge.          Final Clinical Impression(s) / ED Diagnoses Final diagnoses:  Fall, initial encounter    Rx / DC Orders ED Discharge Orders     None         Deno Etienne, DO 10/27/22 1714

## 2022-10-27 NOTE — ED Notes (Signed)
Attempted to call facility twice.

## 2022-10-27 NOTE — ED Notes (Signed)
Patient returned from CT

## 2022-10-29 DIAGNOSIS — F419 Anxiety disorder, unspecified: Secondary | ICD-10-CM | POA: Diagnosis not present

## 2022-10-29 DIAGNOSIS — G4700 Insomnia, unspecified: Secondary | ICD-10-CM | POA: Diagnosis not present

## 2022-10-29 DIAGNOSIS — W19XXXA Unspecified fall, initial encounter: Secondary | ICD-10-CM | POA: Diagnosis not present

## 2022-11-01 ENCOUNTER — Telehealth: Payer: Self-pay

## 2022-11-01 NOTE — Telephone Encounter (Signed)
        Patient  visited [place] on 3/27     Telephone encounter attempt :  1st  A HIPAA compliant voice message was left requesting a return call.  Instructed patient to call back .    Annelisa Ryback [facility], [place] [phone] [address], [place],  [address] Phone: [phone] Email: [email or web address]       

## 2022-11-01 NOTE — Telephone Encounter (Signed)
     Patient  visit on 3/27  at [facility]    Have you been able to follow up with your primary care physician? Yes   The patient was or was not able to obtain any needed medicine or equipment. Yes   Are there diet recommendations that you are having difficulty following? Na   Patient expresses understanding of discharge instructions and education provided has no other needs at this time.  Yes    [place] [phone] [address], [place], [place] [address] Phone: [phone] Email: [email or web address]

## 2022-11-02 DIAGNOSIS — F515 Nightmare disorder: Secondary | ICD-10-CM | POA: Diagnosis not present

## 2022-11-02 DIAGNOSIS — F32A Depression, unspecified: Secondary | ICD-10-CM | POA: Diagnosis not present

## 2022-11-02 DIAGNOSIS — G4700 Insomnia, unspecified: Secondary | ICD-10-CM | POA: Diagnosis not present

## 2022-11-02 DIAGNOSIS — F419 Anxiety disorder, unspecified: Secondary | ICD-10-CM | POA: Diagnosis not present

## 2022-11-04 DIAGNOSIS — U071 COVID-19: Secondary | ICD-10-CM | POA: Diagnosis not present

## 2022-11-10 DIAGNOSIS — U071 COVID-19: Secondary | ICD-10-CM | POA: Diagnosis not present

## 2022-11-16 DIAGNOSIS — F515 Nightmare disorder: Secondary | ICD-10-CM | POA: Diagnosis not present

## 2022-11-16 DIAGNOSIS — F411 Generalized anxiety disorder: Secondary | ICD-10-CM | POA: Diagnosis not present

## 2022-11-16 DIAGNOSIS — F329 Major depressive disorder, single episode, unspecified: Secondary | ICD-10-CM | POA: Diagnosis not present

## 2022-11-23 DIAGNOSIS — E119 Type 2 diabetes mellitus without complications: Secondary | ICD-10-CM | POA: Diagnosis not present

## 2022-11-26 DIAGNOSIS — R300 Dysuria: Secondary | ICD-10-CM | POA: Diagnosis not present

## 2022-11-26 DIAGNOSIS — R7303 Prediabetes: Secondary | ICD-10-CM | POA: Diagnosis not present

## 2022-11-30 DIAGNOSIS — F419 Anxiety disorder, unspecified: Secondary | ICD-10-CM | POA: Diagnosis not present

## 2022-11-30 DIAGNOSIS — G4700 Insomnia, unspecified: Secondary | ICD-10-CM | POA: Diagnosis not present

## 2022-12-17 DIAGNOSIS — E669 Obesity, unspecified: Secondary | ICD-10-CM | POA: Diagnosis not present

## 2022-12-17 DIAGNOSIS — I10 Essential (primary) hypertension: Secondary | ICD-10-CM | POA: Diagnosis not present

## 2022-12-17 DIAGNOSIS — M25562 Pain in left knee: Secondary | ICD-10-CM | POA: Diagnosis not present

## 2022-12-17 DIAGNOSIS — E119 Type 2 diabetes mellitus without complications: Secondary | ICD-10-CM | POA: Diagnosis not present

## 2022-12-28 DIAGNOSIS — F419 Anxiety disorder, unspecified: Secondary | ICD-10-CM | POA: Diagnosis not present

## 2023-02-02 ENCOUNTER — Inpatient Hospital Stay (HOSPITAL_COMMUNITY): Payer: Medicare (Managed Care)

## 2023-02-02 ENCOUNTER — Emergency Department (HOSPITAL_COMMUNITY): Payer: Medicare (Managed Care)

## 2023-02-02 ENCOUNTER — Other Ambulatory Visit: Payer: Self-pay

## 2023-02-02 ENCOUNTER — Inpatient Hospital Stay (HOSPITAL_COMMUNITY)
Admission: EM | Admit: 2023-02-02 | Discharge: 2023-02-05 | DRG: 689 | Disposition: A | Discharge status: Skilled Nursing Facility | Payer: Medicare (Managed Care) | Source: Skilled Nursing Facility | Attending: Internal Medicine | Admitting: Internal Medicine

## 2023-02-02 ENCOUNTER — Encounter (HOSPITAL_COMMUNITY): Payer: Self-pay

## 2023-02-02 DIAGNOSIS — E119 Type 2 diabetes mellitus without complications: Secondary | ICD-10-CM

## 2023-02-02 DIAGNOSIS — Z982 Presence of cerebrospinal fluid drainage device: Secondary | ICD-10-CM

## 2023-02-02 DIAGNOSIS — K7581 Nonalcoholic steatohepatitis (NASH): Secondary | ICD-10-CM | POA: Diagnosis present

## 2023-02-02 DIAGNOSIS — W19XXXA Unspecified fall, initial encounter: Principal | ICD-10-CM

## 2023-02-02 DIAGNOSIS — R7401 Elevation of levels of liver transaminase levels: Secondary | ICD-10-CM

## 2023-02-02 DIAGNOSIS — R7881 Bacteremia: Secondary | ICD-10-CM | POA: Diagnosis present

## 2023-02-02 DIAGNOSIS — G319 Degenerative disease of nervous system, unspecified: Secondary | ICD-10-CM

## 2023-02-02 DIAGNOSIS — F419 Anxiety disorder, unspecified: Secondary | ICD-10-CM | POA: Diagnosis present

## 2023-02-02 DIAGNOSIS — R410 Disorientation, unspecified: Secondary | ICD-10-CM

## 2023-02-02 DIAGNOSIS — N390 Urinary tract infection, site not specified: Secondary | ICD-10-CM | POA: Diagnosis present

## 2023-02-02 DIAGNOSIS — G912 (Idiopathic) normal pressure hydrocephalus: Secondary | ICD-10-CM | POA: Diagnosis present

## 2023-02-02 DIAGNOSIS — E538 Deficiency of other specified B group vitamins: Secondary | ICD-10-CM | POA: Diagnosis present

## 2023-02-02 DIAGNOSIS — G9341 Metabolic encephalopathy: Secondary | ICD-10-CM | POA: Diagnosis present

## 2023-02-02 DIAGNOSIS — G8929 Other chronic pain: Secondary | ICD-10-CM | POA: Diagnosis present

## 2023-02-02 DIAGNOSIS — R569 Unspecified convulsions: Secondary | ICD-10-CM | POA: Diagnosis not present

## 2023-02-02 DIAGNOSIS — Z79899 Other long term (current) drug therapy: Secondary | ICD-10-CM

## 2023-02-02 DIAGNOSIS — F0394 Unspecified dementia, unspecified severity, with anxiety: Secondary | ICD-10-CM | POA: Diagnosis present

## 2023-02-02 DIAGNOSIS — B961 Klebsiella pneumoniae [K. pneumoniae] as the cause of diseases classified elsewhere: Secondary | ICD-10-CM | POA: Diagnosis present

## 2023-02-02 DIAGNOSIS — F0393 Unspecified dementia, unspecified severity, with mood disturbance: Secondary | ICD-10-CM | POA: Diagnosis present

## 2023-02-02 DIAGNOSIS — Z66 Do not resuscitate: Secondary | ICD-10-CM | POA: Diagnosis present

## 2023-02-02 DIAGNOSIS — N3281 Overactive bladder: Secondary | ICD-10-CM | POA: Diagnosis present

## 2023-02-02 DIAGNOSIS — Y92129 Unspecified place in nursing home as the place of occurrence of the external cause: Secondary | ICD-10-CM | POA: Diagnosis not present

## 2023-02-02 DIAGNOSIS — F32A Depression, unspecified: Secondary | ICD-10-CM | POA: Diagnosis present

## 2023-02-02 DIAGNOSIS — E669 Obesity, unspecified: Secondary | ICD-10-CM | POA: Diagnosis present

## 2023-02-02 DIAGNOSIS — Z888 Allergy status to other drugs, medicaments and biological substances status: Secondary | ICD-10-CM

## 2023-02-02 DIAGNOSIS — K7460 Unspecified cirrhosis of liver: Secondary | ICD-10-CM | POA: Diagnosis present

## 2023-02-02 DIAGNOSIS — E876 Hypokalemia: Secondary | ICD-10-CM | POA: Diagnosis present

## 2023-02-02 DIAGNOSIS — R4182 Altered mental status, unspecified: Secondary | ICD-10-CM | POA: Diagnosis not present

## 2023-02-02 DIAGNOSIS — E785 Hyperlipidemia, unspecified: Secondary | ICD-10-CM | POA: Diagnosis present

## 2023-02-02 DIAGNOSIS — I10 Essential (primary) hypertension: Secondary | ICD-10-CM | POA: Diagnosis present

## 2023-02-02 DIAGNOSIS — Z880 Allergy status to penicillin: Secondary | ICD-10-CM | POA: Diagnosis not present

## 2023-02-02 DIAGNOSIS — B9689 Other specified bacterial agents as the cause of diseases classified elsewhere: Secondary | ICD-10-CM | POA: Diagnosis present

## 2023-02-02 DIAGNOSIS — Z6832 Body mass index (BMI) 32.0-32.9, adult: Secondary | ICD-10-CM | POA: Diagnosis not present

## 2023-02-02 HISTORY — DX: Unspecified dementia, unspecified severity, without behavioral disturbance, psychotic disturbance, mood disturbance, and anxiety: F03.90

## 2023-02-02 HISTORY — DX: Essential (primary) hypertension: I10

## 2023-02-02 HISTORY — DX: Unspecified osteoarthritis, unspecified site: M19.90

## 2023-02-02 HISTORY — DX: Hydrocephalus, unspecified: G91.9

## 2023-02-02 HISTORY — DX: Vitamin D deficiency, unspecified: E55.9

## 2023-02-02 HISTORY — DX: Deficiency of other specified B group vitamins: E53.8

## 2023-02-02 HISTORY — DX: Suicidal ideations: R45.851

## 2023-02-02 HISTORY — DX: Radiculopathy, site unspecified: M54.10

## 2023-02-02 HISTORY — DX: Insomnia, unspecified: G47.00

## 2023-02-02 HISTORY — DX: Anxiety disorder, unspecified: F41.9

## 2023-02-02 HISTORY — DX: Hyperlipidemia, unspecified: E78.5

## 2023-02-02 HISTORY — DX: Overactive bladder: N32.81

## 2023-02-02 HISTORY — DX: Type 2 diabetes mellitus without complications: E11.9

## 2023-02-02 LAB — CBC
HCT: 42.8 % (ref 36.0–46.0)
Hemoglobin: 15 g/dL (ref 12.0–15.0)
MCH: 32 pg (ref 26.0–34.0)
MCHC: 35 g/dL (ref 30.0–36.0)
MCV: 91.3 fL (ref 80.0–100.0)
Platelets: 167 10*3/uL (ref 150–400)
RBC: 4.69 MIL/uL (ref 3.87–5.11)
RDW: 12.2 % (ref 11.5–15.5)
WBC: 14.6 10*3/uL — ABNORMAL HIGH (ref 4.0–10.5)
nRBC: 0 % (ref 0.0–0.2)

## 2023-02-02 LAB — COMPREHENSIVE METABOLIC PANEL
ALT: 137 U/L — ABNORMAL HIGH (ref 0–44)
AST: 195 U/L — ABNORMAL HIGH (ref 15–41)
Albumin: 3.3 g/dL — ABNORMAL LOW (ref 3.5–5.0)
Alkaline Phosphatase: 110 U/L (ref 38–126)
Anion gap: 12 (ref 5–15)
BUN: 9 mg/dL (ref 8–23)
CO2: 24 mmol/L (ref 22–32)
Calcium: 9.1 mg/dL (ref 8.9–10.3)
Chloride: 101 mmol/L (ref 98–111)
Creatinine, Ser: 0.94 mg/dL (ref 0.44–1.00)
GFR, Estimated: >60 mL/min (ref >60)
Glucose, Bld: 230 mg/dL — ABNORMAL HIGH (ref 70–99)
Potassium: 2.9 mmol/L — ABNORMAL LOW (ref 3.5–5.1)
Sodium: 137 mmol/L (ref 135–145)
Total Bilirubin: 6.5 mg/dL — ABNORMAL HIGH (ref 0.3–1.2)
Total Protein: 7 g/dL (ref 6.5–8.1)

## 2023-02-02 LAB — CBC WITH DIFFERENTIAL/PLATELET
Abs Immature Granulocytes: 0.02 10*3/uL (ref 0.00–0.07)
Basophils Absolute: 0 10*3/uL (ref 0.0–0.1)
Basophils Relative: 0 %
Eosinophils Absolute: 0 10*3/uL (ref 0.0–0.5)
Eosinophils Relative: 0 %
HCT: 44.2 % (ref 36.0–46.0)
Hemoglobin: 15.7 g/dL — ABNORMAL HIGH (ref 12.0–15.0)
Immature Granulocytes: 0 %
Lymphocytes Relative: 3 %
Lymphs Abs: 0.2 10*3/uL — ABNORMAL LOW (ref 0.7–4.0)
MCH: 32.1 pg (ref 26.0–34.0)
MCHC: 35.5 g/dL (ref 30.0–36.0)
MCV: 90.4 fL (ref 80.0–100.0)
Monocytes Absolute: 0.3 10*3/uL (ref 0.1–1.0)
Monocytes Relative: 3 %
Neutro Abs: 7.6 10*3/uL (ref 1.7–7.7)
Neutrophils Relative %: 94 %
Platelets: 140 10*3/uL — ABNORMAL LOW (ref 150–400)
RBC: 4.89 MIL/uL (ref 3.87–5.11)
RDW: 11.9 % (ref 11.5–15.5)
WBC: 8.1 10*3/uL (ref 4.0–10.5)
nRBC: 0 % (ref 0.0–0.2)

## 2023-02-02 LAB — TSH: TSH: 0.802 u[IU]/mL (ref 0.350–4.500)

## 2023-02-02 LAB — URINALYSIS, ROUTINE W REFLEX MICROSCOPIC
Bilirubin Urine: NEGATIVE
Glucose, UA: 50 mg/dL — AB
Ketones, ur: NEGATIVE mg/dL
Leukocytes,Ua: NEGATIVE
Nitrite: NEGATIVE
Protein, ur: NEGATIVE mg/dL
Specific Gravity, Urine: 1.012 (ref 1.005–1.030)
pH: 6 (ref 5.0–8.0)

## 2023-02-02 LAB — CREATININE, SERUM
Creatinine, Ser: 0.84 mg/dL (ref 0.44–1.00)
GFR, Estimated: >60 mL/min (ref >60)

## 2023-02-02 LAB — ACETAMINOPHEN LEVEL: Acetaminophen (Tylenol), Serum: <10 ug/mL — ABNORMAL LOW (ref 10–30)

## 2023-02-02 LAB — CBG MONITORING, ED: Glucose-Capillary: 229 mg/dL — ABNORMAL HIGH (ref 70–99)

## 2023-02-02 LAB — GLUCOSE, CAPILLARY: Glucose-Capillary: 163 mg/dL — ABNORMAL HIGH (ref 70–99)

## 2023-02-02 LAB — FOLATE: Folate: 9.5 ng/mL (ref >5.9)

## 2023-02-02 LAB — HEMOGLOBIN A1C
Hgb A1c MFr Bld: 6.3 % — ABNORMAL HIGH (ref 4.8–5.6)
Mean Plasma Glucose: 134.11 mg/dL

## 2023-02-02 LAB — MRSA NEXT GEN BY PCR, NASAL: MRSA by PCR Next Gen: NOT DETECTED

## 2023-02-02 LAB — C-REACTIVE PROTEIN: CRP: 5.3 mg/dL — ABNORMAL HIGH (ref <1.0)

## 2023-02-02 LAB — PROCALCITONIN: Procalcitonin: 12.22 ng/mL

## 2023-02-02 LAB — AMMONIA: Ammonia: 25 umol/L (ref 9–35)

## 2023-02-02 LAB — VITAMIN B12: Vitamin B-12: 178 pg/mL — ABNORMAL LOW (ref 180–914)

## 2023-02-02 LAB — SALICYLATE LEVEL: Salicylate Lvl: <7 mg/dL — ABNORMAL LOW (ref 7.0–30.0)

## 2023-02-02 MED ORDER — POTASSIUM CHLORIDE CRYS ER 20 MEQ PO TBCR
40.0000 meq | EXTENDED_RELEASE_TABLET | Freq: Once | ORAL | Status: AC
  Administered 2023-02-02: 40 meq via ORAL
  Filled 2023-02-02: qty 2

## 2023-02-02 MED ORDER — PANTOPRAZOLE SODIUM 40 MG PO TBEC
40.0000 mg | DELAYED_RELEASE_TABLET | Freq: Every day | ORAL | Status: DC
  Administered 2023-02-03 – 2023-02-05 (×3): 40 mg via ORAL
  Filled 2023-02-02 (×3): qty 1

## 2023-02-02 MED ORDER — GABAPENTIN 100 MG PO CAPS
100.0000 mg | ORAL_CAPSULE | Freq: Three times a day (TID) | ORAL | Status: DC
  Administered 2023-02-02 – 2023-02-05 (×9): 100 mg via ORAL
  Filled 2023-02-02 (×9): qty 1

## 2023-02-02 MED ORDER — LACTATED RINGERS IV BOLUS
1000.0000 mL | Freq: Once | INTRAVENOUS | Status: AC
  Administered 2023-02-02: 1000 mL via INTRAVENOUS

## 2023-02-02 MED ORDER — POTASSIUM CHLORIDE 10 MEQ/100ML IV SOLN
10.0000 meq | INTRAVENOUS | Status: AC
  Administered 2023-02-02 (×2): 10 meq via INTRAVENOUS
  Filled 2023-02-02 (×3): qty 100

## 2023-02-02 MED ORDER — LIDOCAINE 5 % EX OINT
1.0000 | TOPICAL_OINTMENT | Freq: Every day | CUTANEOUS | Status: DC
  Administered 2023-02-02 – 2023-02-05 (×4): 1 via TOPICAL
  Filled 2023-02-02 (×3): qty 35.44

## 2023-02-02 MED ORDER — LORAZEPAM 2 MG/ML IJ SOLN
1.0000 mg | Freq: Once | INTRAMUSCULAR | Status: AC
  Administered 2023-02-02: 1 mg via INTRAVENOUS
  Filled 2023-02-02: qty 1

## 2023-02-02 MED ORDER — ORAL CARE MOUTH RINSE: 15.0000 mL | OROMUCOSAL | Status: DC | PRN

## 2023-02-02 MED ORDER — INSULIN ASPART 100 UNIT/ML IJ SOLN: 0.0000 [IU] | Freq: Every day | INTRAMUSCULAR | Status: DC

## 2023-02-02 MED ORDER — ONDANSETRON HCL 4 MG/2ML IJ SOLN: 4.0000 mg | Freq: Four times a day (QID) | INTRAMUSCULAR | Status: DC | PRN

## 2023-02-02 MED ORDER — ACETAMINOPHEN 650 MG RE SUPP: 650.0000 mg | Freq: Four times a day (QID) | RECTAL | Status: DC | PRN

## 2023-02-02 MED ORDER — LORAZEPAM 1 MG PO TABS
1.0000 mg | ORAL_TABLET | Freq: Three times a day (TID) | ORAL | Status: DC
  Administered 2023-02-02 – 2023-02-05 (×10): 1 mg via ORAL
  Filled 2023-02-02 (×9): qty 1

## 2023-02-02 MED ORDER — ENOXAPARIN SODIUM 40 MG/0.4ML IJ SOSY
40.0000 mg | PREFILLED_SYRINGE | INTRAMUSCULAR | Status: DC
  Administered 2023-02-02 – 2023-02-03 (×2): 40 mg via SUBCUTANEOUS
  Filled 2023-02-02 (×2): qty 0.4

## 2023-02-02 MED ORDER — ARIPIPRAZOLE 2 MG PO TABS
2.0000 mg | ORAL_TABLET | Freq: Every day | ORAL | Status: DC
  Administered 2023-02-02 – 2023-02-04 (×3): 2 mg via ORAL
  Filled 2023-02-02 (×4): qty 1

## 2023-02-02 MED ORDER — ONDANSETRON HCL 4 MG PO TABS: 4.0000 mg | ORAL_TABLET | Freq: Four times a day (QID) | ORAL | Status: DC | PRN

## 2023-02-02 MED ORDER — LORAZEPAM 2 MG/ML PO CONC: 1.0000 mg | Freq: Once | ORAL | Status: DC

## 2023-02-02 MED ORDER — INSULIN ASPART 100 UNIT/ML IJ SOLN
0.0000 [IU] | Freq: Three times a day (TID) | INTRAMUSCULAR | Status: DC
  Administered 2023-02-03 – 2023-02-04 (×3): 1 [IU] via SUBCUTANEOUS

## 2023-02-02 MED ORDER — DICLOFENAC SODIUM 1 % EX GEL
4.0000 g | Freq: Four times a day (QID) | CUTANEOUS | Status: DC
  Administered 2023-02-02 – 2023-02-05 (×12): 4 g via TOPICAL
  Filled 2023-02-02: qty 100

## 2023-02-02 MED ORDER — ACETAMINOPHEN 325 MG PO TABS
650.0000 mg | ORAL_TABLET | Freq: Four times a day (QID) | ORAL | Status: DC | PRN
  Administered 2023-02-02 – 2023-02-05 (×2): 650 mg via ORAL
  Filled 2023-02-02 (×2): qty 2

## 2023-02-02 MED ORDER — SODIUM CHLORIDE 0.9% FLUSH
3.0000 mL | Freq: Two times a day (BID) | INTRAVENOUS | Status: DC
  Administered 2023-02-02 – 2023-02-05 (×6): 3 mL via INTRAVENOUS

## 2023-02-02 MED ORDER — LORAZEPAM 1 MG PO TABS
1.0000 mg | ORAL_TABLET | Freq: Once | ORAL | Status: DC
  Filled 2023-02-02: qty 1

## 2023-02-02 MED ORDER — PRAZOSIN HCL 2 MG PO CAPS
2.0000 mg | ORAL_CAPSULE | Freq: Every day | ORAL | Status: DC
  Administered 2023-02-02 – 2023-02-04 (×3): 2 mg via ORAL
  Filled 2023-02-02 (×4): qty 1

## 2023-02-02 MED ORDER — POTASSIUM CHLORIDE 10 MEQ/100ML IV SOLN: 10.0000 meq | INTRAVENOUS | Status: DC

## 2023-02-02 MED ORDER — VENLAFAXINE HCL ER 75 MG PO CP24
75.0000 mg | ORAL_CAPSULE | Freq: Every day | ORAL | Status: DC
  Administered 2023-02-03 – 2023-02-05 (×3): 75 mg via ORAL
  Filled 2023-02-02 (×3): qty 1

## 2023-02-02 MED ORDER — ZOLPIDEM TARTRATE 5 MG PO TABS
5.0000 mg | ORAL_TABLET | Freq: Every day | ORAL | Status: DC
  Administered 2023-02-02 – 2023-02-04 (×3): 5 mg via ORAL
  Filled 2023-02-02 (×3): qty 1

## 2023-02-02 NOTE — ED Provider Notes (Signed)
[place] EMERGENCY DEPARTMENT AT [facility] Provider Note   CSN: [id number] Arrival date & time: 02/02/23  1010     History  Chief Complaint  Patient presents with   Marletta Lor    [name] is a 77 y.o. female.  77 year old female with a history of hydrocephalus status post VP shunt, dementia, hypertension, and hyperlipidemia who presents emergency department after a fall and altered mental status.  Patient had a fall at [facility].  Afterwards staff says that she was not behaving herself and was brought to the emergency department for evaluation.  Patient unable to provide additional history.  Not on any blood thinners.  Per staff she had a fall this morning that was unwitnessed. Was found sitting on her buttocks with her head on the bed. Had some vomiting afterwards that appeared to be coffee ground emesis. Clammy and tachycardic. At baseline can carry on a conversation and is verbal. Unsure of LKW. Typically is AAOx3. Not sure where she is at all the time. No reports of ams yesterday.   Discussed with the patient's friend and healthcare power of attorney Edrick Oh.  States that she is DNR/DNI at this time.  Says that the patient was previously on hospice due to her worsening neurologic function but since getting to [facility] is improved and is now off hospice.  Typically is conversant but occasionally confused.  No history of seizures.  Not on blood thinners.  No recent illnesses otherwise.        Home Medications Prior to Admission medications   Medication Sig Start Date End Date Taking? Authorizing Provider  acetaminophen (TYLENOL) 325 MG tablet Take 650 mg by mouth every 6 (six) hours as needed for mild pain, moderate pain or fever.   Yes [provider]  ARIPiprazole (ABILIFY) 2 MG tablet Take 2 mg by mouth at bedtime.   Yes [provider]  atorvastatin (LIPITOR) 10 MG tablet Take 10 mg by mouth daily.   Yes [provider]   Camphor-Menthol-Methyl Sal (SALONPAS) 3.08-07-08 % PTCH Apply 1 Application topically daily. Apply to both knees topically.   Yes [provider]  desvenlafaxine (PRISTIQ) 50 MG 24 hr tablet Take 50 mg by mouth daily.   Yes [provider]  diclofenac Sodium (VOLTAREN ARTHRITIS PAIN) 1 % GEL Apply 2-4 g topically in the morning, at noon, and at bedtime. Apply 2g to right shoulder and 4g to left knee.   Yes [provider]  gabapentin (NEURONTIN) 100 MG capsule Take 100 mg by mouth 3 (three) times daily.   Yes [provider]  lidocaine (XYLOCAINE) 5 % ointment Apply 1 Application topically daily. Take off after 12 hours.   Yes [provider]  LORazepam (ATIVAN) 1 MG tablet Take 1 mg by mouth in the morning, at noon, and at bedtime.   Yes [provider]  nystatin (MYCOSTATIN/NYSTOP) powder Apply 1 Application topically every 12 (twelve) hours as needed (Rash).   Yes [provider]  pantoprazole (PROTONIX) 40 MG tablet Take 40 mg by mouth daily.   Yes [provider]  prazosin (MINIPRESS) 2 MG capsule Take 2 mg by mouth at bedtime.   Yes [provider]  Tiotropium Bromide Monohydrate (SPIRIVA RESPIMAT) 2.5 MCG/ACT AERS Inhale 2 each into the lungs daily.   Yes [provider]  traMADol (ULTRAM) 50 MG tablet Take 50 mg by mouth 2 (two) times daily. 11/21/22  Yes [provider]  white petrolatum ointment  Apply 1 Application topically at bedtime.   Yes [provider]  zolpidem (AMBIEN) 5 MG tablet Take 5 mg by mouth at bedtime.   Yes [provider]      Allergies    Erythromycin, Penicillins, and Topamax [topiramate]    Review of Systems   Review of Systems  Physical Exam Updated Vital Signs BP 136/65 (BP Location: Left Arm)   Pulse 83   Temp 98.9 F (37.2 C) (Axillary)   Resp 15   Ht 5\' 6"  (1.676 m)   Wt 91 kg   SpO2 93%   BMI 32.38 kg/m  Physical Exam Vitals and  nursing note reviewed.  Constitutional:      General: She is not in acute distress.    Appearance: She is well-developed.     Comments: Alert and tracks.  Nonverbal.  Does not respond to commands  HENT:     Head: Normocephalic and atraumatic.     Right Ear: External ear normal.     Left Ear: External ear normal.     Nose: Nose normal.  Eyes:     Extraocular Movements: Extraocular movements intact.     Conjunctiva/sclera: Conjunctivae normal.     Pupils: Pupils are equal, round, and reactive to light.  Cardiovascular:     Rate and Rhythm: Regular rhythm. Tachycardia present.     Heart sounds: Murmur heard.  Pulmonary:     Effort: Pulmonary effort is normal. No respiratory distress.     Breath sounds: Normal breath sounds.  Abdominal:     General: Abdomen is flat. There is no distension.     Palpations: Abdomen is soft. There is no mass.     Tenderness: There is no abdominal tenderness. There is no guarding.  Musculoskeletal:     Cervical back: Normal range of motion and neck supple.     Comments: No obvious deformities.  No tenderness palpation of upper or lower extremities.  Skin:    General: Skin is warm and dry.  Neurological:     Mental Status: She is alert.     Comments: No gross cranial nerve deficits.  Pupils 4 mm bilaterally.  Moves all 4 extremities equally  Psychiatric:        Mood and Affect: Mood normal.     ED Results / Procedures / Treatments   Labs (all labs ordered are listed, but only abnormal results are displayed) Labs Reviewed  COMPREHENSIVE METABOLIC PANEL - Abnormal; Notable for the following components:      Result Value   Potassium 2.9 (*)    Glucose, Bld 230 (*)    Albumin 3.3 (*)    AST 195 (*)    ALT 137 (*)    Total Bilirubin 6.5 (*)    All other components within normal limits  CBC WITH DIFFERENTIAL/PLATELET - Abnormal; Notable for the following components:   Hemoglobin 15.7 (*)    Platelets 140 (*)    Lymphs Abs 0.2 (*)    All other  components within normal limits  URINALYSIS, ROUTINE W REFLEX MICROSCOPIC - Abnormal; Notable for the following components:   Color, Urine AMBER (*)    APPearance HAZY (*)    Glucose, UA 50 (*)    Hgb urine dipstick SMALL (*)    Bacteria, UA RARE (*)    All other components within normal limits  ACETAMINOPHEN LEVEL - Abnormal; Notable for the following components:   Acetaminophen (Tylenol), Serum <10 (*)    All other components within normal  limits  SALICYLATE LEVEL - Abnormal; Notable for the following components:   Salicylate Lvl <7.0 (*)    All other components within normal limits  CBC - Abnormal; Notable for the following components:   WBC 14.6 (*)    All other components within normal limits  C-REACTIVE PROTEIN - Abnormal; Notable for the following components:   CRP 5.3 (*)    All other components within normal limits  COMPREHENSIVE METABOLIC PANEL - Abnormal; Notable for the following components:   Glucose, Bld 126 (*)    Albumin 3.1 (*)    AST 118 (*)    ALT 107 (*)    Total Bilirubin 5.1 (*)    All other components within normal limits  CBC - Abnormal; Notable for the following components:   Platelets 141 (*)    All other components within normal limits  HEMOGLOBIN A1C - Abnormal; Notable for the following components:   Hgb A1c MFr Bld 6.3 (*)    All other components within normal limits  VITAMIN B12 - Abnormal; Notable for the following components:   Vitamin B-12 178 (*)    All other components within normal limits  GLUCOSE, CAPILLARY - Abnormal; Notable for the following components:   Glucose-Capillary 163 (*)    All other components within normal limits  GLUCOSE, CAPILLARY - Abnormal; Notable for the following components:   Glucose-Capillary 158 (*)    All other components within normal limits  CBG MONITORING, ED - Abnormal; Notable for the following components:   Glucose-Capillary 229 (*)    All other components within normal limits  MRSA NEXT GEN BY PCR,  NASAL  URINE CULTURE  CULTURE, BLOOD (ROUTINE X 2)  CULTURE, BLOOD (ROUTINE X 2)  AMMONIA  CREATININE, SERUM  PROCALCITONIN  FOLATE  TSH  STREP PNEUMONIAE URINARY ANTIGEN  VITAMIN B1    EKG EKG Interpretation Date/Time:  Wednesday February 02 2023 10:21:45 EDT Ventricular Rate:  115 PR Interval:  186 QRS Duration:  95 QT Interval:  342 QTC Calculation: 473 R Axis:   -18  Text Interpretation: Sinus tachycardia Left ventricular hypertrophy Confirmed by Vonita Moss [phone]) on 02/02/2023 10:38:08 AM  Radiology MR BRAIN WO CONTRAST  Result Date: 02/02/2023 CLINICAL DATA:  Altered mental status, NPH with shunt, unwitnessed fall EXAM: MRI HEAD WITHOUT CONTRAST TECHNIQUE: Multiplanar, multiecho pulse sequences of the brain and surrounding structures were obtained without intravenous contrast. COMPARISON:  03/29/2014 MRI head, correlation is made with CT head 02/02/2023 and 10/27/2022 FINDINGS: Evaluation is limited by motion, as well as susceptibility from the patient's shunt hardware. Brain: Right parietal approach ventriculostomy catheter, which is better seen on the same-day CT, terminating in the third ventricle. Unchanged size and configuration of the ventricles compared to the most recent MRI and the 10/27/2022 CT. No acute infarct, hemorrhage, mass, mass effect, or midline shift. No extra-axial collection. Again noted is periventricular T2 hyperintense signal, which may be related to transependymal CSF resorption and/or chronic small vessel ischemic disease. 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 Vascular: Normal arterial flow voids. Skull and upper cervical spine: Normal marrow signal. Hyperostosis frontalis. Sinuses/Orbits: No acute finding. Status post bilateral lens replacements. Other: Fluid in left mastoid air cells. IMPRESSION: 1. Evaluation is limited by motion and susceptibility from the  patient's shunt hardware. Within this limitation, no acute intracranial process. 2. Unchanged size and configuration of the ventricles compared to the 2015 MRI and 10/27/2022  CT. 3. Unchanged periventricular T2 hyperintense signal, which may be related to transependymal CSF resorption and/or chronic small vessel ischemic disease. 4. 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 Electronically Signed   By: Wiliam Ke M.D.   On: 02/02/2023 19:40   DG HIP UNILAT WITH PELVIS 2-3 VIEWS RIGHT  Result Date: 02/02/2023 CLINICAL DATA:  Fall, dementia EXAM: DG HIP (WITH OR WITHOUT PELVIS) 2-3V RIGHT COMPARISON:  Abdominal radiograph 02/02/2023 FINDINGS: There is no evidence of hip fracture or dislocation. There is no evidence of arthropathy or other focal bone abnormality. IMPRESSION: Negative. Electronically Signed   By: Gaylyn Rong M.D.   On: 02/02/2023 15:27   DG HIP UNILAT WITH PELVIS 2-3 VIEWS LEFT  Result Date: 02/02/2023 CLINICAL DATA:  Fall at nursing. Nonverbal patient with disorientation. History of dementia. EXAM: DG HIP (WITH OR WITHOUT PELVIS) 2-3V LEFT COMPARISON:  Abdomen radiograph 02/02/2023 FINDINGS: VP shunt tubing terminates in the pelvis. No fracture or acute bony findings noted. IMPRESSION: 1. No acute bony findings. 2. VP shunt tubing terminates in the pelvis. Electronically Signed   By: Gaylyn Rong M.D.   On: 02/02/2023 15:27   CT Cervical Spine Wo Contrast  Result Date: 02/02/2023 CLINICAL DATA:  Neck trauma (Age >= 65y) EXAM: CT CERVICAL SPINE WITHOUT CONTRAST TECHNIQUE: Multidetector CT imaging of the cervical spine was performed without intravenous contrast. Multiplanar CT image reconstructions were also generated. RADIATION DOSE REDUCTION: This exam was performed according to the departmental dose-optimization program which includes automated exposure control, adjustment of the mA and/or kV  according to patient size and/or use of iterative reconstruction technique. COMPARISON:  CT C Spine 06/16/22 FINDINGS: Limitations: Motion degraded exam. Degree of motion artifact limits assessment of the mid and lower cervical spine. Alignment: Normal. Skull base and vertebrae: Within the limitations of a significantly motion degraded exam, definite evidence of a cervical spine fracture. Soft tissues and spinal canal: No prevertebral fluid or swelling. No visible canal hematoma. Disc levels:  No evidence of high-grade spinal canal stenosis Upper chest: Negative. Other: None IMPRESSION: Within the limitations of a significantly motion degraded exam, no definite evidence of a cervical spine fracture. Electronically Signed   By: Lorenza Cambridge M.D.   On: 02/02/2023 15:15   US Abdomen Limited RUQ (LIVER/GB)  Result Date: 02/02/2023 CLINICAL DATA:  Elevated LFTs EXAM: ULTRASOUND ABDOMEN LIMITED RIGHT UPPER QUADRANT COMPARISON:  02/02/2023 FINDINGS: Gallbladder: Prior cholecystectomy. Common bile duct: Diameter: 8 mm Liver: Heterogeneous echotexture with a slightly nodular contour as can be seen with cirrhosis. Portal vein is patent on color Doppler imaging with normal direction of blood flow towards the liver. Other: None. IMPRESSION: 1. Heterogeneous echotexture with a slightly nodular contour as can be seen with cirrhosis. 2. Prior cholecystectomy. Electronically Signed   By: Elige Ko M.D.   On: 02/02/2023 14:40   CT Head Wo Contrast  Result Date: 02/02/2023 CLINICAL DATA:  Fall, dementia EXAM: CT HEAD WITHOUT CONTRAST TECHNIQUE: Contiguous axial images were obtained from the base of the skull through the vertex without intravenous contrast. RADIATION DOSE REDUCTION: This exam was performed according to the departmental dose-optimization program which includes automated exposure control, adjustment of the mA and/or kV according to patient size and/or use of iterative reconstruction technique. COMPARISON:   None Available. FINDINGS: Evaluation is limited by motion. Brain: Right parietal approach ventriculostomy catheter with tip terminating third ventricle. Ventricular prominence appears commensurate with sulcal  size. No evidence of acute infarction, hemorrhage, mass, mass effect, or midline shift. No extra-axial fluid collection. Periventricular white matter changes, likely the sequela of chronic small vessel ischemic disease. Vascular: No hyperdense vessel. Atherosclerotic calcifications in the intracranial carotid and vertebral arteries. Skull: Negative for fracture or focal lesion. Hyperostosis frontalis. Sinuses/Orbits: No acute finding. Other: The mastoid air cells are well aerated. Evaluation of the right scalp and neck soft tissue shunt tubing is limited by motion. IMPRESSION: 1. No acute intracranial process. 2. Right parietal approach ventriculostomy catheter with tip terminating third ventricle. Ventricular prominence appears commensurate with sulcal size. Prior images would be helpful to determine if hydrocephalus is present. Electronically Signed   By: Wiliam Ke M.D.   On: 02/02/2023 12:36   DG Cervical Spine 1 View  Result Date: 02/02/2023 CLINICAL DATA:  Shunt malfunction EXAM: DG CERVICAL SPINE - 1 VIEW; ABDOMEN - 1 VIEW; CHEST 1 VIEW; SKULL - 2 VIEW COMPARISON:  None Available. 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 There is gap posterior along the skull consistent with a area of reservoir otherwise contiguous shunt tubing seen along the right side of the neck extending posterior to anterior, superior to inferior. Tubing extends vertically along the midthorax and along the epigastric region of the abdomen. The catheter extends into the right upper quadrant and then courses back to the left and inferior with the tip along the mid left lateral hemipelvis. Again no areas of discontinuity along the course of the shunt tubing.  Elsewhere, along the two views of the skull no displaced or depressed skull fracture. Grossly clear paranasal sinuses. Patient is near edentulous with some metallic hardware along the mandible. Degenerative changes seen along the spine. There is enlarged cardiopericardial silhouette with some vascular congestion and interstitial changes. No pneumothorax, effusion, consolidation. Calcified aorta. Slightly widened upper mediastinum. Gas is seen in nondilated loops of small and large bowel. No obstruction. Mild stool. Surgical clips in the right upper quadrant. No obvious free air on this supine radiograph. 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 Enlarged heart with vascular congestion and interstitial changes. Acute versus chronic. Please correlate with any prior follow up. There also some widening of the mediastinum of uncertain etiology. Please correlate again with any prior or additional workup when appropriate. Nonspecific bowel gas pattern. Electronically Signed   By: Karen Kays M.D.   On: 02/02/2023 12:14   DG Skull 1-3 Views  Result Date: 02/02/2023 CLINICAL DATA:  Shunt malfunction EXAM: DG CERVICAL SPINE - 1 VIEW; ABDOMEN - 1 VIEW; CHEST 1 VIEW; SKULL - 2 VIEW COMPARISON:  None Available. 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 There is gap posterior along the skull consistent with a area of reservoir otherwise contiguous shunt tubing seen along the right side of the neck extending posterior to anterior, superior to inferior. Tubing extends vertically along the midthorax and along the epigastric region of the abdomen. The catheter extends into the right upper quadrant and then courses back to the left and inferior with the tip along the mid left lateral hemipelvis.  Again no areas of discontinuity along the course of the shunt tubing. Elsewhere, along the two views of the skull no displaced or depressed skull fracture. Grossly clear  paranasal sinuses. Patient is near edentulous with some metallic hardware along the mandible. Degenerative changes seen along the spine. There is enlarged cardiopericardial silhouette with some vascular congestion and interstitial changes. No pneumothorax, effusion, consolidation. Calcified aorta. Slightly widened upper mediastinum. Gas is seen in nondilated loops of small and large bowel. No obstruction. Mild stool. Surgical clips in the right upper quadrant. No obvious free air on this supine radiograph. 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 Enlarged heart with vascular congestion and interstitial changes. Acute versus chronic. Please correlate with any prior follow up. There also some widening of the mediastinum of uncertain etiology. Please correlate again with any prior or additional workup when appropriate. Nonspecific bowel gas pattern. Electronically Signed   By: Karen Kays M.D.   On: 02/02/2023 12:14   DG Chest 1 View  Result Date: 02/02/2023 CLINICAL DATA:  Shunt malfunction EXAM: DG CERVICAL SPINE - 1 VIEW; ABDOMEN - 1 VIEW; CHEST 1 VIEW; SKULL - 2 VIEW COMPARISON:  None Available. 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 There is gap posterior along the skull consistent with a area of reservoir otherwise contiguous shunt tubing seen along the right side of the neck extending posterior to anterior, superior to inferior. Tubing extends vertically along the midthorax and along the epigastric region of the abdomen. The catheter extends into the right upper quadrant and then courses back to the left  and inferior with the tip along the mid left lateral hemipelvis. Again no areas of discontinuity along the course of the shunt tubing. Elsewhere, along the two views of the skull no displaced or depressed skull fracture. Grossly clear paranasal sinuses. Patient is near edentulous with some metallic hardware along the mandible. Degenerative changes seen along the spine. There is enlarged cardiopericardial silhouette with some vascular congestion and interstitial changes. No pneumothorax, effusion, consolidation. Calcified aorta. Slightly widened upper mediastinum. Gas is seen in nondilated loops of small and large bowel. No obstruction. Mild stool. Surgical clips in the right upper quadrant. No obvious free air on this supine radiograph. 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 Enlarged heart with vascular congestion and interstitial changes. Acute versus chronic. Please correlate with any prior follow up. There also some widening of the mediastinum of uncertain etiology. Please correlate again with any prior or additional workup when appropriate. Nonspecific bowel gas pattern. Electronically Signed   By: Karen Kays M.D.   On: 02/02/2023 12:14   DG Abd 1 View  Result Date: 02/02/2023 CLINICAL DATA:  Shunt malfunction EXAM: DG CERVICAL SPINE - 1 VIEW; ABDOMEN - 1 VIEW; CHEST 1 VIEW; SKULL - 2 VIEW COMPARISON:  None Available. 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 There is gap posterior along the skull consistent with a area of reservoir otherwise contiguous shunt tubing seen along the right side of the neck extending posterior to anterior, superior to inferior. Tubing extends vertically along the midthorax and along the epigastric region of the abdomen. The catheter extends  into the right upper quadrant and then courses back to the left and inferior with the tip  along the mid left lateral hemipelvis. Again no areas of discontinuity along the course of the shunt tubing. Elsewhere, along the two views of the skull no displaced or depressed skull fracture. Grossly clear paranasal sinuses. Patient is near edentulous with some metallic hardware along the mandible. Degenerative changes seen along the spine. There is enlarged cardiopericardial silhouette with some vascular congestion and interstitial changes. No pneumothorax, effusion, consolidation. Calcified aorta. Slightly widened upper mediastinum. Gas is seen in nondilated loops of small and large bowel. No obstruction. Mild stool. Surgical clips in the right upper quadrant. No obvious free air on this supine radiograph. 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 Enlarged heart with vascular congestion and interstitial changes. Acute versus chronic. Please correlate with any prior follow up. There also some widening of the mediastinum of uncertain etiology. Please correlate again with any prior or additional workup when appropriate. Nonspecific bowel gas pattern. Electronically Signed   By: Karen Kays M.D.   On: 02/02/2023 12:14    Procedures Procedures    Medications Ordered in ED Medications  potassium chloride 10 mEq in 100 mL IVPB (0 mEq Intravenous Stopped 02/02/23 1640)  LORazepam (ATIVAN) tablet 1 mg (1 mg Oral Given 02/02/23 2123)  enoxaparin (LOVENOX) injection 40 mg (40 mg Subcutaneous Given 02/02/23 2125)  sodium chloride flush (NS) 0.9 % injection 3 mL (3 mLs Intravenous Given 02/02/23 2126)  acetaminophen (TYLENOL) tablet 650 mg (650 mg Oral Given 02/02/23 1659)    Or  acetaminophen (TYLENOL) suppository 650 mg ( Rectal See Alternative 02/02/23 1659)   ondansetron (ZOFRAN) tablet 4 mg (has no administration in time range)    Or  ondansetron (ZOFRAN) injection 4 mg (has no administration in time range)  insulin aspart (novoLOG) injection 0-6 Units (has no administration in time range)  insulin aspart (novoLOG) injection 0-5 Units ( Subcutaneous Not Given 02/02/23 2200)  prazosin (MINIPRESS) capsule 2 mg (2 mg Oral Given 02/02/23 2123)  ARIPiprazole (ABILIFY) tablet 2 mg (2 mg Oral Given 02/02/23 2123)  venlafaxine XR (EFFEXOR-XR) 24 hr capsule 75 mg (has no administration in time range)  zolpidem (AMBIEN) tablet 5 mg (5 mg Oral Given 02/02/23 2123)  pantoprazole (PROTONIX) EC tablet 40 mg (has no administration in time range)  gabapentin (NEURONTIN) capsule 100 mg (100 mg Oral Given 02/02/23 2214)  diclofenac Sodium (VOLTAREN) 1 % topical gel 4 g (4 g Topical Given 02/02/23 2125)  lidocaine (XYLOCAINE) 5 % ointment 1 Application (1 Application Topical Given 02/02/23 2122)  LORazepam (ATIVAN) tablet 1 mg (1 mg Oral Not Given 02/02/23 1822)  Oral care mouth rinse (has no administration in time range)  lactated ringers bolus 1,000 mL (0 mLs Intravenous Stopped 02/02/23 1532)  potassium chloride SA (KLOR-CON M) CR tablet 40 mEq (40 mEq Oral Given 02/02/23 1722)  LORazepam (ATIVAN) injection 1 mg (1 mg Intravenous Given 02/02/23 1816)    ED Course/ Medical Decision Making/ A&P Clinical Course as of 02/03/23 0709  Wed Feb 02, 2023  1107 Patient able to tell me that her name is Kiara Dandy. Unable to answer additional questions [RP]  1408 Dw Junious Silk NP to admit the patient [RP]    Clinical Course User Index [RP] Rondel Baton, MD  Medical Decision Making Amount and/or Complexity of Data Reviewed Labs: ordered. Radiology: ordered.  Risk Prescription drug management. Decision regarding hospitalization.   PAYSLEY VOLKMER is a 77 y.o. female with comorbidities that complicate the patient evaluation including   hydrocephalus status post VP shunt, dementia, hypertension, and hyperlipidemia who presents emergency department after a fall and altered mental status.   Initial Ddx:  TBI, C-spine injury, VP shunt complication, hydrocephalus, UTI, infection  MDM/Course:  Unclear exactly what is causing the patient's altered mental status on arrival.  She has a nonfocal neuroexam but is not talking aside from only saying her name occasionally.  No seizure-like activity noted.  CT of the head and C-spine were obtained to rule out hydrocephalus or any traumatic injuries.  CT scan was unremarkable.  VP shunt series did not reveal any shunt fractures or other concerning findings.  Patient had a urinalysis that was sent which on my review does not appear to be consistent with UTI and does have some contamination.  Labs did show low potassium as well as elevated LFTs.  Unclear what this is from so ultrasound of the right upper quadrant and liver were obtained which showed some heterogenous contour of the liver which can be seen with cirrhosis.  Also had an elevated bilirubin level so ammonia was sent which was WNL.  Not particularly tender in the right upper quadrant so doubt ascending cholangitis or other acute pathology.  Did also send a Tylenol and salicylate level that were both WNL.  Did consider nonconvulsive status but patient appears to be tracking and having intentional movements so feel that this is less likely.  Upon re-evaluation remained altered and was admitted to hospitalist for further management.  Feel that stroke is less likely given her nonfocal neuroexam.  No signs of an LVO since she does not have any upper extremity weakness.  MRI was ordered and pending at the time of admission in case she did have a small stroke that is leading to expressive aphasia  This patient presents to the ED for concern of complaints listed in HPI, this involves an extensive number of treatment options, and is a complaint that  carries with it a high risk of complications and morbidity. Disposition including potential need for admission considered.   Dispo: Admit to Floor  Additional history obtained from Nursing Home/Care Facility Records reviewed Outpatient Clinic Notes The following labs were independently interpreted: Urinalysis and show no acute abnormality I independently reviewed the following imaging with scope of interpretation limited to determining acute life threatening conditions related to emergency care: CT Head and agree with the radiologist interpretation with the following exceptions: none I personally reviewed and interpreted cardiac monitoring: normal sinus rhythm  I personally reviewed and interpreted the pt's EKG: see above for interpretation  I have reviewed the patients home medications and made adjustments as needed Consults: Hospitalist Social Determinants of health:  SNF resident         Final Clinical Impression(s) / ED Diagnoses Final diagnoses:  Fall, initial encounter  S/P VP shunt  Disorientation    Rx / DC Orders ED Discharge Orders     None         Rondel Baton, MD 02/03/23 [phone]

## 2023-02-02 NOTE — Progress Notes (Signed)
Per patients' nurse, the patient is not available at the moment for EEG, tech will check back as schedule allows.,

## 2023-02-02 NOTE — ED Triage Notes (Addendum)
Pt present to ED from [facility] d/t fall. Pt nonverbal, disorientedx3 at this time. Pt has hx of dementia. Pt HOH

## 2023-02-02 NOTE — H&P (Signed)
History and Physical    Patient: Erin Shaw:[id number] DOB: [birth year] DOA: 02/02/2023 DOS: the patient was seen and examined on 02/02/2023 PCP: Pcp, No  Patient coming from: [facility]  Chief Complaint:  Chief Complaint  Patient presents with   Fall   HPI: Erin Shaw is a 77 y.o. 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  Patient apparently had been on hospice due to declining neurological function.  Healthcare power of attorney at bedside states that patient has subsequently been transferred to a new facility and has been more interactive with staff and mentation has improved.  At baseline it is reported that this patient is verbal.  She does have severe anxiety and takes multiple medications to keep this under control.  Patient was found this morning by nursing staff sitting on the floor.  Her mentation had changed.  She was unable to verbally respond or write any requested responses.  Due to her difficulty in responding verbally it was unable to be appreciated whether she was actually disoriented or not.  [facility] brought the patient to the emergency department.  Upon arrival she was afebrile, minimally tachycardic and hypertensive with initial blood pressure 152/78.  She was fidgety and anxious.  She was moving all extremities without difficulty.  Her Glasgow Coma Scale was 17.  Room air sats were 96%.  Imaging revealed no acute injuries.  Labs revealed hypokalemia 2.9, glucose 230, albumin 3.3, AST 195 and ALT 137.  Upon review of old labs her baseline LFTs are somewhat lower with an AST of 47 and an ALT of 48.  Total bilirubin was also elevated at 6.5  which is higher than normal.  Patient apparently has had prior cholecystectomy and this was confirmed on gallbladder ultrasound.Marland Kitchen  He was also reported prior to arrival that the patient had vomited coffee-ground appearing emesis.  In talking with the healthcare power of attorney patient has apparently been eating and drinking per her usual.  She does have a history of recurrent/chronic UTI.  Upon review of old records she has a history of Citrobacter that has been resistant to Ancef Rocephin Zosyn Unasyn and Fortaz in the past.  Acetaminophen and salicylate levels were within normal limits.  Urinalysis was somewhat abnormal hazy appearance amber color, 50 glucose, small hemoglobin, no leukocytes or nitrite, rare bacteria, squamous epithelials 11-20 with WBCs 0-5.  Hospitalist service has been asked to evaluate this patient for admission.   Review of Systems: As mentioned in the history of present illness. All other systems reviewed and are negative. Past Medical History:  Diagnosis Date   Anxiety    Dementia (HCC)    Hydrocephalus (HCC)    Hyperlipidemia    Hypertension    Insomnia    Osteoarthritis    Overactive bladder    Radiculopathy    Suicidal ideations    Type 2 diabetes mellitus (HCC)    Vitamin B12 deficiency    Vitamin D deficiency    Past Surgical History:  Procedure Laterality Date   BRAIN SURGERY     Social History:  reports that she has never smoked. She has never used smokeless tobacco. She reports that she does not drink alcohol and does not use drugs.  Allergies  Allergen Reactions   Erythromycin Other (See Comments)  Reaction unknown   Penicillins Other (See Comments)    Reaction unknown   Topamax [Topiramate] Other (See Comments)    Reaction unknown    History reviewed. No pertinent family history.  Prior to Admission medications   Medication Sig Start Date End Date Taking? Authorizing Provider  acetaminophen (TYLENOL) 325 MG tablet Take 650 mg by mouth  every 6 (six) hours as needed for mild pain, moderate pain or fever.   Yes [provider]  ARIPiprazole (ABILIFY) 2 MG tablet Take 2 mg by mouth at bedtime.   Yes [provider]  atorvastatin (LIPITOR) 10 MG tablet Take 10 mg by mouth daily.   Yes [provider]  Camphor-Menthol-Methyl Sal (SALONPAS) 3.08-07-08 % PTCH Apply 1 Application topically daily. Apply to both knees topically.   Yes [provider]  desvenlafaxine (PRISTIQ) 50 MG 24 hr tablet Take 50 mg by mouth daily.   Yes [provider]  diclofenac Sodium (VOLTAREN ARTHRITIS PAIN) 1 % GEL Apply 2-4 g topically in the morning, at noon, and at bedtime. Apply 2g to right shoulder and 4g to left knee.   Yes [provider]  gabapentin (NEURONTIN) 100 MG capsule Take 100 mg by mouth 3 (three) times daily.   Yes [provider]  lidocaine (XYLOCAINE) 5 % ointment Apply 1 Application topically daily. Take off after 12 hours.   Yes [provider]  LORazepam (ATIVAN) 1 MG tablet Take 1 mg by mouth in the morning, at noon, and at bedtime.   Yes [provider]  nystatin (MYCOSTATIN/NYSTOP) powder Apply 1 Application topically every 12 (twelve) hours as needed (Rash).   Yes [provider]  pantoprazole (PROTONIX) 40 MG tablet Take 40 mg by mouth daily.   Yes [provider]  prazosin (MINIPRESS) 2 MG capsule Take 2 mg by mouth at bedtime.   Yes [provider]  Tiotropium Bromide Monohydrate (SPIRIVA RESPIMAT) 2.5 MCG/ACT AERS Inhale 2 each into the lungs daily.   Yes [provider]  traMADol (ULTRAM) 50 MG tablet Take 50 mg by mouth 2 (two) times daily. 11/21/22  Yes [provider]  white petrolatum ointment Apply 1 Application topically at bedtime.   Yes [provider]  zolpidem (AMBIEN) 5 MG tablet Take 5 mg by mouth at bedtime.   Yes [provider]    Physical Exam: Vitals:   02/02/23 1245  02/02/23 1300 02/02/23 1330 02/02/23 1400  BP: (!) 154/107 (!) 142/87 (!) 147/83 (!) 147/135  Pulse: (!) 111 (!) 105 (!) 107 (!) 116  Resp: 11 (!) 26 (!) 21 (!) 25  Temp:      TempSrc:      SpO2: 96% 94% 98% 96%  Weight:      Height:       Constitutional: NAD, very anxious and restless in the bed, to be comfortable Respiratory: clear to auscultation bilaterally, no wheezing, no crackles. Normal respiratory effort. No accessory muscle use.  Cardiovascular: Regular rate and rhythm, no murmurs / rubs / gallops. No extremity edema. 2+ pedal pulses. No carotid bruits.  Abdomen: no tenderness, no masses palpated. No appreciable hepatosplenomegaly. Bowel sounds positive.  Musculoskeletal: no clubbing / cyanosis. No joint deformity upper and lower extremities. Good ROM, no contractures. Normal muscle tone.  Skin: no rashes, lesions, ulcers. No induration-upper extremity skin is cool to the touch and diaphoretic.  Overall pale appearance. Neurologic: CN 2-12 grossly intact. Sensation intact, Strength 4/5 x all 4 extremities.  Psychiatric: Awake.  Orientation difficult to appreciate accurately given patient having difficulty with speaking/expressive aphasia.  During my evaluation she basically responded with yes or no answers.  Anxious and restless mood.    Data Reviewed:  As per HPI.  Sodium noted to be 137, renal function normal with a creatinine of 0.94 with a BUN of 9, albumin low at 3.3, white count 8100 with left shift, hemoglobin 15.7 and platelets 140,000  Assessment and Plan: Acute altered mentation/possible acute metabolic encephalopathy/strokelike symptoms Initial CT head unremarkable Does have borderline UA and could have urinary tract infection Neurosurgery aware that MRI is planned to better evaluate.  Dr. Franky Macho will be seeing the patient to reprogram within the next 48 hours after MRI completed Has underlying severe anxiety and takes Ativan 1 mg 3 times daily amongst other  psychotropic medications Neurological exam unremarkable except for patient having difficulty speaking which has slightly improved since arrival although healthcare POA states she is not at baseline noting she is typically alert and oriented and extremely talkative. Ammonia level normal Rule out infection such as UTI, early pneumonia.  Follow-up on procalcitonin, urine culture, blood cultures, CRP, strep urine.  Fall at nursing facility All imaging negative for acute bony injury and neurological exam unremarkable except for above-stated difficulty speaking PT/OT evaluation  Progressive neurodegenerative disease Has been followed in the outpatient setting by neurology In the past 12 months had had worsening altered mentation.  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 No abnormalities noted on CT and per neurosurgeon patient's presentation with acute change in neurological status not consistent with VP shunt issues  Diabetes mellitus 2 CBGs and provide SSI Hemoglobin A1c  Severe underlying anxiety disorder/known depression with history of psychotic episodes Continue home medications of appropriate ZOLL, prazosin, Pristiq, Ativan Continue Ambien for insomnia  NASH cirrhosis LFTs are more than double baseline with elevated total bilirubin Clinically patient appears volume depleted so will use gentle IV fluid hydration and follow labs Patient has prior cholecystectomy Hold statin and preadmission Ultram  Chronic pain disorder Resume gabapentin but hold Ultram  Hypertension Alpha-blocker utilized for nightmares (Minipress) also effective in managing hypertension Blood pressure currently elevated due to ongoing anxiety and restlessness.  Ativan has been resumed and she will receive a dose of  Minipress tonight.   Advance Care Planning:   Code Status: DNR -confirmed by healthcare power of attorney at the bedside  VTE prophylaxis: Lovenox  Consults: Neurosurgery  Family Communication: Healthcare power of attorney at bedside.  Patient does not family or friends  Severity of Illness: The appropriate patient status for this patient is INPATIENT. Inpatient status is judged to be reasonable and necessary in order to provide the required intensity of service to ensure the patient's safety. The patient's presenting symptoms, physical exam findings, and initial radiographic and laboratory data in the context of their chronic comorbidities is felt to place them at high risk for further clinical deterioration. Furthermore, it is not anticipated that the patient will be medically stable for discharge from the hospital within 2 midnights of admission.   * I certify that at the point of admission it is my clinical judgment that the patient will require inpatient hospital care spanning beyond 2 midnights from the point of admission due to high intensity of service, high risk for further deterioration and high frequency of surveillance required.*  Author: Junious Silk, NP 02/02/2023 3:10 PM  For on call review [email or web address].

## 2023-02-02 NOTE — ED Notes (Signed)
ED TO INPATIENT HANDOFF REPORT  ED Nurse Name and Phone #: Theophilus Bones [phone]  S Name/Age/Gender Erin Shaw 77 y.o. female Room/Bed: 029C/029C  Code Status   Code Status: DNR  Home/SNF/Other Skilled nursing facility Patient oriented to: self Is this baseline?  Pt alert to name only at this time,per POA pt is able to have sensible conversations  Triage Complete: Triage complete  Chief Complaint Acute metabolic encephalopathy [G93.41]  Triage Note Pt present to ED from [facility] d/t fall. Pt nonverbal, disorientedx3 at this time. Pt has hx of dementia. Pt HOH   Allergies Allergies  Allergen Reactions   Erythromycin Other (See Comments)    Reaction unknown   Penicillins Other (See Comments)    Reaction unknown   Topamax [Topiramate] Other (See Comments)    Reaction unknown    Level of Care/Admitting Diagnosis ED Disposition     ED Disposition  Admit   Condition  --   Comment  Hospital Area: [facility] [[id number]]  Level of Care: Med-Surg [16]  May admit patient to [facility] or [facility] if equivalent level of care is available:: Yes  Covid Evaluation: Confirmed COVID Negative  Diagnosis: Acute metabolic encephalopathy [[id number]]  Admitting Physician: RAI, RIPUDEEP K [[id number]]  Attending Physician: RAI, RIPUDEEP K [[id number]]  Certification:: I certify this patient will need inpatient services for at least 2 midnights  Estimated Length of Stay: 3          B Medical/Surgery History Past Medical History:  Diagnosis Date   Anxiety    Dementia (HCC)    Hydrocephalus (HCC)    Hyperlipidemia    Hypertension    Insomnia    Osteoarthritis    Overactive bladder    Radiculopathy    Suicidal ideations    Type 2 diabetes mellitus (HCC)    Vitamin B12 deficiency    Vitamin D deficiency    Past Surgical History:  Procedure Laterality Date   BRAIN SURGERY       A IV Location/Drains/Wounds Patient Lines/Drains/Airways Status      Active Line/Drains/Airways     None            Intake/Output Last 24 hours No intake or output data in the 24 hours ending 02/02/23 1427  Labs/Imaging Results for orders placed or performed during the hospital encounter of 02/02/23 (from the past 48 hour(s))  Comprehensive metabolic panel     Status: Abnormal   Collection Time: 02/02/23 10:37 AM  Result Value Ref Range   Sodium 137 135 - 145 mmol/L   Potassium 2.9 (L) 3.5 - 5.1 mmol/L   Chloride 101 98 - 111 mmol/L   CO2 24 22 - 32 mmol/L   Glucose, Bld 230 (H) 70 - 99 mg/dL    Comment: Glucose reference range applies only to samples taken after fasting for at least 8 hours.   BUN 9 8 - 23 mg/dL   Creatinine, Ser 1.91 0.44 - 1.00 mg/dL   Calcium 9.1 8.9 - 47.8 mg/dL   Total Protein 7.0 6.5 - 8.1 g/dL   Albumin 3.3 (L) 3.5 - 5.0 g/dL   AST 295 (H) 15 - 41 U/L   ALT 137 (H) 0 - 44 U/L   Alkaline Phosphatase 110 38 - 126 U/L   Total Bilirubin 6.5 (H) 0.3 - 1.2 mg/dL   GFR, Estimated >62 >13 mL/min    Comment: (NOTE) Calculated using the CKD-EPI Creatinine Equation (2021)    Anion gap 12 5 - 15  Comment: Performed at [facility], [address], [place], [place] [address]  CBC with Differential/Platelet     Status: Abnormal   Collection Time: 02/02/23 10:37 AM  Result Value Ref Range   WBC 8.1 4.0 - 10.5 K/uL   RBC 4.89 3.87 - 5.11 MIL/uL   Hemoglobin 15.7 (H) 12.0 - 15.0 g/dL   HCT 60.4 54.0 - 98.1 %   MCV 90.4 80.0 - 100.0 fL   MCH 32.1 26.0 - 34.0 pg   MCHC 35.5 30.0 - 36.0 g/dL   RDW 19.1 47.8 - 29.5 %   Platelets 140 (L) 150 - 400 K/uL    Comment: REPEATED TO VERIFY   nRBC 0.0 0.0 - 0.2 %   Neutrophils Relative % 94 %   Neutro Abs 7.6 1.7 - 7.7 K/uL   Lymphocytes Relative 3 %   Lymphs Abs 0.2 (L) 0.7 - 4.0 K/uL   Monocytes Relative 3 %   Monocytes Absolute 0.3 0.1 - 1.0 K/uL   Eosinophils Relative 0 %   Eosinophils Absolute 0.0 0.0 - 0.5 K/uL   Basophils Relative 0 %   Basophils Absolute 0.0 0.0  - 0.1 K/uL   Immature Granulocytes 0 %   Abs Immature Granulocytes 0.02 0.00 - 0.07 K/uL    Comment: Performed at [facility], [address], [place], [place] [address]  Urinalysis, Routine w reflex microscopic -Urine, Catheterized     Status: Abnormal   Collection Time: 02/02/23 10:37 AM  Result Value Ref Range   Color, Urine AMBER (A) YELLOW    Comment: BIOCHEMICALS MAY BE AFFECTED BY COLOR   APPearance HAZY (A) CLEAR   Specific Gravity, Urine 1.012 1.005 - 1.030   pH 6.0 5.0 - 8.0   Glucose, UA 50 (A) NEGATIVE mg/dL   Hgb urine dipstick SMALL (A) NEGATIVE   Bilirubin Urine NEGATIVE NEGATIVE   Ketones, ur NEGATIVE NEGATIVE mg/dL   Protein, ur NEGATIVE NEGATIVE mg/dL   Nitrite NEGATIVE NEGATIVE   Leukocytes,Ua NEGATIVE NEGATIVE   RBC / HPF 0-5 0 - 5 RBC/hpf   WBC, UA 0-5 0 - 5 WBC/hpf   Bacteria, UA RARE (A) NONE SEEN   Squamous Epithelial / HPF 11-20 0 - 5 /HPF   Mucus PRESENT     Comment: Performed at [facility], [address], [place], [place] [address]  CBG monitoring, ED     Status: Abnormal   Collection Time: 02/02/23 11:01 AM  Result Value Ref Range   Glucose-Capillary 229 (H) 70 - 99 mg/dL    Comment: Glucose reference range applies only to samples taken after fasting for at least 8 hours.   Comment 1 Document in Chart    CT Head Wo Contrast  Result Date: 02/02/2023 CLINICAL DATA:  Fall, dementia EXAM: CT HEAD WITHOUT CONTRAST TECHNIQUE: Contiguous axial images were obtained from the base of the skull through the vertex without intravenous contrast. RADIATION DOSE REDUCTION: This exam was performed according to the departmental dose-optimization program which includes automated exposure control, adjustment of the mA and/or kV according to patient size and/or use of iterative reconstruction technique. COMPARISON:  None Available. FINDINGS: Evaluation is limited by motion. Brain: Right parietal approach ventriculostomy catheter with tip terminating third  ventricle. Ventricular prominence appears commensurate with sulcal size. No evidence of acute infarction, hemorrhage, mass, mass effect, or midline shift. No extra-axial fluid collection. Periventricular white matter changes, likely the sequela of chronic small vessel ischemic disease. Vascular: No hyperdense vessel. Atherosclerotic  calcifications in the intracranial carotid and vertebral arteries. Skull: Negative for fracture or focal lesion. Hyperostosis frontalis. Sinuses/Orbits: No acute finding. Other: The mastoid air cells are well aerated. Evaluation of the right scalp and neck soft tissue shunt tubing is limited by motion. IMPRESSION: 1. No acute intracranial process. 2. Right parietal approach ventriculostomy catheter with tip terminating third ventricle. Ventricular prominence appears commensurate with sulcal size. Prior images would be helpful to determine if hydrocephalus is present. Electronically Signed   By: Wiliam Ke M.D.   On: 02/02/2023 12:36   DG Cervical Spine 1 View  Result Date: 02/02/2023 CLINICAL DATA:  Shunt malfunction EXAM: DG CERVICAL SPINE - 1 VIEW; ABDOMEN - 1 VIEW; CHEST 1 VIEW; SKULL - 2 VIEW COMPARISON:  None Available. 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 There is gap posterior along the skull consistent with a area of reservoir otherwise contiguous shunt tubing seen along the right side of the neck extending posterior to anterior, superior to inferior. Tubing extends vertically along the midthorax and along the epigastric region of the abdomen. The catheter extends into the right upper quadrant and then courses back to the left and inferior with the tip along the mid left lateral hemipelvis. Again no areas of discontinuity along the course of the shunt tubing. Elsewhere, along the two views of the skull no displaced or depressed skull fracture. Grossly clear paranasal sinuses. Patient is near edentulous  with some metallic hardware along the mandible. Degenerative changes seen along the spine. There is enlarged cardiopericardial silhouette with some vascular congestion and interstitial changes. No pneumothorax, effusion, consolidation. Calcified aorta. Slightly widened upper mediastinum. Gas is seen in nondilated loops of small and large bowel. No obstruction. Mild stool. Surgical clips in the right upper quadrant. No obvious free air on this supine radiograph. 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 Enlarged heart with vascular congestion and interstitial changes. Acute versus chronic. Please correlate with any prior follow up. There also some widening of the mediastinum of uncertain etiology. Please correlate again with any prior or additional workup when appropriate. Nonspecific bowel gas pattern. Electronically Signed   By: Karen Kays M.D.   On: 02/02/2023 12:14   DG Skull 1-3 Views  Result Date: 02/02/2023 CLINICAL DATA:  Shunt malfunction EXAM: DG CERVICAL SPINE - 1 VIEW; ABDOMEN - 1 VIEW; CHEST 1 VIEW; SKULL - 2 VIEW COMPARISON:  None Available. 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 There is gap posterior along the skull consistent with a area of reservoir otherwise contiguous shunt tubing seen along the right side of the neck extending posterior to anterior, superior to inferior. Tubing extends vertically along the midthorax and along the epigastric region of the abdomen. The catheter extends into the right upper quadrant and then courses back to the left and inferior with the tip along the mid left lateral hemipelvis. Again no areas of discontinuity along the course of the shunt tubing. Elsewhere, along the two views of the skull no displaced or depressed skull  fracture. Grossly clear paranasal sinuses. Patient is near edentulous with some metallic hardware along the mandible. Degenerative changes seen along the spine. There is enlarged cardiopericardial silhouette with some vascular congestion and interstitial changes. No pneumothorax, effusion, consolidation.  Calcified aorta. Slightly widened upper mediastinum. Gas is seen in nondilated loops of small and large bowel. No obstruction. Mild stool. Surgical clips in the right upper quadrant. No obvious free air on this supine radiograph. 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 Enlarged heart with vascular congestion and interstitial changes. Acute versus chronic. Please correlate with any prior follow up. There also some widening of the mediastinum of uncertain etiology. Please correlate again with any prior or additional workup when appropriate. Nonspecific bowel gas pattern. Electronically Signed   By: Karen Kays M.D.   On: 02/02/2023 12:14   DG Chest 1 View  Result Date: 02/02/2023 CLINICAL DATA:  Shunt malfunction EXAM: DG CERVICAL SPINE - 1 VIEW; ABDOMEN - 1 VIEW; CHEST 1 VIEW; SKULL - 2 VIEW COMPARISON:  None Available. 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 There is gap posterior along the skull consistent with a area of reservoir otherwise contiguous shunt tubing seen along the right side of the neck extending posterior to anterior, superior to inferior. Tubing extends vertically along the midthorax and along the epigastric region of the abdomen. The catheter extends into the right upper quadrant and then courses back to the left and inferior with the tip along the mid left lateral hemipelvis. Again no areas of discontinuity along the course of the shunt tubing. Elsewhere,  along the two views of the skull no displaced or depressed skull fracture. Grossly clear paranasal sinuses. Patient is near edentulous with some metallic hardware along the mandible. Degenerative changes seen along the spine. There is enlarged cardiopericardial silhouette with some vascular congestion and interstitial changes. No pneumothorax, effusion, consolidation. Calcified aorta. Slightly widened upper mediastinum. Gas is seen in nondilated loops of small and large bowel. No obstruction. Mild stool. Surgical clips in the right upper quadrant. No obvious free air on this supine radiograph. 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 Enlarged heart with vascular congestion and interstitial changes. Acute versus chronic. Please correlate with any prior follow up. There also some widening of the mediastinum of uncertain etiology. Please correlate again with any prior or additional workup when appropriate. Nonspecific bowel gas pattern. Electronically Signed   By: Karen Kays M.D.   On: 02/02/2023 12:14   DG Abd 1 View  Result Date: 02/02/2023 CLINICAL DATA:  Shunt malfunction EXAM: DG CERVICAL SPINE - 1 VIEW; ABDOMEN - 1 VIEW; CHEST 1 VIEW; SKULL - 2 VIEW COMPARISON:  None Available. 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 There is gap posterior along the skull consistent with a area of reservoir otherwise contiguous shunt tubing seen along the right side of the neck extending posterior to anterior, superior to inferior. Tubing extends vertically along the midthorax and along the epigastric region of the abdomen. The catheter extends into the right upper quadrant and then courses back to the left and inferior with the tip along the mid left lateral hemipelvis. Again no areas of  discontinuity along the course of the shunt tubing. Elsewhere, along the two views of the skull no displaced or depressed skull fracture. Grossly clear paranasal  sinuses. Patient is near edentulous with some metallic hardware along the mandible. Degenerative changes seen along the spine. There is enlarged cardiopericardial silhouette with some vascular congestion and interstitial changes. No pneumothorax, effusion, consolidation. Calcified aorta. Slightly widened upper mediastinum. Gas is seen in nondilated loops of small and large bowel. No obstruction. Mild stool. Surgical clips in the right upper quadrant. No obvious free air on this supine radiograph. 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 Enlarged heart with vascular congestion and interstitial changes. Acute versus chronic. Please correlate with any prior follow up. There also some widening of the mediastinum of uncertain etiology. Please correlate again with any prior or additional workup when appropriate. Nonspecific bowel gas pattern. Electronically Signed   By: Karen Kays M.D.   On: 02/02/2023 12:14    Pending Labs Unresulted Labs (From admission, onward)     Start     Ordered   02/09/23 0500  Creatinine, serum  (enoxaparin (LOVENOX)    CrCl >/= 30 ml/min)  Weekly,   R     Comments: while on enoxaparin therapy    02/02/23 1424   02/03/23 0500  Comprehensive metabolic panel  Tomorrow morning,   R        02/02/23 1424   02/03/23 0500  CBC  Tomorrow morning,   R        02/02/23 1424   02/02/23 1423  CBC  (enoxaparin (LOVENOX)    CrCl >/= 30 ml/min)  Once,   R       Comments: Baseline for enoxaparin therapy IF NOT ALREADY DRAWN.  Notify MD if PLT < 100 K.    02/02/23 1424   02/02/23 1423  Creatinine, serum  (enoxaparin (LOVENOX)    CrCl >/= 30 ml/min)  Once,   R        Comments: Baseline for enoxaparin therapy IF NOT ALREADY DRAWN.    02/02/23 1424   02/02/23 1253  Acetaminophen level  Once,   STAT        02/02/23 1252   02/02/23 1253  Salicylate level  Once,   STAT        02/02/23 1252   02/02/23 1253  Ammonia  Once,   STAT        02/02/23 1252            Vitals/Pain Today's Vitals   02/02/23 1245 02/02/23 1300 02/02/23 1330 02/02/23 1400  BP: (!) 154/107 (!) 142/87 (!) 147/83 (!) 147/135  Pulse: (!) 111 (!) 105 (!) 107 (!) 116  Resp: 11 (!) 26 (!) 21 (!) 25  Temp:      TempSrc:      SpO2: 96% 94% 98% 96%  Weight:      Height:        Isolation Precautions No active isolations  Medications Medications  potassium chloride 10 mEq in 100 mL IVPB (10 mEq Intravenous New Bag/Given 02/02/23 1334)  LORazepam (ATIVAN) tablet 1 mg (has no administration in time range)  enoxaparin (LOVENOX) injection 40 mg (has no administration in time range)  sodium chloride flush (NS) 0.9 % injection 3 mL (has no administration in time range)  acetaminophen (TYLENOL) tablet 650 mg (has no administration in time range)    Or  acetaminophen (TYLENOL) suppository 650 mg (has no administration in time range)  ondansetron (ZOFRAN) tablet 4 mg (  has no administration in time range)    Or  ondansetron [facility]) injection 4 mg (has no administration in time range)  lactated ringers bolus 1,000 mL (1,000 mLs Intravenous Bolus 02/02/23 1249)    Mobility walks with device     Focused Assessments Neuro Assessment Handoff:  Swallow screen pass? Yes  Cardiac Rhythm: Sinus tachycardia       Neuro Assessment:   Neuro Checks:      Has TPA been given? No If patient is a Neuro Trauma and patient is going to OR before floor call report to 4N Charge nurse: [phone] or [phone]   R Recommendations: See Admitting Provider Note  Report given to:   Additional Notes:  pt tends to sit up in bed, POA states hx of coming out of bed.

## 2023-02-03 ENCOUNTER — Inpatient Hospital Stay (HOSPITAL_COMMUNITY): Payer: Medicare (Managed Care)

## 2023-02-03 DIAGNOSIS — E119 Type 2 diabetes mellitus without complications: Secondary | ICD-10-CM

## 2023-02-03 DIAGNOSIS — G9341 Metabolic encephalopathy: Secondary | ICD-10-CM

## 2023-02-03 DIAGNOSIS — Z982 Presence of cerebrospinal fluid drainage device: Secondary | ICD-10-CM

## 2023-02-03 DIAGNOSIS — F419 Anxiety disorder, unspecified: Secondary | ICD-10-CM | POA: Diagnosis present

## 2023-02-03 DIAGNOSIS — R7881 Bacteremia: Secondary | ICD-10-CM | POA: Diagnosis present

## 2023-02-03 DIAGNOSIS — E538 Deficiency of other specified B group vitamins: Secondary | ICD-10-CM | POA: Diagnosis present

## 2023-02-03 DIAGNOSIS — G912 (Idiopathic) normal pressure hydrocephalus: Secondary | ICD-10-CM | POA: Diagnosis present

## 2023-02-03 DIAGNOSIS — G319 Degenerative disease of nervous system, unspecified: Secondary | ICD-10-CM

## 2023-02-03 DIAGNOSIS — K7581 Nonalcoholic steatohepatitis (NASH): Secondary | ICD-10-CM | POA: Diagnosis present

## 2023-02-03 DIAGNOSIS — R410 Disorientation, unspecified: Secondary | ICD-10-CM

## 2023-02-03 DIAGNOSIS — N390 Urinary tract infection, site not specified: Secondary | ICD-10-CM | POA: Diagnosis present

## 2023-02-03 DIAGNOSIS — W19XXXA Unspecified fall, initial encounter: Secondary | ICD-10-CM

## 2023-02-03 DIAGNOSIS — R569 Unspecified convulsions: Secondary | ICD-10-CM

## 2023-02-03 DIAGNOSIS — R4182 Altered mental status, unspecified: Secondary | ICD-10-CM

## 2023-02-03 LAB — CBC
HCT: 40.1 % (ref 36.0–46.0)
Hemoglobin: 14 g/dL (ref 12.0–15.0)
MCH: 31.3 pg (ref 26.0–34.0)
MCHC: 34.9 g/dL (ref 30.0–36.0)
MCV: 89.5 fL (ref 80.0–100.0)
Platelets: 141 10*3/uL — ABNORMAL LOW (ref 150–400)
RBC: 4.48 MIL/uL (ref 3.87–5.11)
RDW: 12.3 % (ref 11.5–15.5)
WBC: 9.4 10*3/uL (ref 4.0–10.5)
nRBC: 0 % (ref 0.0–0.2)

## 2023-02-03 LAB — BLOOD CULTURE ID PANEL (REFLEXED) - BCID2

## 2023-02-03 LAB — GLUCOSE, CAPILLARY
Glucose-Capillary: 158 mg/dL — ABNORMAL HIGH (ref 70–99)
Glucose-Capillary: 138 mg/dL — ABNORMAL HIGH (ref 70–99)
Glucose-Capillary: 179 mg/dL — ABNORMAL HIGH (ref 70–99)
Glucose-Capillary: 107 mg/dL — ABNORMAL HIGH (ref 70–99)

## 2023-02-03 LAB — URINALYSIS, W/ REFLEX TO CULTURE (INFECTION SUSPECTED)
Bilirubin Urine: NEGATIVE
Glucose, UA: NEGATIVE mg/dL
Ketones, ur: NEGATIVE mg/dL
Nitrite: NEGATIVE
Protein, ur: 30 mg/dL — AB
Specific Gravity, Urine: 1.027 (ref 1.005–1.030)
pH: 6 (ref 5.0–8.0)

## 2023-02-03 LAB — COMPREHENSIVE METABOLIC PANEL
ALT: 107 U/L — ABNORMAL HIGH (ref 0–44)
AST: 118 U/L — ABNORMAL HIGH (ref 15–41)
Albumin: 3.1 g/dL — ABNORMAL LOW (ref 3.5–5.0)
Alkaline Phosphatase: 93 U/L (ref 38–126)
Anion gap: 10 (ref 5–15)
BUN: 12 mg/dL (ref 8–23)
CO2: 26 mmol/L (ref 22–32)
Calcium: 9 mg/dL (ref 8.9–10.3)
Chloride: 101 mmol/L (ref 98–111)
Creatinine, Ser: 0.84 mg/dL (ref 0.44–1.00)
GFR, Estimated: >60 mL/min (ref >60)
Glucose, Bld: 126 mg/dL — ABNORMAL HIGH (ref 70–99)
Potassium: 4 mmol/L (ref 3.5–5.1)
Sodium: 137 mmol/L (ref 135–145)
Total Bilirubin: 5.1 mg/dL — ABNORMAL HIGH (ref 0.3–1.2)
Total Protein: 6.6 g/dL (ref 6.5–8.1)

## 2023-02-03 LAB — PROCALCITONIN: Procalcitonin: 17.03 ng/mL

## 2023-02-03 LAB — CULTURE, BLOOD (ROUTINE X 2): Special Requests: ADEQUATE

## 2023-02-03 LAB — STREP PNEUMONIAE URINARY ANTIGEN: Strep Pneumo Urinary Antigen: NEGATIVE

## 2023-02-03 MED ORDER — CYANOCOBALAMIN 1000 MCG/ML IJ SOLN
1000.0000 ug | Freq: Every day | INTRAMUSCULAR | Status: DC
  Administered 2023-02-03 – 2023-02-05 (×3): 1000 ug via INTRAMUSCULAR
  Filled 2023-02-03 (×3): qty 1

## 2023-02-03 MED ORDER — SODIUM CHLORIDE 0.9 % IV SOLN
2.0000 g | INTRAVENOUS | Status: DC
  Administered 2023-02-03 – 2023-02-05 (×3): 2 g via INTRAVENOUS
  Filled 2023-02-03 (×3): qty 20

## 2023-02-03 MED ORDER — LACTATED RINGERS IV SOLN: INTRAVENOUS | Status: AC

## 2023-02-03 NOTE — TOC Initial Note (Signed)
Transition of Care [facility]) - Initial/Assessment Note    Patient Details  Name: Erin Shaw MRN: [id number] Date of Birth: Feb 18, 1946  [facility]) CM/SW Contact:    Baldemar Lenis, LCSW Phone Number: 02/03/2023, 11:40 AM  Clinical Narrative:            Patient is a resident at [facility], able to return when medically stable. CSW to follow.       Expected Discharge Plan: Skilled Nursing Facility Barriers to Discharge: Continued Medical Work up   Patient Goals and CMS Choice Patient states their goals for this hospitalization and ongoing recovery are:: patient unable to participate in goal setting, not oriented          Expected Discharge Plan and Services     Post Acute Care Choice: Skilled Nursing Facility Living arrangements for the past 2 months: Skilled Nursing Facility                                      Prior Living Arrangements/Services Living arrangements for the past 2 months: Skilled Nursing Facility Lives with:: Facility Resident Patient language and need for interpreter reviewed:: No        Need for Family Participation in Patient Care: Yes (Comment) Care giver support system in place?: Yes (comment)   Criminal Activity/Legal Involvement Pertinent to Current Situation/Hospitalization: No - Comment as needed  Activities of Daily Living Home Assistive Devices/Equipment: Wheelchair, Environmental consultant (specify type), Eyeglasses, Dentures (specify type), Hearing aid ADL Screening (condition at time of admission) Patient's cognitive ability adequate to safely complete daily activities?: No Is the patient deaf or have difficulty hearing?: Yes (deaf, hearing aids (not with her)) Does the patient have difficulty seeing, even when wearing glasses/contacts?: Yes (glasses) Does the patient have difficulty concentrating, remembering, or making decisions?: Yes Patient able to express need for assistance with ADLs?: No Does the patient have  difficulty dressing or bathing?: Yes Independently performs ADLs?: No Does the patient have difficulty walking or climbing stairs?: Yes (walker/wheelchair) Weakness of Legs: Both Weakness of Arms/Hands: Both  Permission Sought/Granted                  Emotional Assessment   Attitude/Demeanor/Rapport: Unable to Assess Affect (typically observed): Unable to Assess Orientation: : Oriented to Self, Oriented to Place Alcohol / Substance Use: Not Applicable Psych Involvement: No (comment)  Admission diagnosis:  Acute metabolic encephalopathy [G93.41] Patient Active Problem List   Diagnosis Date Noted   UTI (urinary tract infection) 02/03/2023   Bacteremia due to Klebsiella pneumoniae 02/03/2023   Neurodegenerative disorder (HCC) 02/03/2023   NPH (normal pressure hydrocephalus) (HCC) 02/03/2023   NASH (nonalcoholic steatohepatitis) 02/03/2023   Anxiety disorder 02/03/2023   B12 deficiency 02/03/2023   Controlled type 2 diabetes mellitus without complication, without long-term current use of insulin (HCC) 02/03/2023   Acute metabolic encephalopathy 02/02/2023   PCP:  Pcp, No Pharmacy:  No Pharmacies Listed    Social Determinants of Health (SDOH) Social History: SDOH Screenings   Food Insecurity: No Food Insecurity (02/02/2023)  Housing: Low Risk  (02/02/2023)  Transportation Needs: No Transportation Needs (02/02/2023)  Utilities: Not At Risk (02/02/2023)  Tobacco Use: Low Risk  (02/02/2023)   SDOH Interventions: Utilities Interventions: Intervention Not Indicated   Readmission Risk Interventions     No data to display

## 2023-02-03 NOTE — Plan of Care (Signed)
  Problem: Education: Goal: Knowledge of General Education information will improve Description: Including pain rating scale, medication(s)/side effects and non-pharmacologic comfort measures Outcome: Progressing   Problem: Health Behavior/Discharge Planning: Goal: Ability to manage health-related needs will improve Outcome: Progressing   Problem: Clinical Measurements: Goal: Ability to maintain clinical measurements within normal limits will improve Outcome: Progressing Goal: Will remain free from infection Outcome: Progressing Goal: Diagnostic test results will improve Outcome: Progressing Goal: Respiratory complications will improve Outcome: Progressing Goal: Cardiovascular complication will be avoided Outcome: Progressing   Problem: Activity: Goal: Risk for activity intolerance will decrease Outcome: Progressing   Problem: Nutrition: Goal: Adequate nutrition will be maintained Outcome: Progressing   Problem: Coping: Goal: Level of anxiety will decrease Outcome: Progressing   Problem: Elimination: Goal: Will not experience complications related to bowel motility Outcome: Progressing Goal: Will not experience complications related to urinary retention Outcome: Progressing   Problem: Pain Managment: Goal: General experience of comfort will improve Outcome: Progressing   Problem: Safety: Goal: Ability to remain free from injury will improve Outcome: Progressing   Problem: Skin Integrity: Goal: Risk for impaired skin integrity will decrease Outcome: Progressing   Problem: Education: Goal: Ability to describe self-care measures that may prevent or decrease complications (Diabetes Survival Skills Education) will improve Outcome: Progressing Goal: Individualized Educational Video(s) Outcome: Progressing   Problem: Coping: Goal: Ability to adjust to condition or change in health will improve Outcome: Progressing   Problem: Fluid Volume: Goal: Ability to  maintain a balanced intake and output will improve Outcome: Progressing   Problem: Health Behavior/Discharge Planning: Goal: Ability to identify and utilize available resources and services will improve Outcome: Progressing Goal: Ability to manage health-related needs will improve Outcome: Progressing   Problem: Metabolic: Goal: Ability to maintain appropriate glucose levels will improve Outcome: Progressing   Problem: Nutritional: Goal: Maintenance of adequate nutrition will improve Outcome: Progressing Goal: Progress toward achieving an optimal weight will improve Outcome: Progressing   Problem: Skin Integrity: Goal: Risk for impaired skin integrity will decrease Outcome: Progressing   Problem: Tissue Perfusion: Goal: Adequacy of tissue perfusion will improve Outcome: Progressing   Problem: Education: Goal: Ability to describe self-care measures that may prevent or decrease complications (Diabetes Survival Skills Education) will improve Outcome: Progressing Goal: Individualized Educational Video(s) Outcome: Progressing   Problem: Coping: Goal: Ability to adjust to condition or change in health will improve Outcome: Progressing   Problem: Fluid Volume: Goal: Ability to maintain a balanced intake and output will improve Outcome: Progressing   Problem: Health Behavior/Discharge Planning: Goal: Ability to identify and utilize available resources and services will improve Outcome: Progressing Goal: Ability to manage health-related needs will improve Outcome: Progressing   Problem: Metabolic: Goal: Ability to maintain appropriate glucose levels will improve Outcome: Progressing   Problem: Nutritional: Goal: Maintenance of adequate nutrition will improve Outcome: Progressing Goal: Progress toward achieving an optimal weight will improve Outcome: Progressing   Problem: Skin Integrity: Goal: Risk for impaired skin integrity will decrease Outcome: Progressing    Problem: Tissue Perfusion: Goal: Adequacy of tissue perfusion will improve Outcome: Progressing   

## 2023-02-03 NOTE — Progress Notes (Signed)
PHARMACY - PHYSICIAN COMMUNICATION CRITICAL VALUE ALERT - BLOOD CULTURE IDENTIFICATION (BCID)  Erin Shaw is an 77 y.o. female who presented to [facility] on 02/02/2023 with a chief complaint of AMS  Assessment:  1/4 Klebsiella pneumoniae (no resistance on BCID)  Name of physician (or Provider) Contacted: Rai   Current antibiotics: None  Changes to prescribed antibiotics recommended:  Start ceftriaxone 2g IV q24h  Results for orders placed or performed during the hospital encounter of 02/02/23  Blood Culture ID Panel (Reflexed) (Collected: 02/02/2023  4:49 PM)  Result Value Ref Range   Enterococcus faecalis NOT DETECTED NOT DETECTED   Enterococcus Faecium NOT DETECTED NOT DETECTED   Listeria monocytogenes NOT DETECTED NOT DETECTED   Staphylococcus species NOT DETECTED NOT DETECTED   Staphylococcus aureus (BCID) NOT DETECTED NOT DETECTED   Staphylococcus epidermidis NOT DETECTED NOT DETECTED   Staphylococcus lugdunensis NOT DETECTED NOT DETECTED   Streptococcus species NOT DETECTED NOT DETECTED   Streptococcus agalactiae NOT DETECTED NOT DETECTED   Streptococcus pneumoniae NOT DETECTED NOT DETECTED   Streptococcus pyogenes NOT DETECTED NOT DETECTED   A.calcoaceticus-baumannii NOT DETECTED NOT DETECTED   Bacteroides fragilis NOT DETECTED NOT DETECTED   Enterobacterales DETECTED (A) NOT DETECTED   Enterobacter cloacae complex NOT DETECTED NOT DETECTED   Escherichia coli NOT DETECTED NOT DETECTED   Klebsiella aerogenes NOT DETECTED NOT DETECTED   Klebsiella oxytoca NOT DETECTED NOT DETECTED   Klebsiella pneumoniae DETECTED (A) NOT DETECTED   Proteus species NOT DETECTED NOT DETECTED   Salmonella species NOT DETECTED NOT DETECTED   Serratia marcescens NOT DETECTED NOT DETECTED   Haemophilus influenzae NOT DETECTED NOT DETECTED   Neisseria meningitidis NOT DETECTED NOT DETECTED   Pseudomonas aeruginosa NOT DETECTED NOT DETECTED   Stenotrophomonas maltophilia NOT DETECTED NOT  DETECTED   Candida albicans NOT DETECTED NOT DETECTED   Candida auris NOT DETECTED NOT DETECTED   Candida glabrata NOT DETECTED NOT DETECTED   Candida krusei NOT DETECTED NOT DETECTED   Candida parapsilosis NOT DETECTED NOT DETECTED   Candida tropicalis NOT DETECTED NOT DETECTED   Cryptococcus neoformans/gattii NOT DETECTED NOT DETECTED   CTX-M ESBL NOT DETECTED NOT DETECTED   Carbapenem resistance IMP NOT DETECTED NOT DETECTED   Carbapenem resistance KPC NOT DETECTED NOT DETECTED   Carbapenem resistance NDM NOT DETECTED NOT DETECTED   Carbapenem resist OXA 48 LIKE NOT DETECTED NOT DETECTED   Carbapenem resistance VIM NOT DETECTED NOT DETECTED    Calton Dach, PharmD Clinical Pharmacist 02/03/2023 10:26 AM

## 2023-02-03 NOTE — Evaluation (Signed)
Speech Language Pathology Evaluation Patient Details Name: Erin Shaw MRN: [id number] DOB: 1946/01/23 Today's Date: 02/03/2023 Time: 1200-1215 SLP Time Calculation (min) (ACUTE ONLY): 15 min  Problem List:  Patient Active Problem List   Diagnosis Date Noted   UTI (urinary tract infection) 02/03/2023   Bacteremia due to Klebsiella pneumoniae 02/03/2023   Neurodegenerative disorder (HCC) 02/03/2023   NPH (normal pressure hydrocephalus) (HCC) 02/03/2023   NASH (nonalcoholic steatohepatitis) 02/03/2023   Anxiety disorder 02/03/2023   B12 deficiency 02/03/2023   Controlled type 2 diabetes mellitus without complication, without long-term current use of insulin (HCC) 02/03/2023   Acute metabolic encephalopathy 02/02/2023   Past Medical History:  Past Medical History:  Diagnosis Date   Anxiety    Dementia (HCC)    Hydrocephalus (HCC)    Hyperlipidemia    Hypertension    Insomnia    Osteoarthritis    Overactive bladder    Radiculopathy    Suicidal ideations    Type 2 diabetes mellitus (HCC)    Vitamin B12 deficiency    Vitamin D deficiency    Past Surgical History:  Past Surgical History:  Procedure Laterality Date   BRAIN SURGERY     HPI:  Patient is a 77 y.o. female with PMH: dementia, NASH, recurrent UTI's, progressive neurodegenerative disease, NPH s/p shunt, anxiety, depression, HTN, insomnia, OA, DM-2. She has been a resident at [facility] for approximately a year. She presented to the hospital from her SNF on 02/02/2023 after being found on the floor by nursing staff in the morning. She was having difficulty communicating and EMS was called. In ED she was minimally tachycardic, oxygen saturations 95% on RA. Imaging did not reveal any acute injuries.   Assessment / Plan / Recommendation Clinical Impression  Patient is not presenting with any significant change in cognitive functioning as per this evaluation and speaking with her power of attorney who was in the  room. At baseline, patient lives in a SNF for the past year. Power of attorney denied any observed changed in patient's cognition, speech, language and felt that she was "the same". Patient was oriented to time and place but not completely to situation. She has mitts on both hands due to her pulling IV out previous date. She is HOH but when SLP close to her ear and speaking loudly , she was able to respond accurately to all questions. She did ask SLP if IV could be taken out. As patient appears to be at her baseline for cognition, no further skilled intervention warranted at this time.    SLP Assessment  SLP Recommendation/Assessment: Patient does not need any further Speech Lanaguage Pathology Services SLP Visit Diagnosis: Cognitive communication deficit (R41.841)    Recommendations for follow up therapy are one component of a multi-disciplinary discharge planning process, led by the attending physician.  Recommendations may be updated based on patient status, additional functional criteria and insurance authorization.    Follow Up Recommendations  No SLP follow up    Assistance Recommended at Discharge  Frequent or constant Supervision/Assistance  Functional Status Assessment Patient has not had a recent decline in their functional status  Frequency and Duration   N/A        SLP Evaluation Cognition  Overall Cognitive Status: History of cognitive impairments - at baseline Arousal/Alertness: Awake/alert Orientation Level: Oriented to person;Disoriented to situation;Oriented to place;Oriented to time Year: 2024 Month: July Day of Week: Correct       Comprehension  Auditory Comprehension Overall Auditory Comprehension:  Appears within functional limits for tasks assessed    Expression Expression Primary Mode of Expression: Verbal Verbal Expression Overall Verbal Expression: Appears within functional limits for tasks assessed   Oral / Motor  Oral Motor/Sensory Function Overall Oral  Motor/Sensory Function: Other (comment) (at baseline)   Angela Nevin, MA, CCC-SLP Speech Therapy

## 2023-02-03 NOTE — Progress Notes (Signed)
[facility]                                                                              Erin Shaw, is a 77 y.o. female, DOB - 04/09/1946, ZOX:[id number] Admit date - 02/02/2023    Outpatient Primary MD for the patient is Pcp, No  LOS - 1  days  Chief Complaint  Patient presents with   Fall       Brief summary   Patient is a 77 year old female with NPH status post programmable VP shunt, history of progressive neurodegenerative disease,, anxiety disorder, severe depression with prior psychotic episodes, dyslipidemia, HTN, insomnia, osteoarthritis, recurrent UTIs, DM type II, dementia presented to ED after a fall from SNF and altered mental status.  Patient apparently had been on hospice due to declining neurological function.  HPOA at bedside had reported that patient had been transferred to a new facility and had become more interactive with improved mentation.  At baseline it is reported that this patient is verbal. She does have severe anxiety and takes multiple medications to keep this under control.  Also was reported that patient had coffee-ground emesis prior to arrival.  Patient was found to the morning of admission, nursing staff on the floor, mentation had changed. She was unable to verbally respond or write any requested responses. Due to her difficulty in responding verbally it was unable to be appreciated whether she was actually disoriented or not.  In ED, she was minimally tachycardic and hypertensive, fidgety and anxious.  Moving all her extremities without difficulty.  Labs showed hypokalemia 2.9, glucose 230, AST 195, ALT 137.  Total bilirubin 6.5.  Prior history of cholecystectomy. Patient was admitted for further workup.   Assessment & Plan    Principal Problem:   Acute metabolic encephalopathy -Likely due to UTI and Klebsiella bacteremia, improving today -MRI of the brain showed no acute intracranial process, unchanged size and configuration of  the ventricles compared to previous MRI -Elevated procalcitonin, UA positive for UTI, started on IV Rocephin, will follow cultures and sensitivities -Much more alert and oriented today,  closer to her baseline. -Ammonia level normal, LFTs improving, total bilirubin improving, TSH 0.8 - B12 deficiency, started on B12 replacement -EEG showed moderate diffuse encephalopathy, nonspecific, no seizures or epileptiform discharges  Active Problems:   UTI (urinary tract infection) -Urine in the Pure[employer]ick this morning noted to be dark, repeated UA and culture, positive for UTI, -Placed on IV Rocephin, follow cultures/sensitivities    Bacteremia due to Klebsiella pneumoniae -BCID positive for Klebsiella, UA positive for UTI -Given mild transaminitis which could be from NASH, lower abdominal pain, rule out any abdominal pathology, follow CT abdomen pelvis     Neurodegenerative disorder (HCC),   NPH (normal pressure hydrocephalus) (HCC) -Neurosurgery was consulted to reprogram the shunt    NASH (nonalcoholic steatohepatitis) -Known history of Nash, LFTs improving, T. bili improving 6.5 on admission -Hold statin, Ultram -Abdominal ultrasound showed heterogenous echotexture of the liver as seen in the cirrhosis, prior cholecystectomy    Anxiety disorder with depression -Continue psych medications including prazosin, Pristiq, Ativan, Abilify    B12 deficiency -B12 178, placed  on vitamin B12 1000 mcg IM daily, will transition to oral closer to discharge -Folic acid normal 9.5    Controlled type 2 diabetes mellitus without complication, without long-term current use of insulin (HCC) -Continue sliding scale insulin while inpatient -Hemoglobin A1c 6.3  Obesity Estimated body mass index is 32.38 kg/m as calculated from the following:   Height as of this encounter: 5\' 6"  (1.676 m).   Weight as of this encounter: 91 kg.  Code Status: DNR DVT Prophylaxis:  enoxaparin (LOVENOX) injection 40 mg  Start: 02/02/23 2200   Level of Care: Level of care: Med-Surg Family Communication: Called patient's POA, Ms. Edrick Oh unable to reach her, left a detailed voicemail message Disposition Plan:      Remains inpatient appropriate:     Procedures:  EEG  Consultants:   None  Antimicrobials:   Anti-infectives (From admission, onward)    Start     Dose/Rate Route Frequency Ordered Stop   02/03/23 1130  cefTRIAXone (ROCEPHIN) 2 g in sodium chloride 0.9 % 100 mL IVPB        2 g 200 mL/hr over 30 Minutes Intravenous Every 24 hours 02/03/23 1030            Medications  ARIPiprazole  2 mg Oral QHS   cyanocobalamin  1,000 mcg Intramuscular Daily   diclofenac Sodium  4 g Topical QID   enoxaparin (LOVENOX) injection  40 mg Subcutaneous Q24H   gabapentin  100 mg Oral TID   insulin aspart  0-5 Units Subcutaneous QHS   insulin aspart  0-6 Units Subcutaneous TID WC   lidocaine  1 Application Topical Daily   LORazepam  1 mg Oral TID   LORazepam  1 mg Oral Once   pantoprazole  40 mg Oral Daily   prazosin  2 mg Oral QHS   sodium chloride flush  3 mL Intravenous Q12H   venlafaxine XR  75 mg Oral Q breakfast   zolpidem  5 mg Oral QHS      Subjective:   Erin Shaw was seen and examined today.  Much more alert and awake, oriented today.  Following commands, no expressive aphasia noted.  Appears closer to her baseline mental status.  Patient denies dizziness, chest pain, shortness of breath, N/V/D/C.  No fevers, however complains of lower abdominal pain " tummy ache"  Objective:   Vitals:   02/02/23 2030 02/02/23 2300 02/03/23 0300 02/03/23 1023  BP: 124/73 113/61 136/65 121/82  Pulse: 70 87 83 85  Resp: 18 17 15 18   Temp: 97.8 F (36.6 C) 98.1 F (36.7 C) 98.9 F (37.2 C) 99.2 F (37.3 C)  TempSrc: Axillary Oral Axillary Oral  SpO2: 96% 98% 93% 94%  Weight:      Height:        Intake/Output Summary (Last 24 hours) at 02/03/2023 1127 Last data filed at 02/03/2023  0300 Gross per 24 hour  Intake 383.81 ml  Output 150 ml  Net 233.81 ml     Wt Readings from Last 3 Encounters:  02/02/23 91 kg     Exam General: Mental status improving, much more alert and oriented, speech clear Cardiovascular: S1 S2 auscultated,  RRR Respiratory: Clear to auscultation bilaterally, no wheezing, rales or rhonchi Gastrointestinal: Soft, nondistended, + bowel sounds, no significant tenderness on palpation Ext: no pedal edema bilaterally Neuro: Strength 5/5 upper and lower extremities bilaterally Psych: Normal affect     Data Reviewed:  I have personally reviewed following labs    CBC  Lab Results  Component Value Date   WBC 9.4 February 26, 2023   RBC 4.48 02/26/23   HGB 14.0 2023-02-26   HCT 40.1 02/26/23   MCV 89.5 February 26, 2023   MCH 31.3 02-26-2023   PLT 141 (L) 02/26/23   MCHC 34.9 02-26-23   RDW 12.3 26-Feb-2023   LYMPHSABS 0.2 (L) 02/02/2023   MONOABS 0.3 02/02/2023   EOSABS 0.0 02/02/2023   BASOSABS 0.0 02/02/2023     Last metabolic panel Lab Results  Component Value Date   NA 137 02/26/2023   K 4.0 February 26, 2023   CL 101 February 26, 2023   CO2 26 02/26/2023   BUN 12 2023/02/26   CREATININE 0.84 02/26/23   GLUCOSE 126 (H) 2023-02-26   GFRNONAA >60 02-26-2023   CALCIUM 9.0 02/26/2023   PROT 6.6 February 26, 2023   ALBUMIN 3.1 (L) 02/26/2023   BILITOT 5.1 (H) 02-26-2023   ALKPHOS 93 2023-02-26   AST 118 (H) 02-26-23   ALT 107 (H) 02-26-2023   ANIONGAP 10 02/26/23    CBG (last 3)  Recent Labs    02/02/23 2141 02/26/2023 0634 February 26, 2023 1027  GLUCAP 163* 158* 138*      Coagulation Profile: No results for input(s): "INR", "PROTIME" in the last 168 hours.   Radiology Studies: I have personally reviewed the imaging studies  EEG adult  Result Date: 02-26-23 Charlsie Quest, MD     February 26, 2023  8:53 AM Patient Name: DARWIN CALDRON MRN: [id number] Epilepsy Attending: Charlsie Quest Referring Physician/Provider: Cathren Harsh, MD  Date: February 26, 2023 Duration: 23.22 mins Patient history: 77yo F with ams getting eeg to evaluate for seizure. Level of alertness: Awake, asleep AEDs during EEG study: GBP, Ativan Technical aspects: This EEG study was done with scalp electrodes positioned according to the 10-20 International system of electrode placement. Electrical activity was reviewed with band pass filter of 1-70Hz , sensitivity of 7 uV/mm, display speed of 93mm/sec with a 60Hz  notched filter applied as appropriate. EEG data were recorded continuously and digitally stored.  Video monitoring was available and reviewed as appropriate. Description: The posterior dominant rhythm consists of 7 Hz activity of moderate voltage (25-35 uV) seen predominantly in posterior head regions, symmetric and reactive to eye opening and eye closing. Sleep was characterized by vertex waves, sleep spindles (12 to 14 Hz), maximal frontocentral region. EEG showed continuous generalized predominantly 5 to 6 Hz theta slowing admixed with intermittent generalized 2-3Hz  delta slowing. Hyperventilation and photic stimulation were not performed.   ABNORMALITY - Continuous slow, generalized - Background slow IMPRESSION: This study is suggestive of moderate diffuse encephalopathy, nonspecific etiology. No seizures or epileptiform discharges were seen throughout the recording. Charlsie Quest   MR BRAIN WO CONTRAST  Result Date: 02/02/2023 CLINICAL DATA:  Altered mental status, NPH with shunt, unwitnessed fall EXAM: MRI HEAD WITHOUT CONTRAST TECHNIQUE: Multiplanar, multiecho pulse sequences of the brain and surrounding structures were obtained without intravenous contrast. COMPARISON:  03/29/2014 MRI head, correlation is made with CT head 02/02/2023 and 10/27/2022 FINDINGS: Evaluation is limited by motion, as well as susceptibility from the patient's shunt hardware. Brain: Right parietal approach ventriculostomy catheter, which is better seen on the same-day CT, terminating in  the third ventricle. Unchanged size and configuration of the ventricles compared to the most recent MRI and the 10/27/2022 CT. No acute infarct, hemorrhage, mass, mass effect, or midline shift. No extra-axial collection. Again noted is periventricular T2 hyperintense signal, which may be related to transependymal CSF resorption and/or chronic small vessel ischemic disease. Remote lacunar  infarct in the right lateral thalamus, which is new from the prior MRI but unchanged compared to the 10/27/2022 CT. Vascular: Normal arterial flow voids. Skull and upper cervical spine: Normal marrow signal. Hyperostosis frontalis. Sinuses/Orbits: No acute finding. Status post bilateral lens replacements. Other: Fluid in left mastoid air cells. IMPRESSION: 1. Evaluation is limited by motion and susceptibility from the patient's shunt hardware. Within this limitation, no acute intracranial process. 2. Unchanged size and configuration of the ventricles compared to the 2015 MRI and 10/27/2022 CT. 3. Unchanged periventricular T2 hyperintense signal, which may be related to transependymal CSF resorption and/or chronic small vessel ischemic disease. 4. 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 Electronically Signed   By: Wiliam Ke M.D.   On: 02/02/2023 19:40   DG HIP UNILAT WITH PELVIS 2-3 VIEWS RIGHT  Result Date: 02/02/2023 CLINICAL DATA:  Fall, dementia EXAM: DG HIP (WITH OR WITHOUT PELVIS) 2-3V RIGHT COMPARISON:  Abdominal radiograph 02/02/2023 FINDINGS: There is no evidence of hip fracture or dislocation. There is no evidence of arthropathy or other focal bone abnormality. IMPRESSION: Negative. Electronically Signed   By: Gaylyn Rong M.D.   On: 02/02/2023 15:27   DG HIP UNILAT WITH PELVIS 2-3 VIEWS LEFT  Result Date: 02/02/2023 CLINICAL DATA:  Fall at nursing. Nonverbal patient with disorientation. History of dementia. EXAM: DG HIP  (WITH OR WITHOUT PELVIS) 2-3V LEFT COMPARISON:  Abdomen radiograph 02/02/2023 FINDINGS: VP shunt tubing terminates in the pelvis. No fracture or acute bony findings noted. IMPRESSION: 1. No acute bony findings. 2. VP shunt tubing terminates in the pelvis. Electronically Signed   By: Gaylyn Rong M.D.   On: 02/02/2023 15:27   CT Cervical Spine Wo Contrast  Result Date: 02/02/2023 CLINICAL DATA:  Neck trauma (Age >= 65y) EXAM: CT CERVICAL SPINE WITHOUT CONTRAST TECHNIQUE: Multidetector CT imaging of the cervical spine was performed without intravenous contrast. Multiplanar CT image reconstructions were also generated. RADIATION DOSE REDUCTION: This exam was performed according to the departmental dose-optimization program which includes automated exposure control, adjustment of the mA and/or kV according to patient size and/or use of iterative reconstruction technique. COMPARISON:  CT C Spine 06/16/22 FINDINGS: Limitations: Motion degraded exam. Degree of motion artifact limits assessment of the mid and lower cervical spine. Alignment: Normal. Skull base and vertebrae: Within the limitations of a significantly motion degraded exam, definite evidence of a cervical spine fracture. Soft tissues and spinal canal: No prevertebral fluid or swelling. No visible canal hematoma. Disc levels:  No evidence of high-grade spinal canal stenosis Upper chest: Negative. Other: None IMPRESSION: Within the limitations of a significantly motion degraded exam, no definite evidence of a cervical spine fracture. Electronically Signed   By: Lorenza Cambridge M.D.   On: 02/02/2023 15:15   US Abdomen Limited RUQ (LIVER/GB)  Result Date: 02/02/2023 CLINICAL DATA:  Elevated LFTs EXAM: ULTRASOUND ABDOMEN LIMITED RIGHT UPPER QUADRANT COMPARISON:  02/02/2023 FINDINGS: Gallbladder: Prior cholecystectomy. Common bile duct: Diameter: 8 mm Liver: Heterogeneous echotexture with a slightly nodular contour as can be seen with cirrhosis. Portal  vein is patent on color Doppler imaging with normal direction of blood flow towards the liver. Other: None. IMPRESSION: 1. Heterogeneous echotexture with a slightly nodular contour as can be seen with cirrhosis. 2. Prior cholecystectomy. Electronically Signed   By: Elige Ko M.D.   On: 02/02/2023 14:40   CT Head Wo Contrast  Result Date: 02/02/2023 CLINICAL DATA:  Fall, dementia EXAM: CT HEAD WITHOUT CONTRAST TECHNIQUE: Contiguous axial images were obtained from the base of the skull through the vertex without intravenous contrast. RADIATION DOSE REDUCTION: This exam was performed according to the departmental dose-optimization program which includes automated exposure control, adjustment of the mA and/or kV according to patient size and/or use of iterative reconstruction technique. COMPARISON:  None Available. FINDINGS: Evaluation is limited by motion. Brain: Right parietal approach ventriculostomy catheter with tip terminating third ventricle. Ventricular prominence appears commensurate with sulcal size. No evidence of acute infarction, hemorrhage, mass, mass effect, or midline shift. No extra-axial fluid collection. Periventricular white matter changes, likely the sequela of chronic small vessel ischemic disease. Vascular: No hyperdense vessel. Atherosclerotic calcifications in the intracranial carotid and vertebral arteries. Skull: Negative for fracture or focal lesion. Hyperostosis frontalis. Sinuses/Orbits: No acute finding. Other: The mastoid air cells are well aerated. Evaluation of the right scalp and neck soft tissue shunt tubing is limited by motion. IMPRESSION: 1. No acute intracranial process. 2. Right parietal approach ventriculostomy catheter with tip terminating third ventricle. Ventricular prominence appears commensurate with sulcal size. Prior images would be helpful to determine if hydrocephalus is present. Electronically Signed   By: Wiliam Ke M.D.   On: 02/02/2023 12:36   DG Cervical  Spine 1 View  Result Date: 02/02/2023 CLINICAL DATA:  Shunt malfunction EXAM: DG CERVICAL SPINE - 1 VIEW; ABDOMEN - 1 VIEW; CHEST 1 VIEW; SKULL - 2 VIEW COMPARISON:  None Available. 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 There is gap posterior along the skull consistent with a area of reservoir otherwise contiguous shunt tubing seen along the right side of the neck extending posterior to anterior, superior to inferior. Tubing extends vertically along the midthorax and along the epigastric region of the abdomen. The catheter extends into the right upper quadrant and then courses back to the left and inferior with the tip along the mid left lateral hemipelvis. Again no areas of discontinuity along the course of the shunt tubing. Elsewhere, along the two views of the skull no displaced or depressed skull fracture. Grossly clear paranasal sinuses. Patient is near edentulous with some metallic hardware along the mandible. Degenerative changes seen along the spine. There is enlarged cardiopericardial silhouette with some vascular congestion and interstitial changes. No pneumothorax, effusion, consolidation. Calcified aorta. Slightly widened upper mediastinum. Gas is seen in nondilated loops of small and large bowel. No obstruction. Mild stool. Surgical clips in the right upper quadrant. No obvious free air on this supine radiograph. 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 Enlarged heart with vascular congestion and interstitial changes. Acute versus chronic. Please correlate with any prior follow up. There also some widening of the mediastinum of uncertain etiology. Please correlate again with any prior or additional workup when appropriate. Nonspecific bowel gas pattern.  Electronically Signed   By: Karen Kays M.D.   On: 02/02/2023 12:14   DG Skull 1-3 Views  Result Date: 02/02/2023 CLINICAL DATA:  Shunt malfunction EXAM: DG CERVICAL SPINE - 1 VIEW; ABDOMEN - 1 VIEW; CHEST 1 VIEW; SKULL - 2 VIEW COMPARISON:  None Available. 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 There is gap posterior along the skull consistent with a area of reservoir otherwise  contiguous shunt tubing seen along the right side of the neck extending posterior to anterior, superior to inferior. Tubing extends vertically along the midthorax and along the epigastric region of the abdomen. The catheter extends into the right upper quadrant and then courses back to the left and inferior with the tip along the mid left lateral hemipelvis. Again no areas of discontinuity along the course of the shunt tubing. Elsewhere, along the two views of the skull no displaced or depressed skull fracture. Grossly clear paranasal sinuses. Patient is near edentulous with some metallic hardware along the mandible. Degenerative changes seen along the spine. There is enlarged cardiopericardial silhouette with some vascular congestion and interstitial changes. No pneumothorax, effusion, consolidation. Calcified aorta. Slightly widened upper mediastinum. Gas is seen in nondilated loops of small and large bowel. No obstruction. Mild stool. Surgical clips in the right upper quadrant. No obvious free air on this supine radiograph. 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 Enlarged heart with vascular congestion and interstitial changes. Acute versus chronic. Please correlate with any prior follow up. There also some widening of the mediastinum of uncertain etiology. Please correlate again with any prior or  additional workup when appropriate. Nonspecific bowel gas pattern. Electronically Signed   By: Karen Kays M.D.   On: 02/02/2023 12:14   DG Chest 1 View  Result Date: 02/02/2023 CLINICAL DATA:  Shunt malfunction EXAM: DG CERVICAL SPINE - 1 VIEW; ABDOMEN - 1 VIEW; CHEST 1 VIEW; SKULL - 2 VIEW COMPARISON:  None Available. 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 There is gap posterior along the skull consistent with a area of reservoir otherwise contiguous shunt tubing seen along the right side of the neck extending posterior to anterior, superior to inferior. Tubing extends vertically along the midthorax and along the epigastric region of the abdomen. The catheter extends into the right upper quadrant and then courses back to the left and inferior with the tip along the mid left lateral hemipelvis. Again no areas of discontinuity along the course of the shunt tubing. Elsewhere, along the two views of the skull no displaced or depressed skull fracture. Grossly clear paranasal sinuses. Patient is near edentulous with some metallic hardware along the mandible. Degenerative changes seen along the spine. There is enlarged cardiopericardial silhouette with some vascular congestion and interstitial changes. No pneumothorax, effusion, consolidation. Calcified aorta. Slightly widened upper mediastinum. Gas is seen in nondilated loops of small and large bowel. No obstruction. Mild stool. Surgical clips in the right upper quadrant. No obvious free air on this supine radiograph. 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 Enlarged heart with vascular congestion and interstitial changes. Acute versus chronic. Please correlate with any prior follow up. There also some widening of the mediastinum  of uncertain etiology. Please correlate again with any prior or additional workup when appropriate. Nonspecific bowel gas pattern. Electronically Signed   By: Karen Kays M.D.   On: 02/02/2023 12:14   DG Abd 1 View  Result Date: 02/02/2023 CLINICAL DATA:  Shunt malfunction EXAM: DG CERVICAL SPINE - 1 VIEW; ABDOMEN - 1 VIEW; CHEST 1 VIEW; SKULL - 2 VIEW  COMPARISON:  None Available. 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 There is gap posterior along the skull consistent with a area of reservoir otherwise contiguous shunt tubing seen along the right side of the neck extending posterior to anterior, superior to inferior. Tubing extends vertically along the midthorax and along the epigastric region of the abdomen. The catheter extends into the right upper quadrant and then courses back to the left and inferior with the tip along the mid left lateral hemipelvis. Again no areas of discontinuity along the course of the shunt tubing. Elsewhere, along the two views of the skull no displaced or depressed skull fracture. Grossly clear paranasal sinuses. Patient is near edentulous with some metallic hardware along the mandible. Degenerative changes seen along the spine. There is enlarged cardiopericardial silhouette with some vascular congestion and interstitial changes. No pneumothorax, effusion, consolidation. Calcified aorta. Slightly widened upper mediastinum. Gas is seen in nondilated loops of small and large bowel. No obstruction. Mild stool. Surgical clips in the right upper quadrant. No obvious free air on this supine radiograph. 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 Enlarged heart with vascular congestion and interstitial changes. Acute versus chronic. Please correlate with  any prior follow up. There also some widening of the mediastinum of uncertain etiology. Please correlate again with any prior or additional workup when appropriate. Nonspecific bowel gas pattern. Electronically Signed   By: Karen Kays M.D.   On: 02/02/2023 12:14       Gionna Polak M.D. [facility] 02/03/2023, 11:27 AM  Available via Epic secure chat 7am-7pm After 7 pm, please refer to night coverage provider listed on amion.

## 2023-02-03 NOTE — Evaluation (Signed)
Physical Therapy Evaluation Patient Details Name: Erin Shaw MRN: [id number] DOB: Apr 14, 1946 Today's Date: 02/03/2023  History of Present Illness  77yo female  admitted from SNF 7/3 after a fall and worsening mental status. PMHx: NPH s/p shunt, progressive neurodegenerative ds, NASH, anxiety dementia, hx of recurrent UTI's.  Clinical Impression  Pt admitted with above diagnosis. Oriented to self, month/year, location, and recalls that she fell out of bed, but unsure why she is in the hospital. States Rt thigh and abdomen are sore. Able to bear weight and sidestep along bed with min assist but reports increase in Rt thigh pain with steps. Requires cues for to facilitate and sequence with simple activities; is easily distracted. Pleasant and cooperative. Very HOH. Reports she transfers and toilets herself at facility but uses a w/c to get out of room and staff assist with bathing. Per RN, pt could not answer these simple questions at all, about 1.5hr prior to my evaluation. RN did witness improved change in cognitive status. Pt currently with functional limitations due to the deficits listed below (see PT Problem List). Pt will benefit from acute skilled PT to increase their independence and safety with mobility to allow discharge.           Assistance Recommended at Discharge Frequent or constant Supervision/Assistance  If plan is discharge home, recommend the following:  Can travel by private vehicle  A little help with walking and/or transfers;A lot of help with bathing/dressing/bathroom;Assistance with cooking/housework;Direct supervision/assist for medications management;Direct supervision/assist for financial management;Assist for transportation   No (possibly soon)    Equipment Recommendations None recommended by PT  Recommendations for Other Services       Functional Status Assessment Patient has had a recent decline in their functional status and demonstrates the ability to  make significant improvements in function in a reasonable and predictable amount of time.     Precautions / Restrictions Precautions Precautions: Fall Restrictions Weight Bearing Restrictions: No      Mobility  Bed Mobility Overal bed mobility: Needs Assistance Bed Mobility: Supine to Sit, Sit to Supine     Supine to sit: Min guard Sit to supine: Min assist   General bed mobility comments: Min guard to rise to EOB, requries VC for sequencing and redirection. Min assist for LEs into bed and for proper alignment.    Transfers Overall transfer level: Needs assistance Equipment used: 1 person hand held assist Transfers: Sit to/from Stand Sit to Stand: Min assist           General transfer comment: Min assist for boost and balance, cues for technique. Once upright able to side step along bed with VC for sequencing and direction, with min assist for balance.    Ambulation/Gait                  Stairs            Wheelchair Mobility     Tilt Bed    Modified Rankin (Stroke Patients Only)       Balance Overall balance assessment: Needs assistance Sitting-balance support: Feet supported, No upper extremity supported Sitting balance-Leahy Scale: Fair     Standing balance support: Single extremity supported Standing balance-Leahy Scale: Poor                               Pertinent Vitals/Pain Pain Assessment Pain Assessment: Faces Faces Pain Scale: Hurts little more Pain Location: Rt thigh, Abdomen  Pain Descriptors / Indicators: Sore Pain Intervention(s): Monitored during session, Repositioned    Home Living Family/patient expects to be discharged to:: Skilled nursing facility                   Additional Comments: States she was not receiving therapy at [facility]. States she has a walker but usually gets into her w/c by herself and does not walk. Reports she can navigate W/c alone.    Prior Function Prior Level of  Function : Needs assist;History of Falls (last six months)             Mobility Comments: Pt reports she transfers herself into w/c and is able to self propel ADLs Comments: Reports she toilets independently but staff assist with bathing. States she can dress herself, but this answer took a while to confirm.     Hand Dominance   Dominant Hand: Right    Extremity/Trunk Assessment   Upper Extremity Assessment Upper Extremity Assessment: Defer to OT evaluation    Lower Extremity Assessment Lower Extremity Assessment: Generalized weakness (TTP Rt anterior and lateral thigh, soft tissue. Able to move limbs volitionally and fully bear weight, but reports pain with WB on Rt.)       Communication   Communication: HOH  Cognition Arousal/Alertness: Awake/alert Behavior During Therapy: Restless Overall Cognitive Status: No family/caregiver present to determine baseline cognitive functioning                                 General Comments: Oriented to self, place, month and year. She recalls rolling out of bed at [facility] and hitting her head. Delayed processing.  Fidgeting in bed, removing clothes. Very HOH.        General Comments      Exercises     Assessment/Plan    PT Assessment Patient needs continued PT services  PT Problem List Decreased strength;Decreased activity tolerance;Decreased balance;Decreased mobility;Decreased coordination;Decreased cognition;Decreased knowledge of use of DME;Decreased knowledge of precautions;Decreased safety awareness;Pain       PT Treatment Interventions DME instruction;Gait training;Functional mobility training;Therapeutic activities;Therapeutic exercise;Balance training;Neuromuscular re-education;Cognitive remediation;Patient/family education    PT Goals (Current goals can be found in the Care Plan section)  Acute Rehab PT Goals Patient Stated Goal: None stated PT Goal Formulation: With patient Time For Goal  Achievement: 02/17/23 Potential to Achieve Goals: Fair    Frequency Min 2X/week     Co-evaluation               AM-PAC PT "6 Clicks" Mobility  Outcome Measure Help needed turning from your back to your side while in a flat bed without using bedrails?: None Help needed moving from lying on your back to sitting on the side of a flat bed without using bedrails?: A Little Help needed moving to and from a bed to a chair (including a wheelchair)?: A Little Help needed standing up from a chair using your arms (e.g., wheelchair or bedside chair)?: A Little Help needed to walk in hospital room?: A Lot Help needed climbing 3-5 steps with a railing? : Total 6 Click Score: 16    End of Session Equipment Utilized During Treatment: Gait belt Activity Tolerance: Patient tolerated treatment well Patient left: in bed;with call bell/phone within reach;with bed alarm set Nurse Communication: Mobility status PT Visit Diagnosis: Unsteadiness on feet (R26.81);Muscle weakness (generalized) (M62.81);History of falling (Z91.81);Difficulty in walking, not elsewhere classified (R26.2);Other symptoms and signs  involving the nervous system (R29.898);Pain Pain - Right/Left: Right Pain - part of body:  (Thigh and abdomen)    Time: 4166-0630 PT Time Calculation (min) (ACUTE ONLY): 22 min   Charges:   PT Evaluation $PT Eval Low Complexity: 1 Low   PT General Charges $$ ACUTE PT VISIT: 1 Visit         Kathlyn Sacramento, PT, DPT [facility]  Rehabilitation Services Physical Therapist Office: [phone] Website: [email or web address]   Berton Mount 02/03/2023, 11:25 AM

## 2023-02-03 NOTE — Procedures (Signed)
Patient Name: Erin Shaw  MRN: [id number]  Epilepsy Attending: Charlsie Quest  Referring Physician/Provider: Cathren Harsh, MD  Date: 02/03/2023 Duration: 23.22 mins  Patient history: 77yo F with ams getting eeg to evaluate for seizure.  Level of alertness: Awake, asleep  AEDs during EEG study: GBP, Ativan  Technical aspects: This EEG study was done with scalp electrodes positioned according to the 10-20 International system of electrode placement. Electrical activity was reviewed with band pass filter of 1-70Hz , sensitivity of 7 uV/mm, display speed of 39mm/sec with a 60Hz  notched filter applied as appropriate. EEG data were recorded continuously and digitally stored.  Video monitoring was available and reviewed as appropriate.  Description: The posterior dominant rhythm consists of 7 Hz activity of moderate voltage (25-35 uV) seen predominantly in posterior head regions, symmetric and reactive to eye opening and eye closing. Sleep was characterized by vertex waves, sleep spindles (12 to 14 Hz), maximal frontocentral region. EEG showed continuous generalized predominantly 5 to 6 Hz theta slowing admixed with intermittent generalized 2-3Hz  delta slowing. Hyperventilation and photic stimulation were not performed.     ABNORMALITY - Continuous slow, generalized - Background slow  IMPRESSION: This study is suggestive of moderate diffuse encephalopathy, nonspecific etiology. No seizures or epileptiform discharges were seen throughout the recording.  [name] [name]

## 2023-02-03 NOTE — Progress Notes (Signed)
EEG complete - results pending 

## 2023-02-03 NOTE — Evaluation (Signed)
Occupational Therapy Evaluation Patient Details Name: Erin Shaw MRN: [id number] DOB: [birth year] Today's Date: 02/03/2023   History of Present Illness 76yo female  admitted from SNF 7/3 after a fall and worsening mental status. PMHx: NPH s/p shunt, progressive neurodegenerative ds, NASH, anxiety dementia, hx of recurrent UTI's.   Clinical Impression   This 77 yo female admitted with above presents to acute OT with PLOF per her report of being able to toilet herself from a wheelchair level and get herself dressed, but needs A for bathing. Currently she is setup/S-max A for basic ADLs and was mod A to some up to partial standing at the EOB. She will continue to benefit from acute OT with follow up from continued inpatient follow up therapy, <3 hours/day.       Recommendations for follow up therapy are one component of a multi-disciplinary discharge planning process, led by the attending physician.  Recommendations may be updated based on patient status, additional functional criteria and insurance authorization.   Assistance Recommended at Discharge Frequent or constant Supervision/Assistance  Patient can return home with the following A lot of help with walking and/or transfers;A lot of help with bathing/dressing/bathroom;Assistance with cooking/housework;Help with stairs or ramp for entrance;Assist for transportation;Direct supervision/assist for financial management;Direct supervision/assist for medications management    Functional Status Assessment  Patient has had a recent decline in their functional status and demonstrates the ability to make significant improvements in function in a reasonable and predictable amount of time.  Equipment Recommendations  None recommended by OT       Precautions / Restrictions Precautions Precautions: Fall Restrictions Weight Bearing Restrictions: No      Mobility Bed Mobility Overal bed mobility: Needs Assistance Bed Mobility: Supine to  Sit, Sit to Supine     Supine to sit: Min guard Sit to supine: Min guard        Transfers Overall transfer level: Needs assistance Equipment used: 1 person hand held assist Transfers: Sit to/from Stand Sit to Stand: Mod assist                  Balance Overall balance assessment: Needs assistance Sitting-balance support: No upper extremity supported, Feet supported Sitting balance-Leahy Scale: Good     Standing balance support: Single extremity supported Standing balance-Leahy Scale: Poor                             ADL either performed or assessed with clinical judgement   ADL Overall ADL's : Needs assistance/impaired Eating/Feeding: Set up;Supervision/ safety;Sitting Eating/Feeding Details (indicate cue type and reason): EOB Grooming: Wash/dry face;Set up;Supervision/safety;Sitting Grooming Details (indicate cue type and reason): EOB Upper Body Bathing: Minimal assistance;Sitting Upper Body Bathing Details (indicate cue type and reason): EOB Lower Body Bathing: Maximal assistance Lower Body Bathing Details (indicate cue type and reason): Mod A sit<>stand at EOB Upper Body Dressing : Minimal assistance;Sitting Upper Body Dressing Details (indicate cue type and reason): EOB Lower Body Dressing: Maximal assistance Lower Body Dressing Details (indicate cue type and reason): Mod A sit<>stand at EOB                     Vision Patient Visual Report: No change from baseline              Pertinent Vitals/Pain Pain Assessment Pain Assessment: Faces Faces Pain Scale: Hurts little more Pain Location: Bil thighs and abdomen Pain Descriptors / Indicators: Sore Pain Intervention(s): Limited activity  within patient's tolerance, Monitored during session, Repositioned     Hand Dominance Right   Extremity/Trunk Assessment Upper Extremity Assessment Upper Extremity Assessment: Overall WFL for tasks assessed           Communication  Communication Communication: HOH   Cognition Arousal/Alertness: Awake/alert Behavior During Therapy: Restless Overall Cognitive Status: History of cognitive impairments - at baseline                                 General Comments: Very fidgety in bed, taking mitts off. Very HOH.                Home Living Family/patient expects to be discharged to:: Skilled nursing facility   Available Help at Discharge: Skilled Nursing Facility;Available 24 hours/day Type of Home: Skilled Nursing Facility                           Additional Comments: States she was not receiving therapy at [facility]. States she has a walker but usually gets into her w/c by herself and does not walk. Reports she can navigate W/c alone.      Prior Functioning/Environment Prior Level of Function : Needs assist;History of Falls (last six months)             Mobility Comments: Pt reports she transfers herself into w/c and is able to self propel ADLs Comments: Reports she toilets independently but staff assist with bathing. States she can dress herself.        OT Problem List: Impaired balance (sitting and/or standing);Obesity;Pain         OT Goals(Current goals can be found in the care plan section) Acute Rehab OT Goals Patient Stated Goal: to get IV out of her right arm OT Goal Formulation: With patient Time For Goal Achievement: 02/17/23 Potential to Achieve Goals: Fair         AM-PAC OT "6 Clicks" Daily Activity     Outcome Measure Help from another person eating meals?: A Little Help from another person taking care of personal grooming?: A Little Help from another person toileting, which includes using toliet, bedpan, or urinal?: A Lot Help from another person bathing (including washing, rinsing, drying)?: A Lot Help from another person to put on and taking off regular upper body clothing?: A Little Help from another person to put on and taking off regular lower  body clothing?: A Lot 6 Click Score: 15   End of Session Equipment Utilized During Treatment: Gait belt Nurse Communication:  (Pt requesting xanax)  Activity Tolerance: Patient tolerated treatment well Patient left: in bed;with call bell/phone within reach;with bed alarm set;with restraints reapplied  OT Visit Diagnosis: Unsteadiness on feet (R26.81);Other abnormalities of gait and mobility (R26.89);Muscle weakness (generalized) (M62.81);Other symptoms and signs involving cognitive function;Pain                Time: 1452-1520 OT Time Calculation (min): 28 min Charges:  OT General Charges $OT Visit: 1 Visit OT Evaluation $OT Eval Moderate Complexity: 1 Mod OT Treatments $Self Care/Home Management : 8-22 mins  Lindon Romp OT Acute Rehabilitation Services Office [phone]    Evette Georges 02/03/2023, 4:43 PM

## 2023-02-04 LAB — GLUCOSE, CAPILLARY
Glucose-Capillary: 123 mg/dL — ABNORMAL HIGH (ref 70–99)
Glucose-Capillary: 164 mg/dL — ABNORMAL HIGH (ref 70–99)
Glucose-Capillary: 120 mg/dL — ABNORMAL HIGH (ref 70–99)
Glucose-Capillary: 117 mg/dL — ABNORMAL HIGH (ref 70–99)

## 2023-02-04 LAB — BASIC METABOLIC PANEL
Anion gap: 7 (ref 5–15)
BUN: 12 mg/dL (ref 8–23)
CO2: 28 mmol/L (ref 22–32)
Calcium: 8.8 mg/dL — ABNORMAL LOW (ref 8.9–10.3)
Chloride: 101 mmol/L (ref 98–111)
Creatinine, Ser: 0.76 mg/dL (ref 0.44–1.00)
GFR, Estimated: >60 mL/min (ref >60)
Glucose, Bld: 126 mg/dL — ABNORMAL HIGH (ref 70–99)
Potassium: 3.3 mmol/L — ABNORMAL LOW (ref 3.5–5.1)
Sodium: 136 mmol/L (ref 135–145)

## 2023-02-04 LAB — CBC
HCT: 37.2 % (ref 36.0–46.0)
Hemoglobin: 12.8 g/dL (ref 12.0–15.0)
MCH: 30.8 pg (ref 26.0–34.0)
MCHC: 34.4 g/dL (ref 30.0–36.0)
MCV: 89.6 fL (ref 80.0–100.0)
Platelets: 124 10*3/uL — ABNORMAL LOW (ref 150–400)
RBC: 4.15 MIL/uL (ref 3.87–5.11)
RDW: 11.9 % (ref 11.5–15.5)
WBC: 4.4 10*3/uL (ref 4.0–10.5)
nRBC: 0 % (ref 0.0–0.2)

## 2023-02-04 LAB — URINE CULTURE

## 2023-02-04 LAB — CULTURE, BLOOD (ROUTINE X 2)

## 2023-02-04 LAB — PROCALCITONIN: Procalcitonin: 6.14 ng/mL

## 2023-02-04 MED ORDER — HYDRALAZINE HCL 20 MG/ML IJ SOLN
10.0000 mg | Freq: Four times a day (QID) | INTRAMUSCULAR | Status: DC | PRN
  Administered 2023-02-04: 10 mg via INTRAVENOUS
  Filled 2023-02-04: qty 1

## 2023-02-04 NOTE — NC FL2 (Signed)
[place] MEDICAID FL2 LEVEL OF CARE FORM     IDENTIFICATION  Patient Name: Erin Shaw Birthdate: 1946-07-11 Sex: female Admission Date (Current Location): 02/02/2023  [facility] [place] Number:  Producer, television/film/video and Address:  [facility] [place], [address] [place], [place] [address]      Provider Number: [id number]  Attending Physician Name and Address:  Cathren Harsh, MD  Relative Name and Phone Number:       Current Level of Care: Hospital Recommended Level of Care: Skilled Nursing Facility Prior Approval Number:    Date Approved/Denied:   PASRR Number:    Discharge Plan: SNF    Current Diagnoses: Patient Active Problem List   Diagnosis Date Noted   UTI (urinary tract infection) 02/03/2023   Bacteremia due to Klebsiella pneumoniae 02/03/2023   Neurodegenerative disorder (HCC) 02/03/2023   NPH (normal pressure hydrocephalus) (HCC) 02/03/2023   NASH (nonalcoholic steatohepatitis) 02/03/2023   Anxiety disorder 02/03/2023   B12 deficiency 02/03/2023   Controlled type 2 diabetes mellitus without complication, without long-term current use of insulin (HCC) 02/03/2023   Acute metabolic encephalopathy 02/02/2023    Orientation RESPIRATION BLADDER Height & Weight     Self, Situation, Place  Normal Incontinent Weight: 200 lb 9.9 oz (91 kg) Height:  5\' 6"  (167.6 cm)  BEHAVIORAL SYMPTOMS/MOOD NEUROLOGICAL BOWEL NUTRITION STATUS      Incontinent Diet (carb modified)  AMBULATORY STATUS COMMUNICATION OF NEEDS Skin   Extensive Assist Verbally Normal                       Personal Care Assistance Level of Assistance  Bathing, Feeding, Dressing Bathing Assistance: Maximum assistance Feeding assistance: Limited assistance Dressing Assistance: Maximum assistance     Functional Limitations Info  Speech, Hearing   Hearing Info: Impaired Speech Info: Impaired    SPECIAL CARE FACTORS FREQUENCY                        Contractures Contractures Info: Not present    Additional Factors Info  Code Status, Allergies, Psychotropic, Insulin Sliding Scale Code Status Info: DNR Allergies Info: Erythromycin, Penicillins, Topamax (Topiramate) Psychotropic Info: see DC summary Insulin Sliding Scale Info: see DC summary       Current Medications (02/04/2023):  This is the current hospital active medication list Current Facility-Administered Medications  Medication Dose Route Frequency Provider Last Rate Last Admin   acetaminophen (TYLENOL) tablet 650 mg  650 mg Oral Q6H PRN Russella Dar, NP   650 mg at 02/02/23 1659   Or   acetaminophen (TYLENOL) suppository 650 mg  650 mg Rectal Q6H PRN Russella Dar, NP       ARIPiprazole (ABILIFY) tablet 2 mg  2 mg Oral QHS Russella Dar, NP   2 mg at 02/03/23 2111   cefTRIAXone (ROCEPHIN) 2 g in sodium chloride 0.9 % 100 mL IVPB  2 g Intravenous Q24H Calton Dach I, RPH 200 mL/hr at 02/03/23 1112 2 g at 02/03/23 1112   cyanocobalamin (VITAMIN B12) injection 1,000 mcg  1,000 mcg Intramuscular Daily Rai, Ripudeep K, MD   1,000 mcg at 02/04/23 0831   diclofenac Sodium (VOLTAREN) 1 % topical gel 4 g  4 g Topical QID Russella Dar, NP   4 g at 02/04/23 0102   gabapentin (NEURONTIN) capsule 100 mg  100 mg Oral TID Russella Dar, NP   100 mg at 02/04/23 [phone]  hydrALAZINE (APRESOLINE) injection 10 mg  10 mg Intravenous Q6H PRN Rai, Ripudeep K, MD       insulin aspart (novoLOG) injection 0-5 Units  0-5 Units Subcutaneous QHS Russella Dar, NP       insulin aspart (novoLOG) injection 0-6 Units  0-6 Units Subcutaneous TID WC Russella Dar, NP   1 Units at 02/03/23 1651   lidocaine (XYLOCAINE) 5 % ointment 1 Application  1 Application Topical Daily Russella Dar, NP   1 Application at 02/04/23 0830   LORazepam (ATIVAN) tablet 1 mg  1 mg Oral TID Russella Dar, NP   1 mg at 02/04/23 0831   LORazepam (ATIVAN) tablet 1 mg  1 mg Oral Once Rai, Ripudeep K,  MD       ondansetron (ZOFRAN) tablet 4 mg  4 mg Oral Q6H PRN Russella Dar, NP       Or   ondansetron (ZOFRAN) injection 4 mg  4 mg Intravenous Q6H PRN Russella Dar, NP       Oral care mouth rinse  15 mL Mouth Rinse PRN Rai, Ripudeep K, MD       pantoprazole (PROTONIX) EC tablet 40 mg  40 mg Oral Daily Russella Dar, NP   40 mg at 02/04/23 9147   prazosin (MINIPRESS) capsule 2 mg  2 mg Oral QHS Russella Dar, NP   2 mg at 02/03/23 2111   sodium chloride flush (NS) 0.9 % injection 3 mL  3 mL Intravenous Q12H Russella Dar, NP   3 mL at 02/04/23 0832   venlafaxine XR (EFFEXOR-XR) 24 hr capsule 75 mg  75 mg Oral Q breakfast Russella Dar, NP   75 mg at 02/04/23 0831   zolpidem (AMBIEN) tablet 5 mg  5 mg Oral QHS Russella Dar, NP   5 mg at 02/03/23 2111     Discharge Medications: Please see discharge summary for a list of discharge medications.  Relevant Imaging Results:  Relevant Lab Results:   Additional Information SS#: [id number]  Baldemar Lenis, LCSW

## 2023-02-04 NOTE — Progress Notes (Signed)
Right parietal VPS interrogated, Strata valve reset to previous setting of 0.5  Lisbeth Renshaw, MD [facility]

## 2023-02-04 NOTE — TOC Progression Note (Signed)
Transition of Care [facility]) - Progression Note    Patient Details  Name: Erin Shaw MRN: [id number] Date of Birth: 10/11/1945  [facility] of [facility]) CM/SW Contact  Baldemar Lenis, [place] Phone Number: 02/04/2023, 11:32 AM  Clinical Narrative:   CSW spoke with Admissions at [facility] to discuss patient possible return over the weekend. SNF is able to accept patient back, Wynona Canes will be on call for admissions. CSW completed referral information and sent to [facility], updated that patient may need antibiotics for bacteremia. CSW to follow.    Expected Discharge Plan: Skilled Nursing Facility Barriers to Discharge: Continued Medical Work up  Expected Discharge Plan and Services     Post Acute Care Choice: Skilled Nursing Facility Living arrangements for the past 2 months: Skilled Nursing Facility                                       Social Determinants of Health (SDOH) Interventions SDOH Screenings   Food Insecurity: No Food Insecurity (02/02/2023)  Housing: Low Risk  (02/02/2023)  Transportation Needs: No Transportation Needs (02/02/2023)  Utilities: Not At Risk (02/02/2023)  Tobacco Use: Low Risk  (02/02/2023)    Readmission Risk Interventions     No data to display

## 2023-02-04 NOTE — Progress Notes (Signed)
Patient has hematoma on top of left hand and wrist. Notified MD, new order received.

## 2023-02-04 NOTE — Progress Notes (Signed)
[facility]                                                                              Cashlynn Weisberg, is a 77 y.o. female, DOB - [birth year], WUJ:[id number] Admit date - 02/02/2023    Outpatient Primary MD for the patient is Pcp, No  LOS - 2  days  Chief Complaint  Patient presents with   Fall       Brief summary   Patient is a 77 year old female with NPH status post programmable VP shunt, history of progressive neurodegenerative disease,, anxiety disorder, severe depression with prior psychotic episodes, dyslipidemia, HTN, insomnia, osteoarthritis, recurrent UTIs, DM type II, dementia presented to ED after a fall from SNF and altered mental status.  Patient apparently had been on hospice due to declining neurological function.  HPOA at bedside had reported that patient had been transferred to a new facility and had become more interactive with improved mentation.  At baseline it is reported that this patient is verbal. She does have severe anxiety and takes multiple medications to keep this under control.  Also was reported that patient had coffee-ground emesis prior to arrival.  Patient was found to the morning of admission, nursing staff on the floor, mentation had changed. She was unable to verbally respond or write any requested responses. Due to her difficulty in responding verbally it was unable to be appreciated whether she was actually disoriented or not.  In ED, she was minimally tachycardic and hypertensive, fidgety and anxious.  Moving all her extremities without difficulty.  Labs showed hypokalemia 2.9, glucose 230, AST 195, ALT 137.  Total bilirubin 6.5.  Prior history of cholecystectomy. Patient was admitted for further workup.   Assessment & Plan    Principal Problem:   Acute metabolic encephalopathy -Likely due to UTI and Klebsiella bacteremia -MRI of the brain showed no acute intracranial process, unchanged size and configuration of the ventricles  compared to previous MRI -Elevated procalcitonin, UA + UTI, started on IV Rocephin -Ammonia level normal, LFTs improving, total bilirubin improving, TSH 0.8 - B12 deficiency, started on B12 replacement -EEG showed moderate diffuse encephalopathy, nonspecific, no seizures or epileptiform discharges -Continue IV Rocephin for bacteremia, follow sensitivities -Mental status improving, appears close to her baseline.  Active Problems:   UTI (urinary tract infection) -UA positive for TI however culture shows multiple species -Continue to treat with IV Rocephin, due to bacteremia, follow sensitivities    Bacteremia due to Klebsiella pneumoniae -BCID positive for Klebsiella, UA positive for UTI -CT abdomen and pelvis showed no acute findings, mild right-sided bladder wall thickening and irregularity, rule out cystitis, outpatient urology follow-up to exclude neoplasm. -Continue IV Rocephin, follow sensitivities, repeat blood cultures - procalcitonin improving    Neurodegenerative disorder (HCC),   NPH (normal pressure hydrocephalus) (HCC) -Neurosurgery was consulted to reprogram the shunt.  Dr. Franky Macho was consulted prior to arrival and had mentioned that shunt will be reprogrammed in next 48 hours after the MRI.  I reached out to neurosurgery, Dr. Conchita Paris for confirmation if shunt needs to be still reprogrammed.    NASH (nonalcoholic steatohepatitis) -Known history of Nash, LFTs improving,  T. bili improving 6.5 on admission -Hold statin, Ultram -Abdominal ultrasound showed heterogenous echotexture of the liver as seen in the cirrhosis, prior cholecystectomy    Anxiety disorder with depression -Continue psych medications including prazosin, Pristiq, Ativan, Abilify    B12 deficiency -B12 178, placed on vitamin B12 1000 mcg IM daily, will transition to oral closer to discharge -Folic acid normal 9.5    Controlled type 2 diabetes mellitus without complication, without long-term current use  of insulin (HCC) -Continue sliding scale insulin while inpatient -Hemoglobin A1c 6.3 CBG (last 3)  Recent Labs    02/03/23 1538 02/03/23 2212 02/04/23 0623  GLUCAP 179* 107* 123*   Hematoma on the left wrist from IV line -Warm compresses, continue to monitor DC Lovenox  Obesity Estimated body mass index is 32.38 kg/m as calculated from the following:   Height as of this encounter: 5\' 6"  (1.676 m).   Weight as of this encounter: 91 kg.  Code Status: DNR DVT Prophylaxis:  Place and maintain sequential compression device Start: 02/04/23 1045   Level of Care: Level of care: Med-Surg Family Communication: Called patient's POA, Ms. Edrick Oh yesterday  Disposition Plan:      Remains inpatient appropriate: Awaiting blood cultures sensitivities, possible DC back to SNF if workup complete   Procedures:  EEG I brain  Consultants:   Neurosurgery  Antimicrobials:   Anti-infectives (From admission, onward)    Start     Dose/Rate Route Frequency Ordered Stop   02/03/23 1130  cefTRIAXone (ROCEPHIN) 2 g in sodium chloride 0.9 % 100 mL IVPB        2 g 200 mL/hr over 30 Minutes Intravenous Every 24 hours 02/03/23 1030            Medications  ARIPiprazole  2 mg Oral QHS   cyanocobalamin  1,000 mcg Intramuscular Daily   diclofenac Sodium  4 g Topical QID   gabapentin  100 mg Oral TID   insulin aspart  0-5 Units Subcutaneous QHS   insulin aspart  0-6 Units Subcutaneous TID WC   lidocaine  1 Application Topical Daily   LORazepam  1 mg Oral TID   LORazepam  1 mg Oral Once   pantoprazole  40 mg Oral Daily   prazosin  2 mg Oral QHS   sodium chloride flush  3 mL Intravenous Q12H   venlafaxine XR  75 mg Oral Q breakfast   zolpidem  5 mg Oral QHS      Subjective:   Myesha Moffatt was seen and examined today.  Alert, following commands, oriented, appears close to her baseline.  No expressive aphasia.  Complaining of urinary incontinence.  Patient denies dizziness,  chest pain, shortness of breath, N/V/D/C. Objective:   Vitals:   02/03/23 1900 02/03/23 2300 02/04/23 0500 02/04/23 0845  BP: (!) 135/93 (!) 144/92 (!) 163/86 (!) 159/75  Pulse: 87 83    Resp: 19 17 17    Temp: 99.1 F (37.3 C) 98.7 F (37.1 C) 97.9 F (36.6 C) 98.3 F (36.8 C)  TempSrc: Oral Axillary Axillary Oral  SpO2: 98% 93% 95% 98%  Weight:      Height:        Intake/Output Summary (Last 24 hours) at 02/04/2023 1114 Last data filed at 02/04/2023 0500 Gross per 24 hour  Intake 931.5 ml  Output 750 ml  Net 181.5 ml     Wt Readings from Last 3 Encounters:  02/02/23 91 kg   Physical Exam General: Mental status improving, speech  clear, following commands, much more alert and oriented Cardiovascular: S1 S2 clear, RRR.  Respiratory: CTAB, no wheezing Gastrointestinal: Soft, nontender, nondistended, NBS Ext: no pedal edema bilaterally Neuro: strength 5/5 in upper and lower extremities Skin: hematoma on left wrist Psych: appears close to her mental status     Data Reviewed:  I have personally reviewed following labs    CBC Lab Results  Component Value Date   WBC 4.4 02/04/2023   RBC 4.15 02/04/2023   HGB 12.8 02/04/2023   HCT 37.2 02/04/2023   MCV 89.6 02/04/2023   MCH 30.8 02/04/2023   PLT 124 (L) 02/04/2023   MCHC 34.4 02/04/2023   RDW 11.9 02/04/2023   LYMPHSABS 0.2 (L) 02/02/2023   MONOABS 0.3 02/02/2023   EOSABS 0.0 02/02/2023   BASOSABS 0.0 02/02/2023     Last metabolic panel Lab Results  Component Value Date   NA 136 02/04/2023   K 3.3 (L) 02/04/2023   CL 101 02/04/2023   CO2 28 02/04/2023   BUN 12 02/04/2023   CREATININE 0.76 02/04/2023   GLUCOSE 126 (H) 02/04/2023   GFRNONAA >60 02/04/2023   CALCIUM 8.8 (L) 02/04/2023   PROT 6.6 02/03/2023   ALBUMIN 3.1 (L) 02/03/2023   BILITOT 5.1 (H) 02/03/2023   ALKPHOS 93 02/03/2023   AST 118 (H) 02/03/2023   ALT 107 (H) 02/03/2023   ANIONGAP 7 02/04/2023    CBG (last 3)  Recent Labs     02/03/23 1538 02/03/23 2212 02/04/23 0623  GLUCAP 179* 107* 123*      Coagulation Profile: No results for input(s): "INR", "PROTIME" in the last 168 hours.   Radiology Studies: I have personally reviewed the imaging studies  CT ABDOMEN PELVIS WO CONTRAST  Result Date: 02/03/2023 CLINICAL DATA:  Abdominal pain.  To [id number] EXAM: CT ABDOMEN AND PELVIS WITHOUT CONTRAST TECHNIQUE: Multidetector CT imaging of the abdomen and pelvis was performed following the standard protocol without IV contrast. RADIATION DOSE REDUCTION: This exam was performed according to the departmental dose-optimization program which includes automated exposure control, adjustment of the mA and/or kV according to patient size and/or use of iterative reconstruction technique. COMPARISON:  None Available. FINDINGS: Lower chest: Chronic interstitial changes noted in both lung bases with trace dependent atelectasis. Small hiatal hernia noted. Hepatobiliary: No suspicious focal abnormality in the liver on this study without intravenous contrast. Subtle nodularity of liver contour raises the question of cirrhosis. Cholecystectomy. Common bile duct measures 9 mm, similar to prior. Pancreas: No focal mass lesion. No dilatation of the main duct. No intraparenchymal cyst. No peripancreatic edema. Spleen: No splenomegaly. No suspicious focal mass lesion. Adrenals/Urinary Tract: No adrenal nodule or mass. 2.5 cm lower pole cyst right kidney mildly progressive in the interval. No followup imaging is recommended. Left kidney unremarkable. No evidence for hydroureter. Mild right-sided bladder wall thickening and irregularity. Stomach/Bowel: Small hiatal hernia. Stomach decompressed. Duodenum is normally positioned as is the ligament of Treitz. Duodenal diverticulum noted. No small bowel wall thickening. No small bowel dilatation. The terminal ileum is normal. The appendix is not well visualized, but there is no edema or inflammation in the region of  the cecum. No gross colonic mass. No colonic wall thickening. Diverticuli are seen scattered along the entire length of the colon without CT findings of diverticulitis. Vascular/Lymphatic: There is mild atherosclerotic calcification of the abdominal aorta without aneurysm. There is no gastrohepatic or hepatoduodenal ligament lymphadenopathy. No retroperitoneal or mesenteric lymphadenopathy. No pelvic sidewall lymphadenopathy. Reproductive: Unremarkable. Other: No intraperitoneal free  fluid. Intraperitoneal catheter likely from VP shunt. Musculoskeletal: Small supraumbilical midline ventral hernia contains only fat. Gas bubble in the subcutaneous fat of the right lower anterior abdominal wall is probably from an injection. No worrisome lytic or sclerotic osseous abnormality. IMPRESSION: 1. No acute findings in the abdomen or pelvis. Specifically, no findings to explain the patient's history of abdominal pain. 2. Mild right-sided bladder wall thickening and irregularity. Correlation with urinalysis recommended to exclude cystitis. Urology follow-up likely warranted to exclude neoplasm. 3. Subtle nodularity of liver contour raises the question of cirrhosis. 4. Small hiatal hernia. 5. Small supraumbilical midline ventral hernia contains only fat. 6.  Aortic Atherosclerosis (ICD10-I70.0). Electronically Signed   By: Kennith Center M.D.   On: 02/03/2023 15:59   EEG adult  Result Date: 02/03/2023 Charlsie Quest, MD     02/03/2023  8:53 AM Patient Name: JAMILETH FEULNER MRN: [id number] Epilepsy Attending: Charlsie Quest Referring Physician/Provider: Cathren Harsh, MD Date: 02/03/2023 Duration: 23.22 mins Patient history: 77yo F with ams getting eeg to evaluate for seizure. Level of alertness: Awake, asleep AEDs during EEG study: GBP, Ativan Technical aspects: This EEG study was done with scalp electrodes positioned according to the 10-20 International system of electrode placement. Electrical activity was reviewed  with band pass filter of 1-70Hz , sensitivity of 7 uV/mm, display speed of 3mm/sec with a 60Hz  notched filter applied as appropriate. EEG data were recorded continuously and digitally stored.  Video monitoring was available and reviewed as appropriate. Description: The posterior dominant rhythm consists of 7 Hz activity of moderate voltage (25-35 uV) seen predominantly in posterior head regions, symmetric and reactive to eye opening and eye closing. Sleep was characterized by vertex waves, sleep spindles (12 to 14 Hz), maximal frontocentral region. EEG showed continuous generalized predominantly 5 to 6 Hz theta slowing admixed with intermittent generalized 2-3Hz  delta slowing. Hyperventilation and photic stimulation were not performed.   ABNORMALITY - Continuous slow, generalized - Background slow IMPRESSION: This study is suggestive of moderate diffuse encephalopathy, nonspecific etiology. No seizures or epileptiform discharges were seen throughout the recording. Charlsie Quest   MR BRAIN WO CONTRAST  Result Date: 02/02/2023 CLINICAL DATA:  Altered mental status, NPH with shunt, unwitnessed fall EXAM: MRI HEAD WITHOUT CONTRAST TECHNIQUE: Multiplanar, multiecho pulse sequences of the brain and surrounding structures were obtained without intravenous contrast. COMPARISON:  03/29/2014 MRI head, correlation is made with CT head 02/02/2023 and 10/27/2022 FINDINGS: Evaluation is limited by motion, as well as susceptibility from the patient's shunt hardware. Brain: Right parietal approach ventriculostomy catheter, which is better seen on the same-day CT, terminating in the third ventricle. Unchanged size and configuration of the ventricles compared to the most recent MRI and the 10/27/2022 CT. No acute infarct, hemorrhage, mass, mass effect, or midline shift. No extra-axial collection. Again noted is periventricular T2 hyperintense signal, which may be related to transependymal CSF resorption and/or chronic small  vessel ischemic disease. 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 Vascular: Normal arterial flow voids. Skull and upper cervical spine: Normal marrow signal. Hyperostosis frontalis. Sinuses/Orbits: No acute finding. Status post bilateral lens replacements. Other: Fluid in left mastoid air cells. IMPRESSION: 1. Evaluation is limited by motion and susceptibility from the patient's shunt hardware. Within this limitation, no acute intracranial process. 2. Unchanged size and configuration of the ventricles compared to the 2015 MRI and 10/27/2022 CT. 3. Unchanged periventricular T2 hyperintense signal, which  may be related to transependymal CSF resorption and/or chronic small vessel ischemic disease. 4. 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 Electronically Signed   By: Wiliam Ke M.D.   On: 02/02/2023 19:40   DG HIP UNILAT WITH PELVIS 2-3 VIEWS RIGHT  Result Date: 02/02/2023 CLINICAL DATA:  Fall, dementia EXAM: DG HIP (WITH OR WITHOUT PELVIS) 2-3V RIGHT COMPARISON:  Abdominal radiograph 02/02/2023 FINDINGS: There is no evidence of hip fracture or dislocation. There is no evidence of arthropathy or other focal bone abnormality. IMPRESSION: Negative. Electronically Signed   By: Gaylyn Rong M.D.   On: 02/02/2023 15:27   DG HIP UNILAT WITH PELVIS 2-3 VIEWS LEFT  Result Date: 02/02/2023 CLINICAL DATA:  Fall at nursing. Nonverbal patient with disorientation. History of dementia. EXAM: DG HIP (WITH OR WITHOUT PELVIS) 2-3V LEFT COMPARISON:  Abdomen radiograph 02/02/2023 FINDINGS: VP shunt tubing terminates in the pelvis. No fracture or acute bony findings noted. IMPRESSION: 1. No acute bony findings. 2. VP shunt tubing terminates in the pelvis. Electronically Signed   By: Gaylyn Rong M.D.   On: 02/02/2023 15:27   CT Cervical Spine Wo  Contrast  Result Date: 02/02/2023 CLINICAL DATA:  Neck trauma (Age >= 65y) EXAM: CT CERVICAL SPINE WITHOUT CONTRAST TECHNIQUE: Multidetector CT imaging of the cervical spine was performed without intravenous contrast. Multiplanar CT image reconstructions were also generated. RADIATION DOSE REDUCTION: This exam was performed according to the departmental dose-optimization program which includes automated exposure control, adjustment of the mA and/or kV according to patient size and/or use of iterative reconstruction technique. COMPARISON:  CT C Spine 06/16/22 FINDINGS: Limitations: Motion degraded exam. Degree of motion artifact limits assessment of the mid and lower cervical spine. Alignment: Normal. Skull base and vertebrae: Within the limitations of a significantly motion degraded exam, definite evidence of a cervical spine fracture. Soft tissues and spinal canal: No prevertebral fluid or swelling. No visible canal hematoma. Disc levels:  No evidence of high-grade spinal canal stenosis Upper chest: Negative. Other: None IMPRESSION: Within the limitations of a significantly motion degraded exam, no definite evidence of a cervical spine fracture. Electronically Signed   By: Lorenza Cambridge M.D.   On: 02/02/2023 15:15   US Abdomen Limited RUQ (LIVER/GB)  Result Date: 02/02/2023 CLINICAL DATA:  Elevated LFTs EXAM: ULTRASOUND ABDOMEN LIMITED RIGHT UPPER QUADRANT COMPARISON:  02/02/2023 FINDINGS: Gallbladder: Prior cholecystectomy. Common bile duct: Diameter: 8 mm Liver: Heterogeneous echotexture with a slightly nodular contour as can be seen with cirrhosis. Portal vein is patent on color Doppler imaging with normal direction of blood flow towards the liver. Other: None. IMPRESSION: 1. Heterogeneous echotexture with a slightly nodular contour as can be seen with cirrhosis. 2. Prior cholecystectomy. Electronically Signed   By: Elige Ko M.D.   On: 02/02/2023 14:40   CT Head Wo Contrast  Result Date:  02/02/2023 CLINICAL DATA:  Fall, dementia EXAM: CT HEAD WITHOUT CONTRAST TECHNIQUE: Contiguous axial images were obtained from the base of the skull through the vertex without intravenous contrast. RADIATION DOSE REDUCTION: This exam was performed according to the departmental dose-optimization program which includes automated exposure control, adjustment of the mA and/or kV according to patient size and/or use of iterative reconstruction technique. COMPARISON:  None Available. FINDINGS: Evaluation is limited by motion. Brain: Right parietal approach ventriculostomy catheter with tip terminating third ventricle. Ventricular prominence appears commensurate with sulcal size. No evidence of acute infarction, hemorrhage, mass,  mass effect, or midline shift. No extra-axial fluid collection. Periventricular white matter changes, likely the sequela of chronic small vessel ischemic disease. Vascular: No hyperdense vessel. Atherosclerotic calcifications in the intracranial carotid and vertebral arteries. Skull: Negative for fracture or focal lesion. Hyperostosis frontalis. Sinuses/Orbits: No acute finding. Other: The mastoid air cells are well aerated. Evaluation of the right scalp and neck soft tissue shunt tubing is limited by motion. IMPRESSION: 1. No acute intracranial process. 2. Right parietal approach ventriculostomy catheter with tip terminating third ventricle. Ventricular prominence appears commensurate with sulcal size. Prior images would be helpful to determine if hydrocephalus is present. Electronically Signed   By: Wiliam Ke M.D.   On: 02/02/2023 12:36   DG Cervical Spine 1 View  Result Date: 02/02/2023 CLINICAL DATA:  Shunt malfunction EXAM: DG CERVICAL SPINE - 1 VIEW; ABDOMEN - 1 VIEW; CHEST 1 VIEW; SKULL - 2 VIEW COMPARISON:  None Available. 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 There is gap posterior along the skull consistent with a  area of reservoir otherwise contiguous shunt tubing seen along the right side of the neck extending posterior to anterior, superior to inferior. Tubing extends vertically along the midthorax and along the epigastric region of the abdomen. The catheter extends into the right upper quadrant and then courses back to the left and inferior with the tip along the mid left lateral hemipelvis. Again no areas of discontinuity along the course of the shunt tubing. Elsewhere, along the two views of the skull no displaced or depressed skull fracture. Grossly clear paranasal sinuses. Patient is near edentulous with some metallic hardware along the mandible. Degenerative changes seen along the spine. There is enlarged cardiopericardial silhouette with some vascular congestion and interstitial changes. No pneumothorax, effusion, consolidation. Calcified aorta. Slightly widened upper mediastinum. Gas is seen in nondilated loops of small and large bowel. No obstruction. Mild stool. Surgical clips in the right upper quadrant. No obvious free air on this supine radiograph. 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 Enlarged heart with vascular congestion and interstitial changes. Acute versus chronic. Please correlate with any prior follow up. There also some widening of the mediastinum of uncertain etiology. Please correlate again with any prior or additional workup when appropriate. Nonspecific bowel gas pattern. Electronically Signed   By: Karen Kays M.D.   On: 02/02/2023 12:14   DG Skull 1-3 Views  Result Date: 02/02/2023 CLINICAL DATA:  Shunt malfunction EXAM: DG CERVICAL SPINE - 1 VIEW; ABDOMEN - 1 VIEW; CHEST 1 VIEW; SKULL - 2 VIEW COMPARISON:  None Available. FINDINGS: VP shunt in place with enters the right occipital region extending anterior and  slightly medial. There is gap posterior along the skull consistent with a area of reservoir otherwise contiguous shunt tubing seen along the right side of the neck extending posterior to anterior, superior to inferior. Tubing extends vertically along the midthorax and along the epigastric region of the abdomen. The catheter extends into the right upper quadrant and then courses back to the left and inferior with the tip along the mid left lateral hemipelvis. Again no areas of discontinuity along the course of the shunt tubing. Elsewhere, along the two views of the skull no displaced or depressed skull fracture. Grossly clear paranasal sinuses. Patient is near edentulous with some  metallic hardware along the mandible. Degenerative changes seen along the spine. There is enlarged cardiopericardial silhouette with some vascular congestion and interstitial changes. No pneumothorax, effusion, consolidation. Calcified aorta. Slightly widened upper mediastinum. Gas is seen in nondilated loops of small and large bowel. No obstruction. Mild stool. Surgical clips in the right upper quadrant. No obvious free air on this supine radiograph. 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 Enlarged heart with vascular congestion and interstitial changes. Acute versus chronic. Please correlate with any prior follow up. There also some widening of the mediastinum of uncertain etiology. Please correlate again with any prior or additional workup when appropriate. Nonspecific bowel gas pattern. Electronically Signed   By: Karen Kays M.D.   On: 02/02/2023 12:14   DG Chest 1 View  Result Date: 02/02/2023 CLINICAL DATA:  Shunt malfunction EXAM: DG CERVICAL SPINE - 1 VIEW; ABDOMEN - 1 VIEW; CHEST 1 VIEW; SKULL - 2 VIEW COMPARISON:  None Available. FINDINGS: VP shunt in  place with enters the right occipital region extending anterior and slightly medial. There is gap posterior along the skull consistent with a area of reservoir otherwise contiguous shunt tubing seen along the right side of the neck extending posterior to anterior, superior to inferior. Tubing extends vertically along the midthorax and along the epigastric region of the abdomen. The catheter extends into the right upper quadrant and then courses back to the left and inferior with the tip along the mid left lateral hemipelvis. Again no areas of discontinuity along the course of the shunt tubing. Elsewhere, along the two views of the skull no displaced or depressed skull fracture. Grossly clear paranasal sinuses. Patient is near edentulous with some metallic hardware along the mandible. Degenerative changes seen along the spine. There is enlarged cardiopericardial silhouette with some vascular congestion and interstitial changes. No pneumothorax, effusion, consolidation. Calcified aorta. Slightly widened upper mediastinum. Gas is seen in nondilated loops of small and large bowel. No obstruction. Mild stool. Surgical clips in the right upper quadrant. No obvious free air on this supine radiograph. 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 Enlarged heart with vascular congestion and interstitial changes. Acute versus chronic. Please correlate with any prior follow up. There also some widening of the mediastinum of uncertain etiology. Please correlate again with any prior or additional workup when appropriate. Nonspecific bowel gas pattern. Electronically Signed   By: Karen Kays M.D.   On: 02/02/2023 12:14   DG Abd 1 View  Result Date: 02/02/2023 CLINICAL DATA:  Shunt malfunction EXAM: DG CERVICAL SPINE - 1 VIEW; ABDOMEN - 1 VIEW; CHEST 1 VIEW;  SKULL - 2 VIEW COMPARISON:  None Available. 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 There is gap posterior along the skull consistent with a area of reservoir otherwise contiguous shunt tubing seen along the right side of the neck extending posterior to anterior, superior to inferior. Tubing extends vertically along the midthorax and along the epigastric region of the abdomen. The catheter extends into the right upper quadrant and then courses back to the left and inferior with the tip along the mid left lateral hemipelvis. Again no  areas of discontinuity along the course of the shunt tubing. Elsewhere, along the two views of the skull no displaced or depressed skull fracture. Grossly clear paranasal sinuses. Patient is near edentulous with some metallic hardware along the mandible. Degenerative changes seen along the spine. There is enlarged cardiopericardial silhouette with some vascular congestion and interstitial changes. No pneumothorax, effusion, consolidation. Calcified aorta. Slightly widened upper mediastinum. Gas is seen in nondilated loops of small and large bowel. No obstruction. Mild stool. Surgical clips in the right upper quadrant. No obvious free air on this supine radiograph. 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 Enlarged heart with vascular congestion and interstitial changes. Acute versus chronic. Please correlate with any prior follow up. There also some widening of the mediastinum of uncertain etiology. Please correlate again with any prior or additional workup when appropriate. Nonspecific bowel gas pattern. Electronically Signed   By: Karen Kays M.D.   On: 02/02/2023 12:14       Anayla Giannetti M.D. [facility] 02/04/2023, 11:14 AM  Available  via Epic secure chat 7am-7pm After 7 pm, please refer to night coverage provider listed on amion.

## 2023-02-05 DIAGNOSIS — E119 Type 2 diabetes mellitus without complications: Secondary | ICD-10-CM

## 2023-02-05 LAB — CBC
HCT: 37.9 % (ref 36.0–46.0)
Hemoglobin: 13.2 g/dL (ref 12.0–15.0)
MCH: 30.8 pg (ref 26.0–34.0)
MCHC: 34.8 g/dL (ref 30.0–36.0)
MCV: 88.3 fL (ref 80.0–100.0)
Platelets: 149 10*3/uL — ABNORMAL LOW (ref 150–400)
RBC: 4.29 MIL/uL (ref 3.87–5.11)
RDW: 11.9 % (ref 11.5–15.5)
WBC: 4.5 10*3/uL (ref 4.0–10.5)
nRBC: 0 % (ref 0.0–0.2)

## 2023-02-05 LAB — BASIC METABOLIC PANEL
Anion gap: 11 (ref 5–15)
BUN: 11 mg/dL (ref 8–23)
CO2: 25 mmol/L (ref 22–32)
Calcium: 9 mg/dL (ref 8.9–10.3)
Chloride: 101 mmol/L (ref 98–111)
Creatinine, Ser: 0.58 mg/dL (ref 0.44–1.00)
GFR, Estimated: >60 mL/min (ref >60)
Glucose, Bld: 135 mg/dL — ABNORMAL HIGH (ref 70–99)
Potassium: 3.5 mmol/L (ref 3.5–5.1)
Sodium: 137 mmol/L (ref 135–145)

## 2023-02-05 LAB — CULTURE, BLOOD (ROUTINE X 2): Special Requests: ADEQUATE

## 2023-02-05 LAB — GLUCOSE, CAPILLARY
Glucose-Capillary: 114 mg/dL — ABNORMAL HIGH (ref 70–99)
Glucose-Capillary: 128 mg/dL — ABNORMAL HIGH (ref 70–99)
Glucose-Capillary: 129 mg/dL — ABNORMAL HIGH (ref 70–99)

## 2023-02-05 LAB — PROCALCITONIN: Procalcitonin: 3.7 ng/mL

## 2023-02-05 MED ORDER — CEFDINIR 300 MG PO CAPS: 300.0000 mg | ORAL_CAPSULE | Freq: Two times a day (BID) | ORAL | 0 refills | Status: AC

## 2023-02-05 MED ORDER — VITAMIN B-12 1000 MCG PO TABS: 1000.0000 ug | ORAL_TABLET | Freq: Every day | ORAL | 3 refills | Status: AC

## 2023-02-05 MED ORDER — ONDANSETRON HCL 4 MG PO TABS: 4.0000 mg | ORAL_TABLET | Freq: Four times a day (QID) | ORAL | 0 refills | Status: AC | PRN

## 2023-02-05 MED ORDER — LORAZEPAM 1 MG PO TABS: 1.0000 mg | ORAL_TABLET | Freq: Three times a day (TID) | ORAL | 0 refills | Status: AC

## 2023-02-05 NOTE — TOC Transition Note (Signed)
Transition of Care [facility]) - CM/SW Discharge Note   Patient Details  Name: Erin Shaw MRN: [id number] Date of Birth: [birth year]  [facility]) CM/SW Contact:  Deatra Robinson, [place] Phone Number: 02/05/2023, 2:49 PM   Clinical Narrative: Pt for dc back to [facility] today. Spoke to [place] in admissions who confirmed pt able to return. Per MD, pt's POA Bonnie updated and agreeable to dc. RN provided with number for report and PTAR arranged for transport. SW signing off at dc.   Dellie Burns, MSW, LCSW [phone] (coverage)        Final next level of care: Skilled Nursing Facility Barriers to Discharge: No Barriers Identified   Patient Goals and CMS Choice      Discharge Placement                Patient chooses bed at: Other - please specify in the comment section below: [facility]) Patient to be transferred to facility by: [facility] Name of family member notified: Bonnie/POA Patient and family notified of of transfer: 02/05/23  Discharge Plan and Services Additional resources added to the After Visit Summary for       Post Acute Care Choice: Skilled Nursing Facility                               Social Determinants of Health (SDOH) Interventions SDOH Screenings   Food Insecurity: No Food Insecurity (02/02/2023)  Housing: Low Risk  (02/02/2023)  Transportation Needs: No Transportation Needs (02/02/2023)  Utilities: Not At Risk (02/02/2023)  Tobacco Use: Low Risk  (02/02/2023)     Readmission Risk Interventions     No data to display

## 2023-02-05 NOTE — Discharge Summary (Signed)
Physician Discharge Summary   Patient: Erin Shaw MRN: [id number] DOB: March 04, 1946  Admit date:     02/02/2023  Discharge date: 02/05/23  Discharge Physician: Thad Ranger, MD    PCP: Pcp, No   Recommendations at discharge:   Continue B12 1000 mcg p.o. daily Cefdinir 300 mg p.o. twice daily for 4 more days to complete full course of 7 days Fall precautions Hold statin, tramadol, obtain LFTs in 1 week  Discharge Diagnoses:    Acute metabolic encephalopathy   UTI (urinary tract infection)   Bacteremia due to Klebsiella pneumoniae   Neurodegenerative disorder (HCC)   NPH (normal pressure hydrocephalus) (HCC)   NASH (nonalcoholic steatohepatitis)   Anxiety disorder   B12 deficiency   Controlled type 2 diabetes mellitus without complication, without long-term current use of insulin [facility])   Hospital Course:  Patient is a 77 year old female with NPH status post programmable VP shunt, history of progressive neurodegenerative disease,, anxiety disorder, severe depression with prior psychotic episodes, dyslipidemia, HTN, insomnia, osteoarthritis, recurrent UTIs, DM type II, dementia presented to ED after a fall from SNF and altered mental status.  Patient apparently had been on hospice due to declining neurological function.  HPOA at bedside had reported that patient had been transferred to a new facility and had become more interactive with improved mentation.  At baseline it is reported that this patient is verbal. She does have severe anxiety and takes multiple medications to keep this under control.  Also was reported that patient had coffee-ground emesis prior to arrival.   Patient was found to the morning of admission, nursing staff on the floor, mentation had changed. She was unable to verbally respond or write any requested responses. Due to her difficulty in responding verbally it was unable to be appreciated whether she was actually disoriented or not.  In ED, she was minimally  tachycardic and hypertensive, fidgety and anxious.  Moving all her extremities without difficulty.  Labs showed hypokalemia 2.9, glucose 230, AST 195, ALT 137.  Total bilirubin 6.5.  Prior history of cholecystectomy. Patient was admitted for further workup.   Assessment and Plan:  Acute metabolic encephalopathy -Likely due to UTI and Klebsiella bacteremia -MRI of the brain showed no acute intracranial process, unchanged size and configuration of the ventricles compared to previous MRI -Elevated procalcitonin, UA + UTI, started on IV Rocephin -Ammonia level normal, LFTs improving, total bilirubin improving, TSH 0.8 - B12 deficiency, started on B12 replacement -EEG showed moderate diffuse encephalopathy, nonspecific, no seizures or epileptiform discharges -Mental status improving, appears close to her baseline.      UTI (urinary tract infection) -UA positive for TI however culture shows multiple species -Patient was placed on IV Rocephin, has received 3 days of IV Rocephin. Blood cultures positive for Klebsiella, per sensitivities, placed on cefdinir 300 mg p.o. twice daily for 4 more days to complete full course for gram-negative bacteremia     Bacteremia due to Klebsiella pneumoniae -BCID positive for Klebsiella, UA positive for UTI -CT abdomen and pelvis showed no acute findings, mild right-sided bladder wall thickening and irregularity, rule out cystitis, outpatient urology follow-up to exclude neoplasm. -Patient was placed on IV Rocephin, repeat blood cultures negative. -Patient has received 3 days of IV Rocephin, transition to oral cefdinir 300 mg twice a day for 4 more days to complete full course of 7 days.     Neurodegenerative disorder (HCC),   NPH (normal pressure hydrocephalus) (HCC) -Neurosurgery was consulted to reprogram the shunt.   -Per  neurosurgery evaluation on 7/5, right parietal VPS interrogated, strata valve reset to previous setting of 0.5 by Dr. Conchita Paris.      NASH (nonalcoholic steatohepatitis) -Known history of Nash, LFTs improving, T. bili improving 6.5 on admission -Hold statin, Ultram -Abdominal ultrasound showed heterogenous echotexture of the liver as seen in the cirrhosis, prior cholecystectomy -Outpatient follow-up of LFTs in 1 week     Anxiety disorder with depression -Continue psych medications including prazosin, Ativan, Abilify     B12 deficiency -B12 178, placed on vitamin B12 1000 mcg daily -Folic acid normal 9.5     Controlled type 2 diabetes mellitus without complication, without long-term current use of insulin (HCC) -Patient was placed on sliding scale insulin while inpatient -Hemoglobin A1c 6.3    Obesity Estimated body mass index is 32.38 kg/m as calculated from the following:   Height as of this encounter: 5\' 6"  (1.676 m).   Weight as of this encounter: 91 kg.      Pain control - [employer] Controlled Substance Reporting System database was reviewed. and patient was instructed, not to drive, operate heavy machinery, perform activities at heights, swimming or participation in water activities or provide baby-sitting services while on Pain, Sleep and Anxiety Medications; until their outpatient Physician has advised to do so again. Also recommended to not to take more than prescribed Pain, Sleep and Anxiety Medications.  Consultants: Neuro surgery Procedures performed: MRI brain Disposition: Skilled nursing facility Diet recommendation:  Discharge Diet Orders (From admission, onward)     Start     Ordered   02/05/23 0000  Diet Carb Modified        02/05/23 1356            DISCHARGE MEDICATION: Allergies as of 02/05/2023       Reactions   Erythromycin Other (See Comments)   Reaction unknown   Penicillins Other (See Comments)   Reaction unknown   Topamax [topiramate] Other (See Comments)   Reaction unknown        Medication List     STOP taking these medications    atorvastatin 10 MG  tablet Commonly known as: LIPITOR   traMADol 50 MG tablet Commonly known as: ULTRAM       TAKE these medications    acetaminophen 325 MG tablet Commonly known as: TYLENOL Take 650 mg by mouth every 6 (six) hours as needed for mild pain, moderate pain or fever.   ARIPiprazole 2 MG tablet Commonly known as: ABILIFY Take 2 mg by mouth at bedtime.   cefdinir 300 MG capsule Commonly known as: OMNICEF Take 1 capsule (300 mg total) by mouth 2 (two) times daily for 4 days. Start taking on: February 06, 2023   cyanocobalamin 1000 MCG tablet Commonly known as: VITAMIN B12 Take 1 tablet (1,000 mcg total) by mouth daily.   desvenlafaxine 50 MG 24 hr tablet Commonly known as: PRISTIQ Take 50 mg by mouth daily.   gabapentin 100 MG capsule Commonly known as: NEURONTIN Take 100 mg by mouth 3 (three) times daily.   lidocaine 5 % ointment Commonly known as: XYLOCAINE Apply 1 Application topically daily. Take off after 12 hours.   LORazepam 1 MG tablet Commonly known as: ATIVAN Take 1 tablet (1 mg total) by mouth in the morning, at noon, and at bedtime.   nystatin powder Commonly known as: MYCOSTATIN/NYSTOP Apply 1 Application topically every 12 (twelve) hours as needed (Rash).   ondansetron 4 MG tablet Commonly known as: ZOFRAN Take 1 tablet (4  mg total) by mouth every 6 (six) hours as needed for nausea.   pantoprazole 40 MG tablet Commonly known as: PROTONIX Take 40 mg by mouth daily.   prazosin 2 MG capsule Commonly known as: MINIPRESS Take 2 mg by mouth at bedtime.   Salonpas 3.08-07-08 % Ptch Generic drug: Camphor-Menthol-Methyl Sal Apply 1 Application topically daily. Apply to both knees topically.   Spiriva Respimat 2.5 MCG/ACT Aers Generic drug: Tiotropium Bromide Monohydrate Inhale 2 each into the lungs daily.   Voltaren Arthritis Pain 1 % Gel Generic drug: diclofenac Sodium Apply 2-4 g topically in the morning, at noon, and at bedtime. Apply 2g to right shoulder  and 4g to left knee.   white petrolatum ointment Apply 1 Application topically at bedtime.   zolpidem 5 MG tablet Commonly known as: AMBIEN Take 5 mg by mouth at bedtime.        Contact information for after-discharge care     Destination     [facility] Preferred SNF .   Service: Skilled Nursing Contact information: [address] [place] [address] [phone]                    Discharge Exam: Ceasar Mons Weights   02/02/23 1025 02/02/23 1727  Weight: 77.1 kg 91 kg   S: Sitting up in the bed, eating lunch, no acute complaints.  Mental status fairly close to her baseline, pleasant.  BP (!) 164/95 (BP Location: Left Arm) Comment: RN notified  Pulse 77   Temp 98.1 F (36.7 C) (Oral)   Resp 18   Ht 5\' 6"  (1.676 m)   Wt 91 kg   SpO2 96%   BMI 32.38 kg/m   Physical Exam General:  improving, speech clear, following commands, pleasant Cardiovascular: S1 S2 clear, RRR.  Respiratory: CTAB, no wheezing, rales or rhonchi Gastrointestinal: Soft, nontender, nondistended, NBS Ext: no pedal edema bilaterally Neuro: no new deficits Psych: close to her baseline mental status   Condition at discharge: fair  The results of significant diagnostics from this hospitalization (including imaging, microbiology, ancillary and laboratory) are listed below for reference.   Imaging Studies: CT ABDOMEN PELVIS WO CONTRAST  Result Date: 02/03/2023 CLINICAL DATA:  Abdominal pain.  To [id number] EXAM: CT ABDOMEN AND PELVIS WITHOUT CONTRAST TECHNIQUE: Multidetector CT imaging of the abdomen and pelvis was performed following the standard protocol without IV contrast. RADIATION DOSE REDUCTION: This exam was performed according to the departmental dose-optimization program which includes automated exposure control, adjustment of the mA and/or kV according to patient size and/or use of iterative reconstruction technique. COMPARISON:  None Available. FINDINGS: Lower  chest: Chronic interstitial changes noted in both lung bases with trace dependent atelectasis. Small hiatal hernia noted. Hepatobiliary: No suspicious focal abnormality in the liver on this study without intravenous contrast. Subtle nodularity of liver contour raises the question of cirrhosis. Cholecystectomy. Common bile duct measures 9 mm, similar to prior. Pancreas: No focal mass lesion. No dilatation of the main duct. No intraparenchymal cyst. No peripancreatic edema. Spleen: No splenomegaly. No suspicious focal mass lesion. Adrenals/Urinary Tract: No adrenal nodule or mass. 2.5 cm lower pole cyst right kidney mildly progressive in the interval. No followup imaging is recommended. Left kidney unremarkable. No evidence for hydroureter. Mild right-sided bladder wall thickening and irregularity. Stomach/Bowel: Small hiatal hernia. Stomach decompressed. Duodenum is normally positioned as is the ligament of Treitz. Duodenal diverticulum noted. No small bowel wall thickening. No small bowel dilatation. The terminal ileum is normal. The  appendix is not well visualized, but there is no edema or inflammation in the region of the cecum. No gross colonic mass. No colonic wall thickening. Diverticuli are seen scattered along the entire length of the colon without CT findings of diverticulitis. Vascular/Lymphatic: There is mild atherosclerotic calcification of the abdominal aorta without aneurysm. There is no gastrohepatic or hepatoduodenal ligament lymphadenopathy. No retroperitoneal or mesenteric lymphadenopathy. No pelvic sidewall lymphadenopathy. Reproductive: Unremarkable. Other: No intraperitoneal free fluid. Intraperitoneal catheter likely from VP shunt. Musculoskeletal: Small supraumbilical midline ventral hernia contains only fat. Gas bubble in the subcutaneous fat of the right lower anterior abdominal wall is probably from an injection. No worrisome lytic or sclerotic osseous abnormality. IMPRESSION: 1. No acute  findings in the abdomen or pelvis. Specifically, no findings to explain the patient's history of abdominal pain. 2. Mild right-sided bladder wall thickening and irregularity. Correlation with urinalysis recommended to exclude cystitis. Urology follow-up likely warranted to exclude neoplasm. 3. Subtle nodularity of liver contour raises the question of cirrhosis. 4. Small hiatal hernia. 5. Small supraumbilical midline ventral hernia contains only fat. 6.  Aortic Atherosclerosis (ICD10-I70.0). Electronically Signed   By: Kennith Center M.D.   On: 02/03/2023 15:59   EEG adult  Result Date: 02/03/2023 Charlsie Quest, MD     02/03/2023  8:53 AM Patient Name: Erin Shaw MRN: [id number] Epilepsy Attending: Charlsie Quest Referring Physician/Provider: Cathren Harsh, MD Date: 02/03/2023 Duration: 23.22 mins Patient history: 77yo F with ams getting eeg to evaluate for seizure. Level of alertness: Awake, asleep AEDs during EEG study: GBP, Ativan Technical aspects: This EEG study was done with scalp electrodes positioned according to the 10-20 International system of electrode placement. Electrical activity was reviewed with band pass filter of 1-70Hz , sensitivity of 7 uV/mm, display speed of 51mm/sec with a 60Hz  notched filter applied as appropriate. EEG data were recorded continuously and digitally stored.  Video monitoring was available and reviewed as appropriate. Description: The posterior dominant rhythm consists of 7 Hz activity of moderate voltage (25-35 uV) seen predominantly in posterior head regions, symmetric and reactive to eye opening and eye closing. Sleep was characterized by vertex waves, sleep spindles (12 to 14 Hz), maximal frontocentral region. EEG showed continuous generalized predominantly 5 to 6 Hz theta slowing admixed with intermittent generalized 2-3Hz  delta slowing. Hyperventilation and photic stimulation were not performed.   ABNORMALITY - Continuous slow, generalized - Background slow  IMPRESSION: This study is suggestive of moderate diffuse encephalopathy, nonspecific etiology. No seizures or epileptiform discharges were seen throughout the recording. Charlsie Quest   MR BRAIN WO CONTRAST  Result Date: 02/02/2023 CLINICAL DATA:  Altered mental status, NPH with shunt, unwitnessed fall EXAM: MRI HEAD WITHOUT CONTRAST TECHNIQUE: Multiplanar, multiecho pulse sequences of the brain and surrounding structures were obtained without intravenous contrast. COMPARISON:  03/29/2014 MRI head, correlation is made with CT head 02/02/2023 and 10/27/2022 FINDINGS: Evaluation is limited by motion, as well as susceptibility from the patient's shunt hardware. Brain: Right parietal approach ventriculostomy catheter, which is better seen on the same-day CT, terminating in the third ventricle. Unchanged size and configuration of the ventricles compared to the most recent MRI and the 10/27/2022 CT. No acute infarct, hemorrhage, mass, mass effect, or midline shift. No extra-axial collection. Again noted is periventricular T2 hyperintense signal, which may be related to transependymal CSF resorption and/or chronic small vessel ischemic disease. 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 Vascular: Normal arterial flow voids. Skull and upper cervical spine: Normal marrow signal. Hyperostosis frontalis. Sinuses/Orbits: No acute finding. Status post bilateral lens replacements. Other: Fluid in left mastoid air cells. IMPRESSION: 1. Evaluation is limited by motion and susceptibility from the patient's shunt hardware. Within this limitation, no acute intracranial process. 2. Unchanged size and configuration of the ventricles compared to the 2015 MRI and 10/27/2022 CT. 3. Unchanged periventricular T2 hyperintense signal, which may be related to transependymal CSF resorption and/or chronic small vessel ischemic disease. 4. Remote lacunar  infarct in the right lateral thalamus, which is new from the prior MRI but was present on the 10/27/2022 CT. Electronically Signed   By: Wiliam Ke M.D.   On: 02/02/2023 19:40   DG HIP UNILAT WITH PELVIS 2-3 VIEWS RIGHT  Result Date: 02/02/2023 CLINICAL DATA:  Fall, dementia EXAM: DG HIP (WITH OR WITHOUT PELVIS) 2-3V RIGHT COMPARISON:  Abdominal radiograph 02/02/2023 FINDINGS: There is no evidence of hip fracture or dislocation. There is no evidence of arthropathy or other focal bone abnormality. IMPRESSION: Negative. Electronically Signed   By: Gaylyn Rong M.D.   On: 02/02/2023 15:27   DG HIP UNILAT WITH PELVIS 2-3 VIEWS LEFT  Result Date: 02/02/2023 CLINICAL DATA:  Fall at nursing. Nonverbal patient with disorientation. History of dementia. EXAM: DG HIP (WITH OR WITHOUT PELVIS) 2-3V LEFT COMPARISON:  Abdomen radiograph 02/02/2023 FINDINGS: VP shunt tubing terminates in the pelvis. No fracture or acute bony findings noted. IMPRESSION: 1. No acute bony findings. 2. VP shunt tubing terminates in the pelvis. Electronically Signed   By: Gaylyn Rong M.D.   On: 02/02/2023 15:27   CT Cervical Spine Wo Contrast  Result Date: 02/02/2023 CLINICAL DATA:  Neck trauma (Age >= 65y) EXAM: CT CERVICAL SPINE WITHOUT CONTRAST TECHNIQUE: Multidetector CT imaging of the cervical spine was performed without intravenous contrast. Multiplanar CT image reconstructions were also generated. RADIATION DOSE REDUCTION: This exam was performed according to the departmental dose-optimization program which includes automated exposure control, adjustment of the mA and/or kV according to patient size and/or use of iterative reconstruction technique. COMPARISON:  CT C Spine 06/16/22 FINDINGS: Limitations: Motion degraded exam. Degree of motion artifact limits assessment of the mid and lower cervical spine. Alignment: Normal. Skull base and vertebrae: Within the limitations of a significantly motion degraded exam, definite  evidence of a cervical spine fracture. Soft tissues and spinal canal: No prevertebral fluid or swelling. No visible canal hematoma. Disc levels:  No evidence of high-grade spinal canal stenosis Upper chest: Negative. Other: None IMPRESSION: Within the limitations of a significantly motion degraded exam, no definite evidence of a cervical spine fracture. Electronically Signed   By: Lorenza Cambridge M.D.   On: 02/02/2023 15:15   US Abdomen Limited RUQ (LIVER/GB)  Result Date: 02/02/2023 CLINICAL DATA:  Elevated LFTs EXAM: ULTRASOUND ABDOMEN LIMITED RIGHT UPPER QUADRANT COMPARISON:  02/02/2023 FINDINGS: Gallbladder: Prior cholecystectomy. Common bile duct: Diameter: 8 mm Liver: Heterogeneous echotexture with a slightly nodular contour as can be seen with cirrhosis. Portal vein is patent on color Doppler imaging with normal direction of blood flow towards the liver. Other: None. IMPRESSION: 1. Heterogeneous echotexture with a slightly nodular contour as can be seen with cirrhosis. 2. Prior cholecystectomy. Electronically Signed   By: Elige Ko M.D.   On: 02/02/2023 14:40   CT Head Wo Contrast  Result Date: 02/02/2023 CLINICAL DATA:  Fall, dementia EXAM: CT HEAD WITHOUT CONTRAST TECHNIQUE: Contiguous axial images were obtained from the base of  the skull through the vertex without intravenous contrast. RADIATION DOSE REDUCTION: This exam was performed according to the departmental dose-optimization program which includes automated exposure control, adjustment of the mA and/or kV according to patient size and/or use of iterative reconstruction technique. COMPARISON:  None Available. FINDINGS: Evaluation is limited by motion. Brain: Right parietal approach ventriculostomy catheter with tip terminating third ventricle. Ventricular prominence appears commensurate with sulcal size. No evidence of acute infarction, hemorrhage, mass, mass effect, or midline shift. No extra-axial fluid collection. Periventricular white  matter changes, likely the sequela of chronic small vessel ischemic disease. Vascular: No hyperdense vessel. Atherosclerotic calcifications in the intracranial carotid and vertebral arteries. Skull: Negative for fracture or focal lesion. Hyperostosis frontalis. Sinuses/Orbits: No acute finding. Other: The mastoid air cells are well aerated. Evaluation of the right scalp and neck soft tissue shunt tubing is limited by motion. IMPRESSION: 1. No acute intracranial process. 2. Right parietal approach ventriculostomy catheter with tip terminating third ventricle. Ventricular prominence appears commensurate with sulcal size. Prior images would be helpful to determine if hydrocephalus is present. Electronically Signed   By: Wiliam Ke M.D.   On: 02/02/2023 12:36   DG Cervical Spine 1 View  Result Date: 02/02/2023 CLINICAL DATA:  Shunt malfunction EXAM: DG CERVICAL SPINE - 1 VIEW; ABDOMEN - 1 VIEW; CHEST 1 VIEW; SKULL - 2 VIEW COMPARISON:  None Available. 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 There is gap posterior along the skull consistent with a area of reservoir otherwise contiguous shunt tubing seen along the right side of the neck extending posterior to anterior, superior to inferior. Tubing extends vertically along the midthorax and along the epigastric region of the abdomen. The catheter extends into the right upper quadrant and then courses back to the left and inferior with the tip along the mid left lateral hemipelvis. Again no areas of discontinuity along the course of the shunt tubing. Elsewhere, along the two views of the skull no displaced or depressed skull fracture. Grossly clear paranasal sinuses. Patient is near edentulous with some metallic hardware along the mandible. Degenerative changes seen along the spine. There is enlarged cardiopericardial silhouette with some vascular congestion and interstitial changes. No pneumothorax,  effusion, consolidation. Calcified aorta. Slightly widened upper mediastinum. Gas is seen in nondilated loops of small and large bowel. No obstruction. Mild stool. Surgical clips in the right upper quadrant. No obvious free air on this supine radiograph. 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 Enlarged heart with vascular congestion and interstitial changes. Acute versus chronic. Please correlate with any prior follow up. There also some widening of the mediastinum of uncertain etiology. Please correlate again with any prior or additional workup when appropriate. Nonspecific bowel gas pattern. Electronically Signed   By: Karen Kays M.D.   On: 02/02/2023 12:14   DG Skull 1-3 Views  Result Date: 02/02/2023 CLINICAL DATA:  Shunt malfunction EXAM: DG CERVICAL SPINE - 1 VIEW; ABDOMEN - 1 VIEW; CHEST 1 VIEW; SKULL - 2 VIEW COMPARISON:  None Available. 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 There is gap posterior along the skull consistent with a area of reservoir otherwise contiguous shunt tubing seen along the right side of the neck extending posterior to anterior, superior to  inferior. Tubing extends vertically along the midthorax and along the epigastric region of the abdomen. The catheter extends into the right upper quadrant and then courses back to the left and inferior with the tip along the mid left lateral hemipelvis. Again no areas of discontinuity along the course of the shunt tubing. Elsewhere, along the two views of the skull no displaced or depressed skull fracture. Grossly clear paranasal sinuses. Patient is near edentulous with some metallic hardware along the mandible. Degenerative changes seen along the spine. There is enlarged cardiopericardial silhouette with  some vascular congestion and interstitial changes. No pneumothorax, effusion, consolidation. Calcified aorta. Slightly widened upper mediastinum. Gas is seen in nondilated loops of small and large bowel. No obstruction. Mild stool. Surgical clips in the right upper quadrant. No obvious free air on this supine radiograph. 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 Enlarged heart with vascular congestion and interstitial changes. Acute versus chronic. Please correlate with any prior follow up. There also some widening of the mediastinum of uncertain etiology. Please correlate again with any prior or additional workup when appropriate. Nonspecific bowel gas pattern. Electronically Signed   By: Karen Kays M.D.   On: 02/02/2023 12:14   DG Chest 1 View  Result Date: 02/02/2023 CLINICAL DATA:  Shunt malfunction EXAM: DG CERVICAL SPINE - 1 VIEW; ABDOMEN - 1 VIEW; CHEST 1 VIEW; SKULL - 2 VIEW COMPARISON:  None Available. 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 There is gap posterior along the skull consistent with a area of reservoir otherwise contiguous shunt tubing seen along the right side of the neck extending posterior to anterior, superior to inferior. Tubing extends vertically along the midthorax and along the epigastric region of the abdomen. The catheter extends into the right upper quadrant and then courses back to the left and inferior with the tip along the mid left lateral hemipelvis. Again no areas of discontinuity along the course of the shunt tubing. Elsewhere, along the two views of the skull no displaced or depressed skull fracture. Grossly clear paranasal sinuses. Patient is near edentulous with some metallic hardware along the mandible. Degenerative changes seen  along the spine. There is enlarged cardiopericardial silhouette with some vascular congestion and interstitial changes. No pneumothorax, effusion, consolidation. Calcified aorta. Slightly widened upper mediastinum. Gas is seen in nondilated loops of small and large bowel. No obstruction. Mild stool. Surgical clips in the right upper quadrant. No obvious free air on this supine radiograph. 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 Enlarged heart with vascular congestion and interstitial changes. Acute versus chronic. Please correlate with any prior follow up. There also some widening of the mediastinum of uncertain etiology. Please correlate again with any prior or additional workup when appropriate. Nonspecific bowel gas pattern. Electronically Signed   By: Karen Kays M.D.   On: 02/02/2023 12:14   DG Abd 1 View  Result Date: 02/02/2023 CLINICAL DATA:  Shunt malfunction EXAM: DG CERVICAL SPINE - 1 VIEW; ABDOMEN - 1 VIEW; CHEST 1 VIEW; SKULL - 2 VIEW COMPARISON:  None Available. 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 There is gap posterior along the skull consistent with a area of reservoir otherwise contiguous shunt tubing seen along the right side of the neck extending posterior to anterior, superior to inferior. Tubing extends vertically along the midthorax and along the epigastric region of the abdomen. The catheter extends into the right upper quadrant and then courses back to the left and inferior with the tip along the mid left lateral hemipelvis. Again no areas of discontinuity along the course of the shunt tubing. Elsewhere, along the two views of the skull no displaced or depressed skull fracture. Grossly clear paranasal sinuses. Patient is near edentulous with some  metallic hardware along the mandible. Degenerative changes seen along the spine. There is enlarged cardiopericardial silhouette with some vascular congestion and interstitial changes. No pneumothorax, effusion, consolidation. Calcified aorta. Slightly widened upper mediastinum. Gas is seen in nondilated loops of small and large bowel. No obstruction. Mild stool. Surgical clips in the right upper quadrant. No obvious free air on this supine radiograph. 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 Enlarged heart with vascular congestion and interstitial changes. Acute versus chronic. Please correlate with any prior follow up. There also some widening of the mediastinum of uncertain etiology. Please correlate again with any prior or additional workup when appropriate. Nonspecific bowel gas pattern. Electronically Signed   By: Karen Kays M.D.   On: 02/02/2023 12:14    Microbiology: Results for orders placed or performed during the hospital encounter of 02/02/23  Culture, blood (Routine X 2) w Reflex to ID Panel     Status: Abnormal   Collection Time: 02/02/23  4:48 PM   Specimen: BLOOD  Result Value Ref Range Status   Specimen Description BLOOD LEFT ANTECUBITAL  Final   Special Requests   Final    BOTTLES DRAWN AEROBIC AND ANAEROBIC Blood Culture adequate volume   Culture  Setup Time   Final    GRAM NEGATIVE RODS AEROBIC BOTTLE ONLY CRITICAL VALUE NOTED.  VALUE IS CONSISTENT WITH PREVIOUSLY REPORTED AND CALLED VALUE.    Culture (A)  Final    KLEBSIELLA PNEUMONIAE SUSCEPTIBILITIES PERFORMED ON PREVIOUS CULTURE WITHIN THE LAST 5 DAYS. Performed at [facility], [address] [place], [place] [address]    Report Status 02/05/2023 FINAL  Final  Culture, blood (Routine X 2) w Reflex to ID Panel     Status: Abnormal    Collection Time: 02/02/23  4:49 PM   Specimen: BLOOD LEFT HAND  Result Value Ref Range Status   Specimen Description BLOOD LEFT HAND  Final   Special Requests   Final    BOTTLES DRAWN AEROBIC AND ANAEROBIC Blood Culture adequate volume   Culture  Setup Time   Final    GRAM NEGATIVE RODS IN BOTH AEROBIC AND ANAEROBIC BOTTLES CRITICAL RESULT CALLED TO, READ BACK BY AND VERIFIED WITH: [facility] MEDELYN Judie Petit [phone] @0937  BY SM Performed at [facility], [address], [place], [place] [address]    Culture KLEBSIELLA PNEUMONIAE (A)  Final   Report Status 02/05/2023 FINAL  Final   Organism ID, Bacteria KLEBSIELLA PNEUMONIAE  Final      Susceptibility   Klebsiella pneumoniae - MIC*    AMPICILLIN RESISTANT Resistant     CEFEPIME <=0.12 SENSITIVE Sensitive     CEFTAZIDIME <=1 SENSITIVE Sensitive  CEFTRIAXONE <=0.25 SENSITIVE Sensitive     CIPROFLOXACIN <=0.25 SENSITIVE Sensitive     GENTAMICIN <=1 SENSITIVE Sensitive     IMIPENEM <=0.25 SENSITIVE Sensitive     TRIMETH/SULFA <=20 SENSITIVE Sensitive     AMPICILLIN/SULBACTAM 4 SENSITIVE Sensitive     PIP/TAZO <=4 SENSITIVE Sensitive     * KLEBSIELLA PNEUMONIAE  Blood Culture ID Panel (Reflexed)     Status: Abnormal   Collection Time: 02/02/23  4:49 PM  Result Value Ref Range Status   Enterococcus faecalis NOT DETECTED NOT DETECTED Final   Enterococcus Faecium NOT DETECTED NOT DETECTED Final   Listeria monocytogenes NOT DETECTED NOT DETECTED Final   Staphylococcus species NOT DETECTED NOT DETECTED Final   Staphylococcus aureus (BCID) NOT DETECTED NOT DETECTED Final   Staphylococcus epidermidis NOT DETECTED NOT DETECTED Final   Staphylococcus lugdunensis NOT DETECTED NOT DETECTED Final   Streptococcus species NOT DETECTED NOT DETECTED Final   Streptococcus agalactiae NOT DETECTED NOT DETECTED Final   Streptococcus pneumoniae NOT DETECTED NOT DETECTED Final   Streptococcus pyogenes NOT DETECTED NOT DETECTED Final    A.calcoaceticus-baumannii NOT DETECTED NOT DETECTED Final   Bacteroides fragilis NOT DETECTED NOT DETECTED Final   Enterobacterales DETECTED (A) NOT DETECTED Final    Comment: Enterobacterales represent a large order of gram negative bacteria, not a single organism. CRITICAL RESULT CALLED TO, READ BACK BY AND VERIFIED WITH: [facility] MEDELYN Judie Petit [phone] @0937  BY SM    Enterobacter cloacae complex NOT DETECTED NOT DETECTED Final   Escherichia coli NOT DETECTED NOT DETECTED Final   Klebsiella aerogenes NOT DETECTED NOT DETECTED Final   Klebsiella oxytoca NOT DETECTED NOT DETECTED Final   Klebsiella pneumoniae DETECTED (A) NOT DETECTED Final    Comment: CRITICAL RESULT CALLED TO, READ BACK BY AND VERIFIED WITH: [facility] MEDELYN M [phone] @0937  BY SM    Proteus species NOT DETECTED NOT DETECTED Final   Salmonella species NOT DETECTED NOT DETECTED Final   Serratia marcescens NOT DETECTED NOT DETECTED Final   Haemophilus influenzae NOT DETECTED NOT DETECTED Final   Neisseria meningitidis NOT DETECTED NOT DETECTED Final   Pseudomonas aeruginosa NOT DETECTED NOT DETECTED Final   Stenotrophomonas maltophilia NOT DETECTED NOT DETECTED Final   Candida albicans NOT DETECTED NOT DETECTED Final   Candida auris NOT DETECTED NOT DETECTED Final   Candida glabrata NOT DETECTED NOT DETECTED Final   Candida krusei NOT DETECTED NOT DETECTED Final   Candida parapsilosis NOT DETECTED NOT DETECTED Final   Candida tropicalis NOT DETECTED NOT DETECTED Final   Cryptococcus neoformans/gattii NOT DETECTED NOT DETECTED Final   CTX-M ESBL NOT DETECTED NOT DETECTED Final   Carbapenem resistance IMP NOT DETECTED NOT DETECTED Final   Carbapenem resistance KPC NOT DETECTED NOT DETECTED Final   Carbapenem resistance NDM NOT DETECTED NOT DETECTED Final   Carbapenem resist OXA 48 LIKE NOT DETECTED NOT DETECTED Final   Carbapenem resistance VIM NOT DETECTED NOT DETECTED Final    Comment: Performed at [facility],  [address] [place], [place] [address]  MRSA Next Gen by PCR, Nasal     Status: None   Collection Time: 02/02/23  7:25 PM   Specimen: Nasal Mucosa; Nasal Swab  Result Value Ref Range Status   MRSA by PCR Next Gen NOT DETECTED NOT DETECTED Final    Comment: (NOTE) The GeneXpert MRSA Assay (FDA approved for NASAL specimens only), is one component of a comprehensive MRSA colonization surveillance program. It is not intended to diagnose MRSA infection nor to  guide or monitor treatment for MRSA infections. Test performance is not FDA approved in patients less than 82 years old. Performed at [facility], [address] [place], [place] [address]   Urine Culture     Status: Abnormal   Collection Time: 02/03/23  8:55 AM   Specimen: Urine, Random  Result Value Ref Range Status   Specimen Description URINE, RANDOM  Final   Special Requests   Final    NONE Reflexed from [id number] Performed at [facility], [address] [place], [place] [address]    Culture MULTIPLE SPECIES PRESENT, SUGGEST RECOLLECTION (A)  Final   Report Status 02/04/2023 FINAL  Final  Culture, blood (Routine X 2) w Reflex to ID Panel     Status: None (Preliminary result)   Collection Time: 02/04/23  7:07 AM   Specimen: BLOOD  Result Value Ref Range Status   Specimen Description BLOOD BLOOD LEFT HAND  Final   Special Requests   Final    BOTTLES DRAWN AEROBIC AND ANAEROBIC Blood Culture adequate volume   Culture   Final    NO GROWTH < 24 HOURS Performed at [facility], [address] [place], [place] [address]    Report Status PENDING  Incomplete  Culture, blood (Routine X 2) w Reflex to ID Panel     Status: None (Preliminary result)   Collection Time: 02/04/23  7:12 AM   Specimen: BLOOD  Result Value Ref Range Status   Specimen Description BLOOD BLOOD LEFT HAND  Final   Special Requests   Final    BOTTLES DRAWN AEROBIC AND ANAEROBIC Blood Culture adequate volume   Culture   Final    NO GROWTH  < 24 HOURS Performed at [facility], [address] [place], [place] [address]    Report Status PENDING  Incomplete    Labs: CBC: Recent Labs  Lab 02/02/23 1037 02/02/23 1648 02/03/23 0225 02/04/23 0541 02/05/23 0347  WBC 8.1 14.6* 9.4 4.4 4.5  NEUTROABS 7.6  --   --   --   --   HGB 15.7* 15.0 14.0 12.8 13.2  HCT 44.2 42.8 40.1 37.2 37.9  MCV 90.4 91.3 89.5 89.6 88.3  PLT 140* 167 141* 124* 149*   Basic Metabolic Panel: Recent Labs  Lab 02/02/23 1037 02/02/23 1648 02/03/23 0225 02/04/23 0541 02/05/23 0347  NA 137  --  137 136 137  K 2.9*  --  4.0 3.3* 3.5  CL 101  --  101 101 101  CO2 24  --  26 28 25   GLUCOSE 230*  --  126* 126* 135*  BUN 9  --  12 12 11   CREATININE 0.94 0.84 0.84 0.76 0.58  CALCIUM 9.1  --  9.0 8.8* 9.0   Liver Function Tests: Recent Labs  Lab 02/02/23 1037 02/03/23 0225  AST 195* 118*  ALT 137* 107*  ALKPHOS 110 93  BILITOT 6.5* 5.1*  PROT 7.0 6.6  ALBUMIN 3.3* 3.1*   CBG: Recent Labs  Lab 02/04/23 1223 02/04/23 1557 02/04/23 2108 02/05/23 0610 02/05/23 1224  GLUCAP 164* 120* 117* 114* 128*    Discharge time spent: greater than 30 minutes.  Signed: Thad Ranger, MD [facility] 02/05/2023

## 2023-02-06 LAB — CULTURE, BLOOD (ROUTINE X 2)
Special Requests: ADEQUATE
Special Requests: ADEQUATE

## 2023-02-07 ENCOUNTER — Encounter (HOSPITAL_COMMUNITY): Payer: Self-pay

## 2023-02-07 LAB — CULTURE, BLOOD (ROUTINE X 2): Culture: NO GROWTH

## 2023-02-07 LAB — VITAMIN B1: Vitamin B1 (Thiamine): 121.5 nmol/L (ref 66.5–200.0)

## 2023-02-08 LAB — CULTURE, BLOOD (ROUTINE X 2): Culture: NO GROWTH

## 2023-02-09 LAB — CULTURE, BLOOD (ROUTINE X 2)

## 2023-11-04 ENCOUNTER — Emergency Department (HOSPITAL_COMMUNITY): Payer: Medicare (Managed Care)

## 2023-11-04 ENCOUNTER — Emergency Department (HOSPITAL_COMMUNITY)
Admission: EM | Admit: 2023-11-04 | Discharge: 2023-11-05 | Disposition: A | Discharge status: Home / Self Care | Payer: Medicare (Managed Care) | Attending: Emergency Medicine | Admitting: Emergency Medicine

## 2023-11-04 DIAGNOSIS — I517 Cardiomegaly: Secondary | ICD-10-CM | POA: Insufficient documentation

## 2023-11-04 DIAGNOSIS — M47812 Spondylosis without myelopathy or radiculopathy, cervical region: Secondary | ICD-10-CM | POA: Insufficient documentation

## 2023-11-04 DIAGNOSIS — Z66 Do not resuscitate: Secondary | ICD-10-CM | POA: Insufficient documentation

## 2023-11-04 DIAGNOSIS — W06XXXA Fall from bed, initial encounter: Secondary | ICD-10-CM | POA: Insufficient documentation

## 2023-11-04 DIAGNOSIS — S0990XA Unspecified injury of head, initial encounter: Secondary | ICD-10-CM | POA: Diagnosis not present

## 2023-11-04 DIAGNOSIS — W19XXXA Unspecified fall, initial encounter: Secondary | ICD-10-CM

## 2023-11-04 DIAGNOSIS — I6782 Cerebral ischemia: Secondary | ICD-10-CM | POA: Diagnosis not present

## 2023-11-04 DIAGNOSIS — Z043 Encounter for examination and observation following other accident: Secondary | ICD-10-CM | POA: Insufficient documentation

## 2023-11-04 NOTE — ED Provider Notes (Signed)
  [facility]EMER[facility]ENCY DEPT [facility] Emergency Department Provider Note MRN:  [id number]  Arrival date & time: 11/04/23     Chief Complaint   No chief complaint on file.   History of Present Illness   [name] is a 78 y.o. year-old female presents to the ED with chief complaint of fall out of bed.  BIB EMS from [facility] home.  DNR with MOST form. Not anticoagulated.  Questionable head strike.  No reported LOC.  Patient denies any pain.  History provided by patient.   Review of Systems  Pertinent positive and negative review of systems noted in HPI.    Physical Exam   Vitals:   11/04/23 2148  BP: 128/65  Pulse: 78  Resp: 20  Temp: 98.7 F (37.1 C)  SpO2: 100%    CONSTITUTIONAL:  non toxic-appearing, NAD NEURO:  Alert and oriented x 3, CN 3-12 grossly intact EYES:  eyes equal and reactive ENT/NECK:  Supple, no stridor  CARDIO:  normal rate, regular rhythm, appears well-perfused  PULM:  No respiratory distress, CTAB GI/GU:  non-distended,  MSK/SPINE:  No gross deformities, no edema, moves all extremities  SKIN:  no rash, atraumatic   *Additional and/or pertinent findings included in MDM below  Diagnostic and Interventional Summary    EKG Interpretation Date/Time:    Ventricular Rate:    PR Interval:    QRS Duration:    QT Interval:    QTC Calculation:   R Axis:      Text Interpretation:         Labs Reviewed - No data to display  CT HEAD WO CONTRAST ( )  Final Result    CT Cervical Spine Wo Contrast  Final Result    DG Pelvis Portable  Final Result    DG Chest Port 1 View  Final Result      Medications - No data to display   Procedures  /  Critical Care Procedures  ED Course and Medical Decision Making  I have reviewed the triage vital signs, the nursing notes, and pertinent available records from the EMR.  Social Determinants Affecting Complexity of Care: Patient has no clinically significant social determinants  affecting this chief complaint..   ED Course:    Medical Decision Making Patient here with unwitnessed fall from bed.  She denies having any pain.  No traumatic injuries noted.  Moving all extremities without difficulty.  CTs and x-rays are reassuring.  VSS.  Feel that patient can be safely discharged home.  Amount and/or Complexity of Data Reviewed Radiology: ordered.         Consultants: No consultations were needed in caring for this patient.   Treatment and Plan: I considered admission due to patient's initial presentation, but after considering the examination and diagnostic results, patient will not require admission and can be discharged with outpatient follow-up.    Final Clinical Impressions(s) / ED Diagnoses     ICD-10-CM   1. Fall, initial encounter  W[facility]       ED Discharge Orders     None         Discharge Instructions Discussed with and Provided to Patient:     Discharge Instructions      Your CT scans and x-rays looked good.  Please follow-up with your doctor.       Roxy Horseman, PA-C 11/04/23 2322    Lonell Grandchild, MD 11/07/23 [phone]

## 2023-11-04 NOTE — Discharge Instructions (Signed)
 Your CT scans and x-rays looked good.  Please follow-up with your doctor.

## 2023-11-04 NOTE — ED Triage Notes (Signed)
 PT BIB [facility] from [facility] after pt had a unwitnessed fall out of bed. PT reports head strike but no LOC. EMS reports that the nurse told from facility told him that pt has stent in her head No bleeding pt says she is not on blood thinners. BG 169, GCS 15.

## 2023-11-04 NOTE — ED Notes (Signed)
 Called [facility] no answer

## 2023-11-05 NOTE — ED Notes (Signed)
 Called [facility] for report no answer

## 2024-03-20 NOTE — Progress Notes (Signed)
 Otolaryngology Clinic Note  HPI:    [name] is a 78 y.o. female who presents as a new patient.  This [name] presents today for evaluation of ear discomfort.  She is a bilateral hearing aid wearer.  She resides in a local assisted living facility.  She is not experiencing any drainage or bleeding from the ears but notes discomfort with the hearing aids.  Additionally, she does not think one of her hearing aids is charging properly.  PMH/Meds/All/SocHx/FamHx/ROS:   Medical History[1]  Surgical History[2]  No family history of bleeding disorders, wound healing problems or difficulty with anesthesia.      Current Medications[3]  A complete ROS was performed with pertinent positives/negatives noted in the HPI. The remainder of the ROS are negative.    Physical Exam:    BP 142/79   Pulse 98   Ht 1.651 m (5' 5)   Wt 80.3 kg (177 lb)   BMI 29.45 kg/m   Constitutional:  Patient appears well-nourished and well-developed. No acute distress.   Head/Face: Facial features are symmetric. Skull is normocephalic. Hair and scalp are normal. Normal temporal artery pulses. TMJ shows no joint deformity swelling or erythema.   Eyes: Pupils are equal, round and reactive to light. Conjunctiva and lids are normal. Normal extraocular mobility. Normal vision by patient report.   Ears:     Right: Pinna and external meatus normal, normal ear canal skin and caliber without excessive cerumen or drainage. Tympanic membranes intact without effusion or infection.    Left: Pinna and external meatus normal, normal ear canal skin and caliber without excessive cerumen or drainage. Tympanic membranes intact without effusion or infection.    Nose/Sinus/Nasopharynx: Septum is normal. Normal nasal mucosa. Normal inferior turbinates.    Oral cavity/Oropharynx: Lips normal, teeth and gums normal with good dentition, normal oral vestibule. Normal floor of mouth, tongue and oral mucosa, no  mucosal lesions, ulcer or mass, normal tongue mobility.  Hard and soft palate normal with normal mobility. One plus tonsils, no erythema or exudate. Base of tongue, retromolar trigone and oral pharynx normal. Normal sensation, mobility and gag.   Neck: No cervical lymphadenopathy, mass or swelling. Salivary glands normal to palpation without swelling, erythema or mass. Normal facial nerve function. Normal thyroid  gland palpation.   Neurological: Alert and oriented to self, place and time.  Normal reflexes and motor skills, balance and coordination.   Psychiatric: No unusual anxiety or evidence of depression. Appropriate affect.     Independent Review of Additional Tests or Records:  None  Procedures:  None   Impression & Plans:   1) ear discomfort associated with hearing aid wearing   I was happy to be able to reassure [name] that I see no worrisome findings on today's examination.  No pressure sores or ulcerations or irritation or inflammation from the hearing aids.  I have suggested she return to her dispensing audiologist for them to reevaluate the molds as well as check into the situation with recharging the aids. We will be happy to see back here as needed    Kiara RAMAN. [place]hour, PA-C [facility]        [1] History reviewed. No pertinent past medical history. [2] History reviewed. No pertinent surgical history. [3]  Current Outpatient Medications:    albuterol HFA (PROVENTIL HFA;VENTOLIN HFA;PROAIR HFA) 90 mcg/actuation inhaler, , Disp: , Rfl:    ARIPiprazole  (ABILIFY ) 5 mg tablet, , Disp: , Rfl:    buPROPion (WELLBUTRIN) 75 mg tablet, , Disp: ,  Rfl:    desvenlafaxine (PRISTIQ) 50 mg 24 hr tablet, , Disp: , Rfl:    gabapentin  (NEURONTIN ) 100 mg capsule, , Disp: , Rfl:    LORazepam  (ATIVAN ) 1 mg tablet, , Disp: , Rfl:    mirabegron  (MYRBETRIQ ) 25 mg Tb24, , Disp: , Rfl:    nitrofurantoin , macrocrystal-monohydrate, (MACROBID ) 100 mg capsule,  , Disp: , Rfl:    Nyamyc 100,000 unit/gram powder, , Disp: , Rfl:    pantoprazole  (PROTONIX ) 20 mg EC tablet, , Disp: , Rfl:    prazosin  (MINIPRESS ) 2 mg capsule, , Disp: , Rfl:    Spiriva  Respimat 2.5 mcg/actuation mist inhaler, , Disp: , Rfl:    zolpidem  (AMBIEN ) 5 mg tablet, , Disp: , Rfl:

## 2024-08-11 ENCOUNTER — Other Ambulatory Visit: Payer: Self-pay

## 2024-08-11 ENCOUNTER — Emergency Department (HOSPITAL_COMMUNITY)
Admission: EM | Admit: 2024-08-11 | Discharge: 2024-08-11 | Disposition: A | Discharge status: Home / Self Care | Source: Skilled Nursing Facility | Attending: Emergency Medicine | Admitting: Emergency Medicine

## 2024-08-11 ENCOUNTER — Emergency Department (HOSPITAL_COMMUNITY)

## 2024-08-11 ENCOUNTER — Encounter (HOSPITAL_COMMUNITY): Payer: Self-pay

## 2024-08-11 DIAGNOSIS — W06XXXA Fall from bed, initial encounter: Secondary | ICD-10-CM | POA: Diagnosis not present

## 2024-08-11 DIAGNOSIS — R519 Headache, unspecified: Secondary | ICD-10-CM | POA: Insufficient documentation

## 2024-08-11 DIAGNOSIS — M25562 Pain in left knee: Secondary | ICD-10-CM | POA: Diagnosis present

## 2024-08-11 DIAGNOSIS — W19XXXA Unspecified fall, initial encounter: Secondary | ICD-10-CM

## 2024-08-11 NOTE — ED Notes (Signed)
 [facility] at bedside for transport.

## 2024-08-11 NOTE — ED Triage Notes (Signed)
 79 yo female from [facility]. Pt stated she slid out of bed and fell on her L knee, notes swelling and pain at the touch. No blood thinners hx. Facility states that pt hit her head when she fell.   Vitals: 150/98 83 bpm 94% O2 RA CBG 212

## 2024-08-11 NOTE — ED Notes (Signed)
 Called [facility] to arrange transport to [facility].

## 2024-08-11 NOTE — Discharge Instructions (Addendum)
 Today you were seen after a fall.  Your workup in the emergency department is reassuring.  You may ice and take Tylenol  for your knee pain.  Thank you for letting us  treat you today. After reviewing your labs and imaging, I feel you are safe to go home. Please follow up with your PCP in the next several days and provide them with your records from this visit. Return to the Emergency Room if pain becomes severe or symptoms worsen.

## 2024-08-11 NOTE — ED Provider Notes (Signed)
 " [place] [facility] Provider Note   CSN: [id number] Arrival date & time: 08/11/24  1507     Patient presents with: Knee Injury and Fall   [name] is a 79 y.o. female.  Presents today from [employer].  Patient states she slid out of bed and hit her knee.  Patient still able to move her knee but reports swelling and initially had pain to palpation with EMS.  Patient denies head injury, however facility states that patient did hit her head when she fell.  Patient is not on any blood thinners.   HPI     Prior to Admission medications  Medication Sig Start Date End Date Taking? Authorizing Provider  acetaminophen  (TYLENOL ) 325 MG tablet Take 650 mg by mouth every 6 (six) hours as needed (for pain).    [provider]  acetaminophen  (TYLENOL ) 325 MG tablet Take 650 mg by mouth every 6 (six) hours as needed for mild pain, moderate pain or fever.    [provider]  amLODipine  (NORVASC ) 2.5 MG tablet Take 2.5 mg by mouth in the morning. (0900)    [provider]  ARIPiprazole  (ABILIFY ) 2 MG tablet Take 2 mg by mouth at bedtime. Dr. Vincente; need correct dosage    [provider]  ARIPiprazole  (ABILIFY ) 2 MG tablet Take 2 mg by mouth at bedtime.    [provider]  atorvastatin  (LIPITOR) 40 MG tablet Take 1 tablet (40 mg total) by mouth daily. Patient taking differently: Take 40 mg by mouth every evening. (2100) 01/10/20   Joshua Debby CROME, MD  bisacodyl  (DULCOLAX) 10 MG suppository Place 10 mg rectally daily as needed for moderate constipation.    [provider]  Camphor-Menthol-Methyl Sal (SALONPAS) 3.08-07-08 % PTCH Apply 1 Application topically daily. Apply to both knees topically.    [provider]  cyanocobalamin  (VITAMIN B12) 1000 MCG tablet Take 1 tablet (1,000 mcg total) by mouth daily. 02/05/23   Rai, Ripudeep MARLA, MD  desvenlafaxine (PRISTIQ) 100 MG 24 hr tablet Take 100 mg by mouth  every evening. (2100)    [provider]  desvenlafaxine (PRISTIQ) 50 MG 24 hr tablet Take 50 mg by mouth daily.    [provider]  diclofenac  Sodium (VOLTAREN  ARTHRITIS PAIN) 1 % GEL Apply 2-4 g topically in the morning, at noon, and at bedtime. Apply 2g to right shoulder and 4g to left knee.    [provider]  gabapentin  (NEURONTIN ) 100 MG capsule Take 100 mg by mouth 3 (three) times daily.    [provider]  gabapentin  (NEURONTIN ) 300 MG capsule Take 300 mg by mouth 3 (three) times daily. 06/19/21   [provider]  lidocaine  (LIDODERM ) 5 % Place 1 patch onto the skin daily. Remove & Discard patch within 12 hours or as directed by MD Patient taking differently: Place 1 patch onto the skin every 12 (twelve) hours as needed (pain). Remove & Discard patch within 12 hours or as directed by MD 04/16/19   Joshua Debby CROME, MD  lidocaine  (XYLOCAINE ) 5 % ointment Apply 1 Application topically daily. Take off after 12 hours.    [provider]  linaclotide  (LINZESS ) 145 MCG CAPS capsule Take 1 capsule (145 mcg total) by mouth daily before breakfast. 11/03/20   Joshua Debby CROME, MD  LORazepam  (ATIVAN ) 1 MG tablet Take 1 tablet (1 mg total) by mouth every 8 (eight) hours as needed for anxiety. 04/21/21   Joshua,  Debby CROME, MD  LORazepam  (ATIVAN ) 1 MG tablet Take 1 mg by mouth every 8 (eight) hours.    [provider]  LORazepam  (ATIVAN ) 1 MG tablet Take 1 tablet (1 mg total) by mouth in the morning, at noon, and at bedtime. 02/05/23   Rai, Ripudeep K, MD  miconazole (ANTIFUNGAL) 2 % powder Apply 1 application. topically 2 (two) times daily. Apply to breast and abdominal fold    [provider]  mirabegron  ER (MYRBETRIQ ) 25 MG TB24 tablet Take 1 tablet (25 mg total) by mouth daily. Patient taking differently: Take 25 mg by mouth at bedtime. 04/24/19   Joshua Debby CROME, MD  nystatin (MYCOSTATIN/NYSTOP) powder Apply 1 Application topically every 12  (twelve) hours as needed (Rash).    [provider]  ondansetron  (ZOFRAN ) 4 MG tablet Take 4 mg by mouth every 6 (six) hours as needed for nausea or vomiting.    [provider]  ondansetron  (ZOFRAN ) 4 MG tablet Take 1 tablet (4 mg total) by mouth every 6 (six) hours as needed for nausea. 02/05/23   Rai, Nydia POUR, MD  pantoprazole  (PROTONIX ) 40 MG tablet Take 1 tablet (40 mg total) by mouth daily. Patient taking differently: Take 40 mg by mouth in the morning. (0800) 10/26/17   Joshua Debby CROME, MD  pantoprazole  (PROTONIX ) 40 MG tablet Take 40 mg by mouth daily.    [provider]  polyethylene glycol (MIRALAX  / GLYCOLAX ) 17 g packet Take 17 g by mouth daily.    [provider]  prazosin  (MINIPRESS ) 2 MG capsule Take 2 mg by mouth at bedtime. (2100)    [provider]  prazosin  (MINIPRESS ) 2 MG capsule Take 2 mg by mouth at bedtime.    [provider]  Semaglutide  (RYBELSUS ) 3 MG TABS Take 3 mg by mouth daily. Patient not taking: Reported on 11/25/2021 04/28/21   Joshua Debby CROME, MD  Skin Protectants, Misc. (WHITE PETROLATUM -ZINC) 58.3 % cream Apply 1 application. topically at bedtime.    [provider]  solifenacin  (VESICARE ) 5 MG tablet Take 1 tablet (5 mg total) by mouth daily. Patient taking differently: Take 5 mg by mouth at bedtime. 02/27/19   Joshua Debby CROME, MD  Tiotropium Bromide  Monohydrate (SPIRIVA  RESPIMAT) 2.5 MCG/ACT AERS Inhale 2 puffs into the lungs daily. Patient taking differently: Inhale 2 puffs into the lungs daily. (0900) 09/05/20   Joshua Debby CROME, MD  Tiotropium Bromide  Monohydrate (SPIRIVA  RESPIMAT) 2.5 MCG/ACT AERS Inhale 2 each into the lungs daily.    [provider]  traMADol  (ULTRAM ) 50 MG tablet Take 50 mg by mouth 2 (two) times daily. 11/05/21   [provider]  white petrolatum  ointment Apply 1 Application topically at bedtime.    [provider]  zolpidem  (AMBIEN ) 5 MG tablet Take 5  mg by mouth at bedtime. 10/04/21   [provider]  zolpidem  (AMBIEN ) 5 MG tablet Take 5 mg by mouth at bedtime.    [provider]    Allergies: Metformin  and related, Ampicillin, Erythromycin, Metformin , Onion, Penicillins, Topamax  [topiramate ], Ace inhibitors, Erythromycin, Penicillins, and Topiramate     Review of Systems  Musculoskeletal:  Positive for arthralgias.    Updated Vital Signs BP (!) 107/95 (BP Location: Left Arm)   Pulse 98   Temp 98 F (36.7 C) (Oral)   Resp 16   Ht 5' 5 (1.651 m)   Wt 80.3 kg   SpO2 98%   BMI 29.45 kg/m   Physical Exam Vitals  and nursing note reviewed.  Constitutional:      General: She is not in acute distress.    Appearance: She is well-developed. She is not toxic-appearing.  HENT:     Head: Normocephalic and atraumatic.     Right Ear: External ear normal.     Left Ear: External ear normal.  Eyes:     Conjunctiva/sclera: Conjunctivae normal.  Cardiovascular:     Rate and Rhythm: Normal rate and regular rhythm.     Pulses: Normal pulses.     Heart sounds: Murmur heard.  Pulmonary:     Effort: Pulmonary effort is normal. No respiratory distress.     Breath sounds: Normal breath sounds.  Abdominal:     Palpations: Abdomen is soft.     Tenderness: There is no abdominal tenderness.  Musculoskeletal:        General: Swelling present. No tenderness or deformity.     Cervical back: Neck supple.     Right lower leg: No edema.     Left lower leg: No edema.     Comments: Old bruising noted to the left lateral knee.  Patient does have general enlargement of the knee, could be arthritic in nature or swelling from acute injury.  Patient denies any tenderness to palpation but reports lateral and medial left knee pain.  Patient is neurovascularly intact with +2 dorsalis pedis pulses.  Skin:    General: Skin is warm and dry.     Capillary Refill: Capillary refill takes less than 2 seconds.     Findings: Bruising present.   Neurological:     Mental Status: She is alert.  Psychiatric:        Mood and Affect: Mood normal.     (all labs ordered are listed, but only abnormal results are displayed) Labs Reviewed - No data to display  EKG: None  Radiology: DG Knee Complete 4 Views Left Result Date: 08/11/2024 CLINICAL DATA:  Status post fall. EXAM: LEFT KNEE - COMPLETE 4+ VIEW COMPARISON:  None Available. FINDINGS: No evidence of acute fracture, dislocation, or joint effusion. Marked severity medial and lateral marginal osteophyte formation is present with additional degenerative changes seen along the dorsal and volar aspects of the patella marked severity tricompartmental degenerative changes are noted. There is mild suprapatellar and mild infrapatellar soft tissue swelling. IMPRESSION: 1. Marked severity tricompartmental degenerative changes. 2. Mild suprapatellar and mild infrapatellar soft tissue swelling. Electronically Signed   By: Suzen Dials M.D.   On: 08/11/2024 16:16   CT Head Wo Contrast Result Date: 08/11/2024 EXAM: CT HEAD AND CERVICAL SPINE 08/11/2024 04:05:09 PM TECHNIQUE: CT of the head and cervical spine was performed without the administration of intravenous contrast. Multiplanar reformatted images are provided for review. Automated exposure control, iterative reconstruction, and/or weight based adjustment of the mA/kV was utilized to reduce the radiation dose to as low as reasonably achievable. COMPARISON: CT head and CT cervical spine 08/11/2024. CLINICAL HISTORY: Head trauma, minor (Age >= 65y) FINDINGS: CT HEAD BRAIN AND VENTRICLES: No acute intracranial hemorrhage. No mass effect or midline shift. No abnormal extra-axial fluid collection. No evidence of acute infarct. Unchanged position of a right posterior approach shunt catheter with tip in similar position near the foramen of monro. No hydrocephalus. ORBITS: No acute abnormality. SINUSES AND MASTOIDS: No acute abnormality. SOFT TISSUES AND  SKULL: No acute skull fracture. No acute soft tissue abnormality. CT CERVICAL SPINE BONES AND ALIGNMENT: No acute fracture or traumatic malalignment. DEGENERATIVE CHANGES: Moderate multilevel degenerative disc disease  including disc height loss and endplate spurring. SOFT TISSUES: No prevertebral soft tissue swelling. IMPRESSION: 1. No acute intracranial abnormality. 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 2. No acute fracture or traumatic malalignment of the cervical spine. Electronically signed by: Gilmore Molt MD MD 08/11/2024 04:13 PM EST RP Workstation: [id number]   CT Cervical Spine Wo Contrast Result Date: 08/11/2024 EXAM: CT HEAD AND CERVICAL SPINE 08/11/2024 04:05:09 PM TECHNIQUE: CT of the head and cervical spine was performed without the administration of intravenous contrast. Multiplanar reformatted images are provided for review. Automated exposure control, iterative reconstruction, and/or weight based adjustment of the mA/kV was utilized to reduce the radiation dose to as low as reasonably achievable. COMPARISON: CT head and CT cervical spine 08/11/2024. CLINICAL HISTORY: Head trauma, minor (Age >= 65y) FINDINGS: CT HEAD BRAIN AND VENTRICLES: No acute intracranial hemorrhage. No mass effect or midline shift. No abnormal extra-axial fluid collection. No evidence of acute infarct. Unchanged position of a right posterior approach shunt catheter with tip in similar position near the foramen of monro. No hydrocephalus. ORBITS: No acute abnormality. SINUSES AND MASTOIDS: No acute abnormality. SOFT TISSUES AND SKULL: No acute skull fracture. No acute soft tissue abnormality. CT CERVICAL SPINE BONES AND ALIGNMENT: No acute fracture or traumatic malalignment. DEGENERATIVE CHANGES: Moderate multilevel degenerative disc disease including disc height loss and endplate spurring. SOFT TISSUES: No prevertebral soft tissue swelling. IMPRESSION: 1. No acute intracranial abnormality. Stable ventricular  size with vp shunt in place. 2. No acute fracture or traumatic malalignment of the cervical spine. Electronically signed by: Gilmore Molt MD MD 08/11/2024 04:13 PM EST RP Workstation: [id number]     Procedures   Medications Ordered in the ED - No data to display                                  Medical Decision Making Amount and/or Complexity of Data Reviewed Radiology: ordered.   This patient presents to the ED for concern of Fall, knee pain differential diagnosis includes fracture, dislocation, musculoskeletal pain   Imaging Studies ordered:  I ordered imaging studies including CT head and c-spine non con  I independently visualized and interpreted imaging which showed no acute intracranial abnormality.  No acute fracture or traumatic malalignment of the cervical spine I agree with the radiologist interpretation Left knee DG: Mild suprapatellar and mild infrapatellar soft tissue swelling.  Marked severity tricompartmental degenerative changes   Problem List / ED Course:  Considered for admission or further workup however patient's vital signs, physical exam, and imaging are reassuring.  Patient has no red flag signs or symptoms concerning for brain bleed or vertebral fracture.  Patient symptoms likely due to musculoskeletal pain.  Patient advised to alternate Tylenol  as needed for pain.  Patient may also ice affected area.  Patient given return precautions.  I feel patient safe for discharge at this time.        Final diagnoses:  Fall, initial encounter  Acute pain of left knee    ED Discharge Orders     None          Sweet Jarvis N, PA-C 08/11/24 1621    Patsey Lot, MD 08/11/24 2050  "

## 2024-08-27 ENCOUNTER — Encounter (INDEPENDENT_AMBULATORY_CARE_PROVIDER_SITE_OTHER): Payer: Medicare (Managed Care) | Admitting: Ophthalmology

## 2024-09-12 ENCOUNTER — Encounter (INDEPENDENT_AMBULATORY_CARE_PROVIDER_SITE_OTHER): Payer: Medicare (Managed Care) | Admitting: Ophthalmology

## 2024-09-17 ENCOUNTER — Ambulatory Visit: Payer: Medicare (Managed Care) | Admitting: Physician Assistant

## 2024-10-01 ENCOUNTER — Encounter (INDEPENDENT_AMBULATORY_CARE_PROVIDER_SITE_OTHER): Payer: Medicare (Managed Care) | Admitting: Ophthalmology
